# Patient Record
Sex: Female | Born: 1935 | Race: White | Hispanic: No | Smoking: Never smoker
Health system: Southern US, Community
[De-identification: ages and names within clinical notes are randomized; demographics above are authoritative.]

## PROBLEM LIST (undated history)

## (undated) DIAGNOSIS — I5022 Chronic systolic (congestive) heart failure: Secondary | ICD-10-CM

## (undated) DIAGNOSIS — N183 Chronic kidney disease, stage 3 unspecified: Secondary | ICD-10-CM

## (undated) DIAGNOSIS — J45909 Unspecified asthma, uncomplicated: Secondary | ICD-10-CM

## (undated) DIAGNOSIS — I6529 Occlusion and stenosis of unspecified carotid artery: Secondary | ICD-10-CM

## (undated) DIAGNOSIS — I214 Non-ST elevation (NSTEMI) myocardial infarction: Secondary | ICD-10-CM

## (undated) DIAGNOSIS — Z91041 Radiographic dye allergy status: Secondary | ICD-10-CM

## (undated) DIAGNOSIS — K219 Gastro-esophageal reflux disease without esophagitis: Secondary | ICD-10-CM

## (undated) DIAGNOSIS — E039 Hypothyroidism, unspecified: Secondary | ICD-10-CM

## (undated) DIAGNOSIS — R9431 Abnormal electrocardiogram [ECG] [EKG]: Secondary | ICD-10-CM

## (undated) DIAGNOSIS — E119 Type 2 diabetes mellitus without complications: Secondary | ICD-10-CM

## (undated) DIAGNOSIS — I10 Essential (primary) hypertension: Secondary | ICD-10-CM

## (undated) DIAGNOSIS — E785 Hyperlipidemia, unspecified: Secondary | ICD-10-CM

## (undated) DIAGNOSIS — I2510 Atherosclerotic heart disease of native coronary artery without angina pectoris: Secondary | ICD-10-CM

## (undated) DIAGNOSIS — Z9109 Other allergy status, other than to drugs and biological substances: Secondary | ICD-10-CM

## (undated) DIAGNOSIS — H9190 Unspecified hearing loss, unspecified ear: Secondary | ICD-10-CM

## (undated) DIAGNOSIS — Z9861 Coronary angioplasty status: Secondary | ICD-10-CM

## (undated) DIAGNOSIS — I447 Left bundle-branch block, unspecified: Secondary | ICD-10-CM

## (undated) DIAGNOSIS — M109 Gout, unspecified: Secondary | ICD-10-CM

## (undated) DIAGNOSIS — D649 Anemia, unspecified: Secondary | ICD-10-CM

## (undated) DIAGNOSIS — R001 Bradycardia, unspecified: Secondary | ICD-10-CM

## (undated) DIAGNOSIS — G459 Transient cerebral ischemic attack, unspecified: Secondary | ICD-10-CM

## (undated) HISTORY — DX: Essential (primary) hypertension: I10

## (undated) HISTORY — DX: Occlusion and stenosis of unspecified carotid artery: I65.29

## (undated) HISTORY — PX: ABDOMINAL HYSTERECTOMY: SHX81

## (undated) HISTORY — DX: Unspecified asthma, uncomplicated: J45.909

## (undated) HISTORY — DX: Hyperlipidemia, unspecified: E78.5

## (undated) HISTORY — PX: REDUCTION MAMMAPLASTY: SUR839

## (undated) HISTORY — DX: Gout, unspecified: M10.9

## (undated) HISTORY — PX: SHOULDER OPEN ROTATOR CUFF REPAIR: SHX2407

## (undated) HISTORY — PX: JOINT REPLACEMENT: SHX530

---

## 1957-06-25 HISTORY — PX: APPENDECTOMY: SHX54

## 1987-06-26 HISTORY — PX: CHOLECYSTECTOMY: SHX55

## 1989-02-23 DIAGNOSIS — G459 Transient cerebral ischemic attack, unspecified: Secondary | ICD-10-CM

## 1989-02-23 HISTORY — DX: Transient cerebral ischemic attack, unspecified: G45.9

## 2000-07-22 ENCOUNTER — Encounter: Payer: Self-pay | Admitting: Orthopedic Surgery

## 2000-07-31 ENCOUNTER — Inpatient Hospital Stay (HOSPITAL_COMMUNITY): Admission: RE | Admit: 2000-07-31 | Discharge: 2000-08-01 | Payer: Self-pay | Admitting: Orthopedic Surgery

## 2007-06-26 HISTORY — PX: TOTAL KNEE ARTHROPLASTY: SHX125

## 2008-07-26 ENCOUNTER — Inpatient Hospital Stay (HOSPITAL_COMMUNITY): Admission: RE | Admit: 2008-07-26 | Discharge: 2008-07-29 | Payer: Self-pay | Admitting: Orthopedic Surgery

## 2010-10-09 LAB — TYPE AND SCREEN: ABO/RH(D): O NEG

## 2010-10-09 LAB — CBC
HCT: 36.1 % (ref 36.0–46.0)
Hemoglobin: 11.8 g/dL — ABNORMAL LOW (ref 12.0–15.0)
MCHC: 32.5 g/dL (ref 30.0–36.0)
MCV: 92 fL (ref 78.0–100.0)
Platelets: 222 10*3/uL (ref 150–400)
RBC: 3.93 MIL/uL (ref 3.87–5.11)
RDW: 13.6 % (ref 11.5–15.5)
WBC: 6.4 10*3/uL (ref 4.0–10.5)

## 2010-10-09 LAB — URINALYSIS, ROUTINE W REFLEX MICROSCOPIC
Glucose, UA: NEGATIVE mg/dL
Ketones, ur: NEGATIVE mg/dL
Nitrite: NEGATIVE
Protein, ur: NEGATIVE mg/dL
pH: 5.5 (ref 5.0–8.0)

## 2010-10-09 LAB — COMPREHENSIVE METABOLIC PANEL
ALT: 18 U/L (ref 0–35)
AST: 25 U/L (ref 0–37)
Albumin: 4 g/dL (ref 3.5–5.2)
Alkaline Phosphatase: 96 U/L (ref 39–117)
BUN: 19 mg/dL (ref 6–23)
CO2: 23 mEq/L (ref 19–32)
Calcium: 9.7 mg/dL (ref 8.4–10.5)
Chloride: 105 mEq/L (ref 96–112)
Creatinine, Ser: 0.72 mg/dL (ref 0.4–1.2)
GFR calc Af Amer: >60 mL/min (ref >60)
GFR calc non Af Amer: >60 mL/min (ref >60)
Glucose, Bld: 71 mg/dL (ref 70–99)
Potassium: 4.2 mEq/L (ref 3.5–5.1)
Sodium: 139 mEq/L (ref 135–145)
Total Bilirubin: 0.5 mg/dL (ref 0.3–1.2)
Total Protein: 6.7 g/dL (ref 6.0–8.3)

## 2010-10-09 LAB — DIFFERENTIAL
Basophils Absolute: 0 10*3/uL (ref 0.0–0.1)
Basophils Relative: 0 % (ref 0–1)
Eosinophils Absolute: 0.1 10*3/uL (ref 0.0–0.7)
Eosinophils Relative: 1 % (ref 0–5)
Lymphocytes Relative: 29 % (ref 12–46)
Lymphs Abs: 1.9 10*3/uL (ref 0.7–4.0)
Monocytes Absolute: 0.5 10*3/uL (ref 0.1–1.0)
Monocytes Relative: 8 % (ref 3–12)
Neutro Abs: 4 10*3/uL (ref 1.7–7.7)
Neutrophils Relative %: 62 % (ref 43–77)

## 2010-10-09 LAB — APTT: aPTT: 29 seconds (ref 24–37)

## 2010-10-09 LAB — PROTIME-INR
INR: 0.9 (ref 0.00–1.49)
Prothrombin Time: 12.5 seconds (ref 11.6–15.2)

## 2010-10-10 LAB — GLUCOSE, CAPILLARY
Glucose-Capillary: 172 mg/dL — ABNORMAL HIGH (ref 70–99)
Glucose-Capillary: 177 mg/dL — ABNORMAL HIGH (ref 70–99)
Glucose-Capillary: 182 mg/dL — ABNORMAL HIGH (ref 70–99)
Glucose-Capillary: 190 mg/dL — ABNORMAL HIGH (ref 70–99)
Glucose-Capillary: 211 mg/dL — ABNORMAL HIGH (ref 70–99)
Glucose-Capillary: 217 mg/dL — ABNORMAL HIGH (ref 70–99)
Glucose-Capillary: 219 mg/dL — ABNORMAL HIGH (ref 70–99)
Glucose-Capillary: 244 mg/dL — ABNORMAL HIGH (ref 70–99)

## 2010-10-10 LAB — CBC
HCT: 28.1 % — ABNORMAL LOW (ref 36.0–46.0)
Hemoglobin: 9.9 g/dL — ABNORMAL LOW (ref 12.0–15.0)
MCV: 90.8 fL (ref 78.0–100.0)
Platelets: 167 10*3/uL (ref 150–400)
Platelets: 181 10*3/uL (ref 150–400)
RBC: 3.06 MIL/uL — ABNORMAL LOW (ref 3.87–5.11)
RBC: 3.12 MIL/uL — ABNORMAL LOW (ref 3.87–5.11)
RBC: 3.19 MIL/uL — ABNORMAL LOW (ref 3.87–5.11)
RDW: 13.4 % (ref 11.5–15.5)
WBC: 6.5 10*3/uL (ref 4.0–10.5)
WBC: 7 10*3/uL (ref 4.0–10.5)
WBC: 8 10*3/uL (ref 4.0–10.5)

## 2010-10-10 LAB — BASIC METABOLIC PANEL
BUN: 10 mg/dL (ref 6–23)
BUN: 11 mg/dL (ref 6–23)
Calcium: 8.6 mg/dL (ref 8.4–10.5)
Calcium: 8.7 mg/dL (ref 8.4–10.5)
Chloride: 106 mEq/L (ref 96–112)
Creatinine, Ser: 0.65 mg/dL (ref 0.4–1.2)
Creatinine, Ser: 0.69 mg/dL (ref 0.4–1.2)
GFR calc Af Amer: >60 mL/min (ref >60)
GFR calc Af Amer: >60 mL/min (ref >60)
GFR calc Af Amer: >60 mL/min (ref >60)
GFR calc non Af Amer: >60 mL/min (ref >60)
GFR calc non Af Amer: >60 mL/min (ref >60)
GFR calc non Af Amer: >60 mL/min (ref >60)
Glucose, Bld: 111 mg/dL — ABNORMAL HIGH (ref 70–99)
Potassium: 4.2 mEq/L (ref 3.5–5.1)
Sodium: 139 mEq/L (ref 135–145)

## 2010-10-10 LAB — CROSSMATCH

## 2010-10-10 LAB — HEMOGLOBIN A1C
Hgb A1c MFr Bld: 7.5 % — ABNORMAL HIGH (ref 4.6–6.1)
Mean Plasma Glucose: 169 mg/dL

## 2010-11-07 NOTE — Op Note (Signed)
Carol [name], Carol [name] NO.:  [id number]   MEDICAL RECORD NO.:  [id number]          PATIENT TYPE:  INP   LOCATION:  5005                         FACILITY:  [facility]   PHYSICIAN:  Estill Bamberg. Ronnie Derby, M.D. DATE OF BIRTH:  18-Apr-1936   DATE OF PROCEDURE:  07/26/2008  DATE OF DISCHARGE:                               OPERATIVE REPORT   SURGEON:  Estill Bamberg. Ronnie Derby, MD   ASSISTANTS:  1. Carlynn Spry, PA-C  2. Lowell Guitar. Mancel Bale, PA-C   ANESTHESIA:  General.   PREOPERATIVE DIAGNOSIS:  Right knee osteoarthritis.   POSTOPERATIVE DIAGNOSIS:  Right knee osteoarthritis.   PROCEDURE:  Right total knee arthroplasty.   INDICATIONS FOR PROCEDURE:  The patient is a 75 year old white female  with failure of conservative measures for osteoarthritis of the right  knee.  Informed consent was obtained.   DESCRIPTION OF PROCEDURE:  The patient was laid supine and administered  general anesthesia and Foley catheter placement.  Preoperative nerve  block was done preoperatively.  The extremity was exsanguinated with the  Esmarch and tourniquet inflated to 350 mmHg after sterile prep and  drape.  A midline incision was made with a #10 blade.  New blade was  used to make a medial parapatellar arthrotomy to perform synovectomy.  Elevated the deep MCL off the medial crest of the tibia but did not  extend down to the pes anserine tendon consistent with a valgus knee.  I  everted the patella and measured 22.5 mm thick.  I reamed down 8.5 mm  and drilled through lug holes with the 32-mm template.  I recreated the  22.5-mm thickness.  Then, with the trial component went into flexion  subluxing the patella laterally.  I used the intramedullary alignment  system on the tibia.  I had to make a perpendicular cut to the anatomic  axis.  I made the cut with a sagittal saw.  I then used intramedullary  system on the distal femur and made a 4 degree valgus cut to the valgus  knee and a deficient  hypoplastic lateral femoral condyle.  I then drew  out the epicondylar axis and posterior condylar angle measured 7  degrees.  I sized to a size E and pinned through the 7 degree external  rotation holes.  I made the anterior-posterior chamfer cuts with a  sagittal saw.  I then removed the pieces down with a cutting block.  I  placed a lamina spreader  in the knee and removed the ACL, PCL, medial  and lateral menisci and posterior condylar osteophytes.  I then used 10-  12 spacer blocks and there was tightness on the lateral side, so I put  the leg in extension and performed pie-crusting of the lateral  structures with a 15 blade and a long handle.  I then obtained  flexion/extension gap balance with 12-mm spacer blocks.  I then finished  the femur with a size E finishing block, finished the tibia with a size  5 tibial tray drilling keel.  I then trialed with an E femur with a 5  tibia, 12 insert  and 32 patella which tracked well.  I removed the trial  components and copiously irrigated.  I cemented the components removing  excess cement allowing the cement to harden in extension.  I then left  Hemovac coming out superolaterally deep to the arthrotomy, pain catheter  coming out superomedially and superficially to the arthrotomy.  Closed  the arthrotomy with a figure-of-eight #1Vicryl sutures after letting the  tourniquet down to 55 minutes and obtaining hemostasis.  I irrigated  again and then closed the arthrotomy with figure-of-eight #1 Vicryl  sutures, 0 Vicryl sutures in the deep soft tissues and a subcuticular 2-  0 Vicryl stitch, then subcuticular 3-0 Vicryl stitch and skin staples.  Dressed with Xeroform dressing sponges, sterile Webril, and TED  stocking.   COMPLICATIONS:  None.   DRAINS:  One Hemovac and one pain catheter.   ESTIMATED BLOOD LOSS:  300 mL.   TOURNIQUET TIME:  55 minutes.           ______________________________  Estill Bamberg Ronnie Derby, M.D.     SDL/MEDQ  D:   07/26/2008  T:  07/26/2008  Job:  [id number]
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
## 2010-11-10 NOTE — Discharge Summary (Signed)
NAMEPAYTIENCE, STRITTMATTER NO.:  [id number]   MEDICAL RECORD NO.:  [id number]          PATIENT TYPE:  INP   LOCATION:  5005                         FACILITY:  [facility]   PHYSICIAN:  Estill Bamberg. Ronnie Derby, M.D. DATE OF BIRTH:  [birth year]   DATE OF ADMISSION:  07/26/2008  DATE OF DISCHARGE:  07/29/2008                               DISCHARGE SUMMARY   ADMISSION DIAGNOSIS:  Right knee osteoarthritis.   DISCHARGE DIAGNOSES:  1. Right knee osteoarthritis.  2. Status post right knee arthroplasty.  3. Acute blood loss anemia, status post surgery.  4. Diabetes.  5. Hypertension.  6. Thyroid.  7. Hyperlipidemia.   PROCEDURE:  Right total knee arthroplasty.   HISTORY:  The patient is a 75 year old female complaining of pain in her  right knee times every day.  The patient states the pain is constant and  severe, and is not interfering with activities of daily living.  Conservative treatment has failed.  Risks and benefits of surgery were  discussed and the patient would like to proceed with the right total  knee arthroplasty.   ALLERGIES:  The patient has no known drug allergies.   MEDICATIONS PRIOR TO ADMISSION:  The patient was on:  1. Armour Thyroid 30 mg daily.  2. Metformin 500 mg daily.  3. Colchicine 0.6 three times a day.  4. ProAir.  5. Glyburide and metformin 5/500 twice a day.  6. Lisinopril and hydrochlorothiazide 20/25 daily.  7. Allopurinol 300 mg daily.  8. Fludrocortisone 15 mcg daily.  9. Fish oil daily.  10.Magnesium daily.  11.Vitamin D.  12.Vitamin E.  13.Vitamin C daily.   HOSPITAL COURSE:  This is a 75 year old female admitted on July 26, 2008, after appropriate laboratory studies were obtained preoperatively  as well as Ancef on-call to the operating room, she was taken to the OR  where she underwent a right TKA.  She tolerated the procedure well and  was taken to the PACU in good condition.  She was started on p.o. pain  medication as well  as IV Dilaudid.  Foley was placed intraoperatively.   On postop day #1, vital signs were stable.  The patient denied chest  pain, short of breath, or calf pain; did complaints of nausea at the  night of surgery, but stated resolving the next morning.  The patient  was started on Lovenox 30 mg subcu q.12 h. at 8:00 a.m.  Consults for  PT, OT, and Care Management were made.  The patient is weightbearing as  tolerated.  CPM was 0-9 degrees 16 hours per day.  Incentive spirometry  teaching was done.   On postop day #2, the patient continued to progress with physical  therapy.  Dressing was changed.  Marcaine pump and Hemovac was  discontinued.  Foley was discontinued.  The patient continued on p.o.  pain medication.  The patient was feeling dizzy with physical therapy  and H and H was 9.2 and 28.1.  She was given 2 units of PRBC.   On postop day #3, the patient continued progressive physical therapy and  was asymptomatic and  was discharged home after Lovenox teaching.   LABORATORY STUDIES:  Upon admission, the patient's white blood cell  count was 6.44, H and H was 11.8 and 36.1, and platelets were 222.  Sodium was 139, potassium was 4.2, chloride was 105, CO2 of 23, glucose  71, BUN was 19, and creatinine is 0.72.  On discharge, the patient's  white blood cell count was 6.5, H and H is 9.9 and 28.9, and platelets  were 144.  Sodium was 139, potassium of 3.5, chloride was 104, CO2 of  28, glucose was 111, BUN was 11, and creatinine was 0.6 a day.   DISCHARGE INSTRUCTIONS:  There are no restrictions to diet.  The patient  is to follow with blue instruction sheet for wound care and increase  activity slowly.  May use a cane or walker.  Weightbearing as tolerated.  No lifting or driving for 6 weeks.  Home Health will start day after the  patient leaves the hospital.  CPM 0-90 degrees for 6-8 hours per day x2  weeks.  The patient is to wear bilateral thigh-high compression  stockings x3  weeks.   DISCHARGE MEDICATION:  The patient was given prescriptions for:  1. Lovenox 40 mg, inject once subcutaneously daily; last dose August 09, 2008.  2. Robaxin 500 mg 1-2 tablets every 6 hours as needed for spasm, #60.  3. Oxycodone 5 mg 1-2 tablets every 4-6 hours as needed for pain, #60.   FOLLOWUP:  The patient will follow up with Dr. Ronnie Derby on August 09, 2008, call for appointment [phone].   CONDITION:  Discharged in improved condition.     ______________________________  Carlynn Spry, PA-C    ______________________________  Estill Bamberg. Ronnie Derby, M.D.    MJ/MEDQ  D:  09/03/2008  T:  09/03/2008  Job:  [id number]

## 2010-11-10 NOTE — Op Note (Signed)
[facility]  Patient:    Carol [name], Carol [name]                     MRN: [id number] Proc. Date: 07/31/00 Adm. Date:  [id number] Disc. Date: [id number] Attending:  Tarri Glenn Page                           Operative Report  PREOPERATIVE DIAGNOSES: 1. Degenerative arthritis acromioclavicular joint. 2. Torn rotator cuff, right shoulder.  POSTOPERATIVE DIAGNOSES: 1. Degenerative arthritis acromioclavicular joint. 2. Torn rotator cuff, right shoulder.  OPERATION: 1. Open resection distal right clavicle. 2. Anterior acromionectomy repair of torn rotator cuff, right shoulder.  SURGEON:  Laurice Record. Aplington, M.D.  ASSISTANT:  Peter Congo, P.A.  ANESTHESIA:  General.  PATHOLOGY AND JUSTIFICATION FOR PROCEDURE:  Painful right shoulder with arthritic changes to the [facility] joint on x-ray and tenderness at this joint on exam.  Also, on MRI, she had retracted tear of the rotator cuff which at surgery was about 2 cm in width with 1-2 cm of retraction, but it was flexible.  DESCRIPTION OF PROCEDURE:  Prophylactic antibiotics, satisfactory general anesthesia, beach chair position on the Schlein frame.  Right shoulder girdle was prepped with DuraPrep and draped in a sterile field.  Ioban employed. Hockey stick type incision with a vertical component up to about the [facility] joint and then curving gently over the distal clavicle.  Incision was carried down the fascia over the distal clavicle, the [facility] joint, and the anterior acromion, all of which was opened in line with the skin incision from the cutting cautery.  The [facility] joint was identified and was markedly arthritic.  I marked off 2 cm of the lateral clavicle and then undermined the clavicle at this point and protecting the underlying structures, used the microsaw to make my osteotomy.  I then grasped the distal clavicle with towel clip and removed it with Bovie dissection.  I then checked to make that no bony spicules  remaining on the resected end and then covered this with bone wax.  I then continued dissection over the anterior acromion, removing fascia at the [facility] joint and then undermined the anterior acromion with a small Cobb elevator with joint fluid coming forth, confirming the tear.  After I had made my initial exposure, I then placed a large Cobb elevator beneath the anterior acromion and marked out my initial site of the anterior acromionectomy with the Bovie and used the microsaw to make the initial cut.  I then removed additional underneath surface acromion to divide a nice subacromial decompression.  The rotator cuff tear was then identified.  The long end of the biceps tendon was intact.  The tear was at the greater tuberosity.  I made a bony trough with 0.25 inch curved osteotome and small rongeur and then made two central drill holes through the lateral bony buttress into the trough and placed my initial #1 Tycron horizontal mattress suture through these too.  I then supplemented this with horizontal mattress sutures both anteriorly and posteriorly, using the original holes but also coming through rotator cuff anteriorly and posteriorly.  With the arm abducted, I was able to secure the three sutures, and this gave a nice, stable repair.  I then oversewed the terminal end of the rotator cuff with multiple interrupted #1 Vicryl, further stabilizing and smoothing down the repair.  I then checked to make sure there was no residual impingement.  The wound was irrigated with sterile saline and infiltrated with 0.5% Marcaine with adrenalin.  Gelfoam was placed in the resected portion of the distal clavicle.  The fascia over the distal clavicle, the defect, and the anterior acromion, as well as the deltoid muscle were all closed with interrupted #1 Vicryl with a nice, tight closure.  The  subcutaneous tissue with 2-0 Vicryl deep, 4-0 Vicryl superficially with Steri-Strips on the skin. Dry sterile  dressing and shoulder immobilizer were applied.  She tolerated the procedure well and was taken to the recovery room in satisfactory condition with no known complications. DD:  07/31/00 TD:  08/01/00 Job: [id number]

## 2011-06-26 HISTORY — PX: CATARACT EXTRACTION W/ INTRAOCULAR LENS  IMPLANT, BILATERAL: SHX1307

## 2012-12-05 DIAGNOSIS — E119 Type 2 diabetes mellitus without complications: Secondary | ICD-10-CM

## 2012-12-05 DIAGNOSIS — H908 Mixed conductive and sensorineural hearing loss, unspecified: Secondary | ICD-10-CM | POA: Insufficient documentation

## 2012-12-05 DIAGNOSIS — R079 Chest pain, unspecified: Secondary | ICD-10-CM

## 2012-12-05 DIAGNOSIS — E875 Hyperkalemia: Secondary | ICD-10-CM | POA: Insufficient documentation

## 2012-12-05 DIAGNOSIS — M545 Low back pain: Secondary | ICD-10-CM | POA: Insufficient documentation

## 2012-12-05 DIAGNOSIS — E039 Hypothyroidism, unspecified: Secondary | ICD-10-CM | POA: Insufficient documentation

## 2012-12-05 DIAGNOSIS — I10 Essential (primary) hypertension: Secondary | ICD-10-CM | POA: Insufficient documentation

## 2012-12-05 DIAGNOSIS — R0602 Shortness of breath: Secondary | ICD-10-CM

## 2012-12-05 DIAGNOSIS — M109 Gout, unspecified: Secondary | ICD-10-CM | POA: Insufficient documentation

## 2012-12-08 ENCOUNTER — Encounter: Payer: Self-pay | Admitting: Cardiovascular Disease

## 2012-12-08 ENCOUNTER — Ambulatory Visit (INDEPENDENT_AMBULATORY_CARE_PROVIDER_SITE_OTHER): Payer: Medicare Other | Admitting: Cardiovascular Disease

## 2012-12-08 VITALS — BP 173/90 | HR 82 | Ht 66.5 in | Wt 175.8 lb

## 2012-12-08 DIAGNOSIS — R079 Chest pain, unspecified: Secondary | ICD-10-CM

## 2012-12-08 NOTE — Patient Instructions (Addendum)
Your physician recommends that you schedule a follow-up appointment in: about 6 weeks.   Your physician has requested that you have an echocardiogram. Echocardiography is a painless test that uses sound waves to create images of your heart. It provides your doctor with information about the size and shape of your heart and how well your heart's chambers and valves are working. This procedure takes approximately one hour. There are no restrictions for this procedure.   Your physician has requested that you have an exercise stress myoview. For further information please visit [email or web address]. Please follow instruction sheet, as given.

## 2012-12-08 NOTE — Progress Notes (Signed)
History of Present Illness: 77 yo female with history of DM, HTN, asthma, hyperthyroidism, HLD who is referred today as a new patient for evaluation of chest pain. 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 Also exposed to smoke but never a primary smoker. She describes chest pressure and dyspnea with walking over last six months. This happens with minimal exertion/walking and resolves with rest. No prior cardiac disease. Normal ABI per report in a cardiology office near [place].   Primary Care Physician: Reinaldo Meeker  Past Medical History  Diagnosis Date  . Hypertension   . Diabetes mellitus without complication   . Asthma   . Hyperthyroidism   . Hyperthyroidism   . Hyperlipidemia   . Gout, unspecified   . Hx of knee surgery   . Chest pain     angina    Past Surgical History  Procedure Laterality Date  . Cholecystectomy    . Rotator cuff repair Bilateral   . Total knee arthroplasty Right   . Cesarean section    . Abdominal hysterectomy      Current Outpatient Prescriptions  Medication Sig Dispense Refill  . allopurinol (ZYLOPRIM) 300 MG tablet Take 300 mg by mouth daily.      Marland Kitchen amLODipine (NORVASC) 5 MG tablet Take 5 mg by mouth daily.      . Biotin 5000 MCG CAPS Take by mouth.      . Cholecalciferol (VITAMIN D-3 PO) Take 2,000 Units by mouth daily.      . colchicine 0.6 MG tablet Take 0.6 mg by mouth 2 (two) times daily.      Marland Kitchen glipiZIDE (GLUCOTROL) 5 MG tablet Take 5 mg by mouth daily. Take one 5mg  tab once a day for 90 days (10/14/2012)      . glucose blood test strip 1 each by Other route daily. Use as instructed      . lisinopril (PRINIVIL,ZESTRIL) 20 MG tablet Take 20 mg by mouth daily.      . metFORMIN (GLUCOPHAGE) 1000 MG tablet Take 1,000 mg by mouth 2 (two) times daily.       . Multiple Vitamins-Minerals (MULTIVITAMIN PO) Take by mouth daily.      . nitroGLYCERIN (NITROSTAT) 0.4 MG SL tablet Place 0.4 mg under  the tongue every 5 (five) minutes as needed for chest pain.      Marland Kitchen thyroid (ARMOUR) 60 MG tablet Take 120 mg by mouth daily. Take two tabs once a day for 90 days (09/04/2012)      . vitamin B-12 (CYANOCOBALAMIN) 1000 MCG tablet Take 1,000 mcg by mouth daily.       No current facility-administered medications for this visit.    Allergies  Allergen Reactions  . Contrast Media (Iodinated Diagnostic Agents)   . Cortizone-10 (Hydrocortisone)     History   Social History  . Marital Status: Married    Spouse Name: N/A    Number of Children: 2  . Years of Education: N/A   Occupational History  . Retired    Social History Main Topics  . Smoking status: Never Smoker   . Smokeless tobacco: Not on file  . Alcohol Use: No  . Drug Use: No  . Sexually Active: No   Other Topics Concern  . Not on file   Social History Narrative  . No narrative on file    Family History  Problem Relation Age of Onset  . Hypertension Mother   .  Diabetes Mother   . Heart attack Mother 23    Review of Systems:  As stated in the HPI and otherwise negative.   BP 173/90  Pulse 82  Ht 5' 6.5" (1.689 m)  Wt 175 lb 12.8 oz (79.742 kg)  BMI 27.95 kg/m2  Physical Examination: General: Well developed, well nourished, NAD HEENT: OP clear, mucus membranes moist SKIN: warm, dry. No rashes. Neuro: No focal deficits Musculoskeletal: Muscle strength 5/5 all ext Psychiatric: Mood and affect normal Neck: No JVD, no carotid bruits, no thyromegaly, no lymphadenopathy. Lungs:Clear bilaterally, no wheezes, rhonci, crackles Cardiovascular: Regular rate and rhythm. No murmurs, gallops or rubs. Abdomen:Soft. Bowel sounds present. Non-tender.  Extremities: No lower extremity edema. Pulses are 2 + in the bilateral DP/PT.  EKG: NSR, rate 79 bpm. Normal EKG  Assessment and Plan:   1. Chest pain: No prior cardiac disease. Risk factors for CAD include age, FH of CAD, DM, HTN, HLD. Concerning for angina.  Will  arrange exercise stress myoview to exclude ischemia. Echo to assess LVEF and exclude valvular disease.

## 2012-12-17 ENCOUNTER — Ambulatory Visit (HOSPITAL_COMMUNITY): Payer: Medicare Other | Attending: Cardiovascular Disease | Admitting: Radiology

## 2012-12-17 DIAGNOSIS — E785 Hyperlipidemia, unspecified: Secondary | ICD-10-CM | POA: Insufficient documentation

## 2012-12-17 DIAGNOSIS — R072 Precordial pain: Secondary | ICD-10-CM | POA: Insufficient documentation

## 2012-12-17 DIAGNOSIS — E119 Type 2 diabetes mellitus without complications: Secondary | ICD-10-CM | POA: Insufficient documentation

## 2012-12-17 DIAGNOSIS — R079 Chest pain, unspecified: Secondary | ICD-10-CM

## 2012-12-17 DIAGNOSIS — I10 Essential (primary) hypertension: Secondary | ICD-10-CM | POA: Insufficient documentation

## 2012-12-17 DIAGNOSIS — I079 Rheumatic tricuspid valve disease, unspecified: Secondary | ICD-10-CM | POA: Insufficient documentation

## 2012-12-17 DIAGNOSIS — I059 Rheumatic mitral valve disease, unspecified: Secondary | ICD-10-CM | POA: Insufficient documentation

## 2012-12-17 DIAGNOSIS — J45909 Unspecified asthma, uncomplicated: Secondary | ICD-10-CM | POA: Insufficient documentation

## 2012-12-17 NOTE — Progress Notes (Signed)
Echo Report Faxed to Dr. Reinaldo Meeker

## 2012-12-17 NOTE — Progress Notes (Signed)
Echocardiogram performed.  

## 2012-12-22 ENCOUNTER — Encounter (HOSPITAL_COMMUNITY): Payer: Medicare Other

## 2012-12-24 ENCOUNTER — Ambulatory Visit (HOSPITAL_COMMUNITY): Payer: Medicare Other | Attending: Cardiology | Admitting: Radiology

## 2012-12-24 VITALS — BP 155/83 | HR 79 | Ht 65.0 in | Wt 196.0 lb

## 2012-12-24 DIAGNOSIS — R5383 Other fatigue: Secondary | ICD-10-CM

## 2012-12-24 DIAGNOSIS — R079 Chest pain, unspecified: Secondary | ICD-10-CM

## 2012-12-24 DIAGNOSIS — R0602 Shortness of breath: Secondary | ICD-10-CM | POA: Insufficient documentation

## 2012-12-24 DIAGNOSIS — R5381 Other malaise: Secondary | ICD-10-CM | POA: Insufficient documentation

## 2012-12-24 DIAGNOSIS — R0609 Other forms of dyspnea: Secondary | ICD-10-CM | POA: Insufficient documentation

## 2012-12-24 DIAGNOSIS — I10 Essential (primary) hypertension: Secondary | ICD-10-CM | POA: Insufficient documentation

## 2012-12-24 DIAGNOSIS — E785 Hyperlipidemia, unspecified: Secondary | ICD-10-CM | POA: Insufficient documentation

## 2012-12-24 DIAGNOSIS — Z8249 Family history of ischemic heart disease and other diseases of the circulatory system: Secondary | ICD-10-CM | POA: Insufficient documentation

## 2012-12-24 DIAGNOSIS — R51 Headache: Secondary | ICD-10-CM

## 2012-12-24 DIAGNOSIS — E119 Type 2 diabetes mellitus without complications: Secondary | ICD-10-CM | POA: Insufficient documentation

## 2012-12-24 DIAGNOSIS — R0789 Other chest pain: Secondary | ICD-10-CM | POA: Insufficient documentation

## 2012-12-24 DIAGNOSIS — R61 Generalized hyperhidrosis: Secondary | ICD-10-CM | POA: Insufficient documentation

## 2012-12-24 DIAGNOSIS — R0989 Other specified symptoms and signs involving the circulatory and respiratory systems: Secondary | ICD-10-CM | POA: Insufficient documentation

## 2012-12-24 MED ORDER — TECHNETIUM TC 99M SESTAMIBI GENERIC - CARDIOLITE
10.0000 | Freq: Once | INTRAVENOUS | Status: AC | PRN
  Administered 2012-12-24: 10 via INTRAVENOUS

## 2012-12-24 MED ORDER — TECHNETIUM TC 99M SESTAMIBI GENERIC - CARDIOLITE
30.0000 | Freq: Once | INTRAVENOUS | Status: AC | PRN
  Administered 2012-12-24: 30 via INTRAVENOUS

## 2012-12-24 MED ORDER — AMINOPHYLLINE 25 MG/ML IV SOLN
75.0000 mg | Freq: Once | INTRAVENOUS | Status: AC
  Administered 2012-12-24: 75 mg via INTRAVENOUS

## 2012-12-24 MED ORDER — REGADENOSON 0.4 MG/5ML IV SOLN
0.4000 mg | Freq: Once | INTRAVENOUS | Status: AC
  Administered 2012-12-24: 0.4 mg via INTRAVENOUS

## 2012-12-24 NOTE — Progress Notes (Signed)
[facility] [place], [place] [address] [phone]    Cardiology Nuclear Med Study  Carol [name] is a 77 y.o. female     MRN : [id number]     DOB: [birth year]  Procedure Date: 12/24/2012  Nuclear Med Background Indication for Stress Test:  Evaluation for Ischemia History: No prior known history of CAD, 6-7 yrs ago GXT: Normal per patient; 11-2012 Echo: EF=55-60% Cardiac Risk Factors: Family History - CAD, Hypertension, Lipids and NIDDM  Symptoms: Chest Pain/Pressure with/without exertion (last occurrence 1 month ago),  Diaphoresis, DOE, Fatigue, Fatigue with Exertion and SOB   Nuclear Pre-Procedure Caffeine/Decaff Intake:  None NPO After: 7:00am   Lungs:  clear O2 Sat: 97% on room air. IV 0.9% NS with Angio Cath:  22g  IV Site: R Hand  IV Started by:  Crissie Figures, RN  Chest Size (in):  44 Cup Size: D  Height: 5\' 5"  (1.651 m)  Weight:  196 lb (88.905 kg)  BMI:  Body mass index is 32.62 kg/(m^2). Tech Comments:  N/A    Nuclear Med Study 1 or 2 day study: 1 day  Stress Test Type:  Lexiscan  Reading MD: [name], MD  Order Authorizing Provider:  Lauree Chandler, MD  Resting Radionuclide: Technetium 57m Sestamibi  Resting Radionuclide Dose: 11.0 mCi   Stress Radionuclide:  Technetium 68m Sestamibi  Stress Radionuclide Dose: 33.0 mCi           Stress Protocol Rest HR: 79 Stress HR: 108  Rest BP: 155/83 Stress BP: 207/66  Exercise Time (min): n/a METS: n/a   Predicted Max HR: 146 bpm % Max HR: 73.97 bpm Rate Pressure Product: 22356   Dose of Adenosine (mg):  n/a Dose of Lexiscan: 0.4 mg  Dose of Atropine (mg): n/a Dose of Dobutamine: n/a mcg/kg/min (at max HR)  Stress Test Technologist: Irven Baltimore, RN  Nuclear Technologist:  Vedia Pereyra, CNMT     Rest Procedure:  Myocardial perfusion imaging was performed at rest 45 minutes following the intravenous administration of Technetium 53m Sestamibi. Rest ECG: NSR  - Normal EKG  Stress Procedure:  The patient received IV Lexiscan 0.4 mg over 15-seconds.  Technetium 85m Sestamibi injected at 30-seconds. The patient complained of chest pain, SOB, exhausted, and headache with Lexiscan. Aminophylline 75 mg IVP given 7 minutes in recovery with resolution of symptoms.  Quantitative spect images were obtained after a 45 minute delay. Stress ECG: No significant change from baseline ECG  QPS Raw Data Images:  Normal; no motion artifact; normal heart/lung ratio. Stress Images:  Normal homogeneous uptake in all areas of the myocardium. Rest Images:  Normal homogeneous uptake in all areas of the myocardium. Subtraction (SDS):  There is no evidence of scar or ischemia. Transient Ischemic Dilatation (Normal <1.22):  NA Lung/Heart Ratio (Normal <0.45):  0.33  Quantitative Gated Spect Images QGS EDV:  73 ml QGS ESV:  19 ml  Impression Exercise Capacity:  Lexiscan with no exercise. BP Response:  Hypertensive blood pressure response. Clinical Symptoms:  Shortness of breath.  ECG Impression:  No significant ST segment change suggestive of ischemia. Comparison with Prior Nuclear Study: No previous nuclear study performed  Overall Impression:  Normal stress nuclear study.  LV Ejection Fraction: 75%.  LV Wall Motion:  NL LV Function; NL Wall Motion  [name] 12/24/2012

## 2013-01-21 ENCOUNTER — Ambulatory Visit (INDEPENDENT_AMBULATORY_CARE_PROVIDER_SITE_OTHER): Payer: Medicare Other | Admitting: Cardiovascular Disease

## 2013-01-21 ENCOUNTER — Encounter: Payer: Self-pay | Admitting: Cardiovascular Disease

## 2013-01-21 VITALS — BP 162/88 | HR 88 | Ht 67.5 in | Wt 199.4 lb

## 2013-01-21 DIAGNOSIS — R079 Chest pain, unspecified: Secondary | ICD-10-CM

## 2013-01-21 NOTE — Progress Notes (Signed)
History of Present Illness: 77 yo female with history of DM, HTN, asthma, hyperthyroidism, HLD who is here today for cardiac follow up. I saw her June 2014 as a new patient for evaluation of chest pain. 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 Also exposed to smoke but never a primary smoker. She described chest pressure and dyspnea with walking over last six months. This happens with minimal exertion/walking and resolves with rest. No prior cardiac disease. Normal ABI per report in a cardiology office near [place]. I arranged a stress myoview 12/24/12 which showed normal LV function and no evidence of ischemia. Echo 12/17/12 overall normal.   She is here today for follow up. She has no exertional chest pain. She continues to have cough, burning in her epigastrium with deep breaths.   Primary Care Physician: Reinaldo Meeker  Past Medical History  Diagnosis Date  . Hypertension   . Diabetes mellitus without complication   . Asthma   . Hyperthyroidism   . Hyperthyroidism   . Hyperlipidemia   . Gout, unspecified   . Hx of knee surgery   . Chest pain     angina    Past Surgical History  Procedure Laterality Date  . Cholecystectomy    . Rotator cuff repair Bilateral   . Total knee arthroplasty Right   . Cesarean section    . Abdominal hysterectomy      Current Outpatient Prescriptions  Medication Sig Dispense Refill  . allopurinol (ZYLOPRIM) 300 MG tablet Take 300 mg by mouth daily.      Marland Kitchen amLODipine (NORVASC) 5 MG tablet Take 5 mg by mouth daily.      . Biotin 5000 MCG CAPS Take by mouth.      . Cholecalciferol (VITAMIN D-3 PO) Take 2,000 Units by mouth daily.      . colchicine 0.6 MG tablet Take 0.6 mg by mouth 2 (two) times daily.      Marland Kitchen glipiZIDE (GLUCOTROL) 5 MG tablet Take 5 mg by mouth daily. Take one 5mg  tab once a day for 90 days (10/14/2012)      . glucose blood test strip 1 each by Other route daily. Use as instructed       . lisinopril (PRINIVIL,ZESTRIL) 20 MG tablet Take 20 mg by mouth daily.      . metFORMIN (GLUCOPHAGE) 1000 MG tablet Take 1,000 mg by mouth 2 (two) times daily.       . Multiple Vitamins-Minerals (MULTIVITAMIN PO) Take by mouth daily.      . nitroGLYCERIN (NITROSTAT) 0.4 MG SL tablet Place 0.4 mg under the tongue every 5 (five) minutes as needed for chest pain.      Marland Kitchen thyroid (ARMOUR) 60 MG tablet Take 120 mg by mouth daily. Take two tabs once a day for 90 days (09/04/2012)      . vitamin B-12 (CYANOCOBALAMIN) 1000 MCG tablet Take 1,000 mcg by mouth daily.       No current facility-administered medications for this visit.    Allergies  Allergen Reactions  . Contrast Media (Iodinated Diagnostic Agents)   . Cortizone-10 (Hydrocortisone)     History   Social History  . Marital Status: Married    Spouse Name: N/A    Number of Children: 2  . Years of Education: N/A   Occupational History  . Retired    Social History Main Topics  . Smoking status: Never Smoker   .  Smokeless tobacco: Not on file  . Alcohol Use: No  . Drug Use: No  . Sexually Active: No   Other Topics Concern  . Not on file   Social History Narrative  . No narrative on file    Family History  Problem Relation Age of Onset  . Hypertension Mother   . Diabetes Mother   . Heart attack Mother 17    Review of Systems:  As stated in the HPI and otherwise negative.   BP 162/88  Pulse 88  Ht 5' 7.5" (1.715 m)  Wt 199 lb 6.4 oz (90.447 kg)  BMI 30.75 kg/m2  Physical Examination: General: Well developed, well nourished, NAD HEENT: OP clear, mucus membranes moist SKIN: warm, dry. No rashes. Neuro: No focal deficits Musculoskeletal: Muscle strength 5/5 all ext Psychiatric: Mood and affect normal Neck: No JVD, no carotid bruits, no thyromegaly, no lymphadenopathy. Lungs:Clear bilaterally, no wheezes, rhonci, crackles Cardiovascular: Regular rate and rhythm. No murmurs, gallops or rubs. Abdomen:Soft. Bowel  sounds present. Non-tender.  Extremities: No lower extremity edema. Pulses are 2 + in the bilateral DP/PT.  Stress myoview 12/24/12: Stress Procedure: The patient received IV Lexiscan 0.4 mg over 15-seconds. Technetium 52m Sestamibi injected at 30-seconds. The patient complained of chest pain, SOB, exhausted, and headache with Lexiscan. Aminophylline 75 mg IVP given 7 minutes in recovery with resolution of symptoms.  Quantitative spect images were obtained after a 45 minute delay.  Stress ECG: No significant change from baseline ECG  QPS  Raw Data Images: Normal; no motion artifact; normal heart/lung ratio.  Stress Images: Normal homogeneous uptake in all areas of the myocardium.  Rest Images: Normal homogeneous uptake in all areas of the myocardium.  Subtraction (SDS): There is no evidence of scar or ischemia.  Transient Ischemic Dilatation (Normal <1.22): NA  Lung/Heart Ratio (Normal <0.45): 0.33  Quantitative Gated Spect Images  QGS EDV: 73 ml  QGS ESV: 19 ml  Impression  Exercise Capacity: Lexiscan with no exercise.  BP Response: Hypertensive blood pressure response.  Clinical Symptoms: Shortness of breath.  ECG Impression: No significant ST segment change suggestive of ischemia.  Comparison with Prior Nuclear Study: No previous nuclear study performed  Overall Impression: Normal stress nuclear study.  LV Ejection Fraction: 75%. LV Wall Motion: NL LV Function; NL Wall Motion  Echo 12/17/12: Left ventricle: The cavity size was normal. Systolic function was normal. The estimated ejection fraction was in the range of 55% to 60%. Wall motion was normal; there were no regional wall motion abnormalities. - Left atrium: The atrium was mildly dilated. - Atrial septum: No defect or patent foramen ovale was identified.  Assessment and Plan:   1. Chest pain: Atypical. Does not sound cardiac. Stress test without ischemia as above. I have given her the names of several local pulmonary  specialists if she chooses to seek a pulmonary opinion to her issues.

## 2013-01-21 NOTE — Patient Instructions (Addendum)
Your physician recommends that you schedule a follow-up appointment as needed with Dr. McAlhany   

## 2013-12-23 HISTORY — PX: CORONARY ANGIOPLASTY: SHX604

## 2014-01-22 ENCOUNTER — Emergency Department (HOSPITAL_COMMUNITY): Payer: Medicare Other

## 2014-01-22 ENCOUNTER — Encounter (HOSPITAL_COMMUNITY): Payer: Self-pay | Admitting: Emergency Medicine

## 2014-01-22 ENCOUNTER — Inpatient Hospital Stay (HOSPITAL_COMMUNITY)
Admission: EM | Admit: 2014-01-22 | Discharge: 2014-01-25 | DRG: 250 | Disposition: A | Discharge status: Home / Self Care | Payer: Medicare Other | Attending: Cardiology | Admitting: Cardiology

## 2014-01-22 ENCOUNTER — Encounter (HOSPITAL_COMMUNITY)
Admission: EM | Disposition: A | Discharge status: Home / Self Care | Payer: Medicare Other | Source: Home / Self Care | Attending: Cardiology

## 2014-01-22 DIAGNOSIS — N179 Acute kidney failure, unspecified: Secondary | ICD-10-CM

## 2014-01-22 DIAGNOSIS — I2510 Atherosclerotic heart disease of native coronary artery without angina pectoris: Secondary | ICD-10-CM

## 2014-01-22 DIAGNOSIS — I5023 Acute on chronic systolic (congestive) heart failure: Secondary | ICD-10-CM | POA: Diagnosis present

## 2014-01-22 DIAGNOSIS — Z8249 Family history of ischemic heart disease and other diseases of the circulatory system: Secondary | ICD-10-CM | POA: Diagnosis not present

## 2014-01-22 DIAGNOSIS — J45909 Unspecified asthma, uncomplicated: Secondary | ICD-10-CM | POA: Diagnosis present

## 2014-01-22 DIAGNOSIS — E785 Hyperlipidemia, unspecified: Secondary | ICD-10-CM | POA: Diagnosis present

## 2014-01-22 DIAGNOSIS — I252 Old myocardial infarction: Secondary | ICD-10-CM

## 2014-01-22 DIAGNOSIS — I2089 Other forms of angina pectoris: Secondary | ICD-10-CM

## 2014-01-22 DIAGNOSIS — E039 Hypothyroidism, unspecified: Secondary | ICD-10-CM | POA: Diagnosis present

## 2014-01-22 DIAGNOSIS — Z91041 Radiographic dye allergy status: Secondary | ICD-10-CM

## 2014-01-22 DIAGNOSIS — I10 Essential (primary) hypertension: Secondary | ICD-10-CM | POA: Diagnosis present

## 2014-01-22 DIAGNOSIS — I208 Other forms of angina pectoris: Secondary | ICD-10-CM

## 2014-01-22 DIAGNOSIS — R0789 Other chest pain: Secondary | ICD-10-CM | POA: Diagnosis present

## 2014-01-22 DIAGNOSIS — I214 Non-ST elevation (NSTEMI) myocardial infarction: Secondary | ICD-10-CM | POA: Diagnosis present

## 2014-01-22 DIAGNOSIS — I25119 Atherosclerotic heart disease of native coronary artery with unspecified angina pectoris: Secondary | ICD-10-CM | POA: Diagnosis present

## 2014-01-22 DIAGNOSIS — I5031 Acute diastolic (congestive) heart failure: Secondary | ICD-10-CM

## 2014-01-22 DIAGNOSIS — Z888 Allergy status to other drugs, medicaments and biological substances status: Secondary | ICD-10-CM

## 2014-01-22 DIAGNOSIS — Z96659 Presence of unspecified artificial knee joint: Secondary | ICD-10-CM

## 2014-01-22 DIAGNOSIS — I209 Angina pectoris, unspecified: Secondary | ICD-10-CM

## 2014-01-22 DIAGNOSIS — Z9861 Coronary angioplasty status: Secondary | ICD-10-CM

## 2014-01-22 DIAGNOSIS — R079 Chest pain, unspecified: Secondary | ICD-10-CM | POA: Diagnosis present

## 2014-01-22 DIAGNOSIS — M109 Gout, unspecified: Secondary | ICD-10-CM | POA: Diagnosis present

## 2014-01-22 DIAGNOSIS — I5021 Acute systolic (congestive) heart failure: Secondary | ICD-10-CM

## 2014-01-22 DIAGNOSIS — I509 Heart failure, unspecified: Secondary | ICD-10-CM | POA: Diagnosis present

## 2014-01-22 DIAGNOSIS — Z833 Family history of diabetes mellitus: Secondary | ICD-10-CM

## 2014-01-22 DIAGNOSIS — E119 Type 2 diabetes mellitus without complications: Secondary | ICD-10-CM | POA: Diagnosis present

## 2014-01-22 DIAGNOSIS — I25709 Atherosclerosis of coronary artery bypass graft(s), unspecified, with unspecified angina pectoris: Secondary | ICD-10-CM | POA: Diagnosis present

## 2014-01-22 HISTORY — DX: Chronic systolic (congestive) heart failure: I50.22

## 2014-01-22 HISTORY — DX: Gastro-esophageal reflux disease without esophagitis: K21.9

## 2014-01-22 HISTORY — DX: Other allergy status, other than to drugs and biological substances: Z91.09

## 2014-01-22 HISTORY — DX: Non-ST elevation (NSTEMI) myocardial infarction: I21.4

## 2014-01-22 HISTORY — DX: Type 2 diabetes mellitus without complications: E11.9

## 2014-01-22 HISTORY — PX: LEFT HEART CATHETERIZATION WITH CORONARY ANGIOGRAM: SHX5451

## 2014-01-22 HISTORY — DX: Atherosclerotic heart disease of native coronary artery without angina pectoris: I25.10

## 2014-01-22 LAB — CBC WITH DIFFERENTIAL/PLATELET
BASOS PCT: 0 % (ref 0–1)
Basophils Absolute: 0 10*3/uL (ref 0.0–0.1)
EOS ABS: 0.1 10*3/uL (ref 0.0–0.7)
Eosinophils Relative: 1 % (ref 0–5)
HCT: 39.7 % (ref 36.0–46.0)
HEMOGLOBIN: 12.7 g/dL (ref 12.0–15.0)
LYMPHS ABS: 2 10*3/uL (ref 0.7–4.0)
Lymphocytes Relative: 22 % (ref 12–46)
MCH: 28.5 pg (ref 26.0–34.0)
MCHC: 32 g/dL (ref 30.0–36.0)
MCV: 89.2 fL (ref 78.0–100.0)
MONOS PCT: 11 % (ref 3–12)
Monocytes Absolute: 0.9 10*3/uL (ref 0.1–1.0)
NEUTROS ABS: 5.8 10*3/uL (ref 1.7–7.7)
NEUTROS PCT: 66 % (ref 43–77)
PLATELETS: 290 10*3/uL (ref 150–400)
RBC: 4.45 MIL/uL (ref 3.87–5.11)
RDW: 15.2 % (ref 11.5–15.5)
WBC: 8.8 10*3/uL (ref 4.0–10.5)

## 2014-01-22 LAB — I-STAT TROPONIN, ED
TROPONIN I, POC: 0.05 ng/mL (ref 0.00–0.08)
TROPONIN I, POC: 0.28 ng/mL — AB (ref 0.00–0.08)

## 2014-01-22 LAB — BASIC METABOLIC PANEL
Anion gap: 18 — ABNORMAL HIGH (ref 5–15)
BUN: 16 mg/dL (ref 6–23)
CHLORIDE: 101 meq/L (ref 96–112)
CO2: 21 meq/L (ref 19–32)
Calcium: 10.1 mg/dL (ref 8.4–10.5)
Creatinine, Ser: 0.77 mg/dL (ref 0.50–1.10)
GFR calc Af Amer: >90 mL/min (ref >90)
GFR calc non Af Amer: 80 mL/min — ABNORMAL LOW (ref >90)
GLUCOSE: 237 mg/dL — AB (ref 70–99)
POTASSIUM: 4.4 meq/L (ref 3.7–5.3)
SODIUM: 140 meq/L (ref 137–147)

## 2014-01-22 LAB — MRSA PCR SCREENING: MRSA by PCR: NEGATIVE

## 2014-01-22 LAB — POCT ACTIVATED CLOTTING TIME: Activated Clotting Time: 608 seconds

## 2014-01-22 LAB — PRO B NATRIURETIC PEPTIDE: PRO B NATRI PEPTIDE: 202.7 pg/mL (ref 0–450)

## 2014-01-22 LAB — PROTIME-INR
INR: 1.29 (ref 0.00–1.49)
PROTHROMBIN TIME: 16.1 s — AB (ref 11.6–15.2)

## 2014-01-22 LAB — APTT: aPTT: 59 seconds — ABNORMAL HIGH (ref 24–37)

## 2014-01-22 LAB — TSH: TSH: 0.564 u[IU]/mL (ref 0.350–4.500)

## 2014-01-22 LAB — MAGNESIUM: Magnesium: 1.8 mg/dL (ref 1.5–2.5)

## 2014-01-22 LAB — TROPONIN I: TROPONIN I: 0.78 ng/mL — AB (ref <0.30)

## 2014-01-22 SURGERY — LEFT HEART CATHETERIZATION WITH CORONARY ANGIOGRAM
Anesthesia: LOCAL

## 2014-01-22 MED ORDER — LIDOCAINE HCL (PF) 1 % IJ SOLN
INTRAMUSCULAR | Status: AC
  Filled 2014-01-22: qty 30

## 2014-01-22 MED ORDER — GLIPIZIDE 5 MG PO TABS
5.0000 mg | ORAL_TABLET | Freq: Every day | ORAL | Status: DC
  Administered 2014-01-23 – 2014-01-25 (×3): 5 mg via ORAL
  Filled 2014-01-22 (×5): qty 1

## 2014-01-22 MED ORDER — ATORVASTATIN CALCIUM 80 MG PO TABS
80.0000 mg | ORAL_TABLET | Freq: Every day | ORAL | Status: DC
  Administered 2014-01-23 – 2014-01-24 (×2): 80 mg via ORAL
  Filled 2014-01-22 (×3): qty 1

## 2014-01-22 MED ORDER — GI COCKTAIL ~~LOC~~
30.0000 mL | Freq: Once | ORAL | Status: AC
  Administered 2014-01-22: 30 mL via ORAL
  Filled 2014-01-22: qty 30

## 2014-01-22 MED ORDER — TIROFIBAN HCL IV 5 MG/100ML
0.1500 ug/kg/min | INTRAVENOUS | Status: AC
  Administered 2014-01-22 – 2014-01-23 (×4): 0.15 ug/kg/min via INTRAVENOUS
  Filled 2014-01-22 (×6): qty 100

## 2014-01-22 MED ORDER — ONDANSETRON HCL 4 MG/2ML IJ SOLN: 4.0000 mg | Freq: Four times a day (QID) | INTRAMUSCULAR | Status: DC | PRN

## 2014-01-22 MED ORDER — TIROFIBAN (AGGRASTAT) BOLUS VIA INFUSION
25.0000 ug/kg | Freq: Once | INTRAVENOUS | Status: AC
  Administered 2014-01-22: 2142.5 ug via INTRAVENOUS
  Filled 2014-01-22: qty 43

## 2014-01-22 MED ORDER — TICAGRELOR 90 MG PO TABS
90.0000 mg | ORAL_TABLET | Freq: Two times a day (BID) | ORAL | Status: DC
  Administered 2014-01-22 – 2014-01-25 (×6): 90 mg via ORAL
  Filled 2014-01-22 (×7): qty 1

## 2014-01-22 MED ORDER — NITROGLYCERIN 0.4 MG SL SUBL: 0.4000 mg | SUBLINGUAL_TABLET | SUBLINGUAL | Status: DC | PRN

## 2014-01-22 MED ORDER — ALLOPURINOL 300 MG PO TABS
300.0000 mg | ORAL_TABLET | Freq: Every day | ORAL | Status: DC
  Administered 2014-01-22 – 2014-01-25 (×4): 300 mg via ORAL
  Filled 2014-01-22 (×4): qty 1

## 2014-01-22 MED ORDER — ZOLPIDEM TARTRATE 5 MG PO TABS
5.0000 mg | ORAL_TABLET | Freq: Every evening | ORAL | Status: DC | PRN
  Administered 2014-01-23: 5 mg via ORAL
  Filled 2014-01-22 (×2): qty 1

## 2014-01-22 MED ORDER — NITROGLYCERIN IN D5W 200-5 MCG/ML-% IV SOLN
5.0000 ug/min | INTRAVENOUS | Status: DC
  Administered 2014-01-22: 5 ug/min via INTRAVENOUS
  Filled 2014-01-22: qty 250

## 2014-01-22 MED ORDER — MIDAZOLAM HCL 2 MG/2ML IJ SOLN
INTRAMUSCULAR | Status: AC
  Filled 2014-01-22: qty 2

## 2014-01-22 MED ORDER — PANTOPRAZOLE SODIUM 40 MG PO TBEC
80.0000 mg | DELAYED_RELEASE_TABLET | Freq: Every day | ORAL | Status: DC
  Administered 2014-01-23 – 2014-01-25 (×3): 80 mg via ORAL
  Filled 2014-01-22 (×3): qty 2

## 2014-01-22 MED ORDER — ACETAMINOPHEN 325 MG PO TABS: 650.0000 mg | ORAL_TABLET | ORAL | Status: DC | PRN

## 2014-01-22 MED ORDER — NITROGLYCERIN 1 MG/10 ML FOR IR/CATH LAB
INTRA_ARTERIAL | Status: AC
  Filled 2014-01-22: qty 10

## 2014-01-22 MED ORDER — ASPIRIN 81 MG PO CHEW: 81.0000 mg | CHEWABLE_TABLET | Freq: Every day | ORAL | Status: DC

## 2014-01-22 MED ORDER — ALBUTEROL SULFATE (2.5 MG/3ML) 0.083% IN NEBU
3.0000 mL | INHALATION_SOLUTION | Freq: Four times a day (QID) | RESPIRATORY_TRACT | Status: DC | PRN
  Administered 2014-01-23: 3 mL via RESPIRATORY_TRACT
  Filled 2014-01-22: qty 3

## 2014-01-22 MED ORDER — SODIUM CHLORIDE 0.9 % IV SOLN
INTRAVENOUS | Status: AC
  Administered 2014-01-22: 20:00:00 via INTRAVENOUS

## 2014-01-22 MED ORDER — HEPARIN (PORCINE) IN NACL 100-0.45 UNIT/ML-% IJ SOLN
900.0000 [IU]/h | INTRAMUSCULAR | Status: DC
  Administered 2014-01-23: 900 [IU]/h via INTRAVENOUS
  Filled 2014-01-22 (×2): qty 250

## 2014-01-22 MED ORDER — ASPIRIN EC 81 MG PO TBEC
81.0000 mg | DELAYED_RELEASE_TABLET | Freq: Every day | ORAL | Status: DC
  Administered 2014-01-23 – 2014-01-25 (×3): 81 mg via ORAL
  Filled 2014-01-22 (×3): qty 1

## 2014-01-22 MED ORDER — THYROID 120 MG PO TABS
120.0000 mg | ORAL_TABLET | Freq: Every day | ORAL | Status: DC
  Administered 2014-01-23 – 2014-01-25 (×3): 120 mg via ORAL
  Filled 2014-01-22 (×5): qty 1

## 2014-01-22 MED ORDER — VERAPAMIL HCL 2.5 MG/ML IV SOLN
INTRAVENOUS | Status: AC
  Filled 2014-01-22: qty 2

## 2014-01-22 MED ORDER — MORPHINE SULFATE 4 MG/ML IJ SOLN
4.0000 mg | Freq: Once | INTRAMUSCULAR | Status: AC
  Administered 2014-01-22: 4 mg via INTRAVENOUS
  Filled 2014-01-22: qty 1

## 2014-01-22 MED ORDER — ASPIRIN 81 MG PO CHEW
324.0000 mg | CHEWABLE_TABLET | ORAL | Status: AC
  Administered 2014-01-22: 324 mg via ORAL

## 2014-01-22 MED ORDER — DIPHENHYDRAMINE HCL 50 MG/ML IJ SOLN
INTRAMUSCULAR | Status: AC
  Filled 2014-01-22: qty 1

## 2014-01-22 MED ORDER — LISINOPRIL 20 MG PO TABS
20.0000 mg | ORAL_TABLET | Freq: Every day | ORAL | Status: DC
  Administered 2014-01-22 – 2014-01-24 (×3): 20 mg via ORAL
  Filled 2014-01-22 (×4): qty 1

## 2014-01-22 MED ORDER — HEPARIN SODIUM (PORCINE) 1000 UNIT/ML IJ SOLN
INTRAMUSCULAR | Status: AC
  Filled 2014-01-22: qty 1

## 2014-01-22 MED ORDER — METOPROLOL TARTRATE 25 MG PO TABS
25.0000 mg | ORAL_TABLET | Freq: Two times a day (BID) | ORAL | Status: DC
  Administered 2014-01-22 – 2014-01-24 (×4): 25 mg via ORAL
  Filled 2014-01-22 (×5): qty 1

## 2014-01-22 MED ORDER — HEPARIN (PORCINE) IN NACL 100-0.45 UNIT/ML-% IJ SOLN
1000.0000 [IU]/h | INTRAMUSCULAR | Status: DC
  Filled 2014-01-22: qty 250

## 2014-01-22 MED ORDER — BIVALIRUDIN 250 MG IV SOLR
INTRAVENOUS | Status: AC
  Filled 2014-01-22: qty 250

## 2014-01-22 MED ORDER — ALPRAZOLAM 0.25 MG PO TABS: 0.2500 mg | ORAL_TABLET | Freq: Two times a day (BID) | ORAL | Status: DC | PRN

## 2014-01-22 MED ORDER — FENTANYL CITRATE 0.05 MG/ML IJ SOLN
INTRAMUSCULAR | Status: AC
  Filled 2014-01-22: qty 2

## 2014-01-22 MED ORDER — CANAGLIFLOZIN 300 MG PO TABS
300.0000 mg | ORAL_TABLET | Freq: Every day | ORAL | Status: DC
  Administered 2014-01-22 – 2014-01-25 (×4): 300 mg via ORAL
  Filled 2014-01-22 (×4): qty 1

## 2014-01-22 MED ORDER — HEPARIN BOLUS VIA INFUSION
3500.0000 [IU] | Freq: Once | INTRAVENOUS | Status: DC
  Filled 2014-01-22: qty 3500

## 2014-01-22 MED ORDER — ASPIRIN 300 MG RE SUPP
300.0000 mg | RECTAL | Status: AC
  Filled 2014-01-22: qty 1

## 2014-01-22 MED ORDER — SODIUM CHLORIDE 0.9 % IV SOLN
INTRAVENOUS | Status: DC
  Administered 2014-01-22: 16:00:00 via INTRAVENOUS

## 2014-01-22 NOTE — ED Notes (Signed)
Cath lab ready for pt.  

## 2014-01-22 NOTE — H&P (Signed)
Carol [name] is an 78 y.o. female.    Primary Cardiologist:Dr. Angelena Form PCP: Lillard Anes, MD  Chief Complaint: chest pain HPI: 78 yo female with history of DM, HTN, asthma, hyperthyroidism, HLD who presents by EMS to ER with chest pain.  She has had increased chest pain for 2 months, progressive, at first with walking to mailbox and now just walking in the house.  It is present with rest and activity but increases with activity.  It has been more intense recently but last night was the worst it has been.  She had been using inhaler with walking without improvement. She had some pain going to bed but did sleep and was awakened from sleep at 1230 with severe "20/10" pain associated with SOB, nausea and significant diaphoresis.  Pain on rt of neck with radiation down to rt chest and across chest to left.  She took NTG and pain improved but did return.  A total of 6 NTG were taken.  She is improved here in ER after morphine.    No acute EKG changes.  CT of her chest was done + for scattered atherosclerotic calcifications aorta and coronary arteries.  Initial troponin POC was negative, second 0.28. CT does show mod hiatal hernia.  Risk factor DM, HTN, HLD no longer on statin due to muscle aches. She does have Hx of TIAs.  Nuc study 12/2012 was normal with EF 75%. Echo 11/2013 Left ventricle: The cavity size was normal. Systolic function was normal. The estimated ejection fraction was in the range of 55% to 60%. Wall motion was normal; there were no regional wall motion abnormalities. - Left atrium: The atrium was mildly dilated. - Atrial septum: No defect or patent foramen ovale was identified.  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 Also exposed to smoke but never a primary smoker. No prior cardiac disease. Normal ABI per report in a cardiology office near [place].    Past Medical History  Diagnosis Date  . Hypertension    . Diabetes mellitus without complication   . Asthma   . Hyperthyroidism   . Hyperthyroidism   . Hyperlipidemia   . Gout, unspecified   . Hx of knee surgery   . Chest pain     angina  . Angina at rest 01/22/2014    Past Surgical History  Procedure Laterality Date  . Cholecystectomy    . Rotator cuff repair Bilateral   . Total knee arthroplasty Right   . Cesarean section    . Abdominal hysterectomy      Family History  Problem Relation Age of Onset  . Hypertension Mother   . Diabetes Mother   . Heart attack Mother 5  . Heart disease Father   . Diabetes Brother   . Heart disease Brother    Social History:  reports that she has never smoked. She does not have any smokeless tobacco history on file. She reports that she does not drink alcohol or use illicit drugs.  Allergies:  Allergies  Allergen Reactions  . Contrast Media [Iodinated Diagnostic Agents]   . Cortizone-10 [Hydrocortisone]   . Zocor [Simvastatin]     Muscle aches- severe    No current facility-administered medications on file prior to encounter.   Current Outpatient Prescriptions on File Prior to Encounter  Medication Sig Dispense Refill  . allopurinol (ZYLOPRIM) 300 MG tablet Take 300 mg by  mouth daily.      Marland Kitchen amLODipine (NORVASC) 5 MG tablet Take 5 mg by mouth daily.      . colchicine 0.6 MG tablet Take 0.6 mg by mouth daily.       Marland Kitchen glipiZIDE (GLUCOTROL) 5 MG tablet Take 5 mg by mouth daily. Take one 48m tab once a day for 90 days (10/14/2012)      . glucose blood test strip 1 each by Other route daily. Use as instructed      . lisinopril (PRINIVIL,ZESTRIL) 20 MG tablet Take 20 mg by mouth daily.      . metFORMIN (GLUCOPHAGE) 1000 MG tablet Take 1,000 mg by mouth 2 (two) times daily.       . nitroGLYCERIN (NITROSTAT) 0.4 MG SL tablet Place 0.4 mg under the tongue every 5 (five) minutes as needed for chest pain.      .Marland Kitchenthyroid (ARMOUR) 60 MG tablet Take 120 mg by mouth daily. Take two tabs once a day  for 90 days (09/04/2012)      Invokana 300 mg also  Results for orders placed during the hospital encounter of 01/22/14 (from the past 48 hour(s))  BASIC METABOLIC PANEL     Status: Abnormal   Collection Time    01/22/14 11:04 AM      Result Value Ref Range   Sodium 140  137 - 147 mEq/L   Potassium 4.4  3.7 - 5.3 mEq/L   Chloride 101  96 - 112 mEq/L   CO2 21  19 - 32 mEq/L   Glucose, Bld 237 (*) 70 - 99 mg/dL   BUN 16  6 - 23 mg/dL   Creatinine, Ser 0.77  0.50 - 1.10 mg/dL   Calcium 10.1  8.4 - 10.5 mg/dL   GFR calc non Af Amer 80 (*) >90 mL/min   GFR calc Af Amer >90  >90 mL/min   Comment: (NOTE)     The eGFR has been calculated using the CKD EPI equation.     This calculation has not been validated in all clinical situations.     eGFR's persistently <90 mL/min signify possible Chronic Kidney     Disease.   Anion gap 18 (*) 5 - 15  TROPONIN I     Status: None   Collection Time    01/22/14 11:04 AM      Result Value Ref Range   Troponin I <0.30  <0.30 ng/mL   Comment:            Due to the release kinetics of cTnI,     a negative result within the first hours     of the onset of symptoms does not rule out     myocardial infarction with certainty.     If myocardial infarction is still suspected,     repeat the test at appropriate intervals.  CBC WITH DIFFERENTIAL     Status: None   Collection Time    01/22/14 11:04 AM      Result Value Ref Range   WBC 8.8  4.0 - 10.5 K/uL   RBC 4.45  3.87 - 5.11 MIL/uL   Hemoglobin 12.7  12.0 - 15.0 g/dL   HCT 39.7  36.0 - 46.0 %   MCV 89.2  78.0 - 100.0 fL   MCH 28.5  26.0 - 34.0 pg   MCHC 32.0  30.0 - 36.0 g/dL   RDW 15.2  11.5 - 15.5 %   Platelets 290  150 -  400 K/uL   Neutrophils Relative % 66  43 - 77 %   Neutro Abs 5.8  1.7 - 7.7 K/uL   Lymphocytes Relative 22  12 - 46 %   Lymphs Abs 2.0  0.7 - 4.0 K/uL   Monocytes Relative 11  3 - 12 %   Monocytes Absolute 0.9  0.1 - 1.0 K/uL   Eosinophils Relative 1  0 - 5 %   Eosinophils  Absolute 0.1  0.0 - 0.7 K/uL   Basophils Relative 0  0 - 1 %   Basophils Absolute 0.0  0.0 - 0.1 K/uL  PRO B NATRIURETIC PEPTIDE     Status: None   Collection Time    01/22/14 11:04 AM      Result Value Ref Range   Pro B Natriuretic peptide (BNP) 202.7  0 - 450 pg/mL  I-STAT TROPOININ, ED     Status: None   Collection Time    01/22/14 11:09 AM      Result Value Ref Range   Troponin i, poc 0.05  0.00 - 0.08 ng/mL   Comment 3            Comment: Due to the release kinetics of cTnI,     a negative result within the first hours     of the onset of symptoms does not rule out     myocardial infarction with certainty.     If myocardial infarction is still suspected,     repeat the test at appropriate intervals.  Randolm Idol, ED     Status: Abnormal   Collection Time    01/22/14  2:45 PM      Result Value Ref Range   Troponin i, poc 0.28 (*) 0.00 - 0.08 ng/mL   Comment NOTIFIED PHYSICIAN     Comment 3            Comment: Due to the release kinetics of cTnI,     a negative result within the first hours     of the onset of symptoms does not rule out     myocardial infarction with certainty.     If myocardial infarction is still suspected,     repeat the test at appropriate intervals.   Ct Chest Wo Contrast  01/22/2014   CLINICAL DATA:  Intermittent chest pain for 1 month worse last night described is squeezing sharp pain associated was shortness of breath, lightheadedness and nausea, diaphoresis, history hypertension, diabetes, gout, asthma  EXAM: CT CHEST WITHOUT CONTRAST  TECHNIQUE: Multidetector CT imaging of the chest was performed following the standard protocol without IV contrast. Sagittal and coronal MPR images reconstructed from axial data set. IV contrast not administered due to history of contrast allergy.  COMPARISON:  05/09/2009 CT chest, chest radiograph 01/22/2014  FINDINGS: Scattered atherosclerotic calcifications aorta and coronary arteries.  No thoracic adenopathy.   Moderate-sized hiatal hernia.  Calcified granulomata within spleen.  Remaining visualized portions of upper abdomen unremarkable.  Suspect tiny LEFT lower lobe tiny calcified granuloma image 23 present on previous exam as well.  Lungs clear.  No infiltrate, pleural effusion or pneumothorax.  Bones unremarkable.  IMPRESSION: Old granulomatous disease.  No acute intra thoracic abnormalities.   Electronically Signed   By: Lavonia Dana M.D.   On: 01/22/2014 13:12   Dg Chest Port 1 View  01/22/2014   CLINICAL DATA:  Chest pain and shortness of breath.  EXAM: PORTABLE CHEST - 1 VIEW  COMPARISON:  12/18/2010  FINDINGS: Lungs  are adequately inflated without consolidation or effusion. Cardiomediastinal silhouette and remainder of the exam is unchanged.  IMPRESSION: No active disease.   Electronically Signed   By: Marin Olp M.D.   On: 01/22/2014 11:16    SHU:OHFGBMS:XJ colds or fevers, no weight changes Skin:no rashes or ulcers HEENT:no blurred vision, no congestion CV:see HPI PUL:see HPI GI:no diarrhea constipation or melena, no indigestion, did not eat anything to increase her GERD GU:no hematuria, no dysuria MS:no joint pain, no claudication, + lowere ext. Wears support stocking in the winter that help. Neuro:near syncope, no lightheadedness Endo:+ diabetes, + thyroid disease   Blood pressure 114/63, pulse 71, temperature 97.9 F (36.6 C), temperature source Oral, resp. rate 17, SpO2 97.00%. PE: General:Pleasant affect, NAD Skin:Warm and dry, brisk capillary refill HEENT:normocephalic, sclera clear, mucus membranes moist Neck:supple, no JVD, no bruits  Heart:S1S2 RRR with soft systolic murmur, no gallup, rub or click, some left chest wall pain as well. Lungs:clear without rales, rhonchi, or wheezes DBZ:MCEYE, soft, non tender, + BS, do not palpate liver spleen or masses Ext:tr-1+ lower ext edema, ++ varicosities  2+ pedal pulses, 2+ radial pulses Neuro:alert and oriented X3 , MAE, follows  commands, + facial symmetry    Assessment/Plan Principal Problem:   Angina at rest- with her risk factors and now slightly elevated troponin may need cath.  Her story sounds anginal but also + chest wall tenderness. Dr. Meda Coffee to see and make plan. Active Problems:   Type II or unspecified type diabetes mellitus without mention of complication, not stated as uncontrolled   Unspecified essential hypertension   Unspecified hypothyroidism   Gout, unspecified   Hyperlipidemia- no longer on zocor.    [place] Nurse Practitioner Certified [place] Pager [phone] or after 5pm or weekends call [phone] 01/22/2014, 3:37 PM   The patient was seen, examined and discussed with Cecilie Kicks, NP and I agree with the above.   78 yo female with history of DM, HTN, asthma, hyperthyroidism, HLD followed in the clinic by Dr Angelena Form. She was worked up a year ago when she first presented with chest pain, nuclear stress test was negative at the time. Echo showed LVEF 55-60%. She presented today with progressively worsening exertional CP over the last two months to the point of resting pain. The pain is associated with SOB, diaphoresis, nausea.   Her troponin is 0 --> 0.28 --> 0.78, ECG is unremarkable. We will start Heparin drip and proceed with a left sided cath for NSTEMI. We will start ASA, atorvastatin (she wasn't at home).  Her  Records indicate contrast allergy, based on her story she has never received iv contrast but sq pain injection (for rotator cuff pain) followed by diaphoresis. She doesn't have h/o anaphylaxis or angioedema.   [name] 01/22/2014

## 2014-01-22 NOTE — ED Notes (Signed)
Dr. Docherty at bedside.

## 2014-01-22 NOTE — ED Notes (Signed)
Pt to ED from home via [facility] c/o shortness of breath and CP since midnight.  Has been having chest pain since 1230am, relieved with Nitro. Pt has taken nitro x 6 since 2 am; took 324 mg ASA. Hx of angina. Cp radiating from right side of neck to heart and left breast, described as squeezing pain, associated with shortness of breath, lightheadedness, and nausea

## 2014-01-22 NOTE — ED Notes (Addendum)
Informed EDP of elevated i-stat trop. Cardiology informed, also at the bedside.

## 2014-01-22 NOTE — Progress Notes (Signed)
ANTICOAGULATION CONSULT NOTE - Initial Consult  Pharmacy Consult for Heparin Indication: chest pain/ACS  Allergies  Allergen Reactions  . Contrast Media [Iodinated Diagnostic Agents]   . Cortizone-10 [Hydrocortisone]   . Zocor [Simvastatin]     Muscle aches- severe    Patient Measurements: Height: 5\' 7"  (170.2 cm) Weight: 189 lb (85.73 kg) IBW/kg (Calculated) : 61.6   Vital Signs: Temp: 98.1 F (36.7 C) (07/31 1900) Temp src: Oral (07/31 1900) BP: 126/67 mmHg (07/31 2215) Pulse Rate: 73 (07/31 2215)  Labs:  Recent Labs  01/22/14 1104 01/22/14 1507 01/22/14 2030  HGB 12.7  --   --   HCT 39.7  --   --   PLT 290  --   --   APTT  --   --  59*  LABPROT  --   --  16.1*  INR  --   --  1.29  CREATININE 0.77  --   --   TROPONINI <0.30 0.78*  --     Estimated Creatinine Clearance: 68.3 ml/min (by C-G formula based on Cr of 0.77).   Medical History: Past Medical History  Diagnosis Date  . Hypertension   . Diabetes mellitus without complication   . Asthma   . Hyperthyroidism   . Hyperthyroidism   . Hyperlipidemia   . Gout, unspecified   . Hx of knee surgery   . Chest pain     angina  . Angina at rest 01/22/2014  . CAD in native artery 01/22/2014  . S/P PTCA (percutaneous transluminal coronary angioplasty)01/22/14 01/22/2014      Assessment: 77yof admitted with chest pain.  She underwent cardiac cath and had PTCA to OM.  Pt received ASA, bivailrudin and ticagrelor in cath lab.  Tirofiban to start after cath.  Heparin drip to start 8hr after TR band off.  TR band off about 2300 begin heparin drip about 0700 8/1 - no bolus  Goal of Therapy:  Heparin level 0.3-0.7 units/ml Monitor platelets by anticoagulation protocol: Yes   Plan:  Tirofiban bolus 18mcg/kg x1 IV Then drip 0.80mcg/kg/min x18hr Heparin drip 900 uts/hr  HL 6hr after start Daily HL, CBC  Bonnita Nasuti Pharm.D. CPP, BCPS Clinical Pharmacist [phone] 01/22/2014 10:30 PM

## 2014-01-22 NOTE — ED Notes (Signed)
Pt given water to drink. 

## 2014-01-22 NOTE — ED Notes (Signed)
Pt reports increased pressure in chest after ambulating to restroom

## 2014-01-22 NOTE — CV Procedure (Addendum)
Left Heart Catheterization with Coronary Angiography and PTCA Report  Carol [name]  78 y.o.  female May 05, 1936  Procedure Date: 01/22/2014 Referring Physician: Lilian Kapur, M.D. Primary Cardiologist: Lilian Kapur, M.D.  INDICATIONS: ACS  PROCEDURE: 1. Left heart catheterization; 2. Left ventricle hemodynamic recordings; 3. Coronary angiography; 4. PTCA mid to distal obtuse marginal #1  CONSENT:  The risks, benefits, and details of the procedure were explained in detail to the patient. Risks including death, stroke, heart attack, kidney injury, allergy, limb ischemia, bleeding and radiation injury were discussed.  The patient verbalized understanding and wanted to proceed.  Informed written consent was obtained.  PROCEDURE TECHNIQUE:  After Xylocaine anesthesia a 5 French Slender sheath was placed in the right radial artery with an angiocath and the modified Seldinger technique.  Coronary angiography was done using a 5 F JR 4, JL 3.5, EBU 3.0, and 3.5 cm XB catheter.  Left ventriculography was not performed with hemodynamic recordings were made with the 3.0 EBU catheter.   After reviewing the angiographic data, we identified an 80% mid PDA stenosis and a 95% stenosis in the midportion of the second obtuse marginal. This obstruction is in a very tortuous part of this vessel. The distal distribution is relatively large. The vessel caliber is small. After some contemplation, and because the patient was complaining of ongoing chest pain, we decided to attempt PCI on the second obtuse marginal.  Bivalirudin bolus and infusion was started. An ACT was documented to be greater than 300 seconds. 180 mg of Brilinta was administered. A 6 French 3.5 cm XB guide catheter was used and with difficulty we will able to cannulate the ostium of the left main. The procedure was quite difficult because of severe subclavian/innominate artery tortuosity entry of the innominate to the left of the  midline. Poor ability was markedly diminished.  A pro-water wire was advanced into the second obtuse marginal and then with difficulty we were able to cross the stenosis but can never get our guidewire very distal to the lesion because of tortuosity. Angioplasty was performed using a 2.0 x 12 mm Euphora. 2 balloon inflations were to 5 atmospheres. The balloon appeared to be oversized for the vessel. Postdilatation, no dissection was noted. The 99% stenosis was reduced to less than 50%. TIMI grade 3 flow was noted. I chose against attempted stenting because of tortuosity and the small size of the vessel.  Post angioplasty the patient's discomfort gradually decreased.   CONTRAST:  Total of 180 cc.  COMPLICATIONS:  None   HEMODYNAMICS:  Aortic pressure 131/80 mmHg; LV pressure 136/22 mmHg; LVEDP 36 mm mercury  ANGIOGRAPHIC DATA:   The left main coronary artery is widely patent with mid body 30% narrowing.  The left anterior descending artery is widely patent. It wraps around the left ventricular apex. The proximal vessel contains eccentric 50% narrowing..  The left circumflex artery is large. It gives origin to 3 obtuse marginal branches. The first obtuse marginal and small and free of obstruction. The proximal circumflex had a large of the first marginal contains eccentric 40-50% narrowing. The second obtuse marginal is a relatively large vessel in distribution. The vessel caliber is in the 2.0 mm range. The second marginals very tortuous in its midsegment. In the first portion of the tortuous segment there is a 99% stenosis. The third obtuse marginal branch is widely patent and tortuous. It is smaller than the second obtuse marginal..  The right coronary artery is dominant. The proximal  to mid PDA contains 90% stenosis. Again this is a small caliber vessel. The mid RCA contains eccentric 50% narrowing.Marland Kitchen  PCI RESULTS: Successful PTCA of the second obtuse marginal branch. This vessel is  approximately a 2.0 mm diameter vessel. It contained a 99% stenosis within a very tortuous segment. We used a 2.0 mm balloon inflated to 5 atmospheres x2 resulting in reduction of the stenosis from 99% to less than or equal to 50% with TIMI grade 3 flow. High pressure inflation was not performed within the region of tortuosity to avoid dissection.  LEFT VENTRICULOGRAM:  Left ventricular angiogram was was not performed hemodynamics were recorded. There is evidence of significant left ventricular diastolic dysfunction.   IMPRESSIONS:  1. Non-ST elevation myocardial infarction presumably related to high-grade obstruction in a relatively small second obtuse marginal within a tortuous segment. Is 99% stenosis was reduced to 50% with angioplasty. Stenting was not attempted. 2. 80-90% stenosis in the mid PDA 3. Irregularities less than 50% are noted in the mid RCA, proximal LAD, and proximal circumflex. 4. Left ventricular function was not assessed as EDP was significantly elevated. Prior LV function was normal as of 2009. This would suggest probable acute diastolic heart failure.   RECOMMENDATION:  2-D echocardiogram to assess LV systolic function Aspirin and Brilinta for at least a month Bivalirudin has been discontinued Will start Aggrastat for 18 hours one hour after bivalirudin has been discontinued.

## 2014-01-22 NOTE — ED Notes (Addendum)
Pt reports intermittent chest pain x 1 week. Reports worsening pain last night (about 1230am), described as squeezing sharp pains, associated with shortness of breath, lightheadedness, and nausea.  Prior to calling EMS, pt reports worse pain in chest associated with diaphoresis, sts "this was the worse pain I have had." Pt has been taking nitro since 2am (x6) and daughter gave pt ASA at home. Pt reports having shortness of breath, has a pulmonary function test schedule in a couple of weeks.

## 2014-01-22 NOTE — Progress Notes (Signed)
ANTICOAGULATION CONSULT NOTE - Initial Consult  Pharmacy Consult:  Heparin Indication: chest pain/ACS  Allergies  Allergen Reactions  . Contrast Media [Iodinated Diagnostic Agents]   . Cortizone-10 [Hydrocortisone]   . Zocor [Simvastatin]     Muscle aches- severe    Patient Measurements: Height: 5\' 7"  (170.2 cm) Weight: 189 lb (85.73 kg) IBW/kg (Calculated) : 61.6 Heparin Dosing Weight: 80 kg  Vital Signs: Temp: 97.9 F (36.6 C) (07/31 1052) Temp src: Oral (07/31 1052) BP: 114/63 mmHg (07/31 1530) Pulse Rate: 71 (07/31 1530)  Labs:  Recent Labs  01/22/14 1104  HGB 12.7  HCT 39.7  PLT 290  CREATININE 0.77  TROPONINI <0.30    Estimated Creatinine Clearance: 68.3 ml/min (by C-G formula based on Cr of 0.77).   Medical History: Past Medical History  Diagnosis Date  . Hypertension   . Diabetes mellitus without complication   . Asthma   . Hyperthyroidism   . Hyperthyroidism   . Hyperlipidemia   . Gout, unspecified   . Hx of knee surgery   . Chest pain     angina  . Angina at rest 01/22/2014      Assessment: 6 YOF admitted with chest pain to start IV heparin for rule out ACS.  Baseline labs and home meds reviewed.   Goal of Therapy:  Heparin level 0.3-0.7 units/ml Monitor platelets by anticoagulation protocol: Yes    Plan:  - Heparin 3500 units IV bolus x 1, then - Heparin gtt at 1000 units/hr - Check 8 hr HL - Daily HL / CBC    Juli Odom D. Mina Marble, PharmD, BCPS Pager:  [phone] 01/22/2014, 3:53 PM

## 2014-01-22 NOTE — ED Provider Notes (Signed)
CSN: [id number]     Arrival date & time 01/22/14  1044 History   First MD Initiated Contact with Patient 01/22/14 1052     Chief Complaint  Patient presents with  . Chest Pain  . Shortness of Breath     (Consider location/radiation/quality/duration/timing/severity/associated sxs/prior Treatment) Patient is a 78 y.o. female presenting with chest pain and shortness of breath. The history is provided by the patient and a relative. No language interpreter was used.  Chest Pain Pain location:  Substernal area and L chest Pain quality: aching and pressure   Pain radiates to:  Does not radiate Pain radiates to the back: no   Pain severity:  Severe Onset quality:  Unable to specify Duration:  1 month Timing:  Constant Progression:  Waxing and waning Chronicity:  Recurrent Context: at rest   Relieved by:  Nothing Worsened by:  Nothing tried Ineffective treatments:  Nitroglycerin Associated symptoms: diaphoresis and shortness of breath   Associated symptoms: no abdominal pain, no back pain, no cough, no dysphagia, no fatigue, no fever, no headache, no nausea, no near-syncope, no numbness, no palpitations, not vomiting and no weakness   Shortness of breath:    Severity:  Moderate   Duration:  1 month   Timing:  Intermittent   Progression:  Waxing and waning Risk factors: diabetes mellitus, high cholesterol, hypertension and obesity   Risk factors: no prior DVT/PE   Shortness of Breath Associated symptoms: chest pain and diaphoresis   Associated symptoms: no abdominal pain, no cough, no fever, no headaches, no neck pain, no sore throat and no vomiting     Past Medical History  Diagnosis Date  . Hypertension   . Diabetes mellitus without complication   . Asthma   . Hyperthyroidism   . Hyperthyroidism   . Hyperlipidemia   . Gout, unspecified   . Hx of knee surgery   . Chest pain     angina  . Angina at rest 01/22/2014   Past Surgical History  Procedure Laterality Date  .  Cholecystectomy    . Rotator cuff repair Bilateral   . Total knee arthroplasty Right   . Cesarean section    . Abdominal hysterectomy     Family History  Problem Relation Age of Onset  . Hypertension Mother   . Diabetes Mother   . Heart attack Mother 12  . Heart disease Father   . Diabetes Brother   . Heart disease Brother    History  Substance Use Topics  . Smoking status: Never Smoker   . Smokeless tobacco: Not on file  . Alcohol Use: No   OB History   Grav Para Term Preterm Abortions TAB SAB Ect Mult Living                 Review of Systems  Constitutional: Positive for diaphoresis. Negative for fever, chills, activity change, appetite change and fatigue.  HENT: Negative for congestion, facial swelling, rhinorrhea, sore throat and trouble swallowing.   Eyes: Negative for photophobia and discharge.  Respiratory: Positive for shortness of breath. Negative for cough and chest tightness.   Cardiovascular: Positive for chest pain. Negative for palpitations, leg swelling and near-syncope.  Gastrointestinal: Negative for nausea, vomiting, abdominal pain and diarrhea.  Endocrine: Negative for polydipsia and polyuria.  Genitourinary: Negative for dysuria, frequency, difficulty urinating and pelvic pain.  Musculoskeletal: Negative for arthralgias, back pain, neck pain and neck stiffness.  Skin: Negative for color change and wound.  Allergic/Immunologic: Negative for immunocompromised state.  Neurological: Negative for facial asymmetry, weakness, numbness and headaches.  Hematological: Does not bruise/bleed easily.  Psychiatric/Behavioral: Negative for confusion and agitation.      Allergies  Contrast media; Cortizone-10; and Zocor  Home Medications   Prior to Admission medications   Medication Sig Start Date End Date Taking? Authorizing Provider  albuterol (PROVENTIL HFA;VENTOLIN HFA) 108 (90 BASE) MCG/ACT inhaler Inhale 1 puff into the lungs every 6 (six) hours as needed  for wheezing or shortness of breath.   Yes Historical Provider, MD  allopurinol (ZYLOPRIM) 300 MG tablet Take 300 mg by mouth daily.   Yes Historical Provider, MD  amLODipine (NORVASC) 5 MG tablet Take 5 mg by mouth daily.   Yes Historical Provider, MD  Canagliflozin (INVOKANA) 300 MG TABS Take 300 mg by mouth daily.   Yes Historical Provider, MD  colchicine 0.6 MG tablet Take 0.6 mg by mouth daily.    Yes Historical Provider, MD  glipiZIDE (GLUCOTROL) 5 MG tablet Take 5 mg by mouth daily. Take one 5mg  tab once a day for 90 days (10/14/2012)   Yes Historical Provider, MD  glucose blood test strip 1 each by Other route daily. Use as instructed   Yes Historical Provider, MD  lisinopril (PRINIVIL,ZESTRIL) 20 MG tablet Take 20 mg by mouth daily.   Yes Historical Provider, MD  metFORMIN (GLUCOPHAGE) 1000 MG tablet Take 1,000 mg by mouth 2 (two) times daily.    Yes Historical Provider, MD  nitroGLYCERIN (NITROSTAT) 0.4 MG SL tablet Place 0.4 mg under the tongue every 5 (five) minutes as needed for chest pain.   Yes Historical Provider, MD  omeprazole (PRILOSEC) 20 MG capsule Take 20 mg by mouth daily.   Yes Historical Provider, MD  simvastatin (ZOCOR) 40 MG tablet Take 40 mg by mouth daily.   Yes Historical Provider, MD  thyroid (ARMOUR) 60 MG tablet Take 120 mg by mouth daily. Take two tabs once a day for 90 days (09/04/2012)   Yes Historical Provider, MD   BP 102/59  Pulse 78  Temp(Src) 97.9 F (36.6 C) (Oral)  Resp 18  Ht 5\' 7"  (1.702 m)  Wt 189 lb (85.73 kg)  BMI 29.59 kg/m2  SpO2 96% Physical Exam  Constitutional: She is oriented to person, place, and time. She appears well-developed and well-nourished. No distress.  HENT:  Head: Normocephalic and atraumatic.  Mouth/Throat: No oropharyngeal exudate.  Eyes: Pupils are equal, round, and reactive to light.  Neck: Normal range of motion. Neck supple.  Cardiovascular: Normal rate, regular rhythm and normal heart sounds.  Exam reveals no gallop  and no friction rub.   No murmur heard. Pulmonary/Chest: Effort normal and breath sounds normal. No respiratory distress. She has no wheezes. She has no rales.    Abdominal: Soft. Bowel sounds are normal. She exhibits no distension and no mass. There is no tenderness. There is no rebound and no guarding.  Musculoskeletal: Normal range of motion. She exhibits edema (1+ blle edema). She exhibits no tenderness.  Neurological: She is alert and oriented to person, place, and time.  Skin: Skin is warm and dry.  Psychiatric: She has a normal mood and affect.    ED Course  Procedures (including critical care time) Labs Review Labs Reviewed  BASIC METABOLIC PANEL - Abnormal; Notable for the following:    Glucose, Bld 237 (*)    GFR calc non Af Amer 80 (*)    Anion gap 18 (*)    All other components within normal limits  TROPONIN I - Abnormal; Notable for the following:    Troponin I 0.78 (*)    All other components within normal limits  I-STAT TROPOININ, ED - Abnormal; Notable for the following:    Troponin i, poc 0.28 (*)    All other components within normal limits  TROPONIN I  CBC WITH DIFFERENTIAL  PRO B NATRIURETIC PEPTIDE  HEPARIN LEVEL (UNFRACTIONATED)  CBC  I-STAT TROPOININ, ED  POCT ACTIVATED CLOTTING TIME    Imaging Review Ct Chest Wo Contrast  01/22/2014   CLINICAL DATA:  Intermittent chest pain for 1 month worse last night described is squeezing sharp pain associated was shortness of breath, lightheadedness and nausea, diaphoresis, history hypertension, diabetes, gout, asthma  EXAM: CT CHEST WITHOUT CONTRAST  TECHNIQUE: Multidetector CT imaging of the chest was performed following the standard protocol without IV contrast. Sagittal and coronal MPR images reconstructed from axial data set. IV contrast not administered due to history of contrast allergy.  COMPARISON:  05/09/2009 CT chest, chest radiograph 01/22/2014  FINDINGS: Scattered atherosclerotic calcifications aorta and  coronary arteries.  No thoracic adenopathy.  Moderate-sized hiatal hernia.  Calcified granulomata within spleen.  Remaining visualized portions of upper abdomen unremarkable.  Suspect tiny LEFT lower lobe tiny calcified granuloma image 23 present on previous exam as well.  Lungs clear.  No infiltrate, pleural effusion or pneumothorax.  Bones unremarkable.  IMPRESSION: Old granulomatous disease.  No acute intra thoracic abnormalities.   Electronically Signed   By: Lavonia Dana M.D.   On: 01/22/2014 13:12   Dg Chest Port 1 View  01/22/2014   CLINICAL DATA:  Chest pain and shortness of breath.  EXAM: PORTABLE CHEST - 1 VIEW  COMPARISON:  12/18/2010  FINDINGS: Lungs are adequately inflated without consolidation or effusion. Cardiomediastinal silhouette and remainder of the exam is unchanged.  IMPRESSION: No active disease.   Electronically Signed   By: Marin Olp M.D.   On: 01/22/2014 11:16     EKG Interpretation   Date/Time:  Friday January 22 2014 10:48:27 EDT Ventricular Rate:  72 PR Interval:  167 QRS Duration: 91 QT Interval:  397 QTC Calculation: 434 R Axis:   33 Text Interpretation:  Sinus rhythm Consider left atrial enlargement Low  voltage, precordial leads Anteroseptal infarct, old Baseline wander in  lead(s) V6 No significant change since last tracing Confirmed by DOCHERTY   MD, MEGAN [phone]) on 01/22/2014 10:54:06 AM      MDM   Final diagnoses:  Angina at rest  NSTEMI (non-ST elevated myocardial infarction)  Type II or unspecified type diabetes mellitus without mention of complication, not stated as uncontrolled    Pt is a 78 y.o. female with Pmhx as above who presents with CP, SOB. Pt preports 24/7 central CP for 1 month, with SOB on exertion, intermittent diaphoresis. CP worse since 12:30 am (11.5 hrs ago), was improved, but not resolved after taking total of 6 SL NTG. She also took ASA at home. On PE, VSS, pt in NAD. HR nml. Lungs clear, minimal BLLE edema. + L sided chest wall  ttp.  istat trop nml, which is reassuring given the timing of th CP.  CXR clear, BNP 202.7.  Added a non-con CT chest given concern for pulm pathology which showed old granulomatous disease.   Pt feeling somewhat better after 4mg  IV morphine, though still have some underling CP.  Although ACS unlikely w/ timing of pain and negative trop, she does have multiple risk factors for ACS.  Have updated pt &  family. Will consult cardiology and will get delta trop in the mean time.   Spoke w/ cardiology who will send someone to see pt in the ED.   Delta trop positive. Will sent lab trop as there may represent false positive. Cardiology interviewing pt and is made aware.  Lab trop also elevated. Although HPI atypical, Pt will be admitted to cardiology for CP with positive trop likely representing NSTEMI.         Neta Ehlers, MD 01/22/14 [phone]

## 2014-01-22 NOTE — Interval H&P Note (Signed)
pciCath Lab Visit (complete for each Cath Lab visit)  Clinical Evaluation Leading to the Procedure:   ACS: Yes.    Non-ACS:    Anginal Classification: CCS IV  Anti-ischemic medical therapy: Minimal Therapy (1 class of medications)  Non-Invasive Test Results: No non-invasive testing performed  Prior CABG: No previous CABG      History and Physical Interval Note:  01/22/2014 4:28 PM  Carol [name]  has presented today for surgery, with the diagnosis of cp  The various methods of treatment have been discussed with the patient and family. After consideration of risks, benefits and other options for treatment, the patient has consented to  Procedure(s): LEFT HEART CATHETERIZATION WITH CORONARY ANGIOGRAM (N/A) as a surgical intervention .  The patient's history has been reviewed, patient examined, no change in status, stable for surgery.  I have reviewed the patient's chart and labs.  Questions were answered to the patient's satisfaction.     Sinclair Grooms

## 2014-01-22 NOTE — ED Notes (Signed)
Cardiologist at bedside.  

## 2014-01-22 NOTE — ED Notes (Signed)
Critical troponin reported to Mickel Baas

## 2014-01-23 DIAGNOSIS — I517 Cardiomegaly: Secondary | ICD-10-CM

## 2014-01-23 DIAGNOSIS — I5031 Acute diastolic (congestive) heart failure: Secondary | ICD-10-CM

## 2014-01-23 DIAGNOSIS — I509 Heart failure, unspecified: Secondary | ICD-10-CM

## 2014-01-23 LAB — BASIC METABOLIC PANEL
Anion gap: 13 (ref 5–15)
BUN: 17 mg/dL (ref 6–23)
CO2: 22 mEq/L (ref 19–32)
Calcium: 9.1 mg/dL (ref 8.4–10.5)
Chloride: 103 mEq/L (ref 96–112)
Creatinine, Ser: 0.88 mg/dL (ref 0.50–1.10)
GFR calc Af Amer: 73 mL/min — ABNORMAL LOW (ref >90)
GFR calc non Af Amer: 63 mL/min — ABNORMAL LOW (ref >90)
Glucose, Bld: 176 mg/dL — ABNORMAL HIGH (ref 70–99)
POTASSIUM: 4.1 meq/L (ref 3.7–5.3)
SODIUM: 138 meq/L (ref 137–147)

## 2014-01-23 LAB — T4, FREE: Free T4: 1.03 ng/dL (ref 0.80–1.80)

## 2014-01-23 LAB — GLUCOSE, CAPILLARY
Glucose-Capillary: 172 mg/dL — ABNORMAL HIGH (ref 70–99)
Glucose-Capillary: 165 mg/dL — ABNORMAL HIGH (ref 70–99)
Glucose-Capillary: 155 mg/dL — ABNORMAL HIGH (ref 70–99)
Glucose-Capillary: 241 mg/dL — ABNORMAL HIGH (ref 70–99)
Glucose-Capillary: 378 mg/dL — ABNORMAL HIGH (ref 70–99)

## 2014-01-23 LAB — LIPID PANEL
CHOLESTEROL: 151 mg/dL (ref 0–200)
HDL: 35 mg/dL — ABNORMAL LOW (ref >39)
LDL Cholesterol: 88 mg/dL (ref 0–99)
TRIGLYCERIDES: 142 mg/dL (ref <150)
Total CHOL/HDL Ratio: 4.3 RATIO
VLDL: 28 mg/dL (ref 0–40)

## 2014-01-23 LAB — HEMOGLOBIN A1C
Hgb A1c MFr Bld: 9.1 % — ABNORMAL HIGH (ref <5.7)
Mean Plasma Glucose: 214 mg/dL — ABNORMAL HIGH (ref <117)

## 2014-01-23 LAB — CBC
HCT: 34.4 % — ABNORMAL LOW (ref 36.0–46.0)
Hemoglobin: 10.8 g/dL — ABNORMAL LOW (ref 12.0–15.0)
MCH: 27.9 pg (ref 26.0–34.0)
MCHC: 31.4 g/dL (ref 30.0–36.0)
MCV: 88.9 fL (ref 78.0–100.0)
Platelets: 257 10*3/uL (ref 150–400)
RBC: 3.87 MIL/uL (ref 3.87–5.11)
RDW: 15.3 % (ref 11.5–15.5)
WBC: 7.9 10*3/uL (ref 4.0–10.5)

## 2014-01-23 LAB — TROPONIN I
TROPONIN I: 0.83 ng/mL — AB (ref <0.30)
TROPONIN I: 0.86 ng/mL — AB (ref <0.30)
TROPONIN I: 0.78 ng/mL — AB (ref <0.30)

## 2014-01-23 MED ORDER — POTASSIUM CHLORIDE CRYS ER 20 MEQ PO TBCR
40.0000 meq | EXTENDED_RELEASE_TABLET | Freq: Once | ORAL | Status: AC
  Administered 2014-01-23: 40 meq via ORAL
  Filled 2014-01-23: qty 2

## 2014-01-23 MED ORDER — FUROSEMIDE 10 MG/ML IJ SOLN
40.0000 mg | Freq: Two times a day (BID) | INTRAMUSCULAR | Status: DC
  Administered 2014-01-23 – 2014-01-24 (×3): 40 mg via INTRAVENOUS
  Filled 2014-01-23 (×5): qty 4

## 2014-01-23 NOTE — Progress Notes (Signed)
OOB self to [facility]---despite being encouraged to call for assist . Pt denies pain,pressure,tightness,SOB,nausea,etc......Marland Kitchen

## 2014-01-23 NOTE — Progress Notes (Addendum)
Patient ID: Carol [name], female   DOB: [birth year], 78 y.o.   MRN: [id number]    SUBJECTIVE: No further chest pain.  She remains dyspneic with exertion and orthopneic.  She is getting NS at 100 cc/hr.   Scheduled Meds: . allopurinol  300 mg Oral Daily  . aspirin EC  81 mg Oral Daily  . atorvastatin  80 mg Oral q1800  . Canagliflozin  300 mg Oral Daily  . furosemide  40 mg Intravenous BID  . glipiZIDE  5 mg Oral QAC breakfast  . lisinopril  20 mg Oral Daily  . metoprolol tartrate  25 mg Oral BID  . pantoprazole  80 mg Oral Daily  . potassium chloride  40 mEq Oral Once  . thyroid  120 mg Oral QAC breakfast  . ticagrelor  90 mg Oral BID   Continuous Infusions: . heparin 900 Units/hr (01/23/14 0701)  . nitroGLYCERIN 5 mcg/min (01/23/14 0500)  . tirofiban 0.15 mcg/kg/min (01/23/14 0845)   PRN Meds:.acetaminophen, albuterol, ALPRAZolam, nitroGLYCERIN, ondansetron (ZOFRAN) IV, zolpidem    Filed Vitals:   01/23/14 0747 01/23/14 0800 01/23/14 0900 01/23/14 1100  BP: 132/59 119/50 125/60 125/60  Pulse: 72 74 82   Temp: 98.1 F (36.7 C)     TempSrc: Oral     Resp: 23 18 22 20   Height:      Weight:      SpO2: 96% 95% 97%     Intake/Output Summary (Last 24 hours) at 01/23/14 1106 Last data filed at 01/23/14 0600  Gross per 24 hour  Intake 625.44 ml  Output      0 ml  Net 625.44 ml    LABS: Basic Metabolic Panel:  Recent Labs  01/22/14 1104 01/22/14 2030 01/23/14 0225  NA 140  --  138  K 4.4  --  4.1  CL 101  --  103  CO2 21  --  22  GLUCOSE 237*  --  176*  BUN 16  --  17  CREATININE 0.77  --  0.88  CALCIUM 10.1  --  9.1  MG  --  1.8  --    Liver Function Tests: No results found for this basename: AST, ALT, ALKPHOS, BILITOT, PROT, ALBUMIN,  in the last 72 hours No results found for this basename: LIPASE, AMYLASE,  in the last 72 hours CBC:  Recent Labs  01/22/14 1104 01/23/14 0225  WBC 8.8 7.9  NEUTROABS 5.8  --   HGB 12.7 10.8*  HCT 39.7 34.4*    MCV 89.2 88.9  PLT 290 257   Cardiac Enzymes:  Recent Labs  01/22/14 1104 01/22/14 1507  TROPONINI <0.30 0.78*   BNP: No components found with this basename: POCBNP,  D-Dimer: No results found for this basename: DDIMER,  in the last 72 hours Hemoglobin A1C:  Recent Labs  01/22/14 2030  HGBA1C 9.1*   Fasting Lipid Panel:  Recent Labs  01/23/14 0225  CHOL 151  HDL 35*  LDLCALC 88  TRIG 142  CHOLHDL 4.3   Thyroid Function Tests:  Recent Labs  01/22/14 2030  TSH 0.564   Anemia Panel: No results found for this basename: VITAMINB12, FOLATE, FERRITIN, TIBC, IRON, RETICCTPCT,  in the last 72 hours  RADIOLOGY: Ct Chest Wo Contrast  01/22/2014   CLINICAL DATA:  Intermittent chest pain for 1 month worse last night described is squeezing sharp pain associated was shortness of breath, lightheadedness and nausea, diaphoresis, history hypertension, diabetes, gout, asthma  EXAM: CT CHEST WITHOUT  CONTRAST  TECHNIQUE: Multidetector CT imaging of the chest was performed following the standard protocol without IV contrast. Sagittal and coronal MPR images reconstructed from axial data set. IV contrast not administered due to history of contrast allergy.  COMPARISON:  05/09/2009 CT chest, chest radiograph 01/22/2014  FINDINGS: Scattered atherosclerotic calcifications aorta and coronary arteries.  No thoracic adenopathy.  Moderate-sized hiatal hernia.  Calcified granulomata within spleen.  Remaining visualized portions of upper abdomen unremarkable.  Suspect tiny LEFT lower lobe tiny calcified granuloma image 23 present on previous exam as well.  Lungs clear.  No infiltrate, pleural effusion or pneumothorax.  Bones unremarkable.  IMPRESSION: Old granulomatous disease.  No acute intra thoracic abnormalities.   Electronically Signed   By: Lavonia Dana M.D.   On: 01/22/2014 13:12   Dg Chest Port 1 View  01/22/2014   CLINICAL DATA:  Chest pain and shortness of breath.  EXAM: PORTABLE CHEST - 1  VIEW  COMPARISON:  12/18/2010  FINDINGS: Lungs are adequately inflated without consolidation or effusion. Cardiomediastinal silhouette and remainder of the exam is unchanged.  IMPRESSION: No active disease.   Electronically Signed   By: Marin Olp M.D.   On: 01/22/2014 11:16    PHYSICAL EXAM General: NAD Neck: JVP 12 cm, no thyromegaly or thyroid nodule.  Lungs: Decreased breath sounds at bases.  CV: Nondisplaced PMI.  Heart regular S1/S2, no S3/S4, no murmur.  Trace ankle edema.   Abdomen: Soft, nontender, no hepatosplenomegaly, no distention.  Neurologic: Alert and oriented x 3.  Psych: Normal affect. Extremities: No clubbing or cyanosis.   TELEMETRY: Reviewed telemetry pt in NSR  ASSESSMENT AND PLAN: 78 yo with history of DM, HTN, asthma presented with chest pain (acutely) and chronic exertional dyspnea.  She was found to have NSTEMI.  1. CAD: NSTEMI.  S/p PTCA OM2 yesterday.  No further chest pain.  - Continue ASA 81, ticagrelor, atorvastatin.  - Can stop heparin gtt - Will complete Aggrastat this afternoon.  2. Acute presumed diastolic CHF: Patient is volume overloaded with significant exertional symptoms.  - Stop IV fluid - Lasix 40 mg IV bid,1st dose now.   - Follow I/Os, weights.  - Echo  [name] 01/23/2014 11:09 AM

## 2014-01-23 NOTE — Progress Notes (Signed)
CARDIAC REHAB PHASE I   PRE:  Rate/Rhythm: 75 SR  BP:  Sitting: 118/64     SaO2: 92 RA  MODE:  Ambulation: 350 ft   POST:  Rate/Rhythm: 88 SR  BP:  Sitting: 126/58    SaO2: 99 RA  Pt walked 350 ft with RW and assist x1.  Pt had no c/o SOB or CP during ambulation.  Pt had very steady gait and pace during ambulation.  Left MI book and nutrition information for pt to read through.  Cardiac rehab will follow up on Monday to complete education.  Left call bell and phone in reach.  Pt did not have any questions at this time.  Lillia Dallas MS, ACSM RCEP 3:27 PM 01/23/2014

## 2014-01-23 NOTE — Progress Notes (Signed)
  Echocardiogram 2D Echocardiogram has been performed.  Carol [name] 01/23/2014, 3:16 PM

## 2014-01-24 DIAGNOSIS — I5021 Acute systolic (congestive) heart failure: Secondary | ICD-10-CM

## 2014-01-24 LAB — BASIC METABOLIC PANEL
Anion gap: 20 — ABNORMAL HIGH (ref 5–15)
BUN: 24 mg/dL — ABNORMAL HIGH (ref 6–23)
CO2: 22 meq/L (ref 19–32)
Calcium: 9.3 mg/dL (ref 8.4–10.5)
Chloride: 95 mEq/L — ABNORMAL LOW (ref 96–112)
Creatinine, Ser: 1.11 mg/dL — ABNORMAL HIGH (ref 0.50–1.10)
GFR calc Af Amer: 55 mL/min — ABNORMAL LOW (ref >90)
GFR calc non Af Amer: 47 mL/min — ABNORMAL LOW (ref >90)
Glucose, Bld: 272 mg/dL — ABNORMAL HIGH (ref 70–99)
POTASSIUM: 3.7 meq/L (ref 3.7–5.3)
Sodium: 137 mEq/L (ref 137–147)

## 2014-01-24 LAB — CBC
HEMATOCRIT: 35.4 % — AB (ref 36.0–46.0)
Hemoglobin: 11.4 g/dL — ABNORMAL LOW (ref 12.0–15.0)
MCH: 27.9 pg (ref 26.0–34.0)
MCHC: 32.2 g/dL (ref 30.0–36.0)
MCV: 86.6 fL (ref 78.0–100.0)
Platelets: 271 10*3/uL (ref 150–400)
RBC: 4.09 MIL/uL (ref 3.87–5.11)
RDW: 15.5 % (ref 11.5–15.5)
WBC: 10 10*3/uL (ref 4.0–10.5)

## 2014-01-24 LAB — GLUCOSE, CAPILLARY
Glucose-Capillary: 215 mg/dL — ABNORMAL HIGH (ref 70–99)
Glucose-Capillary: 275 mg/dL — ABNORMAL HIGH (ref 70–99)
Glucose-Capillary: 343 mg/dL — ABNORMAL HIGH (ref 70–99)
Glucose-Capillary: 96 mg/dL (ref 70–99)

## 2014-01-24 MED ORDER — POTASSIUM CHLORIDE CRYS ER 20 MEQ PO TBCR
40.0000 meq | EXTENDED_RELEASE_TABLET | Freq: Once | ORAL | Status: AC
  Administered 2014-01-24: 40 meq via ORAL
  Filled 2014-01-24: qty 2

## 2014-01-24 MED ORDER — CARVEDILOL 6.25 MG PO TABS
6.2500 mg | ORAL_TABLET | Freq: Two times a day (BID) | ORAL | Status: DC
  Administered 2014-01-24 – 2014-01-25 (×2): 6.25 mg via ORAL
  Filled 2014-01-24 (×4): qty 1

## 2014-01-24 MED ORDER — INSULIN ASPART 100 UNIT/ML ~~LOC~~ SOLN: 0.0000 [IU] | Freq: Three times a day (TID) | SUBCUTANEOUS | Status: DC

## 2014-01-24 MED ORDER — SPIRONOLACTONE 12.5 MG HALF TABLET
12.5000 mg | ORAL_TABLET | Freq: Every day | ORAL | Status: DC
  Administered 2014-01-24 – 2014-01-25 (×2): 12.5 mg via ORAL
  Filled 2014-01-24 (×2): qty 1

## 2014-01-24 MED ORDER — INSULIN ASPART 100 UNIT/ML ~~LOC~~ SOLN
0.0000 [IU] | Freq: Three times a day (TID) | SUBCUTANEOUS | Status: DC
  Administered 2014-01-24: 11 [IU] via SUBCUTANEOUS
  Administered 2014-01-25: 5 [IU] via SUBCUTANEOUS
  Administered 2014-01-25: 8 [IU] via SUBCUTANEOUS

## 2014-01-24 MED ORDER — FUROSEMIDE 40 MG PO TABS
40.0000 mg | ORAL_TABLET | Freq: Every day | ORAL | Status: DC
  Filled 2014-01-24: qty 1

## 2014-01-24 NOTE — Progress Notes (Signed)
Patient's CBG 343 this afternoon.  PA on call notified, sliding scale insulin ordered (see MAR).  Patient and family updated; all voice understanding.  Will continue to monitor.

## 2014-01-24 NOTE — Progress Notes (Signed)
Patient ID: Carol [name], female   DOB: [birth year], 78 y.o.   MRN: [id number]    SUBJECTIVE: No further chest pain.  Breathing better, still has a cough.  She diuresed very well yesterday and weight is down.   Scheduled Meds: . allopurinol  300 mg Oral Daily  . aspirin EC  81 mg Oral Daily  . atorvastatin  80 mg Oral q1800  . Canagliflozin  300 mg Oral Daily  . carvedilol  6.25 mg Oral BID WC  . furosemide  40 mg Intravenous BID  . glipiZIDE  5 mg Oral QAC breakfast  . lisinopril  20 mg Oral Daily  . pantoprazole  80 mg Oral Daily  . potassium chloride  40 mEq Oral Once  . spironolactone  12.5 mg Oral Daily  . thyroid  120 mg Oral QAC breakfast  . ticagrelor  90 mg Oral BID   Continuous Infusions: . nitroGLYCERIN 5 mcg/min (01/23/14 0500)   PRN Meds:.acetaminophen, albuterol, ALPRAZolam, nitroGLYCERIN, ondansetron (ZOFRAN) IV, zolpidem    Filed Vitals:   01/24/14 0700 01/24/14 0753 01/24/14 0800 01/24/14 1041  BP: 107/53  117/80   Pulse:  72 78   Temp:  98.5 F (36.9 C)    TempSrc:  Oral    Resp:  22 16   Height:      Weight:    184 lb 15.5 oz (83.9 kg)  SpO2:  96% 94%     Intake/Output Summary (Last 24 hours) at 01/24/14 1052 Last data filed at 01/24/14 0830  Gross per 24 hour  Intake  776.8 ml  Output   6600 ml  Net -5823.2 ml    LABS: Basic Metabolic Panel:  Recent Labs  01/22/14 1104 01/22/14 2030 01/23/14 0225 01/24/14 0258  NA 140  --  138 137  K 4.4  --  4.1 3.7  CL 101  --  103 95*  CO2 21  --  22 22  GLUCOSE 237*  --  176* 272*  BUN 16  --  17 24*  CREATININE 0.77  --  0.88 1.11*  CALCIUM 10.1  --  9.1 9.3  MG  --  1.8  --   --    Liver Function Tests: No results found for this basename: AST, ALT, ALKPHOS, BILITOT, PROT, ALBUMIN,  in the last 72 hours No results found for this basename: LIPASE, AMYLASE,  in the last 72 hours CBC:  Recent Labs  01/22/14 1104 01/23/14 0225 01/24/14 0258  WBC 8.8 7.9 10.0  NEUTROABS 5.8  --   --     HGB 12.7 10.8* 11.4*  HCT 39.7 34.4* 35.4*  MCV 89.2 88.9 86.6  PLT 290 257 271   Cardiac Enzymes:  Recent Labs  01/23/14 1110 01/23/14 1551 01/23/14 2205  TROPONINI 0.83* 0.86* 0.78*   BNP: No components found with this basename: POCBNP,  D-Dimer: No results found for this basename: DDIMER,  in the last 72 hours Hemoglobin A1C:  Recent Labs  01/22/14 2030  HGBA1C 9.1*   Fasting Lipid Panel:  Recent Labs  01/23/14 0225  CHOL 151  HDL 35*  LDLCALC 88  TRIG 142  CHOLHDL 4.3   Thyroid Function Tests:  Recent Labs  01/22/14 2030  TSH 0.564   Anemia Panel: No results found for this basename: VITAMINB12, FOLATE, FERRITIN, TIBC, IRON, RETICCTPCT,  in the last 72 hours  RADIOLOGY: Ct Chest Wo Contrast  01/22/2014   CLINICAL DATA:  Intermittent chest pain for 1 month worse last night  described is squeezing sharp pain associated was shortness of breath, lightheadedness and nausea, diaphoresis, history hypertension, diabetes, gout, asthma  EXAM: CT CHEST WITHOUT CONTRAST  TECHNIQUE: Multidetector CT imaging of the chest was performed following the standard protocol without IV contrast. Sagittal and coronal MPR images reconstructed from axial data set. IV contrast not administered due to history of contrast allergy.  COMPARISON:  05/09/2009 CT chest, chest radiograph 01/22/2014  FINDINGS: Scattered atherosclerotic calcifications aorta and coronary arteries.  No thoracic adenopathy.  Moderate-sized hiatal hernia.  Calcified granulomata within spleen.  Remaining visualized portions of upper abdomen unremarkable.  Suspect tiny LEFT lower lobe tiny calcified granuloma image 23 present on previous exam as well.  Lungs clear.  No infiltrate, pleural effusion or pneumothorax.  Bones unremarkable.  IMPRESSION: Old granulomatous disease.  No acute intra thoracic abnormalities.   Electronically Signed   By: Lavonia Dana M.D.   On: 01/22/2014 13:12   Dg Chest Port 1 View  01/22/2014    CLINICAL DATA:  Chest pain and shortness of breath.  EXAM: PORTABLE CHEST - 1 VIEW  COMPARISON:  12/18/2010  FINDINGS: Lungs are adequately inflated without consolidation or effusion. Cardiomediastinal silhouette and remainder of the exam is unchanged.  IMPRESSION: No active disease.   Electronically Signed   By: Marin Olp M.D.   On: 01/22/2014 11:16    PHYSICAL EXAM General: NAD Neck: JVP 7 cm, no thyromegaly or thyroid nodule.  Lungs: Decreased breath sounds at bases.  CV: Nondisplaced PMI.  Heart regular S1/S2, no S3/S4, no murmur.  Trace ankle edema.   Abdomen: Soft, nontender, no hepatosplenomegaly, no distention.  Neurologic: Alert and oriented x 3.  Psych: Normal affect. Extremities: No clubbing or cyanosis.   TELEMETRY: Reviewed telemetry pt in NSR  ASSESSMENT AND PLAN: 78 yo with history of DM, HTN, asthma presented with chest pain (acutely) and chronic exertional dyspnea.  She was found to have NSTEMI.  1. CAD: NSTEMI.  S/p PTCA OM2 yesterday.  No further chest pain.  - Continue ASA 81, ticagrelor, atorvastatin.  2. Acute systolic CHF:  Echo yesterday with EF 25-30% and wall motion abnormalities in LAD territory. However, on cath the LAD had nonobstructive disease and she had PTCA of OM2.  ?Possible LAD-territory MI in the past with spontaneous recanalization.  She has had episodes of CP on and off for the last year.   - Can transition to po Lasix.  - Continue lisinopril. - Stop metoprolol, transition to Coreg. - Add low dose spironolactone.  - Will need echo in 3 months, if EF low will need ICD.   [name] 01/24/2014 10:52 AM

## 2014-01-24 NOTE — Progress Notes (Signed)
Ambulating in room freq w/steady gait. Pt denies pain,pressure,tightness,SOB and nausea .

## 2014-01-24 NOTE — Progress Notes (Signed)
Patient had nitro gtt ordered upon transfer from [place], but no bag hanging.  Per 2H RN, nitro gtt was discontinued on 8/1.  MD on call, Dr. Delane Ginger notified, telephone order given to discontinue nitro gtt order.

## 2014-01-24 NOTE — Plan of Care (Signed)
Problem: Phase II Progression Outcomes Goal: Wean IV nitroglycerin to PO or topical Outcome: Completed/Met Date Met:  01/24/14 IV nitro d/c by Dr. Delane Ginger

## 2014-01-24 NOTE — Progress Notes (Signed)
Patient arrived on unit, alert and oriented x4, vital signs stable.  Patient and family deny any questions or concerns at this time.  Will continue to monitor.

## 2014-01-24 NOTE — Progress Notes (Signed)
Report called to Raquel Sarna , receiving nurse --unit 3E . Pt aware of pending tx to room 3E 28

## 2014-01-25 ENCOUNTER — Encounter (HOSPITAL_COMMUNITY): Payer: Self-pay | Admitting: Anesthesiology

## 2014-01-25 DIAGNOSIS — N179 Acute kidney failure, unspecified: Secondary | ICD-10-CM

## 2014-01-25 LAB — CBC
HCT: 36.9 % (ref 36.0–46.0)
Hemoglobin: 11.8 g/dL — ABNORMAL LOW (ref 12.0–15.0)
MCH: 28.5 pg (ref 26.0–34.0)
MCHC: 32 g/dL (ref 30.0–36.0)
MCV: 89.1 fL (ref 78.0–100.0)
Platelets: 254 10*3/uL (ref 150–400)
RBC: 4.14 MIL/uL (ref 3.87–5.11)
RDW: 15.8 % — AB (ref 11.5–15.5)
WBC: 8.2 10*3/uL (ref 4.0–10.5)

## 2014-01-25 LAB — BASIC METABOLIC PANEL
ANION GAP: 19 — AB (ref 5–15)
BUN: 42 mg/dL — ABNORMAL HIGH (ref 6–23)
CO2: 20 mEq/L (ref 19–32)
Calcium: 9 mg/dL (ref 8.4–10.5)
Chloride: 101 mEq/L (ref 96–112)
Creatinine, Ser: 1.68 mg/dL — ABNORMAL HIGH (ref 0.50–1.10)
GFR calc Af Amer: 33 mL/min — ABNORMAL LOW (ref >90)
GFR, EST NON AFRICAN AMERICAN: 29 mL/min — AB (ref >90)
GLUCOSE: 217 mg/dL — AB (ref 70–99)
Potassium: 3.8 mEq/L (ref 3.7–5.3)
SODIUM: 140 meq/L (ref 137–147)

## 2014-01-25 LAB — GLUCOSE, CAPILLARY
Glucose-Capillary: 251 mg/dL — ABNORMAL HIGH (ref 70–99)
Glucose-Capillary: 240 mg/dL — ABNORMAL HIGH (ref 70–99)

## 2014-01-25 MED ORDER — LISINOPRIL 10 MG PO TABS
10.0000 mg | ORAL_TABLET | Freq: Every day | ORAL | Status: DC
  Administered 2014-01-25: 10 mg via ORAL
  Filled 2014-01-25: qty 1

## 2014-01-25 MED ORDER — TICAGRELOR 90 MG PO TABS: 90.0000 mg | ORAL_TABLET | Freq: Two times a day (BID) | ORAL | Status: DC

## 2014-01-25 MED ORDER — FUROSEMIDE 20 MG PO TABS: 20.0000 mg | ORAL_TABLET | Freq: Every day | ORAL | Status: DC

## 2014-01-25 MED ORDER — CARVEDILOL 6.25 MG PO TABS: 6.2500 mg | ORAL_TABLET | Freq: Two times a day (BID) | ORAL | Status: DC

## 2014-01-25 MED ORDER — LISINOPRIL 20 MG PO TABS: 10.0000 mg | ORAL_TABLET | Freq: Every day | ORAL | Status: DC

## 2014-01-25 MED ORDER — ATORVASTATIN CALCIUM 80 MG PO TABS: 80.0000 mg | ORAL_TABLET | Freq: Every day | ORAL | Status: DC

## 2014-01-25 MED ORDER — SPIRONOLACTONE 25 MG PO TABS
12.5000 mg | ORAL_TABLET | Freq: Every day | ORAL | Status: DC
Start: 2014-01-25 — End: 2014-02-02

## 2014-01-25 MED ORDER — ASPIRIN 81 MG PO TBEC: 81.0000 mg | DELAYED_RELEASE_TABLET | Freq: Every day | ORAL | Status: DC

## 2014-01-25 MED FILL — Sodium Chloride IV Soln 0.9%: INTRAVENOUS | Qty: 50 | Status: AC

## 2014-01-25 NOTE — Discharge Summary (Signed)
Advanced Heart Failure Team  Discharge Summary   Patient ID: Carol [name] MRN: [id number], DOB/AGE: 76/07/1935 76 y.o. Admit date: 01/22/2014 D/C date:     01/25/2014   Primary Discharge Diagnoses:  1) NSTEMI - PTCA of the second obtuse marginal branch 2) A/C systolic HF - EF [id number], grade I DD, trivial MR (01/2014) - Diuresed total net negative of 5.3 liters, discharge weight 185 lbs.  Secondary Discharge Diagnoses:  1) CAD 2) DM2 3) HTN 4) HLD 5) AKI - baseline Creatinine 0.6-0.7  Hospital Course:  Carol [name] is a 78 yo female with a history of DM, HTN, asthma, hyperthyroidism and HLD. She presented to the ED by EMS with CP (01/22/14). She reported that she had been having CP for 2 months that was progressive. She took a total of 6 NTG and pain improved with morphine. Of note her last Nuc study was 12/2012 which was normal with EF 75% and she had an Echo 11/2013 showing an EF 55-60% with no wall motion abnormalities  In the ED there were no acute EKG changes and CT of chest was done which showed scattered atherosclerotic calcifications in the aorta and coronary arteries. Initial troponin POC was negative and peaked at 0.86. She was started on heparin and proceed with LHC. She underwent LHC with PTCA of second obtuse marginal on 01/22/14. She was started on ASA and Brilinata. During her stay she had a repeat ECHO showing depressed EF to 25-30% and she was started on BB and Spiro. She diuresed with lasix and had a net negative of -5.3 liters. She had an acute bump in her creatinine on day of discharge and it was felt to be related to over-diuresis. Her lasix was stopped and ordered to restart on Wednesday. Her ACE-I was also cut back to 10 mg daily.  On day of discharge she had no SOB, orthopnea or CP. Her VSS and was felt safe to be discharged home with close follow-up in HF clinic next week with BMET. Her metformin has been placed on hold d/t AKI and it will need to reassessed at next visit.  If creatinine improves likely can go back to regular home dose. She will need to be on ASA and Brilinata for at least one month.   Discharge Weight Range: 184-187 lbs Discharge Vitals: Blood pressure 107/67, pulse 76, temperature 97.9 F (36.6 C), temperature source Oral, resp. rate 19, height 5' 7.5" (1.715 m), weight 185 lb 4.8 oz (84.052 kg), SpO2 99.00%.  Labs: Lab Results  Component Value Date   WBC 8.2 01/25/2014   HGB 11.8* 01/25/2014   HCT 36.9 01/25/2014   MCV 89.1 01/25/2014   PLT 254 01/25/2014     Recent Labs Lab 01/25/14 0314  NA 140  K 3.8  CL 101  CO2 20  BUN 42*  CREATININE 1.68*  CALCIUM 9.0  GLUCOSE 217*   Lab Results  Component Value Date   CHOL 151 01/23/2014   HDL 35* 01/23/2014   LDLCALC 88 01/23/2014   TRIG 142 01/23/2014   BNP (last 3 results)  Recent Labs  01/22/14 1104  PROBNP 202.7    Diagnostic Studies/Procedures   LHC (01/22/14) The left main coronary artery is widely patent with mid body 30% narrowing.   The left anterior descending artery is widely patent. It wraps around the left ventricular apex. The proximal vessel contains eccentric 50% narrowing..   The left circumflex artery is large. It gives origin to 3 obtuse marginal branches. The  first obtuse marginal and small and free of obstruction. The proximal circumflex had a large of the first marginal contains eccentric 40-50% narrowing. The second obtuse marginal is a relatively large vessel in distribution. The vessel caliber is in the 2.0 mm range. The second marginals very tortuous in its midsegment. In the first portion of the tortuous segment there is a 99% stenosis. The third obtuse marginal branch is widely patent and tortuous. It is smaller than the second obtuse marginal..   The right coronary artery is dominant. The proximal to mid PDA contains 90% stenosis. Again this is a small caliber vessel. The mid RCA contains eccentric 50% narrowing.Marland Kitchen   PCI RESULTS: Successful PTCA of the second  obtuse marginal branch. This vessel is approximately a 2.0 mm diameter vessel. It contained a 99% stenosis within a very tortuous segment. We used a 2.0 mm balloon inflated to 5 atmospheres x2 resulting in reduction of the stenosis from 99% to less than or equal to 50% with TIMI grade 3 flow. High pressure inflation was not performed within the region of tortuosity to avoid dissection.   Discharge Medications     Medication List    STOP taking these medications       amLODipine 5 MG tablet  Commonly known as:  NORVASC     metFORMIN 1000 MG tablet  Commonly known as:  GLUCOPHAGE     simvastatin 40 MG tablet  Commonly known as:  ZOCOR      TAKE these medications       albuterol 108 (90 BASE) MCG/ACT inhaler  Commonly known as:  PROVENTIL HFA;VENTOLIN HFA  Inhale 1 puff into the lungs every 6 (six) hours as needed for wheezing or shortness of breath.     allopurinol 300 MG tablet  Commonly known as:  ZYLOPRIM  Take 300 mg by mouth daily.     aspirin 81 MG EC tablet  Take 1 tablet (81 mg total) by mouth daily.     atorvastatin 80 MG tablet  Commonly known as:  LIPITOR  Take 1 tablet (80 mg total) by mouth daily at 6 PM.     carvedilol 6.25 MG tablet  Commonly known as:  COREG  Take 1 tablet (6.25 mg total) by mouth 2 (two) times daily with a meal.     colchicine 0.6 MG tablet  Take 0.6 mg by mouth daily.     furosemide 20 MG tablet  Commonly known as:  LASIX  Take 1 tablet (20 mg total) by mouth daily.  Start taking on:  01/27/2014     glipiZIDE 5 MG tablet  Commonly known as:  GLUCOTROL  Take 5 mg by mouth daily. Take one 5mg  tab once a day for 90 days (10/14/2012)     glucose blood test strip  1 each by Other route daily. Use as instructed     INVOKANA 300 MG Tabs  Generic drug:  Canagliflozin  Take 300 mg by mouth daily.     lisinopril 20 MG tablet  Commonly known as:  PRINIVIL,ZESTRIL  Take 0.5 tablets (10 mg total) by mouth daily.     nitroGLYCERIN 0.4 MG  SL tablet  Commonly known as:  NITROSTAT  Place 0.4 mg under the tongue every 5 (five) minutes as needed for chest pain.     omeprazole 20 MG capsule  Commonly known as:  PRILOSEC  Take 20 mg by mouth daily.     spironolactone 25 MG tablet  Commonly known as:  ALDACTONE  Take 0.5 tablets (12.5 mg total) by mouth daily.     thyroid 60 MG tablet  Commonly known as:  ARMOUR  Take 120 mg by mouth daily. Take two tabs once a day for 90 days (09/04/2012)     ticagrelor 90 MG Tabs tablet  Commonly known as:  BRILINTA  Take 1 tablet (90 mg total) by mouth 2 (two) times daily.        Disposition   The patient will be discharged in stable condition to home. Discharge Instructions   ACE Inhibitor / ARB already ordered    Complete by:  As directed      Beta Blocker already ordered    Complete by:  As directed      Diet - low sodium heart healthy    Complete by:  As directed      Discharge instructions    Complete by:  As directed   Review medications closely.  1) You will be going home on a decreased dose of lisinopril 2) Changed from Simvastatin to atorvastatin 3) Dont take lasix until Wednesday.  Please bring all of your medications to your clinic appointment next week.     Heart Failure patients record your daily weight using the same scale at the same time of day    Complete by:  As directed      Increase activity slowly    Complete by:  As directed      STOP any activity that causes chest pain, shortness of breath, dizziness, sweating, or exessive weakness    Complete by:  As directed           Follow-up Information   Follow up with [facility] On 02/01/2014. (@ 9:40 am in the [facility]; The clinic is locasted in the [facility] in the [facility]. Gate code [id number] )    Specialty:  Cardiology   Contact information:   [id number] [place] [address] [phone]         Duration of Discharge Encounter: Greater than 35 minutes   Signed, Rande Brunt  01/25/2014, 9:34 AM

## 2014-01-25 NOTE — Care Management Note (Addendum)
    Page 1 of 1   01/25/2014     2:24:37 PM CARE MANAGEMENT NOTE 01/25/2014  Patient:  Carol [name], Carol [name]   Account Number:  [id number]  Date Initiated:  01/25/2014  Documentation initiated by:  [facility]  Subjective/Objective Assessment:   78 yo female with history of DM, HTN, asthma, hyperthyroidism, HLD who presents by EMS to ER with chest pain.//Home with daughter     Action/Plan:   PROCEDURE: 1. Left heart catheterization; 2. Left ventricle hemodynamic recordings; 3. Coronary angiography; 4. PTCA mid to distal obtuse marginal #1.//Access for [facility] services; benefits check for Brilinta.   Anticipated DC Date:  01/25/2014   Anticipated DC Plan:  [place]  CM consult  Medication Assistance      [facility] Choice  NA   Choice offered to / List presented to:             Status of service:  Completed, signed off Medicare Important Message given?  YES (If response is "NO", the following Medicare IM given date fields will be blank) Date Medicare IM given:  01/25/2014 Medicare IM given by:  [facility] Date Additional Medicare IM given:   Additional Medicare IM given by:    Discharge Disposition:  HOME/SELF CARE  Per UR Regulation:  Reviewed for med. necessity/level of care/duration of stay  If discussed at [place] of Stay Meetings, dates discussed:    Comments:  01/25/14 [place], RN, BSN, [place] [phone] PT COPAY WILL BE $45- [place]  01/25/14 [place], RN, BSN, [place] [phone] Spoke with pt at bedside regarding benefits check for Brilinta.  Pt has brochure with 30 day free card and refill assistance card intact.  Pt utilizes [facility] in [place] for prescription needs.  NCM called pharmacy to confirm availability of medication.  Information relayed to pt.  Pt verbalizes importance of filling medication upon discharge.

## 2014-01-25 NOTE — Progress Notes (Addendum)
Patient ID: Carol [name], female   DOB: November 25, 1935, 78 y.o.   MRN: [id number]    SUBJECTIVE: No further chest pain.  Breathing better.  Creatinine is elevated today.   Scheduled Meds: . allopurinol  300 mg Oral Daily  . aspirin EC  81 mg Oral Daily  . atorvastatin  80 mg Oral q1800  . Canagliflozin  300 mg Oral Daily  . carvedilol  6.25 mg Oral BID WC  . glipiZIDE  5 mg Oral QAC breakfast  . insulin aspart  0-15 Units Subcutaneous TID WC  . lisinopril  10 mg Oral Daily  . pantoprazole  80 mg Oral Daily  . spironolactone  12.5 mg Oral Daily  . thyroid  120 mg Oral QAC breakfast  . ticagrelor  90 mg Oral BID   Continuous Infusions:   PRN Meds:.acetaminophen, albuterol, ALPRAZolam, nitroGLYCERIN, ondansetron (ZOFRAN) IV, zolpidem    Filed Vitals:   01/24/14 1754 01/24/14 2122 01/25/14 0052 01/25/14 0500  BP: 128/79 105/68 98/51 107/67  Pulse: 73 73 75 76  Temp:  98.4 F (36.9 C) 97.3 F (36.3 C) 97.9 F (36.6 C)  TempSrc:  Oral Oral Oral  Resp: 18 18 18 19   Height:      Weight:    185 lb 4.8 oz (84.052 kg)  SpO2: 100% 96% 94% 99%    Intake/Output Summary (Last 24 hours) at 01/25/14 0744 Last data filed at 01/25/14 0600  Gross per 24 hour  Intake   1250 ml  Output   2351 ml  Net  -1101 ml    LABS: Basic Metabolic Panel:  Recent Labs  01/22/14 1104 01/22/14 2030  01/24/14 0258 01/25/14 0314  NA 140  --   < > 137 140  K 4.4  --   < > 3.7 3.8  CL 101  --   < > 95* 101  CO2 21  --   < > 22 20  GLUCOSE 237*  --   < > 272* 217*  BUN 16  --   < > 24* 42*  CREATININE 0.77  --   < > 1.11* 1.68*  CALCIUM 10.1  --   < > 9.3 9.0  MG  --  1.8  --   --   --   < > = values in this interval not displayed. Liver Function Tests: No results found for this basename: AST, ALT, ALKPHOS, BILITOT, PROT, ALBUMIN,  in the last 72 hours No results found for this basename: LIPASE, AMYLASE,  in the last 72 hours CBC:  Recent Labs  01/22/14 1104  01/24/14 0258  01/25/14 0314  WBC 8.8  < > 10.0 8.2  NEUTROABS 5.8  --   --   --   HGB 12.7  < > 11.4* 11.8*  HCT 39.7  < > 35.4* 36.9  MCV 89.2  < > 86.6 89.1  PLT 290  < > 271 254  < > = values in this interval not displayed. Cardiac Enzymes:  Recent Labs  01/23/14 1110 01/23/14 1551 01/23/14 2205  TROPONINI 0.83* 0.86* 0.78*   BNP: No components found with this basename: POCBNP,  D-Dimer: No results found for this basename: DDIMER,  in the last 72 hours Hemoglobin A1C:  Recent Labs  01/22/14 2030  HGBA1C 9.1*   Fasting Lipid Panel:  Recent Labs  01/23/14 0225  CHOL 151  HDL 35*  LDLCALC 88  TRIG 142  CHOLHDL 4.3   Thyroid Function Tests:  Recent Labs  01/22/14  2030  TSH 0.564   Anemia Panel: No results found for this basename: VITAMINB12, FOLATE, FERRITIN, TIBC, IRON, RETICCTPCT,  in the last 72 hours  RADIOLOGY: Ct Chest Wo Contrast  01/22/2014   CLINICAL DATA:  Intermittent chest pain for 1 month worse last night described is squeezing sharp pain associated was shortness of breath, lightheadedness and nausea, diaphoresis, history hypertension, diabetes, gout, asthma  EXAM: CT CHEST WITHOUT CONTRAST  TECHNIQUE: Multidetector CT imaging of the chest was performed following the standard protocol without IV contrast. Sagittal and coronal MPR images reconstructed from axial data set. IV contrast not administered due to history of contrast allergy.  COMPARISON:  05/09/2009 CT chest, chest radiograph 01/22/2014  FINDINGS: Scattered atherosclerotic calcifications aorta and coronary arteries.  No thoracic adenopathy.  Moderate-sized hiatal hernia.  Calcified granulomata within spleen.  Remaining visualized portions of upper abdomen unremarkable.  Suspect tiny LEFT lower lobe tiny calcified granuloma image 23 present on previous exam as well.  Lungs clear.  No infiltrate, pleural effusion or pneumothorax.  Bones unremarkable.  IMPRESSION: Old granulomatous disease.  No acute intra  thoracic abnormalities.   Electronically Signed   By: Lavonia Dana M.D.   On: 01/22/2014 13:12   Dg Chest Port 1 View  01/22/2014   CLINICAL DATA:  Chest pain and shortness of breath.  EXAM: PORTABLE CHEST - 1 VIEW  COMPARISON:  12/18/2010  FINDINGS: Lungs are adequately inflated without consolidation or effusion. Cardiomediastinal silhouette and remainder of the exam is unchanged.  IMPRESSION: No active disease.   Electronically Signed   By: Marin Olp M.D.   On: 01/22/2014 11:16    PHYSICAL EXAM General: NAD Neck: JVP 7 cm, no thyromegaly or thyroid nodule.  Lungs: Decreased breath sounds at bases.  CV: Nondisplaced PMI.  Heart regular S1/S2, no S3/S4, no murmur.  No edema.   Abdomen: Soft, nontender, no hepatosplenomegaly, no distention.  Neurologic: Alert and oriented x 3.  Psych: Normal affect. Extremities: No clubbing or cyanosis.   TELEMETRY: Reviewed telemetry pt in NS[facility]  ASSESSMENT AND PLAN: 78 yo with history of DM, HTN, asthma presented with chest pain (acutely) and chronic exertional dyspnea.  She was found to have NSTEMI.  1. CAD: NSTEMI.  S/p PTCA OM2 yesterday.  No further chest pain.  - Continue ASA 81, ticagrelor, atorvastatin.  2. Acute systolic CHF:  Echo yesterday with EF 25-30% and wall motion abnormalities in LAD territory. However, on cath the LAD had nonobstructive disease and she had PTCA of OM2.  ?Possible LAD-territory MI in the past with spontaneous recanalization.  She has had episodes of CP on and off for the last year.  By symptoms, she has been volume overloaded at home.  She diuresed well here with improved dyspnea.  - Hold Lasix today and tomorrow with increased creatinine, restart on Wednesday at 20 mg daily.  - Continue lisinopril but with rise in creatinine will decrease to 10 mg daily. - Can continue Coreg and spironolactone.  - Will need echo in 3 months, if EF low will need ICD.  3. AKI: Suspect related to diuresis +/- contrast.  Will need close  followup. 4. Disposition: Think she can go home today.  Will need cardiac rehab at [facility].  Can followup with me in CHF clinic next week with BMET. Cardiac meds: ASA 81, ticagrelor 90 bid, Coreg 6.25 mg bid, spironolactone 12.5 daily, lisinopril 10 mg daily.  She can restart on Lasix 20 mg po daily on Wednesday.  [name] 01/25/2014 7:44 AM

## 2014-01-25 NOTE — Progress Notes (Signed)
All d/c instructions explained and given to pt.  Verbalized understanding.  D/c off floor via w/c. Transported home by daughter.   Radin Raptis, Therapist, sports.

## 2014-01-25 NOTE — Progress Notes (Signed)
[id number] Cardiac Rehab Completed MI and CHF education with pt. She voices understanding. We discussed CHF zones, daily weights, Low sodium diet, carb counting and  fluid restriction, when to call MD and 911. She has MI booklet and CHF packet.Pt agrees to [facility]. CRP in [place], will send referral. Pt seems very motivated to making lifestyle changes. Deon Pilling, RN 01/25/2014 11:38 AM

## 2014-01-25 NOTE — Progress Notes (Signed)
Pt's daughter stated that she heard MD said that she needed home PT.  Informed Kr. Liane Comber and instructed she will call back .  Pt & daughter said not not worry about it, wants to go home, don't think she need it."  Karie Kirks, Therapist, sports.

## 2014-02-01 ENCOUNTER — Inpatient Hospital Stay (HOSPITAL_COMMUNITY)
Admission: EM | Admit: 2014-02-01 | Discharge: 2014-02-02 | DRG: 638 | Disposition: A | Discharge status: Home / Self Care | Payer: Medicare Other | Attending: Internal Medicine | Admitting: Internal Medicine

## 2014-02-01 ENCOUNTER — Encounter: Payer: Self-pay | Admitting: Critical Care Medicine

## 2014-02-01 ENCOUNTER — Encounter (HOSPITAL_COMMUNITY): Payer: Self-pay | Admitting: Emergency Medicine

## 2014-02-01 ENCOUNTER — Ambulatory Visit (HOSPITAL_BASED_OUTPATIENT_CLINIC_OR_DEPARTMENT_OTHER)
Admit: 2014-02-01 | Discharge: 2014-02-01 | Disposition: A | Discharge status: Home / Self Care | Payer: Medicare Other | Source: Ambulatory Visit | Attending: Internal Medicine | Admitting: Internal Medicine

## 2014-02-01 ENCOUNTER — Other Ambulatory Visit: Payer: Self-pay

## 2014-02-01 VITALS — BP 132/62 | HR 84 | Wt 181.8 lb

## 2014-02-01 DIAGNOSIS — K219 Gastro-esophageal reflux disease without esophagitis: Secondary | ICD-10-CM | POA: Diagnosis present

## 2014-02-01 DIAGNOSIS — Z8673 Personal history of transient ischemic attack (TIA), and cerebral infarction without residual deficits: Secondary | ICD-10-CM

## 2014-02-01 DIAGNOSIS — I10 Essential (primary) hypertension: Secondary | ICD-10-CM | POA: Diagnosis present

## 2014-02-01 DIAGNOSIS — I5022 Chronic systolic (congestive) heart failure: Secondary | ICD-10-CM

## 2014-02-01 DIAGNOSIS — Z888 Allergy status to other drugs, medicaments and biological substances status: Secondary | ICD-10-CM | POA: Diagnosis not present

## 2014-02-01 DIAGNOSIS — E785 Hyperlipidemia, unspecified: Secondary | ICD-10-CM

## 2014-02-01 DIAGNOSIS — E119 Type 2 diabetes mellitus without complications: Secondary | ICD-10-CM

## 2014-02-01 DIAGNOSIS — IMO0001 Reserved for inherently not codable concepts without codable children: Secondary | ICD-10-CM | POA: Diagnosis not present

## 2014-02-01 DIAGNOSIS — I252 Old myocardial infarction: Secondary | ICD-10-CM

## 2014-02-01 DIAGNOSIS — Z79899 Other long term (current) drug therapy: Secondary | ICD-10-CM

## 2014-02-01 DIAGNOSIS — I2510 Atherosclerotic heart disease of native coronary artery without angina pectoris: Secondary | ICD-10-CM

## 2014-02-01 DIAGNOSIS — E0590 Thyrotoxicosis, unspecified without thyrotoxic crisis or storm: Secondary | ICD-10-CM | POA: Diagnosis present

## 2014-02-01 DIAGNOSIS — IMO0002 Reserved for concepts with insufficient information to code with codable children: Secondary | ICD-10-CM | POA: Diagnosis present

## 2014-02-01 DIAGNOSIS — E669 Obesity, unspecified: Secondary | ICD-10-CM

## 2014-02-01 DIAGNOSIS — Z7982 Long term (current) use of aspirin: Secondary | ICD-10-CM

## 2014-02-01 DIAGNOSIS — N182 Chronic kidney disease, stage 2 (mild): Secondary | ICD-10-CM | POA: Diagnosis present

## 2014-02-01 DIAGNOSIS — I129 Hypertensive chronic kidney disease with stage 1 through stage 4 chronic kidney disease, or unspecified chronic kidney disease: Secondary | ICD-10-CM | POA: Diagnosis present

## 2014-02-01 DIAGNOSIS — I951 Orthostatic hypotension: Secondary | ICD-10-CM | POA: Diagnosis present

## 2014-02-01 DIAGNOSIS — R7309 Other abnormal glucose: Secondary | ICD-10-CM

## 2014-02-01 DIAGNOSIS — R5381 Other malaise: Secondary | ICD-10-CM

## 2014-02-01 DIAGNOSIS — I509 Heart failure, unspecified: Secondary | ICD-10-CM

## 2014-02-01 DIAGNOSIS — M109 Gout, unspecified: Secondary | ICD-10-CM | POA: Diagnosis present

## 2014-02-01 DIAGNOSIS — N179 Acute kidney failure, unspecified: Secondary | ICD-10-CM | POA: Diagnosis present

## 2014-02-01 DIAGNOSIS — Z96659 Presence of unspecified artificial knee joint: Secondary | ICD-10-CM

## 2014-02-01 DIAGNOSIS — J45909 Unspecified asthma, uncomplicated: Secondary | ICD-10-CM | POA: Diagnosis present

## 2014-02-01 DIAGNOSIS — E860 Dehydration: Secondary | ICD-10-CM

## 2014-02-01 DIAGNOSIS — Z9861 Coronary angioplasty status: Secondary | ICD-10-CM | POA: Diagnosis not present

## 2014-02-01 DIAGNOSIS — Z91041 Radiographic dye allergy status: Secondary | ICD-10-CM

## 2014-02-01 DIAGNOSIS — R3589 Other polyuria: Secondary | ICD-10-CM | POA: Diagnosis present

## 2014-02-01 DIAGNOSIS — E1165 Type 2 diabetes mellitus with hyperglycemia: Principal | ICD-10-CM

## 2014-02-01 DIAGNOSIS — R5383 Other fatigue: Secondary | ICD-10-CM

## 2014-02-01 DIAGNOSIS — R739 Hyperglycemia, unspecified: Secondary | ICD-10-CM

## 2014-02-01 DIAGNOSIS — I214 Non-ST elevation (NSTEMI) myocardial infarction: Secondary | ICD-10-CM

## 2014-02-01 DIAGNOSIS — R358 Other polyuria: Secondary | ICD-10-CM | POA: Diagnosis present

## 2014-02-01 DIAGNOSIS — N3000 Acute cystitis without hematuria: Secondary | ICD-10-CM | POA: Diagnosis present

## 2014-02-01 LAB — CBC WITH DIFFERENTIAL/PLATELET
BASOS PCT: 1 % (ref 0–1)
Basophils Absolute: 0.1 10*3/uL (ref 0.0–0.1)
Basophils Absolute: 0 10*3/uL (ref 0.0–0.1)
Basophils Relative: 0 % (ref 0–1)
EOS ABS: 0.1 10*3/uL (ref 0.0–0.7)
EOS PCT: 1 % (ref 0–5)
Eosinophils Absolute: 0 10*3/uL (ref 0.0–0.7)
Eosinophils Relative: 0 % (ref 0–5)
HCT: 38.4 % (ref 36.0–46.0)
HCT: 36.5 % (ref 36.0–46.0)
Hemoglobin: 12.6 g/dL (ref 12.0–15.0)
Hemoglobin: 11.8 g/dL — ABNORMAL LOW (ref 12.0–15.0)
LYMPHS ABS: 2.3 10*3/uL (ref 0.7–4.0)
LYMPHS PCT: 29 % (ref 12–46)
Lymphocytes Relative: 29 % (ref 12–46)
Lymphs Abs: 2.1 10*3/uL (ref 0.7–4.0)
MCH: 28.3 pg (ref 26.0–34.0)
MCH: 27.8 pg (ref 26.0–34.0)
MCHC: 32.8 g/dL (ref 30.0–36.0)
MCHC: 32.3 g/dL (ref 30.0–36.0)
MCV: 86.3 fL (ref 78.0–100.0)
MCV: 86.1 fL (ref 78.0–100.0)
Monocytes Absolute: 0.7 10*3/uL (ref 0.1–1.0)
Monocytes Absolute: 0.6 10*3/uL (ref 0.1–1.0)
Monocytes Relative: 8 % (ref 3–12)
Monocytes Relative: 9 % (ref 3–12)
NEUTROS PCT: 62 % (ref 43–77)
Neutro Abs: 5.1 10*3/uL (ref 1.7–7.7)
Neutro Abs: 4.3 10*3/uL (ref 1.7–7.7)
Neutrophils Relative %: 61 % (ref 43–77)
PLATELETS: 319 10*3/uL (ref 150–400)
PLATELETS: 299 10*3/uL (ref 150–400)
RBC: 4.45 MIL/uL (ref 3.87–5.11)
RBC: 4.24 MIL/uL (ref 3.87–5.11)
RDW: 15.3 % (ref 11.5–15.5)
RDW: 15.5 % (ref 11.5–15.5)
WBC: 8.2 10*3/uL (ref 4.0–10.5)
WBC: 7.1 10*3/uL (ref 4.0–10.5)

## 2014-02-01 LAB — COMPREHENSIVE METABOLIC PANEL
ALT: 19 U/L (ref 0–35)
ALT: 18 U/L (ref 0–35)
AST: 28 U/L (ref 0–37)
AST: 18 U/L (ref 0–37)
Albumin: 4.2 g/dL (ref 3.5–5.2)
Albumin: 4.1 g/dL (ref 3.5–5.2)
Alkaline Phosphatase: 166 U/L — ABNORMAL HIGH (ref 39–117)
Alkaline Phosphatase: 161 U/L — ABNORMAL HIGH (ref 39–117)
Anion gap: 21 — ABNORMAL HIGH (ref 5–15)
Anion gap: 21 — ABNORMAL HIGH (ref 5–15)
BILIRUBIN TOTAL: 0.5 mg/dL (ref 0.3–1.2)
BUN: 47 mg/dL — ABNORMAL HIGH (ref 6–23)
BUN: 47 mg/dL — ABNORMAL HIGH (ref 6–23)
CHLORIDE: 91 meq/L — AB (ref 96–112)
CO2: 21 meq/L (ref 19–32)
CO2: 20 meq/L (ref 19–32)
Calcium: 10 mg/dL (ref 8.4–10.5)
Calcium: 9.9 mg/dL (ref 8.4–10.5)
Chloride: 92 mEq/L — ABNORMAL LOW (ref 96–112)
Creatinine, Ser: 1.39 mg/dL — ABNORMAL HIGH (ref 0.50–1.10)
Creatinine, Ser: 1.51 mg/dL — ABNORMAL HIGH (ref 0.50–1.10)
GFR calc Af Amer: 38 mL/min — ABNORMAL LOW (ref >90)
GFR, EST AFRICAN AMERICAN: 42 mL/min — AB (ref >90)
GFR, EST NON AFRICAN AMERICAN: 36 mL/min — AB (ref >90)
GFR, EST NON AFRICAN AMERICAN: 33 mL/min — AB (ref >90)
GLUCOSE: 435 mg/dL — AB (ref 70–99)
Glucose, Bld: 398 mg/dL — ABNORMAL HIGH (ref 70–99)
Potassium: 5.4 mEq/L — ABNORMAL HIGH (ref 3.7–5.3)
Potassium: 5 mEq/L (ref 3.7–5.3)
SODIUM: 133 meq/L — AB (ref 137–147)
Sodium: 133 mEq/L — ABNORMAL LOW (ref 137–147)
TOTAL PROTEIN: 7.7 g/dL (ref 6.0–8.3)
Total Bilirubin: 0.4 mg/dL (ref 0.3–1.2)
Total Protein: 7.9 g/dL (ref 6.0–8.3)

## 2014-02-01 LAB — URINALYSIS, ROUTINE W REFLEX MICROSCOPIC
Bilirubin Urine: NEGATIVE
HGB URINE DIPSTICK: NEGATIVE
Ketones, ur: NEGATIVE mg/dL
Nitrite: NEGATIVE
Protein, ur: NEGATIVE mg/dL
SPECIFIC GRAVITY, URINE: 1.02 (ref 1.005–1.030)
Urobilinogen, UA: 0.2 mg/dL (ref 0.0–1.0)
pH: 5 (ref 5.0–8.0)

## 2014-02-01 LAB — URINE MICROSCOPIC-ADD ON

## 2014-02-01 LAB — BASIC METABOLIC PANEL
ANION GAP: 19 — AB (ref 5–15)
BUN: 43 mg/dL — AB (ref 6–23)
CHLORIDE: 98 meq/L (ref 96–112)
CO2: 20 mEq/L (ref 19–32)
Calcium: 9.6 mg/dL (ref 8.4–10.5)
Creatinine, Ser: 1.24 mg/dL — ABNORMAL HIGH (ref 0.50–1.10)
GFR calc non Af Amer: 41 mL/min — ABNORMAL LOW (ref >90)
GFR, EST AFRICAN AMERICAN: 48 mL/min — AB (ref >90)
Glucose, Bld: 176 mg/dL — ABNORMAL HIGH (ref 70–99)
Potassium: 4.2 mEq/L (ref 3.7–5.3)
Sodium: 137 mEq/L (ref 137–147)

## 2014-02-01 LAB — CBG MONITORING, ED
GLUCOSE-CAPILLARY: 331 mg/dL — AB (ref 70–99)
GLUCOSE-CAPILLARY: 279 mg/dL — AB (ref 70–99)

## 2014-02-01 LAB — GLUCOSE, CAPILLARY: Glucose-Capillary: 265 mg/dL — ABNORMAL HIGH (ref 70–99)

## 2014-02-01 MED ORDER — ONDANSETRON HCL 4 MG/2ML IJ SOLN: 4.0000 mg | Freq: Four times a day (QID) | INTRAMUSCULAR | Status: DC | PRN

## 2014-02-01 MED ORDER — SODIUM CHLORIDE 0.9 % IV SOLN: INTRAVENOUS | Status: DC

## 2014-02-01 MED ORDER — GLIPIZIDE 5 MG PO TABS: 5.0000 mg | ORAL_TABLET | Freq: Two times a day (BID) | ORAL | Status: DC

## 2014-02-01 MED ORDER — ASPIRIN EC 81 MG PO TBEC
81.0000 mg | DELAYED_RELEASE_TABLET | Freq: Every day | ORAL | Status: DC
  Administered 2014-02-02: 81 mg via ORAL
  Filled 2014-02-01: qty 1

## 2014-02-01 MED ORDER — PANTOPRAZOLE SODIUM 40 MG PO TBEC
40.0000 mg | DELAYED_RELEASE_TABLET | Freq: Every day | ORAL | Status: DC
  Administered 2014-02-01: 40 mg via ORAL
  Filled 2014-02-01: qty 1

## 2014-02-01 MED ORDER — INSULIN GLARGINE 100 UNIT/ML ~~LOC~~ SOLN
8.0000 [IU] | Freq: Every day | SUBCUTANEOUS | Status: DC
  Administered 2014-02-01: 8 [IU] via SUBCUTANEOUS
  Filled 2014-02-01 (×2): qty 0.08

## 2014-02-01 MED ORDER — INSULIN ASPART 100 UNIT/ML ~~LOC~~ SOLN
0.0000 [IU] | Freq: Three times a day (TID) | SUBCUTANEOUS | Status: DC
  Administered 2014-02-02: 5 [IU] via SUBCUTANEOUS
  Administered 2014-02-02: 8 [IU] via SUBCUTANEOUS

## 2014-02-01 MED ORDER — ONDANSETRON HCL 4 MG PO TABS: 4.0000 mg | ORAL_TABLET | Freq: Four times a day (QID) | ORAL | Status: DC | PRN

## 2014-02-01 MED ORDER — DEXTROSE 5 % IV SOLN
1.0000 g | INTRAVENOUS | Status: DC
  Administered 2014-02-02: 1 g via INTRAVENOUS
  Filled 2014-02-01: qty 10

## 2014-02-01 MED ORDER — DEXTROSE 5 % IV SOLN
1.0000 g | Freq: Once | INTRAVENOUS | Status: AC
  Administered 2014-02-01: 1 g via INTRAVENOUS
  Filled 2014-02-01: qty 10

## 2014-02-01 MED ORDER — INSULIN ASPART 100 UNIT/ML ~~LOC~~ SOLN
10.0000 [IU] | Freq: Once | SUBCUTANEOUS | Status: AC
  Administered 2014-02-01: 10 [IU] via SUBCUTANEOUS
  Filled 2014-02-01: qty 1

## 2014-02-01 MED ORDER — NITROGLYCERIN 0.4 MG SL SUBL: 0.4000 mg | SUBLINGUAL_TABLET | SUBLINGUAL | Status: DC | PRN

## 2014-02-01 MED ORDER — SODIUM CHLORIDE 0.9 % IV SOLN
INTRAVENOUS | Status: AC
  Administered 2014-02-01: 50 mL/h via INTRAVENOUS

## 2014-02-01 MED ORDER — TICAGRELOR 90 MG PO TABS
90.0000 mg | ORAL_TABLET | Freq: Two times a day (BID) | ORAL | Status: DC
  Administered 2014-02-01 – 2014-02-02 (×2): 90 mg via ORAL
  Filled 2014-02-01 (×3): qty 1

## 2014-02-01 MED ORDER — ALBUTEROL SULFATE (2.5 MG/3ML) 0.083% IN NEBU: 3.0000 mL | INHALATION_SOLUTION | Freq: Four times a day (QID) | RESPIRATORY_TRACT | Status: DC | PRN

## 2014-02-01 MED ORDER — ENOXAPARIN SODIUM 30 MG/0.3ML ~~LOC~~ SOLN
30.0000 mg | SUBCUTANEOUS | Status: DC
  Administered 2014-02-01: 30 mg via SUBCUTANEOUS
  Filled 2014-02-01: qty 0.3

## 2014-02-01 MED ORDER — ACETAMINOPHEN 325 MG PO TABS: 650.0000 mg | ORAL_TABLET | Freq: Four times a day (QID) | ORAL | Status: DC | PRN

## 2014-02-01 MED ORDER — THYROID 120 MG PO TABS
120.0000 mg | ORAL_TABLET | Freq: Every day | ORAL | Status: DC
  Administered 2014-02-02: 120 mg via ORAL
  Filled 2014-02-01 (×2): qty 1

## 2014-02-01 MED ORDER — ACETAMINOPHEN 650 MG RE SUPP: 650.0000 mg | Freq: Four times a day (QID) | RECTAL | Status: DC | PRN

## 2014-02-01 MED ORDER — CANAGLIFLOZIN 300 MG PO TABS: 300.0000 mg | ORAL_TABLET | Freq: Every day | ORAL | Status: DC

## 2014-02-01 MED ORDER — CARVEDILOL 6.25 MG PO TABS
6.2500 mg | ORAL_TABLET | Freq: Two times a day (BID) | ORAL | Status: DC
  Administered 2014-02-02: 6.25 mg via ORAL
  Filled 2014-02-01 (×3): qty 1

## 2014-02-01 MED ORDER — ATORVASTATIN CALCIUM 80 MG PO TABS
80.0000 mg | ORAL_TABLET | Freq: Every day | ORAL | Status: DC
  Administered 2014-02-01: 80 mg via ORAL
  Filled 2014-02-01 (×2): qty 1

## 2014-02-01 MED ORDER — GLIPIZIDE 5 MG PO TABS
5.0000 mg | ORAL_TABLET | Freq: Two times a day (BID) | ORAL | Status: DC
  Filled 2014-02-01: qty 1

## 2014-02-01 MED ORDER — INSULIN ASPART 100 UNIT/ML ~~LOC~~ SOLN
0.0000 [IU] | Freq: Every day | SUBCUTANEOUS | Status: DC
  Administered 2014-02-01: 3 [IU] via SUBCUTANEOUS

## 2014-02-01 MED ORDER — SODIUM CHLORIDE 0.9 % IV BOLUS (SEPSIS)
500.0000 mL | Freq: Once | INTRAVENOUS | Status: AC
  Administered 2014-02-01: 500 mL via INTRAVENOUS

## 2014-02-01 MED ORDER — SODIUM CHLORIDE 0.9 % IV BOLUS (SEPSIS)
500.0000 mL | Freq: Once | INTRAVENOUS | Status: AC
Start: 2014-02-01 — End: 2014-02-01
  Administered 2014-02-01: 500 mL via INTRAVENOUS

## 2014-02-01 MED ORDER — ENOXAPARIN SODIUM 40 MG/0.4ML ~~LOC~~ SOLN
40.0000 mg | Freq: Every day | SUBCUTANEOUS | Status: DC
  Administered 2014-02-02: 40 mg via SUBCUTANEOUS
  Filled 2014-02-01: qty 0.4

## 2014-02-01 NOTE — ED Notes (Signed)
Attempted report x 2 

## 2014-02-01 NOTE — H&P (Signed)
[facility] History and Physical  Carol [name] [id number] DOB: 01-Oct-1935 DOA: 02/01/2014  Referring physician: EDP PCP: Lillard Anes, MD   Chief Complaint: FATIGUE, WEAKNESS and high blood sugars since she was discharged from the hospital.   HPI: Carol [name] is a 78 y.o. female with prior h/o CAD, s/p NSTEMI with PCI, DM, comes in for fatigue , generalized weakness and saw her cardiologist in the office today and was sent to ED for for further evaluation . On arrival to ED, she was found to have elevated cbg's in 400's with AG of 21. Patient was given 10 units of novo log by the time i saw her. She also found to be dehydrated and has orthostatic hypotension. She also reports dizziness on walking, but no episodes of syncope.  UA revealed mild UTI, and she reports symptoms of dysuria last few days. She denies any other complaints. She is referred to [facility] for admission for hydration, uncontrolled DM and UTI.   Review of Systems:  Constitutional:  No weight loss, night sweats, Fevers, chills, positive for fatigue and generalized weakness.  HEENT:  No headaches, Difficulty swallowing,Tooth/dental problems,Sore throat,  No sneezing, itching, ear ache, nasal congestion, post nasal drip,  Cardio-vascular:  No chest pain, Orthopnea, PND, swelling in lower extremities, anasarca,  palpitations . She has dizziness.  GI:  No indigestion, abdominal pain, nausea, vomiting, diarrhea, change in bowel habits, loss of appetite  Reports heartburn. Resp:  No shortness of breath with exertion or at rest. No excess mucus, no productive cough, No non-productive cough, No coughing up of blood.No change in color of mucus.No wheezing.No chest wall deformity  Skin:  no rash or lesions.  GU:  no , change in color of urine, no urgency or frequency. No flank pain. Has symptoms of dysuria Musculoskeletal:  No joint pain or swelling. No decreased range of motion. No back pain. Reports muscle  cramps. Psych:  No change in mood or affect. No depression or anxiety. No memory loss.   Past Medical History  Diagnosis Date  . Hypertension   . Asthma   . Hyperlipidemia   . Gout, unspecified   . CAD in native artery 01/22/2014  . S/P PTCA (percutaneous transluminal coronary angioplasty)01/22/14 01/22/2014    a LHC (12/2013): successful PTCA second obtuse marginal, contained 99% stenosis within a very tortuous segment, reduction from 99% to less than or equal to 50% with TIMI grade 3 flow, 80-90% stenosis in mid PDA, Irregularities less than 50% in mid RCA, pox LAD and prox LCx  . Chronic systolic heart failure     a. ICM b. ECHO (01/2014): EF 25-30%, grade I DD, trivial MR  . CHF (congestive heart failure)   . NSTEMI (non-ST elevated myocardial infarction) 2014; 12/2013    "didn't know I'd had it til 12/2013; in the process of having one when I came in"  . Environmental allergies   . Pneumonia     "2-3 times" (01/25/2014)  . Hyperthyroidism   . Type II diabetes mellitus   . GERD (gastroesophageal reflux disease)   . Stroke 1990's    "mild", denies residual   Past Surgical History  Procedure Laterality Date  . Shoulder open rotator cuff repair Bilateral 1980's - 1990's  . Total knee arthroplasty Right 2009  . Cesarean section  [id number]; 1960  . Abdominal hysterectomy    . Appendectomy  1959  . Cholecystectomy  1989  . Joint replacement    . Reduction mammaplasty Bilateral 1990's  .  Cataract extraction w/ intraocular lens  implant, bilateral Bilateral 2013  . Coronary angioplasty  12/2013   Social History:  reports that she has never smoked. She has never used smokeless tobacco. She reports that she does not drink alcohol or use illicit drugs.  Allergies  Allergen Reactions  . Contrast Media [Iodinated Diagnostic Agents] Other (See Comments)    unknown  . Cortizone-10 [Hydrocortisone] Other (See Comments)    unknown  . Zocor [Simvastatin]     Muscle aches- severe    Family  History  Problem Relation Age of Onset  . Hypertension Mother   . Diabetes Mother   . Heart attack Mother 18  . Heart disease Father   . Diabetes Brother   . Heart disease Brother      Prior to Admission medications   Medication Sig Start Date End Date Taking? Authorizing Provider  albuterol (PROVENTIL HFA;VENTOLIN HFA) 108 (90 BASE) MCG/ACT inhaler Inhale 1 puff into the lungs every 6 (six) hours as needed for wheezing or shortness of breath.   Yes Historical Provider, MD  allopurinol (ZYLOPRIM) 300 MG tablet Take 300 mg by mouth daily.   Yes Historical Provider, MD  aspirin EC 81 MG EC tablet Take 1 tablet (81 mg total) by mouth daily. 01/25/14  Yes Rande Brunt, NP  atorvastatin (LIPITOR) 80 MG tablet Take 1 tablet (80 mg total) by mouth daily at 6 PM. 01/25/14  Yes Rande Brunt, NP  Biotin 5000 MCG CAPS Take 1 capsule by mouth daily.   Yes Historical Provider, MD  Canagliflozin (INVOKANA) 300 MG TABS Take 300 mg by mouth daily.   Yes Historical Provider, MD  carvedilol (COREG) 6.25 MG tablet Take 1 tablet (6.25 mg total) by mouth 2 (two) times daily with a meal. 01/25/14  Yes Rande Brunt, NP  colchicine 0.6 MG tablet Take 0.6 mg by mouth daily.    Yes Historical Provider, MD  furosemide (LASIX) 20 MG tablet Take 1 tablet (20 mg total) by mouth daily. 01/27/14  Yes Rande Brunt, NP  Lactobacillus (PROBIOTIC ACIDOPHILUS) TABS Take 1 tablet by mouth daily.    Yes Historical Provider, MD  metFORMIN (GLUMETZA) 1000 MG (MOD) 24 hr tablet Take 1,000 mg by mouth 2 (two) times daily with a meal.   Yes Historical Provider, MD  nitroGLYCERIN (NITROSTAT) 0.4 MG SL tablet Place 0.4 mg under the tongue every 5 (five) minutes as needed for chest pain.   Yes Historical Provider, MD  omeprazole (PRILOSEC) 20 MG capsule Take 20 mg by mouth daily.   Yes Historical Provider, MD  spironolactone (ALDACTONE) 25 MG tablet Take 0.5 tablets (12.5 mg total) by mouth daily. 01/25/14  Yes Rande Brunt, NP  thyroid  (ARMOUR) 60 MG tablet Take 120 mg by mouth daily. Take two tabs once a day for 90 days (09/04/2012)   Yes Historical Provider, MD  ticagrelor (BRILINTA) 90 MG TABS tablet Take 1 tablet (90 mg total) by mouth 2 (two) times daily. 01/25/14  Yes Rande Brunt, NP  glipiZIDE (GLUCOTROL) 5 MG tablet Take 1 tablet (5 mg total) by mouth 2 (two) times daily before a meal. 02/01/14   Jolaine Artist, MD   Physical Exam: Filed Vitals:   02/01/14 1700 02/01/14 1730 02/01/14 1749 02/01/14 1821  BP: 141/59 138/50 138/50 144/79  Pulse: 72 79 78 77  Temp:    97.7 F (36.5 C)  TempSrc:      Resp:    20  Height:  5' 7.5" (1.715 m)  Weight:    82.373 kg (181 lb 9.6 oz)  SpO2: 99% 98% 99% 98%    Wt Readings from Last 3 Encounters:  02/01/14 82.373 kg (181 lb 9.6 oz)  02/01/14 82.441 kg (181 lb 12 oz)  01/25/14 84.052 kg (185 lb 4.8 oz)    General:  Appears calm and comfortable Eyes: PERRL, normal lids, irises & conjunctiva Neck: no LAD, masses or thyromegaly Cardiovascular: RRR, no m/r/g. No LE edema. Respiratory: CTA bilaterally, no w/r/r. Normal respiratory effort. Abdomen: soft, ntnd Skin: no rash or induration seen on limited exam Musculoskeletal: grossly normal tone BUE/BLE Psychiatric: grossly normal mood and affect, speech fluent and appropriate Neurologic: grossly non-focal.          Labs on Admission:  Basic Metabolic Panel:  Recent Labs Lab 02/01/14 1130 02/01/14 1421  NA 133* 133*  K 5.4* 5.0  CL 91* 92*  CO2 21 20  GLUCOSE 435* 398*  BUN 47* 47*  CREATININE 1.39* 1.51*  CALCIUM 10.0 9.9   Liver Function Tests:  Recent Labs Lab 02/01/14 1130 02/01/14 1421  AST 28 18  ALT 19 18  ALKPHOS 166* 161*  BILITOT 0.5 0.4  PROT 7.9 7.7  ALBUMIN 4.2 4.1   No results found for this basename: LIPASE, AMYLASE,  in the last 168 hours No results found for this basename: AMMONIA,  in the last 168 hours CBC:  Recent Labs Lab 02/01/14 1130 02/01/14 1421  WBC 8.2 7.1    NEUTROABS 5.1 4.3  HGB 12.6 11.8*  HCT 38.4 36.5  MCV 86.3 86.1  PLT 319 299   Cardiac Enzymes: No results found for this basename: CKTOTAL, CKMB, CKMBINDEX, TROPONINI,  in the last 168 hours  BNP (last 3 results)  Recent Labs  01/22/14 1104  PROBNP 202.7   CBG:  Recent Labs Lab 02/01/14 1623 02/01/14 1748  GLUCAP 331* 279*    Radiological Exams on Admission: No results found.  EKG: sinus rhythm with t wave abnormalities.   Assessment/Plan Active Problems:   Hyperglycemia   Dehydration   Uncontrolled diabetes Mellitus: - admitted to telemetry. Patient received 10 units of novolog in ED. Her cbg is in 200's. Please obtain a stat BMP and if she had elevated anion gap, will start her on IV insulin.  - stop metformin and resume  SSI.  - her hgba1c is 9.1, will need to be started on low dose lantus tonight if no gap on BMP.   Recent NSTEMI , CAD - resume coreg and brilinta.     UTI: Urine cultures sent and on rocephin.   Dehydration and orthostatic hypotension: - holding lasix and spironolactone, gentle hydration and repeat orthostatic vital signs in am.  - if normal, can resume them on discharge.    DVT prophylaxis.    CKD stage 2 to3: Slight worsening when compared to baseline.  Hydration and monitor in am.     Code Status: full code.  DVT Prophylaxis: lovenox Family Communication: family at bedside Disposition Plan: admit to telemetry  Time spent: 65 minutes.   [facility] Pager [phone]  **Disclaimer: This note may have been dictated with voice recognition software. Similar sounding words can inadvertently be transcribed and this note may contain transcription errors which may not have been corrected upon publication of note.**

## 2014-02-01 NOTE — Addendum Note (Signed)
Encounter addended by: Kerry Dory, CMA on: 02/01/2014 11:08 AM<BR>     Documentation filed: Visit Diagnoses, Flowsheet VN, Orders

## 2014-02-01 NOTE — Progress Notes (Addendum)
Patient ID: Carol [name], female   DOB: 06/06/1936, 78 y.o.   MRN: [id number]   Weight Range   Baseline proBNP    Cardiologist: Liane Comber, MD  HPI:  Carol [name] is a 78 yo with history of obesity, DM, HTN and asthma. Admitted to the hospital last week with NSTEMI and HF.   Cath 8/15 LM ok LAD 50% prox LCX 40-50% prox OM-2 99% -> PCI with DES RCA  PDA 90% (medical mgmt) LVEDP 36  Echo with EF 25-30% with anterior WMA (EF newly down). ECG showed anterior Qs but cath findings didn't match up. ? Previous silent MI with recannulization of LAD. Diuresed and meds adjusted. Weight was 185 on d/c. Metformin stopped due to cr increase 1.1->1.7. Started glipizide but she never filled it.  Returns for f/u: Feels weak and rotten. Not eating much. Sugars running high. Weight down 6 pounds to 178. + dizzy when standing. No chest pain. Taking Brilinta. No edema, orthopnea or PND.   Has not filled glipizide or lisinopril. Taking lasix 20 daily and spiro 12.5 daily. Very thirsty.    ROS: All systems negative except as listed in HPI, PMH and Problem List.  Past Medical History  Diagnosis Date  . Hypertension   . Asthma   . Hyperlipidemia   . Gout, unspecified   . CAD in native artery 01/22/2014  . S/P PTCA (percutaneous transluminal coronary angioplasty)01/22/14 01/22/2014    a LHC (12/2013): successful PTCA second obtuse marginal, contained 99% stenosis within a very tortuous segment, reduction from 99% to less than or equal to 50% with TIMI grade 3 flow, 80-90% stenosis in mid PDA, Irregularities less than 50% in mid RCA, pox LAD and prox LCx  . Chronic systolic heart failure     a. ICM b. ECHO (01/2014): EF 25-30%, grade I DD, trivial MR  . CHF (congestive heart failure)   . NSTEMI (non-ST elevated myocardial infarction) 2014; 12/2013    "didn't know I'd had it til 12/2013; in the process of having one when I came in"  . Environmental allergies   . Pneumonia     "2-3 times" (01/25/2014)  .  Hyperthyroidism   . Type II diabetes mellitus   . GERD (gastroesophageal reflux disease)   . Stroke 1990's    "mild", denies residual    Current Outpatient Prescriptions  Medication Sig Dispense Refill  . albuterol (PROVENTIL HFA;VENTOLIN HFA) 108 (90 BASE) MCG/ACT inhaler Inhale 1 puff into the lungs every 6 (six) hours as needed for wheezing or shortness of breath.      . allopurinol (ZYLOPRIM) 300 MG tablet Take 300 mg by mouth daily.      Marland Kitchen aspirin EC 81 MG EC tablet Take 1 tablet (81 mg total) by mouth daily.  30 tablet  3  . atorvastatin (LIPITOR) 80 MG tablet Take 1 tablet (80 mg total) by mouth daily at 6 PM.  30 tablet  3  . Biotin 5000 MCG CAPS Take 1 capsule by mouth daily.      . Canagliflozin (INVOKANA) 300 MG TABS Take 300 mg by mouth daily.      . carvedilol (COREG) 6.25 MG tablet Take 1 tablet (6.25 mg total) by mouth 2 (two) times daily with a meal.  60 tablet  3  . colchicine 0.6 MG tablet Take 0.6 mg by mouth daily.       . furosemide (LASIX) 20 MG tablet Take 1 tablet (20 mg total) by mouth daily.  30 tablet  3  . glucose blood test strip 1 each by Other route daily. Use as instructed      . Lactobacillus (PROBIOTIC ACIDOPHILUS) TABS Take 1 tablet by mouth.      . metFORMIN (GLUMETZA) 1000 MG (MOD) 24 hr tablet Take 1,000 mg by mouth 2 (two) times daily with a meal.      . nitroGLYCERIN (NITROSTAT) 0.4 MG SL tablet Place 0.4 mg under the tongue every 5 (five) minutes as needed for chest pain.      Marland Kitchen omeprazole (PRILOSEC) 20 MG capsule Take 20 mg by mouth daily.      Marland Kitchen spironolactone (ALDACTONE) 25 MG tablet Take 0.5 tablets (12.5 mg total) by mouth daily.  15 tablet  3  . thyroid (ARMOUR) 60 MG tablet Take 120 mg by mouth daily. Take two tabs once a day for 90 days (09/04/2012)      . ticagrelor (BRILINTA) 90 MG TABS tablet Take 1 tablet (90 mg total) by mouth 2 (two) times daily.  60 tablet  3   No current facility-administered medications for this encounter.      PHYSICAL EXAM: Filed Vitals:   02/01/14 0957  BP: 132/62  Pulse: 76  Weight: 181 lb 12 oz (82.441 kg)  SpO2: 99%    General:  Fatigued appearing. No resp difficulty HEENT: normal Neck: supple. JVP flat. Carotids 2+ bilaterally; no bruits. No lymphadenopathy or thryomegaly appreciated. Cor: PMI normal. Regular rate & rhythm. No rubs, gallops or murmurs. Lungs: clear Abdomen: soft, nontender, nondistended. No hepatosplenomegaly. No bruits or masses. Good bowel sounds. Extremities: no cyanosis, clubbing, rash, edema Neuro: alert & orientedx3, cranial nerves grossly intact. Moves all 4 extremities w/o difficulty. Affect pleasant.   ASSESSMENT & PLAN: 1. Chronic systolic HF EF 0000000 by echo 8/15 2. CAD s/p NSTEMI with DES to OM-2 8/15    --Anterior Q waves on ECG 3. DM2 4. Fatigue  She looks very dry. Suspect she is over diuresed due to lasix and hyperglycemia. Will stop lasix completely. Only take lasix if weight 185 or greater. Will have her take glipizide 5 and f/u with PCP for further adjustment. Check BMET today - I am worried about progressive renal dysfunction and may even have some acidosis. Will not start ACE-I yet until we see what renal function looks like.  Will need cardiac MRI in 1 month (if renal function ok) to assess EF and anterior wall for scar. If scarred will need ICD sooner.   Continue b-blocker, statin, ASA and Brilinta for CAD.  F/u 4 weeks  Benay Spice 10:44 AM

## 2014-02-01 NOTE — Patient Instructions (Signed)
HOLD Lasix if weight is less than 185 lbs.  Labs today.  Your physician recommends that you schedule a follow-up appointment in: 4 weeks  Do the following things EVERYDAY: 1) Weigh yourself in the morning before breakfast. Write it down and keep it in a log. 2) Take your medicines as prescribed 3) Eat low salt foods-Limit salt (sodium) to 2000 mg per day.  4) Stay as active as you can everyday 5) Limit all fluids for the day to less than 2 liters 6)

## 2014-02-01 NOTE — ED Provider Notes (Signed)
CSN: [id number]     Arrival date & time 02/01/14  1351 History   First MD Initiated Contact with Patient 02/01/14 1449     Chief Complaint  Patient presents with  . Weakness  . Hyperglycemia   (Consider location/radiation/quality/duration/timing/severity/associated sxs/prior Treatment) HPI Comments: Patient with history of CAD, recent NSTEMI treated with PCI, diabetes on oral anti-hyperglycemics -- presents with several day history of extreme fatigue, hyperglycemia, polyuria. Patient was discharged from the hospital on [place] and was taken off metformin to be replaced by glipizide, but due to an error she was not prescribed glipizide for her diabetes until today. She has not been taking glipizide for the past week. She had a cardiology followup with Dr. Haroldine Laws today. She was found to be over diuresed and was sent to the emergency department for further evaluation. Patient is taking Lasix once a day as prescribed. She denies any accompanying fever, URI symptoms, chest pain, shortness of breath, abdominal pain. No nausea, vomiting, or diarrhea. No dysuria. Patient states that she does basic chores at home but is otherwise too tired to do anything else. The onset of this condition was acute. The course is constant. Aggravating factors: none. Alleviating factors: none.    Patient is a 78 y.o. female presenting with weakness and hyperglycemia. The history is provided by the patient and medical records.  Weakness Associated symptoms include weakness. Pertinent negatives include no abdominal pain, chest pain, coughing, fever, headaches, myalgias, nausea, rash, sore throat or vomiting.  Hyperglycemia Associated symptoms: dysuria and polyuria   Associated symptoms: no abdominal pain, no chest pain, no fever, no nausea and no vomiting     Past Medical History  Diagnosis Date  . Hypertension   . Asthma   . Hyperlipidemia   . Gout, unspecified   . CAD in native artery 01/22/2014  . S/P PTCA  (percutaneous transluminal coronary angioplasty)01/22/14 01/22/2014    a LHC (12/2013): successful PTCA second obtuse marginal, contained 99% stenosis within a very tortuous segment, reduction from 99% to less than or equal to 50% with TIMI grade 3 flow, 80-90% stenosis in mid PDA, Irregularities less than 50% in mid RCA, pox LAD and prox LCx  . Chronic systolic heart failure     a. ICM b. ECHO (01/2014): EF 25-30%, grade I DD, trivial MR  . CHF (congestive heart failure)   . NSTEMI (non-ST elevated myocardial infarction) 2014; 12/2013    "didn't know I'd had it til 12/2013; in the process of having one when I came in"  . Environmental allergies   . Pneumonia     "2-3 times" (01/25/2014)  . Hyperthyroidism   . Type II diabetes mellitus   . GERD (gastroesophageal reflux disease)   . Stroke 1990's    "mild", denies residual   Past Surgical History  Procedure Laterality Date  . Shoulder open rotator cuff repair Bilateral 1980's - 1990's  . Total knee arthroplasty Right 2009  . Cesarean section  [id number]; 1960  . Abdominal hysterectomy    . Appendectomy  1959  . Cholecystectomy  1989  . Joint replacement    . Reduction mammaplasty Bilateral 1990's  . Cataract extraction w/ intraocular lens  implant, bilateral Bilateral 2013  . Coronary angioplasty  12/2013   Family History  Problem Relation Age of Onset  . Hypertension Mother   . Diabetes Mother   . Heart attack Mother 40  . Heart disease Father   . Diabetes Brother   . Heart disease Brother  History  Substance Use Topics  . Smoking status: Never Smoker   . Smokeless tobacco: Never Used  . Alcohol Use: No   OB History   Grav Para Term Preterm Abortions TAB SAB Ect Mult Living                 Review of Systems  Constitutional: Negative for fever.  HENT: Negative for rhinorrhea and sore throat.   Eyes: Negative for redness.  Respiratory: Negative for cough.   Cardiovascular: Negative for chest pain.  Gastrointestinal: Negative  for nausea, vomiting, abdominal pain and diarrhea.  Endocrine: Positive for polyuria.  Genitourinary: Positive for dysuria and frequency.  Musculoskeletal: Negative for myalgias.  Skin: Negative for rash.  Neurological: Positive for weakness. Negative for headaches.   Allergies  Contrast media; Cortizone-10; and Zocor  Home Medications   Prior to Admission medications   Medication Sig Start Date End Date Taking? Authorizing Provider  albuterol (PROVENTIL HFA;VENTOLIN HFA) 108 (90 BASE) MCG/ACT inhaler Inhale 1 puff into the lungs every 6 (six) hours as needed for wheezing or shortness of breath.   Yes Historical Provider, MD  allopurinol (ZYLOPRIM) 300 MG tablet Take 300 mg by mouth daily.   Yes Historical Provider, MD  aspirin EC 81 MG EC tablet Take 1 tablet (81 mg total) by mouth daily. 01/25/14  Yes Rande Brunt, NP  atorvastatin (LIPITOR) 80 MG tablet Take 1 tablet (80 mg total) by mouth daily at 6 PM. 01/25/14  Yes Rande Brunt, NP  Biotin 5000 MCG CAPS Take 1 capsule by mouth daily.   Yes Historical Provider, MD  Canagliflozin (INVOKANA) 300 MG TABS Take 300 mg by mouth daily.   Yes Historical Provider, MD  carvedilol (COREG) 6.25 MG tablet Take 1 tablet (6.25 mg total) by mouth 2 (two) times daily with a meal. 01/25/14  Yes Rande Brunt, NP  colchicine 0.6 MG tablet Take 0.6 mg by mouth daily.    Yes Historical Provider, MD  furosemide (LASIX) 20 MG tablet Take 1 tablet (20 mg total) by mouth daily. 01/27/14  Yes Rande Brunt, NP  Lactobacillus (PROBIOTIC ACIDOPHILUS) TABS Take 1 tablet by mouth daily.    Yes Historical Provider, MD  metFORMIN (GLUMETZA) 1000 MG (MOD) 24 hr tablet Take 1,000 mg by mouth 2 (two) times daily with a meal.   Yes Historical Provider, MD  nitroGLYCERIN (NITROSTAT) 0.4 MG SL tablet Place 0.4 mg under the tongue every 5 (five) minutes as needed for chest pain.   Yes Historical Provider, MD  omeprazole (PRILOSEC) 20 MG capsule Take 20 mg by mouth daily.    Yes Historical Provider, MD  spironolactone (ALDACTONE) 25 MG tablet Take 0.5 tablets (12.5 mg total) by mouth daily. 01/25/14  Yes Rande Brunt, NP  thyroid (ARMOUR) 60 MG tablet Take 120 mg by mouth daily. Take two tabs once a day for 90 days (09/04/2012)   Yes Historical Provider, MD  ticagrelor (BRILINTA) 90 MG TABS tablet Take 1 tablet (90 mg total) by mouth 2 (two) times daily. 01/25/14  Yes Rande Brunt, NP  glipiZIDE (GLUCOTROL) 5 MG tablet Take 1 tablet (5 mg total) by mouth 2 (two) times daily before a meal. 02/01/14   Jolaine Artist, MD   BP 127/60  Pulse 75  Temp(Src) 97.9 F (36.6 C) (Oral)  Resp 16  Wt 178 lb 5 oz (80.882 kg)  SpO2 98%  Physical Exam  Nursing note and vitals reviewed. Constitutional: She appears well-developed and  well-nourished.  HENT:  Head: Normocephalic and atraumatic.  Mouth/Throat: Mucous membranes are dry.  Eyes: Conjunctivae are normal. Right eye exhibits no discharge. Left eye exhibits no discharge.  Neck: Normal range of motion. Neck supple.  Cardiovascular: Normal rate, regular rhythm and normal heart sounds.   No murmur heard. Pulmonary/Chest: Effort normal and breath sounds normal. No respiratory distress. She has no wheezes. She has no rales.  Abdominal: Soft. There is no tenderness. There is no rebound and no guarding.  Musculoskeletal: She exhibits no edema and no tenderness.  Neurological: She is alert.  Skin: Skin is warm and dry.  Psychiatric: She has a normal mood and affect.    ED Course  Procedures (including critical care time) Labs Review Labs Reviewed  CBC WITH DIFFERENTIAL - Abnormal; Notable for the following:    Hemoglobin 11.8 (*)    All other components within normal limits  COMPREHENSIVE METABOLIC PANEL - Abnormal; Notable for the following:    Sodium 133 (*)    Chloride 92 (*)    Glucose, Bld 398 (*)    BUN 47 (*)    Creatinine, Ser 1.51 (*)    Alkaline Phosphatase 161 (*)    GFR calc non Af Amer 33 (*)     GFR calc Af Amer 38 (*)    Anion gap 21 (*)    All other components within normal limits  URINALYSIS, ROUTINE W REFLEX MICROSCOPIC - Abnormal; Notable for the following:    Glucose, UA >1000 (*)    Leukocytes, UA SMALL (*)    All other components within normal limits  URINE MICROSCOPIC-ADD ON - Abnormal; Notable for the following:    Bacteria, UA MANY (*)    Casts GRANULAR CAST (*)    All other components within normal limits  CBG MONITORING, ED - Abnormal; Notable for the following:    Glucose-Capillary 331 (*)    All other components within normal limits  URINE CULTURE    Imaging Review No results found.   EKG Interpretation None      3:06 PM Patient seen and examined. Work-up initiated. Medications ordered. Patient discussed with Dr. Thurnell Garbe.   Vital signs reviewed and are as follows: BP 127/60  Pulse 75  Temp(Src) 97.9 F (36.6 C) (Oral)  Resp 16  Wt 178 lb 5 oz (80.882 kg)  SpO2 98%  4:41 PM Elevated AG with no ketones in urine. Will give SQ insulin. 1L given to this point. Will admit for dehydration, hyperglycemia, gentle hydration.   Consulted with Dr. Karleen Hampshire who will see.    MDM   Final diagnoses:  Hyperglycemia  Acute cystitis without hematuria  Chronic systolic HF (heart failure)  Dehydration   Admit for above.     Carlisle Cater, PA-C 02/01/14 1642

## 2014-02-01 NOTE — ED Notes (Addendum)
Was seen at dr office today and they took blood and her cbg was 400 no cp some sob  Feels weak states was just taken off her sugar meds

## 2014-02-01 NOTE — Addendum Note (Signed)
Encounter addended by: Kerry Dory, CMA on: 02/01/2014 10:53 AM<BR>     Documentation filed: Patient Instructions Section, Orders

## 2014-02-01 NOTE — Addendum Note (Signed)
Encounter addended by: Jolaine Artist, MD on: 02/01/2014 10:55 AM<BR>     Documentation filed: Notes Section

## 2014-02-01 NOTE — ED Notes (Signed)
Attempted report x1. 

## 2014-02-02 ENCOUNTER — Institutional Professional Consult (permissible substitution): Payer: Medicare Other | Admitting: Critical Care Medicine

## 2014-02-02 DIAGNOSIS — I5022 Chronic systolic (congestive) heart failure: Secondary | ICD-10-CM

## 2014-02-02 DIAGNOSIS — N3000 Acute cystitis without hematuria: Secondary | ICD-10-CM

## 2014-02-02 LAB — GLUCOSE, CAPILLARY
Glucose-Capillary: 234 mg/dL — ABNORMAL HIGH (ref 70–99)
Glucose-Capillary: 283 mg/dL — ABNORMAL HIGH (ref 70–99)

## 2014-02-02 MED ORDER — CANAGLIFLOZIN 300 MG PO TABS: 300.0000 mg | ORAL_TABLET | Freq: Every day | ORAL | Status: DC

## 2014-02-02 MED ORDER — CIPROFLOXACIN HCL 250 MG PO TABS: 500.0000 mg | ORAL_TABLET | Freq: Two times a day (BID) | ORAL | Status: DC

## 2014-02-02 MED ORDER — GLIPIZIDE 5 MG PO TABS: 5.0000 mg | ORAL_TABLET | Freq: Two times a day (BID) | ORAL | Status: DC

## 2014-02-02 NOTE — Discharge Summary (Addendum)
Physician Discharge Summary  [name] [id number] DOB: April 16, 1936 DOA: 02/01/2014  PCP: Lillard Anes, MD  Admit date: 02/01/2014 Discharge date: 02/02/2014  Time spent: 35 minutes  Recommendations for Outpatient Follow-up:  Needs follow up CBG, and adjust medications as needed.  Need B-me to follow renal function.  Patient aware to resume lasix if her weight increase to 185 pounds.   Discharge Diagnoses:    Type II or unspecified type diabetes mellitus without mention of complication, not stated as uncontrolled   Unspecified essential hypertension   Systolic HF Ef 25 %   Hyperlipidemia   Hyperglycemia   Dehydration   UTI.    Discharge Condition: Stable.   Diet recommendation: Carb Modified.   Filed Weights   02/01/14 1417 02/01/14 1821 02/02/14 0500  Weight: 80.882 kg (178 lb 5 oz) 82.373 kg (181 lb 9.6 oz) 82.056 kg (180 lb 14.4 oz)    History of present illness:  Carol [name] is a 78 y.o. female with prior h/o CAD, s/p NSTEMI with PCI, DM, comes in for fatigue , generalized weakness and saw her cardiologist in the office today and was sent to ED for for further evaluation . On arrival to ED, she was found to have elevated cbg's in 400's with AG of 21. Patient was given 10 units of novo log by the time i saw her. She also found to be dehydrated and has orthostatic hypotension. She also reports dizziness on walking, but no episodes of syncope. UA revealed mild UTI, and she reports symptoms of dysuria last few days. She denies any other complaints. She is referred to [facility] for admission for hydration, uncontrolled DM and UTI.    Hospital Course:  Uncontrolled diabetes Mellitus:  - admitted to telemetry. Patient received 10 units of novolog in ED. Her cbg is in 400's. - stop metformin and resume SSI.  - her hgba1c is 9.1, -She was started on lantus. Patient has not been taking any medication for diabetes, she never pick up glipizide or invokana from pharmacy.  She decline going home on insuline. I will provide prescription for glipizide and invokana. She was advised to work on diet.   Recent NSTEMI , CAD  - resume coreg and brilinta.   Systolic HF Ef 25 % ; holding lasix due to dehydration, AKI. She will resume lasix if her weight increase to 185 pounds. No ACE due to renal insufficiency.   UTI:  Urine cultures sent and on rocephin.  Culture pending. She does relates dysuria. She will be discharge on cipro for 3 days.   Dehydration and orthostatic hypotension:  - holding lasix and spironolactone, gentle hydration.  -She is feeling better after IV fluids. She was advised to resume lasix if weight increased to 185, as states Dr Missy Sabins on his note on 8-10.   DVT prophylaxis.  CKD stage 2 to3:  Slight worsening when compared to baseline.  Improved with IV fluids.    Procedures:  none  Consultations:  none  Discharge Exam: Filed Vitals:   02/02/14 0500  BP: 135/64  Pulse: 77  Temp: 97.9 F (36.6 C)  Resp: 18    General:No distress.  Cardiovascular: S 1, S 2 RRR Respiratory: CTA  Discharge Instructions You were cared for by a hospitalist during your hospital stay. If you have any questions about your discharge medications or the care you received while you were in the hospital after you are discharged, you can call the unit and asked to speak with the  hospitalist on call if the hospitalist that took care of you is not available. Once you are discharged, your primary care physician will handle any further medical issues. Please note that NO REFILLS for any discharge medications will be authorized once you are discharged, as it is imperative that you return to your primary care physician (or establish a relationship with a primary care physician if you do not have one) for your aftercare needs so that they can reassess your need for medications and monitor your lab values.  Discharge Instructions   Diet Carb Modified    Complete  by:  As directed      Increase activity slowly    Complete by:  As directed             Medication List    STOP taking these medications       furosemide 20 MG tablet  Commonly known as:  LASIX     metFORMIN 1000 MG (MOD) 24 hr tablet  Commonly known as:  GLUMETZA     spironolactone 25 MG tablet  Commonly known as:  ALDACTONE      TAKE these medications       albuterol 108 (90 BASE) MCG/ACT inhaler  Commonly known as:  PROVENTIL HFA;VENTOLIN HFA  Inhale 1 puff into the lungs every 6 (six) hours as needed for wheezing or shortness of breath.     allopurinol 300 MG tablet  Commonly known as:  ZYLOPRIM  Take 300 mg by mouth daily.     aspirin 81 MG EC tablet  Take 1 tablet (81 mg total) by mouth daily.     atorvastatin 80 MG tablet  Commonly known as:  LIPITOR  Take 1 tablet (80 mg total) by mouth daily at 6 PM.     Biotin 5000 MCG Caps  Take 1 capsule by mouth daily.     Canagliflozin 300 MG Tabs  Commonly known as:  INVOKANA  Take 1 tablet (300 mg total) by mouth daily.     carvedilol 6.25 MG tablet  Commonly known as:  COREG  Take 1 tablet (6.25 mg total) by mouth 2 (two) times daily with a meal.     ciprofloxacin 250 MG tablet  Commonly known as:  CIPRO  Take 2 tablets (500 mg total) by mouth 2 (two) times daily.     colchicine 0.6 MG tablet  Take 0.6 mg by mouth daily.     glipiZIDE 5 MG tablet  Commonly known as:  GLUCOTROL  Take 1 tablet (5 mg total) by mouth 2 (two) times daily before a meal.     nitroGLYCERIN 0.4 MG SL tablet  Commonly known as:  NITROSTAT  Place 0.4 mg under the tongue every 5 (five) minutes as needed for chest pain.     omeprazole 20 MG capsule  Commonly known as:  PRILOSEC  Take 20 mg by mouth daily.     PROBIOTIC ACIDOPHILUS Tabs  Take 1 tablet by mouth daily.     thyroid 60 MG tablet  Commonly known as:  ARMOUR  Take 120 mg by mouth daily. Take two tabs once a day for 90 days (09/04/2012)     ticagrelor 90 MG Tabs  tablet  Commonly known as:  BRILINTA  Take 1 tablet (90 mg total) by mouth 2 (two) times daily.       Allergies  Allergen Reactions  . Contrast Media [Iodinated Diagnostic Agents] Other (See Comments)    unknown  . Cortizone-10 [Hydrocortisone] Other (  See Comments)    unknown  . Zocor [Simvastatin]     Muscle aches- severe       Follow-up Information   Follow up with PERRY,LAWRENCE EDWARD, MD In 1 week.   Specialty:  Family Medicine   Contact information:   [address] [place] [address] [phone]        The results of significant diagnostics from this hospitalization (including imaging, microbiology, ancillary and laboratory) are listed below for reference.    Significant Diagnostic Studies: Ct Chest Wo Contrast  01/22/2014   CLINICAL DATA:  Intermittent chest pain for 1 month worse last night described is squeezing sharp pain associated was shortness of breath, lightheadedness and nausea, diaphoresis, history hypertension, diabetes, gout, asthma  EXAM: CT CHEST WITHOUT CONTRAST  TECHNIQUE: Multidetector CT imaging of the chest was performed following the standard protocol without IV contrast. Sagittal and coronal MPR images reconstructed from axial data set. IV contrast not administered due to history of contrast allergy.  COMPARISON:  05/09/2009 CT chest, chest radiograph 01/22/2014  FINDINGS: Scattered atherosclerotic calcifications aorta and coronary arteries.  No thoracic adenopathy.  Moderate-sized hiatal hernia.  Calcified granulomata within spleen.  Remaining visualized portions of upper abdomen unremarkable.  Suspect tiny LEFT lower lobe tiny calcified granuloma image 23 present on previous exam as well.  Lungs clear.  No infiltrate, pleural effusion or pneumothorax.  Bones unremarkable.  IMPRESSION: Old granulomatous disease.  No acute intra thoracic abnormalities.   Electronically Signed   By: Lavonia Dana M.D.   On: 01/22/2014 13:12   Dg Chest Port 1  View  01/22/2014   CLINICAL DATA:  Chest pain and shortness of breath.  EXAM: PORTABLE CHEST - 1 VIEW  COMPARISON:  12/18/2010  FINDINGS: Lungs are adequately inflated without consolidation or effusion. Cardiomediastinal silhouette and remainder of the exam is unchanged.  IMPRESSION: No active disease.   Electronically Signed   By: Marin Olp M.D.   On: 01/22/2014 11:16    Microbiology: No results found for this or any previous visit (from the past 240 hour(s)).   Labs: Basic Metabolic Panel:  Recent Labs Lab 02/01/14 1130 02/01/14 1421 02/01/14 1855  NA 133* 133* 137  K 5.4* 5.0 4.2  CL 91* 92* 98  CO2 21 20 20   GLUCOSE 435* 398* 176*  BUN 47* 47* 43*  CREATININE 1.39* 1.51* 1.24*  CALCIUM 10.0 9.9 9.6   Liver Function Tests:  Recent Labs Lab 02/01/14 1130 02/01/14 1421  AST 28 18  ALT 19 18  ALKPHOS 166* 161*  BILITOT 0.5 0.4  PROT 7.9 7.7  ALBUMIN 4.2 4.1   No results found for this basename: LIPASE, AMYLASE,  in the last 168 hours No results found for this basename: AMMONIA,  in the last 168 hours CBC:  Recent Labs Lab 02/01/14 1130 02/01/14 1421  WBC 8.2 7.1  NEUTROABS 5.1 4.3  HGB 12.6 11.8*  HCT 38.4 36.5  MCV 86.3 86.1  PLT 319 299   Cardiac Enzymes: No results found for this basename: CKTOTAL, CKMB, CKMBINDEX, TROPONINI,  in the last 168 hours BNP: BNP (last 3 results)  Recent Labs  01/22/14 1104  PROBNP 202.7   CBG:  Recent Labs Lab 02/01/14 1623 02/01/14 1748 02/01/14 2214 02/02/14 0532 02/02/14 1047  GLUCAP 331* 279* 265* 234* 283*       Signed:  Rosland Riding A  [facility] 02/02/2014, 11:22 AM

## 2014-02-02 NOTE — Care Management Note (Signed)
    Page 1 of 1   02/02/2014     3:25:27 PM CARE MANAGEMENT NOTE 02/02/2014  Patient:  Carol [name], Carol [name]   Account Number:  [id number]  Date Initiated:  02/02/2014  Documentation initiated by:  [facility]  Subjective/Objective Assessment:   78 y.o. female with prior h/o CAD, s/p NSTEMI with PCI, DM, comes in for fatigue , generalized weakness and saw her cardiologist in the office today and was sent to ED for for further evaluation.//Home alone     Action/Plan:   Insulin, IV abx.//Cardiac Rehab consult per HF clinc   Anticipated DC Date:  02/02/2014   Anticipated DC Plan:  [place]  CM consult  HF Clinic      Choice offered to / List presented to:             Status of service:  Completed, signed off Medicare Important Message given?  NA - LOS <3 / Initial given by admissions (If response is "NO", the following Medicare IM given date fields will be blank) Date Medicare IM given:   Medicare IM given by:   Date Additional Medicare IM given:   Additional Medicare IM given by:    Discharge Disposition:  HOME/SELF CARE  Per UR Regulation:  Reviewed for med. necessity/level of care/duration of stay  If discussed at [place] of Stay Meetings, dates discussed:    Comments:  02/02/14 [place], RN, BSN, [place] [phone] Consult from Dr Tyrell Antonio concerning Cardiac Rehab OP.  Pt was supposed to be set up on last admission.  Spoke with Dante Gang, RN HF Navigator to confirm consult has been made to Cardiac Rehab.

## 2014-02-02 NOTE — Progress Notes (Signed)
Inpatient Diabetes Program Recommendations  AACE/ADA: New Consensus Statement on Inpatient Glycemic Control (2013)  Target Ranges:  Prepandial:   less than 140 mg/dL      Peak postprandial:   less than 180 mg/dL (1-2 hours)      Critically ill patients:  140 - 180 mg/dL   Results for Carol [name], Carol [name] (MRN [id number]) as of 02/02/2014 09:57  Ref. Range 02/01/2014 16:23 02/01/2014 17:48 02/01/2014 22:14 02/02/2014 05:32  Glucose-Capillary Latest Range: 70-99 mg/dL 331 (H) 279 (H) 265 (H) 234 (H)   Diabetes history: DM2 Outpatient Diabetes medications: Invokana 300 mg daily, Metformin 1000 mg BID, Glipizide 5 mg BID Current orders for Inpatient glycemic control: Lantus 8 units QHS, Novolog 0-15 units AC, Novolog 0-5 units HS  Inpatient Diabetes Program Recommendations Insulin - Basal: Please consider increasing Lantus to 12 units QHS.   Thanks, Lucious Groves, RD, CDE, M.Ed.

## 2014-02-02 NOTE — Discharge Instructions (Signed)
Start taking lasix if you weight increase to 185 pounds.

## 2014-02-04 LAB — URINE CULTURE: Colony Count: >=100000

## 2014-02-04 NOTE — ED Provider Notes (Signed)
Medical screening examination/treatment/procedure(s) were conducted as a shared visit with non-physician practitioner(s) and myself.  I personally evaluated the patient during the encounter. 78yo F, c/o fatigue, hyperglycemia, polyuria, lightheadedness with walking. Pt was recently d/c from the hospital with her metformin discontinued, and was to start glipizide, but she did not receive the rx. Pt has not been taking any DM meds for the past week. Pt was evaluated by her Cards MD today, noted to be over diuresed, then sent to the ED for further evaluation. Denies CP/SOB, no abd pain, no N/V/D, no fevers.  VSS, A&O, NAD, CTA, RRR, dry MM, neuro non-focal. +orthostatic on VS. Glucose in 400's, AG 21, BUN/Cr elevated from baseline, +UTI/UC pending. Gentle IVF, insulin, admit.       Francine Graven, DO 02/04/14 [phone]

## 2014-02-10 ENCOUNTER — Telehealth (HOSPITAL_COMMUNITY): Payer: Self-pay | Admitting: *Deleted

## 2014-02-10 NOTE — Telephone Encounter (Signed)
Pt called earlier to c/o increased SOB and fatigue, she states her weight is down to 177 lb was 179 at d/c 8/11, d/c summary states to stop spiro and lasix unless wt gets to 185 however pt states she was told to take lasix 1/2 tab (10 mg) daily and that is what she has been doing, denies LE edema but states her abd is swollen.  Discussed w/Dr Bensimhon he would like for pt to take laisx 40 mg now and then tomorrow start taking 20 mg daily f/u with [place] next week, pt is aware and agreeable, appt sch for Mon at 8/24

## 2014-02-15 ENCOUNTER — Encounter (HOSPITAL_COMMUNITY): Payer: Self-pay | Admitting: Emergency Medicine

## 2014-02-15 ENCOUNTER — Encounter (HOSPITAL_COMMUNITY): Payer: Medicare Other

## 2014-02-15 ENCOUNTER — Ambulatory Visit (HOSPITAL_BASED_OUTPATIENT_CLINIC_OR_DEPARTMENT_OTHER)
Admission: RE | Admit: 2014-02-15 | Discharge: 2014-02-15 | Disposition: A | Discharge status: Home / Self Care | Payer: Medicare Other | Source: Ambulatory Visit | Attending: Cardiology | Admitting: Cardiology

## 2014-02-15 ENCOUNTER — Telehealth (HOSPITAL_COMMUNITY): Payer: Self-pay

## 2014-02-15 ENCOUNTER — Inpatient Hospital Stay (HOSPITAL_COMMUNITY)
Admission: EM | Admit: 2014-02-15 | Discharge: 2014-02-19 | DRG: 982 | Disposition: A | Discharge status: Home / Self Care | Payer: Medicare Other | Attending: Internal Medicine | Admitting: Internal Medicine

## 2014-02-15 ENCOUNTER — Encounter (HOSPITAL_COMMUNITY): Payer: Self-pay

## 2014-02-15 VITALS — BP 150/70 | HR 88 | Resp 20 | Wt 178.0 lb

## 2014-02-15 DIAGNOSIS — Z8673 Personal history of transient ischemic attack (TIA), and cerebral infarction without residual deficits: Secondary | ICD-10-CM | POA: Diagnosis not present

## 2014-02-15 DIAGNOSIS — J45909 Unspecified asthma, uncomplicated: Secondary | ICD-10-CM | POA: Diagnosis present

## 2014-02-15 DIAGNOSIS — Z7982 Long term (current) use of aspirin: Secondary | ICD-10-CM

## 2014-02-15 DIAGNOSIS — Z79899 Other long term (current) drug therapy: Secondary | ICD-10-CM | POA: Diagnosis not present

## 2014-02-15 DIAGNOSIS — I5022 Chronic systolic (congestive) heart failure: Secondary | ICD-10-CM | POA: Insufficient documentation

## 2014-02-15 DIAGNOSIS — M109 Gout, unspecified: Secondary | ICD-10-CM | POA: Insufficient documentation

## 2014-02-15 DIAGNOSIS — I252 Old myocardial infarction: Secondary | ICD-10-CM

## 2014-02-15 DIAGNOSIS — N184 Chronic kidney disease, stage 4 (severe): Secondary | ICD-10-CM | POA: Diagnosis present

## 2014-02-15 DIAGNOSIS — I200 Unstable angina: Secondary | ICD-10-CM | POA: Diagnosis present

## 2014-02-15 DIAGNOSIS — E1122 Type 2 diabetes mellitus with diabetic chronic kidney disease: Secondary | ICD-10-CM | POA: Diagnosis present

## 2014-02-15 DIAGNOSIS — E1100 Type 2 diabetes mellitus with hyperosmolarity without nonketotic hyperglycemic-hyperosmolar coma (NKHHC): Secondary | ICD-10-CM | POA: Diagnosis present

## 2014-02-15 DIAGNOSIS — E039 Hypothyroidism, unspecified: Secondary | ICD-10-CM | POA: Diagnosis present

## 2014-02-15 DIAGNOSIS — I208 Other forms of angina pectoris: Secondary | ICD-10-CM

## 2014-02-15 DIAGNOSIS — K219 Gastro-esophageal reflux disease without esophagitis: Secondary | ICD-10-CM | POA: Diagnosis present

## 2014-02-15 DIAGNOSIS — I214 Non-ST elevation (NSTEMI) myocardial infarction: Secondary | ICD-10-CM

## 2014-02-15 DIAGNOSIS — I951 Orthostatic hypotension: Secondary | ICD-10-CM | POA: Diagnosis present

## 2014-02-15 DIAGNOSIS — Z9861 Coronary angioplasty status: Secondary | ICD-10-CM

## 2014-02-15 DIAGNOSIS — I10 Essential (primary) hypertension: Secondary | ICD-10-CM

## 2014-02-15 DIAGNOSIS — I509 Heart failure, unspecified: Secondary | ICD-10-CM | POA: Diagnosis present

## 2014-02-15 DIAGNOSIS — I2510 Atherosclerotic heart disease of native coronary artery without angina pectoris: Secondary | ICD-10-CM | POA: Insufficient documentation

## 2014-02-15 DIAGNOSIS — R739 Hyperglycemia, unspecified: Secondary | ICD-10-CM

## 2014-02-15 DIAGNOSIS — R079 Chest pain, unspecified: Secondary | ICD-10-CM

## 2014-02-15 DIAGNOSIS — I5032 Chronic diastolic (congestive) heart failure: Secondary | ICD-10-CM | POA: Clinically undetermined

## 2014-02-15 DIAGNOSIS — I2589 Other forms of chronic ischemic heart disease: Secondary | ICD-10-CM | POA: Diagnosis present

## 2014-02-15 DIAGNOSIS — I129 Hypertensive chronic kidney disease with stage 1 through stage 4 chronic kidney disease, or unspecified chronic kidney disease: Secondary | ICD-10-CM | POA: Diagnosis present

## 2014-02-15 DIAGNOSIS — N183 Chronic kidney disease, stage 3 unspecified: Secondary | ICD-10-CM | POA: Diagnosis present

## 2014-02-15 DIAGNOSIS — Z96659 Presence of unspecified artificial knee joint: Secondary | ICD-10-CM

## 2014-02-15 DIAGNOSIS — H908 Mixed conductive and sensorineural hearing loss, unspecified: Secondary | ICD-10-CM

## 2014-02-15 DIAGNOSIS — IMO0002 Reserved for concepts with insufficient information to code with codable children: Secondary | ICD-10-CM

## 2014-02-15 DIAGNOSIS — E785 Hyperlipidemia, unspecified: Secondary | ICD-10-CM | POA: Diagnosis present

## 2014-02-15 DIAGNOSIS — E0590 Thyrotoxicosis, unspecified without thyrotoxic crisis or storm: Secondary | ICD-10-CM | POA: Insufficient documentation

## 2014-02-15 DIAGNOSIS — R9439 Abnormal result of other cardiovascular function study: Secondary | ICD-10-CM | POA: Diagnosis not present

## 2014-02-15 DIAGNOSIS — Z955 Presence of coronary angioplasty implant and graft: Secondary | ICD-10-CM

## 2014-02-15 DIAGNOSIS — E1165 Type 2 diabetes mellitus with hyperglycemia: Secondary | ICD-10-CM | POA: Diagnosis present

## 2014-02-15 DIAGNOSIS — N390 Urinary tract infection, site not specified: Secondary | ICD-10-CM | POA: Diagnosis present

## 2014-02-15 DIAGNOSIS — E860 Dehydration: Secondary | ICD-10-CM | POA: Diagnosis present

## 2014-02-15 DIAGNOSIS — E119 Type 2 diabetes mellitus without complications: Secondary | ICD-10-CM | POA: Insufficient documentation

## 2014-02-15 DIAGNOSIS — E1101 Type 2 diabetes mellitus with hyperosmolarity with coma: Secondary | ICD-10-CM

## 2014-02-15 DIAGNOSIS — I209 Angina pectoris, unspecified: Secondary | ICD-10-CM

## 2014-02-15 DIAGNOSIS — E118 Type 2 diabetes mellitus with unspecified complications: Secondary | ICD-10-CM

## 2014-02-15 DIAGNOSIS — I237 Postinfarction angina: Secondary | ICD-10-CM | POA: Diagnosis present

## 2014-02-15 DIAGNOSIS — E875 Hyperkalemia: Secondary | ICD-10-CM

## 2014-02-15 DIAGNOSIS — E1129 Type 2 diabetes mellitus with other diabetic kidney complication: Secondary | ICD-10-CM | POA: Diagnosis not present

## 2014-02-15 DIAGNOSIS — R6884 Jaw pain: Secondary | ICD-10-CM

## 2014-02-15 DIAGNOSIS — I255 Ischemic cardiomyopathy: Secondary | ICD-10-CM | POA: Diagnosis present

## 2014-02-15 DIAGNOSIS — N1832 Chronic kidney disease, stage 3b: Secondary | ICD-10-CM | POA: Diagnosis present

## 2014-02-15 HISTORY — DX: Coronary angioplasty status: Z98.61

## 2014-02-15 HISTORY — DX: Atherosclerotic heart disease of native coronary artery without angina pectoris: I25.10

## 2014-02-15 LAB — CBC WITH DIFFERENTIAL/PLATELET
Basophils Absolute: 0 10*3/uL (ref 0.0–0.1)
Basophils Relative: 0 % (ref 0–1)
Eosinophils Absolute: 0 10*3/uL (ref 0.0–0.7)
Eosinophils Relative: 1 % (ref 0–5)
HEMATOCRIT: 37.2 % (ref 36.0–46.0)
HEMOGLOBIN: 12.1 g/dL (ref 12.0–15.0)
LYMPHS ABS: 1.7 10*3/uL (ref 0.7–4.0)
LYMPHS PCT: 30 % (ref 12–46)
MCH: 27.5 pg (ref 26.0–34.0)
MCHC: 32.5 g/dL (ref 30.0–36.0)
MCV: 84.5 fL (ref 78.0–100.0)
MONO ABS: 0.5 10*3/uL (ref 0.1–1.0)
Monocytes Relative: 10 % (ref 3–12)
NEUTROS ABS: 3.3 10*3/uL (ref 1.7–7.7)
Neutrophils Relative %: 60 % (ref 43–77)
Platelets: 206 10*3/uL (ref 150–400)
RBC: 4.4 MIL/uL (ref 3.87–5.11)
RDW: 15.5 % (ref 11.5–15.5)
WBC: 5.5 10*3/uL (ref 4.0–10.5)

## 2014-02-15 LAB — I-STAT CG4 LACTIC ACID, ED: LACTIC ACID, VENOUS: 2.18 mmol/L (ref 0.5–2.2)

## 2014-02-15 LAB — BASIC METABOLIC PANEL
ANION GAP: 18 — AB (ref 5–15)
ANION GAP: 16 — AB (ref 5–15)
Anion gap: 18 — ABNORMAL HIGH (ref 5–15)
Anion gap: 15 (ref 5–15)
BUN: 56 mg/dL — AB (ref 6–23)
BUN: 56 mg/dL — ABNORMAL HIGH (ref 6–23)
BUN: 49 mg/dL — AB (ref 6–23)
BUN: 46 mg/dL — ABNORMAL HIGH (ref 6–23)
CALCIUM: 9.7 mg/dL (ref 8.4–10.5)
CHLORIDE: 90 meq/L — AB (ref 96–112)
CHLORIDE: 102 meq/L (ref 96–112)
CO2: 18 meq/L — AB (ref 19–32)
CO2: 18 meq/L — AB (ref 19–32)
CO2: 20 mEq/L (ref 19–32)
CO2: 21 mEq/L (ref 19–32)
CREATININE: 1.4 mg/dL — AB (ref 0.50–1.10)
CREATININE: 1.34 mg/dL — AB (ref 0.50–1.10)
CREATININE: 1.19 mg/dL — AB (ref 0.50–1.10)
Calcium: 9.4 mg/dL (ref 8.4–10.5)
Calcium: 9.8 mg/dL (ref 8.4–10.5)
Calcium: 9.6 mg/dL (ref 8.4–10.5)
Chloride: 89 mEq/L — ABNORMAL LOW (ref 96–112)
Chloride: 100 mEq/L (ref 96–112)
Creatinine, Ser: 1.16 mg/dL — ABNORMAL HIGH (ref 0.50–1.10)
GFR calc Af Amer: 44 mL/min — ABNORMAL LOW (ref >90)
GFR calc Af Amer: 50 mL/min — ABNORMAL LOW (ref >90)
GFR calc Af Amer: 52 mL/min — ABNORMAL LOW (ref >90)
GFR calc non Af Amer: 36 mL/min — ABNORMAL LOW (ref >90)
GFR calc non Af Amer: 38 mL/min — ABNORMAL LOW (ref >90)
GFR, EST AFRICAN AMERICAN: 41 mL/min — AB (ref >90)
GFR, EST NON AFRICAN AMERICAN: 44 mL/min — AB (ref >90)
GFR, EST NON AFRICAN AMERICAN: 45 mL/min — AB (ref >90)
GLUCOSE: 783 mg/dL — AB (ref 70–99)
GLUCOSE: 779 mg/dL — AB (ref 70–99)
GLUCOSE: 197 mg/dL — AB (ref 70–99)
Glucose, Bld: 135 mg/dL — ABNORMAL HIGH (ref 70–99)
POTASSIUM: 4.9 meq/L (ref 3.7–5.3)
POTASSIUM: 4.5 meq/L (ref 3.7–5.3)
Potassium: 5.3 mEq/L (ref 3.7–5.3)
Potassium: 4 mEq/L (ref 3.7–5.3)
Sodium: 126 mEq/L — ABNORMAL LOW (ref 137–147)
Sodium: 125 mEq/L — ABNORMAL LOW (ref 137–147)
Sodium: 136 mEq/L — ABNORMAL LOW (ref 137–147)
Sodium: 138 mEq/L (ref 137–147)

## 2014-02-15 LAB — URINALYSIS, ROUTINE W REFLEX MICROSCOPIC
BILIRUBIN URINE: NEGATIVE
Hgb urine dipstick: NEGATIVE
Ketones, ur: NEGATIVE mg/dL
LEUKOCYTES UA: NEGATIVE
NITRITE: NEGATIVE
PH: 5 (ref 5.0–8.0)
Protein, ur: NEGATIVE mg/dL
SPECIFIC GRAVITY, URINE: 1.02 (ref 1.005–1.030)
Urobilinogen, UA: 0.2 mg/dL (ref 0.0–1.0)

## 2014-02-15 LAB — CBG MONITORING, ED
GLUCOSE-CAPILLARY: 409 mg/dL — AB (ref 70–99)
GLUCOSE-CAPILLARY: 238 mg/dL — AB (ref 70–99)
GLUCOSE-CAPILLARY: 120 mg/dL — AB (ref 70–99)
Glucose-Capillary: >600 mg/dL (ref 70–99)
Glucose-Capillary: >600 mg/dL (ref 70–99)
Glucose-Capillary: 152 mg/dL — ABNORMAL HIGH (ref 70–99)
Glucose-Capillary: 132 mg/dL — ABNORMAL HIGH (ref 70–99)

## 2014-02-15 LAB — TROPONIN I
Troponin I: <0.3 ng/mL (ref <0.30)
Troponin I: <0.3 ng/mL (ref <0.30)
Troponin I: <0.3 ng/mL (ref <0.30)

## 2014-02-15 LAB — GLUCOSE, CAPILLARY
GLUCOSE-CAPILLARY: 190 mg/dL — AB (ref 70–99)
Glucose-Capillary: 173 mg/dL — ABNORMAL HIGH (ref 70–99)
Glucose-Capillary: 184 mg/dL — ABNORMAL HIGH (ref 70–99)

## 2014-02-15 LAB — URINE MICROSCOPIC-ADD ON

## 2014-02-15 LAB — MRSA PCR SCREENING: MRSA BY PCR: NEGATIVE

## 2014-02-15 MED ORDER — ATORVASTATIN CALCIUM 80 MG PO TABS
80.0000 mg | ORAL_TABLET | Freq: Every day | ORAL | Status: DC
  Filled 2014-02-15 (×2): qty 1

## 2014-02-15 MED ORDER — SODIUM CHLORIDE 0.9 % IV SOLN: INTRAVENOUS | Status: DC

## 2014-02-15 MED ORDER — INSULIN DETEMIR 100 UNIT/ML ~~LOC~~ SOLN
10.0000 [IU] | Freq: Every day | SUBCUTANEOUS | Status: DC
  Administered 2014-02-16: 10 [IU] via SUBCUTANEOUS
  Filled 2014-02-15 (×2): qty 0.1

## 2014-02-15 MED ORDER — FUROSEMIDE 20 MG PO TABS
20.0000 mg | ORAL_TABLET | Freq: Every day | ORAL | Status: DC
  Administered 2014-02-16 – 2014-02-19 (×4): 20 mg via ORAL
  Filled 2014-02-15 (×5): qty 1

## 2014-02-15 MED ORDER — DEXTROSE-NACL 5-0.45 % IV SOLN: INTRAVENOUS | Status: DC

## 2014-02-15 MED ORDER — PANTOPRAZOLE SODIUM 40 MG PO TBEC
40.0000 mg | DELAYED_RELEASE_TABLET | Freq: Every day | ORAL | Status: DC
  Administered 2014-02-16 – 2014-02-19 (×4): 40 mg via ORAL
  Filled 2014-02-15 (×4): qty 1

## 2014-02-15 MED ORDER — SODIUM CHLORIDE 0.9 % IV SOLN
INTRAVENOUS | Status: DC
  Administered 2014-02-15: 5.4 [IU]/h via INTRAVENOUS
  Filled 2014-02-15: qty 2.5

## 2014-02-15 MED ORDER — DEXTROSE 50 % IV SOLN: 25.0000 mL | Freq: Once | INTRAVENOUS | Status: AC | PRN

## 2014-02-15 MED ORDER — SODIUM CHLORIDE 0.9 % IJ SOLN: 3.0000 mL | INTRAMUSCULAR | Status: DC | PRN

## 2014-02-15 MED ORDER — ALBUTEROL SULFATE HFA 108 (90 BASE) MCG/ACT IN AERS: 1.0000 | INHALATION_SPRAY | Freq: Four times a day (QID) | RESPIRATORY_TRACT | Status: DC | PRN

## 2014-02-15 MED ORDER — SODIUM CHLORIDE 0.9 % IV SOLN: 1000.0000 mL | INTRAVENOUS | Status: DC

## 2014-02-15 MED ORDER — SODIUM CHLORIDE 0.9 % IV SOLN: 250.0000 mL | INTRAVENOUS | Status: DC | PRN

## 2014-02-15 MED ORDER — DEXTROSE-NACL 5-0.45 % IV SOLN
INTRAVENOUS | Status: DC
  Administered 2014-02-15: 16:00:00 via INTRAVENOUS

## 2014-02-15 MED ORDER — ISOSORBIDE MONONITRATE ER 30 MG PO TB24: 30.0000 mg | ORAL_TABLET | Freq: Every day | ORAL | Status: DC

## 2014-02-15 MED ORDER — ENOXAPARIN SODIUM 40 MG/0.4ML ~~LOC~~ SOLN
40.0000 mg | SUBCUTANEOUS | Status: DC
  Filled 2014-02-15: qty 0.4

## 2014-02-15 MED ORDER — LORAZEPAM 2 MG/ML IJ SOLN
0.5000 mg | Freq: Once | INTRAMUSCULAR | Status: AC
  Administered 2014-02-15: 0.5 mg via INTRAVENOUS
  Filled 2014-02-15: qty 1

## 2014-02-15 MED ORDER — SODIUM CHLORIDE 0.9 % IV SOLN
1000.0000 mL | INTRAVENOUS | Status: DC
  Administered 2014-02-15: 1000 mL via INTRAVENOUS

## 2014-02-15 MED ORDER — INSULIN ASPART 100 UNIT/ML ~~LOC~~ SOLN
0.0000 [IU] | Freq: Three times a day (TID) | SUBCUTANEOUS | Status: DC
  Administered 2014-02-16: 3 [IU] via SUBCUTANEOUS
  Administered 2014-02-16 (×2): 5 [IU] via SUBCUTANEOUS

## 2014-02-15 MED ORDER — TICAGRELOR 90 MG PO TABS
90.0000 mg | ORAL_TABLET | Freq: Two times a day (BID) | ORAL | Status: DC
  Administered 2014-02-15 – 2014-02-19 (×8): 90 mg via ORAL
  Filled 2014-02-15 (×10): qty 1

## 2014-02-15 MED ORDER — SODIUM CHLORIDE 0.9 % IV SOLN
INTRAVENOUS | Status: DC
  Filled 2014-02-15: qty 2.5

## 2014-02-15 MED ORDER — SODIUM CHLORIDE 0.9 % IJ SOLN
3.0000 mL | Freq: Two times a day (BID) | INTRAMUSCULAR | Status: DC
Start: 2014-02-15 — End: 2014-02-15

## 2014-02-15 MED ORDER — ASPIRIN EC 81 MG PO TBEC
81.0000 mg | DELAYED_RELEASE_TABLET | Freq: Every day | ORAL | Status: DC
  Administered 2014-02-16 – 2014-02-19 (×4): 81 mg via ORAL
  Filled 2014-02-15 (×5): qty 1

## 2014-02-15 MED ORDER — SODIUM CHLORIDE 0.9 % IV SOLN
1000.0000 mL | Freq: Once | INTRAVENOUS | Status: AC
Start: 2014-02-15 — End: 2014-02-15
  Administered 2014-02-15: 1000 mL via INTRAVENOUS

## 2014-02-15 MED ORDER — DEXTROSE 50 % IV SOLN: 25.0000 mL | INTRAVENOUS | Status: DC | PRN

## 2014-02-15 MED ORDER — ALBUTEROL SULFATE (2.5 MG/3ML) 0.083% IN NEBU: 2.5000 mg | INHALATION_SOLUTION | Freq: Four times a day (QID) | RESPIRATORY_TRACT | Status: DC | PRN

## 2014-02-15 MED ORDER — ISOSORBIDE MONONITRATE ER 30 MG PO TB24
30.0000 mg | ORAL_TABLET | Freq: Every day | ORAL | Status: DC
  Administered 2014-02-16 – 2014-02-19 (×4): 30 mg via ORAL
  Filled 2014-02-15 (×5): qty 1

## 2014-02-15 MED ORDER — DEXTROSE 50 % IV SOLN: 50.0000 mL | Freq: Once | INTRAVENOUS | Status: AC | PRN

## 2014-02-15 MED ORDER — INSULIN ASPART 100 UNIT/ML ~~LOC~~ SOLN
0.0000 [IU] | Freq: Every day | SUBCUTANEOUS | Status: DC
Start: 2014-02-15 — End: 2014-02-17
  Administered 2014-02-16 (×2): 3 [IU] via SUBCUTANEOUS

## 2014-02-15 MED ORDER — GLUCOSE 40 % PO GEL: 1.0000 | ORAL | Status: DC | PRN

## 2014-02-15 MED ORDER — CARVEDILOL 6.25 MG PO TABS
6.2500 mg | ORAL_TABLET | Freq: Two times a day (BID) | ORAL | Status: DC
  Administered 2014-02-16 – 2014-02-19 (×7): 6.25 mg via ORAL
  Filled 2014-02-15 (×9): qty 1

## 2014-02-15 MED ORDER — THYROID 120 MG PO TABS
120.0000 mg | ORAL_TABLET | Freq: Every day | ORAL | Status: DC
Start: 2014-02-16 — End: 2014-02-19
  Administered 2014-02-16 – 2014-02-19 (×4): 120 mg via ORAL
  Filled 2014-02-15 (×4): qty 1

## 2014-02-15 MED ORDER — ENOXAPARIN SODIUM 40 MG/0.4ML ~~LOC~~ SOLN
40.0000 mg | SUBCUTANEOUS | Status: DC
  Administered 2014-02-16 – 2014-02-17 (×2): 40 mg via SUBCUTANEOUS
  Filled 2014-02-15 (×2): qty 0.4

## 2014-02-15 MED ORDER — ASPIRIN 81 MG PO CHEW: 81.0000 mg | CHEWABLE_TABLET | ORAL | Status: DC

## 2014-02-15 MED ORDER — ALLOPURINOL 300 MG PO TABS
300.0000 mg | ORAL_TABLET | Freq: Every day | ORAL | Status: DC
  Administered 2014-02-16 – 2014-02-18 (×3): 300 mg via ORAL
  Filled 2014-02-15 (×4): qty 1

## 2014-02-15 NOTE — H&P (Signed)
History and Physical  Carol [name] [id number] DOB: 1936/05/05 DOA: 02/15/2014   PCP: Lillard Anes, MD   Chief Complaint: Polyuria  HPI:  78 year old female with diabetes mellitus type 2, hypertension, hyperlipidemia, ischemic cardiomyopathy with recent NSTEMI the first week of August 2015 presented with some shortness of breath and polyuria. The patient states that she had some jaw pain radiating down her neck on 02/10/2014. The patient called her cardiologist, Dr. Haroldine Laws, an appointment was scheduled for 02/15/2014. The patient presented to the office today and blood work was performed. She was scheduled for a Myoview stress test later today, but it was called to come to the emergency department when it was noted that her serum glucose was 783. The patient had been complaining of some dyspnea on exertion with intermittent jaw discomfort since 02/10/2014. She presently denies any jaw discomfort or chest discomfort or shortness of breath. She denies any fevers, chills, vomiting, diarrhea, abdominal pain, dysuria. Does have some polyuria. She denies any headaches or visual disturbance. She endorses compliance with all her medications, but he was noted during her 02/01/2014 visit with Dr. Haroldine Laws that the patient had not filled her glipizide and lisinopril. In the emergency department, serum glucose was 779 with anion gap of 18 and bicarbonate 18. The patient was given 1 L normal saline and started on normal saline at 125 cc per hour. CBC was unremarkable. Serum creatinine was 1.34. The patient was started on IV insulin. Lactic acid was 2.1. Urinalysis was negative for any pyuria or ketones. Initial serum troponin was negative. EKG shows sinus rhythm without ST changes.  Assessment/Plan: Hyperosmolar nonketotic state -Start IV insulin via glucose stabilize her protocol -Will have to give fluids judicious given the patient's ischemic myopathy and EF 25-30% -Transitioned to  subcutaneous insulin once anion gap closes Jaw pain -? Angina equivalent -Cycle troponins -Initial EKG is reassuring Ischemic cardiomyopathy -Patient had recent NSTEMI -Continue carvedilol, Imdur, Brillinta Diabetes mellitus type 2, uncontrolled -01/22/2014 hemoglobin A1c 9.1 -Patient endorses compliance with her medications -Patient will likely need to go home on insulin -Consult diabetic educator Chronic systolic CHF -Compensated -The patient was actually below her dry weight this am at CHF clinic--she weighed 178lbs -The patient weighed 185 pounds at the time of discharge on 01/25/2014 -Continue furosemide 20 mg daily -Judicious IV fluids Hyperlipidemia -Continue statin Hypothyroidism -Continue Armour Thyroid -01/22/2014 TSH 0.564 CKD stage III -Baseline creatinine 1.1-1.4 -Continue monitor       Past Medical History  Diagnosis Date  . Hypertension   . Asthma   . Hyperlipidemia   . Gout, unspecified   . CAD in native artery 01/22/2014  . S/P PTCA (percutaneous transluminal coronary angioplasty)01/22/14 01/22/2014    a LHC (12/2013): successful PTCA second obtuse marginal, contained 99% stenosis within a very tortuous segment, reduction from 99% to less than or equal to 50% with TIMI grade 3 flow, 80-90% stenosis in mid PDA, Irregularities less than 50% in mid RCA, pox LAD and prox LCx  . Chronic systolic heart failure     a. ICM b. ECHO (01/2014): EF 25-30%, grade I DD, trivial MR  . CHF (congestive heart failure)   . NSTEMI (non-ST elevated myocardial infarction) 2014; 12/2013  . Environmental allergies   . Pneumonia     "2-3 times" (01/25/2014)  . Hyperthyroidism   . Type II diabetes mellitus   . GERD (gastroesophageal reflux disease)   . Stroke 1990's    "mild", denies residual   Past Surgical  History  Procedure Laterality Date  . Shoulder open rotator cuff repair Bilateral 1980's - 1990's  . Total knee arthroplasty Right 2009  . Cesarean section  [id number]; 1960    . Abdominal hysterectomy    . Appendectomy  1959  . Cholecystectomy  1989  . Joint replacement    . Reduction mammaplasty Bilateral 1990's  . Cataract extraction w/ intraocular lens  implant, bilateral Bilateral 2013  . Coronary angioplasty  12/2013   Social History:  reports that she has never smoked. She has never used smokeless tobacco. She reports that she does not drink alcohol or use illicit drugs.   Family History  Problem Relation Age of Onset  . Hypertension Mother   . Diabetes Mother   . Heart attack Mother 58  . Heart disease Father   . Diabetes Brother   . Heart disease Brother      Allergies  Allergen Reactions  . Contrast Media [Iodinated Diagnostic Agents] Other (See Comments)    unknown  . Cortizone-10 [Hydrocortisone] Other (See Comments)    unknown  . Zocor [Simvastatin]     Muscle aches- severe      Prior to Admission medications   Medication Sig Start Date End Date Taking? Authorizing Provider  albuterol (PROVENTIL HFA;VENTOLIN HFA) 108 (90 BASE) MCG/ACT inhaler Inhale 1 puff into the lungs every 6 (six) hours as needed for wheezing or shortness of breath.   Yes Historical Provider, MD  allopurinol (ZYLOPRIM) 300 MG tablet Take 300 mg by mouth daily.   Yes Historical Provider, MD  aspirin EC 81 MG EC tablet Take 1 tablet (81 mg total) by mouth daily. 01/25/14  Yes Rande Brunt, NP  atorvastatin (LIPITOR) 80 MG tablet Take 1 tablet (80 mg total) by mouth daily at 6 PM. 01/25/14  Yes Rande Brunt, NP  Biotin 5000 MCG CAPS Take 1 capsule by mouth daily.   Yes Historical Provider, MD  Canagliflozin (INVOKANA) 300 MG TABS Take 1 tablet (300 mg total) by mouth daily. 02/02/14  Yes Belkys A Regalado, MD  carvedilol (COREG) 6.25 MG tablet Take 1 tablet (6.25 mg total) by mouth 2 (two) times daily with a meal. 01/25/14  Yes Rande Brunt, NP  colchicine 0.6 MG tablet Take 0.6 mg by mouth daily.    Yes Historical Provider, MD  glipiZIDE (GLUCOTROL) 5 MG tablet  Take 1 tablet (5 mg total) by mouth 2 (two) times daily before a meal. 02/02/14  Yes Belkys A Regalado, MD  isosorbide mononitrate (IMDUR) 30 MG 24 hr tablet Take 1 tablet (30 mg total) by mouth daily. 02/15/14  Yes Rande Brunt, NP  Lactobacillus (PROBIOTIC ACIDOPHILUS) TABS Take 1 tablet by mouth daily.    Yes Historical Provider, MD  nitroGLYCERIN (NITROSTAT) 0.4 MG SL tablet Place 0.4 mg under the tongue every 5 (five) minutes as needed for chest pain.   Yes Historical Provider, MD  omeprazole (PRILOSEC) 20 MG capsule Take 20 mg by mouth daily.   Yes Historical Provider, MD  thyroid (ARMOUR) 60 MG tablet Take 120 mg by mouth daily. Take two tabs once a day for 90 days (09/04/2012)   Yes Historical Provider, MD  ticagrelor (BRILINTA) 90 MG TABS tablet Take 1 tablet (90 mg total) by mouth 2 (two) times daily. 01/25/14  Yes Rande Brunt, NP    Review of Systems:  Constitutional:  No weight loss, night sweats, Head&Eyes: No headache.  No vision loss.  No eye pain or scotoma ENT:  No Difficulty swallowing,Tooth/dental problems,Sore throat,   Cardio-vascular:  No chest pain, Orthopnea, PND, swelling in lower extremities,  dizziness, palpitations  GI:  No  abdominal pain, nausea, vomiting, diarrhea, loss of appetite, hematochezia, melena, heartburn, indigestion, Resp:  No cough. No coughing up of blood .No wheezing.No chest wall deformity  Skin:  no rash or lesions.  GU:  no dysuria, change in color of urine, no urgency or frequency. No flank pain.  Musculoskeletal:  No joint pain or swelling. No decreased range of motion. No back pain.  Psych:  No change in mood or affect. No depression or anxiety. Neurologic: No headache, no dysesthesia, no focal weakness, no vision loss. No syncope  Physical Exam: Filed Vitals:   02/15/14 1227 02/15/14 1300 02/15/14 1315 02/15/14 1348  BP: 107/57 141/71 140/79 132/46  Pulse: 73 66 66 67  Temp:      TempSrc:      Resp: 17 14 17 17   SpO2: 98%  100% 100% 100%   General:  A&O x 3, NAD, nontoxic, pleasant/cooperative Head/Eye: No conjunctival hemorrhage, no icterus, [place]/AT, No nystagmus ENT:  No icterus,  No thrush, good dentition, no pharyngeal exudate Neck:  No masses, no lymphadenpathy, no bruits CV:  RRR, no rub, no gallop, no S3, no JVD Lung:  CTAB, good air movement, no wheeze, no rhonchi Abdomen: soft/NT, +BS, nondistended, no peritoneal signs Ext: No cyanosis, No rashes, No petechiae, No lymphangitis, trace LE edema Neuro: CNII-XII intact, strength 4/5 in bilateral upper and lower extremities, no dysmetria  Labs on Admission:  Basic Metabolic Panel:  Recent Labs Lab 02/15/14 0934 02/15/14 1212  NA 126* 125*  K 4.9 5.3  CL 90* 89*  CO2 18* 18*  GLUCOSE 783* 779*  BUN 56* 56*  CREATININE 1.40* 1.34*  CALCIUM 9.4 9.8   Liver Function Tests: No results found for this basename: AST, ALT, ALKPHOS, BILITOT, PROT, ALBUMIN,  in the last 168 hours No results found for this basename: LIPASE, AMYLASE,  in the last 168 hours No results found for this basename: AMMONIA,  in the last 168 hours CBC:  Recent Labs Lab 02/15/14 1212  WBC 5.5  NEUTROABS 3.3  HGB 12.1  HCT 37.2  MCV 84.5  PLT 206   Cardiac Enzymes:  Recent Labs Lab 02/15/14 0934 02/15/14 1212  TROPONINI <0.30 <0.30   BNP: No components found with this basename: POCBNP,  CBG:  Recent Labs Lab 02/15/14 1058 02/15/14 1238 02/15/14 1343  GLUCAP >600* >600* >600*    Radiological Exams on Admission: No results found.  EKG: Independently reviewed. Sinus rhythm, no ST-T wave changes    Time spent:60 minutes Code Status:   FULL Family Communication:   No Family at bedside   Davaun Quintela, DO  [facility] Pager [phone]  If 7PM-7AM, please contact night-coverage [email or web address] Password [email or web address] 02/15/2014, 2:10 PM

## 2014-02-15 NOTE — ED Provider Notes (Signed)
CSN: [id number]     Arrival date & time 02/15/14  1048 History   First MD Initiated Contact with Patient 02/15/14 1119     Chief Complaint  Patient presents with  . Hyperglycemia     (Consider location/radiation/quality/duration/timing/severity/associated sxs/prior Treatment) HPI Comments: Patient here complaining of polyuria and polydipsia times several weeks worse the last 24 hours. She had a recent MI a month ago and was to have a nuclear medicine test today. Patient had a bmet that showed a blood sugar of 729. She was sent here for further evaluation. Patient states that she has been compliant with her diet but she has had a recent change in her diabetic medications. She has had some recent weight loss. She denies any current chest pain or chest pressure. She is not short of breath. Symptoms persisted and nothing makes them better or worse. No treatment used prior to arrival.  Patient is a 78 y.o. female presenting with hyperglycemia. The history is provided by the patient.  Hyperglycemia   Past Medical History  Diagnosis Date  . Hypertension   . Asthma   . Hyperlipidemia   . Gout, unspecified   . CAD in native artery 01/22/2014  . S/P PTCA (percutaneous transluminal coronary angioplasty)01/22/14 01/22/2014    a LHC (12/2013): successful PTCA second obtuse marginal, contained 99% stenosis within a very tortuous segment, reduction from 99% to less than or equal to 50% with TIMI grade 3 flow, 80-90% stenosis in mid PDA, Irregularities less than 50% in mid RCA, pox LAD and prox LCx  . Chronic systolic heart failure     a. ICM b. ECHO (01/2014): EF 25-30%, grade I DD, trivial MR  . CHF (congestive heart failure)   . NSTEMI (non-ST elevated myocardial infarction) 2014; 12/2013  . Environmental allergies   . Pneumonia     "2-3 times" (01/25/2014)  . Hyperthyroidism   . Type II diabetes mellitus   . GERD (gastroesophageal reflux disease)   . Stroke 1990's    "mild", denies residual   Past  Surgical History  Procedure Laterality Date  . Shoulder open rotator cuff repair Bilateral 1980's - 1990's  . Total knee arthroplasty Right 2009  . Cesarean section  [id number]; 1960  . Abdominal hysterectomy    . Appendectomy  1959  . Cholecystectomy  1989  . Joint replacement    . Reduction mammaplasty Bilateral 1990's  . Cataract extraction w/ intraocular lens  implant, bilateral Bilateral 2013  . Coronary angioplasty  12/2013   Family History  Problem Relation Age of Onset  . Hypertension Mother   . Diabetes Mother   . Heart attack Mother 33  . Heart disease Father   . Diabetes Brother   . Heart disease Brother    History  Substance Use Topics  . Smoking status: Never Smoker   . Smokeless tobacco: Never Used  . Alcohol Use: No   OB History   Grav Para Term Preterm Abortions TAB SAB Ect Mult Living                 Review of Systems  All other systems reviewed and are negative.     Allergies  Contrast media; Cortizone-10; and Zocor  Home Medications   Prior to Admission medications   Medication Sig Start Date End Date Taking? Authorizing Provider  albuterol (PROVENTIL HFA;VENTOLIN HFA) 108 (90 BASE) MCG/ACT inhaler Inhale 1 puff into the lungs every 6 (six) hours as needed for wheezing or shortness of breath.  Yes Historical Provider, MD  allopurinol (ZYLOPRIM) 300 MG tablet Take 300 mg by mouth daily.   Yes Historical Provider, MD  aspirin EC 81 MG EC tablet Take 1 tablet (81 mg total) by mouth daily. 01/25/14  Yes Rande Brunt, NP  atorvastatin (LIPITOR) 80 MG tablet Take 1 tablet (80 mg total) by mouth daily at 6 PM. 01/25/14  Yes Rande Brunt, NP  Biotin 5000 MCG CAPS Take 1 capsule by mouth daily.   Yes Historical Provider, MD  Canagliflozin (INVOKANA) 300 MG TABS Take 1 tablet (300 mg total) by mouth daily. 02/02/14  Yes Belkys A Regalado, MD  carvedilol (COREG) 6.25 MG tablet Take 1 tablet (6.25 mg total) by mouth 2 (two) times daily with a meal. 01/25/14  Yes Rande Brunt, NP  colchicine 0.6 MG tablet Take 0.6 mg by mouth daily.    Yes Historical Provider, MD  glipiZIDE (GLUCOTROL) 5 MG tablet Take 1 tablet (5 mg total) by mouth 2 (two) times daily before a meal. 02/02/14  Yes Belkys A Regalado, MD  isosorbide mononitrate (IMDUR) 30 MG 24 hr tablet Take 1 tablet (30 mg total) by mouth daily. 02/15/14  Yes Rande Brunt, NP  Lactobacillus (PROBIOTIC ACIDOPHILUS) TABS Take 1 tablet by mouth daily.    Yes Historical Provider, MD  nitroGLYCERIN (NITROSTAT) 0.4 MG SL tablet Place 0.4 mg under the tongue every 5 (five) minutes as needed for chest pain.   Yes Historical Provider, MD  omeprazole (PRILOSEC) 20 MG capsule Take 20 mg by mouth daily.   Yes Historical Provider, MD  thyroid (ARMOUR) 60 MG tablet Take 120 mg by mouth daily. Take two tabs once a day for 90 days (09/04/2012)   Yes Historical Provider, MD  ticagrelor (BRILINTA) 90 MG TABS tablet Take 1 tablet (90 mg total) by mouth 2 (two) times daily. 01/25/14  Yes Rande Brunt, NP   BP 128/62  Pulse 76  Temp(Src) 98.1 F (36.7 C) (Oral)  Resp 20  SpO2 100% Physical Exam  Nursing note and vitals reviewed. Constitutional: She is oriented to person, place, and time. She appears well-developed and well-nourished.  Non-toxic appearance. No distress.  HENT:  Head: Normocephalic and atraumatic.  Eyes: Conjunctivae, EOM and lids are normal. Pupils are equal, round, and reactive to light.  Neck: Normal range of motion. Neck supple. No tracheal deviation present. No mass present.  Cardiovascular: Normal rate, regular rhythm and normal heart sounds.  Exam reveals no gallop.   No murmur heard. Pulmonary/Chest: Effort normal and breath sounds normal. No stridor. No respiratory distress. She has no decreased breath sounds. She has no wheezes. She has no rhonchi. She has no rales.  Abdominal: Soft. Normal appearance and bowel sounds are normal. She exhibits no distension. There is no tenderness. There is no  rebound and no CVA tenderness.  Musculoskeletal: Normal range of motion. She exhibits no edema and no tenderness.  Neurological: She is alert and oriented to person, place, and time. She has normal strength. No cranial nerve deficit or sensory deficit. GCS eye subscore is 4. GCS verbal subscore is 5. GCS motor subscore is 6.  Skin: Skin is warm and dry. No abrasion and no rash noted.  Psychiatric: She has a normal mood and affect. Her speech is normal and behavior is normal.    ED Course  Procedures (including critical care time) Labs Review Labs Reviewed  CBG MONITORING, ED - Abnormal; Notable for the following:    Glucose-Capillary >600 (*)  All other components within normal limits  CBC WITH DIFFERENTIAL  BASIC METABOLIC PANEL  URINALYSIS, ROUTINE W REFLEX MICROSCOPIC  BLOOD GAS, VENOUS    Imaging Review No results found.   EKG Interpretation   Date/Time:  Monday February 15 2014 12:06:04 EDT Ventricular Rate:  73 PR Interval:  239 QRS Duration: 97 QT Interval:  336 QTC Calculation: 370 R Axis:   54 Text Interpretation:  Sinus rhythm Prolonged PR interval No significant  change since last tracing Confirmed by Autumne Kallio  MD, Wacey Zieger ([id number]) on  02/15/2014 1:50:00 PM      MDM   Final diagnoses:  None   Patient given IV fluids and started on insulin for her hyperglycemia and will be admitted to the medicine service     Leota Jacobsen, MD 02/15/14 1350

## 2014-02-15 NOTE — ED Notes (Signed)
Lactic acid results given to dr. Zenia Resides.

## 2014-02-15 NOTE — ED Notes (Signed)
Lab called with critical glucose=779 mg/dl

## 2014-02-15 NOTE — Progress Notes (Signed)
Orthostatic Vital signs Laying: 140/72 Sitting: 120/70 Standing: 112/64  Patient felt dizzy but stable on feet while sitting and standing.  Renee Pain

## 2014-02-15 NOTE — ED Notes (Signed)
CBG is 152. Notified Nurse Izora Gala.

## 2014-02-15 NOTE — Patient Instructions (Signed)
Will start Imdur 30 mg daily.  Lexiscan Myoview at [facility] at 12:00 today  Will call about test results and labs.  Follow up in 3 weeks  Pager number: [phone]  Do the following things EVERYDAY: 1) Weigh yourself in the morning before breakfast. Write it down and keep it in a log. 2) Take your medicines as prescribed 3) Eat low salt foods-Limit salt (sodium) to 2000 mg per day.  4) Stay as active as you can everyday 5) Limit all fluids for the day to less than 2 liters 6)

## 2014-02-15 NOTE — ED Notes (Signed)
CBG Exceeds Measure. Notified Nurse Izora Gala.

## 2014-02-15 NOTE — ED Notes (Signed)
Pt c/o hyperglycemia x several days; pt recently admitted for MI; pt sts feeling generally weak

## 2014-02-15 NOTE — ED Notes (Signed)
Dr Tat at bedside 

## 2014-02-15 NOTE — Progress Notes (Signed)
Patient ID: Carol [name], female   DOB: 02/01/1936, 78 y.o.   MRN: [id number] PCP: Lillard Anes Primary Cardiologist: Dr. Angelena Form  HPI:  [name] is a 78 yo female with a history of DM, HTN, asthma, hyperthyroidism, ICM, chronic systolic heart failure, nonobstructive CAD s/p PTCA of OM2 and HLD.  Presented to the ED by EMS with CP (01/22/14). She reported that she had been having CP for 2 months that was progressive. Of note her last Nuc study was 12/2012 which was normal with EF 75% and she had an Echo 11/2013 showing an EF 55-60% with no wall motion abnormalities. During stay underwent LHC with PTCA of OM2. Repeat ECHO (01/2014): EF 25-30%.   Cath 8/15  LM ok  LAD 50% prox  LCX 40-50% prox OM-2 99% -> PTCA OM2 RCA PDA 90% (medical mgmt)  LVEDP 36  Follow up for Heart Failure: Last visit lasix stopped d/t concern for possible over-diuresis. Called last week with reports of SOB and L jaw pain and since she has not felt well, very fatigued. She was told to take lasix 40 mg. Reports every once in awhile still has aches around gumline and sometimes feels like she has a 20 lb rock on her chest and notices pain more with ambulation. + dizziness with position changes. Not able to walk very far d/t SOB. Denies edema. Weight at home 171 lbs and SBP 120s. Following a low salt diet and drinking more than 2L a day. Blood sugars still running in the 300s.   Labs (01/25/14): Creatinine 1.68, BUN 42 (02/01/14) Creatinine 1.24, BUN 43  ROS: All systems negative except as listed in HPI, PMH and Problem List.  SH:  History   Social History  . Marital Status: Widowed    Spouse Name: N/A    Number of Children: 2  . Years of Education: N/A   Occupational History  . Retired    Social History Main Topics  . Smoking status: Never Smoker   . Smokeless tobacco: Never Used  . Alcohol Use: No  . Drug Use: No  . Sexual Activity: No   Other Topics Concern  . Not on file   Social History  Narrative   Lives in [place] by herself.     FH:  Family History  Problem Relation Age of Onset  . Hypertension Mother   . Diabetes Mother   . Heart attack Mother 39  . Heart disease Father   . Diabetes Brother   . Heart disease Brother     Past Medical History  Diagnosis Date  . Hypertension   . Asthma   . Hyperlipidemia   . Gout, unspecified   . CAD in native artery 01/22/2014  . S/P PTCA (percutaneous transluminal coronary angioplasty)01/22/14 01/22/2014    a LHC (12/2013): successful PTCA second obtuse marginal, contained 99% stenosis within a very tortuous segment, reduction from 99% to less than or equal to 50% with TIMI grade 3 flow, 80-90% stenosis in mid PDA, Irregularities less than 50% in mid RCA, pox LAD and prox LCx  . Chronic systolic heart failure     a. ICM b. ECHO (01/2014): EF 25-30%, grade I DD, trivial MR  . CHF (congestive heart failure)   . NSTEMI (non-ST elevated myocardial infarction) 2014; 12/2013  . Environmental allergies   . Pneumonia     "2-3 times" (01/25/2014)  . Hyperthyroidism   . Type II diabetes mellitus   . GERD (gastroesophageal reflux disease)   . Stroke  1990's    "mild", denies residual    Current Outpatient Prescriptions  Medication Sig Dispense Refill  . albuterol (PROVENTIL HFA;VENTOLIN HFA) 108 (90 BASE) MCG/ACT inhaler Inhale 1 puff into the lungs every 6 (six) hours as needed for wheezing or shortness of breath.      . allopurinol (ZYLOPRIM) 300 MG tablet Take 300 mg by mouth daily.      Marland Kitchen aspirin EC 81 MG EC tablet Take 1 tablet (81 mg total) by mouth daily.  30 tablet  3  . atorvastatin (LIPITOR) 80 MG tablet Take 1 tablet (80 mg total) by mouth daily at 6 PM.  30 tablet  3  . Biotin 5000 MCG CAPS Take 1 capsule by mouth daily.      . Canagliflozin (INVOKANA) 300 MG TABS Take 1 tablet (300 mg total) by mouth daily.  30 tablet  0  . carvedilol (COREG) 6.25 MG tablet Take 1 tablet (6.25 mg total) by mouth 2 (two) times daily with a  meal.  60 tablet  3  . colchicine 0.6 MG tablet Take 0.6 mg by mouth daily.       Marland Kitchen glipiZIDE (GLUCOTROL) 5 MG tablet Take 1 tablet (5 mg total) by mouth 2 (two) times daily before a meal.  60 tablet  0  . Lactobacillus (PROBIOTIC ACIDOPHILUS) TABS Take 1 tablet by mouth daily.       . nitroGLYCERIN (NITROSTAT) 0.4 MG SL tablet Place 0.4 mg under the tongue every 5 (five) minutes as needed for chest pain.      Marland Kitchen omeprazole (PRILOSEC) 20 MG capsule Take 20 mg by mouth daily.      Marland Kitchen thyroid (ARMOUR) 60 MG tablet Take 120 mg by mouth daily. Take two tabs once a day for 90 days (09/04/2012)      . ticagrelor (BRILINTA) 90 MG TABS tablet Take 1 tablet (90 mg total) by mouth 2 (two) times daily.  60 tablet  3   No current facility-administered medications for this encounter.    Filed Vitals:   02/15/14 0857  BP: 150/70  Pulse: 88  Resp: 20  Weight: 178 lb (80.74 kg)  SpO2: 100%    PHYSICAL EXAM:  General:  Elderly, fatigued appearing. No resp difficulty, daughters present. HEENT: normal Neck: supple. JVP flat. Carotids 2+ bilaterally; no bruits. No lymphadenopathy or thryomegaly appreciated. Cor: PMI normal. Regular rate & rhythm. No rubs, gallops or murmurs. Lungs: clear Abdomen: soft, nontender, nondistended. No hepatosplenomegaly. No bruits or masses. Good bowel sounds. Extremities: no cyanosis, clubbing, rash, edema Neuro: alert & orientedx3, cranial nerves grossly intact. Moves all 4 extremities w/o difficulty. Affect pleasant.  EKG: 79 BPM with anterolateral T wave inversions  ASSESSMENT & PLAN:  1) Chronic systolic Heart Failure: ICM, EF 25-30% (01/2014) - NYHA III symptoms and volume status stable. Will continue lasix PRN only for weight greater than 185 lbs. She appears dry today and encouraged to drink extra fluids. Check BMET - Continue coreg 6.25 mg BID, will not titrate with orthostatic hypotension and may need to cut back to 3.125 mg BID. - Not currently on ACE-I with  orthostatic hypotension and will continue to hold for now. - Will eventually need cMRI to assess EF and anterior wall for scar but will order next visit.  - Reinforced the need and importance of daily weights, a low sodium diet, and fluid restriction (less than 2 L a day). Instructed to call the HF clinic if weight increases more than 3 lbs overnight  or 5 lbs in a week.  2) Non-obstructive CAD: s/p PTCA OM2 - Patient is having ischemic like pain with ambulation. Will order [employer] today and discussed the risks/benefits with patient and family. - No bleeding issues. Continue ASA, statin, Brilinta and BB. She will need to remain on Brilinta for at least a month (02/23/14) 3) CP - Ischemic in nature. She just had a PTCA of OM2 in 01/22/14. As above will order [facility] today and reassess. Get troponin today.  - Start Imdur 30 mg daily.  4) HTN - Patient is having orthostatic hypotension. Will not titrate anything at this time and may need to cut back on medications. 5) DM2 - Blood sugars running in the 300s at home. Encouraged to follow up with PCP  F/U 3 weeks.  Junie Bame B NP-C 10:35 AM

## 2014-02-15 NOTE — ED Notes (Signed)
I gave the patient a cup of ice water. 

## 2014-02-15 NOTE — ED Notes (Signed)
Checked patients blood sugar it was greater then 600

## 2014-02-15 NOTE — Telephone Encounter (Signed)
Critical lab glucose 783 reported to patient, currently in ED with daughters.  Daughter told ED nurse while on the phone. Carol [name]

## 2014-02-15 NOTE — ED Notes (Signed)
Pt had second episode of leg cramping; advised Dr. Zenia Resides; verbal order given to order/administer 0.5 mg of Ativan

## 2014-02-16 ENCOUNTER — Encounter (HOSPITAL_COMMUNITY): Payer: Self-pay | Admitting: *Deleted

## 2014-02-16 DIAGNOSIS — R6884 Jaw pain: Secondary | ICD-10-CM

## 2014-02-16 DIAGNOSIS — I209 Angina pectoris, unspecified: Secondary | ICD-10-CM

## 2014-02-16 DIAGNOSIS — I509 Heart failure, unspecified: Secondary | ICD-10-CM

## 2014-02-16 DIAGNOSIS — I10 Essential (primary) hypertension: Secondary | ICD-10-CM

## 2014-02-16 LAB — GLUCOSE, CAPILLARY
GLUCOSE-CAPILLARY: 208 mg/dL — AB (ref 70–99)
GLUCOSE-CAPILLARY: 277 mg/dL — AB (ref 70–99)
Glucose-Capillary: 276 mg/dL — ABNORMAL HIGH (ref 70–99)
Glucose-Capillary: 278 mg/dL — ABNORMAL HIGH (ref 70–99)
Glucose-Capillary: 258 mg/dL — ABNORMAL HIGH (ref 70–99)
Glucose-Capillary: 206 mg/dL — ABNORMAL HIGH (ref 70–99)
Glucose-Capillary: 295 mg/dL — ABNORMAL HIGH (ref 70–99)

## 2014-02-16 LAB — BASIC METABOLIC PANEL
Anion gap: 13 (ref 5–15)
BUN: 45 mg/dL — AB (ref 6–23)
CALCIUM: 9.4 mg/dL (ref 8.4–10.5)
CO2: 19 mEq/L (ref 19–32)
Chloride: 102 mEq/L (ref 96–112)
Creatinine, Ser: 1.13 mg/dL — ABNORMAL HIGH (ref 0.50–1.10)
GFR calc Af Amer: 54 mL/min — ABNORMAL LOW (ref >90)
GFR, EST NON AFRICAN AMERICAN: 46 mL/min — AB (ref >90)
Glucose, Bld: 348 mg/dL — ABNORMAL HIGH (ref 70–99)
POTASSIUM: 4.5 meq/L (ref 3.7–5.3)
SODIUM: 134 meq/L — AB (ref 137–147)

## 2014-02-16 LAB — TROPONIN I

## 2014-02-16 MED ORDER — INSULIN ASPART 100 UNIT/ML ~~LOC~~ SOLN
3.0000 [IU] | Freq: Three times a day (TID) | SUBCUTANEOUS | Status: DC
  Administered 2014-02-16 (×2): 3 [IU] via SUBCUTANEOUS

## 2014-02-16 MED ORDER — INSULIN DETEMIR 100 UNIT/ML ~~LOC~~ SOLN
20.0000 [IU] | Freq: Every day | SUBCUTANEOUS | Status: DC
  Administered 2014-02-16: 20 [IU] via SUBCUTANEOUS
  Filled 2014-02-16: qty 0.2

## 2014-02-16 MED ORDER — ROSUVASTATIN CALCIUM 20 MG PO TABS
20.0000 mg | ORAL_TABLET | Freq: Every day | ORAL | Status: DC
  Administered 2014-02-16 – 2014-02-17 (×2): 20 mg via ORAL
  Filled 2014-02-16 (×4): qty 1

## 2014-02-16 MED ORDER — NON FORMULARY: 20.0000 mg | Freq: Every day | Status: DC

## 2014-02-16 MED ORDER — INSULIN STARTER KIT- PEN NEEDLES (ENGLISH)
1.0000 | Freq: Once | Status: DC
  Filled 2014-02-16 (×2): qty 1

## 2014-02-16 NOTE — Progress Notes (Signed)
Pt. Received Levemir 10 units per order at midnight; continuing the insulin drip and CBG checks for the next two hour.

## 2014-02-16 NOTE — Progress Notes (Addendum)
PROGRESS NOTE    Carol [name] [id number] DOB: [birth year] DOA: 02/15/2014 PCP: Lillard Anes, MD  HPI/Brief narrative 78 year old female with history of DM 2, HTN, HLD, ischemic cardiomyopathy with recent NSTEMI in the first week of August 2015, experienced some jaw pain on 02/10/14 and went to the cardiologist's office on 02/15/14 for Myoview stress test and lab work. However her blood glucose was 783, she was complaining of DOE and intermittent jaw discomfort. She claimed compliance to her medications including diabetic medications. She however stopped taking Lipitor secondary to severe muscle pains in her legs with difficulty ambulating. She was sent to the hospital for admission. In the ED, blood glucose 779, bicarbonate 18, anion gap 18 and serum creatinine 1.34. She was admitted to step down on an insulin drip which has since been transitioned to maintenance dose insulins and SSI. Cardiology consulted on 8/25 for evaluation of jaw pain/ischemia.   Assessment/Plan:  1. Hyperosmolar nonketotic state secondary to poorly controlled DM 2: Admitted to step down. Treated with IV insulin, IV fluids and transitioned to Levemir and SSI. Last A1c on 7/31:9.1. Will increase Levemir to 20 units each bedtime, continue SSI and add mealtime NovoLog 3 units 3 times a day. These will have to be further titrated as needed. Recommend DC on insulins at least for a short period to achieve better control before attempting to transition back to by mouth medications. Will consult diabetes coordinator. 2. Jaw pain/? Angina equivalent/recent NSTEMI/Isch CM/nonobstructive CAD s/p PTCA of OM2: Patient denies any jaw pain at this time. Troponins x4 negative. Cardiology consulted for further management- ? in patient stress test. Echo 01/2014: EF 25-30%. Continue carvedilol, aspirin, Brilinta & Imdur. Intolerant of statins > defer to cards re alternate agent. 3. Chronic systolic CHF: Compensated. Continue Lasix 20  mg daily. 4. Hyperlipidemia: Intolerant of Zocor in past and Lipitor now-lower extremity muscle aches. DC Lipitor. May consider alternate group > defer to cards. 5. Hypothyroid: Continue Armour Thyroid. TSH 0.564 6. Stage III chronic kidney disease. Baseline creatinine 1.1-1.3. Stable. 7. Hypertension: Reasonably controlled. As per cardiology office notes, issues with orthostatic as outpatient. Monitor closely and if still has issues with same, may need to cut back on medications.  Code Status: Full Family Communication: Discussed with patient's daughter at bedside. Disposition Plan: Remains in step down unit for additional 24 hours.   Consultants:  Cardiology  Procedures:  None  Antibiotics:  None   Subjective: Denies jaw pain or leg cramps. No chest pain or dyspnea. Denies dysuria, fever or chills.  Objective: Filed Vitals:   02/16/14 0742 02/16/14 0805 02/16/14 0815 02/16/14 0900  BP: 143/68 135/80 135/51 119/70  Pulse: 71 70 69 76  Temp: 97.9 F (36.6 C)     TempSrc: Oral     Resp: 18 14 13 20   Height:      Weight:      SpO2: 100% 99% 100% 100%    Intake/Output Summary (Last 24 hours) at 02/16/14 1104 Last data filed at 02/16/14 0800  Gross per 24 hour  Intake    480 ml  Output      0 ml  Net    480 ml   Filed Weights   02/15/14 2100  Weight: 83.6 kg (184 lb 4.9 oz)     Exam:  General exam: Pleasant elderly female sitting up comfortably in bed. Respiratory system: Clear. No increased work of breathing. Cardiovascular system: S1 & S2 heard, RRR. No JVD, murmurs, gallops, clicks or pedal edema.  Gastrointestinal system: Abdomen is nondistended, soft and nontender. Normal bowel sounds heard. Central nervous system: Alert and oriented. No focal neurological deficits. Extremities: Symmetric 5 x 5 power.   Data Reviewed: Basic Metabolic Panel:  Recent Labs Lab 02/15/14 0934 02/15/14 1212 02/15/14 1759 02/15/14 2150 02/16/14 0418  NA 126* 125* 136*  138 134*  K 4.9 5.3 4.0 4.5 4.5  CL 90* 89* 100 102 102  CO2 18* 18* 20 21 19   GLUCOSE 783* 779* 135* 197* 348*  BUN 56* 56* 49* 46* 45*  CREATININE 1.40* 1.34* 1.19* 1.16* 1.13*  CALCIUM 9.4 9.8 9.7 9.6 9.4   Liver Function Tests: No results found for this basename: AST, ALT, ALKPHOS, BILITOT, PROT, ALBUMIN,  in the last 168 hours No results found for this basename: LIPASE, AMYLASE,  in the last 168 hours No results found for this basename: AMMONIA,  in the last 168 hours CBC:  Recent Labs Lab 02/15/14 1212  WBC 5.5  NEUTROABS 3.3  HGB 12.1  HCT 37.2  MCV 84.5  PLT 206   Cardiac Enzymes:  Recent Labs Lab 02/15/14 0934 02/15/14 1212 02/15/14 1800 02/16/14  TROPONINI <0.30 <0.30 <0.30 <0.30   BNP (last 3 results)  Recent Labs  01/22/14 1104  PROBNP 202.7   CBG:  Recent Labs Lab 02/15/14 2259 02/16/14 0017 02/16/14 0124 02/16/14 0217 02/16/14 0744  GLUCAP 190* 208* 276* 278* 258*    Recent Results (from the past 240 hour(s))  MRSA PCR SCREENING     Status: None   Collection Time    02/15/14  8:15 PM      Result Value Ref Range Status   MRSA by PCR NEGATIVE  NEGATIVE Final   Comment:            The GeneXpert MRSA Assay (FDA     approved for NASAL specimens     only), is one component of a     comprehensive MRSA colonization     surveillance program. It is not     intended to diagnose MRSA     infection nor to guide or     monitor treatment for     MRSA infections.       Studies: No results found.      Scheduled Meds: . allopurinol  300 mg Oral Daily  . aspirin EC  81 mg Oral Daily  . atorvastatin  80 mg Oral q1800  . carvedilol  6.25 mg Oral BID WC  . enoxaparin (LOVENOX) injection  40 mg Subcutaneous Q24H  . furosemide  20 mg Oral Daily  . insulin aspart  0-5 Units Subcutaneous QHS  . insulin aspart  0-9 Units Subcutaneous TID WC  . insulin detemir  10 Units Subcutaneous QHS  . isosorbide mononitrate  30 mg Oral Daily  .  pantoprazole  40 mg Oral Daily  . thyroid  120 mg Oral Daily  . ticagrelor  90 mg Oral BID   Continuous Infusions: . sodium chloride      Active Problems:   Unspecified hypothyroidism   Hyperlipidemia   Hyperosmolar non-ketotic state in patient with type 2 diabetes mellitus   Ischemic cardiomyopathy   Chronic systolic CHF (congestive heart failure)   CKD stage 3 due to type 2 diabetes mellitus    Time spent: 45 minutes.    Vernell Leep, MD, FACP, FHM. [facility] Pager [phone]  If 7PM-7AM, please contact night-coverage [email or web address] Password [email or web address] 02/16/2014, 11:04 AM    LOS: 1 day

## 2014-02-16 NOTE — Consult Note (Signed)
Reason for Consult: Chest pain Referring Physician:   [name] is an 78 y.o. female.  HPI:   The patient is a 78 yo female with a history of HTN, CAD, HLD, ISCM with EF of 25-30%(01/23/14), DM2, hyperthyroidism .   LHC (12/2013): successful PTCA second obtuse marginal, contained 99% stenosis within a very tortuous segment, reduction from 99% to less than or equal to 50% with TIMI grade 3 flow, 80-90% stenosis in mid PDA, Irregularities less than 50% in mid RCA, pox LAD and prox LCx.    The patient was seen at the office yesterday for recurrent CP and lab work revealed a glucose of 783.   A lexiscan cardiolite was ordered also.  Patient reports that last Wednesday she developed left jaw pain into her neck and across her shoulders. She has had problems with the shoulder issues in the past she thinks the neck issue may be related to her pillow. She also reports 8/10 chest pressure with diaphoresis, shortness of breath, nausea.  He did not take any nitroglycerin. She has not had similar symptoms since. Chest reports since starting Lipitor she's had muscle aches in her legs. She had similar symptom when she took Zocor for several years ago.  Past Medical History  Diagnosis Date  . Hypertension   . Asthma   . Hyperlipidemia   . Gout, unspecified   . CAD in native artery 01/22/2014  . S/P PTCA (percutaneous transluminal coronary angioplasty)01/22/14 01/22/2014    a LHC (12/2013): successful PTCA second obtuse marginal, contained 99% stenosis within a very tortuous segment, reduction from 99% to less than or equal to 50% with TIMI grade 3 flow, 80-90% stenosis in mid PDA, Irregularities less than 50% in mid RCA, pox LAD and prox LCx  . Chronic systolic heart failure     a. ICM b. ECHO (01/2014): EF 25-30%, grade I DD, trivial MR  . CHF (congestive heart failure)   . NSTEMI (non-ST elevated myocardial infarction) 2014; 12/2013  . Environmental allergies   . Pneumonia     "2-3 times" (01/25/2014)  .  Hyperthyroidism   . Type II diabetes mellitus   . GERD (gastroesophageal reflux disease)   . Stroke 1990's    "mild", denies residual    Past Surgical History  Procedure Laterality Date  . Shoulder open rotator cuff repair Bilateral 1980's - 1990's  . Total knee arthroplasty Right 2009  . Cesarean section  7628; 1960  . Abdominal hysterectomy    . Appendectomy  1959  . Cholecystectomy  1989  . Joint replacement    . Reduction mammaplasty Bilateral 1990's  . Cataract extraction w/ intraocular lens  implant, bilateral Bilateral 2013  . Coronary angioplasty  12/2013    Family History  Problem Relation Age of Onset  . Hypertension Mother   . Diabetes Mother   . Heart attack Mother 66  . Heart disease Father   . Diabetes Brother   . Heart disease Brother     Social History:  reports that she has never smoked. She has never used smokeless tobacco. She reports that she does not drink alcohol or use illicit drugs.  Allergies:  Allergies  Allergen Reactions  . Contrast Media [Iodinated Diagnostic Agents] Other (See Comments)    unknown  . Cortizone-10 [Hydrocortisone] Other (See Comments)    unknown  . Lipitor [Atorvastatin]     Muscle aches- severe  . Zocor [Simvastatin]     Muscle aches- severe    Medications:  Results for orders placed during the hospital encounter of 02/15/14 (from the past 48 hour(s))  CBG MONITORING, ED     Status: Abnormal   Collection Time    02/15/14 10:58 AM      Result Value Ref Range   Glucose-Capillary >600 (*) 70 - 99 mg/dL  URINALYSIS, ROUTINE W REFLEX MICROSCOPIC     Status: Abnormal   Collection Time    02/15/14 11:56 AM      Result Value Ref Range   Color, Urine YELLOW  YELLOW   APPearance CLEAR  CLEAR   Specific Gravity, Urine 1.020  1.005 - 1.030   pH 5.0  5.0 - 8.0   Glucose, UA >1000 (*) NEGATIVE mg/dL   Hgb urine dipstick NEGATIVE  NEGATIVE   Bilirubin Urine NEGATIVE  NEGATIVE   Ketones, ur NEGATIVE  NEGATIVE mg/dL    Protein, ur NEGATIVE  NEGATIVE mg/dL   Urobilinogen, UA 0.2  0.0 - 1.0 mg/dL   Nitrite NEGATIVE  NEGATIVE   Leukocytes, UA NEGATIVE  NEGATIVE  URINE MICROSCOPIC-ADD ON     Status: Abnormal   Collection Time    02/15/14 11:56 AM      Result Value Ref Range   WBC, UA 3-6  <3 WBC/hpf   Bacteria, UA FEW (*) RARE  CBC WITH DIFFERENTIAL     Status: None   Collection Time    02/15/14 12:12 PM      Result Value Ref Range   WBC 5.5  4.0 - 10.5 K/uL   RBC 4.40  3.87 - 5.11 MIL/uL   Hemoglobin 12.1  12.0 - 15.0 g/dL   HCT 37.2  36.0 - 46.0 %   MCV 84.5  78.0 - 100.0 fL   MCH 27.5  26.0 - 34.0 pg   MCHC 32.5  30.0 - 36.0 g/dL   RDW 15.5  11.5 - 15.5 %   Platelets 206  150 - 400 K/uL   Neutrophils Relative % 60  43 - 77 %   Neutro Abs 3.3  1.7 - 7.7 K/uL   Lymphocytes Relative 30  12 - 46 %   Lymphs Abs 1.7  0.7 - 4.0 K/uL   Monocytes Relative 10  3 - 12 %   Monocytes Absolute 0.5  0.1 - 1.0 K/uL   Eosinophils Relative 1  0 - 5 %   Eosinophils Absolute 0.0  0.0 - 0.7 K/uL   Basophils Relative 0  0 - 1 %   Basophils Absolute 0.0  0.0 - 0.1 K/uL  BASIC METABOLIC PANEL     Status: Abnormal   Collection Time    02/15/14 12:12 PM      Result Value Ref Range   Sodium 125 (*) 137 - 147 mEq/L   Potassium 5.3  3.7 - 5.3 mEq/L   Chloride 89 (*) 96 - 112 mEq/L   CO2 18 (*) 19 - 32 mEq/L   Glucose, Bld 779 (*) 70 - 99 mg/dL   Comment: CRITICAL RESULT CALLED TO, READ BACK BY AND VERIFIED WITH:     T.MITCHELL,RN [id number] 02/15/14 CLARK,S   BUN 56 (*) 6 - 23 mg/dL   Creatinine, Ser 1.34 (*) 0.50 - 1.10 mg/dL   Calcium 9.8  8.4 - 10.5 mg/dL   GFR calc non Af Amer 38 (*) >90 mL/min   GFR calc Af Amer 44 (*) >90 mL/min   Comment: (NOTE)     The eGFR has been calculated using the CKD EPI equation.  This calculation has not been validated in all clinical situations.     eGFR's persistently <90 mL/min signify possible Chronic Kidney     Disease.   Anion gap 18 (*) 5 - 15  TROPONIN I     Status:  None   Collection Time    02/15/14 12:12 PM      Result Value Ref Range   Troponin I <0.30  <0.30 ng/mL   Comment:            Due to the release kinetics of cTnI,     a negative result within the first hours     of the onset of symptoms does not rule out     myocardial infarction with certainty.     If myocardial infarction is still suspected,     repeat the test at appropriate intervals.  I-STAT CG4 LACTIC ACID, ED     Status: None   Collection Time    02/15/14 12:27 PM      Result Value Ref Range   Lactic Acid, Venous 2.18  0.5 - 2.2 mmol/L  CBG MONITORING, ED     Status: Abnormal   Collection Time    02/15/14 12:38 PM      Result Value Ref Range   Glucose-Capillary >600 (*) 70 - 99 mg/dL   Comment 1 Documented in Chart     Comment 2 Notify RN    CBG MONITORING, ED     Status: Abnormal   Collection Time    02/15/14  1:43 PM      Result Value Ref Range   Glucose-Capillary >600 (*) 70 - 99 mg/dL  CBG MONITORING, ED     Status: Abnormal   Collection Time    02/15/14  2:47 PM      Result Value Ref Range   Glucose-Capillary 409 (*) 70 - 99 mg/dL  CBG MONITORING, ED     Status: Abnormal   Collection Time    02/15/14  3:51 PM      Result Value Ref Range   Glucose-Capillary 238 (*) 70 - 99 mg/dL  CBG MONITORING, ED     Status: Abnormal   Collection Time    02/15/14  5:02 PM      Result Value Ref Range   Glucose-Capillary 120 (*) 70 - 99 mg/dL  BASIC METABOLIC PANEL     Status: Abnormal   Collection Time    02/15/14  5:59 PM      Result Value Ref Range   Sodium 136 (*) 137 - 147 mEq/L   Potassium 4.0  3.7 - 5.3 mEq/L   Chloride 100  96 - 112 mEq/L   CO2 20  19 - 32 mEq/L   Glucose, Bld 135 (*) 70 - 99 mg/dL   BUN 49 (*) 6 - 23 mg/dL   Creatinine, Ser 1.19 (*) 0.50 - 1.10 mg/dL   Calcium 9.7  8.4 - 10.5 mg/dL   GFR calc non Af Amer 44 (*) >90 mL/min   GFR calc Af Amer 50 (*) >90 mL/min   Comment: (NOTE)     The eGFR has been calculated using the CKD EPI equation.      This calculation has not been validated in all clinical situations.     eGFR's persistently <90 mL/min signify possible Chronic Kidney     Disease.   Anion gap 16 (*) 5 - 15  TROPONIN I     Status: None   Collection Time    02/15/14  6:00 PM      Result Value Ref Range   Troponin I <0.30  <0.30 ng/mL   Comment:            Due to the release kinetics of cTnI,     a negative result within the first hours     of the onset of symptoms does not rule out     myocardial infarction with certainty.     If myocardial infarction is still suspected,     repeat the test at appropriate intervals.  CBG MONITORING, ED     Status: Abnormal   Collection Time    02/15/14  6:08 PM      Result Value Ref Range   Glucose-Capillary 152 (*) 70 - 99 mg/dL   Comment 1 Documented in Chart     Comment 2 Notify RN    CBG MONITORING, ED     Status: Abnormal   Collection Time    02/15/14  7:19 PM      Result Value Ref Range   Glucose-Capillary 132 (*) 70 - 99 mg/dL   Comment 1 Notify RN     Comment 2 Documented in Chart    MRSA PCR SCREENING     Status: None   Collection Time    02/15/14  8:15 PM      Result Value Ref Range   MRSA by PCR NEGATIVE  NEGATIVE   Comment:            The GeneXpert MRSA Assay (FDA     approved for NASAL specimens     only), is one component of a     comprehensive MRSA colonization     surveillance program. It is not     intended to diagnose MRSA     infection nor to guide or     monitor treatment for     MRSA infections.  GLUCOSE, CAPILLARY     Status: Abnormal   Collection Time    02/15/14  8:25 PM      Result Value Ref Range   Glucose-Capillary 173 (*) 70 - 99 mg/dL  BASIC METABOLIC PANEL     Status: Abnormal   Collection Time    02/15/14  9:50 PM      Result Value Ref Range   Sodium 138  137 - 147 mEq/L   Potassium 4.5  3.7 - 5.3 mEq/L   Chloride 102  96 - 112 mEq/L   CO2 21  19 - 32 mEq/L   Glucose, Bld 197 (*) 70 - 99 mg/dL   BUN 46 (*) 6 - 23 mg/dL    Creatinine, Ser 1.16 (*) 0.50 - 1.10 mg/dL   Calcium 9.6  8.4 - 10.5 mg/dL   GFR calc non Af Amer 45 (*) >90 mL/min   GFR calc Af Amer 52 (*) >90 mL/min   Comment: (NOTE)     The eGFR has been calculated using the CKD EPI equation.     This calculation has not been validated in all clinical situations.     eGFR's persistently <90 mL/min signify possible Chronic Kidney     Disease.   Anion gap 15  5 - 15  GLUCOSE, CAPILLARY     Status: Abnormal   Collection Time    02/15/14  9:53 PM      Result Value Ref Range   Glucose-Capillary 184 (*) 70 - 99 mg/dL  GLUCOSE, CAPILLARY     Status: Abnormal   Collection Time  02/15/14 10:59 PM      Result Value Ref Range   Glucose-Capillary 190 (*) 70 - 99 mg/dL  TROPONIN I     Status: None   Collection Time    02/16/14 12:00 AM      Result Value Ref Range   Troponin I <0.30  <0.30 ng/mL   Comment:            Due to the release kinetics of cTnI,     a negative result within the first hours     of the onset of symptoms does not rule out     myocardial infarction with certainty.     If myocardial infarction is still suspected,     repeat the test at appropriate intervals.  GLUCOSE, CAPILLARY     Status: Abnormal   Collection Time    02/16/14 12:17 AM      Result Value Ref Range   Glucose-Capillary 208 (*) 70 - 99 mg/dL  GLUCOSE, CAPILLARY     Status: Abnormal   Collection Time    02/16/14  1:24 AM      Result Value Ref Range   Glucose-Capillary 276 (*) 70 - 99 mg/dL  GLUCOSE, CAPILLARY     Status: Abnormal   Collection Time    02/16/14  2:17 AM      Result Value Ref Range   Glucose-Capillary 278 (*) 70 - 99 mg/dL  BASIC METABOLIC PANEL     Status: Abnormal   Collection Time    02/16/14  4:18 AM      Result Value Ref Range   Sodium 134 (*) 137 - 147 mEq/L   Potassium 4.5  3.7 - 5.3 mEq/L   Chloride 102  96 - 112 mEq/L   CO2 19  19 - 32 mEq/L   Glucose, Bld 348 (*) 70 - 99 mg/dL   BUN 45 (*) 6 - 23 mg/dL   Creatinine, Ser 1.13  (*) 0.50 - 1.10 mg/dL   Calcium 9.4  8.4 - 10.5 mg/dL   GFR calc non Af Amer 46 (*) >90 mL/min   GFR calc Af Amer 54 (*) >90 mL/min   Comment: (NOTE)     The eGFR has been calculated using the CKD EPI equation.     This calculation has not been validated in all clinical situations.     eGFR's persistently <90 mL/min signify possible Chronic Kidney     Disease.   Anion gap 13  5 - 15  GLUCOSE, CAPILLARY     Status: Abnormal   Collection Time    02/16/14  7:44 AM      Result Value Ref Range   Glucose-Capillary 258 (*) 70 - 99 mg/dL  GLUCOSE, CAPILLARY     Status: Abnormal   Collection Time    02/16/14 12:13 PM      Result Value Ref Range   Glucose-Capillary 206 (*) 70 - 99 mg/dL    No results found.  Review of Systems  Constitutional: Positive for diaphoresis. Negative for fever.  HENT: Negative for congestion.   Cardiovascular: Positive for chest pain, orthopnea and PND. Negative for leg swelling.  Gastrointestinal: Positive for nausea. Negative for vomiting, abdominal pain, blood in stool and melena.  Genitourinary: Negative for hematuria.        + Polyuria  Musculoskeletal: Positive for neck pain.  Neurological: Negative for dizziness.  All other systems reviewed and are negative.  Blood pressure 114/83, pulse 69, temperature 97.4 F (36.3 C), temperature source  Oral, resp. rate 19, height 5' 7.5" (1.715 m), weight 184 lb 4.9 oz (83.6 kg), SpO2 100.00%. Physical Exam  Nursing note and vitals reviewed. Constitutional: She appears well-developed.  Obese  HENT:  Head: Normocephalic and atraumatic.  Eyes: EOM are normal. Pupils are equal, round, and reactive to light. No scleral icterus.  Neck: Normal range of motion. Neck supple. No JVD present.  Cardiovascular: Normal rate, regular rhythm, S1 normal and S2 normal.   No murmur heard. Pulses:      Radial pulses are 2+ on the right side, and 2+ on the left side.       Dorsalis pedis pulses are 2+ on the right side, and  2+ on the left side.  No carotid bruit  Respiratory: Effort normal and breath sounds normal. She has no wheezes. She has no rales.  GI: Soft. Bowel sounds are normal. She exhibits no distension. There is no tenderness.  Musculoskeletal: She exhibits no edema.  Lymphadenopathy:    She has no cervical adenopathy.  Neurological: She is alert. She exhibits normal muscle tone.  Skin: Skin is warm and dry.  Psychiatric: She has a normal mood and affect.    Assessment/Plan: Active Problems:   Unspecified hypothyroidism   Hyperlipidemia   Hyperosmolar non-ketotic state in patient with type 2 diabetes mellitus   Ischemic cardiomyopathy   Chronic systolic CHF (congestive heart failure)   CKD stage 3 due to type 2 diabetes mellitus   Chest pain  Plan:  Ruled out for MI. No acute EKG changes. It could be a musculoskeletal component to the shoulder and neck symptoms. The chest pressure/tightness is concerning however. Lexiscan cardiolite tomorrow.  Given the patient's cardiomyopathy we will KVO fluids. Strict ins and outs. Monitor daily weight. She is on aspirin, Brilinta, beta blocker, Imdur, lexiscan 36m.  We'll switch to Crestor.        HAGER, BRYAN, PA-C 02/16/2014, 1:28 PM    History and all data above reviewed.  Patient examined.  I agree with the findings as above.  The patient had somewhat atypical pain.  However, she has an extensive cardiac history as above.  She was scheduled to have a stress perfusion study yesterday.  However, she had elevated blood glucose on outside labs and was instructed to come to the ED for treatment.  We are asked to consult because she missed the stress test.  The patient exam reveals COR:RRR  ,  Lungs: Clear  ,  Abd: Positive bowel sounds, no rebound no guarding, Ext No edema  .  All available labs, radiology testing, previous records reviewed. Agree with documented assessment and plan. Chest pain:  Atypical greater than typical features.  However, she does have  known disease.  She needs stress testing but would not be able to walk on a treadmill. Therefore, she will have a [employer]    JJeneen RinksHochrein  2:32 PM  02/16/2014

## 2014-02-16 NOTE — Progress Notes (Signed)
Inpatient Diabetes Program Recommendations  AACE/ADA: New Consensus Statement on Inpatient Glycemic Control (2013)  Target Ranges:  Prepandial:   less than 140 mg/dL      Peak postprandial:   less than 180 mg/dL (1-2 hours)      Critically ill patients:  140 - 180 mg/dL   Diabetes Coordinator met with patient to instruct on insulin pen.  Pt was able to do return demonstration using the "teach back" method.  Pt or daughter will call her pharmacy to inquire about her cost of insulin pen vs vial.  Bedside nursing will begin teaching vial and syringe so that patient is comfortable with either method at discharge.  Would not recommend resuming Invokana at discharge.  Pt reports some dietary indiscretion recently.  A Diabetes Meal Planning guide was given to patient and reviewed with patient.  No questions/concerns at the end of our visit. Thank you  Raoul Pitch BSN, RN,CDE Inpatient Diabetes Coordinator [phone] (team pager)

## 2014-02-16 NOTE — Care Management Note (Addendum)
    Page 1 of 1   02/18/2014     9:15:34 AM CARE MANAGEMENT NOTE 02/18/2014  Patient:  Carol [name], Carol [name]   Account Number:  [id number]  Date Initiated:  02/16/2014  Documentation initiated by:  Elissa Hefty  Subjective/Objective Assessment:   adm w hyperglycemia     Action/Plan:   lives alone, pcp dr Cassell Clement   Anticipated DC Date:     Anticipated DC Plan:           Choice offered to / List presented to:             Status of service:   Medicare Important Message given?  YES (If response is "NO", the following Medicare IM given date fields will be blank) Date Medicare IM given:  02/18/2014 Medicare IM given by:  Elissa Hefty Date Additional Medicare IM given:   Additional Medicare IM given by:    Discharge Disposition:    Per UR Regulation:  Reviewed for med. necessity/level of care/duration of stay  If discussed at [place] of Stay Meetings, dates discussed:    Comments:

## 2014-02-16 NOTE — Plan of Care (Signed)
Problem: Phase I Progression Outcomes Goal: CBGs steadily decreasing with treatment Outcome: Progressing Blood glucose still remains >200, but much more controlled than on admission. Levemir was started and patient is receiving AC/HS aspart insulin coverage. Goal: NPO or per MD order Outcome: Completed/Met Date Met:  02/16/14 Tolerating carb modified diet without problems Goal: Pain controlled with appropriate interventions Outcome: Completed/Met Date Met:  02/16/14 Denies pain Goal: OOB as tolerated unless otherwise ordered Outcome: Completed/Met Date Met:  02/16/14 Ambulating without assistance. Gait steady  Goal: Initial discharge plan identified Outcome: Completed/Met Date Met:  02/16/14 Diabetes coordinator in to see patient today. Patient is aware that she will be discharged with insulin injections and has practiced doing them with diabetes coordinator. Patient states she has no questions at this time regarding her insulin Goal: Voiding-avoid urinary catheter unless indicated Outcome: Completed/Met Date Met:  02/16/14 Voiding in bathroom, no difficulties Goal: Hemodynamically stable Outcome: Completed/Met Date Met:  02/16/14 Vitals stable, see flowchart Goal: Diabetes Coordinator Consult Outcome: Completed/Met Date Met:  02/16/14 Met with patient today

## 2014-02-16 NOTE — Addendum Note (Signed)
Encounter addended by: Asencion Gowda, CCT on: 02/16/2014 10:01 AM<BR>     Documentation filed: Charges VN

## 2014-02-16 NOTE — Addendum Note (Signed)
Encounter addended by: Asencion Gowda, CCT on: 02/16/2014  9:59 AM<BR>     Documentation filed: Charges VN

## 2014-02-16 NOTE — Addendum Note (Signed)
Encounter addended by: Asencion Gowda, CCT on: 02/16/2014 10:00 AM<BR>     Documentation filed: Charges VN

## 2014-02-17 ENCOUNTER — Inpatient Hospital Stay (HOSPITAL_COMMUNITY): Payer: Medicare Other

## 2014-02-17 ENCOUNTER — Encounter (HOSPITAL_COMMUNITY): Payer: Self-pay | Admitting: Cardiology

## 2014-02-17 ENCOUNTER — Encounter (HOSPITAL_COMMUNITY)
Admission: EM | Disposition: A | Discharge status: Home / Self Care | Payer: Self-pay | Source: Home / Self Care | Attending: Internal Medicine

## 2014-02-17 DIAGNOSIS — I2510 Atherosclerotic heart disease of native coronary artery without angina pectoris: Secondary | ICD-10-CM

## 2014-02-17 DIAGNOSIS — R9439 Abnormal result of other cardiovascular function study: Secondary | ICD-10-CM | POA: Diagnosis not present

## 2014-02-17 DIAGNOSIS — Z955 Presence of coronary angioplasty implant and graft: Secondary | ICD-10-CM

## 2014-02-17 DIAGNOSIS — E1129 Type 2 diabetes mellitus with other diabetic kidney complication: Secondary | ICD-10-CM | POA: Diagnosis not present

## 2014-02-17 DIAGNOSIS — I237 Postinfarction angina: Secondary | ICD-10-CM | POA: Diagnosis present

## 2014-02-17 DIAGNOSIS — R7309 Other abnormal glucose: Secondary | ICD-10-CM

## 2014-02-17 DIAGNOSIS — E1100 Type 2 diabetes mellitus with hyperosmolarity without nonketotic hyperglycemic-hyperosmolar coma (NKHHC): Secondary | ICD-10-CM | POA: Diagnosis not present

## 2014-02-17 DIAGNOSIS — E785 Hyperlipidemia, unspecified: Secondary | ICD-10-CM

## 2014-02-17 DIAGNOSIS — R079 Chest pain, unspecified: Secondary | ICD-10-CM

## 2014-02-17 HISTORY — PX: PERCUTANEOUS CORONARY STENT INTERVENTION (PCI-S): SHX6016

## 2014-02-17 HISTORY — PX: LEFT HEART CATHETERIZATION WITH CORONARY ANGIOGRAM: SHX5451

## 2014-02-17 LAB — BASIC METABOLIC PANEL
ANION GAP: 16 — AB (ref 5–15)
ANION GAP: 15 (ref 5–15)
BUN: 39 mg/dL — ABNORMAL HIGH (ref 6–23)
BUN: 38 mg/dL — ABNORMAL HIGH (ref 6–23)
CHLORIDE: 103 meq/L (ref 96–112)
CO2: 18 meq/L — AB (ref 19–32)
CO2: 19 mEq/L (ref 19–32)
Calcium: 9.6 mg/dL (ref 8.4–10.5)
Calcium: 9.4 mg/dL (ref 8.4–10.5)
Chloride: 105 mEq/L (ref 96–112)
Creatinine, Ser: 1.25 mg/dL — ABNORMAL HIGH (ref 0.50–1.10)
Creatinine, Ser: 1.12 mg/dL — ABNORMAL HIGH (ref 0.50–1.10)
GFR calc Af Amer: 48 mL/min — ABNORMAL LOW (ref >90)
GFR calc non Af Amer: 41 mL/min — ABNORMAL LOW (ref >90)
GFR calc non Af Amer: 47 mL/min — ABNORMAL LOW (ref >90)
GFR, EST AFRICAN AMERICAN: 54 mL/min — AB (ref >90)
Glucose, Bld: 148 mg/dL — ABNORMAL HIGH (ref 70–99)
Glucose, Bld: 196 mg/dL — ABNORMAL HIGH (ref 70–99)
POTASSIUM: 4.4 meq/L (ref 3.7–5.3)
Potassium: 3.9 mEq/L (ref 3.7–5.3)
SODIUM: 137 meq/L (ref 137–147)
Sodium: 139 mEq/L (ref 137–147)

## 2014-02-17 LAB — GLUCOSE, CAPILLARY
GLUCOSE-CAPILLARY: 199 mg/dL — AB (ref 70–99)
GLUCOSE-CAPILLARY: 226 mg/dL — AB (ref 70–99)
Glucose-Capillary: 187 mg/dL — ABNORMAL HIGH (ref 70–99)
Glucose-Capillary: 397 mg/dL — ABNORMAL HIGH (ref 70–99)
Glucose-Capillary: 428 mg/dL — ABNORMAL HIGH (ref 70–99)

## 2014-02-17 LAB — POCT ACTIVATED CLOTTING TIME
ACTIVATED CLOTTING TIME: 315 s
Activated Clotting Time: 411 seconds

## 2014-02-17 SURGERY — LEFT HEART CATHETERIZATION WITH CORONARY ANGIOGRAM
Anesthesia: LOCAL

## 2014-02-17 MED ORDER — DIPHENHYDRAMINE HCL 50 MG/ML IJ SOLN
INTRAMUSCULAR | Status: AC
  Filled 2014-02-17: qty 1

## 2014-02-17 MED ORDER — SODIUM CHLORIDE 0.9 % IV SOLN: 250.0000 mL | INTRAVENOUS | Status: DC | PRN

## 2014-02-17 MED ORDER — REGADENOSON 0.4 MG/5ML IV SOLN
0.4000 mg | Freq: Once | INTRAVENOUS | Status: AC
  Administered 2014-02-17: 0.4 mg via INTRAVENOUS
  Filled 2014-02-17: qty 5

## 2014-02-17 MED ORDER — FAMOTIDINE IN NACL 20-0.9 MG/50ML-% IV SOLN
INTRAVENOUS | Status: AC
  Filled 2014-02-17: qty 50

## 2014-02-17 MED ORDER — TECHNETIUM TC 99M SESTAMIBI GENERIC - CARDIOLITE
10.0000 | Freq: Once | INTRAVENOUS | Status: AC | PRN
  Administered 2014-02-17: 10 via INTRAVENOUS

## 2014-02-17 MED ORDER — MIDAZOLAM HCL 2 MG/2ML IJ SOLN
INTRAMUSCULAR | Status: AC
  Filled 2014-02-17: qty 2

## 2014-02-17 MED ORDER — METHYLPREDNISOLONE SODIUM SUCC 125 MG IJ SOLR
INTRAMUSCULAR | Status: AC
  Filled 2014-02-17: qty 2

## 2014-02-17 MED ORDER — ACETAMINOPHEN 325 MG PO TABS
ORAL_TABLET | ORAL | Status: AC
  Filled 2014-02-17: qty 2

## 2014-02-17 MED ORDER — ACETAMINOPHEN 325 MG PO TABS
650.0000 mg | ORAL_TABLET | ORAL | Status: DC | PRN
  Administered 2014-02-17: 650 mg via ORAL
  Filled 2014-02-17: qty 2

## 2014-02-17 MED ORDER — NITROGLYCERIN 0.4 MG SL SUBL
SUBLINGUAL_TABLET | SUBLINGUAL | Status: AC
  Filled 2014-02-17: qty 1

## 2014-02-17 MED ORDER — INSULIN ASPART 100 UNIT/ML ~~LOC~~ SOLN
0.0000 [IU] | SUBCUTANEOUS | Status: DC
  Administered 2014-02-17 (×2): 2 [IU] via SUBCUTANEOUS

## 2014-02-17 MED ORDER — SODIUM CHLORIDE 0.9 % IJ SOLN
3.0000 mL | Freq: Two times a day (BID) | INTRAMUSCULAR | Status: DC
  Administered 2014-02-17: 3 mL via INTRAVENOUS

## 2014-02-17 MED ORDER — INSULIN DETEMIR 100 UNIT/ML ~~LOC~~ SOLN
15.0000 [IU] | Freq: Two times a day (BID) | SUBCUTANEOUS | Status: DC
  Administered 2014-02-17 (×2): 15 [IU] via SUBCUTANEOUS
  Filled 2014-02-17 (×3): qty 0.15

## 2014-02-17 MED ORDER — SODIUM CHLORIDE 0.9 % IV SOLN
1.0000 mL/kg/h | INTRAVENOUS | Status: DC
  Administered 2014-02-17: 1 mL/kg/h via INTRAVENOUS

## 2014-02-17 MED ORDER — TECHNETIUM TC 99M SESTAMIBI GENERIC - CARDIOLITE
30.0000 | Freq: Once | INTRAVENOUS | Status: AC | PRN
  Administered 2014-02-17: 30 via INTRAVENOUS

## 2014-02-17 MED ORDER — ACETAMINOPHEN 325 MG PO TABS
650.0000 mg | ORAL_TABLET | Freq: Once | ORAL | Status: AC
  Administered 2014-02-17: 650 mg via ORAL

## 2014-02-17 MED ORDER — REGADENOSON 0.4 MG/5ML IV SOLN
INTRAVENOUS | Status: AC
  Filled 2014-02-17: qty 5

## 2014-02-17 MED ORDER — SODIUM CHLORIDE 0.9 % IV SOLN
INTRAVENOUS | Status: AC
  Administered 2014-02-17: 19:00:00 via INTRAVENOUS

## 2014-02-17 MED ORDER — INSULIN ASPART 100 UNIT/ML ~~LOC~~ SOLN
0.0000 [IU] | Freq: Three times a day (TID) | SUBCUTANEOUS | Status: DC
  Administered 2014-02-17: 3 [IU] via SUBCUTANEOUS

## 2014-02-17 MED ORDER — NITROGLYCERIN 1 MG/10 ML FOR IR/CATH LAB
INTRA_ARTERIAL | Status: AC
  Filled 2014-02-17: qty 10

## 2014-02-17 MED ORDER — NITROGLYCERIN 0.4 MG SL SUBL
0.4000 mg | SUBLINGUAL_TABLET | SUBLINGUAL | Status: DC | PRN
  Administered 2014-02-17: 0.4 mg via SUBLINGUAL

## 2014-02-17 MED ORDER — DIPHENHYDRAMINE HCL 50 MG/ML IJ SOLN: 25.0000 mg | INTRAMUSCULAR | Status: AC

## 2014-02-17 MED ORDER — LIDOCAINE HCL (PF) 1 % IJ SOLN
INTRAMUSCULAR | Status: AC
  Filled 2014-02-17: qty 30

## 2014-02-17 MED ORDER — SODIUM CHLORIDE 0.9 % IJ SOLN: 3.0000 mL | INTRAMUSCULAR | Status: DC | PRN

## 2014-02-17 MED ORDER — HEPARIN SODIUM (PORCINE) 1000 UNIT/ML IJ SOLN
INTRAMUSCULAR | Status: AC
  Filled 2014-02-17: qty 1

## 2014-02-17 MED ORDER — FENTANYL CITRATE 0.05 MG/ML IJ SOLN
INTRAMUSCULAR | Status: AC
  Filled 2014-02-17: qty 2

## 2014-02-17 MED ORDER — METHYLPREDNISOLONE SODIUM SUCC 125 MG IJ SOLR
125.0000 mg | INTRAMUSCULAR | Status: AC
Start: 2014-02-17 — End: 2014-02-17

## 2014-02-17 MED ORDER — HEPARIN (PORCINE) IN NACL 2-0.9 UNIT/ML-% IJ SOLN
INTRAMUSCULAR | Status: AC
  Filled 2014-02-17: qty 1500

## 2014-02-17 MED ORDER — FAMOTIDINE IN NACL 20-0.9 MG/50ML-% IV SOLN
20.0000 mg | INTRAVENOUS | Status: AC
  Filled 2014-02-17: qty 50

## 2014-02-17 NOTE — Interval H&P Note (Signed)
History and Physical Interval Note:  02/17/2014 4:33 PM  Carol [name]  has presented today for cardiac cath with the diagnosis of unstable angina, CAD.  The various methods of treatment have been discussed with the patient and family. After consideration of risks, benefits and other options for treatment, the patient has consented to  Procedure(s): LEFT HEART CATHETERIZATION WITH CORONARY ANGIOGRAM (N/A) as a surgical intervention .  The patient's history has been reviewed, patient examined, no change in status, stable for surgery.  I have reviewed the patient's chart and labs.  Questions were answered to the patient's satisfaction.    Cath Lab Visit (complete for each Cath Lab visit)  Clinical Evaluation Leading to the Procedure:   ACS: No.  Non-ACS:    Anginal Classification: CCS IV  Anti-ischemic medical therapy: Maximal Therapy (2 or more classes of medications)  Non-Invasive Test Results: Intermediate-risk stress test findings: cardiac mortality 1-3%/year  Prior CABG: No previous CABG        MCALHANY,CHRISTOPHER

## 2014-02-17 NOTE — Progress Notes (Signed)
PROGRESS NOTE    Carol [name] UXL:[id number] DOB: 1935/07/06 DOA: 02/15/2014 PCP: Lillard Anes, MD  HPI/Brief narrative  78 year old female with history of DM 2, HTN, HLD, ischemic cardiomyopathy with recent NSTEMI in the first week of August 2015, experienced some jaw pain on 02/10/14 and went to the cardiologist's office on 02/15/14 for Myoview stress test and lab work. However her blood glucose was 783, she was complaining of DOE and intermittent jaw discomfort. She claimed compliance to her medications including diabetic medications. She however stopped taking Lipitor secondary to severe muscle pains in her legs with difficulty ambulating. She was sent to the hospital for admission. In the ED, blood glucose 779, bicarbonate 18, anion gap 18 and serum creatinine 1.34. She was admitted to step down on an insulin drip which has since been transitioned to maintenance dose insulins and SSI. Cardiology consulted on 8/25 for evaluation of jaw pain/ischemia.    Assessment/Plan:    1. Hyperosmolar nonketotic state secondary to poorly controlled DM 2: Admitted to step down. Treated with IV insulin, IV fluids and transitioned to Levemir BID and SSI. Last A1c on 7/31:9.1. Pain seen by diabetic coordinator, Glucophage will be resumed outpatient by PCP.   2. Jaw pain/? Angina equivalent/recent NSTEMI/Isch CM/nonobstructive CAD s/p PTCA of OM2: Patient denies any jaw pain at this time. Troponins x4 negative. Cardiology consulted going for stress test later today. Echo 01/2014: EF 25-30%. Continue carvedilol, aspirin, Brilinta & Imdur. Intolerant of statins > defer to cards re alternate agent.   3. Chronic systolic CHF: Compensated. Continue Lasix 20 mg daily.   4. Hyperlipidemia: Intolerant of Zocor in past and Lipitor now-lower extremity muscle aches. DC Lipitor. May consider alternate group > defer to cards.    5. Hypothyroid: Continue Armour Thyroid. TSH 0.564   6. Stage III  chronic kidney disease. Baseline creatinine 1.1-1.3. Stable.   7. Hypertension: Reasonably controlled. As per cardiology office notes, issues with orthostatic as outpatient. Monitor closely and if still has issues with same, may need to cut back on medications.     Code Status: Full Family Communication: Discussed with patient's daughter at bedside. Disposition Plan: Move to telemetry bed post stress test   Consultants:  Cardiology  Procedures:  None  Antibiotics:  None   DVT prophylaxis Lovenox.     Subjective:  Denies jaw pain or leg cramps. No chest pain or dyspnea. Denies dysuria, fever or chills.  Objective: Filed Vitals:   02/17/14 0950 02/17/14 1037 02/17/14 1039 02/17/14 1041  BP: 115/57 136/69 127/46 126/50  Pulse: 71 86 90 86  Temp:      TempSrc:      Resp: 16     Height:      Weight:      SpO2: 99%       Intake/Output Summary (Last 24 hours) at 02/17/14 1204 Last data filed at 02/17/14 0830  Gross per 24 hour  Intake    630 ml  Output   2850 ml  Net  -2220 ml   Filed Weights   02/15/14 2100 02/17/14 0329  Weight: 83.6 kg (184 lb 4.9 oz) 83.6 kg (184 lb 4.9 oz)     Exam:  General exam: Pleasant elderly female sitting up comfortably in bed. Respiratory system: Clear. No increased work of breathing. Cardiovascular system: S1 & S2 heard, RRR. No JVD, murmurs, gallops, clicks or pedal edema. Gastrointestinal system: Abdomen is nondistended, soft and nontender. Normal bowel sounds heard. Central nervous system: Alert and oriented. No  focal neurological deficits. Extremities: Symmetric 5 x 5 power.   Data Reviewed: Basic Metabolic Panel:  Recent Labs Lab 02/15/14 1759 02/15/14 2150 02/16/14 0418 02/17/14 0218 02/17/14 0805  NA 136* 138 134* 137 139  K 4.0 4.5 4.5 3.9 4.4  CL 100 102 102 103 105  CO2 _0 18* 19  GLUCOSE 135* 197* 348* 148* 196*  BUN 49* 46* 45* 39* 38*  CREATININE 1.19* 1.16* 1.13* 1.25* 1.12*  CALCIUM 9.7  9.6 9.4 9.6 9.4   Liver Function Tests: No results found for this basename: AST, ALT, ALKPHOS, BILITOT, PROT, ALBUMIN,  in the last 168 hours No results found for this basename: LIPASE, AMYLASE,  in the last 168 hours No results found for this basename: AMMONIA,  in the last 168 hours CBC:  Recent Labs Lab 02/15/14 1212  WBC 5.5  NEUTROABS 3.3  HGB 12.1  HCT 37.2  MCV 84.5  PLT 206   Cardiac Enzymes:  Recent Labs Lab 02/15/14 0934 02/15/14 1212 02/15/14 1800 02/16/14  TROPONINI <0.30 <0.30 <0.30 <0.30   BNP (last 3 results)  Recent Labs  01/22/14 1104  PROBNP 202.7   CBG:  Recent Labs Lab 02/16/14 1213 02/16/14 1636 02/16/14 2106 02/17/14 0758 02/17/14 1151  GLUCAP 206* 277* 295* 199* 187*    Recent Results (from the past 240 hour(s))  MRSA PCR SCREENING     Status: None   Collection Time    02/15/14  8:15 PM      Result Value Ref Range Status   MRSA by PCR NEGATIVE  NEGATIVE Final   Comment:            The GeneXpert MRSA Assay (FDA     approved for NASAL specimens     only), is one component of a     comprehensive MRSA colonization     surveillance program. It is not     intended to diagnose MRSA     infection nor to guide or     monitor treatment for     MRSA infections.       Studies: No results found.      Scheduled Meds: . acetaminophen      . allopurinol  300 mg Oral Daily  . aspirin EC  81 mg Oral Daily  . carvedilol  6.25 mg Oral BID WC  . enoxaparin (LOVENOX) injection  40 mg Subcutaneous Q24H  . furosemide  20 mg Oral Daily  . insulin aspart  0-9 Units Subcutaneous Q4H  . insulin detemir  15 Units Subcutaneous BID  . insulin starter kit- pen needles  1 kit Other Once  . isosorbide mononitrate  30 mg Oral Daily  . pantoprazole  40 mg Oral Daily  . rosuvastatin  20 mg Oral QHS  . thyroid  120 mg Oral Daily  . ticagrelor  90 mg Oral BID   Continuous Infusions:    Active Problems:   Unspecified hypothyroidism    Hyperlipidemia   Hyperosmolar non-ketotic state in patient with type 2 diabetes mellitus   Ischemic cardiomyopathy   Chronic systolic CHF (congestive heart failure)   CKD stage 3 due to type 2 diabetes mellitus    Time spent: 45 minutes.    Thurnell Lose, MD, FACP, FHM. [facility] Pager [phone]  If 7PM-7AM, please contact night-coverage [email or web address] Password [email or web address] 02/17/2014, 12:04 PM    LOS: 2 days

## 2014-02-17 NOTE — CV Procedure (Signed)
Cardiac Catheterization Operative Report  Carol [name] [id number] 8/26/20155:59 PM Lillard Anes, MD  Procedure Performed:  1. Left Heart Catheterization 2. Selective Coronary Angiography 3. Left ventricular angiogram 4. PTCA/DES x 2 mid OM2  Operator: Lauree Chandler, MD  Arterial access site:  Left radial artery.   Indication:  78 yo female with history of DM, HTN, HLD and CAD admitted with unstable angina. Recent NSTEMI with cath 01/22/14 showing severe stenosis in the OM2. Balloon angioplasty only with no stent placement at that time. She has had ongoing chest pain since then. Troponin negative this admission. Stress myoview today with perfusion defect in the lateral wall.                                      Procedure Details: The risks, benefits, complications, treatment options, and expected outcomes were discussed with the patient. The patient and/or family concurred with the proposed plan, giving informed consent. The patient was brought to the cath lab after IV hydration was begun and oral premedication was given. The patient was further sedated with Versed and Fentanyl. The left wrist was assessed with a reverse Allens test which was positive. The left wrist was prepped and draped in a sterile fashion. 1% lidocaine was used for local anesthesia. Using the modified Seldinger access technique, a 5 French sheath was placed in the left radial artery. 3 mg Verapamil was given through the sheath. 4000 units IV heparin was given. Standard diagnostic catheters were used to perform selective coronary angiography. A pigtail catheter was used to perform a left ventricular angiogram. She was found to have severe stenosis in the second obtuse marginal branch. I elected to proceed to PCI as this territory was consistent with the defect on the stress test.   PCI Note: She was given an additional 6000 Units IV heparin for a total of 10,000 units. The left main was engaged  with a XB 3.0 guiding catheter. When the ACT was over 200, I passed a Cougar wire down the Circumflex artery into the intermediate branch and left it there for support. I then passed a second Cougar wire down the vessel into the OM2 branch. A 2.0 x 8 mm balloon was used to pre-dilate the stenosis. A 2.25 x 12 mm Xience DES was deployed in the OM2. It was very difficult to visualize the entire lesion as the vessel was straightened by the wire. There was a slight haziness on the front edge of the stent, likely due to incomplete coverage of the stenosis. I then deployed a 2.25 x 8 mm Xience DES overlapping the first stent proximally. The stents were post-dilated with a 2.25 x 12 mm Manila balloon x 2. The stenosis was taken from 99% down to 0%.   The sheath was removed from the right radial artery and a Terumo hemostasis band was applied at the arteriotomy site on the right wrist. There were no immediate complications. The patient was taken to the recovery area in stable condition.   Hemodynamic Findings: Central aortic pressure: 128/66 Left ventricular pressure: 129/4/10  Angiographic Findings:  Left main: 20% mid stenosis.   Left Anterior Descending Artery: Large caliber vessel that courses to the apex. The proximal vessel has a 40-50% stenosis. The mid vessel has a focal 30% stenosis. The diagonal branch is small caliber with diffuse mild plaque.   Circumflex Artery: Moderate caliber vessel with  a very small caliber intermediate branch, a very small caliber first OM branch and a moderate calliber second obtuse marginal branch. The proximal Circumflex has a focal 30% stenosis. The intermediate branch is very small in caliber with proximal 50% stenosis. The second obtuse marginal branch has a focal 99% stenosis. The mid AV groove Circumflex has a 40% stenosis beyond the takeoff of the second OM branch.   Right Coronary Artery: Large caliber dominant vessel with small caliber PDA. The proximal vessel has a  30% stenosis. The mid vessel has diffuse 40% stenosis. The distal vessel tapers to a small caliber vessel. The PDA is a 1.5 mm vessel. There is a 90% stenosis in the mid PDA, unchanged from last cath.   Left Ventricular Angiogram: LVEF=60-65%.   Impression: 1. Severe single vessel CAD 2. Severe stenosis OM2 3. Normal LV function 4. Successful PTCA/DES x 2 OM2 5. Severe stenosis very small caliber PDA  Recommendations: Medical management including ASA, Brilinta, statin, beta blocker, Imdur.        Complications:  None. The patient tolerated the procedure well.

## 2014-02-17 NOTE — H&P (View-Only) (Signed)
Patient: Carol [name] / Admit Date: 02/15/2014 / Date of Encounter: 02/17/2014, 3:12 PM   Subjective: Feels OK this AM. C/o transient SOB, chest pressure with Lexiscan that eased off in recovery. VSS. Headache remains without other focal neuro sx, thus tylenol was ordered.  Objective: Telemetry: NSR no significant heart block or arrhythmias, rare PVC Physical Exam: Blood pressure 120/57, pulse 66, temperature 97.2 F (36.2 C), temperature source Oral, resp. rate 14, height 5' 7.5" (1.715 m), weight 184 lb 4.9 oz (83.6 kg), SpO2 100.00%. General: Well developed, well nourished WF, in no acute distress. Head: Normocephalic, atraumatic, sclera non-icteric, no xanthomas, nares are without discharge. Neck: JVP not elevated. Lungs: Clear bilaterally to auscultation without wheezes, rales, or rhonchi. Breathing is unlabored. Heart: RRR S1 S2 without murmurs, rubs, or gallops.  Abdomen: Soft, non-tender, non-distended with normoactive bowel sounds. No rebound/guarding. Extremities: No clubbing or cyanosis. No edema. Distal pedal pulses are 2+ and equal bilaterally. Neuro: Alert and oriented X 3. Moves all extremities spontaneously. Psych:  Responds to questions appropriately with a normal affect.   Intake/Output Summary (Last 24 hours) at 02/17/14 1512 Last data filed at 02/17/14 0830  Gross per 24 hour  Intake    490 ml  Output   2200 ml  Net  -1710 ml    Inpatient Medications:  . acetaminophen      . allopurinol  300 mg Oral Daily  . aspirin EC  81 mg Oral Daily  . carvedilol  6.25 mg Oral BID WC  . enoxaparin (LOVENOX) injection  40 mg Subcutaneous Q24H  . furosemide  20 mg Oral Daily  . insulin aspart  0-9 Units Subcutaneous Q4H  . insulin detemir  15 Units Subcutaneous BID  . insulin starter kit- pen needles  1 kit Other Once  . isosorbide mononitrate  30 mg Oral Daily  . pantoprazole  40 mg Oral Daily  . rosuvastatin  20 mg Oral QHS  . thyroid  120 mg Oral Daily  .  ticagrelor  90 mg Oral BID   Infusions:    Labs:  Recent Labs  02/17/14 0218 02/17/14 0805  NA 137 139  K 3.9 4.4  CL 103 105  CO2 18* 19  GLUCOSE 148* 196*  BUN 39* 38*  CREATININE 1.25* 1.12*  CALCIUM 9.6 9.4    Recent Labs  02/15/14 1212  WBC 5.5  NEUTROABS 3.3  HGB 12.1  HCT 37.2  MCV 84.5  PLT 206    Recent Labs  02/15/14 0934 02/15/14 1212 02/15/14 1800 02/16/14  TROPONINI <0.30 <0.30 <0.30 <0.30    Radiology/Studies:  Ct Chest Wo Contrast 01/22/2014   CLINICAL DATA:  Intermittent chest pain for 1 month worse last night described is squeezing sharp pain associated was shortness of breath, lightheadedness and nausea, diaphoresis, history hypertension, diabetes, gout, asthma  EXAM: CT CHEST WITHOUT CONTRAST  TECHNIQUE: Multidetector CT imaging of the chest was performed following the standard protocol without IV contrast. Sagittal and coronal MPR images reconstructed from axial data set. IV contrast not administered due to history of contrast allergy.  COMPARISON:  05/09/2009 CT chest, chest radiograph 01/22/2014  FINDINGS: Scattered atherosclerotic calcifications aorta and coronary arteries.  No thoracic adenopathy.  Moderate-sized hiatal hernia.  Calcified granulomata within spleen.  Remaining visualized portions of upper abdomen unremarkable.  Suspect tiny LEFT lower lobe tiny calcified granuloma image 23 present on previous exam as well.  Lungs clear.  No infiltrate, pleural effusion or pneumothorax.  Bones unremarkable.  IMPRESSION: Old   granulomatous disease.  No acute intra thoracic abnormalities.   Electronically Signed   By: Lavonia Dana M.D.   On: 01/22/2014 13:12   Dg Chest Port 1 View 01/22/2014   CLINICAL DATA:  Chest pain and shortness of breath.  EXAM: PORTABLE CHEST - 1 VIEW  COMPARISON:  12/18/2010  FINDINGS: Lungs are adequately inflated without consolidation or effusion. Cardiomediastinal silhouette and remainder of the exam is unchanged.  IMPRESSION: No  active disease.   Electronically Signed   By: Marin Olp M.D.   On: 01/22/2014 11:16     Assessment and Plan  1. Hyperosmolar nonketotic state secondary to poorly controlled DM 2 2. CAD with recent NSTEMI 7/31-8/3 s/p PTCA of OM2 (no stenting) 3. Recent drop in LV function - chronic systolic CHF EF 16-07% 4. Hyperlipidemia with intolerance to Zocor, muscle aches on Lipitor 5. Hypothyroidism 6. CKD stage III 7. HTN with h/o orthostasis as outpatient  Patient seen briefly in nuc med, MD to follow. Continue present med regimen. ACEI is on hold per last office note with Dr. Haroldine Laws in setting of suspected dehydration and also has had orthostatic hypotension. She was not on scheduled lasix but is on it here - will monitor closely. (Weight is up 6 lbs from OV 8/24.) Await nuc results.  Signed, Melina Copa PA-C  I have seen and evaluated the patient this afternoon following her stress test performed by Melina Copa, I agree with her evaluation and examination.  She was admitted for what sounds like somewhat atypical type of symptoms for possible angina but it is less than a month out from nondistended. He had an episode about a week ago that woke her up from sleep where she described a sensation that L. and on her chest she does note exertional dyspnea, and intermittent episodes of chest discomfort both at rest and with exertion. She also had episode yesterday at the heart failure clinic of significant discomfort in her jaw and chest that she describes as an elephant sitting on her.  She thinks it may be more related to neck discomfort, however a nuclear stress test was ordered for now reveals what amounts to be a large lateral infarct that seems out of proportion with the extent of her MI earlier this month.  With possible anginal symptoms that are with rest and exertion/unstable in a patient who is status post recent angioplasty and now has an abnormal nuclear stress test I think the most prudent  course of action is to proceed with diagnostic cardiac catheterization to reevaluate her circumflex OM territory.  I did review the catheterization films and stress test films with Dr. Darlina Guys who was original primary cardiologist, and we both agreed that with her symptoms being somewhat concerning and the discrepancy in ejection fractions as well, that she probably warrants invasive evaluation with catheterization.  She is also noted symptoms that are somewhat concerning for myalgias with her statin. She has stopped taking it.  I do agree with using Imdur, especially with small vessel disease and a known PDA lesion.  Active Problems:   Post-infarction angina   Abnormal nuclear stress test: Intermediate risk   Unspecified hypothyroidism   Hyperlipidemia   Hyperosmolar non-ketotic state in patient with type 2 diabetes mellitus   Ischemic cardiomyopathy   Chronic systolic CHF (congestive heart failure)   CKD stage 3 due to type 2 diabetes mellitus  Plan: With post infarct angina symptoms and abnormal stress test within a month status post PTCA for non-STEMI,  will refer for left heart catheterization plus or minus angioplasty/stent.  Performing MD:  Dr. Christopher McAlhany  Procedure:  Left Heart Catheterization with Coronary Angiography Plus or Minus PCI  The procedure with Risks/Benefits/Alternatives and Indications was reviewed with the patient and her 2 daughters.  All questions were answered.    Risks / Complications include, but not limited to: Death, MI, CVA/TIA, VF/VT (with defibrillation), Bradycardia (need for temporary pacer placement), contrast induced nephropathy, bleeding / bruising / hematoma / pseudoaneurysm, vascular or coronary injury (with possible emergent CT or Vascular Surgery), adverse medication reactions, infection.    The patient (and family) voice understanding and agree to proceed.     Carol [name], M.D., M.S. Interventional Cardiologist   Pager #  [phone] 02/17/2014      

## 2014-02-17 NOTE — Progress Notes (Addendum)
Patient: Carol [name] / Admit Date: 02/15/2014 / Date of Encounter: 02/17/2014, 3:12 PM   Subjective: Feels OK this AM. C/o transient SOB, chest pressure with Lexiscan that eased off in recovery. VSS. Headache remains without other focal neuro sx, thus tylenol was ordered.  Objective: Telemetry: NSR no significant heart block or arrhythmias, rare PVC Physical Exam: Blood pressure 120/57, pulse 66, temperature 97.2 F (36.2 C), temperature source Oral, resp. rate 14, height 5' 7.5" (1.715 m), weight 184 lb 4.9 oz (83.6 kg), SpO2 100.00%. General: Well developed, well nourished WF, in no acute distress. Head: Normocephalic, atraumatic, sclera non-icteric, no xanthomas, nares are without discharge. Neck: JVP not elevated. Lungs: Clear bilaterally to auscultation without wheezes, rales, or rhonchi. Breathing is unlabored. Heart: RRR S1 S2 without murmurs, rubs, or gallops.  Abdomen: Soft, non-tender, non-distended with normoactive bowel sounds. No rebound/guarding. Extremities: No clubbing or cyanosis. No edema. Distal pedal pulses are 2+ and equal bilaterally. Neuro: Alert and oriented X 3. Moves all extremities spontaneously. Psych:  Responds to questions appropriately with a normal affect.   Intake/Output Summary (Last 24 hours) at 02/17/14 1512 Last data filed at 02/17/14 0830  Gross per 24 hour  Intake    490 ml  Output   2200 ml  Net  -1710 ml    Inpatient Medications:  . acetaminophen      . allopurinol  300 mg Oral Daily  . aspirin EC  81 mg Oral Daily  . carvedilol  6.25 mg Oral BID WC  . enoxaparin (LOVENOX) injection  40 mg Subcutaneous Q24H  . furosemide  20 mg Oral Daily  . insulin aspart  0-9 Units Subcutaneous Q4H  . insulin detemir  15 Units Subcutaneous BID  . insulin starter kit- pen needles  1 kit Other Once  . isosorbide mononitrate  30 mg Oral Daily  . pantoprazole  40 mg Oral Daily  . rosuvastatin  20 mg Oral QHS  . thyroid  120 mg Oral Daily  .  ticagrelor  90 mg Oral BID   Infusions:    Labs:  Recent Labs  02/17/14 0218 02/17/14 0805  NA 137 139  K 3.9 4.4  CL 103 105  CO2 18* 19  GLUCOSE 148* 196*  BUN 39* 38*  CREATININE 1.25* 1.12*  CALCIUM 9.6 9.4    Recent Labs  02/15/14 1212  WBC 5.5  NEUTROABS 3.3  HGB 12.1  HCT 37.2  MCV 84.5  PLT 206    Recent Labs  02/15/14 0934 02/15/14 1212 02/15/14 1800 02/16/14  TROPONINI <0.30 <0.30 <0.30 <0.30    Radiology/Studies:  Ct Chest Wo Contrast 01/22/2014   CLINICAL DATA:  Intermittent chest pain for 1 month worse last night described is squeezing sharp pain associated was shortness of breath, lightheadedness and nausea, diaphoresis, history hypertension, diabetes, gout, asthma  EXAM: CT CHEST WITHOUT CONTRAST  TECHNIQUE: Multidetector CT imaging of the chest was performed following the standard protocol without IV contrast. Sagittal and coronal MPR images reconstructed from axial data set. IV contrast not administered due to history of contrast allergy.  COMPARISON:  05/09/2009 CT chest, chest radiograph 01/22/2014  FINDINGS: Scattered atherosclerotic calcifications aorta and coronary arteries.  No thoracic adenopathy.  Moderate-sized hiatal hernia.  Calcified granulomata within spleen.  Remaining visualized portions of upper abdomen unremarkable.  Suspect tiny LEFT lower lobe tiny calcified granuloma image 23 present on previous exam as well.  Lungs clear.  No infiltrate, pleural effusion or pneumothorax.  Bones unremarkable.  IMPRESSION: Old  granulomatous disease.  No acute intra thoracic abnormalities.   Electronically Signed   By: Lavonia Dana M.D.   On: 01/22/2014 13:12   Dg Chest Port 1 View 01/22/2014   CLINICAL DATA:  Chest pain and shortness of breath.  EXAM: PORTABLE CHEST - 1 VIEW  COMPARISON:  12/18/2010  FINDINGS: Lungs are adequately inflated without consolidation or effusion. Cardiomediastinal silhouette and remainder of the exam is unchanged.  IMPRESSION: No  active disease.   Electronically Signed   By: Marin Olp M.D.   On: 01/22/2014 11:16     Assessment and Plan  1. Hyperosmolar nonketotic state secondary to poorly controlled DM 2 2. CAD with recent NSTEMI 7/31-8/3 s/p PTCA of OM2 (no stenting) 3. Recent drop in LV function - chronic systolic CHF EF 16-07% 4. Hyperlipidemia with intolerance to Zocor, muscle aches on Lipitor 5. Hypothyroidism 6. CKD stage III 7. HTN with h/o orthostasis as outpatient  Patient seen briefly in nuc med, MD to follow. Continue present med regimen. ACEI is on hold per last office note with Dr. Haroldine Laws in setting of suspected dehydration and also has had orthostatic hypotension. She was not on scheduled lasix but is on it here - will monitor closely. (Weight is up 6 lbs from OV 8/24.) Await nuc results.  Signed, Melina Copa PA-C  I have seen and evaluated the patient this afternoon following her stress test performed by Melina Copa, I agree with her evaluation and examination.  She was admitted for what sounds like somewhat atypical type of symptoms for possible angina but it is less than a month out from nondistended. He had an episode about a week ago that woke her up from sleep where she described a sensation that L. and on her chest she does note exertional dyspnea, and intermittent episodes of chest discomfort both at rest and with exertion. She also had episode yesterday at the heart failure clinic of significant discomfort in her jaw and chest that she describes as an elephant sitting on her.  She thinks it may be more related to neck discomfort, however a nuclear stress test was ordered for now reveals what amounts to be a large lateral infarct that seems out of proportion with the extent of her MI earlier this month.  With possible anginal symptoms that are with rest and exertion/unstable in a patient who is status post recent angioplasty and now has an abnormal nuclear stress test I think the most prudent  course of action is to proceed with diagnostic cardiac catheterization to reevaluate her circumflex OM territory.  I did review the catheterization films and stress test films with Dr. Darlina Guys who was original primary cardiologist, and we both agreed that with her symptoms being somewhat concerning and the discrepancy in ejection fractions as well, that she probably warrants invasive evaluation with catheterization.  She is also noted symptoms that are somewhat concerning for myalgias with her statin. She has stopped taking it.  I do agree with using Imdur, especially with small vessel disease and a known PDA lesion.  Active Problems:   Post-infarction angina   Abnormal nuclear stress test: Intermediate risk   Unspecified hypothyroidism   Hyperlipidemia   Hyperosmolar non-ketotic state in patient with type 2 diabetes mellitus   Ischemic cardiomyopathy   Chronic systolic CHF (congestive heart failure)   CKD stage 3 due to type 2 diabetes mellitus  Plan: With post infarct angina symptoms and abnormal stress test within a month status post PTCA for non-STEMI,  will refer for left heart catheterization plus or minus angioplasty/stent.  Performing MD:  Dr. Lauree Chandler  Procedure:  Left Heart Catheterization with Coronary Angiography Plus or Minus PCI  The procedure with Risks/Benefits/Alternatives and Indications was reviewed with the patient and her 2 daughters.  All questions were answered.    Risks / Complications include, but not limited to: Death, MI, CVA/TIA, VF/VT (with defibrillation), Bradycardia (need for temporary pacer placement), contrast induced nephropathy, bleeding / bruising / hematoma / pseudoaneurysm, vascular or coronary injury (with possible emergent CT or Vascular Surgery), adverse medication reactions, infection.    The patient (and family) voice understanding and agree to proceed.     Leonie Man, M.D., M.S. Interventional Cardiologist   Pager #  [phone] 02/17/2014

## 2014-02-18 ENCOUNTER — Encounter (HOSPITAL_COMMUNITY): Payer: Self-pay | Admitting: Cardiology

## 2014-02-18 DIAGNOSIS — I238 Other current complications following acute myocardial infarction: Secondary | ICD-10-CM

## 2014-02-18 DIAGNOSIS — E1100 Type 2 diabetes mellitus with hyperosmolarity without nonketotic hyperglycemic-hyperosmolar coma (NKHHC): Secondary | ICD-10-CM | POA: Diagnosis not present

## 2014-02-18 DIAGNOSIS — E1165 Type 2 diabetes mellitus with hyperglycemia: Secondary | ICD-10-CM

## 2014-02-18 DIAGNOSIS — I2510 Atherosclerotic heart disease of native coronary artery without angina pectoris: Secondary | ICD-10-CM

## 2014-02-18 DIAGNOSIS — E118 Type 2 diabetes mellitus with unspecified complications: Secondary | ICD-10-CM

## 2014-02-18 DIAGNOSIS — R9439 Abnormal result of other cardiovascular function study: Secondary | ICD-10-CM

## 2014-02-18 DIAGNOSIS — Z9861 Coronary angioplasty status: Secondary | ICD-10-CM

## 2014-02-18 DIAGNOSIS — IMO0002 Reserved for concepts with insufficient information to code with codable children: Secondary | ICD-10-CM

## 2014-02-18 DIAGNOSIS — E1129 Type 2 diabetes mellitus with other diabetic kidney complication: Secondary | ICD-10-CM | POA: Diagnosis not present

## 2014-02-18 LAB — CBC
HCT: 38.6 % (ref 36.0–46.0)
HEMOGLOBIN: 12.9 g/dL (ref 12.0–15.0)
MCH: 28.5 pg (ref 26.0–34.0)
MCHC: 33.4 g/dL (ref 30.0–36.0)
MCV: 85.2 fL (ref 78.0–100.0)
Platelets: 157 10*3/uL (ref 150–400)
RBC: 4.53 MIL/uL (ref 3.87–5.11)
RDW: 16.1 % — ABNORMAL HIGH (ref 11.5–15.5)
WBC: 8.7 10*3/uL (ref 4.0–10.5)

## 2014-02-18 LAB — GLUCOSE, CAPILLARY
GLUCOSE-CAPILLARY: 267 mg/dL — AB (ref 70–99)
Glucose-Capillary: 505 mg/dL — ABNORMAL HIGH (ref 70–99)
Glucose-Capillary: 409 mg/dL — ABNORMAL HIGH (ref 70–99)
Glucose-Capillary: 230 mg/dL — ABNORMAL HIGH (ref 70–99)
Glucose-Capillary: 183 mg/dL — ABNORMAL HIGH (ref 70–99)

## 2014-02-18 LAB — BASIC METABOLIC PANEL
Anion gap: 20 — ABNORMAL HIGH (ref 5–15)
BUN: 33 mg/dL — AB (ref 6–23)
CHLORIDE: 102 meq/L (ref 96–112)
CO2: 17 mEq/L — ABNORMAL LOW (ref 19–32)
Calcium: 10.2 mg/dL (ref 8.4–10.5)
Creatinine, Ser: 0.99 mg/dL (ref 0.50–1.10)
GFR calc Af Amer: 63 mL/min — ABNORMAL LOW (ref >90)
GFR calc non Af Amer: 54 mL/min — ABNORMAL LOW (ref >90)
GLUCOSE: 314 mg/dL — AB (ref 70–99)
POTASSIUM: 4.6 meq/L (ref 3.7–5.3)
Sodium: 139 mEq/L (ref 137–147)

## 2014-02-18 MED ORDER — COLCHICINE 0.6 MG PO TABS
0.6000 mg | ORAL_TABLET | Freq: Two times a day (BID) | ORAL | Status: DC
  Administered 2014-02-18 – 2014-02-19 (×3): 0.6 mg via ORAL
  Filled 2014-02-18 (×4): qty 1

## 2014-02-18 MED ORDER — INSULIN ASPART 100 UNIT/ML ~~LOC~~ SOLN: 0.0000 [IU] | Freq: Every day | SUBCUTANEOUS | Status: DC

## 2014-02-18 MED ORDER — INSULIN ASPART 100 UNIT/ML ~~LOC~~ SOLN
3.0000 [IU] | Freq: Three times a day (TID) | SUBCUTANEOUS | Status: DC
Start: 2014-02-18 — End: 2014-02-18

## 2014-02-18 MED ORDER — INSULIN ASPART 100 UNIT/ML ~~LOC~~ SOLN
0.0000 [IU] | Freq: Three times a day (TID) | SUBCUTANEOUS | Status: DC
  Administered 2014-02-18: 20 [IU] via SUBCUTANEOUS
  Administered 2014-02-18: 5 [IU] via SUBCUTANEOUS
  Administered 2014-02-18: 3 [IU] via SUBCUTANEOUS

## 2014-02-18 MED ORDER — ALLOPURINOL 300 MG PO TABS: 300.0000 mg | ORAL_TABLET | Freq: Every day | ORAL | Status: DC

## 2014-02-18 MED ORDER — LISINOPRIL 2.5 MG PO TABS
2.5000 mg | ORAL_TABLET | Freq: Every day | ORAL | Status: DC
Start: 2014-02-18 — End: 2014-02-19
  Administered 2014-02-18 – 2014-02-19 (×2): 2.5 mg via ORAL
  Filled 2014-02-18 (×2): qty 1

## 2014-02-18 MED ORDER — ROSUVASTATIN CALCIUM 10 MG PO TABS
10.0000 mg | ORAL_TABLET | Freq: Every day | ORAL | Status: DC
  Administered 2014-02-18: 10 mg via ORAL
  Filled 2014-02-18 (×2): qty 1

## 2014-02-18 MED ORDER — INSULIN ASPART 100 UNIT/ML ~~LOC~~ SOLN
3.0000 [IU] | Freq: Three times a day (TID) | SUBCUTANEOUS | Status: DC
  Administered 2014-02-18 – 2014-02-19 (×3): 3 [IU] via SUBCUTANEOUS

## 2014-02-18 MED ORDER — INSULIN ASPART 100 UNIT/ML ~~LOC~~ SOLN
20.0000 [IU] | Freq: Once | SUBCUTANEOUS | Status: AC
  Administered 2014-02-18: 20 [IU] via SUBCUTANEOUS

## 2014-02-18 MED ORDER — INSULIN DETEMIR 100 UNIT/ML ~~LOC~~ SOLN
20.0000 [IU] | Freq: Two times a day (BID) | SUBCUTANEOUS | Status: DC
  Administered 2014-02-18 (×2): 20 [IU] via SUBCUTANEOUS
  Filled 2014-02-18 (×3): qty 0.2

## 2014-02-18 MED ORDER — POLYETHYLENE GLYCOL 3350 17 G PO PACK
17.0000 g | PACK | Freq: Two times a day (BID) | ORAL | Status: DC
  Administered 2014-02-19: 17 g via ORAL
  Filled 2014-02-18 (×2): qty 1

## 2014-02-18 MED ORDER — DOCUSATE SODIUM 100 MG PO CAPS
200.0000 mg | ORAL_CAPSULE | Freq: Two times a day (BID) | ORAL | Status: DC
  Administered 2014-02-18 – 2014-02-19 (×3): 200 mg via ORAL
  Filled 2014-02-18 (×4): qty 2

## 2014-02-18 NOTE — Progress Notes (Signed)
PROGRESS NOTE    Carol [name] YHC:[id number] DOB: 1936-04-16 DOA: 02/15/2014 PCP: Lillard Anes, MD  HPI/Brief narrative  78 year old female with history of DM 2, HTN, HLD, ischemic cardiomyopathy with recent NSTEMI in the first week of August 2015, experienced some jaw pain on 02/10/14 and went to the cardiologist's office on 02/15/14 for Myoview stress test and lab work. However her blood glucose was 783, she was complaining of DOE and intermittent jaw discomfort. She claimed compliance to her medications including diabetic medications. She however stopped taking Lipitor secondary to severe muscle pains in her legs with difficulty ambulating. She was sent to the hospital for admission. In the ED, blood glucose 779, bicarbonate 18, anion gap 18 and serum creatinine 1.34. She was admitted to step down on an insulin drip which has since been transitioned to maintenance dose insulins and SSI. Cardiology consulted on 8/25 for evaluation of jaw pain/ischemia.    Assessment/Plan:    1. Hyperosmolar nonketotic state secondary to poorly controlled DM 2: Admitted to step down. Treated with IV insulin, IV fluids and transitioned to Levemir BID and SSI on with pre-meal NovoLog. Last A1c on 01/22/2014 was 9.1. Glycemic control is erratic and she received one to milligrams of IV Solu-Medrol prior to left heart cath on 02/17/2014 evening, have adjusted his insulin dosage, continue to monitor CBGs closely she has been seen by diabetic coordinator, .   2. Jaw pain/? Angina equivalent/recent NSTEMI/Isch CM/nonobstructive CAD s/p PTCA of OM2: Patient denies any jaw pain at this time. Troponins x4 negative. Cardiology consulted had a stress test along with L heart cath needing DES x 2. Ef now 60%.  Continue carvedilol, aspirin, Brilinta & Imdur. Allergic to statins.   3. Chronic systolic CHF now Ef 51% on L Heart Cath: Compensated. Continue Lasix 20 mg daily.    4. Hyperlipidemia: Intolerant of  Zocor in past and Lipitor now-lower extremity muscle aches. DC Lipitor. May consider alternate group > defer to cards.    5. Hypothyroid: Continue Armour Thyroid. TSH 0.564   6. Stage III chronic kidney disease. Baseline creatinine 1.1-1.3. Stable.   7. Hypertension: Reasonably controlled. As per cardiology office notes, issues with orthostatic as outpatient. Monitor closely and if still has issues with same, may need to cut back on medications.   8 . Mild gout flare in the right foot. Placed on colchicine monitor.     Code Status: Full Family Communication: Discussed with patient's daughter at bedside. Disposition Plan: Move to telemetry bed post stress test   Consultants:  Cardiology  Procedures:  Nuclear stress test, left heart cath  Antibiotics:  None   DVT prophylaxis Lovenox.     Subjective:  Denies jaw pain or leg cramps. No chest pain or dyspnea. Denies dysuria, fever or chills.   Objective: Filed Vitals:   02/18/14 0418 02/18/14 0500 02/18/14 0720 02/18/14 1111  BP: 127/68  145/67 159/62  Pulse: 76  74 78  Temp: 97.4 F (36.3 C)  97.6 F (36.4 C) 97.4 F (36.3 C)  TempSrc: Oral  Oral Oral  Resp: 16  18 20   Height:      Weight:  84.6 kg (186 lb 8.2 oz)    SpO2: 98%  99% 98%    Intake/Output Summary (Last 24 hours) at 02/18/14 1201 Last data filed at 02/18/14 0600  Gross per 24 hour  Intake 1012.65 ml  Output   3000 ml  Net -1987.35 ml   Filed Weights   02/15/14 2100 02/17/14 0329  02/18/14 0500  Weight: 83.6 kg (184 lb 4.9 oz) 83.6 kg (184 lb 4.9 oz) 84.6 kg (186 lb 8.2 oz)     Exam:  General exam: Pleasant elderly female sitting up comfortably in bed. Respiratory system: Clear. No increased work of breathing. Cardiovascular system: S1 & S2 heard, RRR. No JVD, murmurs, gallops, clicks or pedal edema. Gastrointestinal system: Abdomen is nondistended, soft and nontender. Normal bowel sounds heard. Central nervous system: Alert and  oriented. No focal neurological deficits. Extremities: Symmetric 5 x 5 power.   Data Reviewed: Basic Metabolic Panel:  Recent Labs Lab 02/15/14 2150 02/16/14 0418 02/17/14 0218 02/17/14 0805 02/18/14 0550  NA 138 134* 137 139 139  K 4.5 4.5 3.9 4.4 4.6  CL 102 102 103 105 102  CO2 21 19 18* 19 17*  GLUCOSE 197* 348* 148* 196* 314*  BUN 46* 45* 39* 38* 33*  CREATININE 1.16* 1.13* 1.25* 1.12* 0.99  CALCIUM 9.6 9.4 9.6 9.4 10.2   Liver Function Tests: No results found for this basename: AST, ALT, ALKPHOS, BILITOT, PROT, ALBUMIN,  in the last 168 hours No results found for this basename: LIPASE, AMYLASE,  in the last 168 hours No results found for this basename: AMMONIA,  in the last 168 hours CBC:  Recent Labs Lab 02/15/14 1212 02/18/14 0550  WBC 5.5 8.7  NEUTROABS 3.3  --   HGB 12.1 12.9  HCT 37.2 38.6  MCV 84.5 85.2  PLT 206 157   Cardiac Enzymes:  Recent Labs Lab 02/15/14 0934 02/15/14 1212 02/15/14 1800 02/16/14  TROPONINI <0.30 <0.30 <0.30 <0.30   BNP (last 3 results)  Recent Labs  01/22/14 1104  PROBNP 202.7   CBG:  Recent Labs Lab 02/17/14 1843 02/17/14 2110 02/17/14 2354 02/18/14 0725 02/18/14 1116  GLUCAP 226* 428* 397* 267* 505*    Recent Results (from the past 240 hour(s))  MRSA PCR SCREENING     Status: None   Collection Time    02/15/14  8:15 PM      Result Value Ref Range Status   MRSA by PCR NEGATIVE  NEGATIVE Final   Comment:            The GeneXpert MRSA Assay (FDA     approved for NASAL specimens     only), is one component of a     comprehensive MRSA colonization     surveillance program. It is not     intended to diagnose MRSA     infection nor to guide or     monitor treatment for     MRSA infections.       Studies:\  Nm Myocar Multi W/spect W/wall Motion / Ef  02/17/2014   CLINICAL DATA:  Chest pain. Diaphoresis. History of coronary artery disease.  EXAM: MYOCARDIAL IMAGING WITH SPECT (REST AND  PHARMACOLOGIC-STRESS)  GATED LEFT VENTRICULAR WALL MOTION STUDY  LEFT VENTRICULAR EJECTION FRACTION  TECHNIQUE: Standard myocardial SPECT imaging was performed after resting intravenous injection of 10 mCi [id number]. Subsequently, intravenous infusion of Lexiscan was performed under the supervision of the Cardiology staff. At peak effect of the drug, 30 mCi [id number] was injected intravenously and standard myocardial SPECT imaging was performed. Quantitative gated imaging was also performed to evaluate left ventricular wall motion, and estimate left ventricular ejection fraction.  COMPARISON:  Myocardial perfusion study 12/25/2012. CT 01/22/2014. Echocardiogram 01/23/2014 and 12/17/2012.  FINDINGS: Perfusion: Fixed area decreased perfusion is present in the LEFT lateral wall. This severe area of decreased perfusion  is moderate-sized in the mid lateral wall. Mild hypokinesia of the lateral wall. Compared to prior study of 12/24/2012, this defect is new and compatible with interval infarct. There is a small area of decreased perfusion on stress images in the inferior near the apex anterior wall on stress images which appears normal on rest images.  Wall Motion: Mild LEFT lateral wall hypokinesia.  Left Ventricular Ejection Fraction: 63 %  End diastolic volume 41 mL ml  End systolic volume 15 mL ml  IMPRESSION: 1. New LEFT lateral wall infarction. Small area of mildly decreased perfusion in the inferior anterior wall near the apex.  2. Mild hypokinesia associated with LEFT lateral wall infarction.  3. Left ventricular ejection fraction 63%. This is discrepant when comparing to echocardiogram may 01/23/2014 which reported ejection fraction of 25-30%. This is similar to the ejection fraction reported on prior echocardiogram 12/17/2012.  4. Intermediate-risk stress test findings*.  *2012 Appropriate Use Criteria for Coronary Revascularization Focused Update: J Am Coll Cardiol. 3818;29(9):371-696.  http://content.airportbarriers.com.aspx?articleid=[email or web address]   Electronically Signed   By: Dereck Ligas M.D.   On: 02/17/2014 12:12     L heart Cath  PCI Note: She was given an additional 6000 Units IV heparin for a total of 10,000 units. The left main was engaged with a XB 3.0 guiding catheter. When the ACT was over 200, I passed a Cougar wire down the Circumflex artery into the intermediate branch and left it there for support. I then passed a second Cougar wire down the vessel into the OM2 branch. A 2.0 x 8 mm balloon was used to pre-dilate the stenosis. A 2.25 x 12 mm Xience DES was deployed in the OM2. It was very difficult to visualize the entire lesion as the vessel was straightened by the wire. There was a slight haziness on the front edge of the stent, likely due to incomplete coverage of the stenosis. I then deployed a 2.25 x 8 mm Xience DES overlapping the first stent proximally. The stents were post-dilated with a 2.25 x 12 mm [place] balloon x 2. The stenosis was taken from 99% down to 0%.  The sheath was removed from the right radial artery and a Terumo hemostasis band was applied at the arteriotomy site on the right wrist. There were no immediate complications. The patient was taken to the recovery area in stable condition.  Hemodynamic Findings:  Central aortic pressure: 128/66  Left ventricular pressure: 129/4/10  Angiographic Findings:  Left main: 20% mid stenosis.  Left Anterior Descending Artery: Large caliber vessel that courses to the apex. The proximal vessel has a 40-50% stenosis. The mid vessel has a focal 30% stenosis. The diagonal branch is small caliber with diffuse mild plaque.  Circumflex Artery: Moderate caliber vessel with a very small caliber intermediate branch, a very small caliber first OM branch and a moderate calliber second obtuse marginal branch. The proximal Circumflex has a focal 30% stenosis. The intermediate branch is very small in caliber with proximal 50%  stenosis. The second obtuse marginal branch has a focal 99% stenosis. The mid AV groove Circumflex has a 40% stenosis beyond the takeoff of the second OM branch.  Right Coronary Artery: Large caliber dominant vessel with small caliber PDA. The proximal vessel has a 30% stenosis. The mid vessel has diffuse 40% stenosis. The distal vessel tapers to a small caliber vessel. The PDA is a 1.5 mm vessel. There is a 90% stenosis in the mid PDA, unchanged from last cath.  Left Ventricular Angiogram:  LVEF=60-65%.  Impression:  1. Severe single vessel CAD  2. Severe stenosis OM2  3. Normal LV function  4. Successful PTCA/DES x 2 OM2  5. Severe stenosis very small caliber PDA  Recommendations: Medical management including ASA, Brilinta, statin, beta blocker, Imdur.  Complications: None. The patient tolerated the procedure well.       Scheduled Meds: . [START ON 02/20/2014] allopurinol  300 mg Oral Daily  . aspirin EC  81 mg Oral Daily  . carvedilol  6.25 mg Oral BID WC  . colchicine  0.6 mg Oral BID  . furosemide  20 mg Oral Daily  . insulin aspart  0-5 Units Subcutaneous QHS  . insulin aspart  0-9 Units Subcutaneous TID WC  . insulin aspart  3 Units Subcutaneous TID WC  . insulin detemir  20 Units Subcutaneous BID  . insulin starter kit- pen needles  1 kit Other Once  . isosorbide mononitrate  30 mg Oral Daily  . lisinopril  2.5 mg Oral Daily  . pantoprazole  40 mg Oral Daily  . rosuvastatin  10 mg Oral QHS  . thyroid  120 mg Oral Daily  . ticagrelor  90 mg Oral BID   Continuous Infusions:    Principal Problem:   Post-infarction angina Active Problems:   Type II diabetes mellitus with complication, uncontrolled   Unspecified essential hypertension   Unspecified hypothyroidism   Hyperlipidemia with target LDL less than 70   Hyperosmolar non-ketotic state in patient with type 2 diabetes mellitus   Ischemic cardiomyopathy   Chronic systolic CHF (congestive heart failure)   CKD stage 3  due to type 2 diabetes mellitus   Abnormal nuclear stress test: Intermediate risk   Presence of drug coated stent in left circumflex coronary artery: OM2 - Xience [place] DES 2.25 mm x 12, 2.25 mm x 8 mm overlap    Time spent: 45 minutes.    Thurnell Lose, MD, FACP, FHM. [facility] Pager [phone]  If 7PM-7AM, please contact night-coverage [email or web address] Password [email or web address] 02/18/2014, 12:01 PM    LOS: 3 days

## 2014-02-18 NOTE — Progress Notes (Signed)
CBG was checked for 1200. Pt's CBG was 505. Dr. Candiss Norse was paged. New orders received to give 20 units on novolog now and to recheck her CBG in 2 hours. Insulin given and will continue to monitor.

## 2014-02-18 NOTE — Progress Notes (Signed)
CARDIAC REHAB PHASE I   PRE:  Rate/Rhythm: 91 SR  BP:  Supine:   Sitting: 122/65  Standing:    SaO2:   MODE:  Ambulation: 700 ft   POST:  Rate/Rhythm: 101 ST  BP:  Supine:   Sitting: 159/62  Standing:    SaO2:  1020-1115 Pt walked 700 ft on RA with rolling walker with steady gait. Used rolling walker due to gout but do not think pt needed it. Tolerated well without CP. Brief review of ed done since we just saw pt few weeks ago. Pt knew how to take NTG correctly and discussed carb counting. Gave diabetic diet handout. Pt stated she had been taking her diabetic med and following carb counting but that she could not get blood for checking glucose few days before she came in so did not know what sugars were at that time. Pt had been to orientation at [facility] for CRP 2. Will send them an update letter since pt readmitted. Gave stent booklet. Gave ex ed so pt knows how to restart activity at home.   Graylon Good, RN BSN  02/18/2014 11:09 AM

## 2014-02-18 NOTE — Progress Notes (Addendum)
Patient: Carol [name] / Admit Date: 02/15/2014 / Date of Encounter: 02/18/2014, 7:38 AM  Subjective: Feels OK this AM.  Only complaint is L foot gout & that she felt crampy after taking Crestor  Objective: Telemetry: NSR no significant heart block or arrhythmias, rare PVC Physical Exam: Blood pressure 145/67, pulse 74, temperature 97.6 F (36.4 C), temperature source Oral, resp. rate 18, height 5' 7.5" (1.715 m), weight 186 lb 8.2 oz (84.6 kg), SpO2 99.00%. General: Well developed, well nourished WF, in no acute distress. Head: Normocephalic, atraumatic, sclera non-icteric, no xanthomas, nares are without discharge. Neck: JVP not elevated. Lungs: Clear bilaterally to auscultation without wheezes, rales, or rhonchi. Breathing is unlabored. Heart: RRR S1 S2 without murmurs, rubs, or gallops.  Abdomen: Soft, non-tender, non-distended with normoactive bowel sounds. No rebound/guarding. Extremities: No clubbing or cyanosis. No edema. Distal pedal pulses are 2+ and equal bilaterally. L wrist has moderate bruising. Neuro: Alert and oriented X 3. Moves all extremities spontaneously. Psych:  Responds to questions appropriately with a normal affect.   Intake/Output Summary (Last 24 hours) at 02/18/14 0738 Last data filed at 02/18/14 0600  Gross per 24 hour  Intake 1012.65 ml  Output   3300 ml  Net -2287.35 ml    Inpatient Medications:  . allopurinol  300 mg Oral Daily  . aspirin EC  81 mg Oral Daily  . carvedilol  6.25 mg Oral BID WC  . furosemide  20 mg Oral Daily  . insulin aspart  0-5 Units Subcutaneous QHS  . insulin aspart  0-9 Units Subcutaneous TID WC  . insulin aspart  3 Units Subcutaneous TID WC  . insulin detemir  20 Units Subcutaneous BID  . insulin starter kit- pen needles  1 kit Other Once  . isosorbide mononitrate  30 mg Oral Daily  . pantoprazole  40 mg Oral Daily  . rosuvastatin  20 mg Oral QHS  . thyroid  120 mg Oral Daily  . ticagrelor  90 mg Oral BID    Infusions:    Labs:  Recent Labs  02/17/14 0805 02/18/14 0550  NA 139 139  K 4.4 4.6  CL 105 102  CO2 19 17*  GLUCOSE 196* 314*  BUN 38* 33*  CREATININE 1.12* 0.99  CALCIUM 9.4 10.2    Recent Labs  02/15/14 1212 02/18/14 0550  WBC 5.5 8.7  NEUTROABS 3.3  --   HGB 12.1 12.9  HCT 37.2 38.6  MCV 84.5 85.2  PLT 206 157    Recent Labs  02/15/14 0934 02/15/14 1212 02/15/14 1800 02/16/14  TROPONINI <0.30 <0.30 <0.30 <0.30    Radiology/Studies: No new Cardiac Cath: 02/17/14, L Radial  LM - 20%  LAD: prox 40-50%, mid ~30%;  Small Diag  LCx: Mod caliber, small RI & OM1; moderate OM2.  pCx ~30%, RI 50%; OM2 focal 99% (@ previous POBA site)   PCI with 2 overlapping Xience Alpine DES 2.25 mm x12 mm & 2.25 mm x 8 mm  RCA: large,dominant, small PDA. pRCA ~30, mRCA 40%; rPDA (~1.5 mm) ~90% & stable  EF ~60-65%   Assessment and Plan  Principal Problem:   Post-infarction angina Active Problems:   Type II diabetes mellitus with complication, uncontrolled   Unspecified essential hypertension   Hyperosmolar non-ketotic state in patient with type 2 diabetes mellitus   Abnormal nuclear stress test: Intermediate risk   Presence of drug coated stent in left circumflex coronary artery: OM2 - Xience Alpine DES 2.25 mm x 12, 2.25  mm x 8 mm overlap   Unspecified hypothyroidism   Hyperlipidemia with target LDL less than 70   Ischemic cardiomyopathy   Chronic systolic CHF (congestive heart failure)   CKD stage 3 due to type 2 diabetes mellitus  Severe restenosis of OM2 noted @ POBA site -- now Rx with 2.25 mm DES.  Also V Gram suggest Normal EF (as suggested by Myoview yesterday. She seems to be "autodiuresing" - partly related to hyperglycemia, but also with improved EF & renal Fnx is much better.   With her recent complaints of weakness, would not further titrate BB dose (although BP room available);   If she is not d/c'd today, would consider ACE-I/ARB @ low dose  prior to d/c (Lisinopril ~2.5 mg)  With rPDA disease, continuing Imdur is not unreasonable for antianginal effect.  On ASA & Brilinta for DES  Had noted weakness as OP & not taking Crestor @ home.  Will try to continue in light of PCI for endothelial stabilization but @ lower dose.  Glycemic control is better, but sugars still high.  I strongly suspect that she will need at least QHS basal Insulin on d/c -- appreciate [facility] taking the lead on this, No longer in [facility].  Anticipate at least transfer to [facility] today if bed available, if not d/c if OK with [facility].  Is OK for d/c post CATH  Leonie Man, M.D., M.S. Interventional Cardiologist   Pager # [phone] 02/18/2014

## 2014-02-19 DIAGNOSIS — I10 Essential (primary) hypertension: Secondary | ICD-10-CM

## 2014-02-19 LAB — GLUCOSE, CAPILLARY
GLUCOSE-CAPILLARY: 302 mg/dL — AB (ref 70–99)
GLUCOSE-CAPILLARY: 227 mg/dL — AB (ref 70–99)

## 2014-02-19 MED ORDER — INSULIN ASPART 100 UNIT/ML ~~LOC~~ SOLN
0.0000 [IU] | Freq: Three times a day (TID) | SUBCUTANEOUS | Status: DC
  Administered 2014-02-19: 15 [IU] via SUBCUTANEOUS
  Administered 2014-02-19: 7 [IU] via SUBCUTANEOUS

## 2014-02-19 MED ORDER — INSULIN DETEMIR 100 UNIT/ML ~~LOC~~ SOLN
25.0000 [IU] | Freq: Two times a day (BID) | SUBCUTANEOUS | Status: DC
  Administered 2014-02-19: 25 [IU] via SUBCUTANEOUS
  Filled 2014-02-19 (×2): qty 0.25

## 2014-02-19 MED ORDER — INSULIN ASPART 100 UNIT/ML ~~LOC~~ SOLN: 0.0000 [IU] | Freq: Every day | SUBCUTANEOUS | Status: DC

## 2014-02-19 MED ORDER — INSULIN DETEMIR 100 UNIT/ML ~~LOC~~ SOLN: 25.0000 [IU] | Freq: Two times a day (BID) | SUBCUTANEOUS | Status: DC

## 2014-02-19 MED ORDER — COLCHICINE 0.6 MG PO TABS: 0.6000 mg | ORAL_TABLET | Freq: Two times a day (BID) | ORAL | Status: DC

## 2014-02-19 MED ORDER — LISINOPRIL 2.5 MG PO TABS: 2.5000 mg | ORAL_TABLET | Freq: Every day | ORAL | Status: DC

## 2014-02-19 MED ORDER — "INSULIN SYRINGE-NEEDLE U-100 25G X 1"" 1 ML MISC": Status: DC

## 2014-02-19 MED ORDER — INSULIN ASPART 100 UNIT/ML ~~LOC~~ SOLN: SUBCUTANEOUS | Status: DC

## 2014-02-19 NOTE — Progress Notes (Signed)
CARDIAC REHAB PHASE I   PRE:  Rate/Rhythm: 75 SR  BP:  Supine:   Sitting: 111/44  Standing:    SaO2:   MODE:  Ambulation: 700 ft   POST:  Rate/Rhythm: 105 ST  BP:  Supine:   Sitting: 111/55  Standing:    SaO2:  1015-1040 Pt walked 700 ft with steady gait even with the gout. Tolerated well. No CP. Ready to go home.   Graylon Good, RN BSN  02/19/2014 10:33 AM

## 2014-02-19 NOTE — Progress Notes (Signed)
Claimed that someone came to teach her the other day on how to give insulin injection to herself and feels comfortable with it. Follow-up education given , very cooperative.

## 2014-02-19 NOTE — Discharge Summary (Signed)
Carol [name], is a 78 y.o. female  DOB Dec 13, 1935  MRN [id number].  Admission date:  02/15/2014  Admitting Physician  Orson Eva, MD  Discharge Date:  02/19/2014   Primary MD  Lillard Anes, MD  Recommendations for primary care physician for things to follow:   Monitor CBGs and glycemic control closely A1c was 9 this admission, monitor secondary risk factors for CAD.   Admission Diagnosis  Ischemic cardiomyopathy [414.8] Hyperglycemia 0000000 Chronic systolic HF (heart failure) [428.22] Hyperosmolar non-ketotic state in patient with type 2 diabetes mellitus [250.20] CKD stage 3 due to type 2 diabetes mellitus [250.40, 585.3]   Discharge Diagnosis  Ischemic cardiomyopathy [414.8] Hyperglycemia 0000000 Chronic systolic HF (heart failure) [428.22] Hyperosmolar non-ketotic state in patient with type 2 diabetes mellitus [250.20] CKD stage 3 due to type 2 diabetes mellitus [250.40, 585.3]    Principal Problem:   Post-infarction angina Active Problems:   Type II diabetes mellitus with complication, uncontrolled   Unspecified essential hypertension   Unspecified hypothyroidism   Hyperlipidemia with target LDL less than 70   Hyperosmolar non-ketotic state in patient with type 2 diabetes mellitus   Ischemic cardiomyopathy   Chronic systolic CHF (congestive heart failure)   CKD stage 3 due to type 2 diabetes mellitus   Abnormal nuclear stress test: Intermediate risk   Presence of drug coated stent in left circumflex coronary artery: OM2 - Xience Alpine DES 2.25 mm x 12, 2.25 mm x 8 mm overlap      Past Medical History  Diagnosis Date  . Hypertension   . Asthma   . Hyperlipidemia   . Gout, unspecified   . CAD S/P percutaneous coronary angioplasty 01/22/2014    a) (12/2013): POBA - OM2 99% (very tortuous segment) -  reduced ~ 50% & TIMI 3 flow, 80-90% stenosis in mid PDA, Irregularities <50% in mRCA, pLAD and prox LCx;; b) 02/17/14: Moderate sized fixed defect in Lateral wall --> cath with OM2 restenosis & otherwise stable --> Xience Alpine DES x 2 (2.25 mm x 12 & 8 mm)  . Chronic systolic heart failure     a. ICM b. ECHO (01/2014): EF 25-30%, grade I DD, trivial MR; b) EF by Myoview 02/17/14 = ~53%  . NSTEMI (non-ST elevated myocardial infarction) 2014; 12/2013  . Environmental allergies   . Pneumonia     "2-3 times" (01/25/2014)  . Hyperthyroidism   . Type II diabetes mellitus   . GERD (gastroesophageal reflux disease)   . Stroke 1990's    "mild", denies residual    Past Surgical History  Procedure Laterality Date  . Shoulder open rotator cuff repair Bilateral 1980's - 1990's  . Total knee arthroplasty Right 2009  . Cesarean section  [id number]; 1960  . Abdominal hysterectomy    . Appendectomy  1959  . Cholecystectomy  1989  . Joint replacement    . Reduction mammaplasty Bilateral 1990's  . Cataract extraction w/ intraocular lens  implant, bilateral Bilateral 2013  . Coronary angioplasty  12/2013    POBA -  OM2  . Percutaneous coronary stent intervention (pci-s)  02/17/14    For restenosis of OM2 - PCI with Xience Apline DES 2.25 mm x 12 mm & 2.25 mm x 8 mm overlapping       History of present illness and  Hospital Course:     Kindly see H&P for history of present illness and admission details, please review complete Labs, Consult reports and Test reports for all details in brief  HPI  from the history and physical done on the day of admission   Carol [name] is a 78 y.o. female with prior h/o CAD, s/p NSTEMI with PCI, DM, comes in for fatigue , generalized weakness and saw her cardiologist in the office today and was sent to ED for for further evaluation . On arrival to ED, she was found to have elevated cbg's in 400's with AG of 21. Patient was given 10 units of novo log by the time i saw her.  She also found to be dehydrated and has orthostatic hypotension. She also reports dizziness on walking, but no episodes of syncope. UA revealed mild UTI, and she reports symptoms of dysuria last few days. She denies any other complaints. She is referred to [facility] for admission for hydration, uncontrolled DM and UTI.    Hospital Course   1. Hyperosmolar nonketotic state secondary to poorly controlled DM 2: Admitted to step down. Treated with IV insulin, IV fluids and transitioned to Levemir BID and SSI , she was taking Glucotrol and in work on outpatient in her A1c was 9.1, will be discharged on Levemir along with sliding scale insulin with meals, testing supplies provided requested to check CBGs q. a.c. at bedtime and to show the logbook to PCP next visit to monitor and adjust insulin dose. She does not wish to be on oral medications at this time.   2. Jaw pain/? Angina equivalent/recent NSTEMI/Isch CM/nonobstructive CAD s/p PTCA of OM2: Patient denies any jaw pain at this time. Troponins x4 negative. Cardiology consulted had a stress test along with L heart cath needing DES x 2. Ef now 60%. Continue carvedilol, aspirin, Brilinta, statin and Imdur.   3. Chronic systolic CHF now Ef [id number] on L Heart Cath: Compensated. Continue Lasix 20 mg daily along with beta blocker and Imdur.   4. Hyperlipidemia: Intolerant of Zocor in past and Lipitor now.    5. Hypothyroid: Continue Armour Thyroid. TSH 0.564    6. Stage III chronic kidney disease. Baseline creatinine 1.1-1.3. Stable.    7. Hypertension: Reasonably controlled on present regimen which includes Coreg, Imdur, lisinopril, request PCP to monitor blood pressure and adjust medications as needed.          8 . Mild gout flare in the right foot. Placed on colchicine monitor.        Discharge Condition: stable   Follow UP  Follow-up Information   Follow up with PERRY,LAWRENCE EDWARD, MD. Schedule an appointment as soon as possible for a  visit in 1 week.   Specialty:  Family Medicine   Contact information:   [address] [place] [address] [phone]       Follow up with MCALHANY,CHRISTOPHER, MD. Schedule an appointment as soon as possible for a visit in 1 week.   Specialty:  Cardiology   Contact information:   [place] [place] [address] [phone]         Discharge Instructions  and  Discharge Medications  Discharge Instructions   Discharge instructions    Complete by:  As directed   Follow with Primary MD PERRY,LAWRENCE EDWARD, MD in 7 days   Get CBC, CMP, 2 view Chest X ray checked  by Primary MD next visit.    Activity: As tolerated with Full fall precautions use walker/cane & assistance as needed   Disposition Home    Diet: Heart Healthy - Low Carb.  Accuchecks 4 times/day, Once in AM empty stomach and then before each meal. Log in all results and show them to your Prim.MD in 3 days. If any glucose reading is under 80 or above 300 call your Prim MD immidiately. Follow Low glucose instructions for glucose under 80 as instructed.   For Heart failure patients - Check your Weight same time everyday, if you gain over 2 pounds, or you develop in leg swelling, experience more shortness of breath or chest pain, call your Primary MD immediately. Follow Cardiac Low Salt Diet and 1.8 lit/day fluid restriction.   On your next visit with her primary care physician please Get Medicines reviewed and adjusted.  Please request your Prim.MD to go over all Hospital Tests and Procedure/Radiological results at the follow up, please get all Hospital records sent to your Prim MD by signing hospital release before you go home.   If you experience worsening of your admission symptoms, develop shortness of breath, life threatening emergency, suicidal or homicidal thoughts you must seek medical attention immediately by calling 911 or calling your MD immediately  if symptoms less  severe.  You Must read complete instructions/literature along with all the possible adverse reactions/side effects for all the Medicines you take and that have been prescribed to you. Take any new Medicines after you have completely understood and accpet all the possible adverse reactions/side effects.   Do not drive, operating heavy machinery, perform activities at heights, swimming or participation in water activities or provide baby sitting services if your were admitted for syncope or siezures until you have seen by Primary MD or a Neurologist and advised to do so again.  Do not drive when taking Pain medications.    Do not take more than prescribed Pain, Sleep and Anxiety Medications  Special Instructions: If you have smoked or chewed Tobacco  in the last 2 yrs please stop smoking, stop any regular Alcohol  and or any Recreational drug use.  Wear Seat belts while driving.   Please note  You were cared for by a hospitalist during your hospital stay. If you have any questions about your discharge medications or the care you received while you were in the hospital after you are discharged, you can call the unit and asked to speak with the hospitalist on call if the hospitalist that took care of you is not available. Once you are discharged, your primary care physician will handle any further medical issues. Please note that NO REFILLS for any discharge medications will be authorized once you are discharged, as it is imperative that you return to your primary care physician (or establish a relationship with a primary care physician if you do not have one) for your aftercare needs so that they can reassess your need for medications and monitor your lab values.     Increase activity slowly    Complete by:  As directed             Medication List    STOP taking these medications       Canagliflozin 300 MG  Tabs  Commonly known as:  INVOKANA     glipiZIDE 5 MG tablet  Commonly known as:   GLUCOTROL      TAKE these medications       albuterol 108 (90 BASE) MCG/ACT inhaler  Commonly known as:  PROVENTIL HFA;VENTOLIN HFA  Inhale 1 puff into the lungs every 6 (six) hours as needed for wheezing or shortness of breath.     allopurinol 300 MG tablet  Commonly known as:  ZYLOPRIM  Take 300 mg by mouth daily.     aspirin 81 MG EC tablet  Take 1 tablet (81 mg total) by mouth daily.     atorvastatin 80 MG tablet  Commonly known as:  LIPITOR  Take 1 tablet (80 mg total) by mouth daily at 6 PM.     Biotin 5000 MCG Caps  Take 1 capsule by mouth daily.     carvedilol 6.25 MG tablet  Commonly known as:  COREG  Take 1 tablet (6.25 mg total) by mouth 2 (two) times daily with a meal.     colchicine 0.6 MG tablet  Take 1 tablet (0.6 mg total) by mouth 2 (two) times daily.     insulin aspart 100 UNIT/ML injection  Commonly known as:  novoLOG  Before each meal 3 times a day , 140-199 - 4 units, 200-250 - 6 units, 251-299 - 8 units,  300-349 - 10 units,  350 or above 12 units.Insulin PEN if approved, provide syringes and needles if needed.     insulin detemir 100 UNIT/ML injection  Commonly known as:  LEVEMIR  Inject 0.25 mLs (25 Units total) into the skin 2 (two) times daily. Syringes and needles as needed     isosorbide mononitrate 30 MG 24 hr tablet  Commonly known as:  IMDUR  Take 1 tablet (30 mg total) by mouth daily.     lisinopril 2.5 MG tablet  Commonly known as:  PRINIVIL,ZESTRIL  Take 1 tablet (2.5 mg total) by mouth daily.     nitroGLYCERIN 0.4 MG SL tablet  Commonly known as:  NITROSTAT  Place 0.4 mg under the tongue every 5 (five) minutes as needed for chest pain.     omeprazole 20 MG capsule  Commonly known as:  PRILOSEC  Take 20 mg by mouth daily.     PROBIOTIC ACIDOPHILUS Tabs  Take 1 tablet by mouth daily.     thyroid 60 MG tablet  Commonly known as:  ARMOUR  Take 120 mg by mouth daily. Take two tabs once a day for 90 days (09/04/2012)      ticagrelor 90 MG Tabs tablet  Commonly known as:  BRILINTA  Take 1 tablet (90 mg total) by mouth 2 (two) times daily.          Diet and Activity recommendation: See Discharge Instructions above   Consults obtained - Cards,DM educator   Major procedures and Radiology Reports - PLEASE review detailed and final reports for all details, in brief -   L Heart Cath    PCI Note: She was given an additional 6000 Units IV heparin for a total of 10,000 units. The left main was engaged with a XB 3.0 guiding catheter. When the ACT was over 200, I passed a Cougar wire down the Circumflex artery into the intermediate branch and left it there for support. I then passed a second Cougar wire down the vessel into the OM2 branch. A 2.0 x 8 mm balloon was used to pre-dilate the stenosis.  A 2.25 x 12 mm Xience DES was deployed in the OM2. It was very difficult to visualize the entire lesion as the vessel was straightened by the wire. There was a slight haziness on the front edge of the stent, likely due to incomplete coverage of the stenosis. I then deployed a 2.25 x 8 mm Xience DES overlapping the first stent proximally. The stents were post-dilated with a 2.25 x 12 mm Aurora balloon x 2. The stenosis was taken from 99% down to 0%.  The sheath was removed from the right radial artery and a Terumo hemostasis band was applied at the arteriotomy site on the right wrist. There were no immediate complications. The patient was taken to the recovery area in stable condition.   Hemodynamic Findings:  Central aortic pressure: 128/66  Left ventricular pressure: 129/4/10  Angiographic Findings:  Left main: 20% mid stenosis.  Left Anterior Descending Artery: Large caliber vessel that courses to the apex. The proximal vessel has a 40-50% stenosis. The mid vessel has a focal 30% stenosis. The diagonal branch is small caliber with diffuse mild plaque.  Circumflex Artery: Moderate caliber vessel with a very small caliber  intermediate branch, a very small caliber first OM branch and a moderate calliber second obtuse marginal branch. The proximal Circumflex has a focal 30% stenosis. The intermediate branch is very small in caliber with proximal 50% stenosis. The second obtuse marginal branch has a focal 99% stenosis. The mid AV groove Circumflex has a 40% stenosis beyond the takeoff of the second OM branch.  Right Coronary Artery: Large caliber dominant vessel with small caliber PDA. The proximal vessel has a 30% stenosis. The mid vessel has diffuse 40% stenosis. The distal vessel tapers to a small caliber vessel. The PDA is a 1.5 mm vessel. There is a 90% stenosis in the mid PDA, unchanged from last cath.  Left Ventricular Angiogram: LVEF=60-65%.  Impression:  1. Severe single vessel CAD  2. Severe stenosis OM2  3. Normal LV function  4. Successful PTCA/DES x 2 OM2  5. Severe stenosis very small caliber PDA   Recommendations: Medical management including ASA, Brilinta, statin, beta blocker, Imdur.   Complications: None. The patient tolerated the procedure well.     Ct Chest Wo Contrast  01/22/2014   CLINICAL DATA:  Intermittent chest pain for 1 month worse last night described is squeezing sharp pain associated was shortness of breath, lightheadedness and nausea, diaphoresis, history hypertension, diabetes, gout, asthma  EXAM: CT CHEST WITHOUT CONTRAST  TECHNIQUE: Multidetector CT imaging of the chest was performed following the standard protocol without IV contrast. Sagittal and coronal MPR images reconstructed from axial data set. IV contrast not administered due to history of contrast allergy.  COMPARISON:  05/09/2009 CT chest, chest radiograph 01/22/2014  FINDINGS: Scattered atherosclerotic calcifications aorta and coronary arteries.  No thoracic adenopathy.  Moderate-sized hiatal hernia.  Calcified granulomata within spleen.  Remaining visualized portions of upper abdomen unremarkable.  Suspect tiny LEFT lower  lobe tiny calcified granuloma image 23 present on previous exam as well.  Lungs clear.  No infiltrate, pleural effusion or pneumothorax.  Bones unremarkable.  IMPRESSION: Old granulomatous disease.  No acute intra thoracic abnormalities.   Electronically Signed   By: Lavonia Dana M.D.   On: 01/22/2014 13:12   Nm Myocar Multi W/spect W/wall Motion / Ef  02/17/2014   CLINICAL DATA:  Chest pain. Diaphoresis. History of coronary artery disease.  EXAM: MYOCARDIAL IMAGING WITH SPECT (REST AND PHARMACOLOGIC-STRESS)  GATED LEFT  VENTRICULAR WALL MOTION STUDY  LEFT VENTRICULAR EJECTION FRACTION  TECHNIQUE: Standard myocardial SPECT imaging was performed after resting intravenous injection of 10 mCi Tc-81m sestamibi. Subsequently, intravenous infusion of Lexiscan was performed under the supervision of the Cardiology staff. At peak effect of the drug, 30 mCi Tc-63m sestamibi was injected intravenously and standard myocardial SPECT imaging was performed. Quantitative gated imaging was also performed to evaluate left ventricular wall motion, and estimate left ventricular ejection fraction.  COMPARISON:  Myocardial perfusion study 12/25/2012. CT 01/22/2014. Echocardiogram 01/23/2014 and 12/17/2012.  FINDINGS: Perfusion: Fixed area decreased perfusion is present in the LEFT lateral wall. This severe area of decreased perfusion is moderate-sized in the mid lateral wall. Mild hypokinesia of the lateral wall. Compared to prior study of 12/24/2012, this defect is new and compatible with interval infarct. There is a small area of decreased perfusion on stress images in the inferior near the apex anterior wall on stress images which appears normal on rest images.  Wall Motion: Mild LEFT lateral wall hypokinesia.  Left Ventricular Ejection Fraction: 63 %  End diastolic volume 41 mL ml  End systolic volume 15 mL ml  IMPRESSION: 1. New LEFT lateral wall infarction. Small area of mildly decreased perfusion in the inferior anterior wall  near the apex.  2. Mild hypokinesia associated with LEFT lateral wall infarction.  3. Left ventricular ejection fraction 63%. This is discrepant when comparing to echocardiogram may 01/23/2014 which reported ejection fraction of 25-30%. This is similar to the ejection fraction reported on prior echocardiogram 12/17/2012.  4. Intermediate-risk stress test findings*.  *2012 Appropriate Use Criteria for Coronary Revascularization Focused Update: J Am Coll Cardiol. [id number]. http://content.airportbarriers.com.aspx?articleid=1201161   Electronically Signed   By: Dereck Ligas M.D.   On: 02/17/2014 12:12   Dg Chest Port 1 View  01/22/2014   CLINICAL DATA:  Chest pain and shortness of breath.  EXAM: PORTABLE CHEST - 1 VIEW  COMPARISON:  12/18/2010  FINDINGS: Lungs are adequately inflated without consolidation or effusion. Cardiomediastinal silhouette and remainder of the exam is unchanged.  IMPRESSION: No active disease.   Electronically Signed   By: Marin Olp M.D.   On: 01/22/2014 11:16    Micro Results      Recent Results (from the past 240 hour(s))  MRSA PCR SCREENING     Status: None   Collection Time    02/15/14  8:15 PM      Result Value Ref Range Status   MRSA by PCR NEGATIVE  NEGATIVE Final   Comment:            The GeneXpert MRSA Assay (FDA     approved for NASAL specimens     only), is one component of a     comprehensive MRSA colonization     surveillance program. It is not     intended to diagnose MRSA     infection nor to guide or     monitor treatment for     MRSA infections.       Today   Subjective:   Carol [name] today has no headache,no chest abdominal pain,no new weakness tingling or numbness, feels much better wants to go home today.   Objective:   Blood pressure 107/63, pulse 72, temperature 97.3 F (36.3 C), temperature source Axillary, resp. rate 16, height 5' 7.5" (1.715 m), weight 84.6 kg (186 lb 8.2 oz), SpO2 100.00%.   Intake/Output  Summary (Last 24 hours) at 02/19/14 0913 Last data filed at 02/18/14 1600  Gross  per 24 hour  Intake      0 ml  Output   1000 ml  Net  -1000 ml    Exam Awake Alert, Oriented x 3, No new F.N deficits, Normal affect Carol [name],PERRAL Supple Neck,No JVD, No cervical lymphadenopathy appriciated.  Symmetrical Chest wall movement, Good air movement bilaterally, CTAB RRR,No Gallops,Rubs or new Murmurs, No Parasternal Heave +ve B.Sounds, Abd Soft, Non tender, No organomegaly appriciated, No rebound -guarding or rigidity. No Cyanosis, Clubbing or edema, No new Rash or bruise slept small bruise on the left wrist at the cath site.  Data Review   Lab Results  Component Value Date   HGBA1C 9.1* 01/22/2014    Lab Results  Component Value Date   CHOL 151 01/23/2014   HDL 35* 01/23/2014   LDLCALC 88 01/23/2014   TRIG 142 01/23/2014   CHOLHDL 4.3 01/23/2014     CBC w Diff: Lab Results  Component Value Date   WBC 8.7 02/18/2014   HGB 12.9 02/18/2014   HCT 38.6 02/18/2014   PLT 157 02/18/2014   LYMPHOPCT 30 02/15/2014   MONOPCT 10 02/15/2014   EOSPCT 1 02/15/2014   BASOPCT 0 02/15/2014    CMP: Lab Results  Component Value Date   NA 139 02/18/2014   K 4.6 02/18/2014   CL 102 02/18/2014   CO2 17* 02/18/2014   BUN 33* 02/18/2014   CREATININE 0.99 02/18/2014   PROT 7.7 02/01/2014   ALBUMIN 4.1 02/01/2014   BILITOT 0.4 02/01/2014   ALKPHOS 161* 02/01/2014   AST 18 02/01/2014   ALT 18 02/01/2014  .   Total Time in preparing paper work, data evaluation and todays exam - 35 minutes  Thurnell Lose M.D on 02/19/2014 at 9:13 AM  [facility] Office  [phone]   **Disclaimer: This note may have been dictated with voice recognition software. Similar sounding words can inadvertently be transcribed and this note may contain transcription errors which may not have been corrected upon publication of note.**

## 2014-02-19 NOTE — Progress Notes (Signed)
Patient: Carol [name] / Admit Date: 02/15/2014 / Date of Encounter: 02/19/2014, 1:04 PM  Subjective: Feels OK this AM.  Again, only complaint is L foot gout Did not note cramps with 17m Crestor.  Objective: Telemetry: NSR no significant heart block or arrhythmias, rare PVC Physical Exam: Blood pressure 94/52, pulse 69, temperature 97.4 F (36.3 C), temperature source Oral, resp. rate 18, height 5' 7.5" (1.715 m), weight 186 lb 8.2 oz (84.6 kg), SpO2 98.00%. General: Well developed, well nourished WF, in no acute distress. normal mood & affect. Head: Normocephalic, atraumatic, sclera non-icteric, no xanthomas, nares are without discharge. Neck: JVP not elevated. Lungs: Clear bilaterally to auscultation without wheezes, rales, or rhonchi. Breathing is unlabored. Heart: RRR S1 S2 without murmurs, rubs, or gallops.  Abdomen: Soft, non-tender, non-distended with normoactive bowel sounds. No rebound/guarding. Extremities: No clubbing or cyanosis. No edema. Distal pedal pulses are 2+ and equal bilaterally. L wrist has moderate bruising. Neuro: Alert and oriented X 3. Moves all extremities spontaneously.   Intake/Output Summary (Last 24 hours) at 02/19/14 1304 Last data filed at 02/18/14 1600  Gross per 24 hour  Intake      0 ml  Output   1000 ml  Net  -1000 ml    Inpatient Medications:  . [START ON 02/20/2014] allopurinol  300 mg Oral Daily  . aspirin EC  81 mg Oral Daily  . carvedilol  6.25 mg Oral BID WC  . colchicine  0.6 mg Oral BID  . docusate sodium  200 mg Oral BID  . furosemide  20 mg Oral Daily  . insulin aspart  0-20 Units Subcutaneous TID WC  . insulin aspart  0-5 Units Subcutaneous QHS  . insulin aspart  3 Units Subcutaneous TID WC  . insulin detemir  25 Units Subcutaneous BID  . insulin starter kit- pen needles  1 kit Other Once  . isosorbide mononitrate  30 mg Oral Daily  . lisinopril  2.5 mg Oral Daily  . pantoprazole  40 mg Oral Daily  . polyethylene glycol   17 g Oral BID  . rosuvastatin  10 mg Oral QHS  . thyroid  120 mg Oral Daily  . ticagrelor  90 mg Oral BID   Radiology/Studies: No new Cardiac Cath: 02/17/14, L Radial  LM - 20%  LAD: prox 40-50%, mid ~30%;  Small Diag  LCx: Mod caliber, small RI & OM1; moderate OM2.  pCx ~30%, RI 50%; OM2 focal 99% (@ previous POBA site)   PCI with 2 overlapping Xience Alpine DES 2.25 mm x12 mm & 2.25 mm x 8 mm  RCA: large,dominant, small PDA. pRCA ~30, mRCA 40%; rPDA (~1.5 mm) ~90% & stable  EF ~60-65%   Assessment and Plan  Principal Problem:   Post-infarction angina Active Problems:   Type II diabetes mellitus with complication, uncontrolled   Unspecified essential hypertension   Hyperosmolar non-ketotic state in patient with type 2 diabetes mellitus   Abnormal nuclear stress test: Intermediate risk   Presence of drug coated stent in left circumflex coronary artery: OM2 - Xience Alpine DES 2.25 mm x 12, 2.25 mm x 8 mm overlap   Unspecified hypothyroidism   Hyperlipidemia with target LDL less than 70   Ischemic cardiomyopathy - with near resolution of LVEF post MI   Chronic systolic CHF (congestive heart failure)   CKD stage 3 due to type 2 diabetes mellitus  Severe restenosis of OM2 noted @ POBA site -- now Rx with 2.25 mm DES.  Also V Gram suggest Normal EF (as suggested by Myoview on 8/26. Continues to  "autodiurese" - partly related to hyperglycemia, but also with improved EF & renal Fnx is much better.   Borderline hypotensive = would not up-titrate BB in addition to ACE-I.  With rPDA disease, continuing Imdur is not unreasonable for antianginal effect.  On ASA & Brilinta for DES  Had noted weakness as OP & not taking Crestor @ home.  Will try to continue in light of PCI for endothelial stabilization but @ lower dose.  Glycemic control is better, but sugars still high.  I strongly suspect that she will need at least QHS basal Insulin on d/c -- appreciate [facility] taking the lead on  this, No longer in HONK. Stable for d/c today. Will arrange OP f/u with Dr. Estevan Ryder (or APP) @   [facility].    Leonie Man, M.D., M.S. Interventional Cardiologist   Pager # [phone] 02/19/2014

## 2014-02-19 NOTE — Progress Notes (Signed)
Discharged home accompanied by daughter, discharge instructions, prescriptions and  insulin starter kit given to pt.

## 2014-02-19 NOTE — Discharge Instructions (Signed)
Follow with Primary MD PERRY,LAWRENCE EDWARD, MD in 7 days   Get CBC, CMP, 2 view Chest X ray checked  by Primary MD next visit.    Activity: As tolerated with Full fall precautions use walker/cane & assistance as needed   Disposition Home    Diet: Heart Healthy - Low Carb.  Accuchecks 4 times/day, Once in AM empty stomach and then before each meal. Log in all results and show them to your Prim.MD in 3 days. If any glucose reading is under 80 or above 300 call your Prim MD immidiately. Follow Low glucose instructions for glucose under 80 as instructed.   For Heart failure patients - Check your Weight same time everyday, if you gain over 2 pounds, or you develop in leg swelling, experience more shortness of breath or chest pain, call your Primary MD immediately. Follow Cardiac Low Salt Diet and 1.8 lit/day fluid restriction.   On your next visit with her primary care physician please Get Medicines reviewed and adjusted.  Please request your Prim.MD to go over all Hospital Tests and Procedure/Radiological results at the follow up, please get all Hospital records sent to your Prim MD by signing hospital release before you go home.   If you experience worsening of your admission symptoms, develop shortness of breath, life threatening emergency, suicidal or homicidal thoughts you must seek medical attention immediately by calling 911 or calling your MD immediately  if symptoms less severe.  You Must read complete instructions/literature along with all the possible adverse reactions/side effects for all the Medicines you take and that have been prescribed to you. Take any new Medicines after you have completely understood and accpet all the possible adverse reactions/side effects.   Do not drive, operating heavy machinery, perform activities at heights, swimming or participation in water activities or provide baby sitting services if your were admitted for syncope or siezures until you have  seen by Primary MD or a Neurologist and advised to do so again.  Do not drive when taking Pain medications.    Do not take more than prescribed Pain, Sleep and Anxiety Medications  Special Instructions: If you have smoked or chewed Tobacco  in the last 2 yrs please stop smoking, stop any regular Alcohol  and or any Recreational drug use.  Wear Seat belts while driving.   Please note  You were cared for by a hospitalist during your hospital stay. If you have any questions about your discharge medications or the care you received while you were in the hospital after you are discharged, you can call the unit and asked to speak with the hospitalist on call if the hospitalist that took care of you is not available. Once you are discharged, your primary care physician will handle any further medical issues. Please note that NO REFILLS for any discharge medications will be authorized once you are discharged, as it is imperative that you return to your primary care physician (or establish a relationship with a primary care physician if you do not have one) for your aftercare needs so that they can reassess your need for medications and monitor your lab values.

## 2014-02-23 ENCOUNTER — Inpatient Hospital Stay (HOSPITAL_COMMUNITY)
Admission: EM | Admit: 2014-02-23 | Discharge: 2014-02-25 | DRG: 251 | Disposition: A | Discharge status: Home / Self Care | Payer: Medicare Other | Attending: Cardiology | Admitting: Cardiology

## 2014-02-23 ENCOUNTER — Encounter (HOSPITAL_COMMUNITY): Payer: Self-pay | Admitting: Emergency Medicine

## 2014-02-23 ENCOUNTER — Emergency Department (HOSPITAL_COMMUNITY): Payer: Medicare Other

## 2014-02-23 DIAGNOSIS — I498 Other specified cardiac arrhythmias: Secondary | ICD-10-CM | POA: Diagnosis present

## 2014-02-23 DIAGNOSIS — I10 Essential (primary) hypertension: Secondary | ICD-10-CM

## 2014-02-23 DIAGNOSIS — I509 Heart failure, unspecified: Secondary | ICD-10-CM | POA: Diagnosis present

## 2014-02-23 DIAGNOSIS — N184 Chronic kidney disease, stage 4 (severe): Secondary | ICD-10-CM | POA: Diagnosis present

## 2014-02-23 DIAGNOSIS — I447 Left bundle-branch block, unspecified: Secondary | ICD-10-CM | POA: Diagnosis present

## 2014-02-23 DIAGNOSIS — R079 Chest pain, unspecified: Secondary | ICD-10-CM

## 2014-02-23 DIAGNOSIS — E1165 Type 2 diabetes mellitus with hyperglycemia: Secondary | ICD-10-CM

## 2014-02-23 DIAGNOSIS — E118 Type 2 diabetes mellitus with unspecified complications: Secondary | ICD-10-CM

## 2014-02-23 DIAGNOSIS — R739 Hyperglycemia, unspecified: Secondary | ICD-10-CM

## 2014-02-23 DIAGNOSIS — Z8249 Family history of ischemic heart disease and other diseases of the circulatory system: Secondary | ICD-10-CM

## 2014-02-23 DIAGNOSIS — I208 Other forms of angina pectoris: Secondary | ICD-10-CM

## 2014-02-23 DIAGNOSIS — I5022 Chronic systolic (congestive) heart failure: Secondary | ICD-10-CM | POA: Diagnosis present

## 2014-02-23 DIAGNOSIS — Z7982 Long term (current) use of aspirin: Secondary | ICD-10-CM

## 2014-02-23 DIAGNOSIS — Z955 Presence of coronary angioplasty implant and graft: Secondary | ICD-10-CM

## 2014-02-23 DIAGNOSIS — I214 Non-ST elevation (NSTEMI) myocardial infarction: Secondary | ICD-10-CM | POA: Diagnosis not present

## 2014-02-23 DIAGNOSIS — Z8673 Personal history of transient ischemic attack (TIA), and cerebral infarction without residual deficits: Secondary | ICD-10-CM

## 2014-02-23 DIAGNOSIS — Z91041 Radiographic dye allergy status: Secondary | ICD-10-CM

## 2014-02-23 DIAGNOSIS — M109 Gout, unspecified: Secondary | ICD-10-CM | POA: Diagnosis present

## 2014-02-23 DIAGNOSIS — I129 Hypertensive chronic kidney disease with stage 1 through stage 4 chronic kidney disease, or unspecified chronic kidney disease: Secondary | ICD-10-CM | POA: Diagnosis present

## 2014-02-23 DIAGNOSIS — E1122 Type 2 diabetes mellitus with diabetic chronic kidney disease: Secondary | ICD-10-CM

## 2014-02-23 DIAGNOSIS — I255 Ischemic cardiomyopathy: Secondary | ICD-10-CM

## 2014-02-23 DIAGNOSIS — N183 Chronic kidney disease, stage 3 unspecified: Secondary | ICD-10-CM | POA: Diagnosis present

## 2014-02-23 DIAGNOSIS — N1832 Chronic kidney disease, stage 3b: Secondary | ICD-10-CM | POA: Diagnosis present

## 2014-02-23 DIAGNOSIS — K219 Gastro-esophageal reflux disease without esophagitis: Secondary | ICD-10-CM | POA: Diagnosis present

## 2014-02-23 DIAGNOSIS — E785 Hyperlipidemia, unspecified: Secondary | ICD-10-CM

## 2014-02-23 DIAGNOSIS — I2510 Atherosclerotic heart disease of native coronary artery without angina pectoris: Secondary | ICD-10-CM

## 2014-02-23 DIAGNOSIS — Z96659 Presence of unspecified artificial knee joint: Secondary | ICD-10-CM

## 2014-02-23 DIAGNOSIS — I252 Old myocardial infarction: Secondary | ICD-10-CM | POA: Diagnosis present

## 2014-02-23 DIAGNOSIS — J45909 Unspecified asthma, uncomplicated: Secondary | ICD-10-CM | POA: Diagnosis present

## 2014-02-23 DIAGNOSIS — Z9861 Coronary angioplasty status: Secondary | ICD-10-CM

## 2014-02-23 DIAGNOSIS — D649 Anemia, unspecified: Secondary | ICD-10-CM | POA: Diagnosis present

## 2014-02-23 DIAGNOSIS — I2089 Other forms of angina pectoris: Secondary | ICD-10-CM

## 2014-02-23 DIAGNOSIS — R509 Fever, unspecified: Secondary | ICD-10-CM

## 2014-02-23 DIAGNOSIS — Z794 Long term (current) use of insulin: Secondary | ICD-10-CM

## 2014-02-23 DIAGNOSIS — IMO0002 Reserved for concepts with insufficient information to code with codable children: Secondary | ICD-10-CM

## 2014-02-23 HISTORY — DX: Chronic kidney disease, stage 3 unspecified: N18.30

## 2014-02-23 HISTORY — DX: Bradycardia, unspecified: R00.1

## 2014-02-23 HISTORY — DX: Abnormal electrocardiogram (ECG) (EKG): R94.31

## 2014-02-23 HISTORY — DX: Left bundle-branch block, unspecified: I44.7

## 2014-02-23 HISTORY — DX: Anemia, unspecified: D64.9

## 2014-02-23 HISTORY — DX: Radiographic dye allergy status: Z91.041

## 2014-02-23 HISTORY — DX: Chronic kidney disease, stage 3 (moderate): N18.3

## 2014-02-23 HISTORY — DX: Transient cerebral ischemic attack, unspecified: G45.9

## 2014-02-23 LAB — CBC WITH DIFFERENTIAL/PLATELET
Basophils Absolute: 0 10*3/uL (ref 0.0–0.1)
Basophils Relative: 0 % (ref 0–1)
Eosinophils Absolute: 0 10*3/uL (ref 0.0–0.7)
Eosinophils Relative: 0 % (ref 0–5)
HCT: 33.1 % — ABNORMAL LOW (ref 36.0–46.0)
Hemoglobin: 10.9 g/dL — ABNORMAL LOW (ref 12.0–15.0)
Lymphocytes Relative: 9 % — ABNORMAL LOW (ref 12–46)
Lymphs Abs: 0.4 10*3/uL — ABNORMAL LOW (ref 0.7–4.0)
MCH: 28.9 pg (ref 26.0–34.0)
MCHC: 32.9 g/dL (ref 30.0–36.0)
MCV: 87.8 fL (ref 78.0–100.0)
Monocytes Absolute: 0.1 10*3/uL (ref 0.1–1.0)
Monocytes Relative: 1 % — ABNORMAL LOW (ref 3–12)
Neutro Abs: 4.3 10*3/uL (ref 1.7–7.7)
Neutrophils Relative %: 90 % — ABNORMAL HIGH (ref 43–77)
Platelets: 154 10*3/uL (ref 150–400)
RBC: 3.77 MIL/uL — ABNORMAL LOW (ref 3.87–5.11)
RDW: 16.5 % — ABNORMAL HIGH (ref 11.5–15.5)
WBC: 4.8 10*3/uL (ref 4.0–10.5)

## 2014-02-23 LAB — BASIC METABOLIC PANEL
Anion gap: 16 — ABNORMAL HIGH (ref 5–15)
BUN: 41 mg/dL — ABNORMAL HIGH (ref 6–23)
CO2: 20 mEq/L (ref 19–32)
Calcium: 9.1 mg/dL (ref 8.4–10.5)
Chloride: 99 mEq/L (ref 96–112)
Creatinine, Ser: 1.13 mg/dL — ABNORMAL HIGH (ref 0.50–1.10)
GFR calc Af Amer: 54 mL/min — ABNORMAL LOW (ref >90)
GFR calc non Af Amer: 46 mL/min — ABNORMAL LOW (ref >90)
Glucose, Bld: 105 mg/dL — ABNORMAL HIGH (ref 70–99)
Potassium: 4.2 mEq/L (ref 3.7–5.3)
Sodium: 135 mEq/L — ABNORMAL LOW (ref 137–147)

## 2014-02-23 LAB — TROPONIN I: Troponin I: 0.31 ng/mL (ref <0.30)

## 2014-02-23 LAB — I-STAT CG4 LACTIC ACID, ED: Lactic Acid, Venous: 1.2 mmol/L (ref 0.5–2.2)

## 2014-02-23 MED ORDER — FENTANYL CITRATE 0.05 MG/ML IJ SOLN
50.0000 ug | Freq: Once | INTRAMUSCULAR | Status: AC
  Administered 2014-02-23: 50 ug via INTRAVENOUS
  Filled 2014-02-23: qty 2

## 2014-02-23 MED ORDER — ACETAMINOPHEN 325 MG PO TABS: 325.0000 mg | ORAL_TABLET | Freq: Once | ORAL | Status: DC

## 2014-02-23 MED ORDER — ACETAMINOPHEN 325 MG PO TABS
650.0000 mg | ORAL_TABLET | Freq: Once | ORAL | Status: AC
  Administered 2014-02-23: 650 mg via ORAL
  Filled 2014-02-23: qty 2

## 2014-02-23 MED ORDER — SODIUM CHLORIDE 0.9 % IV SOLN
250.0000 mg | Freq: Once | INTRAVENOUS | Status: DC
  Filled 2014-02-23: qty 2

## 2014-02-23 MED ORDER — SODIUM CHLORIDE 0.9 % IV BOLUS (SEPSIS)
1000.0000 mL | Freq: Once | INTRAVENOUS | Status: AC
  Administered 2014-02-23: 1000 mL via INTRAVENOUS

## 2014-02-23 MED ORDER — DIPHENHYDRAMINE HCL 50 MG/ML IJ SOLN
50.0000 mg | Freq: Once | INTRAMUSCULAR | Status: AC
  Administered 2014-02-23: 50 mg via INTRAVENOUS
  Filled 2014-02-23: qty 1

## 2014-02-23 MED ORDER — METHYLPREDNISOLONE SODIUM SUCC 125 MG IJ SOLR
125.0000 mg | Freq: Once | INTRAMUSCULAR | Status: AC
  Administered 2014-02-23: 125 mg via INTRAVENOUS
  Filled 2014-02-23: qty 2

## 2014-02-23 NOTE — ED Notes (Signed)
CT called and made aware of the patient receiving Benadryl and Solumedrol.

## 2014-02-23 NOTE — ED Notes (Signed)
Critical high troponin 0.31, provider and Rn notified.

## 2014-02-23 NOTE — ED Provider Notes (Signed)
CSN: [id number]     Arrival date & time 02/23/14  1906 History   First MD Initiated Contact with Patient 02/23/14 1907     Chief Complaint  Patient presents with  . Chest Pain     (Consider location/radiation/quality/duration/timing/severity/associated sxs/prior Treatment) HPI  78 year old female with history of DM 2, HTN, HLD, ischemic cardiomyopathy with recent NSTEMI in the first week of August 2015, experienced some jaw pain on 02/10/14 and went to the cardiologist's office on 02/15/14 for Myoview stress test and lab work. However her blood glucose was 783, she was complaining of DOE and intermittent jaw discomfort. Was admitted at that time. A nuclear stress test was ordered and revealed what amounts to be a large lateral infarct that seemsed out of proportion to extent of her MI earlier in the month. Had cath which showed severe stenosis OM2 and had successful PTCA/DES x2. Was doing well since up until around 1700 today when began having substernal CP and shaking chills after returning from walking to mailbox. Associated with nausea. Vomited x1. Denies abdominal pain.       Past Medical History  Diagnosis Date  . Hypertension   . Asthma   . Hyperlipidemia   . Gout, unspecified   . CAD S/P percutaneous coronary angioplasty 01/22/2014    a) (12/2013): POBA - OM2 99% (very tortuous segment) - reduced ~ 50% & TIMI 3 flow, 80-90% stenosis in mid PDA, Irregularities <50% in mRCA, pLAD and prox LCx;; b) 02/17/14: Moderate sized fixed defect in Lateral wall --> cath with OM2 restenosis & otherwise stable --> Xience Alpine DES x 2 (2.25 mm x 12 & 8 mm)  . Chronic systolic heart failure     a. ICM b. ECHO (01/2014): EF 25-30%, grade I DD, trivial MR; b) EF by Myoview 02/17/14 = ~53%  . NSTEMI (non-ST elevated myocardial infarction) 2014; 12/2013  . Environmental allergies   . Pneumonia     "2-3 times" (01/25/2014)  . Hyperthyroidism   . Type II diabetes mellitus   . GERD (gastroesophageal reflux  disease)   . Stroke 1990's    "mild", denies residual   Past Surgical History  Procedure Laterality Date  . Shoulder open rotator cuff repair Bilateral 1980's - 1990's  . Total knee arthroplasty Right 2009  . Cesarean section  [id number]; 1960  . Abdominal hysterectomy    . Appendectomy  1959  . Cholecystectomy  1989  . Joint replacement    . Reduction mammaplasty Bilateral 1990's  . Cataract extraction w/ intraocular lens  implant, bilateral Bilateral 2013  . Coronary angioplasty  12/2013    POBA - OM2  . Percutaneous coronary stent intervention (pci-s)  02/17/14    For restenosis of OM2 - PCI with Xience Apline DES 2.25 mm x 12 mm & 2.25 mm x 8 mm overlapping   Family History  Problem Relation Age of Onset  . Hypertension Mother   . Diabetes Mother   . Heart attack Mother 39  . Heart disease Father   . Diabetes Brother   . Heart disease Brother    History  Substance Use Topics  . Smoking status: Never Smoker   . Smokeless tobacco: Never Used  . Alcohol Use: No   OB History   Grav Para Term Preterm Abortions TAB SAB Ect Mult Living                 Review of Systems  All systems reviewed and negative, other than as noted in HPI.  Allergies  Contrast media; Cortizone-10; Lipitor; and Zocor  Home Medications   Prior to Admission medications   Medication Sig Start Date End Date Taking? Authorizing Provider  albuterol (PROVENTIL HFA;VENTOLIN HFA) 108 (90 BASE) MCG/ACT inhaler Inhale 1 puff into the lungs every 6 (six) hours as needed for wheezing or shortness of breath.   Yes Historical Provider, MD  allopurinol (ZYLOPRIM) 300 MG tablet Take 300 mg by mouth daily.   Yes Historical Provider, MD  aspirin EC 81 MG EC tablet Take 1 tablet (81 mg total) by mouth daily. 01/25/14  Yes Rande Brunt, NP  atorvastatin (LIPITOR) 80 MG tablet Take 1 tablet (80 mg total) by mouth daily at 6 PM. 01/25/14  Yes Rande Brunt, NP  Biotin 5000 MCG CAPS Take 1 capsule by mouth daily.   Yes  Historical Provider, MD  carvedilol (COREG) 6.25 MG tablet Take 1 tablet (6.25 mg total) by mouth 2 (two) times daily with a meal. 01/25/14  Yes Rande Brunt, NP  colchicine 0.6 MG tablet Take 1 tablet (0.6 mg total) by mouth 2 (two) times daily. 02/19/14  Yes Thurnell Lose, MD  insulin aspart (NOVOLOG) 100 UNIT/ML injection Before each meal 3 times a day , 140-199 - 4 units, 200-250 - 6 units, 251-299 - 8 units,  300-349 - 10 units,  350 or above 12 units.Insulin PEN if approved, provide syringes and needles if needed. 02/19/14  Yes Thurnell Lose, MD  insulin detemir (LEVEMIR) 100 UNIT/ML injection Inject 0.25 mLs (25 Units total) into the skin 2 (two) times daily. Syringes and needles as needed 02/19/14  Yes Thurnell Lose, MD  isosorbide mononitrate (IMDUR) 30 MG 24 hr tablet Take 1 tablet (30 mg total) by mouth daily. 02/15/14  Yes Rande Brunt, NP  lisinopril (PRINIVIL,ZESTRIL) 2.5 MG tablet Take 1 tablet (2.5 mg total) by mouth daily. 02/19/14  Yes Thurnell Lose, MD  nitroGLYCERIN (NITROSTAT) 0.4 MG SL tablet Place 0.4 mg under the tongue every 5 (five) minutes as needed for chest pain.   Yes Historical Provider, MD  omeprazole (PRILOSEC) 20 MG capsule Take 20 mg by mouth daily.   Yes Historical Provider, MD  thyroid (ARMOUR) 60 MG tablet Take 120 mg by mouth daily. Take two tabs once a day for 90 days (09/04/2012)   Yes Historical Provider, MD  ticagrelor (BRILINTA) 90 MG TABS tablet Take 1 tablet (90 mg total) by mouth 2 (two) times daily. 01/25/14  Yes Rande Brunt, NP  Insulin Syringe-Needle U-100 25G X 1" 1 ML MISC Dispense for taking Levemir and Novolog 1 month supply, if PEN not approved. 02/19/14   Thurnell Lose, MD   BP 116/64  Pulse 96  Temp(Src) 101.1 F (38.4 C) (Oral)  Resp 16  SpO2 98% Physical Exam  Nursing note and vitals reviewed. Constitutional: She appears well-developed and well-nourished. No distress.  HENT:  Head: Normocephalic and atraumatic.  Eyes:  Conjunctivae are normal. Right eye exhibits no discharge. Left eye exhibits no discharge.  Neck: Neck supple.  Cardiovascular: Regular rhythm and normal heart sounds.  Exam reveals no gallop and no friction rub.   No murmur heard. Mild tachycardia  Pulmonary/Chest: Effort normal and breath sounds normal. No respiratory distress.  Abdominal: Soft. She exhibits no distension. There is no tenderness.  Musculoskeletal: She exhibits no edema and no tenderness.  Lower extremities symmetric as compared to each other. No calf tenderness. Negative Homan's. No palpable cords.   Neurological: She  is alert.  Skin: Skin is warm and dry. She is not diaphoretic.  Psychiatric: She has a normal mood and affect. Her behavior is normal. Thought content normal.    ED Course  Procedures (including critical care time)  CRITICAL CARE Performed by: Virgel Manifold  Total critical care time: 35 minutes  Critical care time was exclusive of separately billable procedures and treating other patients. Critical care was necessary to treat or prevent imminent or life-threatening deterioration. Critical care was time spent personally by me on the following activities: development of treatment plan with patient and/or surrogate as well as nursing, discussions with consultants, evaluation of patient's response to treatment, examination of patient, obtaining history from patient or surrogate, ordering and performing treatments and interventions, ordering and review of laboratory studies, ordering and review of radiographic studies, pulse oximetry and re-evaluation of patient's condition.  Labs Review Labs Reviewed  CBC WITH DIFFERENTIAL - Abnormal; Notable for the following:    RBC 3.77 (*)    Hemoglobin 10.9 (*)    HCT 33.1 (*)    RDW 16.5 (*)    Neutrophils Relative % 90 (*)    Lymphocytes Relative 9 (*)    Lymphs Abs 0.4 (*)    Monocytes Relative 1 (*)    All other components within normal limits  CULTURE,  BLOOD (ROUTINE X 2)  CULTURE, BLOOD (ROUTINE X 2)  BASIC METABOLIC PANEL  TROPONIN I  I-STAT CG4 LACTIC ACID, ED    Imaging Review Dg Chest 2 View  02/23/2014   CLINICAL DATA:  CHEST PAIN  EXAM: CHEST - 2 VIEW  COMPARISON:  01/22/2014  FINDINGS: Relatively low lung volumes. Lungs are clear. Heart size and mediastinal contours are within normal limits. No effusion. Postop change at the distal right clavicle. Small spurs in the mid thoracic spine.  IMPRESSION: No acute cardiopulmonary disease.   Electronically Signed   By: Arne Cleveland M.D.   On: 02/23/2014 20:46   Ct Angio Chest W/cm &/or Wo Cm  02/24/2014   CLINICAL DATA:  CP. fever. Recent MI. CP. fever. Recent MI.  EXAM: CT ANGIOGRAPHY CHEST WITH CONTRAST  TECHNIQUE: Multidetector CT imaging of the chest was performed using the standard protocol during bolus administration of intravenous contrast. Multiplanar CT image reconstructions and MIPs were obtained to evaluate the vascular anatomy. Patient was pre-medicated due to history of contrast allergy, without complication.  CONTRAST:  164mL OMNIPAQUE IOHEXOL 350 MG/ML SOLN  COMPARISON:  01/22/2014  FINDINGS: Satisfactory opacification of pulmonary arteries noted, and there is no evidence of pulmonary emboli. No significant aortic enhancement ; No evidence of aneurysm . Patchy aortic arch and coronary calcifications. No pleural or pericardial effusion. No hilar or mediastinal adenopathy. Moderate hiatal hernia involving gastric fundus. Minimal subsegmental/dependent atelectasis posteriorly in the lower lobes. Lungs are otherwise clear. Spurring at multiple contiguous levels in the mid and lower thoracic spine. Scattered small calcified granulomas in the spleen. Remainder visualized upper abdomen unremarkable.  Review of the MIP images confirms the above findings.  IMPRESSION: 1. Negative for acute PE. 2. Atherosclerosis, including coronary artery disease. Please note that although the presence of  coronary artery calcium documents the presence of coronary artery disease, the severity of this disease and any potential stenosis cannot be assessed on this non-gated CT examination. Assessment for potential risk factor modification, dietary therapy or pharmacologic therapy may be warranted, if clinically indicated. 3. Hiatal hernia.   Electronically Signed   By: Arne Cleveland M.D.   On: 02/24/2014 01:10  EKG Interpretation None      MDM   Final diagnoses:  Chest pain, unspecified chest pain type  Fever, unspecified fever cause  S/P coronary artery stent placement    77yF with CP. Consider early stent thrombosis. EKG does show what appears to be new LBBB. Minimal elevation of troponin. Symptom onset around 1700. Noted to be febrile though, c/o rigors. Consider Dressler's syndrome. Consider infectious etiology such as pneumonia. Consider PE. Less likely dissection, pneumothorax, other. Discussed with cardiology and they consulted. Pain not celarly cardiac and etiology of fever not completely clear. Will CT to eval for PE or occult pneumonia. Hx of contrast allergy. Happened 10 years ago when getting study for rotator cuff injury? OSH. Can't remember details. Two recent catheterizations w/o apparent problem. Should be fine with 1 hour prep.   CT angiography without acute abnormality. Second troponin did subsequently come back higher. Discussed again with cardiology. Heparin ordered. Source of fever not clear at this point. Antibiotics were deferred at this time. She does have some mild hypotension, but she was in this range at discharge a couple days ago.    Virgel Manifold, MD 02/24/14 [phone]

## 2014-02-23 NOTE — ED Notes (Signed)
Per EMS, Patient was walking and started to have sudden chest pain in the lower mid chest that radiates to the neck. Patient states, "It hurts when my heart burns." Vitals per EMS: 104 Palpable, 92 HR, 98 % on RA, 14 RR, 7/10 Chest pain

## 2014-02-23 NOTE — ED Notes (Signed)
MD Kohut at the bedside.

## 2014-02-23 NOTE — ED Notes (Signed)
Patient unable to go to the bathroom at this time.

## 2014-02-23 NOTE — ED Notes (Signed)
Pt can go for CT at 1150.

## 2014-02-23 NOTE — ED Notes (Signed)
Cardiologist at the bedside

## 2014-02-24 ENCOUNTER — Encounter (HOSPITAL_COMMUNITY)
Admission: EM | Disposition: A | Discharge status: Home / Self Care | Payer: Self-pay | Source: Home / Self Care | Attending: Cardiology

## 2014-02-24 ENCOUNTER — Emergency Department (HOSPITAL_COMMUNITY): Payer: Medicare Other

## 2014-02-24 ENCOUNTER — Encounter (HOSPITAL_COMMUNITY): Payer: Self-pay

## 2014-02-24 DIAGNOSIS — IMO0002 Reserved for concepts with insufficient information to code with codable children: Secondary | ICD-10-CM | POA: Diagnosis present

## 2014-02-24 DIAGNOSIS — Z794 Long term (current) use of insulin: Secondary | ICD-10-CM | POA: Diagnosis not present

## 2014-02-24 DIAGNOSIS — R079 Chest pain, unspecified: Secondary | ICD-10-CM | POA: Diagnosis present

## 2014-02-24 DIAGNOSIS — J45909 Unspecified asthma, uncomplicated: Secondary | ICD-10-CM | POA: Diagnosis present

## 2014-02-24 DIAGNOSIS — M109 Gout, unspecified: Secondary | ICD-10-CM | POA: Diagnosis present

## 2014-02-24 DIAGNOSIS — I129 Hypertensive chronic kidney disease with stage 1 through stage 4 chronic kidney disease, or unspecified chronic kidney disease: Secondary | ICD-10-CM | POA: Diagnosis present

## 2014-02-24 DIAGNOSIS — Z8673 Personal history of transient ischemic attack (TIA), and cerebral infarction without residual deficits: Secondary | ICD-10-CM | POA: Diagnosis not present

## 2014-02-24 DIAGNOSIS — I5022 Chronic systolic (congestive) heart failure: Secondary | ICD-10-CM | POA: Diagnosis present

## 2014-02-24 DIAGNOSIS — E119 Type 2 diabetes mellitus without complications: Secondary | ICD-10-CM

## 2014-02-24 DIAGNOSIS — R509 Fever, unspecified: Secondary | ICD-10-CM

## 2014-02-24 DIAGNOSIS — I498 Other specified cardiac arrhythmias: Secondary | ICD-10-CM | POA: Diagnosis present

## 2014-02-24 DIAGNOSIS — I252 Old myocardial infarction: Secondary | ICD-10-CM | POA: Diagnosis not present

## 2014-02-24 DIAGNOSIS — Z91041 Radiographic dye allergy status: Secondary | ICD-10-CM | POA: Diagnosis not present

## 2014-02-24 DIAGNOSIS — Z8249 Family history of ischemic heart disease and other diseases of the circulatory system: Secondary | ICD-10-CM | POA: Diagnosis not present

## 2014-02-24 DIAGNOSIS — I509 Heart failure, unspecified: Secondary | ICD-10-CM | POA: Diagnosis present

## 2014-02-24 DIAGNOSIS — Z96659 Presence of unspecified artificial knee joint: Secondary | ICD-10-CM | POA: Diagnosis not present

## 2014-02-24 DIAGNOSIS — Z9861 Coronary angioplasty status: Secondary | ICD-10-CM | POA: Diagnosis not present

## 2014-02-24 DIAGNOSIS — I2510 Atherosclerotic heart disease of native coronary artery without angina pectoris: Secondary | ICD-10-CM | POA: Diagnosis present

## 2014-02-24 DIAGNOSIS — K219 Gastro-esophageal reflux disease without esophagitis: Secondary | ICD-10-CM | POA: Diagnosis present

## 2014-02-24 DIAGNOSIS — D649 Anemia, unspecified: Secondary | ICD-10-CM | POA: Diagnosis present

## 2014-02-24 DIAGNOSIS — I214 Non-ST elevation (NSTEMI) myocardial infarction: Secondary | ICD-10-CM | POA: Diagnosis present

## 2014-02-24 DIAGNOSIS — E1165 Type 2 diabetes mellitus with hyperglycemia: Secondary | ICD-10-CM | POA: Diagnosis not present

## 2014-02-24 DIAGNOSIS — I447 Left bundle-branch block, unspecified: Secondary | ICD-10-CM | POA: Diagnosis present

## 2014-02-24 DIAGNOSIS — E785 Hyperlipidemia, unspecified: Secondary | ICD-10-CM | POA: Diagnosis present

## 2014-02-24 DIAGNOSIS — Z7982 Long term (current) use of aspirin: Secondary | ICD-10-CM | POA: Diagnosis not present

## 2014-02-24 DIAGNOSIS — N183 Chronic kidney disease, stage 3 unspecified: Secondary | ICD-10-CM | POA: Diagnosis present

## 2014-02-24 HISTORY — PX: LEFT HEART CATHETERIZATION WITH CORONARY ANGIOGRAM: SHX5451

## 2014-02-24 HISTORY — PX: CARDIAC CATHETERIZATION: SHX172

## 2014-02-24 LAB — PROTIME-INR
INR: 1.14 (ref 0.00–1.49)
Prothrombin Time: 14.6 seconds (ref 11.6–15.2)

## 2014-02-24 LAB — LIPID PANEL
CHOL/HDL RATIO: 2.9 ratio
CHOLESTEROL: 84 mg/dL (ref 0–200)
HDL: 29 mg/dL — ABNORMAL LOW (ref >39)
LDL CALC: 46 mg/dL (ref 0–99)
TRIGLYCERIDES: 45 mg/dL (ref <150)
VLDL: 9 mg/dL (ref 0–40)

## 2014-02-24 LAB — GLUCOSE, CAPILLARY
GLUCOSE-CAPILLARY: 226 mg/dL — AB (ref 70–99)
GLUCOSE-CAPILLARY: 146 mg/dL — AB (ref 70–99)
Glucose-Capillary: 190 mg/dL — ABNORMAL HIGH (ref 70–99)
Glucose-Capillary: 239 mg/dL — ABNORMAL HIGH (ref 70–99)
Glucose-Capillary: 268 mg/dL — ABNORMAL HIGH (ref 70–99)
Glucose-Capillary: 285 mg/dL — ABNORMAL HIGH (ref 70–99)

## 2014-02-24 LAB — CBC
HEMATOCRIT: 31.7 % — AB (ref 36.0–46.0)
Hemoglobin: 10.3 g/dL — ABNORMAL LOW (ref 12.0–15.0)
MCH: 28.9 pg (ref 26.0–34.0)
MCHC: 32.5 g/dL (ref 30.0–36.0)
MCV: 88.8 fL (ref 78.0–100.0)
Platelets: 153 10*3/uL (ref 150–400)
RBC: 3.57 MIL/uL — AB (ref 3.87–5.11)
RDW: 16.7 % — ABNORMAL HIGH (ref 11.5–15.5)
WBC: 11.2 10*3/uL — ABNORMAL HIGH (ref 4.0–10.5)

## 2014-02-24 LAB — APTT

## 2014-02-24 LAB — BASIC METABOLIC PANEL
Anion gap: 13 (ref 5–15)
BUN: 36 mg/dL — ABNORMAL HIGH (ref 6–23)
CO2: 19 meq/L (ref 19–32)
Calcium: 8.6 mg/dL (ref 8.4–10.5)
Chloride: 104 mEq/L (ref 96–112)
Creatinine, Ser: 1.15 mg/dL — ABNORMAL HIGH (ref 0.50–1.10)
GFR calc Af Amer: 53 mL/min — ABNORMAL LOW (ref >90)
GFR calc non Af Amer: 45 mL/min — ABNORMAL LOW (ref >90)
GLUCOSE: 183 mg/dL — AB (ref 70–99)
POTASSIUM: 4.9 meq/L (ref 3.7–5.3)
Sodium: 136 mEq/L — ABNORMAL LOW (ref 137–147)

## 2014-02-24 LAB — MRSA PCR SCREENING: MRSA by PCR: NEGATIVE

## 2014-02-24 LAB — HEPARIN LEVEL (UNFRACTIONATED): Heparin Unfractionated: 0.37 IU/mL (ref 0.30–0.70)

## 2014-02-24 LAB — POCT ACTIVATED CLOTTING TIME: Activated Clotting Time: 275 seconds

## 2014-02-24 LAB — TROPONIN I
TROPONIN I: 0.46 ng/mL — AB (ref <0.30)
Troponin I: 0.68 ng/mL (ref <0.30)

## 2014-02-24 LAB — MAGNESIUM: Magnesium: 2.1 mg/dL (ref 1.5–2.5)

## 2014-02-24 SURGERY — LEFT HEART CATHETERIZATION WITH CORONARY ANGIOGRAM
Anesthesia: LOCAL

## 2014-02-24 MED ORDER — MORPHINE SULFATE 2 MG/ML IJ SOLN
INTRAMUSCULAR | Status: AC
  Filled 2014-02-24: qty 1

## 2014-02-24 MED ORDER — METHYLPREDNISOLONE SODIUM SUCC 125 MG IJ SOLR
125.0000 mg | Freq: Once | INTRAMUSCULAR | Status: AC
  Administered 2014-02-24: 13:00:00 125 mg via INTRAVENOUS
  Filled 2014-02-24: qty 2

## 2014-02-24 MED ORDER — HEPARIN (PORCINE) IN NACL 2-0.9 UNIT/ML-% IJ SOLN
INTRAMUSCULAR | Status: AC
  Filled 2014-02-24: qty 1500

## 2014-02-24 MED ORDER — ACETAMINOPHEN 325 MG PO TABS: 650.0000 mg | ORAL_TABLET | ORAL | Status: DC | PRN

## 2014-02-24 MED ORDER — ATORVASTATIN CALCIUM 80 MG PO TABS
80.0000 mg | ORAL_TABLET | Freq: Every day | ORAL | Status: DC
  Filled 2014-02-24 (×2): qty 1

## 2014-02-24 MED ORDER — INSULIN DETEMIR 100 UNIT/ML ~~LOC~~ SOLN: 25.0000 [IU] | Freq: Two times a day (BID) | SUBCUTANEOUS | Status: DC

## 2014-02-24 MED ORDER — ASPIRIN EC 81 MG PO TBEC
81.0000 mg | DELAYED_RELEASE_TABLET | Freq: Every day | ORAL | Status: DC
  Administered 2014-02-24 – 2014-02-25 (×2): 81 mg via ORAL
  Filled 2014-02-24 (×2): qty 1

## 2014-02-24 MED ORDER — SODIUM CHLORIDE 0.9 % IJ SOLN
3.0000 mL | Freq: Two times a day (BID) | INTRAMUSCULAR | Status: DC
Start: 2014-02-24 — End: 2014-02-24

## 2014-02-24 MED ORDER — FENTANYL CITRATE 0.05 MG/ML IJ SOLN
INTRAMUSCULAR | Status: AC
  Filled 2014-02-24: qty 2

## 2014-02-24 MED ORDER — TICAGRELOR 90 MG PO TABS
90.0000 mg | ORAL_TABLET | Freq: Two times a day (BID) | ORAL | Status: DC
  Administered 2014-02-24 – 2014-02-25 (×3): 90 mg via ORAL
  Filled 2014-02-24 (×4): qty 1

## 2014-02-24 MED ORDER — IOHEXOL 350 MG/ML SOLN
100.0000 mL | Freq: Once | INTRAVENOUS | Status: AC | PRN
  Administered 2014-02-24: 100 mL via INTRAVENOUS

## 2014-02-24 MED ORDER — LIDOCAINE HCL (PF) 1 % IJ SOLN
INTRAMUSCULAR | Status: AC
  Filled 2014-02-24: qty 30

## 2014-02-24 MED ORDER — NITROGLYCERIN 0.4 MG SL SUBL: 0.4000 mg | SUBLINGUAL_TABLET | SUBLINGUAL | Status: DC | PRN

## 2014-02-24 MED ORDER — ISOSORBIDE MONONITRATE ER 30 MG PO TB24
30.0000 mg | ORAL_TABLET | Freq: Every day | ORAL | Status: DC
  Administered 2014-02-24 – 2014-02-25 (×2): 30 mg via ORAL
  Filled 2014-02-24 (×2): qty 1

## 2014-02-24 MED ORDER — LISINOPRIL 2.5 MG PO TABS
2.5000 mg | ORAL_TABLET | Freq: Every day | ORAL | Status: DC
  Administered 2014-02-24 – 2014-02-25 (×2): 2.5 mg via ORAL
  Filled 2014-02-24 (×2): qty 1

## 2014-02-24 MED ORDER — ASPIRIN 81 MG PO CHEW: 324.0000 mg | CHEWABLE_TABLET | ORAL | Status: DC

## 2014-02-24 MED ORDER — SODIUM CHLORIDE 0.9 % IV SOLN: 250.0000 mL | INTRAVENOUS | Status: DC | PRN

## 2014-02-24 MED ORDER — HEPARIN (PORCINE) IN NACL 100-0.45 UNIT/ML-% IJ SOLN
850.0000 [IU]/h | INTRAMUSCULAR | Status: DC
  Administered 2014-02-24: 05:00:00 850 [IU]/h via INTRAVENOUS
  Filled 2014-02-24: qty 250

## 2014-02-24 MED ORDER — HEPARIN SODIUM (PORCINE) 1000 UNIT/ML IJ SOLN
INTRAMUSCULAR | Status: AC
  Filled 2014-02-24: qty 1

## 2014-02-24 MED ORDER — ALLOPURINOL 300 MG PO TABS
300.0000 mg | ORAL_TABLET | Freq: Every day | ORAL | Status: DC
  Administered 2014-02-24 – 2014-02-25 (×2): 300 mg via ORAL
  Filled 2014-02-24 (×2): qty 1

## 2014-02-24 MED ORDER — INSULIN ASPART 100 UNIT/ML ~~LOC~~ SOLN
0.0000 [IU] | Freq: Three times a day (TID) | SUBCUTANEOUS | Status: DC
  Administered 2014-02-24: 19:00:00 8 [IU] via SUBCUTANEOUS
  Administered 2014-02-24: 10:00:00 via SUBCUTANEOUS
  Administered 2014-02-25: 10 [IU] via SUBCUTANEOUS

## 2014-02-24 MED ORDER — SODIUM CHLORIDE 0.9 % IV SOLN: INTRAVENOUS | Status: DC

## 2014-02-24 MED ORDER — SODIUM CHLORIDE 0.9 % IV SOLN
INTRAVENOUS | Status: AC
  Administered 2014-02-24: 15:00:00 via INTRAVENOUS

## 2014-02-24 MED ORDER — VERAPAMIL HCL 2.5 MG/ML IV SOLN
INTRAVENOUS | Status: AC
  Filled 2014-02-24: qty 2

## 2014-02-24 MED ORDER — ALBUTEROL SULFATE HFA 108 (90 BASE) MCG/ACT IN AERS: 1.0000 | INHALATION_SPRAY | Freq: Four times a day (QID) | RESPIRATORY_TRACT | Status: DC | PRN

## 2014-02-24 MED ORDER — NITROGLYCERIN 1 MG/10 ML FOR IR/CATH LAB
INTRA_ARTERIAL | Status: AC
  Filled 2014-02-24: qty 10

## 2014-02-24 MED ORDER — SODIUM CHLORIDE 0.9 % IV SOLN
1.0000 mL/kg/h | INTRAVENOUS | Status: DC
Start: 2014-02-24 — End: 2014-02-24

## 2014-02-24 MED ORDER — FAMOTIDINE IN NACL 20-0.9 MG/50ML-% IV SOLN
20.0000 mg | Freq: Once | INTRAVENOUS | Status: AC
  Administered 2014-02-24: 13:00:00 20 mg via INTRAVENOUS
  Filled 2014-02-24: qty 50

## 2014-02-24 MED ORDER — MORPHINE SULFATE 2 MG/ML IJ SOLN: 2.0000 mg | Freq: Once | INTRAMUSCULAR | Status: DC

## 2014-02-24 MED ORDER — MORPHINE SULFATE 2 MG/ML IJ SOLN
2.0000 mg | Freq: Once | INTRAMUSCULAR | Status: AC
  Administered 2014-02-24: 2 mg via INTRAVENOUS

## 2014-02-24 MED ORDER — ASPIRIN 81 MG PO CHEW
324.0000 mg | CHEWABLE_TABLET | Freq: Once | ORAL | Status: DC
  Filled 2014-02-24: qty 4

## 2014-02-24 MED ORDER — CARVEDILOL 6.25 MG PO TABS
6.2500 mg | ORAL_TABLET | Freq: Two times a day (BID) | ORAL | Status: DC
  Administered 2014-02-24 (×2): 6.25 mg via ORAL
  Filled 2014-02-24 (×5): qty 1

## 2014-02-24 MED ORDER — ONDANSETRON HCL 4 MG/2ML IJ SOLN: 4.0000 mg | Freq: Four times a day (QID) | INTRAMUSCULAR | Status: DC | PRN

## 2014-02-24 MED ORDER — HEPARIN (PORCINE) IN NACL 100-0.45 UNIT/ML-% IJ SOLN
1000.0000 [IU]/h | INTRAMUSCULAR | Status: DC
  Administered 2014-02-24: 1000 [IU]/h via INTRAVENOUS
  Filled 2014-02-24: qty 250

## 2014-02-24 MED ORDER — SODIUM CHLORIDE 0.9 % IJ SOLN: 3.0000 mL | INTRAMUSCULAR | Status: DC | PRN

## 2014-02-24 MED ORDER — MIDAZOLAM HCL 2 MG/2ML IJ SOLN
INTRAMUSCULAR | Status: AC
  Filled 2014-02-24: qty 2

## 2014-02-24 MED ORDER — HEPARIN SODIUM (PORCINE) 1000 UNIT/ML IJ SOLN
INTRAMUSCULAR | Status: AC
Start: 2014-02-24 — End: 2014-02-24
  Filled 2014-02-24: qty 1

## 2014-02-24 MED ORDER — COLCHICINE 0.6 MG PO TABS
0.6000 mg | ORAL_TABLET | Freq: Two times a day (BID) | ORAL | Status: DC
  Administered 2014-02-24 – 2014-02-25 (×4): 0.6 mg via ORAL
  Filled 2014-02-24 (×5): qty 1

## 2014-02-24 MED ORDER — INSULIN ASPART 100 UNIT/ML ~~LOC~~ SOLN
3.0000 [IU] | Freq: Once | SUBCUTANEOUS | Status: AC
  Administered 2014-02-24: 3 [IU] via SUBCUTANEOUS

## 2014-02-24 MED ORDER — HEPARIN BOLUS VIA INFUSION
4000.0000 [IU] | Freq: Once | INTRAVENOUS | Status: AC
  Administered 2014-02-24: 4000 [IU] via INTRAVENOUS
  Filled 2014-02-24: qty 4000

## 2014-02-24 MED ORDER — INSULIN DETEMIR 100 UNIT/ML ~~LOC~~ SOLN
25.0000 [IU] | Freq: Two times a day (BID) | SUBCUTANEOUS | Status: DC
  Administered 2014-02-25 (×2): 25 [IU] via SUBCUTANEOUS
  Filled 2014-02-24 (×4): qty 0.25

## 2014-02-24 MED ORDER — PANTOPRAZOLE SODIUM 40 MG PO TBEC
40.0000 mg | DELAYED_RELEASE_TABLET | Freq: Every day | ORAL | Status: DC
  Administered 2014-02-24 – 2014-02-25 (×2): 40 mg via ORAL
  Filled 2014-02-24 (×2): qty 1

## 2014-02-24 MED ORDER — DIPHENHYDRAMINE HCL 50 MG/ML IJ SOLN
25.0000 mg | Freq: Once | INTRAMUSCULAR | Status: AC
  Administered 2014-02-24: 25 mg via INTRAVENOUS
  Filled 2014-02-24: qty 1

## 2014-02-24 MED ORDER — THYROID 120 MG PO TABS
120.0000 mg | ORAL_TABLET | Freq: Every day | ORAL | Status: DC
  Administered 2014-02-24 – 2014-02-25 (×2): 120 mg via ORAL
  Filled 2014-02-24 (×2): qty 1

## 2014-02-24 MED ORDER — ALBUTEROL SULFATE (2.5 MG/3ML) 0.083% IN NEBU
3.0000 mL | INHALATION_SOLUTION | Freq: Four times a day (QID) | RESPIRATORY_TRACT | Status: DC | PRN
Start: 2014-02-24 — End: 2014-02-25

## 2014-02-24 NOTE — Progress Notes (Signed)
ANTICOAGULATION CONSULT NOTE - Follow Up Consult  Pharmacy Consult for heparin Indication: chest pain/ACS  Allergies  Allergen Reactions  . Contrast Media [Iodinated Diagnostic Agents] Other (See Comments)    unknown  . Cortizone-10 [Hydrocortisone] Other (See Comments)    unknown  . Lipitor [Atorvastatin]     Muscle aches- severe  . Zocor [Simvastatin]     Muscle aches- severe    Patient Measurements: Height: 5' 7.5" (171.5 cm) Weight: 184 lb 15.5 oz (83.9 kg) IBW/kg (Calculated) : 62.75 Heparin Dosing Weight: 80 kg  Vital Signs: Temp: 98 F (36.7 C) (09/02 1238) Temp src: Oral (09/02 1238) BP: 107/55 mmHg (09/02 1238) Pulse Rate: 65 (09/02 1238)  Labs:  Recent Labs  02/23/14 1915 02/23/14 2353 02/24/14 0259 02/24/14 1210 02/24/14 1247  HGB 10.9*  --  10.3*  --   --   HCT 33.1*  --  31.7*  --   --   PLT 154  --  153  --   --   APTT  --   --  >200*  --   --   LABPROT  --   --   --  14.6  --   INR  --   --   --  1.14  --   HEPARINUNFRC  --   --   --   --  0.37  CREATININE 1.13*  --  1.15*  --   --   TROPONINI 0.31* 0.68* 0.46*  --   --     Estimated Creatinine Clearance: 47.5 ml/min (by C-G formula based on Cr of 1.15).   Medications:  Scheduled:  . allopurinol  300 mg Oral Daily  . aspirin  324 mg Oral Pre-Cath  . aspirin  324 mg Oral Once  . aspirin EC  81 mg Oral Daily  . atorvastatin  80 mg Oral q1800  . carvedilol  6.25 mg Oral BID WC  . colchicine  0.6 mg Oral BID  . famotidine (PEPCID) IV  20 mg Intravenous Once  . insulin aspart  0-12 Units Subcutaneous TID WC  . [START ON 02/25/2014] insulin detemir  25 Units Subcutaneous BID  . lisinopril  2.5 mg Oral Daily  . pantoprazole  40 mg Oral Daily  . sodium chloride  3 mL Intravenous Q12H  . thyroid  120 mg Oral Daily  . ticagrelor  90 mg Oral BID   Infusions:  . sodium chloride    . sodium chloride    . heparin 850 Units/hr (02/24/14 0507)    Assessment: 78 yo female with ACS is currently  on therapeutic heparin.  Heparin level was 0.37. Goal of Therapy:  Heparin level 0.3-0.7 units/ml Monitor platelets by anticoagulation protocol: Yes   Plan:  - cont heparin at 850 units/hr - f/u after cath  Seanpatrick Maisano, Tsz-Yin 02/24/2014,1:42 PM

## 2014-02-24 NOTE — Progress Notes (Signed)
ANTICOAGULATION CONSULT NOTE - Initial Consult  Pharmacy Consult for Heparin Indication: chest pain/ACS  Allergies  Allergen Reactions  . Contrast Media [Iodinated Diagnostic Agents] Other (See Comments)    unknown  . Cortizone-10 [Hydrocortisone] Other (See Comments)    unknown  . Lipitor [Atorvastatin]     Muscle aches- severe  . Zocor [Simvastatin]     Muscle aches- severe    Patient Measurements: Height: 5' 7.5" (171.5 cm) Weight: 186 lb (84.369 kg) IBW/kg (Calculated) : 62.75 Heparin Dosing Weight: 80 kg  Vital Signs: Temp: 101.1 F (38.4 C) (09/01 1916) Temp src: Oral (09/01 1916) BP: 100/48 mmHg (09/02 0000) Pulse Rate: 81 (09/02 0000)  Labs:  Recent Labs  02/23/14 1915 02/23/14 2353  HGB 10.9*  --   HCT 33.1*  --   PLT 154  --   CREATININE 1.13*  --   TROPONINI 0.31* 0.68*    Estimated Creatinine Clearance: 48.5 ml/min (by C-G formula based on Cr of 1.13).   Medical History: Past Medical History  Diagnosis Date  . Hypertension   . Asthma   . Hyperlipidemia   . Gout, unspecified   . CAD S/P percutaneous coronary angioplasty 01/22/2014    a) (12/2013): POBA - OM2 99% (very tortuous segment) - reduced ~ 50% & TIMI 3 flow, 80-90% stenosis in mid PDA, Irregularities <50% in mRCA, pLAD and prox LCx;; b) 02/17/14: Moderate sized fixed defect in Lateral wall --> cath with OM2 restenosis & otherwise stable --> Xience Alpine DES x 2 (2.25 mm x 12 & 8 mm)  . Chronic systolic heart failure     a. ICM b. ECHO (01/2014): EF 25-30%, grade I DD, trivial MR; b) EF by Myoview 02/17/14 = ~53%  . NSTEMI (non-ST elevated myocardial infarction) 2014; 12/2013  . Environmental allergies   . Pneumonia     "2-3 times" (01/25/2014)  . Hyperthyroidism   . Type II diabetes mellitus   . GERD (gastroesophageal reflux disease)   . Stroke 1990's    "mild", denies residual    Medications:  See electronic med rec  Assessment: 78 y.o. female presents with CP. Recent DES x2  02/17/14. To begin heparin for r/o ACS. Trop up to 0.68. Hgb down a bit from past admit - will watch.  Goal of Therapy:  Heparin level 0.3-0.7 units/ml Monitor platelets by anticoagulation protocol: Yes   Plan:  1. Heparin IV bolus 4000 units 2. Heparin IV gtt at 1000 units/hr 3. Will f/u 6 hr heparin level 4. Daily Heparin level and CBC  Sherlon Handing, PharmD, BCPS Clinical pharmacist, pager [phone] 02/24/2014,1:38 AM

## 2014-02-24 NOTE — H&P (Signed)
Patient ID: Carol [name] MRN: [id number], DOB/AGE: 1935/10/11   Admit date: 02/23/2014   Primary Physician: Lillard Anes, MD Primary Cardiologist: Angelena Form  CC: Chest pain  Problem List  Past Medical History  Diagnosis Date  . Hypertension   . Asthma   . Hyperlipidemia   . Gout, unspecified   . CAD S/P percutaneous coronary angioplasty 01/22/2014    a) (12/2013): POBA - OM2 99% (very tortuous segment) - reduced ~ 50% & TIMI 3 flow, 80-90% stenosis in mid PDA, Irregularities <50% in mRCA, pLAD and prox LCx;; b) 02/17/14: Moderate sized fixed defect in Lateral wall --> cath with OM2 restenosis & otherwise stable --> Xience Alpine DES x 2 (2.25 mm x 12 & 8 mm)  . Chronic systolic heart failure     a. ICM b. ECHO (01/2014): EF 25-30%, grade I DD, trivial MR; b) EF by Myoview 02/17/14 = ~53%  . NSTEMI (non-ST elevated myocardial infarction) 2014; 12/2013  . Environmental allergies   . Pneumonia     "2-3 times" (01/25/2014)  . Hyperthyroidism   . Type II diabetes mellitus   . GERD (gastroesophageal reflux disease)   . Stroke 1990's    "mild", denies residual    Past Surgical History  Procedure Laterality Date  . Shoulder open rotator cuff repair Bilateral 1980's - 1990's  . Total knee arthroplasty Right 2009  . Cesarean section  [id number]; 1960  . Abdominal hysterectomy    . Appendectomy  1959  . Cholecystectomy  1989  . Joint replacement    . Reduction mammaplasty Bilateral 1990's  . Cataract extraction w/ intraocular lens  implant, bilateral Bilateral 2013  . Coronary angioplasty  12/2013    POBA - OM2  . Percutaneous coronary stent intervention (pci-s)  02/17/14    For restenosis of OM2 - PCI with Xience Apline DES 2.25 mm x 12 mm & 2.25 mm x 8 mm overlapping     Allergies  Allergies  Allergen Reactions  . Contrast Media [Iodinated Diagnostic Agents] Other (See Comments)    unknown  . Cortizone-10 [Hydrocortisone] Other (See Comments)    unknown  . Lipitor  [Atorvastatin]     Muscle aches- severe  . Zocor [Simvastatin]     Muscle aches- severe    HPI The patient is a 78 year old woman with HTN, HLD, CAD s/p PCI to OM2 on 02/17/2014 with DES who now presents with chest pain. She had previously presented last month with an NSTEMI but was initially managed conservatively given uncontrolled DM and multiple comorbidities. She underwent Myoview that eventually showed a large lateral defect and so she underwent cardiac cath and PCI on 02/17/2014. She had a 99% OM2 but had nonobstructive CAD in the other vessels.  Since that time she had been otherwise doing well but at about 5pm today she developed substernal chest pain and rigors after a short walk. Since that time she has has worsening nausea and vomiting as well so her family called EMS.  In the ED, initial EKG showed a new LBBB but this resolved quickly. She initially had continual, positional 5/10 chest pain. First cTn was 0.31. She underwent chest CT to exclude PE and PNA given a T 38.4, though her CT was negative. Her second troponin was 0.68 and cardiology was consulted for evaluation and management.   Home Medications  Prior to Admission medications   Medication Sig Start Date End Date Taking? Authorizing Provider  albuterol (PROVENTIL HFA;VENTOLIN HFA) 108 (90 BASE) MCG/ACT  inhaler Inhale 1 puff into the lungs every 6 (six) hours as needed for wheezing or shortness of breath.   Yes Historical Provider, MD  allopurinol (ZYLOPRIM) 300 MG tablet Take 300 mg by mouth daily.   Yes Historical Provider, MD  aspirin EC 81 MG EC tablet Take 1 tablet (81 mg total) by mouth daily. 01/25/14  Yes Rande Brunt, NP  atorvastatin (LIPITOR) 80 MG tablet Take 1 tablet (80 mg total) by mouth daily at 6 PM. 01/25/14  Yes Rande Brunt, NP  Biotin 5000 MCG CAPS Take 1 capsule by mouth daily.   Yes Historical Provider, MD  carvedilol (COREG) 6.25 MG tablet Take 1 tablet (6.25 mg total) by mouth 2 (two) times daily with  a meal. 01/25/14  Yes Rande Brunt, NP  colchicine 0.6 MG tablet Take 1 tablet (0.6 mg total) by mouth 2 (two) times daily. 02/19/14  Yes Thurnell Lose, MD  insulin aspart (NOVOLOG) 100 UNIT/ML injection Before each meal 3 times a day , 140-199 - 4 units, 200-250 - 6 units, 251-299 - 8 units,  300-349 - 10 units,  350 or above 12 units.Insulin PEN if approved, provide syringes and needles if needed. 02/19/14  Yes Thurnell Lose, MD  insulin detemir (LEVEMIR) 100 UNIT/ML injection Inject 0.25 mLs (25 Units total) into the skin 2 (two) times daily. Syringes and needles as needed 02/19/14  Yes Thurnell Lose, MD  isosorbide mononitrate (IMDUR) 30 MG 24 hr tablet Take 1 tablet (30 mg total) by mouth daily. 02/15/14  Yes Rande Brunt, NP  lisinopril (PRINIVIL,ZESTRIL) 2.5 MG tablet Take 1 tablet (2.5 mg total) by mouth daily. 02/19/14  Yes Thurnell Lose, MD  nitroGLYCERIN (NITROSTAT) 0.4 MG SL tablet Place 0.4 mg under the tongue every 5 (five) minutes as needed for chest pain.   Yes Historical Provider, MD  omeprazole (PRILOSEC) 20 MG capsule Take 20 mg by mouth daily.   Yes Historical Provider, MD  thyroid (ARMOUR) 60 MG tablet Take 120 mg by mouth daily. Take two tabs once a day for 90 days (09/04/2012)   Yes Historical Provider, MD  ticagrelor (BRILINTA) 90 MG TABS tablet Take 1 tablet (90 mg total) by mouth 2 (two) times daily. 01/25/14  Yes Rande Brunt, NP  Insulin Syringe-Needle U-100 25G X 1" 1 ML MISC Dispense for taking Levemir and Novolog 1 month supply, if PEN not approved. 02/19/14   Thurnell Lose, MD    Family History  Family History  Problem Relation Age of Onset  . Hypertension Mother   . Diabetes Mother   . Heart attack Mother 34  . Heart disease Father   . Diabetes Brother   . Heart disease Brother     Social History  History   Social History  . Marital Status: Widowed    Spouse Name: N/A    Number of Children: 2  . Years of Education: N/A   Occupational  History  . Retired    Social History Main Topics  . Smoking status: Never Smoker   . Smokeless tobacco: Never Used  . Alcohol Use: No  . Drug Use: No  . Sexual Activity: No   Other Topics Concern  . Not on file   Social History Narrative   Lives in [place] by herself.      Review of Systems General:  +F/C/NS. No recent weight changes.   Cardiovascular:  +CP, no dyspnea on exertion, edema, orthopnea, palpitations, paroxysmal nocturnal  dyspnea. Dermatological: No rash, lesions/masses Respiratory: No cough, dyspnea Urologic: No hematuria, dysuria Abdominal:   No nausea, vomiting, diarrhea, bright red blood per rectum, melena, or hematemesis Neurologic:  No visual changes, wkns, changes in mental status. All other systems reviewed and are otherwise negative except as noted above.  Physical Exam  Blood pressure 100/48, pulse 81, temperature 101.1 F (38.4 C), temperature source Oral, resp. rate 18, height 5' 7.5" (1.715 m), weight 186 lb (84.369 kg), SpO2 96.00%.  General: Pleasant, mildly uncomfortable Psych: Normal affect. Neuro: Alert and oriented X 3. Moves all extremities spontaneously. HEENT: Normal  Neck: Supple without bruits or JVD. No evidence of volume overload. Lungs:  Resp regular and unlabored, CTA. Heart: RRR no s3, s4, or murmurs. Abdomen: Soft, non-tender, non-distended, BS + x 4.  Extremities: No clubbing, cyanosis or edema. DP/PT/Radials 2+ and equal bilaterally.  Labs  Troponin [facility] of Care Test) Recent Labs  02/23/14 1915 02/23/14 2353  TROPONINI 0.31* 0.68*   Lab Results  Component Value Date   WBC 4.8 02/23/2014   HGB 10.9* 02/23/2014   HCT 33.1* 02/23/2014   MCV 87.8 02/23/2014   PLT 154 02/23/2014    Recent Labs Lab 02/23/14 1915  NA 135*  K 4.2  CL 99  CO2 20  BUN 41*  CREATININE 1.13*  CALCIUM 9.1  GLUCOSE 105*   Lab Results  Component Value Date   CHOL 151 01/23/2014   HDL 35* 01/23/2014   LDLCALC 88 01/23/2014   TRIG 142 01/23/2014     No results found for this basename: DDIMER     Radiology/Studies  Dg Chest 2 View  02/23/2014   CLINICAL DATA:  CHEST PAIN  EXAM: CHEST - 2 VIEW  COMPARISON:  01/22/2014  FINDINGS: Relatively low lung volumes. Lungs are clear. Heart size and mediastinal contours are within normal limits. No effusion. Postop change at the distal right clavicle. Small spurs in the mid thoracic spine.  IMPRESSION: No acute cardiopulmonary disease.   Electronically Signed   By: Arne Cleveland M.D.   On: 02/23/2014 20:46   Ct Angio Chest W/cm &/or Wo Cm  02/24/2014   CLINICAL DATA:  CP. fever. Recent MI. CP. fever. Recent MI.  EXAM: CT ANGIOGRAPHY CHEST WITH CONTRAST  TECHNIQUE: Multidetector CT imaging of the chest was performed using the standard protocol during bolus administration of intravenous contrast. Multiplanar CT image reconstructions and MIPs were obtained to evaluate the vascular anatomy. Patient was pre-medicated due to history of contrast allergy, without complication.  CONTRAST:  178mL OMNIPAQUE IOHEXOL 350 MG/ML SOLN  COMPARISON:  01/22/2014  FINDINGS: Satisfactory opacification of pulmonary arteries noted, and there is no evidence of pulmonary emboli. No significant aortic enhancement ; No evidence of aneurysm . Patchy aortic arch and coronary calcifications. No pleural or pericardial effusion. No hilar or mediastinal adenopathy. Moderate hiatal hernia involving gastric fundus. Minimal subsegmental/dependent atelectasis posteriorly in the lower lobes. Lungs are otherwise clear. Spurring at multiple contiguous levels in the mid and lower thoracic spine. Scattered small calcified granulomas in the spleen. Remainder visualized upper abdomen unremarkable.  Review of the MIP images confirms the above findings.  IMPRESSION: 1. Negative for acute PE. 2. Atherosclerosis, including coronary artery disease. Please note that although the presence of coronary artery calcium documents the presence of coronary artery  disease, the severity of this disease and any potential stenosis cannot be assessed on this non-gated CT examination. Assessment for potential risk factor modification, dietary therapy or pharmacologic therapy may be warranted,  if clinically indicated. 3. Hiatal hernia.   Electronically Signed   By: Arne Cleveland M.D.   On: 02/24/2014 01:10   Nm Myocar Multi W/spect W/wall Motion / Ef  02/17/2014   CLINICAL DATA:  Chest pain. Diaphoresis. History of coronary artery disease.  EXAM: MYOCARDIAL IMAGING WITH SPECT (REST AND PHARMACOLOGIC-STRESS)  GATED LEFT VENTRICULAR WALL MOTION STUDY  LEFT VENTRICULAR EJECTION FRACTION  TECHNIQUE: Standard myocardial SPECT imaging was performed after resting intravenous injection of 10 mCi Tc-94m sestamibi. Subsequently, intravenous infusion of Lexiscan was performed under the supervision of the Cardiology staff. At peak effect of the drug, 30 mCi Tc-42m sestamibi was injected intravenously and standard myocardial SPECT imaging was performed. Quantitative gated imaging was also performed to evaluate left ventricular wall motion, and estimate left ventricular ejection fraction.  COMPARISON:  Myocardial perfusion study 12/25/2012. CT 01/22/2014. Echocardiogram 01/23/2014 and 12/17/2012.  FINDINGS: Perfusion: Fixed area decreased perfusion is present in the LEFT lateral wall. This severe area of decreased perfusion is moderate-sized in the mid lateral wall. Mild hypokinesia of the lateral wall. Compared to prior study of 12/24/2012, this defect is new and compatible with interval infarct. There is a small area of decreased perfusion on stress images in the inferior near the apex anterior wall on stress images which appears normal on rest images.  Wall Motion: Mild LEFT lateral wall hypokinesia.  Left Ventricular Ejection Fraction: 63 %  End diastolic volume 41 mL ml  End systolic volume 15 mL ml  IMPRESSION: 1. New LEFT lateral wall infarction. Small area of mildly decreased  perfusion in the inferior anterior wall near the apex.  2. Mild hypokinesia associated with LEFT lateral wall infarction.  3. Left ventricular ejection fraction 63%. This is discrepant when comparing to echocardiogram may 01/23/2014 which reported ejection fraction of 25-30%. This is similar to the ejection fraction reported on prior echocardiogram 12/17/2012.  4. Intermediate-risk stress test findings*.  *2012 Appropriate Use Criteria for Coronary Revascularization Focused Update: J Am Coll Cardiol. [id number]. http://content.airportbarriers.com.aspx?articleid=1201161   Electronically Signed   By: Dereck Ligas M.D.   On: 02/17/2014 12:12    ECG Initial EKG revealed NSR at 92bpm and new LBBB. Subsequent rhythm strip revealed NSR with resolution of LBBB.  Echocardiogram: Study Conclusions  - Left ventricle: The cavity size was normal. Wall thickness was normal. Systolic function was severely reduced. The estimated ejection fraction was in the range of 25% to 30%. The mid to apical anteroseptal and anterior walls, apical lateral wall, apical inferior wall, and true apex were all akinetic. Doppler parameters are consistent with abnormal left ventricular relaxation (grade 1 diastolic dysfunction). - Aortic valve: There was no stenosis. - Mitral valve: Mildly calcified annulus. There was trivial regurgitation. - Left atrium: The atrium was mildly dilated. - Pulmonary arteries: No complete TR doppler jet so unable to estimate PA systolic pressure. - Systemic veins: IVC measured 2.6 cm with < 50% respirophasic variation, suggesting RA pressure 15 mmHg.  Impressions:  - Normal LV size with EF 25-30%. Wall motion abnormalities suggest LAD-territory infarction. Normal RV size and systolic function. No significant valvular abnormalities. Dilated IVC suggests elevated RV filling pressure.   ASSESSMENT AND PLAN The patient is a 78 year old woman with HTN, HLD, CAD s/p PCI to OM2 on  02/17/2014 with DES who now presents with chest pain. cTn now positive, consistent with NSTEMI:  1. NSTEMI: She had recent PCI with DES to her OM2. The main concern is possible early stent thrombosis. She also had  other nonobstructive disease so she could also have new NSTEMI with the culprit being the non-IRA from last week. She states that she has been taking her ticagrelor regularly without interruption. Interestingly, her pain is sharp and positional. Other possibilities include pericarditis (Dressler's syndrome) or some other inflammatory process. However, the risk of stent thrombosis is sufficiently high to exclude definitively. Bivalirudin was not used during her index PCI procedure -ASA 81mg  -Heparin gtt per protocol -Ticagrelor 90mg  bid -continue BB, ACEi, statin -consider cath in AM. Of note she had a CT with contrast today so would be careful to subject her to two dye loads in a short time period. -Can use NTG gtt if she develops additional chest pain.  2. Fever: Unclear etiology. Unlikely to be PNA after negative CXR and CT chest.  -check BCx,x 2, UA with culture -no indication for Abx at this time until source found  3. Chronic systolic heart failure: She has known ischemic cardiomyopathy and low EF. She is euvolemic on examination today with no evidence of volume overload -continue ACEi, BB -daily weights, strict I/O  4. DM2: continue home regimen of levemir and aspart though would be careful with SSI given that she is NPO for possible cath in AM.  FULL CODE  Signed, Raliegh Ip, MD MPH 02/24/2014, 1:49 AM

## 2014-02-24 NOTE — H&P (View-Only) (Signed)
Patient Name: Carol [name] Date of Encounter: 02/24/2014  Principal Problem:   NSTEMI (non-ST elevated myocardial infarction) Active Problems:   Type II diabetes mellitus with complication, uncontrolled   Essential hypertension, benign   Hyperlipidemia with target LDL less than 70   CKD stage 3 due to type 2 diabetes mellitus    Patient Profile: 78 year old woman with HTN, HLD, DM type 2, NSTEMI in 2014 and 2015, CAD s/p PCI to OM2 on 02/17/2014 with DES who was admitted on 02/23/14 for chest pain.   SUBJECTIVE: No chest pain or SOB over night. No palpitations or presyncopal symptoms. States that her right foot is bothering her. She was treated for gout last week, but the pain never fully resolved. States that the pain starts in her right great toe and occasionally shoots up the dorsum of her foot. No new injury and states that it feels like her gout pain.   OBJECTIVE Filed Vitals:   02/24/14 0115 02/24/14 0145 02/24/14 0215 02/24/14 0240  BP: 95/46 100/45 105/49 117/48  Pulse: 76 77 76 77  Temp:    97.8 F (36.6 C)  TempSrc:    Oral  Resp: 18 17 15 18   Height:    5' 7.5" (1.715 m)  Weight:    184 lb 15.5 oz (83.9 kg)  SpO2: 96% 95% 97% 97%    Intake/Output Summary (Last 24 hours) at 02/24/14 0831 Last data filed at 02/24/14 0700  Gross per 24 hour  Intake      0 ml  Output    900 ml  Net   -900 ml   Filed Weights   02/24/14 0100 02/24/14 0240  Weight: 186 lb (84.369 kg) 184 lb 15.5 oz (83.9 kg)    PHYSICAL EXAM General: Well developed, well nourished, obese female in no acute distress. Head: Normocephalic, atraumatic.  Neck: Supple without bruits, JVD not elevated. Lungs:  Resp regular and unlabored, CTA. Heart: RRR, S1, S2, no S3, S4, or murmur; no rub. Abdomen: Soft, non-tender, non-distended, BS + x 4.  Extremities: No clubbing, cyanosis, edema.  Neuro: Alert and oriented X 3. Moves all extremities spontaneously. Psych: Normal  affect.  LABS: CBC:  Recent Labs  02/23/14 1915 02/24/14 0259  WBC 4.8 11.2*  NEUTROABS 4.3  --   HGB 10.9* 10.3*  HCT 33.1* 31.7*  MCV 87.8 88.8  PLT 154 0000000   Basic Metabolic Panel:  Recent Labs  02/23/14 1915 02/24/14 0259  NA 135* 136*  K 4.2 4.9  CL 99 104  CO2 20 19  GLUCOSE 105* 183*  BUN 41* 36*  CREATININE 1.13* 1.15*  CALCIUM 9.1 8.6  MG  --  2.1   Liver Function Tests: OK in August  Cardiac Enzymes:  Recent Labs  02/23/14 1915 02/23/14 2353 02/24/14 0259  TROPONINI 0.31* 0.68* 0.46*   Hemoglobin A1C: was 9.1 in July  Fasting Lipid Panel:  Recent Labs  02/24/14 0259  CHOL 84  HDL 29*  LDLCALC 46  TRIG 45  CHOLHDL 2.9   TELE: NSR with occasional PVC  ECG:   Radiology/Studies: Dg Chest 2 View 02/23/2014   CLINICAL DATA:  CHEST PAIN  EXAM: CHEST - 2 VIEW  COMPARISON:  01/22/2014  FINDINGS: Relatively low lung volumes. Lungs are clear. Heart size and mediastinal contours are within normal limits. No effusion. Postop change at the distal right clavicle. Small spurs in the mid thoracic spine.  IMPRESSION: No acute cardiopulmonary disease.   Electronically  Signed   By: Arne Cleveland M.D.   On: 02/23/2014 20:46   Ct Angio Chest W/cm &/or Wo Cm 02/24/2014   CLINICAL DATA:  CP. fever. Recent MI. CP. fever. Recent MI.  EXAM: CT ANGIOGRAPHY CHEST WITH CONTRAST  TECHNIQUE: Multidetector CT imaging of the chest was performed using the standard protocol during bolus administration of intravenous contrast. Multiplanar CT image reconstructions and MIPs were obtained to evaluate the vascular anatomy. Patient was pre-medicated due to history of contrast allergy, without complication.  CONTRAST:  18mL OMNIPAQUE IOHEXOL 350 MG/ML SOLN  COMPARISON:  01/22/2014  FINDINGS: Satisfactory opacification of pulmonary arteries noted, and there is no evidence of pulmonary emboli. No significant aortic enhancement ; No evidence of aneurysm . Patchy aortic arch and coronary  calcifications. No pleural or pericardial effusion. No hilar or mediastinal adenopathy. Moderate hiatal hernia involving gastric fundus. Minimal subsegmental/dependent atelectasis posteriorly in the lower lobes. Lungs are otherwise clear. Spurring at multiple contiguous levels in the mid and lower thoracic spine. Scattered small calcified granulomas in the spleen. Remainder visualized upper abdomen unremarkable.  Review of the MIP images confirms the above findings.  IMPRESSION: 1. Negative for acute PE. 2. Atherosclerosis, including coronary artery disease. Please note that although the presence of coronary artery calcium documents the presence of coronary artery disease, the severity of this disease and any potential stenosis cannot be assessed on this non-gated CT examination. Assessment for potential risk factor modification, dietary therapy or pharmacologic therapy may be warranted, if clinically indicated. 3. Hiatal hernia.   Electronically Signed   By: Arne Cleveland M.D.   On: 02/24/2014 01:10     Current Medications:  . allopurinol  300 mg Oral Daily  . aspirin EC  81 mg Oral Daily  . atorvastatin  80 mg Oral q1800  . carvedilol  6.25 mg Oral BID WC  . colchicine  0.6 mg Oral BID  . insulin aspart  0-12 Units Subcutaneous TID WC  . [START ON 02/25/2014] insulin detemir  25 Units Subcutaneous BID  . lisinopril  2.5 mg Oral Daily  . pantoprazole  40 mg Oral Daily  . thyroid  120 mg Oral Daily  . ticagrelor  90 mg Oral BID   . sodium chloride    . heparin 850 Units/hr (02/24/14 0507)    ASSESSMENT AND PLAN: Principal Problem:   NSTEMI (non-ST elevated myocardial infarction) - On ASA, statin, BB, ACEI, Brilenta. Imdur on hold, Nitro PRN for chest pain. Clear liquids for a LHC +/- PCI with Dr. Angelena Form today.   Active Problems:   Type II diabetes mellitus with complication, uncontrolled: Levemir on hold until after the heart cath. Resume home regimen of levimir and aspart. Use SSI  today    Essential hypertension, benign: well controlled here. Contine home medications and monitor. Holding ACEI for cath. If BP remains low, may have to decrease Coreg.     Hyperlipidemia with target LDL less than 70: well controlled here, continue statin    CKD stage 3 due to type 2 diabetes mellitus: creatine is 1.15, standard 1 ml/kg fluids pre-cath, hold ACE  Foot pain: Thought to be gout. On colchicine 0.6mg  BID and Allopurinol once daily. Will closely follow.  Hypothyroidism: Well controlled, TSH and T4 WNL on 01/22/14. Continue Armour 120 mg QD.    Dye allergy - Rec'd IV Solu-Medrol and Benadryl last pm for CTA chest. Discussed with pharmacy, they recommend giving another dose Solu-Medrol, plus Benadryl and Pepcid pre-cath. Ordered.  Signed,  Gilford Rile  Schott PA-S2  Rosaria Ferries , PA-C 8:31 AM 02/24/2014  I have personally seen and examined this patient with Rosaria Ferries, PA-C. I agree with the assessment and plan as outlined above. Plan for left heart cath today for coronary angiogram, re-look at stent and assess PDA stenosis.   Isaia Hassell 02/24/2014 1:34 PM

## 2014-02-24 NOTE — Care Management Note (Addendum)
  Page 2 of 2   02/25/2014     11:44:48 AM CARE MANAGEMENT NOTE 02/25/2014  Patient:  Carol [name], Carol [name]   Account Number:  [id number]  Date Initiated:  02/24/2014  Documentation initiated by:  Quorra Rosene  Subjective/Objective Assessment:   NSTEMI     Action/Plan:   CM to follow for dispostion needs   Anticipated DC Date:  02/26/2014   Anticipated DC Plan:  [place]  CM consult  Medication Assistance      Choice offered to / List presented to:             Status of service:  Completed, signed off Medicare Important Message given?   (If response is "NO", the following Medicare IM given date fields will be blank) Date Medicare IM given:   Medicare IM given by:   Date Additional Medicare IM given:   Additional Medicare IM given by:    Discharge Disposition:  HOME/SELF CARE  Per UR Regulation:  Reviewed for med. necessity/level of care/duration of stay  If discussed at [place] of Stay Meetings, dates discussed:    Comments:  Yomaris Palecek RN, BSN, MSHL, CCM  Nurse - Case Manager,  (Unit [phone]  02/25/2014 Social:  from home with supportive family.  Denies any falls / safety concerns over past year. Transportation:  Family and self for near trips Medications:  Compliant; denies missing meds, refills or remembering to take meds PCP:  Dr. Reinaldo Meeker HHS:  Denies need for HHS and CM identifies no needs. Brilinta:  Patient actively taking and has used 30 day free card in the past.  Fills at [facility] and has med available at home to resume until refill.  CM reminded to notify pharmacy of early request for refill so pharmacy can order med. Dispositon:  Home self care.   Sharnise Blough RN, BSN, MSHL, CCM  Nurse - Case Manager,  (Unit [phone]  02/24/2014 Benefits Check Request: ticagrelor (BRILINTA) tablet 90 mg  :  Dose 90 mg  :  Oral :  2 times daily PT COPAY WILL BE $45-PRIOR AUTH IS NOT [place], [place] - [id number] [place]. (Med n/a without special request to order)

## 2014-02-24 NOTE — Progress Notes (Signed)
Patient Name: ISSIS HASSING Date of Encounter: 02/24/2014  Principal Problem:   NSTEMI (non-ST elevated myocardial infarction) Active Problems:   Type II diabetes mellitus with complication, uncontrolled   Essential hypertension, benign   Hyperlipidemia with target LDL less than 70   CKD stage 3 due to type 2 diabetes mellitus    Patient Profile: 78 year old woman with HTN, HLD, DM type 2, NSTEMI in 2014 and 2015, CAD s/p PCI to OM2 on 02/17/2014 with DES who was admitted on 02/23/14 for chest pain.   SUBJECTIVE: No chest pain or SOB over night. No palpitations or presyncopal symptoms. States that her right foot is bothering her. She was treated for gout last week, but the pain never fully resolved. States that the pain starts in her right great toe and occasionally shoots up the dorsum of her foot. No new injury and states that it feels like her gout pain.   OBJECTIVE Filed Vitals:   02/24/14 0115 02/24/14 0145 02/24/14 0215 02/24/14 0240  BP: 95/46 100/45 105/49 117/48  Pulse: 76 77 76 77  Temp:    97.8 F (36.6 C)  TempSrc:    Oral  Resp: 18 17 15 18   Height:    5' 7.5" (1.715 m)  Weight:    184 lb 15.5 oz (83.9 kg)  SpO2: 96% 95% 97% 97%    Intake/Output Summary (Last 24 hours) at 02/24/14 0831 Last data filed at 02/24/14 0700  Gross per 24 hour  Intake      0 ml  Output    900 ml  Net   -900 ml   Filed Weights   02/24/14 0100 02/24/14 0240  Weight: 186 lb (84.369 kg) 184 lb 15.5 oz (83.9 kg)    PHYSICAL EXAM General: Well developed, well nourished, obese female in no acute distress. Head: Normocephalic, atraumatic.  Neck: Supple without bruits, JVD not elevated. Lungs:  Resp regular and unlabored, CTA. Heart: RRR, S1, S2, no S3, S4, or murmur; no rub. Abdomen: Soft, non-tender, non-distended, BS + x 4.  Extremities: No clubbing, cyanosis, edema.  Neuro: Alert and oriented X 3. Moves all extremities spontaneously. Psych: Normal  affect.  LABS: CBC:  Recent Labs  02/23/14 1915 02/24/14 0259  WBC 4.8 11.2*  NEUTROABS 4.3  --   HGB 10.9* 10.3*  HCT 33.1* 31.7*  MCV 87.8 88.8  PLT 154 0000000   Basic Metabolic Panel:  Recent Labs  02/23/14 1915 02/24/14 0259  NA 135* 136*  K 4.2 4.9  CL 99 104  CO2 20 19  GLUCOSE 105* 183*  BUN 41* 36*  CREATININE 1.13* 1.15*  CALCIUM 9.1 8.6  MG  --  2.1   Liver Function Tests: OK in August  Cardiac Enzymes:  Recent Labs  02/23/14 1915 02/23/14 2353 02/24/14 0259  TROPONINI 0.31* 0.68* 0.46*   Hemoglobin A1C: was 9.1 in July  Fasting Lipid Panel:  Recent Labs  02/24/14 0259  CHOL 84  HDL 29*  LDLCALC 46  TRIG 45  CHOLHDL 2.9   TELE: NSR with occasional PVC  ECG:   Radiology/Studies: Dg Chest 2 View 02/23/2014   CLINICAL DATA:  CHEST PAIN  EXAM: CHEST - 2 VIEW  COMPARISON:  01/22/2014  FINDINGS: Relatively low lung volumes. Lungs are clear. Heart size and mediastinal contours are within normal limits. No effusion. Postop change at the distal right clavicle. Small spurs in the mid thoracic spine.  IMPRESSION: No acute cardiopulmonary disease.   Electronically  Signed   By: Arne Cleveland M.D.   On: 02/23/2014 20:46   Ct Angio Chest W/cm &/or Wo Cm 02/24/2014   CLINICAL DATA:  CP. fever. Recent MI. CP. fever. Recent MI.  EXAM: CT ANGIOGRAPHY CHEST WITH CONTRAST  TECHNIQUE: Multidetector CT imaging of the chest was performed using the standard protocol during bolus administration of intravenous contrast. Multiplanar CT image reconstructions and MIPs were obtained to evaluate the vascular anatomy. Patient was pre-medicated due to history of contrast allergy, without complication.  CONTRAST:  142mL OMNIPAQUE IOHEXOL 350 MG/ML SOLN  COMPARISON:  01/22/2014  FINDINGS: Satisfactory opacification of pulmonary arteries noted, and there is no evidence of pulmonary emboli. No significant aortic enhancement ; No evidence of aneurysm . Patchy aortic arch and coronary  calcifications. No pleural or pericardial effusion. No hilar or mediastinal adenopathy. Moderate hiatal hernia involving gastric fundus. Minimal subsegmental/dependent atelectasis posteriorly in the lower lobes. Lungs are otherwise clear. Spurring at multiple contiguous levels in the mid and lower thoracic spine. Scattered small calcified granulomas in the spleen. Remainder visualized upper abdomen unremarkable.  Review of the MIP images confirms the above findings.  IMPRESSION: 1. Negative for acute PE. 2. Atherosclerosis, including coronary artery disease. Please note that although the presence of coronary artery calcium documents the presence of coronary artery disease, the severity of this disease and any potential stenosis cannot be assessed on this non-gated CT examination. Assessment for potential risk factor modification, dietary therapy or pharmacologic therapy may be warranted, if clinically indicated. 3. Hiatal hernia.   Electronically Signed   By: Arne Cleveland M.D.   On: 02/24/2014 01:10     Current Medications:  . allopurinol  300 mg Oral Daily  . aspirin EC  81 mg Oral Daily  . atorvastatin  80 mg Oral q1800  . carvedilol  6.25 mg Oral BID WC  . colchicine  0.6 mg Oral BID  . insulin aspart  0-12 Units Subcutaneous TID WC  . [START ON 02/25/2014] insulin detemir  25 Units Subcutaneous BID  . lisinopril  2.5 mg Oral Daily  . pantoprazole  40 mg Oral Daily  . thyroid  120 mg Oral Daily  . ticagrelor  90 mg Oral BID   . sodium chloride    . heparin 850 Units/hr (02/24/14 0507)    ASSESSMENT AND PLAN: Principal Problem:   NSTEMI (non-ST elevated myocardial infarction) - On ASA, statin, BB, ACEI, Brilenta. Imdur on hold, Nitro PRN for chest pain. Clear liquids for a LHC +/- PCI with Dr. Angelena Form today.   Active Problems:   Type II diabetes mellitus with complication, uncontrolled: Levemir on hold until after the heart cath. Resume home regimen of levimir and aspart. Use SSI  today    Essential hypertension, benign: well controlled here. Contine home medications and monitor. Holding ACEI for cath. If BP remains low, may have to decrease Coreg.     Hyperlipidemia with target LDL less than 70: well controlled here, continue statin    CKD stage 3 due to type 2 diabetes mellitus: creatine is 1.15, standard 1 ml/kg fluids pre-cath, hold ACE  Foot pain: Thought to be gout. On colchicine 0.6mg  BID and Allopurinol once daily. Will closely follow.  Hypothyroidism: Well controlled, TSH and T4 WNL on 01/22/14. Continue Armour 120 mg QD.    Dye allergy - Rec'd IV Solu-Medrol and Benadryl last pm for CTA chest. Discussed with pharmacy, they recommend giving another dose Solu-Medrol, plus Benadryl and Pepcid pre-cath. Ordered.  Signed,  Gilford Rile  Schott PA-S2  Rosaria Ferries , PA-C 8:31 AM 02/24/2014  I have personally seen and examined this patient with Rosaria Ferries, PA-C. I agree with the assessment and plan as outlined above. Plan for left heart cath today for coronary angiogram, re-look at stent and assess PDA stenosis.   Alika Saladin 02/24/2014 1:34 PM

## 2014-02-24 NOTE — CV Procedure (Signed)
Cardiac Catheterization Operative Report  KARMIN COLAO [id number] 9/2/20152:49 PM PERRY,LAWRENCE Percell Miller, MD  Procedure Performed:  1. Left Heart Catheterization 2. Selective Coronary Angiography 3. Left ventricular angiogram 4. PTCA/balloon angioplasty only PDA  Operator: Lauree Chandler, MD  Arterial access site:  Right radial artery.   Indication: 78 yo female with history of DM, HTN, HLD and CAD admitted with unstable angina/NSTEMI. She has had two recent admissions with chest pain. Admitted with NSTEMI with cath 01/22/14 showing severe stenosis in the OM2. Balloon angioplasty only with no stent placement at that time. She had ongoing chest pain after that and was readmitted end of August 2015. Cardiac cath 02/17/14 and the OM2 lesion had restenosed. I placed two overlapping drug eluting stents in the OM2. She was noted at the time of that cath to have severe stenosis in the small caliber PDA but I elected to treat it medically. Now readmitted with slight elevation of troponin and episode of chest pain.                                    Procedure Details: The risks, benefits, complications, treatment options, and expected outcomes were discussed with the patient. The patient and/or family concurred with the proposed plan, giving informed consent. The patient was brought to the cath lab after IV hydration was begun and oral premedication was given. The patient was further sedated with Versed and Fentanyl. The right wrist was assessed with a reverse Allens test which was positive. The right wrist was prepped and draped in a sterile fashion. 1% lidocaine was used for local anesthesia. Using the modified Seldinger access technique, a 5 French sheath was placed in the right radial artery. 3 mg Verapamil was given through the sheath. 4500 units IV heparin was given. Standard diagnostic catheters were used to perform selective coronary angiography. A pigtail catheter was used to  perform a left ventricular angiogram. Her stents in the second OM were noted to be patent without restenosis. The lesion was still present in the small caliber PDA. Given her elevated troponin and unstable angina, I elected to proceed to PCI of the small caliber PDA with intention of balloon angioplasty only.   PCI Note: She was given an additional 5500 units IV heparin. She has been on dual anti-platelet therapy so no new agent was loaded. (She has been on Brilinta). I engaged the RCA with a JR4 guiding catheter. ACT was 275 so an additional 2000 units IV heparin was given. A Cougar IC wire was advanced down the RCA into the PDA branch. I then inflated a 1.5 x 6 mm balloon in the area of tightest stenosis in the PDA. The balloon was inflated for one minute. Angiography demonstrated an excellent result with reduction in stenosis from 99% down to 10%. The sheath was removed from the right radial artery and a Terumo hemostasis band was applied at the arteriotomy site on the right wrist. There were no immediate complications. The patient was taken to the recovery area in stable condition.   Hemodynamic Findings: Central aortic pressure: 112/46 Left ventricular pressure: 117/10/21  Angiographic Findings:  Left main: 20% mid stenosis.   Left Anterior Descending Artery: Large caliber vessel that courses to the apex. The proximal vessel has a 40-50% stenosis. The mid vessel has a focal 30% stenosis. The diagonal branch is small caliber with diffuse mild plaque.   Circumflex Artery:  Moderate caliber vessel with a very small caliber intermediate branch, a very small caliber first OM branch and a moderate calliber second obtuse marginal branch. The proximal Circumflex has a focal 30% stenosis. The intermediate branch is very small in caliber with proximal 50% stenosis. The second obtuse marginal branch has patent stents with no restenosis. There appears to be bridging in the proximal OM2 just before the stent,  likely as a result of straightening of the vessel with the stents. The mid AV groove Circumflex has a 40% stenosis beyond the takeoff of the second OM branch.   Right Coronary Artery: Large caliber dominant vessel with small caliber PDA. The proximal vessel has a 30% stenosis. The mid vessel has diffuse 40% stenosis. The distal vessel tapers to a small caliber vessel. The PDA is a 1.5 mm vessel. There is a 99% stenosis in the mid PDA.   Left Ventricular Angiogram: LVEF=50-55%.   Impression: 1. Double vessel CAD with patent stents OM2, severe stenosis small caliber PDA 2. Preserved LV systolic function 3. Successful PTCA/balloon angioplasty PDA (no stent placed due to small vessel size) 4. Bridging/kinking of vessel in the tortuous OM2 just before the stented segment.   Recommendations: Continue medical management with beta blocker, statin, ASA, Brilinta. Will restart Imdur.        Complications:  None. The patient tolerated the procedure well.

## 2014-02-24 NOTE — ED Notes (Signed)
MD Wilson Singer made aware the patient's family would like to speak with him about Patient's Blood work.

## 2014-02-24 NOTE — Interval H&P Note (Signed)
History and Physical Interval Note:  02/24/2014 1:37 PM  [name]  has presented today for cardiac cath with the diagnosis of cp/NSTEMI  The various methods of treatment have been discussed with the patient and family. After consideration of risks, benefits and other options for treatment, the patient has consented to  Procedure(s): LEFT HEART CATHETERIZATION WITH CORONARY ANGIOGRAM (N/A) as a surgical intervention .  The patient's history has been reviewed, patient examined, no change in status, stable for surgery.  I have reviewed the patient's chart and labs.  Questions were answered to the patient's satisfaction.    Cath Lab Visit (complete for each Cath Lab visit)  Clinical Evaluation Leading to the Procedure:   ACS: No.  Non-ACS:    Anginal Classification: CCS IV  Anti-ischemic medical therapy: Maximal Therapy (2 or more classes of medications)  Non-Invasive Test Results: No non-invasive testing performed  Prior CABG: No previous CABG       Carol [name]

## 2014-02-24 NOTE — ED Notes (Signed)
Spoke with lab about status of troponin.  Stated troponin would be resulted in about 10 minutes.

## 2014-02-24 NOTE — ED Notes (Signed)
Pt reporting chest pain.  Pain currently 5/10.  EDP made aware.  Awaiting new orders.

## 2014-02-25 ENCOUNTER — Encounter (HOSPITAL_COMMUNITY): Payer: Self-pay | Admitting: Physician Assistant

## 2014-02-25 ENCOUNTER — Other Ambulatory Visit: Payer: Self-pay | Admitting: Physician Assistant

## 2014-02-25 DIAGNOSIS — R7309 Other abnormal glucose: Secondary | ICD-10-CM

## 2014-02-25 DIAGNOSIS — I2510 Atherosclerotic heart disease of native coronary artery without angina pectoris: Secondary | ICD-10-CM

## 2014-02-25 DIAGNOSIS — Z9861 Coronary angioplasty status: Secondary | ICD-10-CM

## 2014-02-25 DIAGNOSIS — E1129 Type 2 diabetes mellitus with other diabetic kidney complication: Secondary | ICD-10-CM

## 2014-02-25 DIAGNOSIS — I209 Angina pectoris, unspecified: Secondary | ICD-10-CM

## 2014-02-25 DIAGNOSIS — I10 Essential (primary) hypertension: Secondary | ICD-10-CM

## 2014-02-25 DIAGNOSIS — E118 Type 2 diabetes mellitus with unspecified complications: Secondary | ICD-10-CM

## 2014-02-25 DIAGNOSIS — E1165 Type 2 diabetes mellitus with hyperglycemia: Secondary | ICD-10-CM

## 2014-02-25 DIAGNOSIS — I214 Non-ST elevation (NSTEMI) myocardial infarction: Secondary | ICD-10-CM

## 2014-02-25 DIAGNOSIS — I2589 Other forms of chronic ischemic heart disease: Secondary | ICD-10-CM

## 2014-02-25 DIAGNOSIS — N183 Chronic kidney disease, stage 3 unspecified: Secondary | ICD-10-CM

## 2014-02-25 DIAGNOSIS — IMO0002 Reserved for concepts with insufficient information to code with codable children: Secondary | ICD-10-CM

## 2014-02-25 DIAGNOSIS — E785 Hyperlipidemia, unspecified: Secondary | ICD-10-CM

## 2014-02-25 LAB — BASIC METABOLIC PANEL
ANION GAP: 13 (ref 5–15)
BUN: 37 mg/dL — AB (ref 6–23)
CO2: 19 mEq/L (ref 19–32)
Calcium: 8.8 mg/dL (ref 8.4–10.5)
Chloride: 105 mEq/L (ref 96–112)
Creatinine, Ser: 1.1 mg/dL (ref 0.50–1.10)
GFR calc Af Amer: 55 mL/min — ABNORMAL LOW (ref >90)
GFR, EST NON AFRICAN AMERICAN: 48 mL/min — AB (ref >90)
Glucose, Bld: 302 mg/dL — ABNORMAL HIGH (ref 70–99)
Potassium: 4.8 mEq/L (ref 3.7–5.3)
SODIUM: 137 meq/L (ref 137–147)

## 2014-02-25 LAB — GLUCOSE, CAPILLARY: GLUCOSE-CAPILLARY: 300 mg/dL — AB (ref 70–99)

## 2014-02-25 LAB — CBC
HEMATOCRIT: 30.4 % — AB (ref 36.0–46.0)
Hemoglobin: 9.7 g/dL — ABNORMAL LOW (ref 12.0–15.0)
MCH: 28.1 pg (ref 26.0–34.0)
MCHC: 31.9 g/dL (ref 30.0–36.0)
MCV: 88.1 fL (ref 78.0–100.0)
PLATELETS: 179 10*3/uL (ref 150–400)
RBC: 3.45 MIL/uL — ABNORMAL LOW (ref 3.87–5.11)
RDW: 17 % — AB (ref 11.5–15.5)
WBC: 11.1 10*3/uL — AB (ref 4.0–10.5)

## 2014-02-25 MED ORDER — LIVING WELL WITH DIABETES BOOK
Freq: Once | Status: AC
  Administered 2014-02-25: 03:00:00
  Filled 2014-02-25: qty 1

## 2014-02-25 MED ORDER — NON FORMULARY: 10.0000 mg | Freq: Every evening | Status: DC

## 2014-02-25 MED ORDER — CARVEDILOL 3.125 MG PO TABS
3.1250 mg | ORAL_TABLET | Freq: Two times a day (BID) | ORAL | Status: DC
  Administered 2014-02-25: 10:00:00 3.125 mg via ORAL
  Filled 2014-02-25: qty 1

## 2014-02-25 MED ORDER — CARVEDILOL 3.125 MG PO TABS: 3.1250 mg | ORAL_TABLET | Freq: Two times a day (BID) | ORAL | Status: DC

## 2014-02-25 MED ORDER — ROSUVASTATIN CALCIUM 10 MG PO TABS
10.0000 mg | ORAL_TABLET | Freq: Every day | ORAL | Status: DC
  Filled 2014-02-25: qty 1

## 2014-02-25 MED ORDER — ROSUVASTATIN CALCIUM 10 MG PO TABS: 10.0000 mg | ORAL_TABLET | Freq: Every evening | ORAL | Status: DC

## 2014-02-25 NOTE — Progress Notes (Signed)
Pt seen & examined.   Looks good & no recurrent angina. OK for d/c.  Discussed plan with Mrs. Dunn, PA-C.  Has ROV appt scheduled.  Leonie Man, M.D., M.S. Interventional Cardiologist   Pager # [phone] 02/25/2014

## 2014-02-25 NOTE — Progress Notes (Signed)
Patient: Carol [name] / Admit Date: 02/23/2014 / Date of Encounter: 02/25/2014, 7:24 AM   Subjective: No chest pain or SOB. "I feel great." Denies any significant bleeding other than prior bruising from previous cath that is in early stages of resolution. No GI/urinary bleeding.   Objective: Telemetry: NSR/SB occ PVCs Physical Exam: Blood pressure 103/50, pulse 64, temperature 97.4 F (36.3 C), temperature source Axillary, resp. rate 18, height 5' 7.5" (1.715 m), weight 182 lb 12.2 oz (82.9 kg), SpO2 96.00%. General: Well developed, well nourished WF in no acute distress. Head: Normocephalic, atraumatic, sclera non-icteric, no xanthomas, nares are without discharge. Neck: Negative for carotid bruits. JVP not elevated. Lungs: Clear bilaterally to auscultation without wheezes, rales, or rhonchi. Breathing is unlabored. Heart: RRR S1 S2 without murmurs, rubs, or gallops.  Abdomen: Soft, non-tender, non-distended with normoactive bowel sounds. No rebound/guarding. Extremities: No clubbing or cyanosis. No edema. Distal pedal pulses are 2+ and equal bilaterally. R radial cath site with good pulse, no hematoma but older-appearing ecchymosis that runs on the inside of her forearm up to her elbow, nontender Neuro: Alert and oriented X 3. Moves all extremities spontaneously. Psych:  Responds to questions appropriately with a normal affect.   Intake/Output Summary (Last 24 hours) at 02/25/14 0724 Last data filed at 02/25/14 0400  Gross per 24 hour  Intake 1081.25 ml  Output   3000 ml  Net -1918.75 ml    Inpatient Medications:  . allopurinol  300 mg Oral Daily  . aspirin EC  81 mg Oral Daily  . atorvastatin  80 mg Oral q1800  . carvedilol  6.25 mg Oral BID WC  . colchicine  0.6 mg Oral BID  . insulin aspart  0-12 Units Subcutaneous TID WC  . insulin detemir  25 Units Subcutaneous BID  . isosorbide mononitrate  30 mg Oral Daily  . lisinopril  2.5 mg Oral Daily  . pantoprazole  40 mg Oral  Daily  . thyroid  120 mg Oral Daily  . ticagrelor  90 mg Oral BID   Infusions:  . sodium chloride      Labs:  Recent Labs  02/24/14 0259 02/25/14 0414  NA 136* 137  K 4.9 4.8  CL 104 105  CO2 19 19  GLUCOSE 183* 302*  BUN 36* 37*  CREATININE 1.15* 1.10  CALCIUM 8.6 8.8  MG 2.1  --     Recent Labs  02/23/14 1915 02/24/14 0259 02/25/14 0414  WBC 4.8 11.2* 11.1*  NEUTROABS 4.3  --   --   HGB 10.9* 10.3* 9.7*  HCT 33.1* 31.7* 30.4*  MCV 87.8 88.8 88.1  PLT 154 153 179    Recent Labs  02/23/14 1915 02/23/14 2353 02/24/14 0259  TROPONINI 0.31* 0.68* 0.46*    Radiology/Studies:  Dg Chest 2 View 02/23/2014   CLINICAL DATA:  CHEST PAIN  EXAM: CHEST - 2 VIEW  COMPARISON:  01/22/2014  FINDINGS: Relatively low lung volumes. Lungs are clear. Heart size and mediastinal contours are within normal limits. No effusion. Postop change at the distal right clavicle. Small spurs in the mid thoracic spine.  IMPRESSION: No acute cardiopulmonary disease.   Electronically Signed   By: Arne Cleveland M.D.   On: 02/23/2014 20:46   Ct Angio Chest W/cm &/or Wo Cm 02/24/2014   CLINICAL DATA:  CP. fever. Recent MI. CP. fever. Recent MI.  EXAM: CT ANGIOGRAPHY CHEST WITH CONTRAST  TECHNIQUE: Multidetector CT imaging of the chest was performed using the standard protocol  during bolus administration of intravenous contrast. Multiplanar CT image reconstructions and MIPs were obtained to evaluate the vascular anatomy. Patient was pre-medicated due to history of contrast allergy, without complication.  CONTRAST:  169mL OMNIPAQUE IOHEXOL 350 MG/ML SOLN  COMPARISON:  01/22/2014  FINDINGS: Satisfactory opacification of pulmonary arteries noted, and there is no evidence of pulmonary emboli. No significant aortic enhancement ; No evidence of aneurysm . Patchy aortic arch and coronary calcifications. No pleural or pericardial effusion. No hilar or mediastinal adenopathy. Moderate hiatal hernia involving gastric  fundus. Minimal subsegmental/dependent atelectasis posteriorly in the lower lobes. Lungs are otherwise clear. Spurring at multiple contiguous levels in the mid and lower thoracic spine. Scattered small calcified granulomas in the spleen. Remainder visualized upper abdomen unremarkable.  Review of the MIP images confirms the above findings.  IMPRESSION: 1. Negative for acute PE. 2. Atherosclerosis, including coronary artery disease. Please note that although the presence of coronary artery calcium documents the presence of coronary artery disease, the severity of this disease and any potential stenosis cannot be assessed on this non-gated CT examination. Assessment for potential risk factor modification, dietary therapy or pharmacologic therapy may be warranted, if clinically indicated. 3. Hiatal hernia.   Electronically Signed   By: Arne Cleveland M.D.   On: 02/24/2014 01:10   Nm Myocar Multi W/spect W/wall Motion / Ef 02/17/2014   CLINICAL DATA:  Chest pain. Diaphoresis. History of coronary artery disease.  EXAM: MYOCARDIAL IMAGING WITH SPECT (REST AND PHARMACOLOGIC-STRESS)  GATED LEFT VENTRICULAR WALL MOTION STUDY  LEFT VENTRICULAR EJECTION FRACTION  TECHNIQUE: Standard myocardial SPECT imaging was performed after resting intravenous injection of 10 mCi Tc-62m sestamibi. Subsequently, intravenous infusion of Lexiscan was performed under the supervision of the Cardiology staff. At peak effect of the drug, 30 mCi Tc-28m sestamibi was injected intravenously and standard myocardial SPECT imaging was performed. Quantitative gated imaging was also performed to evaluate left ventricular wall motion, and estimate left ventricular ejection fraction.  COMPARISON:  Myocardial perfusion study 12/25/2012. CT 01/22/2014. Echocardiogram 01/23/2014 and 12/17/2012.  FINDINGS: Perfusion: Fixed area decreased perfusion is present in the LEFT lateral wall. This severe area of decreased perfusion is moderate-sized in the mid  lateral wall. Mild hypokinesia of the lateral wall. Compared to prior study of 12/24/2012, this defect is new and compatible with interval infarct. There is a small area of decreased perfusion on stress images in the inferior near the apex anterior wall on stress images which appears normal on rest images.  Wall Motion: Mild LEFT lateral wall hypokinesia.  Left Ventricular Ejection Fraction: 63 %  End diastolic volume 41 mL ml  End systolic volume 15 mL ml  IMPRESSION: 1. New LEFT lateral wall infarction. Small area of mildly decreased perfusion in the inferior anterior wall near the apex.  2. Mild hypokinesia associated with LEFT lateral wall infarction.  3. Left ventricular ejection fraction 63%. This is discrepant when comparing to echocardiogram may 01/23/2014 which reported ejection fraction of 25-30%. This is similar to the ejection fraction reported on prior echocardiogram 12/17/2012.  4. Intermediate-risk stress test findings*.  *2012 Appropriate Use Criteria for Coronary Revascularization Focused Update: J Am Coll Cardiol. [id number]. http://content.airportbarriers.com.aspx?articleid=1201161   Electronically Signed   By: Dereck Ligas M.D.   On: 02/17/2014 12:12   EKG NSR 1st degree AVB 62bpm occ PVCs/ventricular escape complexes QTc 509   Assessment and Plan  1. CAD with recurrent NSTEMI -  - s/p cath 02/24/14: patent stents, severe stenosis in small caliber PDA s/p PTCA/balloon angioplasty  only (no stent due to small vessel size), bridging/kinking of vessel in the tortuous OM2 just before the stented segment.  - recent history: NSTEMI with cath 01/22/14 with severe stenosis in OM2 - balloon angioplasty without stents at that time; recurrent chest pain with abnormal nuc s/p DESx2 to mOM2 02/17/14  - h/o statin intolerance 2. Transient fever without obvious source of infection, resolved, question due to NSTEMI  - mild leukocytosis felt likely due to Solu-Medrol 3. Chronic systolic CHF -  EF improved to 50-55% by cath 02/24/14 (was 25-30% by 01/23/14 echo) 4. QTc prolongation - will review EKG with MD 5. Anemia - progressive since 02/18/14 (recently in the 12 range), possibly related to radial ecchymosis 6. Transient LBBB this admission 7. DM2  Will discuss plan for anemia, QTC with MD. We might consider also backing down on Coreg dose given soft BP and sinus bradycardia. Do not feel she needs CHF clinic appointment; has f/u 9/10 with PA.  Signed, Melina Copa PA-C

## 2014-02-25 NOTE — Progress Notes (Signed)
CARDIAC REHAB PHASE I   PRE:  Rate/Rhythm: 75 SR  BP:  Supine:   Sitting: 121/44  Standing:    SaO2:   MODE:  Ambulation500 ft   POST:  Rate/Rhythm: 90 SR  BP:  Supine:   Sitting: 134/42  Standing:    SaO2:  [id number] Pt just seen by me last week so only reviewed NTG use, brilinta and encouraged starting walking guidelines at beginning of 5 mins twice a day starting tomorrow. Encouraged pt not to do any heavy lifting or straining but take it easy. Understanding voiced. Walked 500 ft and tolerated well. No CP or SOB.   Graylon Good, RN BSN  02/25/2014 8:37 AM

## 2014-02-25 NOTE — Discharge Summary (Signed)
Discharge Summary   Patient ID: Carol [name] MRN: [id number], DOB/AGE: 76-May-1937 76 y.o. Admit date: 02/23/2014 D/C date:     02/25/2014  Primary Care Provider: Lillard Anes, MD Primary Cardiologist: Angelena Form  Primary Discharge Diagnoses:  1. CAD with recurrent NSTEMI -  - s/p cath 02/24/14: patent stents, severe stenosis in small caliber PDA s/p PTCA/balloon angioplasty only (no stent due to small vessel size), bridging/kinking of vessel in the tortuous OM2 just before the stented segment.  - recent history: NSTEMI with cath 01/22/14 with severe stenosis in OM2 - balloon angioplasty without stents at that time; recurrent chest pain with abnormal nuc s/p DESx2 to mOM2 02/17/14  - h/o intolerance to Lipitor and Zocor - will try trial of Crestor 2. Transient fever without obvious source of infection, resolved, question due to NSTEMI  - mild leukocytosis felt likely due to Solu-Medrol  3. Chronic systolic CHF - EF improved to 50-55% by cath 02/24/14 (was 25-30% by 01/23/14 echo)  4. QTc prolongation, mild 5. Anemia - progressive since 02/18/14 (recently in the 12 range), possibly related to radial ecchymosis  6. Transient LBBB this admission  7. DM2 8. H/o hyperlipidemia 9. Hypertension   10. CKD stage III 11. Sinus bradycardia  Secondary Discharge Diagnoses:  1. Asthma  2. Gout, unspecified   3. Environmental allergies  4. Hyperthyroidism  5. GERD (gastroesophageal reflux disease)  6. TIA vs stroke 1990's "mild" - sounds like a TIA as she was told she had stroke symptoms but negative scans  Hospital Course:  Carol [name] is a 78 y/o F with HTN, HLD, CAD with recent history as above who presented to [facility] with recurrent chest pain and rigors after a short walk. She also had worsened nausea and vomiting so called EMS. In the ED, initial EKG showed a new LBBB but this resolved quickly. She initially had continual, positional 5/10 chest pain. First cTn was 0.31. She  underwent chest CT to exclude PE (with premedication due to contrast allergy) and PNA given a T 38.4, though her CT was negative. Her second troponin was 0.68 and cardiology was consulted for evaluation and management. She was admitted and placed on heparin per pharmacy with plan for cardiac cath. She was euvolemic on exam. Peak troponin was the 0.68 level. No infectious source was identified and temperature elevation resolved. She had mild bump in Cr to 1.15 on admission. She was treated with standard pre-cath IV fluids. She also received another round of premedication in preparation for cath the afternoon of 9/2. This subsequently found: Impression:  1. Double vessel CAD with patent stents OM2, severe stenosis small caliber PDA  2. Preserved LV systolic function  3. Successful PTCA/balloon angioplasty PDA (no stent placed due to small vessel size)  4. Bridging/kinking of vessel in the tortuous OM2 just before the stented segment.  Recommendation was for continued management with ASA, brilinta, BB, and statin. Dr. Ellyn Hack recommended to try her on Crestor at 10mg  daily. She has not tolerated atorvastatin in the past. Imdur was restarted. Coreg was decreased to 3.125mg  BID due to soft BP and mild sinus bradycardia. She had mild QT prolongation on EKG this AM possibly related to her bradycardia. Potassium was normal. She was noted to be mildly anemic which is felt due to the radial ecchymosis from her prior cath and procedures she has had recently. She denied any obvious bleeding and was advised to monitor for such. She says she feels "awesome" today and denies further  CP, SOB, nausea. Dr. Ellyn Hack has seen and examined the patient today and feels she is stable for discharge.  She already has f/u on 9/10 with Kerin Ransom, PA-C. At her followup appointment, would recommend to repeat a BMET to reassess Cr along with checking a CBC to assess stability of anemia. Would also recommend a repeat EKG to reassess QTc  and f/u lipids/LFTs in 6 weeks given statin initiation. Originally she also had a f/u with the CHF clinic on 9/10 but since LV function has normalized and she has not had any clinical manifestations of heart failure this admission, we have cancelled this appointment for now to preserve resources.  Discharge Vitals: Blood pressure 112/44, pulse 71, temperature 98 F (36.7 C), temperature source Oral, resp. rate 18, height 5' 7.5" (1.715 m), weight 182 lb 12.2 oz (82.9 kg), SpO2 97.00%.  Labs: Lab Results  Component Value Date   WBC 11.1* 02/25/2014   HGB 9.7* 02/25/2014   HCT 30.4* 02/25/2014   MCV 88.1 02/25/2014   PLT 179 02/25/2014    Recent Labs Lab 02/25/14 0414  NA 137  K 4.8  CL 105  CO2 19  BUN 37*  CREATININE 1.10  CALCIUM 8.8  GLUCOSE 302*    Recent Labs  02/23/14 1915 02/23/14 2353 02/24/14 0259  TROPONINI 0.31* 0.68* 0.46*   Lab Results  Component Value Date   CHOL 84 02/24/2014   HDL 29* 02/24/2014   LDLCALC 46 02/24/2014   TRIG 45 02/24/2014    Diagnostic Studies/Procedures   Dg Chest 2 View 02/23/2014   CLINICAL DATA:  CHEST PAIN  EXAM: CHEST - 2 VIEW  COMPARISON:  01/22/2014  FINDINGS: Relatively low lung volumes. Lungs are clear. Heart size and mediastinal contours are within normal limits. No effusion. Postop change at the distal right clavicle. Small spurs in the mid thoracic spine.  IMPRESSION: No acute cardiopulmonary disease.   Electronically Signed   By: Arne Cleveland M.D.   On: 02/23/2014 20:46   Ct Angio Chest W/cm &/or Wo Cm 02/24/2014   CLINICAL DATA:  CP. fever. Recent MI. CP. fever. Recent MI.  EXAM: CT ANGIOGRAPHY CHEST WITH CONTRAST  TECHNIQUE: Multidetector CT imaging of the chest was performed using the standard protocol during bolus administration of intravenous contrast. Multiplanar CT image reconstructions and MIPs were obtained to evaluate the vascular anatomy. Patient was pre-medicated due to history of contrast allergy, without complication.   CONTRAST:  18mL OMNIPAQUE IOHEXOL 350 MG/ML SOLN  COMPARISON:  01/22/2014  FINDINGS: Satisfactory opacification of pulmonary arteries noted, and there is no evidence of pulmonary emboli. No significant aortic enhancement ; No evidence of aneurysm . Patchy aortic arch and coronary calcifications. No pleural or pericardial effusion. No hilar or mediastinal adenopathy. Moderate hiatal hernia involving gastric fundus. Minimal subsegmental/dependent atelectasis posteriorly in the lower lobes. Lungs are otherwise clear. Spurring at multiple contiguous levels in the mid and lower thoracic spine. Scattered small calcified granulomas in the spleen. Remainder visualized upper abdomen unremarkable.  Review of the MIP images confirms the above findings.  IMPRESSION: 1. Negative for acute PE. 2. Atherosclerosis, including coronary artery disease. Please note that although the presence of coronary artery calcium documents the presence of coronary artery disease, the severity of this disease and any potential stenosis cannot be assessed on this non-gated CT examination. Assessment for potential risk factor modification, dietary therapy or pharmacologic therapy may be warranted, if clinically indicated. 3. Hiatal hernia.   Electronically Signed   By: Arne Cleveland  M.D.   On: 02/24/2014 01:10   02/24/14 Cardiac Catheterization Operative Report  LEEOLA BURACKER  [id number]  9/2/20152:49 PM  Lillard Anes, MD  Procedure Performed:  1. Left Heart Catheterization 2. Selective Coronary Angiography 3. Left ventricular angiogram 4. PTCA/balloon angioplasty only PDA Operator: Lauree Chandler, MD  Arterial access site: Right radial artery.  Indication: 78 yo female with history of DM, HTN, HLD and CAD admitted with unstable angina/NSTEMI. She has had two recent admissions with chest pain. Admitted with NSTEMI with cath 01/22/14 showing severe stenosis in the OM2. Balloon angioplasty only with no stent placement at  that time. She had ongoing chest pain after that and was readmitted end of August 2015. Cardiac cath 02/17/14 and the OM2 lesion had restenosed. I placed two overlapping drug eluting stents in the OM2. She was noted at the time of that cath to have severe stenosis in the small caliber PDA but I elected to treat it medically. Now readmitted with slight elevation of troponin and episode of chest pain.  Procedure Details:  The risks, benefits, complications, treatment options, and expected outcomes were discussed with the patient. The patient and/or family concurred with the proposed plan, giving informed consent. The patient was brought to the cath lab after IV hydration was begun and oral premedication was given. The patient was further sedated with Versed and Fentanyl. The right wrist was assessed with a reverse Allens test which was positive. The right wrist was prepped and draped in a sterile fashion. 1% lidocaine was used for local anesthesia. Using the modified Seldinger access technique, a 5 French sheath was placed in the right radial artery. 3 mg Verapamil was given through the sheath. 4500 units IV heparin was given. Standard diagnostic catheters were used to perform selective coronary angiography. A pigtail catheter was used to perform a left ventricular angiogram. Her stents in the second OM were noted to be patent without restenosis. The lesion was still present in the small caliber PDA. Given her elevated troponin and unstable angina, I elected to proceed to PCI of the small caliber PDA with intention of balloon angioplasty only.  PCI Note: She was given an additional 5500 units IV heparin. She has been on dual anti-platelet therapy so no new agent was loaded. (She has been on Brilinta). I engaged the RCA with a JR4 guiding catheter. ACT was 275 so an additional 2000 units IV heparin was given. A Cougar IC wire was advanced down the RCA into the PDA branch. I then inflated a 1.5 x 6 mm balloon in the  area of tightest stenosis in the PDA. The balloon was inflated for one minute. Angiography demonstrated an excellent result with reduction in stenosis from 99% down to 10%. The sheath was removed from the right radial artery and a Terumo hemostasis band was applied at the arteriotomy site on the right wrist. There were no immediate complications. The patient was taken to the recovery area in stable condition.  Hemodynamic Findings:  Central aortic pressure: 112/46  Left ventricular pressure: 117/10/21  Angiographic Findings:  Left main: 20% mid stenosis.  Left Anterior Descending Artery: Large caliber vessel that courses to the apex. The proximal vessel has a 40-50% stenosis. The mid vessel has a focal 30% stenosis. The diagonal branch is small caliber with diffuse mild plaque.  Circumflex Artery: Moderate caliber vessel with a very small caliber intermediate branch, a very small caliber first OM branch and a moderate calliber second obtuse marginal branch. The proximal Circumflex has  a focal 30% stenosis. The intermediate branch is very small in caliber with proximal 50% stenosis. The second obtuse marginal branch has patent stents with no restenosis. There appears to be bridging in the proximal OM2 just before the stent, likely as a result of straightening of the vessel with the stents. The mid AV groove Circumflex has a 40% stenosis beyond the takeoff of the second OM branch.  Right Coronary Artery: Large caliber dominant vessel with small caliber PDA. The proximal vessel has a 30% stenosis. The mid vessel has diffuse 40% stenosis. The distal vessel tapers to a small caliber vessel. The PDA is a 1.5 mm vessel. There is a 99% stenosis in the mid PDA.  Left Ventricular Angiogram: LVEF=50-55%.  Impression:  1. Double vessel CAD with patent stents OM2, severe stenosis small caliber PDA  2. Preserved LV systolic function  3. Successful PTCA/balloon angioplasty PDA (no stent placed due to small vessel  size)  4. Bridging/kinking of vessel in the tortuous OM2 just before the stented segment.  Recommendations: Continue medical management with beta blocker, statin, ASA, Brilinta. Will restart Imdur.  Complications: None. The patient tolerated the procedure well.    Discharge Medications   Current Discharge Medication List    START taking these medications   Details  rosuvastatin (CRESTOR) 10 MG tablet Take 1 tablet (10 mg total) by mouth every evening. Qty: 30 tablet, Refills: 3      CONTINUE these medications which have CHANGED   Details  carvedilol (COREG) 3.125 MG tablet Take 1 tablet (3.125 mg total) by mouth 2 (two) times daily with a meal. Qty: 60 tablet, Refills: 3      CONTINUE these medications which have NOT CHANGED   Details  albuterol (PROVENTIL HFA;VENTOLIN HFA) 108 (90 BASE) MCG/ACT inhaler Inhale 1 puff into the lungs every 6 (six) hours as needed for wheezing or shortness of breath.    allopurinol (ZYLOPRIM) 300 MG tablet Take 300 mg by mouth daily.    aspirin EC 81 MG EC tablet Take 1 tablet (81 mg total) by mouth daily.    Biotin 5000 MCG CAPS Take 1 capsule by mouth daily.    colchicine 0.6 MG tablet Take 1 tablet (0.6 mg total) by mouth 2 (two) times daily.     insulin aspart (NOVOLOG) 100 UNIT/ML injection Before each meal 3 times a day , 140-199 - 4 units, 200-250 - 6 units, 251-299 - 8 units,  300-349 - 10 units,  350 or above 12 units.Insulin PEN if approved, provide syringes and needles if needed.     insulin detemir (LEVEMIR) 100 UNIT/ML injection Inject 0.25 mLs (25 Units total) into the skin 2 (two) times daily. Syringes and needles as needed    isosorbide mononitrate (IMDUR) 30 MG 24 hr tablet Take 1 tablet (30 mg total) by mouth daily.    lisinopril (PRINIVIL,ZESTRIL) 2.5 MG tablet Take 1 tablet (2.5 mg total) by mouth daily.     nitroGLYCERIN (NITROSTAT) 0.4 MG SL tablet Place 0.4 mg under the tongue every 5 (five) minutes as needed for chest  pain.    omeprazole (PRILOSEC) 20 MG capsule Take 20 mg by mouth daily.    thyroid (ARMOUR) 60 MG tablet Take 120 mg by mouth daily.     ticagrelor (BRILINTA) 90 MG TABS tablet Take 1 tablet (90 mg total) by mouth 2 (two) times daily.     Insulin Syringe-Needle U-100 25G X 1" 1 ML MISC Dispense for taking Levemir and Novolog 1 month  supply, if PEN not approved.       STOP taking these medications     atorvastatin (LIPITOR) 80 MG tablet         Disposition   The patient will be discharged in stable condition to home. Discharge Instructions   Diet - low sodium heart healthy    Complete by:  As directed   Diabetic diet     Increase activity slowly    Complete by:  As directed   No driving for 1 week. No lifting over 10 lbs for 2 weeks. No sexual activity for 2 weeks. Keep procedure site clean & dry. If you notice increased pain, swelling, bleeding or pus, call/return!  You may shower, but no soaking baths/hot tubs/pools for 1 week.   We have decreased your Coreg dose (new prescription). We have discontinued Lipitor and started Crestor.  Your bloodwork showed mild anemia which may be related to your recent procedures. Please monitor for any blood in your stool, black tarry stools, blood in your urine or any unusual bleeding. If you have any of this, notify your doctor immediately.          Follow-up Information   Follow up with Kerin Ransom, PA-C. (03/04/14 at 3pm. We will also plan for labwork that day. You do not need to attend the heart failure appointment earlier that day.)    Contact information:   [address] [place] [address] [phone] (Dr. Camillia Herter office)        Duration of Discharge Encounter: Greater than 30 minutes including physician and PA time.  Signed, Dayna Dunn PA-C 02/25/2014, 8:51 AM   Pt. Seen & examined.  OK for discharge.  Agree with Summary & d/c plan.   Leonie Man, M.D., M.S. Interventional Cardiologist     Pager # [phone] 02/25/2014

## 2014-03-02 ENCOUNTER — Telehealth: Payer: Self-pay | Admitting: Cardiovascular Disease

## 2014-03-02 LAB — CULTURE, BLOOD (ROUTINE X 2)
Culture: NO GROWTH
Culture: NO GROWTH

## 2014-03-02 NOTE — Telephone Encounter (Signed)
Carol [name], We can stop her Brilinta and start Plavix 75 mg daily. In regards to Crestor, she has not tolerated other statins. We can try to help with samples to lower her cost, at least for this year. I wonder if she would qualify for any of the savings programs for Crestor? chris

## 2014-03-02 NOTE — Telephone Encounter (Signed)
New message    [facility] calling     Patient could not afford the briilinta  $129.00 . Already use free coupon. crestor cost is $ 100.00

## 2014-03-03 MED ORDER — CLOPIDOGREL BISULFATE 75 MG PO TABS: 75.0000 mg | ORAL_TABLET | Freq: Every day | ORAL | Status: DC

## 2014-03-03 MED ORDER — PANTOPRAZOLE SODIUM 40 MG PO TBEC: 40.0000 mg | DELAYED_RELEASE_TABLET | Freq: Every day | ORAL | Status: DC

## 2014-03-03 NOTE — Telephone Encounter (Signed)
Spoke with Sharyn Lull from [facility] and gave her information from Dr. Angelena Form. Will try and get Crestor covered at tier exception as pt has been unable to tolerate Zocor and Atorvastatin.  Per Selinda Eon number for insurance is [phone].  I then spoke with pt and gave her information from Dr. Angelena Form. She has one more dose of Brilinta. She will take this tonight and start Clopidogrel 75 mg  by mouth once daily tomorrow. Will send prescription to [place]. She has appt with Dr. Angelena Form tomorrow and we can give her samples of Crestor at that appt.  She is on Omeprazole. Pt aware this will need to be changed to Protonix 40 mg by mouth daily. Will send this prescription to [place] also

## 2014-03-03 NOTE — Telephone Encounter (Signed)
Left message to call back  

## 2014-03-04 ENCOUNTER — Encounter (HOSPITAL_COMMUNITY): Payer: Medicare Other

## 2014-03-04 ENCOUNTER — Encounter: Payer: Medicare Other | Admitting: Cardiology

## 2014-03-04 ENCOUNTER — Encounter: Payer: Self-pay | Admitting: Cardiovascular Disease

## 2014-03-04 ENCOUNTER — Ambulatory Visit (INDEPENDENT_AMBULATORY_CARE_PROVIDER_SITE_OTHER): Payer: Medicare Other | Admitting: Cardiovascular Disease

## 2014-03-04 VITALS — BP 100/58 | HR 67 | Ht 67.0 in | Wt 187.0 lb

## 2014-03-04 DIAGNOSIS — I2510 Atherosclerotic heart disease of native coronary artery without angina pectoris: Secondary | ICD-10-CM

## 2014-03-04 DIAGNOSIS — E1159 Type 2 diabetes mellitus with other circulatory complications: Secondary | ICD-10-CM

## 2014-03-04 DIAGNOSIS — E785 Hyperlipidemia, unspecified: Secondary | ICD-10-CM

## 2014-03-04 DIAGNOSIS — I10 Essential (primary) hypertension: Secondary | ICD-10-CM

## 2014-03-04 DIAGNOSIS — I5022 Chronic systolic (congestive) heart failure: Secondary | ICD-10-CM

## 2014-03-04 LAB — BASIC METABOLIC PANEL
BUN: 29 mg/dL — ABNORMAL HIGH (ref 6–23)
CALCIUM: 9.2 mg/dL (ref 8.4–10.5)
CO2: 24 mEq/L (ref 19–32)
CREATININE: 1.1 mg/dL (ref 0.4–1.2)
Chloride: 102 mEq/L (ref 96–112)
GFR: 52.99 mL/min — ABNORMAL LOW (ref >60.00)
GLUCOSE: 152 mg/dL — AB (ref 70–99)
Potassium: 4.4 mEq/L (ref 3.5–5.1)
Sodium: 134 mEq/L — ABNORMAL LOW (ref 135–145)

## 2014-03-04 LAB — CBC
HEMATOCRIT: 31.2 % — AB (ref 36.0–46.0)
HEMOGLOBIN: 10 g/dL — AB (ref 12.0–15.0)
MCHC: 32 g/dL (ref 30.0–36.0)
MCV: 88.7 fl (ref 78.0–100.0)
Platelets: 263 10*3/uL (ref 150.0–400.0)
RBC: 3.52 Mil/uL — AB (ref 3.87–5.11)
RDW: 18.5 % — ABNORMAL HIGH (ref 11.5–15.5)
WBC: 7.8 10*3/uL (ref 4.0–10.5)

## 2014-03-04 NOTE — Telephone Encounter (Signed)
I have spoken with [employer] and tier exception has been denied. Appeal letter can be written. I spoke with appeals department at [facility] and letter should be faxed to [phone]

## 2014-03-04 NOTE — Progress Notes (Signed)
History of Present Illness: 78 yo [name] with history of CAD, DM, HTN, asthma, hyperthyroidism, HLD who is here today for hospital follow up. I saw her June 2014 as a new patient for evaluation of chest pain. She had a normal stress test and echo in June 2014. She was admitted to [facility] 01/22/14 with NSTEMI. She underwent cardiac cath per Dr. Tamala Julian and he performed balloon angioplasty only of a moderate caliber, tortuous OM2 branch. Severe disease in small PDA was managed medically. LVEF was 25-30% by echo. She was in heart failure and was diuresed. She was readmitted 02/01/14 with weakness but no chest pain. Readmitted 02/15/14 with chest pain.  Troponin was negative but stress myoview with lateral wall defect c/w ischemia. Cardiac cath 02/17/14 and I placed 2 overlapping DES in the second OM branch. I elected to continue to treat the PDA branch medically given small caliber of vessel. She was readmitted 02/24/14 with recurrent chest pain and mildly elevated troponin. Cardiac cath 02/24/14 with patent stents OM2, severe disease PDA. I performed balloon angioplasty on the PDA branch. The vessel was too small for a stent. Also noted that the OM2 branch appears to have a bridging segment with systole just before the stent. LVEF=50-55% by LV gram.   She is here today for follow up.     Primary Care Physician: Reinaldo Meeker  Past Medical History  Diagnosis Date  . Hypertension   . Asthma   . Hyperlipidemia     a. H/o intolerance to Zocor, Lipitor. Had muscle aches on higher dose Crestor.  . Gout, unspecified   . CAD S/P percutaneous coronary angioplasty 01/22/2014    A. (12/2013): POBA - OM2 99% (very tortuous segment) - reduced ~ 50% & TIMI 3 flow, 80-90% stenosis in mid PDA, Irregularities <50% in mRCA, pLAD and prox LCx; b. 02/17/14: Moderate sized fixed defect in Lateral wall --> cath with OM2 restenosis & otherwise stable --> Xience Alpine DES x 2 (2.25 mm x 12 & 8 mm). c. NSTEMI 02/2014 s/p PTCA/balloon  angioplasty only to small caliber PDA.  Marland Kitchen Chronic systolic heart failure     a. ICM b. ECHO (01/2014): EF 25-30%, grade I DD, trivial MR. c. EF by Myoview 02/17/14 = ~53%. d. EF by cath 02/24/14 - improved to 50-55%.  . NSTEMI (non-ST elevated myocardial infarction) 2014; 12/2013, 01/2014  . Environmental allergies   . Hyperthyroidism   . Type II diabetes mellitus   . GERD (gastroesophageal reflux disease)   . TIA (transient ischemic attack) 1990's    a. TIA vs stroke 1990's "mild" - sounds like a TIA as she was told she had stroke symptoms but negative scans. Denies residual effects.  . Contrast media allergy   . QT prolongation     a. Noted on EKG 02/2014.  Marland Kitchen Anemia     a. Noted on labs 02/2014 possibly procedurally related.  Marland Kitchen LBBB (left bundle branch block)     a. Transient during 02/2014 admission.  . CKD (chronic kidney disease), stage III   . Sinus bradycardia     a. HR 40s-50s during 02/2014 admission, limiting BB dose.    Past Surgical History  Procedure Laterality Date  . Shoulder open rotator cuff repair Bilateral 1980's - 1990's  . Total knee arthroplasty Right 2009  . Cesarean section  [id number]; 1960  . Abdominal hysterectomy    . Appendectomy  1959  . Cholecystectomy  1989  . Joint replacement    . Reduction  mammaplasty Bilateral 1990's  . Cataract extraction w/ intraocular lens  implant, bilateral Bilateral 2013  . Coronary angioplasty  12/2013    POBA - OM2  . Percutaneous coronary stent intervention (pci-s)  02/17/14    For restenosis of OM2 - PCI with Xience Apline DES 2.25 mm x 12 mm & 2.25 mm x 8 mm overlapping    Current Outpatient Prescriptions  Medication Sig Dispense Refill  . albuterol (PROVENTIL HFA;VENTOLIN HFA) 108 (90 BASE) MCG/ACT inhaler Inhale 1 puff into the lungs every 6 (six) hours as needed for wheezing or shortness of breath.      . allopurinol (ZYLOPRIM) 300 MG tablet Take 300 mg by mouth daily.      Marland Kitchen aspirin EC 81 MG EC tablet Take 1 tablet (81 mg  total) by mouth daily.  30 tablet  3  . atorvastatin (LIPITOR) 80 MG tablet       . Biotin 5000 MCG CAPS Take 1 capsule by mouth daily.      . carvedilol (COREG) 3.125 MG tablet Take 1 tablet (3.125 mg total) by mouth 2 (two) times daily with a meal.  60 tablet  3  . clopidogrel (PLAVIX) 75 MG tablet Take 1 tablet (75 mg total) by mouth daily.  30 tablet  11  . colchicine 0.6 MG tablet Take 1 tablet (0.6 mg total) by mouth 2 (two) times daily.  60 tablet  0  . insulin aspart (NOVOLOG) 100 UNIT/ML injection Before each meal 3 times a day , 140-199 - 4 units, 200-250 - 6 units, 251-299 - 8 units,  300-349 - 10 units,  350 or above 12 units.Insulin PEN if approved, provide syringes and needles if needed.  10 mL  11  . insulin detemir (LEVEMIR) 100 UNIT/ML injection Inject 0.25 mLs (25 Units total) into the skin 2 (two) times daily. Syringes and needles as needed  10 mL  11  . Insulin Syringe-Needle U-100 25G X 1" 1 ML MISC Dispense for taking Levemir and Novolog 1 month supply, if PEN not approved.  30 each  0  . isosorbide mononitrate (IMDUR) 30 MG 24 hr tablet Take 1 tablet (30 mg total) by mouth daily.  30 tablet  3  . lisinopril (PRINIVIL,ZESTRIL) 2.5 MG tablet Take 1 tablet (2.5 mg total) by mouth daily.  30 tablet  0  . nitroGLYCERIN (NITROSTAT) 0.4 MG SL tablet Place 0.4 mg under the tongue every 5 (five) minutes as needed for chest pain.      Marland Kitchen NOVOTWIST 32G X 5 MM MISC       . ONE TOUCH ULTRA TEST test strip       . pantoprazole (PROTONIX) 40 MG tablet Take 1 tablet (40 mg total) by mouth daily.  30 tablet  11  . rosuvastatin (CRESTOR) 10 MG tablet Take 1 tablet (10 mg total) by mouth every evening.  30 tablet  3  . spironolactone (ALDACTONE) 25 MG tablet 12.5 mg.       . thyroid (ARMOUR) 60 MG tablet Take 120 mg by mouth daily.        No current facility-administered medications for this visit.    Allergies  Allergen Reactions  . Contrast Media [Iodinated Diagnostic Agents] Other (See  Comments)    unknown  . Cortizone-10 [Hydrocortisone] Other (See Comments)    unknown  . Lipitor [Atorvastatin]     Muscle aches- severe  . Zocor [Simvastatin]     Muscle aches- severe    History  Social History  . Marital Status: Widowed    Spouse Name: N/A    Number of Children: 2  . Years of Education: N/A   Occupational History  . Retired    Social History Main Topics  . Smoking status: Never Smoker   . Smokeless tobacco: Never Used  . Alcohol Use: No  . Drug Use: No  . Sexual Activity: No   Other Topics Concern  . Not on file   Social History Narrative   Lives in [place] by herself.     Family History  Problem Relation Age of Onset  . Hypertension Mother   . Diabetes Mother   . Heart attack Mother 64  . Heart disease Father   . Diabetes Brother   . Heart disease Brother     Review of Systems:  As stated in the HPI and otherwise negative.   BP 100/58  Pulse 67  Ht 5\' 7"  (1.702 m)  Wt 187 lb (84.823 kg)  BMI 29.28 kg/m2  Physical Examination: General: Well developed, well nourished, NAD HEENT: OP clear, mucus membranes moist SKIN: warm, dry. No rashes. Neuro: No focal deficits Musculoskeletal: Muscle strength 5/5 all ext Psychiatric: Mood and affect normal Neck: No JVD, no carotid bruits, no thyromegaly, no lymphadenopathy. Lungs:Clear bilaterally, no wheezes, rhonci, crackles Cardiovascular: Regular rate and rhythm. No murmurs, gallops or rubs. Abdomen:Soft. Bowel sounds present. Non-tender.  Extremities: No lower extremity edema. Pulses are 2 + in the bilateral DP/PT.  Cardiac cath 02/24/14:  Left main: 20% mid stenosis.  Left Anterior Descending Artery: Large caliber vessel that courses to the apex. The proximal vessel has a 40-50% stenosis. The mid vessel has a focal 30% stenosis. The diagonal branch is small caliber with diffuse mild plaque.  Circumflex Artery: Moderate caliber vessel with a very small caliber intermediate branch, a very  small caliber first OM branch and a moderate calliber second obtuse marginal branch. The proximal Circumflex has a focal 30% stenosis. The intermediate branch is very small in caliber with proximal 50% stenosis. The second obtuse marginal branch has patent stents with no restenosis. There appears to be bridging in the proximal OM2 just before the stent, likely as a result of straightening of the vessel with the stents. The mid AV groove Circumflex has a 40% stenosis beyond the takeoff of the second OM branch.  Right Coronary Artery: Large caliber dominant vessel with small caliber PDA. The proximal vessel has a 30% stenosis. The mid vessel has diffuse 40% stenosis. The distal vessel tapers to a small caliber vessel. The PDA is a 1.5 mm vessel. There is a 99% stenosis in the mid PDA.  Left Ventricular Angiogram: LVEF=50-55%.  Impression:  1. Double vessel CAD with patent stents OM2, severe stenosis small caliber PDA  2. Preserved LV systolic function  3. Successful PTCA/balloon angioplasty PDA (no stent placed due to small vessel size)  4. Bridging/kinking of vessel in the tortuous OM2 just before the stented segment.   EKG: NSR, rate 67 bpm.   Assessment and Plan:   1. CAD:  She is stable after recent coronary interventions. We changed her Brilinta to Plavix this week. She will remain on ASA and Plavix for at least one year. Will continue beta blocker, statin, Ace-inh, Imdur.   2. HTN: BP well controlled. No changes  3. DM with vascular complications (CAD): Followed in primary care.   4. HLD: Continue statin.   5. Chronic systolic CHF: She is on a small dose of Lasix.  LV function is now normal. Will continue low dose Lasix.

## 2014-03-04 NOTE — Patient Instructions (Signed)
Your physician recommends that you schedule a follow-up appointment in:  3-4 months.   Your physician has recommended you make the following change in your medication:  Stop aldactone  Call and let us know the name and dose of cholesterol medication you are taking

## 2014-03-04 NOTE — Telephone Encounter (Signed)
Pt saw Dr. Angelena Form today in the office and was given samples of Crestor. She was given number for [place] and me and will contact them to see if she qualifies for assistance program.

## 2014-03-05 NOTE — Telephone Encounter (Signed)
Follow up     Want Pat to know that patient is on crestor

## 2014-03-22 ENCOUNTER — Other Ambulatory Visit (HOSPITAL_COMMUNITY): Payer: Self-pay | Admitting: Internal Medicine

## 2014-03-22 ENCOUNTER — Encounter: Payer: Self-pay | Admitting: Critical Care Medicine

## 2014-03-23 ENCOUNTER — Telehealth: Payer: Self-pay | Admitting: Critical Care Medicine

## 2014-03-23 ENCOUNTER — Ambulatory Visit (INDEPENDENT_AMBULATORY_CARE_PROVIDER_SITE_OTHER): Payer: Medicare Other | Admitting: Critical Care Medicine

## 2014-03-23 ENCOUNTER — Encounter: Payer: Self-pay | Admitting: Critical Care Medicine

## 2014-03-23 VITALS — BP 156/68 | HR 65 | Temp 97.8°F | Ht 67.5 in | Wt 190.0 lb

## 2014-03-23 DIAGNOSIS — J4540 Moderate persistent asthma, uncomplicated: Secondary | ICD-10-CM | POA: Insufficient documentation

## 2014-03-23 DIAGNOSIS — J45909 Unspecified asthma, uncomplicated: Secondary | ICD-10-CM

## 2014-03-23 DIAGNOSIS — J441 Chronic obstructive pulmonary disease with (acute) exacerbation: Secondary | ICD-10-CM

## 2014-03-23 MED ORDER — OMEPRAZOLE-SODIUM BICARBONATE 40-1100 MG PO CAPS
1.0000 | ORAL_CAPSULE | Freq: Every day | ORAL | Status: DC
Start: 2014-03-23 — End: 2014-04-23

## 2014-03-23 NOTE — Assessment & Plan Note (Signed)
Moderate persistent asthma without evidence of frank pneumoconiosis on CT scan of chest. 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 However no evidence for granulomatous lung disease seen on chest CT scan. The patient likely has reactive airways disease on the basis of atopic features. Also reflux disease playing a role. Plan Obtain hypersensitivity profile and IgE and also allergy panel Pre and Post Spirometry at [facility] Follow Reflux Diet Stop Protonix Start Zegerid once daily Stay on Proair as needed Follow up with Dr. Joya Gaskins in 2 months

## 2014-03-23 NOTE — Patient Instructions (Signed)
Labs today at [place] Spirometry at [facility] Follow Reflux Diet Stop Protonix Start Zegerid once daily Stay on Proair as needed Follow up with Dr. Joya Gaskins in 2 months

## 2014-03-23 NOTE — Telephone Encounter (Signed)
Pt in office today for OV - needs to schedule 2 month follow up with Dr. Joya Gaskins in [place] (was unable to schedule at time of check out d/t system being down.) [email or web address] on home and cell #s to schedule this.

## 2014-03-23 NOTE — Progress Notes (Signed)
Subjective:    Patient ID: Carol [name], female    DOB: [birth year], 78 y.o.   MRN: [id number]  Shortness of Breath This is a new problem. The current episode started more than 1 month ago. The problem occurs constantly. The problem has been gradually worsening. Associated symptoms include chest pain, rhinorrhea and wheezing. Pertinent negatives include no abdominal pain, claudication, coryza, ear pain, fever, headaches, hemoptysis, leg pain, leg swelling, neck pain, orthopnea, PND, rash, sore throat, sputum production, swollen glands, syncope or vomiting. The symptoms are aggravated by any activity and exercise. She has tried beta agonist inhalers for the symptoms. The treatment provided moderate relief. Her past medical history is significant for allergies, asthma, CAD and a heart failure. There is no history of aspirin allergies, COPD, DVT, PE, pneumonia or a recent surgery.   Past Medical History  Diagnosis Date  . Hypertension   . Asthma   . Hyperlipidemia     a. H/o intolerance to Zocor, Lipitor. Had muscle aches on higher dose Crestor.  . Gout, unspecified   . CAD S/P percutaneous coronary angioplasty 01/22/2014    A. (12/2013): POBA - OM2 99% (very tortuous segment) - reduced ~ 50% & TIMI 3 flow, 80-90% stenosis in mid PDA, Irregularities <50% in mRCA, pLAD and prox LCx; b. 02/17/14: Moderate sized fixed defect in Lateral wall --> cath with OM2 restenosis & otherwise stable --> Xience Alpine DES x 2 (2.25 mm x 12 & 8 mm). c. NSTEMI 02/2014 s/p PTCA/balloon angioplasty only to small caliber PDA.  Marland Kitchen Chronic systolic heart failure     a. ICM b. ECHO (01/2014): EF 25-30%, grade I DD, trivial MR. c. EF by Myoview 02/17/14 = ~53%. d. EF by cath 02/24/14 - improved to 50-55%.  . NSTEMI (non-ST elevated myocardial infarction) 2014; 12/2013, 01/2014  . Environmental allergies   . Hyperthyroidism   . Type II diabetes mellitus   . GERD (gastroesophageal reflux disease)   . TIA (transient ischemic  attack) 1990's    a. TIA vs stroke 1990's "mild" - sounds like a TIA as she was told she had stroke symptoms but negative scans. Denies residual effects.  . Contrast media allergy   . QT prolongation     a. Noted on EKG 02/2014.  Marland Kitchen Anemia     a. Noted on labs 02/2014 possibly procedurally related.  Marland Kitchen LBBB (left bundle branch block)     a. Transient during 02/2014 admission.  . CKD (chronic kidney disease), stage III   . Sinus bradycardia     a. HR 40s-50s during 02/2014 admission, limiting BB dose.     Family History  Problem Relation Age of Onset  . Hypertension Mother   . Diabetes Mother   . Heart attack Mother 37  . Heart disease Father   . Diabetes Brother   . Heart disease Brother   . Cancer Sister   . Emphysema Father      History   Social History  . Marital Status: Widowed    Spouse Name: N/A    Number of Children: 2  . Years of Education: N/A   Occupational History  . Retired    Social History Main Topics  . Smoking status: Never Smoker   . Smokeless tobacco: Never Used  . Alcohol Use: No  . Drug Use: No  . Sexual Activity: No   Other Topics Concern  . Not on file   Social History Narrative   Lives in [place] by herself.  Allergies  Allergen Reactions  . Contrast Media [Iodinated Diagnostic Agents] Other (See Comments)    unknown  . Cortizone-10 [Hydrocortisone] Other (See Comments)    unknown  . Lipitor [Atorvastatin]     Muscle aches- severe  . Zocor [Simvastatin]     Muscle aches- severe     Outpatient Prescriptions Prior to Visit  Medication Sig Dispense Refill  . albuterol (PROVENTIL HFA;VENTOLIN HFA) 108 (90 BASE) MCG/ACT inhaler Inhale 1 puff into the lungs every 6 (six) hours as needed for wheezing or shortness of breath.      . allopurinol (ZYLOPRIM) 300 MG tablet Take 300 mg by mouth daily.      Marland Kitchen aspirin EC 81 MG EC tablet Take 1 tablet (81 mg total) by mouth daily.  30 tablet  3  . Biotin 5000 MCG CAPS Take 1 capsule by mouth  daily.      . carvedilol (COREG) 3.125 MG tablet Take 1 tablet (3.125 mg total) by mouth 2 (two) times daily with a meal.  60 tablet  3  . clopidogrel (PLAVIX) 75 MG tablet Take 1 tablet (75 mg total) by mouth daily.  30 tablet  11  . insulin aspart (NOVOLOG) 100 UNIT/ML injection Before each meal 3 times a day , 140-199 - 4 units, 200-250 - 6 units, 251-299 - 8 units,  300-349 - 10 units,  350 or above 12 units.Insulin PEN if approved, provide syringes and needles if needed.  10 mL  11  . insulin detemir (LEVEMIR) 100 UNIT/ML injection Inject 0.25 mLs (25 Units total) into the skin 2 (two) times daily. Syringes and needles as needed  10 mL  11  . Insulin Syringe-Needle U-100 25G X 1" 1 ML MISC Dispense for taking Levemir and Novolog 1 month supply, if PEN not approved.  30 each  0  . isosorbide mononitrate (IMDUR) 30 MG 24 hr tablet Take 1 tablet (30 mg total) by mouth daily.  30 tablet  3  . lisinopril (PRINIVIL,ZESTRIL) 2.5 MG tablet Take 1 tablet (2.5 mg total) by mouth daily.  30 tablet  0  . nitroGLYCERIN (NITROSTAT) 0.4 MG SL tablet Place 0.4 mg under the tongue every 5 (five) minutes as needed for chest pain.      Marland Kitchen NOVOTWIST 32G X 5 MM MISC       . ONE TOUCH ULTRA TEST test strip       . rosuvastatin (CRESTOR) 10 MG tablet Take 1 tablet (10 mg total) by mouth every evening.  30 tablet  3  . thyroid (ARMOUR) 60 MG tablet Take 120 mg by mouth daily.       . colchicine 0.6 MG tablet Take 1 tablet (0.6 mg total) by mouth 2 (two) times daily.  60 tablet  0  . pantoprazole (PROTONIX) 40 MG tablet Take 1 tablet (40 mg total) by mouth daily.  30 tablet  11  . atorvastatin (LIPITOR) 80 MG tablet        No facility-administered medications prior to visit.       Review of Systems  Constitutional: Negative for fever, chills, diaphoresis, activity change, appetite change and fatigue.  HENT: Positive for postnasal drip and rhinorrhea. Negative for ear pain, nosebleeds, sore throat, trouble  swallowing and voice change.   Eyes: Negative.   Respiratory: Positive for chest tightness, shortness of breath and wheezing. Negative for apnea, cough, hemoptysis, sputum production, choking and stridor.   Cardiovascular: Positive for chest pain. Negative for palpitations, orthopnea, claudication, leg swelling,  syncope and PND.  Gastrointestinal: Negative for vomiting and abdominal pain.       Severe GERD symptoms despite 40mg  daily pantoprazole  Endocrine: Negative.   Genitourinary: Negative.   Musculoskeletal: Negative for neck pain.  Skin: Negative for rash.  Allergic/Immunologic: Positive for environmental allergies.  Neurological: Negative.  Negative for headaches.  Hematological: Negative.   Psychiatric/Behavioral: Negative.        Objective:   Physical Exam Filed Vitals:   03/23/14 1125  BP: 156/68  Pulse: 65  Temp: 97.8 F (36.6 C)  TempSrc: Oral  Height: 5' 7.5" (1.715 m)  Weight: 190 lb (86.183 kg)  SpO2: 99%    Gen: Pleasant, well-nourished, in no distress,  normal affect  ENT: No lesions,  mouth clear,  oropharynx clear, no postnasal drip  Neck: No JVD, no TMG, no carotid bruits  Lungs: No use of accessory muscles, no dullness to percussion, clear without rales or rhonchi  Cardiovascular: RRR, heart sounds normal, no murmur or gallops, no peripheral edema  Abdomen: soft and NT, no HSM,  BS normal  Musculoskeletal: No deformities, no cyanosis or clubbing  Neuro: alert, non focal  Skin: Warm, no lesions or rashes  No results found. CT Chest 2015 reviewed: no acute process No evidence for granulomatous lung disease       Assessment & Plan:   Asthma, moderate persistent Moderate persistent asthma without evidence of frank pneumoconiosis on CT scan of chest. 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 However no evidence for granulomatous lung disease seen on chest CT scan. The patient likely has reactive airways  disease on the basis of atopic features. Also reflux disease playing a role. Plan Obtain hypersensitivity profile and IgE and also allergy panel Pre and Post Spirometry at [facility] Follow Reflux Diet Stop Protonix Start Zegerid once daily Stay on Proair as needed Follow up with Dr. Joya Gaskins in 2 months    Updated Medication List Outpatient Encounter Prescriptions as of 03/23/2014  Medication Sig  . albuterol (PROVENTIL HFA;VENTOLIN HFA) 108 (90 BASE) MCG/ACT inhaler Inhale 1 puff into the lungs every 6 (six) hours as needed for wheezing or shortness of breath.  . allopurinol (ZYLOPRIM) 300 MG tablet Take 300 mg by mouth daily.  Marland Kitchen aspirin EC 81 MG EC tablet Take 1 tablet (81 mg total) by mouth daily.  . Biotin 5000 MCG CAPS Take 1 capsule by mouth daily.  . carvedilol (COREG) 3.125 MG tablet Take 1 tablet (3.125 mg total) by mouth 2 (two) times daily with a meal.  . clopidogrel (PLAVIX) 75 MG tablet Take 1 tablet (75 mg total) by mouth daily.  . colchicine 0.6 MG tablet Take 0.6 mg by mouth daily.  . furosemide (LASIX) 20 MG tablet Take 20 mg by mouth daily.  . insulin aspart (NOVOLOG) 100 UNIT/ML injection Before each meal 3 times a day , 140-199 - 4 units, 200-250 - 6 units, 251-299 - 8 units,  300-349 - 10 units,  350 or above 12 units.Insulin PEN if approved, provide syringes and needles if needed.  . insulin detemir (LEVEMIR) 100 UNIT/ML injection Inject 0.25 mLs (25 Units total) into the skin 2 (two) times daily. Syringes and needles as needed  . Insulin Syringe-Needle U-100 25G X 1" 1 ML MISC Dispense for taking Levemir and Novolog 1 month supply, if PEN not approved.  . isosorbide mononitrate (IMDUR) 30 MG 24 hr tablet Take 1 tablet (30 mg total) by mouth daily.  Marland Kitchen lisinopril (PRINIVIL,ZESTRIL)  2.5 MG tablet Take 1 tablet (2.5 mg total) by mouth daily.  . Multiple Vitamin (MULTIVITAMIN) tablet Take 1 tablet by mouth daily.  . nitroGLYCERIN (NITROSTAT) 0.4 MG SL tablet Place  0.4 mg under the tongue every 5 (five) minutes as needed for chest pain.  Marland Kitchen NOVOTWIST 32G X 5 MM MISC   . ONE TOUCH ULTRA TEST test strip   . rosuvastatin (CRESTOR) 10 MG tablet Take 1 tablet (10 mg total) by mouth every evening.  . thyroid (ARMOUR) 60 MG tablet Take 120 mg by mouth daily.   . Vitamin D, Ergocalciferol, (DRISDOL) 50000 UNITS CAPS capsule Take 50,000 Units by mouth every 7 (seven) days.  . [DISCONTINUED] colchicine 0.6 MG tablet Take 1 tablet (0.6 mg total) by mouth 2 (two) times daily.  . [DISCONTINUED] pantoprazole (PROTONIX) 40 MG tablet Take 1 tablet (40 mg total) by mouth daily.  Marland Kitchen omeprazole-sodium bicarbonate (ZEGERID) 40-1100 MG per capsule Take 1 capsule by mouth daily before breakfast.  . [DISCONTINUED] atorvastatin (LIPITOR) 80 MG tablet

## 2014-03-24 NOTE — Telephone Encounter (Signed)
Spoke with pt - 2 month OV scheduled for Nov 24. 2015 at 10:30 am in [place]. Pt confirmed appt and voiced no further questions or concerns at this time.

## 2014-03-25 ENCOUNTER — Other Ambulatory Visit: Payer: Self-pay | Admitting: Internal Medicine

## 2014-03-30 ENCOUNTER — Telehealth: Payer: Self-pay | Admitting: Critical Care Medicine

## 2014-03-30 DIAGNOSIS — J4540 Moderate persistent asthma, uncomplicated: Secondary | ICD-10-CM

## 2014-03-30 NOTE — Telephone Encounter (Signed)
Tell pt pfts show mild airway obstruction with improvement with bronchodilators No change in medications offered

## 2014-03-30 NOTE — Telephone Encounter (Signed)
Also, we received IgE results done on 03/23/14 at [facility]. Per PW, tell pt IgE is normal.  Called, spoke with pt.  Informed her of lab and PFT results and recs per Dr. Joya Gaskins.  She verbalized understanding and voiced no further questions or concerns at this time.  PFT and IgE results placed in scan folder.

## 2014-03-30 NOTE — Telephone Encounter (Signed)
Spoke with pt. She has not contacted assistance program yet but will do so today. She still has samples of Crestor. She will let us know if she is accepted or denied for assistance program.

## 2014-04-01 NOTE — Telephone Encounter (Signed)
Pt called to let us know company is sending her an assistance program application

## 2014-04-08 ENCOUNTER — Encounter: Payer: Self-pay | Admitting: Endocrinology

## 2014-04-08 ENCOUNTER — Ambulatory Visit (INDEPENDENT_AMBULATORY_CARE_PROVIDER_SITE_OTHER): Payer: Medicare Other | Admitting: Endocrinology

## 2014-04-08 VITALS — BP 122/64 | HR 63 | Temp 98.2°F | Ht 67.5 in | Wt 191.0 lb

## 2014-04-08 DIAGNOSIS — E1165 Type 2 diabetes mellitus with hyperglycemia: Secondary | ICD-10-CM

## 2014-04-08 DIAGNOSIS — N189 Chronic kidney disease, unspecified: Secondary | ICD-10-CM

## 2014-04-08 DIAGNOSIS — E1122 Type 2 diabetes mellitus with diabetic chronic kidney disease: Secondary | ICD-10-CM

## 2014-04-08 DIAGNOSIS — IMO0002 Reserved for concepts with insufficient information to code with codable children: Secondary | ICD-10-CM

## 2014-04-08 DIAGNOSIS — E118 Type 2 diabetes mellitus with unspecified complications: Secondary | ICD-10-CM

## 2014-04-08 MED ORDER — INSULIN DETEMIR 100 UNIT/ML FLEXPEN: 60.0000 [IU] | PEN_INJECTOR | SUBCUTANEOUS | Status: DC

## 2014-04-08 NOTE — Progress Notes (Signed)
Subjective:    Patient ID: Carol [name], female    DOB: October 21, 1935, 78 y.o.   MRN: [id number]  HPI pt states DM was dx'ed in 1987; he has moderate neuropathy of the lower extremities; he has associated renal failure, retinopathy, and CAD; he has been on insulin since MI in mid-2015; pt says her diet is "usually good," and exercise is directed by cardiac rehab; she has never had GDM, pancreatitis, severe hypoglycemia or DKA.  She had hyperglycemia hyperosmolar state with one of her hospitalizations in 2015.  She takes bid levemir and prn novolog (averages 4 units tid).  she brings a record of her cbg's which i have reviewed today. It varies from 87-200.  It is in general higher as the day goes on.  She wants to take insulin only qd.  Past Medical History  Diagnosis Date  . Hypertension   . Asthma   . Hyperlipidemia     a. H/o intolerance to Zocor, Lipitor. Had muscle aches on higher dose Crestor.  . Gout, unspecified   . CAD S/P percutaneous coronary angioplasty 01/22/2014    A. (12/2013): POBA - OM2 99% (very tortuous segment) - reduced ~ 50% & TIMI 3 flow, 80-90% stenosis in mid PDA, Irregularities <50% in mRCA, pLAD and prox LCx; b. 02/17/14: Moderate sized fixed defect in Lateral wall --> cath with OM2 restenosis & otherwise stable --> Xience Alpine DES x 2 (2.25 mm x 12 & 8 mm). c. NSTEMI 02/2014 s/p PTCA/balloon angioplasty only to small caliber PDA.  Marland Kitchen Chronic systolic heart failure     a. ICM b. ECHO (01/2014): EF 25-30%, grade I DD, trivial MR. c. EF by Myoview 02/17/14 = ~53%. d. EF by cath 02/24/14 - improved to 50-55%.  . NSTEMI (non-ST elevated myocardial infarction) 2014; 12/2013, 01/2014  . Environmental allergies   . Hyperthyroidism   . Type II diabetes mellitus   . GERD (gastroesophageal reflux disease)   . TIA (transient ischemic attack) 1990's    a. TIA vs stroke 1990's "mild" - sounds like a TIA as she was told she had stroke symptoms but negative scans. Denies residual effects.   . Contrast media allergy   . QT prolongation     a. Noted on EKG 02/2014.  Marland Kitchen Anemia     a. Noted on labs 02/2014 possibly procedurally related.  Marland Kitchen LBBB (left bundle branch block)     a. Transient during 02/2014 admission.  . CKD (chronic kidney disease), stage III   . Sinus bradycardia     a. HR 40s-50s during 02/2014 admission, limiting BB dose.    Past Surgical History  Procedure Laterality Date  . Shoulder open rotator cuff repair Bilateral 1980's - 1990's  . Total knee arthroplasty Right 2009  . Cesarean section  [id number]; 1960  . Abdominal hysterectomy    . Appendectomy  1959  . Cholecystectomy  1989  . Joint replacement    . Reduction mammaplasty Bilateral 1990's  . Cataract extraction w/ intraocular lens  implant, bilateral Bilateral 2013  . Coronary angioplasty  12/2013    POBA - OM2  . Percutaneous coronary stent intervention (pci-s)  02/17/14    For restenosis of OM2 - PCI with Xience Apline DES 2.25 mm x 12 mm & 2.25 mm x 8 mm overlapping    History   Social History  . Marital Status: Widowed    Spouse Name: N/A    Number of Children: 2  . Years of Education: N/A  Occupational History  . Retired    Social History Main Topics  . Smoking status: Never Smoker   . Smokeless tobacco: Never Used  . Alcohol Use: No  . Drug Use: No  . Sexual Activity: No   Other Topics Concern  . Not on file   Social History Narrative   Lives in [place] by herself.     Current Outpatient Prescriptions on File Prior to Visit  Medication Sig Dispense Refill  . albuterol (PROVENTIL HFA;VENTOLIN HFA) 108 (90 BASE) MCG/ACT inhaler Inhale 1 puff into the lungs every 6 (six) hours as needed for wheezing or shortness of breath.      . allopurinol (ZYLOPRIM) 300 MG tablet Take 300 mg by mouth daily.      Marland Kitchen aspirin EC 81 MG EC tablet Take 1 tablet (81 mg total) by mouth daily.  30 tablet  3  . Biotin 5000 MCG CAPS Take 1 capsule by mouth daily.      . carvedilol (COREG) 3.125 MG tablet  Take 1 tablet (3.125 mg total) by mouth 2 (two) times daily with a meal.  60 tablet  3  . clopidogrel (PLAVIX) 75 MG tablet Take 1 tablet (75 mg total) by mouth daily.  30 tablet  11  . colchicine 0.6 MG tablet Take 0.6 mg by mouth daily.      . furosemide (LASIX) 20 MG tablet Take 20 mg by mouth daily.      . Insulin Syringe-Needle U-100 25G X 1" 1 ML MISC Dispense for taking Levemir and Novolog 1 month supply, if PEN not approved.  30 each  0  . isosorbide mononitrate (IMDUR) 30 MG 24 hr tablet Take 1 tablet (30 mg total) by mouth daily.  30 tablet  3  . lisinopril (PRINIVIL,ZESTRIL) 2.5 MG tablet Take 1 tablet (2.5 mg total) by mouth daily.  30 tablet  0  . Multiple Vitamin (MULTIVITAMIN) tablet Take 1 tablet by mouth daily.      . nitroGLYCERIN (NITROSTAT) 0.4 MG SL tablet Place 0.4 mg under the tongue every 5 (five) minutes as needed for chest pain.      Marland Kitchen NOVOTWIST 32G X 5 MM MISC       . omeprazole-sodium bicarbonate (ZEGERID) 40-1100 MG per capsule Take 1 capsule by mouth daily before breakfast.  30 capsule  6  . ONE TOUCH ULTRA TEST test strip       . rosuvastatin (CRESTOR) 10 MG tablet Take 1 tablet (10 mg total) by mouth every evening.  30 tablet  3  . thyroid (ARMOUR) 60 MG tablet Take 120 mg by mouth daily.       . Vitamin D, Ergocalciferol, (DRISDOL) 50000 UNITS CAPS capsule Take 50,000 Units by mouth every 7 (seven) days.       No current facility-administered medications on file prior to visit.    Allergies  Allergen Reactions  . Contrast Media [Iodinated Diagnostic Agents] Other (See Comments)    unknown  . Cortizone-10 [Hydrocortisone] Other (See Comments)    unknown  . Lipitor [Atorvastatin]     Muscle aches- severe  . Zocor [Simvastatin]     Muscle aches- severe    Family History  Problem Relation Age of Onset  . Hypertension Mother   . Diabetes Mother   . Heart attack Mother 76  . Heart disease Father   . Diabetes Brother   . Heart disease Brother   .  Cancer Sister   . Emphysema Father  BP 122/64  Pulse 63  Temp(Src) 98.2 F (36.8 C) (Oral)  Ht 5' 7.5" (1.715 m)  Wt 191 lb (86.637 kg)  BMI 29.46 kg/m2  SpO2 97%     Review of Systems denies blurry vision, headache, chest pain, sob, n/v, urinary frequency, muscle cramps, excessive diaphoresis, and depression.  She has weight gain, rhinorrhea, easy bruising, and cold intolerance.      Objective:   Physical Exam VS: see vs page GEN: no distress HEAD: head: no deformity eyes: no periorbital swelling, no proptosis external nose and ears are normal mouth: no lesion seen Ears: bilat hearing aids NECK: supple, thyroid is not enlarged CHEST WALL: no deformity LUNGS:  Clear to auscultation CV: reg rate and rhythm, no murmur ABD: abdomen is soft, nontender.  no hepatosplenomegaly.  not distended.  no hernia MUSCULOSKELETAL: muscle bulk and strength are grossly normal.  no obvious joint swelling.  gait is normal and steady EXTEMITIES: no deformity.  no ulcer on the feet.  feet are of normal color and temp.  1+ bilat leg edema.  There is bilateral onychomycosis PULSES: dorsalis pedis intact bilat.  no carotid bruit NEURO:  cn 2-12 grossly intact, except for hearing loss.  readily moves all 4's.  sensation is intact to touch on the feet SKIN:  Normal texture and temperature.  No rash or suspicious lesion is visible.   NODES:  None palpable at the neck PSYCH: alert, well-oriented.  Does not appear anxious nor depressed.   Lab Results  Component Value Date   HGBA1C 9.1* 01/22/2014   i reviewed electrocardiogram (03/04/14)  i have reviewed the following old records: hosp d/c summary     Assessment & Plan:  DM: new to me.  She needs increased rx   Patient is advised the following: Patient Instructions  good diet and exercise habits significanly improve the control of your diabetes.  please let me know if you wish to be referred to a dietician.  high blood sugar is very  risky to your health.  you should see an eye doctor and dentist every year.  It is very important to get all recommended vaccinations.  controlling your blood pressure and cholesterol drastically reduces the damage diabetes does to your body.  this also applies to quitting smoking.  please discuss these with your doctor.  check your blood sugar twice a day.  vary the time of day when you check, between before the 3 meals, and at bedtime.  also check if you have symptoms of your blood sugar being too high or too low.  please keep a record of the readings and bring it to your next appointment here.  You can write it on any piece of paper.  please call us sooner if your blood sugar goes below 70, or if you have a lot of readings over 200. Please stop taking the novolog, and: Take levemir, 60 units each morning (none in the evening) Please come back for a follow-up appointment in 2 weeks.

## 2014-04-08 NOTE — Patient Instructions (Addendum)
good diet and exercise habits significanly improve the control of your diabetes.  please let me know if you wish to be referred to a dietician.  high blood sugar is very risky to your health.  you should see an eye doctor and dentist every year.  It is very important to get all recommended vaccinations.  controlling your blood pressure and cholesterol drastically reduces the damage diabetes does to your body.  this also applies to quitting smoking.  please discuss these with your doctor.  check your blood sugar twice a day.  vary the time of day when you check, between before the 3 meals, and at bedtime.  also check if you have symptoms of your blood sugar being too high or too low.  please keep a record of the readings and bring it to your next appointment here.  You can write it on any piece of paper.  please call us sooner if your blood sugar goes below 70, or if you have a lot of readings over 200. Please stop taking the novolog, and: Take levemir, 60 units each morning (none in the evening) Please come back for a follow-up appointment in 2 weeks.

## 2014-04-10 DIAGNOSIS — E1140 Type 2 diabetes mellitus with diabetic neuropathy, unspecified: Secondary | ICD-10-CM | POA: Insufficient documentation

## 2014-04-10 DIAGNOSIS — E1165 Type 2 diabetes mellitus with hyperglycemia: Secondary | ICD-10-CM | POA: Insufficient documentation

## 2014-04-10 DIAGNOSIS — E119 Type 2 diabetes mellitus without complications: Secondary | ICD-10-CM | POA: Insufficient documentation

## 2014-04-13 ENCOUNTER — Encounter: Payer: Self-pay | Admitting: Critical Care Medicine

## 2014-04-16 ENCOUNTER — Telehealth: Payer: Self-pay | Admitting: Endocrinology

## 2014-04-16 NOTE — Telephone Encounter (Signed)
Please reduce th 50 units qam i'll see you next time.

## 2014-04-16 NOTE — Telephone Encounter (Signed)
Pt advised of note below and voiced understanding.  

## 2014-04-16 NOTE — Telephone Encounter (Signed)
pts blood sugars have been blow 70 the last 3 mornings Today 64 Yesterday 37 Wednesday 68

## 2014-04-16 NOTE — Telephone Encounter (Signed)
See below, pt is taking 60 units of levemir every morning.  Thanks!

## 2014-04-23 ENCOUNTER — Encounter: Payer: Self-pay | Admitting: Endocrinology

## 2014-04-23 ENCOUNTER — Ambulatory Visit (INDEPENDENT_AMBULATORY_CARE_PROVIDER_SITE_OTHER): Payer: Medicare Other | Admitting: Endocrinology

## 2014-04-23 VITALS — BP 134/80 | HR 78 | Temp 97.8°F | Ht 67.5 in | Wt 193.0 lb

## 2014-04-23 DIAGNOSIS — E1122 Type 2 diabetes mellitus with diabetic chronic kidney disease: Secondary | ICD-10-CM

## 2014-04-23 DIAGNOSIS — N189 Chronic kidney disease, unspecified: Secondary | ICD-10-CM

## 2014-04-23 LAB — HEMOGLOBIN A1C: Hgb A1c MFr Bld: 8.2 % — ABNORMAL HIGH (ref 4.6–6.5)

## 2014-04-23 NOTE — Patient Instructions (Addendum)
check your blood sugar twice a day.  vary the time of day when you check, between before the 3 meals, and at bedtime.  also check if you have symptoms of your blood sugar being too high or too low.  please keep a record of the readings and bring it to your next appointment here.  You can write it on any piece of paper.  please call us sooner if your blood sugar goes below 70, or if you have a lot of readings over 200. blood tests are being requested for you today.  We'll contact you with results.  Please continue levemir, 50 units each morning (none in the evening) Please come back for a follow-up appointment in 3 months.  Let me know if you want to change to the cheaper [facility] insulin.

## 2014-04-23 NOTE — Progress Notes (Signed)
Subjective:    Patient ID: Carol [name], female    DOB: 21-Sep-1935, 78 y.o.   MRN: [id number]  HPI Pt returns for f/u of diabetes mellitus: DM type: Insulin-requiring type 2 Dx'ed: [id number] Complications: polyneuropathy, renal failure, retinopathy, and CAD Therapy: insulin since MI in mid-2015 GDM: never DKA: never Severe hypoglycemia: never Pancreatitis: never Other: she had hyperglycemia hyperosmolar state with one of her hospitalizations in 2015; She wants to take insulin only qd. Interval history: Since the levemir was reduced from 60 to 50 units qd, she has had no further hypoglycemia.  no cbg record, but states cbg's are well-controlled.  It is in general higher as the day goes on.  She says the insulin is very expensive.   Past Medical History  Diagnosis Date  . Hypertension   . Asthma   . Hyperlipidemia     a. H/o intolerance to Zocor, Lipitor. Had muscle aches on higher dose Crestor.  . Gout, unspecified   . CAD S/P percutaneous coronary angioplasty 01/22/2014    A. (12/2013): POBA - OM2 99% (very tortuous segment) - reduced ~ 50% & TIMI 3 flow, 80-90% stenosis in mid PDA, Irregularities <50% in mRCA, pLAD and prox LCx; b. 02/17/14: Moderate sized fixed defect in Lateral wall --> cath with OM2 restenosis & otherwise stable --> Xience Alpine DES x 2 (2.25 mm x 12 & 8 mm). c. NSTEMI 02/2014 s/p PTCA/balloon angioplasty only to small caliber PDA.  Marland Kitchen Chronic systolic heart failure     a. ICM b. ECHO (01/2014): EF 25-30%, grade I DD, trivial MR. c. EF by Myoview 02/17/14 = ~53%. d. EF by cath 02/24/14 - improved to 50-55%.  . NSTEMI (non-ST elevated myocardial infarction) 2014; 12/2013, 01/2014  . Environmental allergies   . Hyperthyroidism   . Type II diabetes mellitus   . GERD (gastroesophageal reflux disease)   . TIA (transient ischemic attack) 1990's    a. TIA vs stroke 1990's "mild" - sounds like a TIA as she was told she had stroke symptoms but negative scans. Denies residual  effects.  . Contrast media allergy   . QT prolongation     a. Noted on EKG 02/2014.  Marland Kitchen Anemia     a. Noted on labs 02/2014 possibly procedurally related.  Marland Kitchen LBBB (left bundle branch block)     a. Transient during 02/2014 admission.  . CKD (chronic kidney disease), stage III   . Sinus bradycardia     a. HR 40s-50s during 02/2014 admission, limiting BB dose.    Past Surgical History  Procedure Laterality Date  . Shoulder open rotator cuff repair Bilateral 1980's - 1990's  . Total knee arthroplasty Right 2009  . Cesarean section  [id number]; 1960  . Abdominal hysterectomy    . Appendectomy  1959  . Cholecystectomy  1989  . Joint replacement    . Reduction mammaplasty Bilateral 1990's  . Cataract extraction w/ intraocular lens  implant, bilateral Bilateral 2013  . Coronary angioplasty  12/2013    POBA - OM2  . Percutaneous coronary stent intervention (pci-s)  02/17/14    For restenosis of OM2 - PCI with Xience Apline DES 2.25 mm x 12 mm & 2.25 mm x 8 mm overlapping    History   Social History  . Marital Status: Widowed    Spouse Name: N/A    Number of Children: 2  . Years of Education: N/A   Occupational History  . Retired    Social History Main Topics  .  Smoking status: Never Smoker   . Smokeless tobacco: Never Used  . Alcohol Use: No  . Drug Use: No  . Sexual Activity: No   Other Topics Concern  . Not on file   Social History Narrative   Lives in [place] by herself.     Current Outpatient Prescriptions on File Prior to Visit  Medication Sig Dispense Refill  . albuterol (PROVENTIL HFA;VENTOLIN HFA) 108 (90 BASE) MCG/ACT inhaler Inhale 1 puff into the lungs every 6 (six) hours as needed for wheezing or shortness of breath.      . allopurinol (ZYLOPRIM) 300 MG tablet Take 300 mg by mouth daily.      Marland Kitchen aspirin EC 81 MG EC tablet Take 1 tablet (81 mg total) by mouth daily.  30 tablet  3  . Biotin 5000 MCG CAPS Take 1 capsule by mouth daily.      . carvedilol (COREG) 3.125  MG tablet Take 1 tablet (3.125 mg total) by mouth 2 (two) times daily with a meal.  60 tablet  3  . clopidogrel (PLAVIX) 75 MG tablet Take 1 tablet (75 mg total) by mouth daily.  30 tablet  11  . colchicine 0.6 MG tablet Take 0.6 mg by mouth daily.      . furosemide (LASIX) 20 MG tablet Take 20 mg by mouth daily.      . Insulin Detemir (LEVEMIR) 100 UNIT/ML Pen Inject 50 Units into the skin every morning. and 31g pen needles, 2/day      . Insulin Syringe-Needle U-100 25G X 1" 1 ML MISC Dispense for taking Levemir and Novolog 1 month supply, if PEN not approved.  30 each  0  . isosorbide mononitrate (IMDUR) 30 MG 24 hr tablet Take 1 tablet (30 mg total) by mouth daily.  30 tablet  3  . lisinopril (PRINIVIL,ZESTRIL) 2.5 MG tablet Take 1 tablet (2.5 mg total) by mouth daily.  30 tablet  0  . Multiple Vitamin (MULTIVITAMIN) tablet Take 1 tablet by mouth daily.      . nitroGLYCERIN (NITROSTAT) 0.4 MG SL tablet Place 0.4 mg under the tongue every 5 (five) minutes as needed for chest pain.      Marland Kitchen NOVOTWIST 32G X 5 MM MISC       . ONE TOUCH ULTRA TEST test strip       . rosuvastatin (CRESTOR) 10 MG tablet Take 1 tablet (10 mg total) by mouth every evening.  30 tablet  3  . thyroid (ARMOUR) 60 MG tablet Take 120 mg by mouth daily.       . Vitamin D, Ergocalciferol, (DRISDOL) 50000 UNITS CAPS capsule Take 50,000 Units by mouth every 7 (seven) days.       No current facility-administered medications on file prior to visit.    Allergies  Allergen Reactions  . Contrast Media [Iodinated Diagnostic Agents] Other (See Comments)    unknown  . Cortizone-10 [Hydrocortisone] Other (See Comments)    unknown  . Lipitor [Atorvastatin]     Muscle aches- severe  . Zocor [Simvastatin]     Muscle aches- severe    Family History  Problem Relation Age of Onset  . Hypertension Mother   . Diabetes Mother   . Heart attack Mother 16  . Heart disease Father   . Diabetes Brother   . Heart disease Brother   .  Cancer Sister   . Emphysema Father     BP 134/80  Pulse 78  Temp(Src) 97.8 F (36.6 C) (  Oral)  Ht 5' 7.5" (1.715 m)  Wt 193 lb (87.544 kg)  BMI 29.76 kg/m2  SpO2 98%   Review of Systems Denies n/v/loc.      Objective:   Physical Exam VITAL SIGNS:  See vs page GENERAL: no distress PSYCH: Alert and well-oriented.  Does not appear anxious nor depressed.      Assessment & Plan:  DM: apparently well-controlled Noncompliance with cbg recording: I'll work around this as best I can   Patient is advised the following: Patient Instructions  check your blood sugar twice a day.  vary the time of day when you check, between before the 3 meals, and at bedtime.  also check if you have symptoms of your blood sugar being too high or too low.  please keep a record of the readings and bring it to your next appointment here.  You can write it on any piece of paper.  please call us sooner if your blood sugar goes below 70, or if you have a lot of readings over 200. blood tests are being requested for you today.  We'll contact you with results.  Please continue levemir, 50 units each morning (none in the evening) Please come back for a follow-up appointment in 3 months.  Let me know if you want to change to the cheaper [facility] insulin.

## 2014-05-04 ENCOUNTER — Telehealth: Payer: Self-pay | Admitting: Endocrinology

## 2014-05-04 MED ORDER — INSULIN NPH (HUMAN) (ISOPHANE) 100 UNIT/ML ~~LOC~~ SUSP: 35.0000 [IU] | SUBCUTANEOUS | Status: DC

## 2014-05-04 NOTE — Telephone Encounter (Signed)
Patient was advised by Dr. Loanne Drilling that when she finished her insulin pen she was to call office and get new rx  Switching from pen to vial Also, patient would need syringes   Please advise   Thank you

## 2014-05-04 NOTE — Telephone Encounter (Signed)
See below is this for the [facility] brand? Thanks!

## 2014-05-04 NOTE — Telephone Encounter (Signed)
Pt advised of note below and voiced understanding.  

## 2014-05-04 NOTE — Telephone Encounter (Signed)
Ok, i have sent a [facility] [address]. Most people need fewer units of this compared to levemir.  Let's start at 35 units qam.  This is a starting point, which we will probably need to adjust.  Please let me know if your blood sugar is too high or too low.   i'll see you next time.

## 2014-05-07 NOTE — Telephone Encounter (Signed)
I placed call to pt to follow up on assistance program application. Left message to call back

## 2014-05-13 ENCOUNTER — Telehealth: Payer: Self-pay | Admitting: Cardiovascular Disease

## 2014-05-13 NOTE — Telephone Encounter (Signed)
Spoke with pt. She reports she has completed assistance paperwork and is waiting to hear if approved.

## 2014-05-13 NOTE — Telephone Encounter (Signed)
Spoke with pt. She was changed to Protonix due to being on Plavix. Pulmonary changed to Zegrid but she was not able to afford.  She now states she is having problems with stomach.  Describes as fullness in stomach and chest area.  Not like heart pain. Burping and passing gas. Vomited this AM after eating breakfast. Ate poorly yesterday. She is asking if Dr. Angelena Form can prescribe a different medication for her stomach.  Pt has a primary care MD and I asked her to contact him to have stomach problems evaluated.  Pt agreeable with this plan.

## 2014-05-13 NOTE — Telephone Encounter (Signed)
New message  Pt requests a call back to discuss her stomach medication. She says she is in desperate need for something else  Requests a call back to discuss.

## 2014-05-18 ENCOUNTER — Ambulatory Visit: Payer: Medicare Other | Admitting: Critical Care Medicine

## 2014-05-21 ENCOUNTER — Telehealth: Payer: Self-pay | Admitting: Cardiovascular Disease

## 2014-05-21 NOTE — Telephone Encounter (Signed)
Patient reports she has felt fatigued, yesterday and today, more than usual. Took 1 Nitro last evening for chest heaviness, which relieved it. Reports her l hand and fingers were hurting and felt numb for about 1/2hour, then got better. She wants to know if she should move up her appointment. It is for end of Decenber.

## 2014-05-21 NOTE — Telephone Encounter (Signed)
New Prob   Pt would like to discuss some issues she has been having. Pt did not want to disclose information. Please call.

## 2014-05-24 ENCOUNTER — Telehealth: Payer: Self-pay | Admitting: Endocrinology

## 2014-05-24 NOTE — Telephone Encounter (Signed)
Patient states that her blood sugars are too high  Her prior insulin was working  The [employer] N is not working as good as prior insulin; patient feels it needs to be increased    Please advise patient   Thank you

## 2014-05-24 NOTE — Telephone Encounter (Signed)
Fraser Din, Can you check on her today? If she is feeling better, no need to move things up but if she is having recurrent symptoms, I would be glad to see her sooner. Gerald Stabs

## 2014-05-24 NOTE — Telephone Encounter (Signed)
Spoke with pt. She still feels tired but symptoms have not occurred again since Thursday evening. Went to cardiac rehab today and was able to exercise without problems.  She does not feel she needs sooner appt and will keep appt with Dr. Angelena Form for December 31. She will call if she has symptoms and would like to be seen sooner.

## 2014-05-24 NOTE — Telephone Encounter (Signed)
Left message to call back  

## 2014-05-25 NOTE — Telephone Encounter (Signed)
Contacted pt she states in the morning and in the evening her blood sugars have been running hight. Sugar ranges from 110 to 160. Confirmed that pt is taking 25 units of Novolin every morning. Pt wanted to know if we should increase dosage.  Please advise, Thanks!

## 2014-05-25 NOTE — Telephone Encounter (Signed)
Pt is calling you back 

## 2014-05-25 NOTE — Telephone Encounter (Signed)
Ok, please increase to 30 units qam i'll see you next time.

## 2014-05-25 NOTE — Telephone Encounter (Signed)
Requested call back from pt to discuss.

## 2014-05-26 NOTE — Telephone Encounter (Signed)
Contacted pt and advised below. Pt stated yesterday when we spoke she got her dosage confused. She is currently taking 35 units she thought it was 25 units. Pt wanted to know if she should increase to 40 units since she is already taking 35 units.  Please advise, Thanks!

## 2014-05-26 NOTE — Telephone Encounter (Signed)
Pt advised.

## 2014-05-26 NOTE — Telephone Encounter (Signed)
Ok, please increase to 40 units

## 2014-06-03 ENCOUNTER — Encounter (HOSPITAL_COMMUNITY): Payer: Self-pay | Admitting: Interventional Cardiology

## 2014-06-22 ENCOUNTER — Other Ambulatory Visit (HOSPITAL_COMMUNITY): Payer: Self-pay | Admitting: Anesthesiology

## 2014-06-24 ENCOUNTER — Encounter: Payer: Self-pay | Admitting: Cardiovascular Disease

## 2014-06-24 ENCOUNTER — Ambulatory Visit (INDEPENDENT_AMBULATORY_CARE_PROVIDER_SITE_OTHER): Payer: Medicare Other | Admitting: Cardiovascular Disease

## 2014-06-24 VITALS — BP 130/72 | HR 63 | Ht 67.0 in | Wt 195.8 lb

## 2014-06-24 DIAGNOSIS — I10 Essential (primary) hypertension: Secondary | ICD-10-CM

## 2014-06-24 DIAGNOSIS — E785 Hyperlipidemia, unspecified: Secondary | ICD-10-CM

## 2014-06-24 DIAGNOSIS — I2510 Atherosclerotic heart disease of native coronary artery without angina pectoris: Secondary | ICD-10-CM

## 2014-06-24 DIAGNOSIS — K219 Gastro-esophageal reflux disease without esophagitis: Secondary | ICD-10-CM

## 2014-06-24 DIAGNOSIS — I5022 Chronic systolic (congestive) heart failure: Secondary | ICD-10-CM

## 2014-06-24 MED ORDER — CARVEDILOL 3.125 MG PO TABS
3.1250 mg | ORAL_TABLET | Freq: Two times a day (BID) | ORAL | Status: DC
Start: 2014-06-24 — End: 2015-07-22

## 2014-06-24 MED ORDER — FAMOTIDINE 10 MG PO TABS: 10.0000 mg | ORAL_TABLET | Freq: Two times a day (BID) | ORAL | Status: DC

## 2014-06-24 NOTE — Patient Instructions (Signed)
Your physician wants you to follow-up in:  6 months.  You will receive a reminder letter in the mail two months in advance. If you don't receive a letter, please call our office to schedule the follow-up appointment.  You have been referred to Dr. Delfin Edis.  Please schedule pt for new pt appt.  Your physician has recommended you make the following change in your medication: Start over the counter Pepcid.  Follow instructions on label.

## 2014-06-24 NOTE — Progress Notes (Signed)
History of Present Illness: 78 yo female with history of CAD, DM, HTN, asthma, hyperthyroidism, HLD who is here today for hospital follow up. I saw her June 2014 as a new patient for evaluation of chest pain. She had a normal stress test and echo in June 2014. She was admitted to [facility] 01/22/14 with NSTEMI. She underwent cardiac cath per Dr. Tamala Julian and he performed balloon angioplasty only of a moderate caliber, tortuous OM2 branch. Severe disease in small PDA was managed medically. LVEF was 25-30% by echo. She was in heart failure and was diuresed. She was readmitted 02/01/14 with weakness but no chest pain. Readmitted 02/15/14 with chest pain.  Troponin was negative but stress myoview with lateral wall defect c/w ischemia. Cardiac cath 02/17/14 and I placed 2 overlapping DES in the second OM branch. I elected to continue to treat the PDA branch medically given small caliber of vessel. She was readmitted 02/24/14 with recurrent chest pain and mildly elevated troponin. Cardiac cath 02/24/14 with patent stents OM2, severe disease PDA. I performed balloon angioplasty on the PDA branch. The vessel was too small for a stent. Also noted that the OM2 branch appears to have a bridging segment with systole just before the stent. LVEF=50-55% by LV gram.   She is here today for follow up. She has been feeling well. No exertional chest pain or SOB. She is having continued symptoms of acid reflux which she has battled for years. Soreness in left chest after meals.   Primary Care Physician: Reinaldo Meeker  Fasting lipid profile:  Followed in primary care  Past Medical History  Diagnosis Date  . Hypertension   . Asthma   . Hyperlipidemia     a. H/o intolerance to Zocor, Lipitor. Had muscle aches on higher dose Crestor.  . Gout, unspecified   . CAD S/P percutaneous coronary angioplasty 01/22/2014    A. (12/2013): POBA - OM2 99% (very tortuous segment) - reduced ~ 50% & TIMI 3 flow, 80-90% stenosis in mid PDA,  Irregularities <50% in mRCA, pLAD and prox LCx; b. 02/17/14: Moderate sized fixed defect in Lateral wall --> cath with OM2 restenosis & otherwise stable --> Xience Alpine DES x 2 (2.25 mm x 12 & 8 mm). c. NSTEMI 02/2014 s/p PTCA/balloon angioplasty only to small caliber PDA.  Marland Kitchen Chronic systolic heart failure     a. ICM b. ECHO (01/2014): EF 25-30%, grade I DD, trivial MR. c. EF by Myoview 02/17/14 = ~53%. d. EF by cath 02/24/14 - improved to 50-55%.  . NSTEMI (non-ST elevated myocardial infarction) 2014; 12/2013, 01/2014  . Environmental allergies   . Hyperthyroidism   . Type II diabetes mellitus   . GERD (gastroesophageal reflux disease)   . TIA (transient ischemic attack) 1990's    a. TIA vs stroke 1990's "mild" - sounds like a TIA as she was told she had stroke symptoms but negative scans. Denies residual effects.  . Contrast media allergy   . QT prolongation     a. Noted on EKG 02/2014.  Marland Kitchen Anemia     a. Noted on labs 02/2014 possibly procedurally related.  Marland Kitchen LBBB (left bundle branch block)     a. Transient during 02/2014 admission.  . CKD (chronic kidney disease), stage III   . Sinus bradycardia     a. HR 40s-50s during 02/2014 admission, limiting BB dose.    Past Surgical History  Procedure Laterality Date  . Shoulder open rotator cuff repair Bilateral 1980's - 1990's  .  Total knee arthroplasty Right 2009  . Cesarean section  [id number]; 1960  . Abdominal hysterectomy    . Appendectomy  1959  . Cholecystectomy  1989  . Joint replacement    . Reduction mammaplasty Bilateral 1990's  . Cataract extraction w/ intraocular lens  implant, bilateral Bilateral 2013  . Coronary angioplasty  12/2013    POBA - OM2  . Percutaneous coronary stent intervention (pci-s)  02/17/14    For restenosis of OM2 - PCI with Xience Apline DES 2.25 mm x 12 mm & 2.25 mm x 8 mm overlapping  . Left heart catheterization with coronary angiogram N/A 01/22/2014    Procedure: LEFT HEART CATHETERIZATION WITH CORONARY ANGIOGRAM;   Surgeon: Sinclair Grooms, MD;  Location: [facility] CATH LAB;  Service: Cardiovascular;  Laterality: N/A;  . Left heart catheterization with coronary angiogram N/A 02/17/2014    Procedure: LEFT HEART CATHETERIZATION WITH CORONARY ANGIOGRAM;  Surgeon: Burnell Blanks, MD;  Location: [facility] CATH LAB;  Service: Cardiovascular;  Laterality: N/A;  . Left heart catheterization with coronary angiogram N/A 02/24/2014    Procedure: LEFT HEART CATHETERIZATION WITH CORONARY ANGIOGRAM;  Surgeon: Burnell Blanks, MD;  Location: [facility] CATH LAB;  Service: Cardiovascular;  Laterality: N/A;  . Cardiac catheterization  02/24/2014    Procedure: CORONARY BALLOON ANGIOPLASTY;  Surgeon: Burnell Blanks, MD;  Location: [facility] CATH LAB;  Service: Cardiovascular;;    Current Outpatient Prescriptions  Medication Sig Dispense Refill  . allopurinol (ZYLOPRIM) 300 MG tablet Take 300 mg by mouth daily.    Marland Kitchen aspirin EC 81 MG EC tablet Take 1 tablet (81 mg total) by mouth daily. 30 tablet 3  . Biotin 5000 MCG CAPS Take 1 capsule by mouth daily.    . carvedilol (COREG) 3.125 MG tablet Take 1 tablet (3.125 mg total) by mouth 2 (two) times daily with a meal. 60 tablet 3  . clopidogrel (PLAVIX) 75 MG tablet Take 1 tablet (75 mg total) by mouth daily. 30 tablet 11  . colchicine 0.6 MG tablet Take 0.6 mg by mouth daily.    . furosemide (LASIX) 20 MG tablet Take 20 mg by mouth daily.    . insulin NPH Human (NOVOLIN N RELION) 100 UNIT/ML injection Inject 0.35 mLs (35 Units total) into the skin every morning. And syringes 1/day (Patient taking differently: Inject 40 Units into the skin every morning. And syringes 1/day) 10 mL 11  . Insulin Syringe-Needle U-100 25G X 1" 1 ML MISC Dispense for taking Levemir and Novolog 1 month supply, if PEN not approved. 30 each 0  . isosorbide mononitrate (IMDUR) 30 MG 24 hr tablet TAKE 1 TABLET BY MOUTH ONCE DAILY 30 tablet 3  . levothyroxine (SYNTHROID, LEVOTHROID) 50 MCG tablet Take 50 mcg by mouth  daily.    Marland Kitchen lisinopril (PRINIVIL,ZESTRIL) 2.5 MG tablet Take 1 tablet (2.5 mg total) by mouth daily. 30 tablet 0  . Multiple Vitamin (MULTIVITAMIN) tablet Take 1 tablet by mouth daily.    . nitroGLYCERIN (NITROSTAT) 0.4 MG SL tablet Place 0.4 mg under the tongue every 5 (five) minutes as needed for chest pain.    Marland Kitchen NOVOTWIST 32G X 5 MM MISC     . ONE TOUCH ULTRA TEST test strip     . pantoprazole (PROTONIX) 40 MG tablet Take 40 mg by mouth daily.    Marland Kitchen PROAIR HFA 108 (90 BASE) MCG/ACT inhaler as needed. For breathing    . rosuvastatin (CRESTOR) 10 MG tablet Take 1 tablet (10 mg total)  by mouth every evening. 30 tablet 3  . thyroid (ARMOUR) 60 MG tablet Take 120 mg by mouth daily.     . Vitamin D, Ergocalciferol, (DRISDOL) 50000 UNITS CAPS capsule Take 50,000 Units by mouth every 7 (seven) days.     No current facility-administered medications for this visit.    Allergies  Allergen Reactions  . Contrast Media [Iodinated Diagnostic Agents] Other (See Comments)    unknown  . Cortizone-10 [Hydrocortisone] Other (See Comments)    unknown  . Lipitor [Atorvastatin]     Muscle aches- severe  . Zocor [Simvastatin]     Muscle aches- severe    History   Social History  . Marital Status: Widowed    Spouse Name: N/A    Number of Children: 2  . Years of Education: N/A   Occupational History  . Retired    Social History Main Topics  . Smoking status: Never Smoker   . Smokeless tobacco: Never Used  . Alcohol Use: No  . Drug Use: No  . Sexual Activity: No   Other Topics Concern  . Not on file   Social History Narrative   Lives in [place] by herself.     Family History  Problem Relation Age of Onset  . Hypertension Mother   . Diabetes Mother   . Heart attack Mother 19  . Heart disease Father   . Diabetes Brother   . Heart disease Brother   . Cancer Sister   . Emphysema Father     Review of Systems:  As stated in the HPI and otherwise negative.   BP 130/72 mmHg  Pulse  63  Ht 5\' 7"  (1.702 m)  Wt 195 lb 12.8 oz (88.814 kg)  BMI 30.66 kg/m2  Physical Examination: General: Well developed, well nourished, NAD HEENT: OP clear, mucus membranes moist SKIN: warm, dry. No rashes. Neuro: No focal deficits Musculoskeletal: Muscle strength 5/5 all ext Psychiatric: Mood and affect normal Neck: No JVD, no carotid bruits, no thyromegaly, no lymphadenopathy. Lungs:Clear bilaterally, no wheezes, rhonci, crackles Cardiovascular: Regular rate and rhythm. No murmurs, gallops or rubs. Abdomen:Soft. Bowel sounds present. Non-tender.  Extremities: No lower extremity edema. Pulses are 2 + in the bilateral DP/PT.  Cardiac cath 02/24/14:  Left main: 20% mid stenosis.  Left Anterior Descending Artery: Large caliber vessel that courses to the apex. The proximal vessel has a 40-50% stenosis. The mid vessel has a focal 30% stenosis. The diagonal branch is small caliber with diffuse mild plaque.  Circumflex Artery: Moderate caliber vessel with a very small caliber intermediate branch, a very small caliber first OM branch and a moderate calliber second obtuse marginal branch. The proximal Circumflex has a focal 30% stenosis. The intermediate branch is very small in caliber with proximal 50% stenosis. The second obtuse marginal branch has patent stents with no restenosis. There appears to be bridging in the proximal OM2 just before the stent, likely as a result of straightening of the vessel with the stents. The mid AV groove Circumflex has a 40% stenosis beyond the takeoff of the second OM branch.  Right Coronary Artery: Large caliber dominant vessel with small caliber PDA. The proximal vessel has a 30% stenosis. The mid vessel has diffuse 40% stenosis. The distal vessel tapers to a small caliber vessel. The PDA is a 1.5 mm vessel. There is a 99% stenosis in the mid PDA.  Left Ventricular Angiogram: LVEF=50-55%.  Impression:  1. Double vessel CAD with patent stents OM2, severe stenosis  small  caliber PDA  2. Preserved LV systolic function  3. Successful PTCA/balloon angioplasty PDA (no stent placed due to small vessel size)  4. Bridging/kinking of vessel in the tortuous OM2 just before the stented segment  Assessment and Plan:   1. CAD:  No recent angina. She is now on ASA and Plavix. Will continue beta blocker, statin, Ace-inh, Imdur.   2. HTN: BP well controlled. No changes  3. DM with vascular complications (CAD): Followed in primary care.   4. HLD: Continue statin.   5. Chronic systolic CHF: She is on a small dose of Lasix. LV function is now normal. Will continue low dose Lasix.   6. GERD: She has severe GERD symptoms. Will add Pepcid 20 mg daily to use with Protonix 40 mg daily until she is seen in GI by Dr. Olevia Perches. Will refer to see Dr. Olevia Perches in GI clinic.

## 2014-07-26 ENCOUNTER — Ambulatory Visit (INDEPENDENT_AMBULATORY_CARE_PROVIDER_SITE_OTHER): Payer: Medicare Other | Admitting: Endocrinology

## 2014-07-26 VITALS — BP 134/74 | HR 71 | Temp 97.9°F | Ht 67.0 in | Wt 198.0 lb

## 2014-07-26 DIAGNOSIS — N189 Chronic kidney disease, unspecified: Secondary | ICD-10-CM | POA: Diagnosis not present

## 2014-07-26 DIAGNOSIS — E1122 Type 2 diabetes mellitus with diabetic chronic kidney disease: Secondary | ICD-10-CM

## 2014-07-26 LAB — MICROALBUMIN / CREATININE URINE RATIO
Creatinine,U: 24.6 mg/dL
MICROALB UR: 2.1 mg/dL — AB (ref 0.0–1.9)
Microalb Creat Ratio: 8.5 mg/g (ref 0.0–30.0)

## 2014-07-26 LAB — LIPID PANEL
Cholesterol: 176 mg/dL (ref 0–200)
HDL: 42.9 mg/dL (ref >39.00)
LDL CALC: 94 mg/dL (ref 0–99)
NonHDL: 133.1
Total CHOL/HDL Ratio: 4
Triglycerides: 195 mg/dL — ABNORMAL HIGH (ref 0.0–149.0)
VLDL: 39 mg/dL (ref 0.0–40.0)

## 2014-07-26 LAB — HEMOGLOBIN A1C: HEMOGLOBIN A1C: 8.3 % — AB (ref 4.6–6.5)

## 2014-07-26 MED ORDER — INSULIN NPH (HUMAN) (ISOPHANE) 100 UNIT/ML ~~LOC~~ SUSP: 35.0000 [IU] | SUBCUTANEOUS | Status: DC

## 2014-07-26 NOTE — Patient Instructions (Addendum)
check your blood sugar twice a day.  vary the time of day when you check, between before the 3 meals, and at bedtime.  also check if you have symptoms of your blood sugar being too high or too low.  please keep a record of the readings and bring it to your next appointment here.  You can write it on any piece of paper.  please call us sooner if your blood sugar goes below 70, or if you have a lot of readings over 200. blood tests are being requested for you today.  We'll contact you with results. Please come back for a follow-up appointment in 3 months.   

## 2014-07-26 NOTE — Progress Notes (Signed)
Subjective:    Patient ID: Carol [name], female    DOB: 06/07/1936, 79 y.o.   MRN: [id number]  HPI Pt returns for f/u of diabetes mellitus: DM type: Insulin-requiring type 2 Dx'ed: [id number] Complications: polyneuropathy, renal failure, retinopathy, and CAD. Therapy: insulin since MI in mid-2015.  GDM: never.   DKA: never Severe hypoglycemia: never Pancreatitis: never Other: she had hyperglycemia hyperosmolar state with one of her hospitalizations in 2015; She wants to take insulin only qd; she takes human insulin, due to cost.   Interval history: she brings a record of her cbg's which i have reviewed today.  It varies from 53-200.  It is in general higher as the day goes on, but is is lower with activity.   Past Medical History  Diagnosis Date  . Hypertension   . Asthma   . Hyperlipidemia     a. H/o intolerance to Zocor, Lipitor. Had muscle aches on higher dose Crestor.  . Gout, unspecified   . CAD S/P percutaneous coronary angioplasty 01/22/2014    A. (12/2013): POBA - OM2 99% (very tortuous segment) - reduced ~ 50% & TIMI 3 flow, 80-90% stenosis in mid PDA, Irregularities <50% in mRCA, pLAD and prox LCx; b. 02/17/14: Moderate sized fixed defect in Lateral wall --> cath with OM2 restenosis & otherwise stable --> Xience Alpine DES x 2 (2.25 mm x 12 & 8 mm). c. NSTEMI 02/2014 s/p PTCA/balloon angioplasty only to small caliber PDA.  Marland Kitchen Chronic systolic heart failure     a. ICM b. ECHO (01/2014): EF 25-30%, grade I DD, trivial MR. c. EF by Myoview 02/17/14 = ~53%. d. EF by cath 02/24/14 - improved to 50-55%.  . NSTEMI (non-ST elevated myocardial infarction) 2014; 12/2013, 01/2014  . Environmental allergies   . Hyperthyroidism   . Type II diabetes mellitus   . GERD (gastroesophageal reflux disease)   . TIA (transient ischemic attack) 1990's    a. TIA vs stroke 1990's "mild" - sounds like a TIA as she was told she had stroke symptoms but negative scans. Denies residual effects.  . Contrast media  allergy   . QT prolongation     a. Noted on EKG 02/2014.  Marland Kitchen Anemia     a. Noted on labs 02/2014 possibly procedurally related.  Marland Kitchen LBBB (left bundle branch block)     a. Transient during 02/2014 admission.  . CKD (chronic kidney disease), stage III   . Sinus bradycardia     a. HR 40s-50s during 02/2014 admission, limiting BB dose.    Past Surgical History  Procedure Laterality Date  . Shoulder open rotator cuff repair Bilateral 1980's - 1990's  . Total knee arthroplasty Right 2009  . Cesarean section  [id number]; 1960  . Abdominal hysterectomy    . Appendectomy  1959  . Cholecystectomy  1989  . Joint replacement    . Reduction mammaplasty Bilateral 1990's  . Cataract extraction w/ intraocular lens  implant, bilateral Bilateral 2013  . Coronary angioplasty  12/2013    POBA - OM2  . Percutaneous coronary stent intervention (pci-s)  02/17/14    For restenosis of OM2 - PCI with Xience Apline DES 2.25 mm x 12 mm & 2.25 mm x 8 mm overlapping  . Left heart catheterization with coronary angiogram N/A 01/22/2014    Procedure: LEFT HEART CATHETERIZATION WITH CORONARY ANGIOGRAM;  Surgeon: Sinclair Grooms, MD;  Location: [facility] CATH LAB;  Service: Cardiovascular;  Laterality: N/A;  . Left heart catheterization with coronary  angiogram N/A 02/17/2014    Procedure: LEFT HEART CATHETERIZATION WITH CORONARY ANGIOGRAM;  Surgeon: Burnell Blanks, MD;  Location: [facility] CATH LAB;  Service: Cardiovascular;  Laterality: N/A;  . Left heart catheterization with coronary angiogram N/A 02/24/2014    Procedure: LEFT HEART CATHETERIZATION WITH CORONARY ANGIOGRAM;  Surgeon: Burnell Blanks, MD;  Location: [facility] CATH LAB;  Service: Cardiovascular;  Laterality: N/A;  . Cardiac catheterization  02/24/2014    Procedure: CORONARY BALLOON ANGIOPLASTY;  Surgeon: Burnell Blanks, MD;  Location: [facility] CATH LAB;  Service: Cardiovascular;;    History   Social History  . Marital Status: Widowed    Spouse Name: N/A    Number  of Children: 2  . Years of Education: N/A   Occupational History  . Retired    Social History Main Topics  . Smoking status: Never Smoker   . Smokeless tobacco: Never Used  . Alcohol Use: No  . Drug Use: No  . Sexual Activity: No   Other Topics Concern  . Not on file   Social History Narrative   Lives in [place] by herself.     Current Outpatient Prescriptions on File Prior to Visit  Medication Sig Dispense Refill  . allopurinol (ZYLOPRIM) 300 MG tablet Take 300 mg by mouth daily.    Marland Kitchen aspirin EC 81 MG EC tablet Take 1 tablet (81 mg total) by mouth daily. 30 tablet 3  . Biotin 5000 MCG CAPS Take 1 capsule by mouth daily.    . carvedilol (COREG) 3.125 MG tablet Take 1 tablet (3.125 mg total) by mouth 2 (two) times daily with a meal. 60 tablet 11  . clopidogrel (PLAVIX) 75 MG tablet Take 1 tablet (75 mg total) by mouth daily. 30 tablet 11  . colchicine 0.6 MG tablet Take 0.6 mg by mouth daily.    . famotidine (PEPCID AC) 10 MG tablet Take 1 tablet (10 mg total) by mouth 2 (two) times daily. 60 tablet 6  . furosemide (LASIX) 20 MG tablet Take 20 mg by mouth daily.    . Insulin Syringe-Needle U-100 25G X 1" 1 ML MISC Dispense for taking Levemir and Novolog 1 month supply, if PEN not approved. 30 each 0  . isosorbide mononitrate (IMDUR) 30 MG 24 hr tablet TAKE 1 TABLET BY MOUTH ONCE DAILY 30 tablet 3  . levothyroxine (SYNTHROID, LEVOTHROID) 50 MCG tablet Take 50 mcg by mouth daily.    Marland Kitchen lisinopril (PRINIVIL,ZESTRIL) 2.5 MG tablet Take 1 tablet (2.5 mg total) by mouth daily. 30 tablet 0  . Multiple Vitamin (MULTIVITAMIN) tablet Take 1 tablet by mouth daily.    . nitroGLYCERIN (NITROSTAT) 0.4 MG SL tablet Place 0.4 mg under the tongue every 5 (five) minutes as needed for chest pain.    Marland Kitchen NOVOTWIST 32G X 5 MM MISC     . ONE TOUCH ULTRA TEST test strip     . pantoprazole (PROTONIX) 40 MG tablet Take 40 mg by mouth daily.    Marland Kitchen PROAIR HFA 108 (90 BASE) MCG/ACT inhaler as needed. For  breathing    . rosuvastatin (CRESTOR) 10 MG tablet Take 1 tablet (10 mg total) by mouth every evening. 30 tablet 3   No current facility-administered medications on file prior to visit.    Allergies  Allergen Reactions  . Contrast Media [Iodinated Diagnostic Agents] Other (See Comments)    unknown  . Cortizone-10 [Hydrocortisone] Other (See Comments)    unknown  . Lipitor [Atorvastatin]     Muscle aches-  severe  . Zocor [Simvastatin]     Muscle aches- severe    Family History  Problem Relation Age of Onset  . Hypertension Mother   . Diabetes Mother   . Heart attack Mother 6  . Heart disease Father   . Diabetes Brother   . Heart disease Brother   . Cancer Sister   . Emphysema Father     BP 134/74 mmHg  Pulse 71  Temp(Src) 97.9 F (36.6 C) (Oral)  Ht 5\' 7"  (1.702 m)  Wt 198 lb (89.812 kg)  BMI 31.00 kg/m2  SpO2 98%  Review of Systems Denies LOC and weight change.    Objective:   Physical Exam VITAL SIGNS:  See vs page GENERAL: no distress Pulses: dorsalis pedis intact bilat.   MSK: no deformity of the feet CV: no leg edema Skin:  no ulcer on the feet.  normal color and temp on the feet. Neuro: sensation is intact to touch on the feet.   Lab Results  Component Value Date   HGBA1C 8.3* 07/26/2014   Lab Results  Component Value Date   CHOL 176 07/26/2014   HDL 42.90 07/26/2014   LDLCALC 94 07/26/2014   TRIG 195.0* 07/26/2014   CHOLHDL 4 07/26/2014        Assessment & Plan:  DM: moderate exacerbation Dyslipidemia: well-controlled.   Patient is advised the following: Patient Instructions  check your blood sugar twice a day.  vary the time of day when you check, between before the 3 meals, and at bedtime.  also check if you have symptoms of your blood sugar being too high or too low.  please keep a record of the readings and bring it to your next appointment here.  You can write it on any piece of paper.  please call us sooner if your blood sugar  goes below 70, or if you have a lot of readings over 200. blood tests are being requested for you today.  We'll contact you with results.  Please come back for a follow-up appointment in 3 months.   increase the insulin to 45 units each morning. However, if you are going to be active, take just 25 units i'll see you next time.

## 2014-07-29 DIAGNOSIS — E11339 Type 2 diabetes mellitus with moderate nonproliferative diabetic retinopathy without macular edema: Secondary | ICD-10-CM | POA: Diagnosis not present

## 2014-08-02 DIAGNOSIS — K219 Gastro-esophageal reflux disease without esophagitis: Secondary | ICD-10-CM | POA: Diagnosis not present

## 2014-08-03 DIAGNOSIS — K449 Diaphragmatic hernia without obstruction or gangrene: Secondary | ICD-10-CM | POA: Diagnosis not present

## 2014-08-03 DIAGNOSIS — R1310 Dysphagia, unspecified: Secondary | ICD-10-CM | POA: Diagnosis not present

## 2014-08-03 DIAGNOSIS — E039 Hypothyroidism, unspecified: Secondary | ICD-10-CM | POA: Diagnosis not present

## 2014-08-03 DIAGNOSIS — I10 Essential (primary) hypertension: Secondary | ICD-10-CM | POA: Diagnosis not present

## 2014-08-03 DIAGNOSIS — I519 Heart disease, unspecified: Secondary | ICD-10-CM | POA: Diagnosis not present

## 2014-08-03 DIAGNOSIS — Z9049 Acquired absence of other specified parts of digestive tract: Secondary | ICD-10-CM | POA: Diagnosis not present

## 2014-08-03 DIAGNOSIS — M109 Gout, unspecified: Secondary | ICD-10-CM | POA: Diagnosis not present

## 2014-08-03 DIAGNOSIS — E119 Type 2 diabetes mellitus without complications: Secondary | ICD-10-CM | POA: Diagnosis not present

## 2014-08-03 DIAGNOSIS — Z96651 Presence of right artificial knee joint: Secondary | ICD-10-CM | POA: Diagnosis not present

## 2014-08-03 DIAGNOSIS — J45909 Unspecified asthma, uncomplicated: Secondary | ICD-10-CM | POA: Diagnosis not present

## 2014-08-03 DIAGNOSIS — R142 Eructation: Secondary | ICD-10-CM | POA: Diagnosis not present

## 2014-08-03 DIAGNOSIS — R1013 Epigastric pain: Secondary | ICD-10-CM | POA: Diagnosis not present

## 2014-08-03 DIAGNOSIS — K219 Gastro-esophageal reflux disease without esophagitis: Secondary | ICD-10-CM | POA: Diagnosis not present

## 2014-08-06 ENCOUNTER — Other Ambulatory Visit (HOSPITAL_COMMUNITY): Payer: Self-pay | Admitting: Anesthesiology

## 2014-08-09 ENCOUNTER — Telehealth: Payer: Self-pay | Admitting: Endocrinology

## 2014-08-09 NOTE — Telephone Encounter (Signed)
Patient is calling for lab results.

## 2014-08-09 NOTE — Telephone Encounter (Signed)
Your blood sugar is a little high. Please increase the insulin to 45 units each morning. However, if you are going to be active, take just 25 units i'll see you next time.

## 2014-08-10 NOTE — Telephone Encounter (Signed)
Requested call back to discuss.  

## 2014-08-10 NOTE — Telephone Encounter (Signed)
Pt advised of note below and voiced understanding.  

## 2014-08-13 DIAGNOSIS — K219 Gastro-esophageal reflux disease without esophagitis: Secondary | ICD-10-CM | POA: Diagnosis not present

## 2014-08-13 DIAGNOSIS — K449 Diaphragmatic hernia without obstruction or gangrene: Secondary | ICD-10-CM | POA: Diagnosis not present

## 2014-08-19 DIAGNOSIS — Z7982 Long term (current) use of aspirin: Secondary | ICD-10-CM | POA: Diagnosis not present

## 2014-08-19 DIAGNOSIS — R55 Syncope and collapse: Secondary | ICD-10-CM | POA: Diagnosis not present

## 2014-08-19 DIAGNOSIS — Z7901 Long term (current) use of anticoagulants: Secondary | ICD-10-CM | POA: Diagnosis not present

## 2014-08-19 DIAGNOSIS — J45909 Unspecified asthma, uncomplicated: Secondary | ICD-10-CM | POA: Diagnosis not present

## 2014-08-19 DIAGNOSIS — S0990XA Unspecified injury of head, initial encounter: Secondary | ICD-10-CM | POA: Diagnosis not present

## 2014-08-19 DIAGNOSIS — E039 Hypothyroidism, unspecified: Secondary | ICD-10-CM | POA: Diagnosis not present

## 2014-08-19 DIAGNOSIS — S299XXA Unspecified injury of thorax, initial encounter: Secondary | ICD-10-CM | POA: Diagnosis not present

## 2014-08-19 DIAGNOSIS — R52 Pain, unspecified: Secondary | ICD-10-CM | POA: Diagnosis not present

## 2014-08-19 DIAGNOSIS — I10 Essential (primary) hypertension: Secondary | ICD-10-CM | POA: Diagnosis not present

## 2014-08-19 DIAGNOSIS — S4982XA Other specified injuries of left shoulder and upper arm, initial encounter: Secondary | ICD-10-CM | POA: Diagnosis not present

## 2014-08-19 DIAGNOSIS — S42302A Unspecified fracture of shaft of humerus, left arm, initial encounter for closed fracture: Secondary | ICD-10-CM | POA: Diagnosis not present

## 2014-08-19 DIAGNOSIS — S42215A Unspecified nondisplaced fracture of surgical neck of left humerus, initial encounter for closed fracture: Secondary | ICD-10-CM | POA: Diagnosis not present

## 2014-08-19 DIAGNOSIS — S069X1A Unspecified intracranial injury with loss of consciousness of 30 minutes or less, initial encounter: Secondary | ICD-10-CM | POA: Diagnosis not present

## 2014-08-19 DIAGNOSIS — S42202A Unspecified fracture of upper end of left humerus, initial encounter for closed fracture: Secondary | ICD-10-CM | POA: Diagnosis not present

## 2014-08-19 DIAGNOSIS — N390 Urinary tract infection, site not specified: Secondary | ICD-10-CM | POA: Diagnosis not present

## 2014-08-19 DIAGNOSIS — S42252A Displaced fracture of greater tuberosity of left humerus, initial encounter for closed fracture: Secondary | ICD-10-CM | POA: Diagnosis not present

## 2014-08-21 DIAGNOSIS — S40012A Contusion of left shoulder, initial encounter: Secondary | ICD-10-CM | POA: Diagnosis not present

## 2014-08-21 DIAGNOSIS — M25612 Stiffness of left shoulder, not elsewhere classified: Secondary | ICD-10-CM | POA: Diagnosis not present

## 2014-08-21 DIAGNOSIS — S42212A Unspecified displaced fracture of surgical neck of left humerus, initial encounter for closed fracture: Secondary | ICD-10-CM | POA: Diagnosis not present

## 2014-08-21 DIAGNOSIS — M25512 Pain in left shoulder: Secondary | ICD-10-CM | POA: Diagnosis not present

## 2014-08-23 DIAGNOSIS — S42212D Unspecified displaced fracture of surgical neck of left humerus, subsequent encounter for fracture with routine healing: Secondary | ICD-10-CM | POA: Diagnosis not present

## 2014-08-24 ENCOUNTER — Ambulatory Visit: Payer: Medicare Other | Admitting: Internal Medicine

## 2014-08-31 DIAGNOSIS — S42211D Unspecified displaced fracture of surgical neck of right humerus, subsequent encounter for fracture with routine healing: Secondary | ICD-10-CM | POA: Diagnosis not present

## 2014-08-31 DIAGNOSIS — S42411D Displaced simple supracondylar fracture without intercondylar fracture of right humerus, subsequent encounter for fracture with routine healing: Secondary | ICD-10-CM | POA: Diagnosis not present

## 2014-09-14 DIAGNOSIS — S40012D Contusion of left shoulder, subsequent encounter: Secondary | ICD-10-CM | POA: Diagnosis not present

## 2014-09-14 DIAGNOSIS — S42212D Unspecified displaced fracture of surgical neck of left humerus, subsequent encounter for fracture with routine healing: Secondary | ICD-10-CM | POA: Diagnosis not present

## 2014-09-22 DIAGNOSIS — I5022 Chronic systolic (congestive) heart failure: Secondary | ICD-10-CM | POA: Diagnosis not present

## 2014-09-22 DIAGNOSIS — I214 Non-ST elevation (NSTEMI) myocardial infarction: Secondary | ICD-10-CM | POA: Diagnosis not present

## 2014-09-22 DIAGNOSIS — N183 Chronic kidney disease, stage 3 (moderate): Secondary | ICD-10-CM | POA: Diagnosis not present

## 2014-09-22 DIAGNOSIS — E785 Hyperlipidemia, unspecified: Secondary | ICD-10-CM | POA: Diagnosis not present

## 2014-09-22 DIAGNOSIS — E1129 Type 2 diabetes mellitus with other diabetic kidney complication: Secondary | ICD-10-CM | POA: Diagnosis not present

## 2014-09-27 DIAGNOSIS — S42212D Unspecified displaced fracture of surgical neck of left humerus, subsequent encounter for fracture with routine healing: Secondary | ICD-10-CM | POA: Diagnosis not present

## 2014-09-28 DIAGNOSIS — I82409 Acute embolism and thrombosis of unspecified deep veins of unspecified lower extremity: Secondary | ICD-10-CM | POA: Diagnosis not present

## 2014-09-28 DIAGNOSIS — M79605 Pain in left leg: Secondary | ICD-10-CM | POA: Diagnosis not present

## 2014-09-28 DIAGNOSIS — R2242 Localized swelling, mass and lump, left lower limb: Secondary | ICD-10-CM | POA: Diagnosis not present

## 2014-09-28 DIAGNOSIS — R609 Edema, unspecified: Secondary | ICD-10-CM | POA: Diagnosis not present

## 2014-09-29 ENCOUNTER — Telehealth: Payer: Self-pay | Admitting: Cardiovascular Disease

## 2014-09-29 NOTE — Telephone Encounter (Signed)
New Message     Patient is calling because she was advised by provider that if she has any swelling to call and speak to nurse because that is a sign of HF.  Pt c/o swelling: STAT is pt has developed SOB within 24 hours  1. How long have you been experiencing swelling? Over a month some days worst than others   2. Where is the swelling located?in ankles   3.  Are you currently taking a "fluid pill"?yes   4.  Are you currently SOB? Sometimes when she walks   5.  Have you traveled recently?no

## 2014-09-29 NOTE — Telephone Encounter (Signed)
Spoke with pt. She reports swelling in both ankles and sometimes in legs.  Fell 5 weeks ago and broke left shoulder. Having pain in left leg also since fall.  Saw primary care recently. Ultrasound ordered and done at [place] yesterday. Per pt this was negative for blood clots in legs.  She is seeing Dr. Ronnie Derby next week for leg pain. She reports swelling in legs slightly better when she keeps them elevated but she is having problems keeping them elevated.  She also reports burning in chest when walking to mailbox. Also short of breath with this and sometimes uses inhaler.  This does not happen daily but is new since she last saw Dr. Angelena Form in December. States she had similar burning prior to stent placement.  I scheduled appt for pt to see Dr. Angelena Form tomorrow at 11:30

## 2014-09-30 ENCOUNTER — Ambulatory Visit (INDEPENDENT_AMBULATORY_CARE_PROVIDER_SITE_OTHER): Payer: Medicare Other | Admitting: Cardiovascular Disease

## 2014-09-30 ENCOUNTER — Encounter: Payer: Self-pay | Admitting: Cardiovascular Disease

## 2014-09-30 VITALS — BP 131/70 | HR 70 | Ht 67.0 in | Wt 204.0 lb

## 2014-09-30 DIAGNOSIS — I5022 Chronic systolic (congestive) heart failure: Secondary | ICD-10-CM

## 2014-09-30 DIAGNOSIS — I2510 Atherosclerotic heart disease of native coronary artery without angina pectoris: Secondary | ICD-10-CM

## 2014-09-30 DIAGNOSIS — I10 Essential (primary) hypertension: Secondary | ICD-10-CM

## 2014-09-30 DIAGNOSIS — E785 Hyperlipidemia, unspecified: Secondary | ICD-10-CM | POA: Diagnosis not present

## 2014-09-30 MED ORDER — FUROSEMIDE 40 MG PO TABS: 40.0000 mg | ORAL_TABLET | Freq: Every day | ORAL | Status: DC

## 2014-09-30 MED ORDER — ISOSORBIDE MONONITRATE ER 30 MG PO TB24: 30.0000 mg | ORAL_TABLET | Freq: Two times a day (BID) | ORAL | Status: DC

## 2014-09-30 NOTE — Patient Instructions (Addendum)
Medication Instructions:  Your physician has recommended you make the following change in your medication:  Increase isosorbide mononitrate to 30 mg by mouth twice daily. Increase furosemide to 40 mg by mouth daily   Labwork: None   Testing/Procedures: None  Follow-Up: Your physician wants you to follow-up in:  3-4 months.  You will receive a reminder letter in the mail two months in advance. If you don't receive a letter, please call our office to schedule the follow-up appointment.

## 2014-09-30 NOTE — Progress Notes (Signed)
Chief Complaint  Patient presents with  . Chest Pain     History of Present Illness: 79 yo female with history of CAD, DM, HTN, asthma, hyperthyroidism, HLD who is here today for hospital follow up. I saw her June 2014 as a new patient for evaluation of chest pain. She had a normal stress test and echo in June 2014. She was admitted to [facility] 01/22/14 with NSTEMI. She underwent cardiac cath per Dr. Tamala Julian and he performed balloon angioplasty only of a moderate caliber, tortuous OM2 branch. Severe disease in small PDA was managed medically. LVEF was 25-30% by echo. She was in heart failure and was diuresed. She was readmitted 02/01/14 with weakness but no chest pain. Readmitted 02/15/14 with chest pain.  Troponin was negative but stress myoview with lateral wall defect c/w ischemia. Cardiac cath 02/17/14 and I placed 2 overlapping DES in the second OM branch. I elected to continue to treat the PDA branch medically given small caliber of vessel. She was readmitted 02/24/14 with recurrent chest pain and mildly elevated troponin. Cardiac cath 02/24/14 with patent stents OM2, severe disease PDA. I performed balloon angioplasty on the PDA branch. The vessel was too small for a stent. Also noted that the OM2 branch appears to have a bridging segment with systole just before the stent. LVEF=50-55% by LV gram.   She is here today for follow up. She recently fell off of a treadmill and broke her left arm. Having some chest burning when walking outside but there is also dyspnea and this improves with use of her inhaler. She has not used NTG SL. Episodes last several minutes. Not severe like prior chest pain before stenting. LE edema. Negative venous dopplers this week in primary care.    Primary Care Physician: Reinaldo Meeker  Fasting lipid profile:  Followed in primary care  Past Medical History  Diagnosis Date  . Hypertension   . Asthma   . Hyperlipidemia     a. H/o intolerance to Zocor, Lipitor. Had muscle  aches on higher dose Crestor.  . Gout, unspecified   . CAD S/P percutaneous coronary angioplasty 01/22/2014    A. (12/2013): POBA - OM2 99% (very tortuous segment) - reduced ~ 50% & TIMI 3 flow, 80-90% stenosis in mid PDA, Irregularities <50% in mRCA, pLAD and prox LCx; b. 02/17/14: Moderate sized fixed defect in Lateral wall --> cath with OM2 restenosis & otherwise stable --> Xience Alpine DES x 2 (2.25 mm x 12 & 8 mm). c. NSTEMI 02/2014 s/p PTCA/balloon angioplasty only to small caliber PDA.  Marland Kitchen Chronic systolic heart failure     a. ICM b. ECHO (01/2014): EF 25-30%, grade I DD, trivial MR. c. EF by Myoview 02/17/14 = ~53%. d. EF by cath 02/24/14 - improved to 50-55%.  . NSTEMI (non-ST elevated myocardial infarction) 2014; 12/2013, 01/2014  . Environmental allergies   . Hyperthyroidism   . Type II diabetes mellitus   . GERD (gastroesophageal reflux disease)   . TIA (transient ischemic attack) 1990's    a. TIA vs stroke 1990's "mild" - sounds like a TIA as she was told she had stroke symptoms but negative scans. Denies residual effects.  . Contrast media allergy   . QT prolongation     a. Noted on EKG 02/2014.  Marland Kitchen Anemia     a. Noted on labs 02/2014 possibly procedurally related.  Marland Kitchen LBBB (left bundle branch block)     a. Transient during 02/2014 admission.  . CKD (chronic kidney  disease), stage III   . Sinus bradycardia     a. HR 40s-50s during 02/2014 admission, limiting BB dose.    Past Surgical History  Procedure Laterality Date  . Shoulder open rotator cuff repair Bilateral 1980's - 1990's  . Total knee arthroplasty Right 2009  . Cesarean section  [id number]; 1960  . Abdominal hysterectomy    . Appendectomy  1959  . Cholecystectomy  1989  . Joint replacement    . Reduction mammaplasty Bilateral 1990's  . Cataract extraction w/ intraocular lens  implant, bilateral Bilateral 2013  . Coronary angioplasty  12/2013    POBA - OM2  . Percutaneous coronary stent intervention (pci-s)  02/17/14    For  restenosis of OM2 - PCI with Xience Apline DES 2.25 mm x 12 mm & 2.25 mm x 8 mm overlapping  . Left heart catheterization with coronary angiogram N/A 01/22/2014    Procedure: LEFT HEART CATHETERIZATION WITH CORONARY ANGIOGRAM;  Surgeon: Sinclair Grooms, MD;  Location: [facility] CATH LAB;  Service: Cardiovascular;  Laterality: N/A;  . Left heart catheterization with coronary angiogram N/A 02/17/2014    Procedure: LEFT HEART CATHETERIZATION WITH CORONARY ANGIOGRAM;  Surgeon: Burnell Blanks, MD;  Location: [facility] CATH LAB;  Service: Cardiovascular;  Laterality: N/A;  . Left heart catheterization with coronary angiogram N/A 02/24/2014    Procedure: LEFT HEART CATHETERIZATION WITH CORONARY ANGIOGRAM;  Surgeon: Burnell Blanks, MD;  Location: [facility] CATH LAB;  Service: Cardiovascular;  Laterality: N/A;  . Cardiac catheterization  02/24/2014    Procedure: CORONARY BALLOON ANGIOPLASTY;  Surgeon: Burnell Blanks, MD;  Location: [facility] CATH LAB;  Service: Cardiovascular;;    Current Outpatient Prescriptions  Medication Sig Dispense Refill  . allopurinol (ZYLOPRIM) 300 MG tablet Take 300 mg by mouth daily.    Marland Kitchen aspirin EC 81 MG EC tablet Take 1 tablet (81 mg total) by mouth daily. 30 tablet 3  . Biotin 5000 MCG CAPS Take 1 capsule by mouth daily.    . carvedilol (COREG) 3.125 MG tablet Take 1 tablet (3.125 mg total) by mouth 2 (two) times daily with a meal. 60 tablet 11  . clopidogrel (PLAVIX) 75 MG tablet Take 1 tablet (75 mg total) by mouth daily. 30 tablet 11  . colchicine 0.6 MG tablet Take 0.6 mg by mouth daily.    . famotidine (PEPCID AC) 10 MG tablet Take 1 tablet (10 mg total) by mouth 2 (two) times daily. 60 tablet 6  . furosemide (LASIX) 20 MG tablet Take 20 mg by mouth daily.    . furosemide (LASIX) 20 MG tablet TAKE 1 TABLET BY MOUTH ONCE DAILY 30 tablet 2  . insulin NPH Human (HUMULIN N) 100 UNIT/ML injection Inject 0.35 mLs (35 Units total) into the skin every morning. (Patient taking  differently: Inject 45 Units into the skin every morning. ) 20 mL 11  . Insulin Syringe-Needle U-100 25G X 1" 1 ML MISC Dispense for taking Levemir and Novolog 1 month supply, if PEN not approved. 30 each 0  . isosorbide mononitrate (IMDUR) 30 MG 24 hr tablet TAKE 1 TABLET BY MOUTH ONCE DAILY 30 tablet 3  . levothyroxine (SYNTHROID, LEVOTHROID) 50 MCG tablet Take 50 mcg by mouth daily.    Marland Kitchen lisinopril (PRINIVIL,ZESTRIL) 2.5 MG tablet Take 1 tablet (2.5 mg total) by mouth daily. 30 tablet 0  . Multiple Vitamin (MULTIVITAMIN) tablet Take 1 tablet by mouth daily.    . nitroGLYCERIN (NITROSTAT) 0.4 MG SL tablet Place 0.4 mg under  the tongue every 5 (five) minutes as needed for chest pain.    Marland Kitchen NOVOTWIST 32G X 5 MM MISC     . ONE TOUCH ULTRA TEST test strip     . pantoprazole (PROTONIX) 40 MG tablet Take 40 mg by mouth daily.    Marland Kitchen PROAIR HFA 108 (90 BASE) MCG/ACT inhaler as needed. For breathing    . rosuvastatin (CRESTOR) 10 MG tablet Take 1 tablet (10 mg total) by mouth every evening. 30 tablet 3   No current facility-administered medications for this visit.    Allergies  Allergen Reactions  . Contrast Media [Iodinated Diagnostic Agents] Other (See Comments)    unknown  . Cortizone-10 [Hydrocortisone] Other (See Comments)    unknown  . Lipitor [Atorvastatin]     Muscle aches- severe  . Zocor [Simvastatin]     Muscle aches- severe    History   Social History  . Marital Status: Widowed    Spouse Name: N/A  . Number of Children: 2  . Years of Education: N/A   Occupational History  . Retired    Social History Main Topics  . Smoking status: Never Smoker   . Smokeless tobacco: Never Used  . Alcohol Use: No  . Drug Use: No  . Sexual Activity: No   Other Topics Concern  . Not on file   Social History Narrative   Lives in [place] by herself.     Family History  Problem Relation Age of Onset  . Hypertension Mother   . Diabetes Mother   . Heart attack Mother 84  . Heart  disease Father   . Diabetes Brother   . Heart disease Brother   . Cancer Sister   . Emphysema Father   . Stroke Neg Hx     Review of Systems:  As stated in the HPI and otherwise negative.   BP 131/70 mmHg  Pulse 70  Ht 5\' 7"  (1.702 m)  Wt 204 lb (92.534 kg)  BMI 31.94 kg/m2  Physical Examination: General: Well developed, well nourished, NAD HEENT: OP clear, mucus membranes moist SKIN: warm, dry. No rashes. Neuro: No focal deficits Musculoskeletal: Muscle strength 5/5 all ext Psychiatric: Mood and affect normal Neck: No JVD, no carotid bruits, no thyromegaly, no lymphadenopathy. Lungs:Clear bilaterally, no wheezes, rhonci, crackles Cardiovascular: Regular rate and rhythm. No murmurs, gallops or rubs. Abdomen:Soft. Bowel sounds present. Non-tender.  Extremities: No lower extremity edema. Pulses are 2 + in the bilateral DP/PT.  Cardiac cath 02/24/14:  Left main: 20% mid stenosis.  Left Anterior Descending Artery: Large caliber vessel that courses to the apex. The proximal vessel has a 40-50% stenosis. The mid vessel has a focal 30% stenosis. The diagonal branch is small caliber with diffuse mild plaque.  Circumflex Artery: Moderate caliber vessel with a very small caliber intermediate branch, a very small caliber first OM branch and a moderate calliber second obtuse marginal branch. The proximal Circumflex has a focal 30% stenosis. The intermediate branch is very small in caliber with proximal 50% stenosis. The second obtuse marginal branch has patent stents with no restenosis. There appears to be bridging in the proximal OM2 just before the stent, likely as a result of straightening of the vessel with the stents. The mid AV groove Circumflex has a 40% stenosis beyond the takeoff of the second OM branch.  Right Coronary Artery: Large caliber dominant vessel with small caliber PDA. The proximal vessel has a 30% stenosis. The mid vessel has diffuse 40% stenosis. The  distal vessel tapers to  a small caliber vessel. The PDA is a 1.5 mm vessel. There is a 99% stenosis in the mid PDA.  Left Ventricular Angiogram: LVEF=50-55%.  Impression:  1. Double vessel CAD with patent stents OM2, severe stenosis small caliber PDA  2. Preserved LV systolic function  3. Successful PTCA/balloon angioplasty PDA (no stent placed due to small vessel size)  4. Bridging/kinking of vessel in the tortuous OM2 just before the stented segment  EKG:  EKG is ordered today. The ekg ordered today demonstrates NSR, rate 70 bpm.   Recent Labs: 01/22/2014: Pro B Natriuretic peptide (BNP) 202.7; TSH 0.564 02/01/2014: ALT 18 02/24/2014: Magnesium 2.1 03/04/2014: BUN 29*; Creatinine 1.1; Hemoglobin 10.0*; Platelets 263.0; Potassium 4.4; Sodium 134*   Lipid Panel    Component Value Date/Time   CHOL 176 07/26/2014 1115   TRIG 195.0* 07/26/2014 1115   HDL 42.90 07/26/2014 1115   CHOLHDL 4 07/26/2014 1115   VLDL 39.0 07/26/2014 1115   LDLCALC 94 07/26/2014 1115     Wt Readings from Last 3 Encounters:  09/30/14 204 lb (92.534 kg)  07/26/14 198 lb (89.812 kg)  06/24/14 195 lb 12.8 oz (88.814 kg)     Other studies Reviewed: Additional studies/ records that were reviewed today include: Labs from primary care including lipids Review of the above records demonstrates:    Assessment and Plan:   1. CAD:  Stable mild angina. Will increase Imdur to 30 mg po BID. She will continue ASA and Plavix. Will continue beta blocker, statin, Ace-inh.   2. HTN: BP well controlled. No changes  3. DM with vascular complications (CAD): Followed in primary care.   4. HLD: Continue statin. Lipids well controlled.   5. Chronic systolic CHF: Recent LE edema. Will increase Lasix to 40 mg daily. Elevate feet when sitting. LV function is now normal.   6. GERD: She has severe GERD symptoms. Continue Protonix.    Current medicines are reviewed at length with the patient today.  The patient does not have concerns regarding  medicines.  The following changes have been made:  Increase Imdur and Lasix  Labs/ tests ordered today include:  No orders of the defined types were placed in this encounter.    Disposition:   FU with me in 3 months  Signed, Lauree Chandler, MD 09/30/2014 11:52 AM    [facility] [address], [place], [place]  [address] Phone: [phone]; Fax: [phone]

## 2014-10-01 DIAGNOSIS — S42212D Unspecified displaced fracture of surgical neck of left humerus, subsequent encounter for fracture with routine healing: Secondary | ICD-10-CM | POA: Diagnosis not present

## 2014-10-01 DIAGNOSIS — M25552 Pain in left hip: Secondary | ICD-10-CM | POA: Diagnosis not present

## 2014-10-01 DIAGNOSIS — M25562 Pain in left knee: Secondary | ICD-10-CM | POA: Diagnosis not present

## 2014-10-25 ENCOUNTER — Encounter: Payer: Self-pay | Admitting: Endocrinology

## 2014-10-25 ENCOUNTER — Ambulatory Visit (INDEPENDENT_AMBULATORY_CARE_PROVIDER_SITE_OTHER): Payer: Medicare Other | Admitting: Endocrinology

## 2014-10-25 VITALS — BP 132/74 | HR 68 | Temp 98.6°F | Ht 67.0 in | Wt 208.0 lb

## 2014-10-25 DIAGNOSIS — E1122 Type 2 diabetes mellitus with diabetic chronic kidney disease: Secondary | ICD-10-CM | POA: Diagnosis not present

## 2014-10-25 DIAGNOSIS — N189 Chronic kidney disease, unspecified: Secondary | ICD-10-CM | POA: Diagnosis not present

## 2014-10-25 NOTE — Progress Notes (Signed)
Subjective:    Patient ID: Carol [name], female    DOB: October 10, 1935, 79 y.o.   MRN: [id number]  HPI Pt returns for f/u of diabetes mellitus: DM type: Insulin-requiring type 2 Dx'ed: [id number] Complications: polyneuropathy, renal failure, retinopathy, and CAD. Therapy: insulin since MI in mid-2015.  GDM: never.   DKA: never Severe hypoglycemia: never Pancreatitis: never Other: she had hyperglycemia hyperosmolar state with one of her hospitalizations in 2015; She wants to take insulin only qd; she takes human insulin, due to cost.   Interval history: She has gained weight.  no cbg record, but states cbg's vary from 67-200.  It is in general higher as the day goes on, but is is lower with activity.  Past Medical History  Diagnosis Date  . Hypertension   . Asthma   . Hyperlipidemia     a. H/o intolerance to Zocor, Lipitor. Had muscle aches on higher dose Crestor.  . Gout, unspecified   . CAD S/P percutaneous coronary angioplasty 01/22/2014    A. (12/2013): POBA - OM2 99% (very tortuous segment) - reduced ~ 50% & TIMI 3 flow, 80-90% stenosis in mid PDA, Irregularities <50% in mRCA, pLAD and prox LCx; b. 02/17/14: Moderate sized fixed defect in Lateral wall --> cath with OM2 restenosis & otherwise stable --> Xience Alpine DES x 2 (2.25 mm x 12 & 8 mm). c. NSTEMI 02/2014 s/p PTCA/balloon angioplasty only to small caliber PDA.  Marland Kitchen Chronic systolic heart failure     a. ICM b. ECHO (01/2014): EF 25-30%, grade I DD, trivial MR. c. EF by Myoview 02/17/14 = ~53%. d. EF by cath 02/24/14 - improved to 50-55%.  . NSTEMI (non-ST elevated myocardial infarction) 2014; 12/2013, 01/2014  . Environmental allergies   . Hyperthyroidism   . Type II diabetes mellitus   . GERD (gastroesophageal reflux disease)   . TIA (transient ischemic attack) 1990's    a. TIA vs stroke 1990's "mild" - sounds like a TIA as she was told she had stroke symptoms but negative scans. Denies residual effects.  . Contrast media allergy   .  QT prolongation     a. Noted on EKG 02/2014.  Marland Kitchen Anemia     a. Noted on labs 02/2014 possibly procedurally related.  Marland Kitchen LBBB (left bundle branch block)     a. Transient during 02/2014 admission.  . CKD (chronic kidney disease), stage III   . Sinus bradycardia     a. HR 40s-50s during 02/2014 admission, limiting BB dose.    Past Surgical History  Procedure Laterality Date  . Shoulder open rotator cuff repair Bilateral 1980's - 1990's  . Total knee arthroplasty Right 2009  . Cesarean section  [id number]; 1960  . Abdominal hysterectomy    . Appendectomy  1959  . Cholecystectomy  1989  . Joint replacement    . Reduction mammaplasty Bilateral 1990's  . Cataract extraction w/ intraocular lens  implant, bilateral Bilateral 2013  . Coronary angioplasty  12/2013    POBA - OM2  . Percutaneous coronary stent intervention (pci-s)  02/17/14    For restenosis of OM2 - PCI with Xience Apline DES 2.25 mm x 12 mm & 2.25 mm x 8 mm overlapping  . Left heart catheterization with coronary angiogram N/A 01/22/2014    Procedure: LEFT HEART CATHETERIZATION WITH CORONARY ANGIOGRAM;  Surgeon: Sinclair Grooms, MD;  Location: [facility] CATH LAB;  Service: Cardiovascular;  Laterality: N/A;  . Left heart catheterization with coronary angiogram N/A 02/17/2014  Procedure: LEFT HEART CATHETERIZATION WITH CORONARY ANGIOGRAM;  Surgeon: Burnell Blanks, MD;  Location: [facility] CATH LAB;  Service: Cardiovascular;  Laterality: N/A;  . Left heart catheterization with coronary angiogram N/A 02/24/2014    Procedure: LEFT HEART CATHETERIZATION WITH CORONARY ANGIOGRAM;  Surgeon: Burnell Blanks, MD;  Location: [facility] CATH LAB;  Service: Cardiovascular;  Laterality: N/A;  . Cardiac catheterization  02/24/2014    Procedure: CORONARY BALLOON ANGIOPLASTY;  Surgeon: Burnell Blanks, MD;  Location: [facility] CATH LAB;  Service: Cardiovascular;;    History   Social History  . Marital Status: Widowed    Spouse Name: N/A  . Number of Children: 2   . Years of Education: N/A   Occupational History  . Retired    Social History Main Topics  . Smoking status: Never Smoker   . Smokeless tobacco: Never Used  . Alcohol Use: No  . Drug Use: No  . Sexual Activity: No   Other Topics Concern  . Not on file   Social History Narrative   Lives in [place] by herself.     Current Outpatient Prescriptions on File Prior to Visit  Medication Sig Dispense Refill  . allopurinol (ZYLOPRIM) 300 MG tablet Take 300 mg by mouth daily.    Marland Kitchen aspirin EC 81 MG EC tablet Take 1 tablet (81 mg total) by mouth daily. 30 tablet 3  . Biotin 5000 MCG CAPS Take 1 capsule by mouth daily.    . carvedilol (COREG) 3.125 MG tablet Take 1 tablet (3.125 mg total) by mouth 2 (two) times daily with a meal. 60 tablet 11  . clopidogrel (PLAVIX) 75 MG tablet Take 1 tablet (75 mg total) by mouth daily. 30 tablet 11  . colchicine 0.6 MG tablet Take 0.6 mg by mouth daily.    . famotidine (PEPCID AC) 10 MG tablet Take 1 tablet (10 mg total) by mouth 2 (two) times daily. 60 tablet 6  . furosemide (LASIX) 40 MG tablet Take 1 tablet (40 mg total) by mouth daily. 30 tablet 6  . insulin NPH Human (HUMULIN N) 100 UNIT/ML injection Inject 0.35 mLs (35 Units total) into the skin every morning. 20 mL 11  . Insulin Syringe-Needle U-100 25G X 1" 1 ML MISC Dispense for taking Levemir and Novolog 1 month supply, if PEN not approved. 30 each 0  . isosorbide mononitrate (IMDUR) 30 MG 24 hr tablet Take 1 tablet (30 mg total) by mouth 2 (two) times daily. 60 tablet 6  . levothyroxine (SYNTHROID, LEVOTHROID) 50 MCG tablet Take 50 mcg by mouth daily.    Marland Kitchen lisinopril (PRINIVIL,ZESTRIL) 2.5 MG tablet Take 1 tablet (2.5 mg total) by mouth daily. 30 tablet 0  . Multiple Vitamin (MULTIVITAMIN) tablet Take 1 tablet by mouth daily.    . nitroGLYCERIN (NITROSTAT) 0.4 MG SL tablet Place 0.4 mg under the tongue every 5 (five) minutes as needed for chest pain.    Marland Kitchen NOVOTWIST 32G X 5 MM MISC     . ONE  TOUCH ULTRA TEST test strip     . pantoprazole (PROTONIX) 40 MG tablet Take 40 mg by mouth daily.    Marland Kitchen PROAIR HFA 108 (90 BASE) MCG/ACT inhaler as needed. For breathing    . rosuvastatin (CRESTOR) 10 MG tablet Take 1 tablet (10 mg total) by mouth every evening. 30 tablet 3   No current facility-administered medications on file prior to visit.    Allergies  Allergen Reactions  . Contrast Media [Iodinated Diagnostic Agents] Other (See  Comments)    unknown  . Cortizone-10 [Hydrocortisone] Other (See Comments)    unknown  . Lipitor [Atorvastatin]     Muscle aches- severe  . Zocor [Simvastatin]     Muscle aches- severe    Family History  Problem Relation Age of Onset  . Hypertension Mother   . Diabetes Mother   . Heart attack Mother 38  . Heart disease Father   . Diabetes Brother   . Heart disease Brother   . Cancer Sister   . Emphysema Father   . Stroke Neg Hx     BP 132/74 mmHg  Pulse 68  Temp(Src) 98.6 F (37 C) (Oral)  Ht 5\' 7"  (1.702 m)  Wt 208 lb (94.348 kg)  BMI 32.57 kg/m2  SpO2 96%    Review of Systems Denies LOC and edema.     Objective:   Physical Exam VITAL SIGNS:  See vs page GENERAL: no distress Pulses: dorsalis pedis intact bilat.   MSK: no deformity of the feet CV: 1+ bilat leg edema. Skin:  no ulcer on the feet.  normal color and temp on the feet. Neuro: sensation is intact to touch on the feet.   Ext: There is bilateral onychomycosis of the toenails.     outside test results are reviewed: A1c=7.0%    Assessment & Plan:  DM: this is the best control this pt should aim for, given this regimen, which does match insulin to her changing needs throughout the day Obesity: she is advised to re-lose  Patient is advised the following: Patient Instructions  check your blood sugar twice a day.  vary the time of day when you check, between before the 3 meals, and at bedtime.  also check if you have symptoms of your blood sugar being too high or too  low.  please keep a record of the readings and bring it to your next appointment here.  You can write it on any piece of paper.  please call us sooner if your blood sugar goes below 70, or if you have a lot of readings over 200. Please come back for a follow-up appointment in 3-4 months.  Please continue 35 units each morning.  However, if you are going to be active, take just 30 units.

## 2014-10-25 NOTE — Patient Instructions (Addendum)
check your blood sugar twice a day.  vary the time of day when you check, between before the 3 meals, and at bedtime.  also check if you have symptoms of your blood sugar being too high or too low.  please keep a record of the readings and bring it to your next appointment here.  You can write it on any piece of paper.  please call us sooner if your blood sugar goes below 70, or if you have a lot of readings over 200. Please come back for a follow-up appointment in 3-4 months.  Please continue 35 units each morning.  However, if you are going to be active, take just 30 units.

## 2014-11-17 DIAGNOSIS — T161XXA Foreign body in right ear, initial encounter: Secondary | ICD-10-CM | POA: Diagnosis not present

## 2014-11-23 DIAGNOSIS — I83893 Varicose veins of bilateral lower extremities with other complications: Secondary | ICD-10-CM | POA: Diagnosis not present

## 2014-11-23 DIAGNOSIS — I739 Peripheral vascular disease, unspecified: Secondary | ICD-10-CM | POA: Diagnosis not present

## 2014-11-23 DIAGNOSIS — Z6833 Body mass index (BMI) 33.0-33.9, adult: Secondary | ICD-10-CM | POA: Diagnosis not present

## 2014-11-23 DIAGNOSIS — M179 Osteoarthritis of knee, unspecified: Secondary | ICD-10-CM | POA: Diagnosis not present

## 2014-11-26 ENCOUNTER — Other Ambulatory Visit: Payer: Self-pay

## 2014-11-26 DIAGNOSIS — I83893 Varicose veins of bilateral lower extremities with other complications: Secondary | ICD-10-CM

## 2014-11-26 DIAGNOSIS — I739 Peripheral vascular disease, unspecified: Secondary | ICD-10-CM

## 2014-11-27 DIAGNOSIS — M5442 Lumbago with sciatica, left side: Secondary | ICD-10-CM | POA: Diagnosis not present

## 2014-11-29 ENCOUNTER — Telehealth: Payer: Self-pay | Admitting: Cardiovascular Disease

## 2014-11-29 NOTE — Telephone Encounter (Signed)
New message      Pt is having an MRI.  She has stents.  She was never given a card.  Can she have a MRI?

## 2014-11-29 NOTE — Telephone Encounter (Signed)
Spoke with pt and told her it was OK to have MRI

## 2014-12-10 DIAGNOSIS — M5442 Lumbago with sciatica, left side: Secondary | ICD-10-CM | POA: Diagnosis not present

## 2014-12-10 DIAGNOSIS — G8929 Other chronic pain: Secondary | ICD-10-CM | POA: Diagnosis not present

## 2014-12-14 DIAGNOSIS — M5442 Lumbago with sciatica, left side: Secondary | ICD-10-CM | POA: Diagnosis not present

## 2014-12-17 ENCOUNTER — Other Ambulatory Visit (HOSPITAL_COMMUNITY): Payer: Self-pay | Admitting: *Deleted

## 2014-12-17 NOTE — Progress Notes (Signed)
Ct angio chest 02-24-14 epic nm myocar multi epic 02-17-14 epic 01-23-14 2 d echo epic 09-30-14 ekg epic lov cardiology 09-30-14 epic cardiology clearance note dr Angelena Form on chart for 12-22-14 surgery Dr Henrene Pastor family md  lov note 11-23-14 on chart

## 2014-12-17 NOTE — Patient Instructions (Addendum)
Carol [name]  12/17/2014   Your procedure is scheduled on: Wednesday December 22, 2014  Report to [facility] Main  Entrance take [place]  elevators to 3rd floor to  [place] at 1130 AM.  Call this number if you have problems the morning of surgery [phone]   Remember: ONLY 1 PERSON MAY GO WITH YOU TO SHORT STAY TO GET  READY MORNING OF [place].   Do not eat food:After Midnight, clear liquids midnight until 730 am, nothing by mouth after 730 am day of your surgery.               Take these medicines the morning of surgery with A SIP OF WATER: Allopurinol, Isosorbide Mononitrate (Imdur), Levothyroxine (Synthroid), Carvedilol (Coreg), Pro-Air inhaler if needed             DO NOT TAKE ANY INSULIN MORNING OF SURGERY!   Incentive Spirometer  An incentive spirometer is a tool that can help keep your lungs clear and active. This tool measures how well you are filling your lungs with each breath. Taking long deep breaths may help reverse or decrease the chance of developing breathing (pulmonary) problems (especially infection) following:  A long period of time when you are unable to move or be active. BEFORE THE PROCEDURE   If the spirometer includes an indicator to show your best effort, your nurse or respiratory therapist will set it to a desired goal.  If possible, sit up straight or lean slightly forward. Try not to slouch.  Hold the incentive spirometer in an upright position. INSTRUCTIONS FOR USE  1. Sit on the edge of your bed if possible, or sit up as far as you can in bed or on a chair. 2. Hold the incentive spirometer in an upright position. 3. Breathe out normally. 4. Place the mouthpiece in your mouth and seal your lips tightly around it. 5. Breathe in slowly and as deeply as possible, raising the piston or the ball toward the top of the column. 6. Hold your breath for 3-5 seconds or for as long as possible. Allow the piston or ball to fall  to the bottom of the column. 7. Remove the mouthpiece from your mouth and breathe out normally. 8. Rest for a few seconds and repeat Steps 1 through 7 at least 10 times every 1-2 hours when you are awake. Take your time and take a few normal breaths between deep breaths. 9. The spirometer may include an indicator to show your best effort. Use the indicator as a goal to work toward during each repetition. 10. After each set of 10 deep breaths, practice coughing to be sure your lungs are clear. If you have an incision (the cut made at the time of surgery), support your incision when coughing by placing a pillow or rolled up towels firmly against it. Once you are able to get out of bed, walk around indoors and cough well. You may stop using the incentive spirometer when instructed by your caregiver.  RISKS AND COMPLICATIONS  Take your time so you do not get dizzy or light-headed.  If you are in pain, you may need to take or ask for pain medication before doing incentive spirometry. It is harder to take a deep breath if you are having pain. AFTER USE  Rest and breathe slowly and easily.  It can be helpful to keep track of a log  of your progress. Your caregiver can provide you with a simple table to help with this. If you are using the spirometer at home, follow these instructions: [place] IF:   You are having difficultly using the spirometer.  You have trouble using the spirometer as often as instructed.  Your pain medication is not giving enough relief while using the spirometer.  You develop fever of 100.5 F (38.1 C) or higher. SEEK IMMEDIATE MEDICAL CARE IF:   You cough up bloody sputum that had not been present before.  You develop fever of 102 F (38.9 C) or greater.  You develop worsening pain at or near the incision site. MAKE SURE YOU:   Understand these instructions.  Will watch your condition.  Will get help right away if you are not doing well or get  worse. Document Released: 10/22/2006 Document Revised: 09/03/2011 Document Reviewed: 12/23/2006 ExitCare Patient Information 2014 ExitCare, [place].   ________________________________________________________________________                                Dennis Bast may not have any metal on your body including hair pins and              piercings  Do not wear jewelry, make-up, lotions, powders or perfumes, deodorant             Do not wear nail polish.  Do not shave  48 hours prior to surgery.              Men may shave face and neck.   Do not bring valuables to the hospital. [place].  Contacts, dentures or bridgework may not be worn into surgery.  Leave suitcase in the car. After surgery it may be brought to your room.     _____________________________________________________________________                CLEAR LIQUID DIET   Foods Allowed                                                                     Foods Excluded  Coffee and tea, regular and decaf                             liquids that you cannot  Plain Jell-O in any flavor                                             see through such as: Fruit ices (not with fruit pulp)                                     milk, soups, orange juice  Iced Popsicles  All solid food Carbonated beverages, regular and diet                                    Cranberry, grape and apple juices Sports drinks like Gatorade Lightly seasoned clear broth or consume(fat free) Sugar, honey syrup  Sample Menu Breakfast                                Lunch                                     Supper Cranberry juice                    Beef broth                            Chicken broth Jell-O                                     Grape juice                           Apple juice Coffee or tea                        Jell-O                                      Popsicle                                                 Coffee or tea                        Coffee or tea  _____________________________________________________________________  [facility] - Preparing for Surgery Before surgery, you can play an important role.  Because skin is not sterile, your skin needs to be as free of germs as possible.  You can reduce the number of germs on your skin by washing with CHG (chlorahexidine gluconate) soap before surgery.  CHG is an antiseptic cleaner which kills germs and bonds with the skin to continue killing germs even after washing. Please DO NOT use if you have an allergy to CHG or antibacterial soaps.  If your skin becomes reddened/irritated stop using the CHG and inform your nurse when you arrive at Short Stay. Do not shave (including legs and underarms) for at least 48 hours prior to the first CHG shower.  You may shave your face/neck. Please follow these instructions carefully:  1.  Shower with CHG Soap the night before surgery and the  morning of Surgery.  2.  If you choose to wash your hair, wash your hair first as usual with your  normal  shampoo.  3.  After you shampoo, rinse your hair and body thoroughly to remove the  shampoo.  4.  Use CHG as you would any other liquid soap.  You can apply chg directly  to the skin and wash                       Gently with a scrungie or clean washcloth.  5.  Apply the CHG Soap to your body ONLY FROM THE NECK DOWN.   Do not use on face/ open                           Wound or open sores. Avoid contact with eyes, ears mouth and genitals (private parts).                       Wash face,  Genitals (private parts) with your normal soap.             6.  Wash thoroughly, paying special attention to the area where your surgery  will be performed.  7.  Thoroughly rinse your body with warm water from the neck down.  8.  DO NOT shower/wash with your normal soap after using and rinsing off  the CHG Soap.                 9.  Pat yourself dry with a clean towel.            10.  Wear clean pajamas.            11.  Place clean sheets on your bed the night of your first shower and do not  sleep with pets. Day of Surgery : Do not apply any lotions/deodorants the morning of surgery.  Please wear clean clothes to the hospital/surgery center.  FAILURE TO FOLLOW THESE INSTRUCTIONS MAY RESULT IN THE CANCELLATION OF YOUR SURGERY PATIENT SIGNATURE_________________________________  NURSE SIGNATURE__________________________________  ________________________________________________________________________

## 2014-12-17 NOTE — Progress Notes (Signed)
Please put orders in Epic in surgery 12-22-14 pre op 12-20-14 Thanks

## 2014-12-19 ENCOUNTER — Other Ambulatory Visit: Payer: Self-pay | Admitting: Surgical

## 2014-12-19 NOTE — H&P (Signed)
Carol [name] is an 79 y.o. female.   Chief Complaint: back pain HPI: The patient is a 79 year old female who presents with back pain. The patient reports low back symptoms including pain, low back pain, tightness, stiffness, burning and pain with range of motion which began two months ago without any known injury. Symptoms are reported to be located in the left low back and Symptoms include pain, burning, stiffness and tightness. The pain radiates to the left buttock and left lateral lower leg  Past Medical History  Diagnosis Date  . Hypertension   . Asthma   . Hyperlipidemia     a. H/o intolerance to Zocor, Lipitor. Had muscle aches on higher dose Crestor.  . Gout, unspecified   . CAD S/P percutaneous coronary angioplasty 01/22/2014    A. (12/2013): POBA - OM2 99% (very tortuous segment) - reduced ~ 50% & TIMI 3 flow, 80-90% stenosis in mid PDA, Irregularities <50% in mRCA, pLAD and prox LCx; b. 02/17/14: Moderate sized fixed defect in Lateral wall --> cath with OM2 restenosis & otherwise stable --> Xience Alpine DES x 2 (2.25 mm x 12 & 8 mm). c. NSTEMI 02/2014 s/p PTCA/balloon angioplasty only to small caliber PDA.  Marland Kitchen Chronic systolic heart failure     a. ICM b. ECHO (01/2014): EF 25-30%, grade I DD, trivial MR. c. EF by Myoview 02/17/14 = ~53%. d. EF by cath 02/24/14 - improved to 50-55%.  . NSTEMI (non-ST elevated myocardial infarction) 2014; 12/2013, 01/2014  . Environmental allergies   . Hyperthyroidism   . Type II diabetes mellitus   . GERD (gastroesophageal reflux disease)   . TIA (transient ischemic attack) 1990's    a. TIA vs stroke 1990's "mild" - sounds like a TIA as she was told she had stroke symptoms but negative scans. Denies residual effects.  . Contrast media allergy   . QT prolongation     a. Noted on EKG 02/2014.  Marland Kitchen Anemia     a. Noted on labs 02/2014 possibly procedurally related.  Marland Kitchen LBBB (left bundle branch block)     a. Transient during 02/2014 admission.  . CKD (chronic  kidney disease), stage III   . Sinus bradycardia     a. HR 40s-50s during 02/2014 admission, limiting BB dose.    Past Surgical History  Procedure Laterality Date  . Shoulder open rotator cuff repair Bilateral 1980's - 1990's  . Total knee arthroplasty Right 2009  . Cesarean section  [id number]; 1960  . Abdominal hysterectomy    . Appendectomy  1959  . Cholecystectomy  1989  . Joint replacement    . Reduction mammaplasty Bilateral 1990's  . Cataract extraction w/ intraocular lens  implant, bilateral Bilateral 2013  . Coronary angioplasty  12/2013    POBA - OM2  . Percutaneous coronary stent intervention (pci-s)  02/17/14    For restenosis of OM2 - PCI with Xience Apline DES 2.25 mm x 12 mm & 2.25 mm x 8 mm overlapping  . Left heart catheterization with coronary angiogram N/A 01/22/2014    Procedure: LEFT HEART CATHETERIZATION WITH CORONARY ANGIOGRAM;  Surgeon: Sinclair Grooms, MD;  Location[facility] CATH LAB;  Service: Cardiovascular;  Laterality: N/A;  . Left heart catheterization with coronary angiogram N/A 02/17/2014    Procedure: LEFT HEART CATHETERIZATION WITH CORONARY ANGIOGRAM;  Surgeon: Burnell Blanks, MD;  Location: [facility] CATH LAB;  Service: Cardiovascular;  Laterality: N/A;  . Left heart catheterization with coronary angiogram N/A 02/24/2014  Procedure: LEFT HEART CATHETERIZATION WITH CORONARY ANGIOGRAM;  Surgeon: Burnell Blanks, MD;  Location: [facility] CATH LAB;  Service: Cardiovascular;  Laterality: N/A;  . Cardiac catheterization  02/24/2014    Procedure: CORONARY BALLOON ANGIOPLASTY;  Surgeon: Burnell Blanks, MD;  Location: [facility] CATH LAB;  Service: Cardiovascular;;    Family History  Problem Relation Age of Onset  . Hypertension Mother   . Diabetes Mother   . Heart attack Mother 85  . Heart disease Father   . Diabetes Brother   . Heart disease Brother   . Cancer Sister   . Emphysema Father   . Stroke Neg Hx    Social History:  reports that she has never smoked.  She has never used smokeless tobacco. She reports that she does not drink alcohol or use illicit drugs.  Allergies:  Allergies  Allergen Reactions  . Contrast Media [Iodinated Diagnostic Agents] Other (See Comments)    unknown  . Cortizone-10 [Hydrocortisone] Other (See Comments)    unknown  . Lipitor [Atorvastatin]     Muscle aches- severe  . Zocor [Simvastatin]     Muscle aches- severe     Current outpatient prescriptions:  .  allopurinol (ZYLOPRIM) 300 MG tablet, Take 300 mg by mouth every morning. , Disp: , Rfl:  .  aspirin EC 81 MG EC tablet, Take 1 tablet (81 mg total) by mouth daily., Disp: 30 tablet, Rfl: 3 .  Biotin 5000 MCG CAPS, Take 1 capsule by mouth every morning. , Disp: , Rfl:  .  carvedilol (COREG) 3.125 MG tablet, Take 1 tablet (3.125 mg total) by mouth 2 (two) times daily with a meal., Disp: 60 tablet, Rfl: 11 .  clopidogrel (PLAVIX) 75 MG tablet, Take 1 tablet (75 mg total) by mouth daily. (Patient taking differently: Take 75 mg by mouth every morning. ), Disp: 30 tablet, Rfl: 11 .  colchicine 0.6 MG tablet, Take 0.6 mg by mouth daily as needed (gout flare up.). , Disp: , Rfl:  .  famotidine (PEPCID AC) 10 MG tablet, Take 1 tablet (10 mg total) by mouth 2 (two) times daily. (Patient taking differently: Take 10 mg by mouth 2 (two) times daily as needed for heartburn or indigestion. ), Disp: 60 tablet, Rfl: 6 .  furosemide (LASIX) 20 MG tablet, Take 20 mg by mouth every morning., Disp: , Rfl:  .  insulin NPH Human (HUMULIN N) 100 UNIT/ML injection, Inject 0.35 mLs (35 Units total) into the skin every morning., Disp: 20 mL, Rfl: 11 .  isosorbide mononitrate (IMDUR) 30 MG 24 hr tablet, Take 1 tablet (30 mg total) by mouth 2 (two) times daily., Disp: 60 tablet, Rfl: 6 .  levothyroxine (SYNTHROID, LEVOTHROID) 50 MCG tablet, Take 50 mcg by mouth every morning. , Disp: , Rfl:  .  lisinopril (PRINIVIL,ZESTRIL) 2.5 MG tablet, Take 1 tablet (2.5 mg total) by mouth daily. (Patient  taking differently: Take 2.5 mg by mouth every morning. ), Disp: 30 tablet, Rfl: 0 .  Multiple Vitamin (MULTIVITAMIN) tablet, Take 1 tablet by mouth daily., Disp: , Rfl:  .  nitroGLYCERIN (NITROSTAT) 0.4 MG SL tablet, Place 0.4 mg under the tongue every 5 (five) minutes as needed for chest pain., Disp: , Rfl:  .  pantoprazole (PROTONIX) 40 MG tablet, Take 40 mg by mouth every morning. , Disp: , Rfl:  .  PROAIR HFA 108 (90 BASE) MCG/ACT inhaler, Inhale 1-2 puffs into the lungs every 4 (four) hours as needed for wheezing or shortness of breath.  For breathing, Disp: , Rfl:     Review of Systems  Constitutional: Negative.   HENT: Negative.   Eyes: Negative.   Respiratory: Positive for shortness of breath. Negative for cough, hemoptysis, sputum production and wheezing.        SOB with exertion  Cardiovascular: Negative.   Gastrointestinal: Negative.   Genitourinary: Negative.   Musculoskeletal: Positive for back pain and joint pain. Negative for myalgias, falls and neck pain.  Skin: Negative.   Neurological: Negative.   Endo/Heme/Allergies: Negative.   Psychiatric/Behavioral: Negative.    Weight: 197 lb Height: 67in Body Surface Area: 2.01 m Body Mass Index: 30.85 kg/m  BP: 110/58 (Sitting, Left Arm, Standard) HR: 64 bpm   Physical Exam  Constitutional: She is oriented to person, place, and time. She appears well-developed and well-nourished. No distress.  HENT:  Head: Normocephalic and atraumatic.  Right Ear: External ear normal.  Left Ear: External ear normal.  Nose: Nose normal.  Mouth/Throat: Oropharynx is clear and moist.  Eyes: Conjunctivae and EOM are normal.  Neck: Normal range of motion. Neck supple.  Cardiovascular: Normal rate, regular rhythm, normal heart sounds and intact distal pulses.   No murmur heard. Respiratory: Effort normal and breath sounds normal. No respiratory distress. She has no wheezes.  GI: Soft. Bowel sounds are normal. She exhibits no  distension. There is no tenderness.  Musculoskeletal:       Right hip: Normal.       Left hip: Normal.       Right knee: Normal.       Left knee: Normal.       Lumbar back: She exhibits pain and spasm.  Neurological: She is alert and oriented to person, place, and time. She has normal reflexes. No sensory deficit.  She has weakness of her dorsiflexors and toe extensors of the left foot  Skin: No rash noted. She is not diaphoretic. No erythema.  Psychiatric: She has a normal mood and affect. Her behavior is normal.     Assessment/Plan Lumbar disc herniation L4-L5 left Lumbar spinal stenosis L4-L5, L5-S1 She needs a lumbar laminectomy and microdiscectomy at L4-L5 left as well as central decompression L5-S1. Risks and benefits of the procedure discussed with the patient by Dr. Gladstone Lighter.    H&P performed by Dr. Latanya Maudlin  Susana Duell Ander Purpura 12/19/2014, 6:56 PM

## 2014-12-20 ENCOUNTER — Encounter (HOSPITAL_COMMUNITY): Payer: Self-pay

## 2014-12-20 ENCOUNTER — Encounter (HOSPITAL_COMMUNITY)
Admission: RE | Admit: 2014-12-20 | Discharge: 2014-12-20 | Disposition: A | Discharge status: Home / Self Care | Payer: Medicare Other | Source: Ambulatory Visit | Attending: Orthopedic Surgery | Admitting: Orthopedic Surgery

## 2014-12-20 ENCOUNTER — Ambulatory Visit (HOSPITAL_COMMUNITY)
Admission: RE | Admit: 2014-12-20 | Discharge: 2014-12-20 | Disposition: A | Discharge status: Home / Self Care | Payer: Medicare Other | Source: Ambulatory Visit | Attending: Surgical | Admitting: Surgical

## 2014-12-20 DIAGNOSIS — Z01818 Encounter for other preprocedural examination: Secondary | ICD-10-CM | POA: Insufficient documentation

## 2014-12-20 DIAGNOSIS — M4806 Spinal stenosis, lumbar region: Secondary | ICD-10-CM | POA: Diagnosis not present

## 2014-12-20 DIAGNOSIS — K219 Gastro-esophageal reflux disease without esophagitis: Secondary | ICD-10-CM | POA: Diagnosis not present

## 2014-12-20 DIAGNOSIS — Z9842 Cataract extraction status, left eye: Secondary | ICD-10-CM | POA: Diagnosis not present

## 2014-12-20 DIAGNOSIS — M5136 Other intervertebral disc degeneration, lumbar region: Secondary | ICD-10-CM | POA: Diagnosis not present

## 2014-12-20 DIAGNOSIS — M5126 Other intervertebral disc displacement, lumbar region: Secondary | ICD-10-CM | POA: Diagnosis not present

## 2014-12-20 DIAGNOSIS — Z91041 Radiographic dye allergy status: Secondary | ICD-10-CM | POA: Diagnosis not present

## 2014-12-20 DIAGNOSIS — Z96651 Presence of right artificial knee joint: Secondary | ICD-10-CM | POA: Diagnosis not present

## 2014-12-20 DIAGNOSIS — I2510 Atherosclerotic heart disease of native coronary artery without angina pectoris: Secondary | ICD-10-CM | POA: Diagnosis not present

## 2014-12-20 DIAGNOSIS — M109 Gout, unspecified: Secondary | ICD-10-CM | POA: Diagnosis not present

## 2014-12-20 DIAGNOSIS — M4807 Spinal stenosis, lumbosacral region: Secondary | ICD-10-CM | POA: Diagnosis not present

## 2014-12-20 DIAGNOSIS — E119 Type 2 diabetes mellitus without complications: Secondary | ICD-10-CM | POA: Diagnosis not present

## 2014-12-20 DIAGNOSIS — I129 Hypertensive chronic kidney disease with stage 1 through stage 4 chronic kidney disease, or unspecified chronic kidney disease: Secondary | ICD-10-CM | POA: Diagnosis not present

## 2014-12-20 DIAGNOSIS — Z955 Presence of coronary angioplasty implant and graft: Secondary | ICD-10-CM | POA: Diagnosis not present

## 2014-12-20 DIAGNOSIS — Z888 Allergy status to other drugs, medicaments and biological substances status: Secondary | ICD-10-CM | POA: Diagnosis not present

## 2014-12-20 DIAGNOSIS — E039 Hypothyroidism, unspecified: Secondary | ICD-10-CM | POA: Diagnosis not present

## 2014-12-20 DIAGNOSIS — Z9841 Cataract extraction status, right eye: Secondary | ICD-10-CM | POA: Diagnosis not present

## 2014-12-20 DIAGNOSIS — J45909 Unspecified asthma, uncomplicated: Secondary | ICD-10-CM | POA: Diagnosis not present

## 2014-12-20 DIAGNOSIS — N183 Chronic kidney disease, stage 3 (moderate): Secondary | ICD-10-CM | POA: Diagnosis not present

## 2014-12-20 DIAGNOSIS — I5022 Chronic systolic (congestive) heart failure: Secondary | ICD-10-CM | POA: Diagnosis not present

## 2014-12-20 DIAGNOSIS — Z8673 Personal history of transient ischemic attack (TIA), and cerebral infarction without residual deficits: Secondary | ICD-10-CM | POA: Diagnosis not present

## 2014-12-20 DIAGNOSIS — E785 Hyperlipidemia, unspecified: Secondary | ICD-10-CM | POA: Diagnosis not present

## 2014-12-20 DIAGNOSIS — Z961 Presence of intraocular lens: Secondary | ICD-10-CM | POA: Diagnosis not present

## 2014-12-20 DIAGNOSIS — I252 Old myocardial infarction: Secondary | ICD-10-CM | POA: Diagnosis not present

## 2014-12-20 DIAGNOSIS — Z9049 Acquired absence of other specified parts of digestive tract: Secondary | ICD-10-CM | POA: Diagnosis not present

## 2014-12-20 HISTORY — DX: Hypothyroidism, unspecified: E03.9

## 2014-12-20 LAB — COMPREHENSIVE METABOLIC PANEL
ALT: 13 U/L — ABNORMAL LOW (ref 14–54)
AST: 18 U/L (ref 15–41)
Albumin: 4.3 g/dL (ref 3.5–5.0)
Alkaline Phosphatase: 125 U/L (ref 38–126)
Anion gap: 10 (ref 5–15)
BUN: 44 mg/dL — ABNORMAL HIGH (ref 6–20)
CO2: 28 mmol/L (ref 22–32)
Calcium: 9.6 mg/dL (ref 8.9–10.3)
Chloride: 100 mmol/L — ABNORMAL LOW (ref 101–111)
Creatinine, Ser: 1.19 mg/dL — ABNORMAL HIGH (ref 0.44–1.00)
GFR calc Af Amer: 50 mL/min — ABNORMAL LOW (ref >60)
GFR calc non Af Amer: 43 mL/min — ABNORMAL LOW (ref >60)
Glucose, Bld: 137 mg/dL — ABNORMAL HIGH (ref 65–99)
Potassium: 4.6 mmol/L (ref 3.5–5.1)
Sodium: 138 mmol/L (ref 135–145)
Total Bilirubin: 0.6 mg/dL (ref 0.3–1.2)
Total Protein: 7.7 g/dL (ref 6.5–8.1)

## 2014-12-20 LAB — CBC WITH DIFFERENTIAL/PLATELET
Basophils Absolute: 0 10*3/uL (ref 0.0–0.1)
Basophils Relative: 1 % (ref 0–1)
Eosinophils Absolute: 0.1 10*3/uL (ref 0.0–0.7)
Eosinophils Relative: 1 % (ref 0–5)
HCT: 34.6 % — ABNORMAL LOW (ref 36.0–46.0)
Hemoglobin: 10.9 g/dL — ABNORMAL LOW (ref 12.0–15.0)
Lymphocytes Relative: 27 % (ref 12–46)
Lymphs Abs: 1.8 10*3/uL (ref 0.7–4.0)
MCH: 27.7 pg (ref 26.0–34.0)
MCHC: 31.5 g/dL (ref 30.0–36.0)
MCV: 87.8 fL (ref 78.0–100.0)
Monocytes Absolute: 0.7 10*3/uL (ref 0.1–1.0)
Monocytes Relative: 10 % (ref 3–12)
Neutro Abs: 4.1 10*3/uL (ref 1.7–7.7)
Neutrophils Relative %: 61 % (ref 43–77)
Platelets: 263 10*3/uL (ref 150–400)
RBC: 3.94 MIL/uL (ref 3.87–5.11)
RDW: 15.9 % — ABNORMAL HIGH (ref 11.5–15.5)
WBC: 6.6 10*3/uL (ref 4.0–10.5)

## 2014-12-20 LAB — URINALYSIS, ROUTINE W REFLEX MICROSCOPIC
Bilirubin Urine: NEGATIVE
Glucose, UA: NEGATIVE mg/dL
Ketones, ur: NEGATIVE mg/dL
Nitrite: NEGATIVE
Protein, ur: NEGATIVE mg/dL
Specific Gravity, Urine: 1.007 (ref 1.005–1.030)
Urobilinogen, UA: 0.2 mg/dL (ref 0.0–1.0)
pH: 6 (ref 5.0–8.0)

## 2014-12-20 LAB — PROTIME-INR
INR: 1.02 (ref 0.00–1.49)
Prothrombin Time: 13.6 seconds (ref 11.6–15.2)

## 2014-12-20 LAB — APTT: aPTT: 34 seconds (ref 24–37)

## 2014-12-20 LAB — URINE MICROSCOPIC-ADD ON

## 2014-12-20 LAB — SURGICAL PCR SCREEN
MRSA, PCR: NEGATIVE
STAPHYLOCOCCUS AUREUS: NEGATIVE

## 2014-12-22 ENCOUNTER — Inpatient Hospital Stay (HOSPITAL_COMMUNITY): Payer: Medicare Other

## 2014-12-22 ENCOUNTER — Inpatient Hospital Stay (HOSPITAL_COMMUNITY)
Admission: RE | Admit: 2014-12-22 | Discharge: 2014-12-24 | DRG: 516 | Disposition: A | Discharge status: Skilled Nursing Facility | Payer: Medicare Other | Source: Ambulatory Visit | Attending: Orthopedic Surgery | Admitting: Orthopedic Surgery

## 2014-12-22 ENCOUNTER — Inpatient Hospital Stay (HOSPITAL_COMMUNITY): Payer: Medicare Other | Admitting: Anesthesiology

## 2014-12-22 ENCOUNTER — Encounter (HOSPITAL_COMMUNITY)
Admission: RE | Disposition: A | Discharge status: Skilled Nursing Facility | Payer: Self-pay | Source: Ambulatory Visit | Attending: Orthopedic Surgery

## 2014-12-22 ENCOUNTER — Encounter (HOSPITAL_COMMUNITY): Payer: Self-pay | Admitting: Anesthesiology

## 2014-12-22 DIAGNOSIS — Z961 Presence of intraocular lens: Secondary | ICD-10-CM | POA: Diagnosis present

## 2014-12-22 DIAGNOSIS — Z9049 Acquired absence of other specified parts of digestive tract: Secondary | ICD-10-CM | POA: Diagnosis present

## 2014-12-22 DIAGNOSIS — I5030 Unspecified diastolic (congestive) heart failure: Secondary | ICD-10-CM | POA: Diagnosis not present

## 2014-12-22 DIAGNOSIS — E785 Hyperlipidemia, unspecified: Secondary | ICD-10-CM | POA: Diagnosis not present

## 2014-12-22 DIAGNOSIS — R531 Weakness: Secondary | ICD-10-CM | POA: Diagnosis not present

## 2014-12-22 DIAGNOSIS — Z96651 Presence of right artificial knee joint: Secondary | ICD-10-CM | POA: Diagnosis present

## 2014-12-22 DIAGNOSIS — K219 Gastro-esophageal reflux disease without esophagitis: Secondary | ICD-10-CM | POA: Diagnosis present

## 2014-12-22 DIAGNOSIS — R52 Pain, unspecified: Secondary | ICD-10-CM

## 2014-12-22 DIAGNOSIS — J45909 Unspecified asthma, uncomplicated: Secondary | ICD-10-CM | POA: Diagnosis not present

## 2014-12-22 DIAGNOSIS — M48062 Spinal stenosis, lumbar region with neurogenic claudication: Secondary | ICD-10-CM | POA: Diagnosis present

## 2014-12-22 DIAGNOSIS — Z91041 Radiographic dye allergy status: Secondary | ICD-10-CM | POA: Diagnosis not present

## 2014-12-22 DIAGNOSIS — I2510 Atherosclerotic heart disease of native coronary artery without angina pectoris: Secondary | ICD-10-CM | POA: Diagnosis present

## 2014-12-22 DIAGNOSIS — M4806 Spinal stenosis, lumbar region: Secondary | ICD-10-CM | POA: Diagnosis not present

## 2014-12-22 DIAGNOSIS — Z4889 Encounter for other specified surgical aftercare: Secondary | ICD-10-CM | POA: Diagnosis not present

## 2014-12-22 DIAGNOSIS — M109 Gout, unspecified: Secondary | ICD-10-CM | POA: Diagnosis present

## 2014-12-22 DIAGNOSIS — Z8673 Personal history of transient ischemic attack (TIA), and cerebral infarction without residual deficits: Secondary | ICD-10-CM

## 2014-12-22 DIAGNOSIS — Z888 Allergy status to other drugs, medicaments and biological substances status: Secondary | ICD-10-CM | POA: Diagnosis not present

## 2014-12-22 DIAGNOSIS — M5126 Other intervertebral disc displacement, lumbar region: Secondary | ICD-10-CM | POA: Diagnosis present

## 2014-12-22 DIAGNOSIS — E119 Type 2 diabetes mellitus without complications: Secondary | ICD-10-CM | POA: Diagnosis present

## 2014-12-22 DIAGNOSIS — Z9889 Other specified postprocedural states: Secondary | ICD-10-CM | POA: Diagnosis not present

## 2014-12-22 DIAGNOSIS — Z01818 Encounter for other preprocedural examination: Secondary | ICD-10-CM

## 2014-12-22 DIAGNOSIS — I5022 Chronic systolic (congestive) heart failure: Secondary | ICD-10-CM | POA: Diagnosis not present

## 2014-12-22 DIAGNOSIS — E039 Hypothyroidism, unspecified: Secondary | ICD-10-CM | POA: Diagnosis present

## 2014-12-22 DIAGNOSIS — M1A9XX0 Chronic gout, unspecified, without tophus (tophi): Secondary | ICD-10-CM | POA: Diagnosis not present

## 2014-12-22 DIAGNOSIS — Z9842 Cataract extraction status, left eye: Secondary | ICD-10-CM

## 2014-12-22 DIAGNOSIS — I10 Essential (primary) hypertension: Secondary | ICD-10-CM | POA: Diagnosis not present

## 2014-12-22 DIAGNOSIS — N183 Chronic kidney disease, stage 3 (moderate): Secondary | ICD-10-CM | POA: Diagnosis not present

## 2014-12-22 DIAGNOSIS — I129 Hypertensive chronic kidney disease with stage 1 through stage 4 chronic kidney disease, or unspecified chronic kidney disease: Secondary | ICD-10-CM | POA: Diagnosis present

## 2014-12-22 DIAGNOSIS — M4807 Spinal stenosis, lumbosacral region: Secondary | ICD-10-CM | POA: Diagnosis present

## 2014-12-22 DIAGNOSIS — Z955 Presence of coronary angioplasty implant and graft: Secondary | ICD-10-CM | POA: Diagnosis not present

## 2014-12-22 DIAGNOSIS — I252 Old myocardial infarction: Secondary | ICD-10-CM

## 2014-12-22 DIAGNOSIS — Z9841 Cataract extraction status, right eye: Secondary | ICD-10-CM | POA: Diagnosis not present

## 2014-12-22 DIAGNOSIS — M545 Low back pain: Secondary | ICD-10-CM | POA: Diagnosis present

## 2014-12-22 HISTORY — PX: LUMBAR LAMINECTOMY/DECOMPRESSION MICRODISCECTOMY: SHX5026

## 2014-12-22 LAB — GLUCOSE, CAPILLARY
GLUCOSE-CAPILLARY: 103 mg/dL — AB (ref 65–99)
GLUCOSE-CAPILLARY: 87 mg/dL (ref 65–99)
Glucose-Capillary: 179 mg/dL — ABNORMAL HIGH (ref 65–99)

## 2014-12-22 SURGERY — LUMBAR LAMINECTOMY/DECOMPRESSION MICRODISCECTOMY 2 LEVELS
Anesthesia: General | Site: Back | Laterality: Left

## 2014-12-22 MED ORDER — BACITRACIN-NEOMYCIN-POLYMYXIN 400-5-5000 EX OINT
TOPICAL_OINTMENT | CUTANEOUS | Status: DC | PRN
  Administered 2014-12-22: 1 via TOPICAL

## 2014-12-22 MED ORDER — NEOSTIGMINE METHYLSULFATE 10 MG/10ML IV SOLN
INTRAVENOUS | Status: DC | PRN
  Administered 2014-12-22: 4 mg via INTRAVENOUS

## 2014-12-22 MED ORDER — THROMBIN 5000 UNITS EX SOLR
CUTANEOUS | Status: DC | PRN
  Administered 2014-12-22: 10000 [IU] via TOPICAL

## 2014-12-22 MED ORDER — METHOCARBAMOL 1000 MG/10ML IJ SOLN
500.0000 mg | Freq: Four times a day (QID) | INTRAMUSCULAR | Status: DC | PRN
  Administered 2014-12-23: 500 mg via INTRAVENOUS
  Filled 2014-12-22 (×2): qty 5

## 2014-12-22 MED ORDER — MIDAZOLAM HCL 2 MG/2ML IJ SOLN
INTRAMUSCULAR | Status: AC
  Filled 2014-12-22: qty 2

## 2014-12-22 MED ORDER — LISINOPRIL 2.5 MG PO TABS
2.5000 mg | ORAL_TABLET | Freq: Every day | ORAL | Status: DC
  Administered 2014-12-22 – 2014-12-24 (×2): 2.5 mg via ORAL
  Filled 2014-12-22 (×3): qty 1

## 2014-12-22 MED ORDER — BIOTIN 5000 MCG PO CAPS: 1.0000 | ORAL_CAPSULE | Freq: Every morning | ORAL | Status: DC

## 2014-12-22 MED ORDER — FENTANYL CITRATE (PF) 250 MCG/5ML IJ SOLN
INTRAMUSCULAR | Status: AC
  Filled 2014-12-22: qty 5

## 2014-12-22 MED ORDER — CEFAZOLIN SODIUM-DEXTROSE 2-3 GM-% IV SOLR
2.0000 g | INTRAVENOUS | Status: AC
  Administered 2014-12-22: 2 g via INTRAVENOUS

## 2014-12-22 MED ORDER — FAMOTIDINE 10 MG PO TABS
10.0000 mg | ORAL_TABLET | Freq: Two times a day (BID) | ORAL | Status: DC
  Administered 2014-12-22 – 2014-12-24 (×3): 10 mg via ORAL
  Filled 2014-12-22 (×5): qty 1

## 2014-12-22 MED ORDER — FENTANYL CITRATE (PF) 100 MCG/2ML IJ SOLN
INTRAMUSCULAR | Status: DC | PRN
  Administered 2014-12-22: 25 ug via INTRAVENOUS
  Administered 2014-12-22 (×4): 50 ug via INTRAVENOUS
  Administered 2014-12-22: 25 ug via INTRAVENOUS

## 2014-12-22 MED ORDER — ACETAMINOPHEN 650 MG RE SUPP: 650.0000 mg | RECTAL | Status: DC | PRN

## 2014-12-22 MED ORDER — LACTATED RINGERS IV SOLN
INTRAVENOUS | Status: DC
  Administered 2014-12-22: 13:00:00 via INTRAVENOUS

## 2014-12-22 MED ORDER — FUROSEMIDE 40 MG PO TABS: 40.0000 mg | ORAL_TABLET | Freq: Every day | ORAL | Status: DC

## 2014-12-22 MED ORDER — CEFAZOLIN SODIUM-DEXTROSE 2-3 GM-% IV SOLR
INTRAVENOUS | Status: AC
  Filled 2014-12-22: qty 50

## 2014-12-22 MED ORDER — BISACODYL 5 MG PO TBEC: 5.0000 mg | DELAYED_RELEASE_TABLET | Freq: Every day | ORAL | Status: DC | PRN

## 2014-12-22 MED ORDER — LIDOCAINE HCL (CARDIAC) 20 MG/ML IV SOLN
INTRAVENOUS | Status: DC | PRN
  Administered 2014-12-22: 40 mg via INTRAVENOUS

## 2014-12-22 MED ORDER — COLCHICINE 0.6 MG PO TABS
0.6000 mg | ORAL_TABLET | Freq: Every day | ORAL | Status: DC | PRN
  Filled 2014-12-22: qty 1

## 2014-12-22 MED ORDER — EPHEDRINE SULFATE 50 MG/ML IJ SOLN
INTRAMUSCULAR | Status: DC | PRN
  Administered 2014-12-22: 5 mg via INTRAVENOUS

## 2014-12-22 MED ORDER — METHOCARBAMOL 500 MG PO TABS
500.0000 mg | ORAL_TABLET | Freq: Four times a day (QID) | ORAL | Status: DC | PRN
  Administered 2014-12-23: 500 mg via ORAL
  Filled 2014-12-22: qty 1

## 2014-12-22 MED ORDER — MENTHOL 3 MG MT LOZG: 1.0000 | LOZENGE | OROMUCOSAL | Status: DC | PRN

## 2014-12-22 MED ORDER — NEOSTIGMINE METHYLSULFATE 10 MG/10ML IV SOLN
INTRAVENOUS | Status: AC
  Filled 2014-12-22: qty 1

## 2014-12-22 MED ORDER — MIDAZOLAM HCL 5 MG/5ML IJ SOLN
INTRAMUSCULAR | Status: DC | PRN
  Administered 2014-12-22 (×2): 0.5 mg via INTRAVENOUS

## 2014-12-22 MED ORDER — MEPERIDINE HCL 50 MG/ML IJ SOLN: 6.2500 mg | INTRAMUSCULAR | Status: DC | PRN

## 2014-12-22 MED ORDER — PROPOFOL 10 MG/ML IV BOLUS
INTRAVENOUS | Status: DC | PRN
  Administered 2014-12-22: 100 mg via INTRAVENOUS

## 2014-12-22 MED ORDER — BUPIVACAINE-EPINEPHRINE (PF) 0.5% -1:200000 IJ SOLN
INTRAMUSCULAR | Status: AC
  Filled 2014-12-22: qty 30

## 2014-12-22 MED ORDER — ALBUTEROL SULFATE (2.5 MG/3ML) 0.083% IN NEBU: 2.5000 mg | INHALATION_SOLUTION | RESPIRATORY_TRACT | Status: DC | PRN

## 2014-12-22 MED ORDER — PANTOPRAZOLE SODIUM 40 MG PO TBEC
40.0000 mg | DELAYED_RELEASE_TABLET | Freq: Every morning | ORAL | Status: DC
  Administered 2014-12-24: 40 mg via ORAL
  Filled 2014-12-22 (×2): qty 1

## 2014-12-22 MED ORDER — FLEET ENEMA 7-19 GM/118ML RE ENEM: 1.0000 | ENEMA | Freq: Once | RECTAL | Status: AC | PRN

## 2014-12-22 MED ORDER — ONDANSETRON HCL 4 MG/2ML IJ SOLN
INTRAMUSCULAR | Status: AC
  Filled 2014-12-22: qty 2

## 2014-12-22 MED ORDER — ALLOPURINOL 300 MG PO TABS
300.0000 mg | ORAL_TABLET | Freq: Every morning | ORAL | Status: DC
  Administered 2014-12-24: 300 mg via ORAL
  Filled 2014-12-22 (×2): qty 1

## 2014-12-22 MED ORDER — PHENOL 1.4 % MT LIQD: 1.0000 | OROMUCOSAL | Status: DC | PRN

## 2014-12-22 MED ORDER — BACITRACIN-NEOMYCIN-POLYMYXIN 400-5-5000 EX OINT
TOPICAL_OINTMENT | CUTANEOUS | Status: AC
  Filled 2014-12-22: qty 1

## 2014-12-22 MED ORDER — ISOSORBIDE MONONITRATE ER 30 MG PO TB24
30.0000 mg | ORAL_TABLET | Freq: Two times a day (BID) | ORAL | Status: DC
  Administered 2014-12-22 – 2014-12-24 (×3): 30 mg via ORAL
  Filled 2014-12-22 (×5): qty 1

## 2014-12-22 MED ORDER — PHENYLEPHRINE HCL 10 MG/ML IJ SOLN
INTRAMUSCULAR | Status: DC | PRN
  Administered 2014-12-22 (×3): 40 ug via INTRAVENOUS

## 2014-12-22 MED ORDER — CARVEDILOL 3.125 MG PO TABS
3.1250 mg | ORAL_TABLET | Freq: Two times a day (BID) | ORAL | Status: DC
  Administered 2014-12-22 – 2014-12-24 (×4): 3.125 mg via ORAL
  Filled 2014-12-22 (×6): qty 1

## 2014-12-22 MED ORDER — PROPOFOL 10 MG/ML IV BOLUS
INTRAVENOUS | Status: AC
  Filled 2014-12-22: qty 20

## 2014-12-22 MED ORDER — FUROSEMIDE 20 MG PO TABS
20.0000 mg | ORAL_TABLET | Freq: Every morning | ORAL | Status: DC
  Administered 2014-12-24: 20 mg via ORAL
  Filled 2014-12-22 (×2): qty 1

## 2014-12-22 MED ORDER — SODIUM CHLORIDE 0.9 % IR SOLN
Status: AC
  Filled 2014-12-22: qty 1

## 2014-12-22 MED ORDER — NITROGLYCERIN 0.4 MG SL SUBL: 0.4000 mg | SUBLINGUAL_TABLET | SUBLINGUAL | Status: DC | PRN

## 2014-12-22 MED ORDER — LACTATED RINGERS IV SOLN
INTRAVENOUS | Status: DC
  Administered 2014-12-23: via INTRAVENOUS

## 2014-12-22 MED ORDER — BUPIVACAINE-EPINEPHRINE (PF) 0.5% -1:200000 IJ SOLN
INTRAMUSCULAR | Status: DC | PRN
  Administered 2014-12-22: 20 mL

## 2014-12-22 MED ORDER — POLYETHYLENE GLYCOL 3350 17 G PO PACK: 17.0000 g | PACK | Freq: Every day | ORAL | Status: DC | PRN

## 2014-12-22 MED ORDER — LEVOTHYROXINE SODIUM 50 MCG PO TABS
50.0000 ug | ORAL_TABLET | Freq: Every day | ORAL | Status: DC
  Administered 2014-12-23 – 2014-12-24 (×2): 50 ug via ORAL
  Filled 2014-12-22 (×3): qty 1

## 2014-12-22 MED ORDER — SODIUM CHLORIDE 0.9 % IR SOLN
Status: DC | PRN
  Administered 2014-12-22: 500 mL

## 2014-12-22 MED ORDER — ONDANSETRON HCL 4 MG/2ML IJ SOLN
4.0000 mg | INTRAMUSCULAR | Status: DC | PRN
  Administered 2014-12-23: 4 mg via INTRAVENOUS
  Filled 2014-12-22: qty 2

## 2014-12-22 MED ORDER — CEFAZOLIN SODIUM 1-5 GM-% IV SOLN
1.0000 g | Freq: Three times a day (TID) | INTRAVENOUS | Status: AC
  Administered 2014-12-22 – 2014-12-23 (×3): 1 g via INTRAVENOUS
  Filled 2014-12-22 (×3): qty 50

## 2014-12-22 MED ORDER — FENTANYL CITRATE (PF) 100 MCG/2ML IJ SOLN
25.0000 ug | INTRAMUSCULAR | Status: DC | PRN
  Administered 2014-12-22 (×2): 50 ug via INTRAVENOUS

## 2014-12-22 MED ORDER — GLYCOPYRROLATE 0.2 MG/ML IJ SOLN
INTRAMUSCULAR | Status: AC
  Filled 2014-12-22: qty 3

## 2014-12-22 MED ORDER — THROMBIN 5000 UNITS EX SOLR
CUTANEOUS | Status: AC
  Filled 2014-12-22: qty 10000

## 2014-12-22 MED ORDER — HYDROMORPHONE HCL 1 MG/ML IJ SOLN
0.5000 mg | INTRAMUSCULAR | Status: DC | PRN
  Administered 2014-12-22 – 2014-12-23 (×2): 1 mg via INTRAVENOUS
  Filled 2014-12-22 (×2): qty 1

## 2014-12-22 MED ORDER — FENTANYL CITRATE (PF) 100 MCG/2ML IJ SOLN
INTRAMUSCULAR | Status: AC
  Filled 2014-12-22: qty 2

## 2014-12-22 MED ORDER — PROMETHAZINE HCL 25 MG/ML IJ SOLN: 6.2500 mg | INTRAMUSCULAR | Status: DC | PRN

## 2014-12-22 MED ORDER — OXYCODONE-ACETAMINOPHEN 5-325 MG PO TABS
1.0000 | ORAL_TABLET | ORAL | Status: DC | PRN
  Administered 2014-12-24: 2 via ORAL
  Filled 2014-12-22: qty 2

## 2014-12-22 MED ORDER — INSULIN ASPART 100 UNIT/ML ~~LOC~~ SOLN
0.0000 [IU] | Freq: Three times a day (TID) | SUBCUTANEOUS | Status: DC
  Administered 2014-12-23: 8 [IU] via SUBCUTANEOUS
  Administered 2014-12-23 – 2014-12-24 (×2): 3 [IU] via SUBCUTANEOUS

## 2014-12-22 MED ORDER — ROCURONIUM BROMIDE 100 MG/10ML IV SOLN
INTRAVENOUS | Status: DC | PRN
  Administered 2014-12-22: 40 mg via INTRAVENOUS
  Administered 2014-12-22: 10 mg via INTRAVENOUS

## 2014-12-22 MED ORDER — BUPIVACAINE LIPOSOME 1.3 % IJ SUSP
20.0000 mL | Freq: Once | INTRAMUSCULAR | Status: AC
  Administered 2014-12-22: 20 mL
  Filled 2014-12-22: qty 20

## 2014-12-22 MED ORDER — HYDROCODONE-ACETAMINOPHEN 5-325 MG PO TABS
1.0000 | ORAL_TABLET | ORAL | Status: DC | PRN
  Administered 2014-12-22: 1 via ORAL
  Administered 2014-12-23: 2 via ORAL
  Administered 2014-12-23: 1 via ORAL
  Administered 2014-12-23: 2 via ORAL
  Filled 2014-12-22: qty 1
  Filled 2014-12-22: qty 2
  Filled 2014-12-22: qty 1
  Filled 2014-12-22: qty 2

## 2014-12-22 MED ORDER — ACETAMINOPHEN 325 MG PO TABS: 650.0000 mg | ORAL_TABLET | ORAL | Status: DC | PRN

## 2014-12-22 MED ORDER — GLYCOPYRROLATE 0.2 MG/ML IJ SOLN
INTRAMUSCULAR | Status: DC | PRN
  Administered 2014-12-22: .6 mg via INTRAVENOUS
  Administered 2014-12-22: 0.1 mg via INTRAVENOUS

## 2014-12-22 MED ORDER — ALBUTEROL SULFATE HFA 108 (90 BASE) MCG/ACT IN AERS: 1.0000 | INHALATION_SPRAY | RESPIRATORY_TRACT | Status: DC | PRN

## 2014-12-22 MED ORDER — ONDANSETRON HCL 4 MG/2ML IJ SOLN
INTRAMUSCULAR | Status: DC | PRN
  Administered 2014-12-22: 4 mg via INTRAVENOUS

## 2014-12-22 MED ORDER — ROCURONIUM BROMIDE 100 MG/10ML IV SOLN
INTRAVENOUS | Status: AC
  Filled 2014-12-22: qty 1

## 2014-12-22 MED ORDER — LIDOCAINE HCL (CARDIAC) 20 MG/ML IV SOLN
INTRAVENOUS | Status: AC
  Filled 2014-12-22: qty 5

## 2014-12-22 SURGICAL SUPPLY — 46 items
BAG ZIPLOCK 12X15 (MISCELLANEOUS) ×3 IMPLANT
BENZOIN TINCTURE PRP APPL 2/3 (GAUZE/BANDAGES/DRESSINGS) ×3 IMPLANT
CLEANER TIP ELECTROSURG 2X2 (MISCELLANEOUS) ×3 IMPLANT
DRAIN PENROSE 18X1/4 LTX STRL (WOUND CARE) IMPLANT
DRAPE MICROSCOPE LEICA (MISCELLANEOUS) ×3 IMPLANT
DRAPE POUCH INSTRU U-SHP 10X18 (DRAPES) ×3 IMPLANT
DRAPE SHEET LG 3/4 BI-LAMINATE (DRAPES) ×3 IMPLANT
DRAPE SURG 17X11 SM STRL (DRAPES) ×3 IMPLANT
DRSG ADAPTIC 3X8 NADH LF (GAUZE/BANDAGES/DRESSINGS) ×3 IMPLANT
DRSG PAD ABDOMINAL 8X10 ST (GAUZE/BANDAGES/DRESSINGS) IMPLANT
DURAPREP 26ML APPLICATOR (WOUND CARE) ×3 IMPLANT
ELECT REM PT RETURN 9FT ADLT (ELECTROSURGICAL) ×3
ELECTRODE REM PT RTRN 9FT ADLT (ELECTROSURGICAL) ×1 IMPLANT
GAUZE SPONGE 4X4 12PLY STRL (GAUZE/BANDAGES/DRESSINGS) ×3 IMPLANT
GLOVE BIOGEL PI IND STRL 8 (GLOVE) ×1 IMPLANT
GLOVE BIOGEL PI INDICATOR 8 (GLOVE) ×2
GLOVE ECLIPSE 8.0 STRL XLNG CF (GLOVE) ×9 IMPLANT
GLOVE INDICATOR 8.0 STRL GRN (GLOVE) ×3 IMPLANT
GOWN STRL REUS W/TWL XL LVL3 (GOWN DISPOSABLE) ×6 IMPLANT
KIT BASIN OR (CUSTOM PROCEDURE TRAY) ×3 IMPLANT
KIT POSITIONING SURG ANDREWS (MISCELLANEOUS) ×3 IMPLANT
MANIFOLD NEPTUNE II (INSTRUMENTS) ×3 IMPLANT
NEEDLE HYPO 22GX1.5 SAFETY (NEEDLE) ×6 IMPLANT
NEEDLE SPNL 18GX3.5 QUINCKE PK (NEEDLE) ×6 IMPLANT
NS IRRIG 1000ML POUR BTL (IV SOLUTION) IMPLANT
PACK LAMINECTOMY ORTHO (CUSTOM PROCEDURE TRAY) ×3 IMPLANT
PAD ABD 8X10 STRL (GAUZE/BANDAGES/DRESSINGS) ×3 IMPLANT
PATTIES SURGICAL .5 X.5 (GAUZE/BANDAGES/DRESSINGS) IMPLANT
PATTIES SURGICAL .75X.75 (GAUZE/BANDAGES/DRESSINGS) IMPLANT
PATTIES SURGICAL 1X1 (DISPOSABLE) IMPLANT
PEN SKIN MARKING BROAD (MISCELLANEOUS) ×3 IMPLANT
PIN SAFETY NICK PLATE  2 MED (MISCELLANEOUS)
PIN SAFETY NICK PLATE 2 MED (MISCELLANEOUS) IMPLANT
POSITIONER SURGICAL ARM (MISCELLANEOUS) ×3 IMPLANT
SPONGE LAP 4X18 X RAY DECT (DISPOSABLE) ×6 IMPLANT
SPONGE SURGIFOAM ABS GEL 100 (HEMOSTASIS) ×3 IMPLANT
STAPLER VISISTAT 35W (STAPLE) IMPLANT
SUT VIC AB 0 CT1 27 (SUTURE)
SUT VIC AB 0 CT1 27XBRD ANTBC (SUTURE) IMPLANT
SUT VIC AB 1 CT1 27 (SUTURE) ×8
SUT VIC AB 1 CT1 27XBRD ANTBC (SUTURE) ×4 IMPLANT
SYR 20CC LL (SYRINGE) ×6 IMPLANT
TAPE CLOTH SURG 6X10 WHT LF (GAUZE/BANDAGES/DRESSINGS) ×3 IMPLANT
TOWEL OR 17X26 10 PK STRL BLUE (TOWEL DISPOSABLE) ×3 IMPLANT
TOWEL OR NON WOVEN STRL DISP B (DISPOSABLE) IMPLANT
TRAY FOLEY W/METER SILVER 14FR (SET/KITS/TRAYS/PACK) ×3 IMPLANT

## 2014-12-22 NOTE — Interval H&P Note (Signed)
History and Physical Interval Note:  12/22/2014 12:39 PM  Carol [name]  has presented today for surgery, with the diagnosis of HERNIATED DISC   The various methods of treatment have been discussed with the patient and family. After consideration of risks, benefits and other options for treatment, the patient has consented to  Procedure(s): CENTRAL DECOMPRESSION L5-S1,  MICRODISCECTOMY L4-L5 L5-S1,  LAMINECTOMY L4 ON LEFT   (2 LEVELS) (Left) as a surgical intervention .  The patient's history has been reviewed, patient examined, no change in status, stable for surgery.  I have reviewed the patient's chart and labs.  Questions were answered to the patient's satisfaction.     [name]

## 2014-12-22 NOTE — Anesthesia Procedure Notes (Signed)
Procedure Name: Intubation Date/Time: 12/22/2014 1:19 PM Performed by: Sherian Maroon A Pre-anesthesia Checklist: Patient identified, Timeout performed, Emergency Drugs available, Suction available and Patient being monitored Patient Re-evaluated:Patient Re-evaluated prior to inductionOxygen Delivery Method: Circle system utilized Preoxygenation: Pre-oxygenation with 100% oxygen Intubation Type: IV induction Ventilation: Mask ventilation without difficulty Laryngoscope Size: Mac and 3 Grade View: Grade I Tube size: 7.5 mm Number of attempts: 1 Airway Equipment and Method: Stylet Placement Confirmation: ETT inserted through vocal cords under direct vision,  breath sounds checked- equal and bilateral and positive ETCO2 Secured at: 21 cm Tube secured with: Tape Dental Injury: Teeth and Oropharynx as per pre-operative assessment

## 2014-12-22 NOTE — Transfer of Care (Signed)
Immediate Anesthesia Transfer of Care Note  Patient: Carol [name]  Procedure(s) Performed: Procedure(s): CENTRAL DECOMPRESSIVE LUMBAR, AND LAMINECTOMY L5-S1,  S1-S2 FOR SPINAL STENOSIS FORAMINOTOMY L5, S1, S2 ROOTS ON LEFT FOR FORAMINAL STENOSIS, AND DECOMPRESSION L5-S1, S1-S2 ACCORDING TO LABELING OF [place] (Left)  Patient Location: PACU  Anesthesia Type:General  Level of Consciousness: awake, alert , oriented and patient cooperative  Airway & Oxygen Therapy: Patient Spontanous Breathing and Patient connected to face mask oxygen  Post-op Assessment: Report given to RN, Post -op Vital signs reviewed and stable and Patient moving all extremities  Post vital signs: Reviewed and stable  Last Vitals:  Filed Vitals:   12/22/14 1132  BP: 140/67  Pulse: 64  Temp: 36.6 C  Resp: 18    Complications: No apparent anesthesia complications

## 2014-12-22 NOTE — Progress Notes (Signed)
PHARMACIST - PHYSICIAN ORDER COMMUNICATION  CONCERNING: P&T Medication Policy on Herbal Medications  DESCRIPTION:  This patient's order for:  Biotin  has been noted.  This product(s) is classified as an "herbal" or natural product. Due to a lack of definitive safety studies or FDA approval, nonstandard manufacturing practices, plus the potential risk of unknown drug-drug interactions while on inpatient medications, the Pharmacy and Therapeutics Committee does not permit the use of "herbal" or natural products of this type within [facility].   ACTION TAKEN: The pharmacy department is unable to verify this order at this time and your patient has been informed of this safety policy. Please reevaluate patient's clinical condition at discharge and address if the herbal or natural product(s) should be resumed at that time.  Dia Sitter, PharmD, BCPS 12/22/2014 5:12 PM

## 2014-12-22 NOTE — Brief Op Note (Signed)
12/22/2014  3:19 PM  PATIENT:  Carol [name]  79 y.o. female  PRE-OPERATIVE DIAGNOSIS:  Spinal Stenosis Lumbar with Foraminal stenosis at lower Three levels.Stenosis at L-5-S-1 and S-1-S-2  POST-OPERATIVE DIAGNOSIS: Same as Pre-Op    PROCEDURE:  Procedure(s): CENTRAL DECOMPRESSIVE LUMBAR, AND LAMINECTOMY L5-S1,  S1-S2 FOR SPINAL STENOSIS FORAMINOTOMY L5, S1, S2 ROOTS ON LEFT FOR FORAMINAL STENOSIS, AND DECOMPRESSION L5-S1, S1-S2 ACCORDING TO LABELING OF Carol [name] (Left)  SURGEON:  Surgeon(s) and Role:    * Magnus Sinning, MD - Assisting    * Latanya Maudlin, MD - Primary  ASSISTANTS:james Aplington MD   ANESTHESIA:   general  EBL:  Total I/O In: 1000 [I.V.:1000] Out: 350 [Urine:350]  BLOOD ADMINISTERED:none  DRAINS: none   LOCAL MEDICATIONS USED:  MARCAINE20cc of 0.50% with Epinephrine at start of the case and Exparel 20cc mixed with 20cc of Normal Saline at the end of the case.     SPECIMEN:  No Specimen  DISPOSITION OF SPECIMEN:  N/A  COUNTS:  YES  TOURNIQUET:  * No tourniquets in log *  DICTATION: .Other Dictation: Dictation Number [id number]  PLAN OF CARE: Admit to inpatient   PATIENT DISPOSITION:  Stable in OR   Delay start of Pharmacological VTE agent (>24hrs) due to surgical blood loss or risk of bleeding: yes

## 2014-12-22 NOTE — Anesthesia Preprocedure Evaluation (Addendum)
Anesthesia Evaluation  Patient identified by MRN, date of birth, ID band Patient awake    Reviewed: Allergy & Precautions, NPO status , Patient's Chart, lab work & pertinent test results  Airway Mallampati: II  TM Distance: >3 FB Neck ROM: Full    Dental no notable dental hx.    Pulmonary asthma ,  breath sounds clear to auscultation  Pulmonary exam normal       Cardiovascular hypertension, + CAD, + Past MI, + Cardiac Stents (2015) and +CHF (EF 9/15 50-55%) Normal cardiovascular examRhythm:Regular Rate:Normal     Neuro/Psych TIA (1990s)negative psych ROS   GI/Hepatic negative GI ROS, Neg liver ROS,   Endo/Other  diabetes, Type 2  Renal/GU negative Renal ROS  negative genitourinary   Musculoskeletal negative musculoskeletal ROS (+)   Abdominal   Peds negative pediatric ROS (+)  Hematology negative hematology ROS (+)   Anesthesia Other Findings   Reproductive/Obstetrics negative OB ROS                            Anesthesia Physical Anesthesia Plan  ASA: III  Anesthesia Plan: General   Post-op Pain Management:    Induction: Intravenous  Airway Management Planned: Oral ETT  Additional Equipment:   Intra-op Plan:   Post-operative Plan: Extubation in OR  Informed Consent: I have reviewed the patients History and Physical, chart, labs and discussed the procedure including the risks, benefits and alternatives for the proposed anesthesia with the patient or authorized representative who has indicated his/her understanding and acceptance.   Dental advisory given  Plan Discussed with: CRNA  Anesthesia Plan Comments:         Anesthesia Quick Evaluation

## 2014-12-22 NOTE — Anesthesia Postprocedure Evaluation (Signed)
  Anesthesia Post-op Note  Patient: Carol [name]  Procedure(s) Performed: Procedure(s) (LRB): CENTRAL DECOMPRESSIVE LUMBAR, AND LAMINECTOMY L5-S1,  S1-S2 FOR SPINAL STENOSIS FORAMINOTOMY L5, S1, S2 ROOTS ON LEFT FOR FORAMINAL STENOSIS, AND DECOMPRESSION L5-S1, S1-S2 ACCORDING TO LABELING OF [place] (Left)  Patient Location: PACU  Anesthesia Type: General  Level of Consciousness: awake and alert   Airway and Oxygen Therapy: Patient Spontanous Breathing  Post-op Pain: mild  Post-op Assessment: Post-op Vital signs reviewed, Patient's Cardiovascular Status Stable, Respiratory Function Stable, Patent Airway and No signs of Nausea or vomiting  Last Vitals:  Filed Vitals:   12/22/14 1600  BP: 125/60  Pulse: 55  Temp:   Resp: 11    Post-op Vital Signs: stable   Complications: No apparent anesthesia complications

## 2014-12-23 ENCOUNTER — Encounter (HOSPITAL_COMMUNITY): Payer: Self-pay | Admitting: Orthopedic Surgery

## 2014-12-23 LAB — GLUCOSE, CAPILLARY
GLUCOSE-CAPILLARY: 265 mg/dL — AB (ref 65–99)
GLUCOSE-CAPILLARY: 192 mg/dL — AB (ref 65–99)
Glucose-Capillary: 169 mg/dL — ABNORMAL HIGH (ref 65–99)
Glucose-Capillary: 184 mg/dL — ABNORMAL HIGH (ref 65–99)

## 2014-12-23 MED ORDER — ONDANSETRON HCL 4 MG PO TABS
4.0000 mg | ORAL_TABLET | Freq: Four times a day (QID) | ORAL | Status: DC | PRN
  Administered 2014-12-23: 4 mg via ORAL
  Filled 2014-12-23: qty 1

## 2014-12-23 NOTE — Clinical Social Work Placement (Signed)
   CLINICAL SOCIAL WORK PLACEMENT  NOTE  Date:  12/23/2014  Patient Details  Name: Carol [name] MRN: [id number] Date of Birth: 09-Nov-1935  Clinical Social Work is seeking post-discharge placement for this patient at the [place] level of care (*CSW will initial, date and re-position this form in  chart as items are completed):  No   Patient/family provided with [employer] Work Department's list of facilities offering this level of care within the geographic area requested by the patient (or if unable, by the patient's family).  Yes   Patient/family informed of their freedom to choose among providers that offer the needed level of care, that participate in Medicare, Medicaid or managed care program needed by the patient, have an available bed and are willing to accept the patient.  No   Patient/family informed of [facility]'s ownership interest in [facility] and [facility], as well as of the fact that they are under no obligation to receive care at these facilities.  PASRR submitted to EDS on 12/23/14     PASRR number received on 12/23/14     Existing PASRR number confirmed on       FL2 transmitted to all facilities in geographic area requested by pt/family on 12/23/14     FL2 transmitted to all facilities within larger geographic area on       Patient informed that his/her managed care company has contracts with or will negotiate with certain facilities, including the following:        Yes   Patient/family informed of bed offers received.  Patient chooses bed at [facility]     Physician recommends and patient chooses bed at      Patient to be transferred to [place], [place] on  .  Patient to be transferred to facility by       Patient family notified on   of transfer.  Name of family member notified:        PHYSICIAN       Additional Comment:    _______________________________________________ Luretha Rued, LCSW 12/23/2014, 2:42 PM

## 2014-12-23 NOTE — Evaluation (Signed)
Occupational Therapy Evaluation Patient Details Name: Carol [name] MRN: [id number] DOB: 1935/09/23 Today's Date: 12/23/2014    History of Present Illness Pt is a 79 year old s/p CENTRAL DECOMPRESSIVE LUMBAR, AND LAMINECTOMY L5-S1, S1-S2 FOR SPINAL STENOSIS FORAMINOTOMY L5, S1, S2 ROOTS ON LEFT FOR FORAMINAL STENOSIS, AND DECOMPRESSION L5-S1, S1-S2   Clinical Impression   Pt limited this session by pain and nausea. Assisted with pivot back to bed with walker. Reviewed back precautions with ADL. Will benefit from continued OT to progress ADL independence and safety.    Follow Up Recommendations  SNF;Supervision/Assistance - 24 hour    Equipment Recommendations  Other (comment) (defer to next venue)    Recommendations for Other Services       Precautions / Restrictions Precautions Precautions: Fall;Back Precaution Comments: Pt stated 1/3 back precautions. Reviewed all precautions with pt. Restrictions Weight Bearing Restrictions: No      Mobility Bed Mobility Overal bed mobility: Needs Assistance Bed Mobility: Rolling;Sit to Sidelying Rolling: Min assist      Sit to sidelying: Mod assist General bed mobility comments: assist with LEs onto bed and positioning in sidelying due to pain and nausea. reviewed log roll technique.   Transfers Overall transfer level: Needs assistance Equipment used: Rolling walker (2 wheeled) Transfers: Sit to/from Stand Sit to Stand: Mod assist         General transfer comment: cues for back precautions.    Balance                                            ADL Overall ADL's : Needs assistance/impaired Eating/Feeding: Independent;Sitting   Grooming: Wash/dry hands;Set up;Sitting   Upper Body Bathing: Sitting;Minimal assitance   Lower Body Bathing: Maximal assistance;Sit to/from stand   Upper Body Dressing : Minimal assistance;Sitting   Lower Body Dressing: Total assistance;Sit to/from stand   Toilet  Transfer: Moderate assistance;Stand-pivot;RW   Toileting- Clothing Manipulation and Hygiene: Total assistance;Sit to/from stand         General ADL Comments: Pt continuing to feel nauseous this am and did vomit at start of session. Informed nursing who brought nausea meds. Pt requesting to return to bed so assisted with pivot back to bed. Pt has difficulty with sit to stand transtion and states L LE is also painful  X 4 months.      Vision     Perception     Praxis      Pertinent Vitals/Pain Pain Assessment: 0-10 Pain Score: 5  Pain Location: back and L LE Pain Descriptors / Indicators: Aching Pain Intervention(s): Repositioned;Monitored during session     Hand Dominance     Extremity/Trunk Assessment Upper Extremity Assessment Upper Extremity Assessment: Generalized weakness (pt states not able to reach either UE over her head but didnt have her reach over head due to "no arching" precaution.)          Communication Communication Communication: HOH   Cognition Arousal/Alertness: Awake/alert Behavior During Therapy: WFL for tasks assessed/performed Overall Cognitive Status: Within Functional Limits for tasks assessed                     General Comments       Exercises       Shoulder Instructions      Home Living Family/patient expects to be discharged to:: Skilled nursing facility Living Arrangements: Alone  Prior Functioning/Environment Level of Independence: Independent             OT Diagnosis: Generalized weakness;Acute pain   OT Problem List: Decreased strength;Decreased knowledge of use of DME or AE;Decreased knowledge of precautions;Pain   OT Treatment/Interventions:      OT Goals(Current goals can be found in the care plan section) Acute Rehab OT Goals Patient Stated Goal: decrease nausea and pain OT Goal Formulation: With patient Time For Goal Achievement: 12/30/14 Potential  to Achieve Goals: Good  OT Frequency:     Barriers to D/C:            Co-evaluation              End of Session Equipment Utilized During Treatment: Gait belt;Rolling walker  Activity Tolerance: Patient limited by pain;Other (comment) (nausea) Patient left: in bed;with call bell/phone within reach   Time: 1027-1045 OT Time Calculation (min): 18 min Charges:  OT General Charges $OT Visit: 1 Procedure OT Evaluation $Initial OT Evaluation Tier I: 1 Procedure G-Codes:    Jules Schick  [id number] 12/23/2014, 12:39 PM

## 2014-12-23 NOTE — Op Note (Signed)
NAMECOLLEENE, AMSLER NO.:  [id number]  MEDICAL RECORD NO.:  [id number]  LOCATION:  [id number]                         FACILITY:  [facility]  PHYSICIAN:  Kipp Brood. Ayn Domangue, M.D.DATE OF BIRTH:  Jan 24, 1936  DATE OF PROCEDURE:  12/22/2014 DATE OF DISCHARGE:                              OPERATIVE REPORT   PREOPERATIVE DIAGNOSES: 1. Spinal stenosis at L5-S1 and S1-S2 levels. 2. Foraminal stenosis for the S2, S1, and L5 nerve roots on the left.     All her symptoms were on the left with weakness of the dorsiflexors     of the left foot.  SURGEON:  Kipp Brood. Gladstone Lighter, M.D.  ASSISTANT:  Tarri Glenn, M.D.  POSTOPERATIVE DIAGNOSES: 1. Spinal stenosis at L5-S1 and S1-S2 levels. 2. Foraminal stenosis for the S2, S1, and L5 nerve roots on the left.     All her symptoms were on the left with weakness of the dorsiflexors     of the left foot.  Note:  I would like to make a special note.  Our MRI in the office was read by radiologist outside this hospital and they called the last open space L5-S1 labeled here, it was labeled S1-S2, so it is basically the same.  We are talking the last 2 interspaces that we were concerned about the last space at the hospital labeled as S1-S2 is the osteophytic degenerative disk space.  The level above is the first normal open space, I just want to get that clear.  DESCRIPTION OF PROCEDURE:  So, anyway, at this time, appropriate time- out was carried out.  I also marked the appropriate left side of the back in the holding area.  The patient had 2 g of IV Ancef.  At this time, 2 needles were placed in the back for localization purposes and x- ray was taken.  An incision then was made with the patient on the spinal frame.  I then extended the incision proximally and distally.  Bleeders were identified and cauterized.  I then stripped the muscle from the lamina spinous process bilaterally at both levels.  Following that, 2 instruments were placed  in the back for localization purposes.  The last 2 spinous processes, one we were concerned about.  We then inserted our McCullough retractors.  I then did a central decompressive lumbar laminectomy.  According to the hospital x-rays, it was S1 AND S2.  I went up proximally at L5-S1 as well and then we went out, brought the microscope and gently removed the ligamentum flavum.  We had a severe constriction of the lateral recess on the left and that is where all her symptoms were.  We gently protected the dura at all times with cottonoids.  I also utilized the hockey-stick to compress the dura in that area together well away from the recesses and then did a nice lateral recess decompression.  We went all along the lateral gutter.  We also decompressed the opposite side as well.  We went down and did foraminotomies for the last 3 roots, the S2, S1, L5 roots on the left. We had a nice decompression.  We were able to easily now pass a hockey- stick proximally  and distally.  Another x-ray was taken also with instruments in place.  We went out to the foramina very easily with a hockey-stick bilaterally as well, had good decompression.  We thoroughly irrigated out the area and loosely applied some thrombin-soaked Gelfoam. The wound was closed in layers in usual fashion.  I left a small distal deep and proximal part of the wound open for drainage purposes.  We then injected dissipate Exparel 20 mL into the soft tissue.  Note:  At the beginning of the case, I injected 20 mL of 0.5% Marcaine with epinephrine in the soft tissue to control bleeding.  After the wound was closed, I then stapled and then applied a Neosporin bundle dressing.  The patient left the operating room in satisfactory condition.          ______________________________ Kipp Brood Gladstone Lighter, M.D.     RAG/MEDQ  D:  12/22/2014  T:  12/23/2014  Job:  [id number]

## 2014-12-23 NOTE — Progress Notes (Signed)
Utilization review completed.  

## 2014-12-23 NOTE — Care Management Note (Signed)
Case Management Note  Patient Details  Name: NAIOVY THUNE MRN: [id number] Date of Birth: 06-13-1936  Subjective/Objective:                   CENTRAL DECOMPRESSIVE LUMBAR, AND LAMINECTOMY L5-S1, S1-S2 FOR SPINAL STENOSIS FORAMINOTOMY L5, S1, S2 ROOTS ON LEFT FOR FORAMINAL STENOSIS, AND DECOMPRESSION L5-S1, S1-S2 ACCORDING TO LABELING OF [place] X-RAYS (Left) Action/Plan: Discharge planning  Expected Discharge Date:  12/24/14               Expected Discharge Plan:  Skilled Nursing Facility  In-House Referral:     Discharge planning Services  CM Consult  Post Acute Care Choice:    Choice offered to:     DME Arranged:    DME Agency:     HH Arranged:    [place] Agency:     Status of Service:     Medicare Important Message Given:    Date Medicare IM Given:    Medicare IM give by:    Date Additional Medicare IM Given:    Additional Medicare Important Message give by:     If discussed at [place] of Stay Meetings, dates discussed:    Additional Comments: CM notes pt to go to SNF; CSW arranging.  No other CM needs were communicated. Dellie Catholic, RN 12/23/2014, 12:05 PM

## 2014-12-23 NOTE — Clinical Social Work Note (Signed)
Clinical Social Work Assessment  Patient Details  Name: Carol [name] MRN: [id number] Date of Birth: 1936/02/05  Date of referral:  12/23/14               Reason for consult:  Discharge Planning, Facility Placement                Permission sought to share information with:    Permission granted to share information::     Name::        Agency::     Relationship::     Contact Information:     Housing/Transportation Living arrangements for the past 2 months:  Single Family Home Source of Information:  Patient Patient Interpreter Needed:  None Criminal Activity/Legal Involvement Pertinent to Current Situation/Hospitalization:  No - Comment as needed Significant Relationships:  Adult Children Lives with:  Self Do you feel safe going back to the place where you live?   (SNF placement needed.) Need for family participation in patient care:  No (Coment)  Care giving concerns:  Pt's care cannot be managed at home following hospital d/c.   Social Worker assessment / plan:  Pt hospitalized on 12/22/14 for pre planned surgery. CSW met with pt to assist with d/c planning. PT has recommended ST Rehab at d/c. Pt has requested [facility] in [place] for placement. SNF contacted and is able to make a bed offer.  Employment status:  Retired Nurse, adult PT Recommendations:  [place] / Referral to community resources:  [place]  Patient/Family's Response to care:  Pt feels ST Rehab is needed at d/c.  Patient/Family's Understanding of and Emotional Response to Diagnosis, Current Treatment, and Prognosis:  Pt is relieved [facility] has an opening for her. She is motivated to work with therapy and is looking forward to feeling better.  Emotional Assessment Appearance:  Appears stated age Attitude/Demeanor/Rapport:  Other (cooperative) Affect (typically observed):  Calm, Pleasant Orientation:  Oriented to Self, Oriented to Place,  Oriented to  Time, Oriented to Situation Alcohol / Substance use:  Not Applicable Psych involvement (Current and /or in the community):  No (Comment)  Discharge Needs  Concerns to be addressed:  Discharge Planning Concerns Readmission within the last 30 days:  No Current discharge risk:  None Barriers to Discharge:  No Barriers Identified   Luretha Rued, [place] 12/23/2014, 2:36 PM

## 2014-12-23 NOTE — Progress Notes (Signed)
Subjective: 1 Day Post-Op Procedure(s) (LRB): CENTRAL DECOMPRESSIVE LUMBAR, AND LAMINECTOMY L5-S1,  S1-S2 FOR SPINAL STENOSIS FORAMINOTOMY L5, S1, S2 ROOTS ON LEFT FOR FORAMINAL STENOSIS, AND DECOMPRESSION L5-S1, S1-S2 ACCORDING TO LABELING OF [place] X-RAYS (Left) Patient reports pain as 5 on 0-10 scale. Better strength in lowers since surgery,but she has significant pain in her back. We are evaluating her for SNF tomorrow.   Objective: Vital signs in last 24 hours: Temp:  [97.4 F (36.3 C)-99 F (37.2 C)] 99 F (37.2 C) (06/30 0530) Pulse Rate:  [55-73] 71 (06/30 0530) Resp:  [9-18] 16 (06/30 0530) BP: (108-165)/(50-70) 108/56 mmHg (06/30 0530) SpO2:  [95 %-100 %] 100 % (06/30 0530) Weight:  [94.257 kg (207 lb 12.8 oz)] 94.257 kg (207 lb 12.8 oz) (06/29 1202)  Intake/Output from previous day: 06/29 0701 - 06/30 0700 In: 3148.3 [P.O.:700; I.V.:2348.3; IV Piggyback:100] Out: 2350 [Urine:2300; Blood:50] Intake/Output this shift: Total I/O In: 1708.3 [P.O.:460; I.V.:1148.3; IV Piggyback:100] Out: 1000 [Urine:1000]   Recent Labs  12/20/14 1050  HGB 10.9*    Recent Labs  12/20/14 1050  WBC 6.6  RBC 3.94  HCT 34.6*  PLT 263    Recent Labs  12/20/14 1050  NA 138  K 4.6  CL 100*  CO2 28  BUN 44*  CREATININE 1.19*  GLUCOSE 137*  CALCIUM 9.6    Recent Labs  12/20/14 1050  INR 1.02    Neurologically intact  Assessment/Plan: 1 Day Post-Op Procedure(s) (LRB): CENTRAL DECOMPRESSIVE LUMBAR, AND LAMINECTOMY L5-S1,  S1-S2 FOR SPINAL STENOSIS FORAMINOTOMY L5, S1, S2 ROOTS ON LEFT FOR FORAMINAL STENOSIS, AND DECOMPRESSION L5-S1, S1-S2 ACCORDING TO LABELING OF [place] X-RAYS (Left) Up with therapy  Bronte Sabado A 12/23/2014, 6:58 AM

## 2014-12-23 NOTE — Evaluation (Signed)
Physical Therapy Evaluation Patient Details Name: Carol [name] MRN: [id number] DOB: 1936/02/15 Today's Date: 12/23/2014   History of Present Illness  Pt is a 79 year old s/p CENTRAL DECOMPRESSIVE LUMBAR, AND LAMINECTOMY L5-S1, S1-S2 FOR SPINAL STENOSIS FORAMINOTOMY L5, S1, S2 ROOTS ON LEFT FOR FORAMINAL STENOSIS, AND DECOMPRESSION L5-S1, S1-S2  Clinical Impression  Patient is s/p above surgery resulting in the deficits listed below (see PT Problem List).  Patient will benefit from skilled PT to increase their independence and safety with mobility (while adhering to their precautions) to allow discharge to the venue listed below.  Pt with limited mobility today due to nausea and emesis.     Follow Up Recommendations SNF    Equipment Recommendations  None recommended by PT    Recommendations for Other Services       Precautions / Restrictions Precautions Precautions: Fall;Back Precaution Comments: Pt stated 1/3 back precautions. Reviewed all precautions with pt. Restrictions Weight Bearing Restrictions: No      Mobility  Bed Mobility Overal bed mobility: Needs Assistance Bed Mobility: Supine to Sit     Supine to sit: Min assist;HOB elevated     General bed mobility comments: educated on log roll technique but pt has difficulty performing so assist trunk upright with cues to keep back straight  Transfers Overall transfer level: Needs assistance Equipment used: Rolling walker (2 wheeled) Transfers: Sit to/from Stand Sit to Stand: Min assist         General transfer comment: verbal cues for technique within precautions  Ambulation/Gait Ambulation/Gait assistance: Min guard Ambulation Distance (Feet): 20 Feet Assistive device: Rolling walker (2 wheeled) Gait Pattern/deviations: Step-through pattern;Decreased stride length Gait velocity: decr   General Gait Details: verbal cues for safe use of RW, slow pace, nausea near end of ambulation, pt assisted to sitting  then had emesis  Stairs            Wheelchair Mobility    Modified Rankin (Stroke Patients Only)       Balance                                             Pertinent Vitals/Pain Pain Assessment: 0-10 Pain Score: 5  Pain Location: back and L LE Pain Descriptors / Indicators: Aching Pain Intervention(s): Repositioned;Monitored during session    Home Living Family/patient expects to be discharged to:: Skilled nursing facility Living Arrangements: Alone                    Prior Function Level of Independence: Independent               Hand Dominance        Extremity/Trunk Assessment               Lower Extremity Assessment: LLE deficits/detail   LLE Deficits / Details: numbness pre and post surgery, able to perform ankle pumps, also reports L knee and lower leg pain pre and post surgery     Communication   Communication: HOH  Cognition Arousal/Alertness: Awake/alert Behavior During Therapy: WFL for tasks assessed/performed Overall Cognitive Status: Within Functional Limits for tasks assessed                      General Comments      Exercises        Assessment/Plan    PT Assessment  PT Diagnosis Difficulty walking;Acute pain   PT Problem List    PT Treatment Interventions     PT Goals (Current goals can be found in the Care Plan section) Acute Rehab PT Goals PT Goal Formulation: With patient Time For Goal Achievement: 12/29/14 Potential to Achieve Goals: Good    Frequency     Barriers to discharge        Co-evaluation               End of Session Equipment Utilized During Treatment: Gait belt Activity Tolerance: Other (comment) (limited by nausea and vomiting) Patient left: in chair;with call bell/phone within reach           Time: 0856-0924 PT Time Calculation (min) (ACUTE ONLY): 28 min   Charges:   PT Evaluation $Initial PT Evaluation Tier I: 1 Procedure     PT G  Codes:        Bryker Fletchall,KATHrine E 12/23/2014, 12:32 PM Carmelia Bake, PT, DPT 12/23/2014 Pager: [phone]

## 2014-12-24 DIAGNOSIS — M4806 Spinal stenosis, lumbar region: Secondary | ICD-10-CM | POA: Diagnosis not present

## 2014-12-24 DIAGNOSIS — G8918 Other acute postprocedural pain: Secondary | ICD-10-CM | POA: Diagnosis not present

## 2014-12-24 DIAGNOSIS — I10 Essential (primary) hypertension: Secondary | ICD-10-CM | POA: Diagnosis not present

## 2014-12-24 DIAGNOSIS — I2510 Atherosclerotic heart disease of native coronary artery without angina pectoris: Secondary | ICD-10-CM | POA: Diagnosis not present

## 2014-12-24 DIAGNOSIS — E785 Hyperlipidemia, unspecified: Secondary | ICD-10-CM | POA: Diagnosis not present

## 2014-12-24 DIAGNOSIS — Z4889 Encounter for other specified surgical aftercare: Secondary | ICD-10-CM | POA: Diagnosis not present

## 2014-12-24 DIAGNOSIS — R262 Difficulty in walking, not elsewhere classified: Secondary | ICD-10-CM | POA: Diagnosis not present

## 2014-12-24 DIAGNOSIS — M1A9XX0 Chronic gout, unspecified, without tophus (tophi): Secondary | ICD-10-CM | POA: Diagnosis not present

## 2014-12-24 DIAGNOSIS — I5030 Unspecified diastolic (congestive) heart failure: Secondary | ICD-10-CM | POA: Diagnosis not present

## 2014-12-24 DIAGNOSIS — D649 Anemia, unspecified: Secondary | ICD-10-CM | POA: Diagnosis not present

## 2014-12-24 DIAGNOSIS — J45909 Unspecified asthma, uncomplicated: Secondary | ICD-10-CM | POA: Diagnosis not present

## 2014-12-24 LAB — GLUCOSE, CAPILLARY: Glucose-Capillary: 193 mg/dL — ABNORMAL HIGH (ref 65–99)

## 2014-12-24 MED ORDER — TIZANIDINE HCL 4 MG PO TABS: 4.0000 mg | ORAL_TABLET | Freq: Three times a day (TID) | ORAL | Status: DC | PRN

## 2014-12-24 MED ORDER — OXYCODONE-ACETAMINOPHEN 5-325 MG PO TABS: 1.0000 | ORAL_TABLET | ORAL | Status: DC | PRN

## 2014-12-24 NOTE — Plan of Care (Signed)
Problem: Consults Goal: Diagnosis - Spinal Surgery Outcome: Completed/Met Date Met:  12/24/14 Lumbar Laminectomy (Complex)     

## 2014-12-24 NOTE — Progress Notes (Signed)
Subjective: 2 Days Post-Op Procedure(s) (LRB): CENTRAL DECOMPRESSIVE LUMBAR, AND LAMINECTOMY L5-S1,  S1-S2 FOR SPINAL STENOSIS FORAMINOTOMY L5, S1, S2 ROOTS ON LEFT FOR FORAMINAL STENOSIS, AND DECOMPRESSION L5-S1, S1-S2 ACCORDING TO LABELING OF [place] X-RAYS (Left) Patient reports pain as mild.   Patient seen in rounds for Dr. Gladstone Lighter. Patient is well, but has had some minor complaints of continued pain in her left leg. Voiding well. Positive flatus. No issues overnight.  Plan is to go Skilled nursing facility after hospital stay.  Objective: Vital signs in last 24 hours: Temp:  [99.1 F (37.3 C)-100.5 F (38.1 C)] 99.4 F (37.4 C) (07/01 0600) Pulse Rate:  [67-87] 80 (07/01 0600) Resp:  [16-18] 18 (07/01 0600) BP: (111-131)/(49-53) 131/53 mmHg (07/01 0600) SpO2:  [95 %-99 %] 99 % (07/01 0600)  Intake/Output from previous day:  Intake/Output Summary (Last 24 hours) at 12/24/14 0729 Last data filed at 12/24/14 0601  Gross per 24 hour  Intake 1301.67 ml  Output   1077 ml  Net 224.67 ml     EXAM General - Patient is Alert and Oriented Extremity - Neurologically intact Neurovascular intact Dorsiflexion/Plantar flexion intact Compartment soft Dressing/Incision - clean, dry, no drainage Motor Function - intact, moving foot and toes well on exam.   Past Medical History  Diagnosis Date  . Hypertension   . Asthma   . Hyperlipidemia     a. H/o intolerance to Zocor, Lipitor. Had muscle aches on higher dose Crestor.  . Gout, unspecified   . CAD S/P percutaneous coronary angioplasty 01/22/2014    A. (12/2013): POBA - OM2 99% (very tortuous segment) - reduced ~ 50% & TIMI 3 flow, 80-90% stenosis in mid PDA, Irregularities <50% in mRCA, pLAD and prox LCx; b. 02/17/14: Moderate sized fixed defect in Lateral wall --> cath with OM2 restenosis & otherwise stable --> Xience Alpine DES x 2 (2.25 mm x 12 & 8 mm). c. NSTEMI 02/2014 s/p PTCA/balloon angioplasty only to small  caliber PDA.  Marland Kitchen Chronic systolic heart failure     a. ICM b. ECHO (01/2014): EF 25-30%, grade I DD, trivial MR. c. EF by Myoview 02/17/14 = ~53%. d. EF by cath 02/24/14 - improved to 50-55%.  . Environmental allergies   . Type II diabetes mellitus   . GERD (gastroesophageal reflux disease)   . TIA (transient ischemic attack) 1990's    a. TIA vs stroke 1990's "mild" - sounds like a TIA as she was told she had stroke symptoms but negative scans. Denies residual effects.  . Contrast media allergy   . QT prolongation     a. Noted on EKG 02/2014.  Marland Kitchen Anemia     a. Noted on labs 02/2014 possibly procedurally related.  Marland Kitchen LBBB (left bundle branch block)     a. Transient during 02/2014 admission.  . CKD (chronic kidney disease), stage III   . Sinus bradycardia     a. HR 40s-50s during 02/2014 admission, limiting BB dose.  . NSTEMI (non-ST elevated myocardial infarction) 2014; 12/2013, 01/2014  . Hypothyroidism     Assessment/Plan: 2 Days Post-Op Procedure(s) (LRB): CENTRAL DECOMPRESSIVE LUMBAR, AND LAMINECTOMY L5-S1,  S1-S2 FOR SPINAL STENOSIS FORAMINOTOMY L5, S1, S2 ROOTS ON LEFT FOR FORAMINAL STENOSIS, AND DECOMPRESSION L5-S1, S1-S2 ACCORDING TO LABELING OF [place] X-RAYS (Left) Active Problems:   Spinal stenosis, lumbar region, with neurogenic claudication  Estimated body mass index is 32.05 kg/(m^2) as calculated from the following:   Height as of this encounter: 5'  7.5" (1.715 m).   Weight as of this encounter: 94.257 kg (207 lb 12.8 oz). Advance diet Up with therapy D/C IV fluids Discharge to SNF  Will plan for DC to [facility] [place] today.   Ardeen Jourdain, PA-C Orthopaedic Surgery 12/24/2014, 7:29 AM

## 2014-12-24 NOTE — Progress Notes (Signed)
Physical Therapy Treatment Patient Details Name: Carol [name] MRN: [id number] DOB: 02-23-1936 Today's Date: 12/29/2014    History of Present Illness Pt is a 79 year old s/p CENTRAL DECOMPRESSIVE LUMBAR, AND LAMINECTOMY L5-S1, S1-S2 FOR SPINAL STENOSIS FORAMINOTOMY L5, S1, S2 ROOTS ON LEFT FOR FORAMINAL STENOSIS, AND DECOMPRESSION L5-S1, S1-S2    PT Comments    Pt up in recliner on arrival and assisted with ambulation then returned to bed.    Follow Up Recommendations  SNF     Equipment Recommendations  None recommended by PT    Recommendations for Other Services       Precautions / Restrictions Precautions Precautions: Fall;Back Precaution Comments: reviewed all back precautions    Mobility  Bed Mobility Overal bed mobility: Needs Assistance Bed Mobility: Rolling;Sit to Sidelying Rolling: Min assist       Sit to sidelying: Mod assist General bed mobility comments: verbal cues for log roll technique, pt with poor ability to perform without assist due to L LE pain  Transfers Overall transfer level: Needs assistance Equipment used: Rolling walker (2 wheeled) Transfers: Sit to/from Stand Sit to Stand: Min assist         General transfer comment: verbal cues for precautions, assist for rise and controlling descent  Ambulation/Gait Ambulation/Gait assistance: Min guard Ambulation Distance (Feet): 100 Feet Assistive device: Rolling walker (2 wheeled) Gait Pattern/deviations: Step-through pattern;Decreased stride length     General Gait Details: verbal cues for safe use of RW, slow pace   Stairs            Wheelchair Mobility    Modified Rankin (Stroke Patients Only)       Balance                                    Cognition Arousal/Alertness: Awake/alert Behavior During Therapy: WFL for tasks assessed/performed Overall Cognitive Status: Within Functional Limits for tasks assessed                      Exercises       General Comments        Pertinent Vitals/Pain Pain Assessment: 0-10 Pain Score: 5  Pain Location: back and L knee Pain Descriptors / Indicators: Aching Pain Intervention(s): Monitored during session;Limited activity within patient's tolerance;Repositioned    Home Living                      Prior Function            PT Goals (current goals can now be found in the care plan section) Progress towards PT goals: Progressing toward goals    Frequency       PT Plan Current plan remains appropriate    Co-evaluation             End of Session   Activity Tolerance: Patient tolerated treatment well Patient left: in bed;with call bell/phone within reach     Time: [id number] PT Time Calculation (min) (ACUTE ONLY): 14 min  Charges:  $Gait Training: 8-22 mins                    G Codes:      Ellouise Mcwhirter,KATHrine E 29-Dec-2014, 12:25 PM Carmelia Bake, PT, DPT Dec 29, 2014 Pager: [phone]

## 2014-12-24 NOTE — Discharge Summary (Signed)
Physician Discharge Summary   Patient ID: Carol [name] MRN: [id number] DOB/AGE: June 09, 1936 79 y.o.  Admit date: 12/22/2014 Discharge date: 12/24/2014  Primary Diagnosis: Lumbar spinal stenosis   Admission Diagnoses:  Past Medical History  Diagnosis Date  . Hypertension   . Asthma   . Hyperlipidemia     a. H/o intolerance to Zocor, Lipitor. Had muscle aches on higher dose Crestor.  . Gout, unspecified   . CAD S/P percutaneous coronary angioplasty 01/22/2014    A. (12/2013): POBA - OM2 99% (very tortuous segment) - reduced ~ 50% & TIMI 3 flow, 80-90% stenosis in mid PDA, Irregularities <50% in mRCA, pLAD and prox LCx; b. 02/17/14: Moderate sized fixed defect in Lateral wall --> cath with OM2 restenosis & otherwise stable --> Xience Alpine DES x 2 (2.25 mm x 12 & 8 mm). c. NSTEMI 02/2014 s/p PTCA/balloon angioplasty only to small caliber PDA.  Marland Kitchen Chronic systolic heart failure     a. ICM b. ECHO (01/2014): EF 25-30%, grade I DD, trivial MR. c. EF by Myoview 02/17/14 = ~53%. d. EF by cath 02/24/14 - improved to 50-55%.  . Environmental allergies   . Type II diabetes mellitus   . GERD (gastroesophageal reflux disease)   . TIA (transient ischemic attack) 1990's    a. TIA vs stroke 1990's "mild" - sounds like a TIA as she was told she had stroke symptoms but negative scans. Denies residual effects.  . Contrast media allergy   . QT prolongation     a. Noted on EKG 02/2014.  Marland Kitchen Anemia     a. Noted on labs 02/2014 possibly procedurally related.  Marland Kitchen LBBB (left bundle branch block)     a. Transient during 02/2014 admission.  . CKD (chronic kidney disease), stage III   . Sinus bradycardia     a. HR 40s-50s during 02/2014 admission, limiting BB dose.  . NSTEMI (non-ST elevated myocardial infarction) 2014; 12/2013, 01/2014  . Hypothyroidism    Discharge Diagnoses:   Active Problems:   Spinal stenosis, lumbar region, with neurogenic claudication  Estimated body mass index is 32.05 kg/(m^2) as calculated  from the following:   Height as of this encounter: 5' 7.5" (1.715 m).   Weight as of this encounter: 94.257 kg (207 lb 12.8 oz).  Procedure:  Procedure(s) (LRB): CENTRAL DECOMPRESSIVE LUMBAR, AND LAMINECTOMY L5-S1,  S1-S2 FOR SPINAL STENOSIS FORAMINOTOMY L5, S1, S2 ROOTS ON LEFT FOR FORAMINAL STENOSIS, AND DECOMPRESSION L5-S1, S1-S2 ACCORDING TO LABELING OF [place] X-RAYS (Left)   Consults: None  HPI: The patient is a 79 year old female who presents with back pain. The patient reports low back symptoms including pain, low back pain, tightness, stiffness, burning and pain with range of motion which began two months ago without any known injury. Symptoms are reported to be located in the left low back and Symptoms include pain, burning, stiffness and tightness. The pain radiates to the left buttock and left lateral lower leg  Laboratory Data: Admission on 12/22/2014  Component Date Value Ref Range Status  . Glucose-Capillary 12/22/2014 87  65 - 99 mg/dL Final  . Glucose-Capillary 12/22/2014 103* 65 - 99 mg/dL Final  . Comment 1 12/22/2014 Notify RN   Final  . Glucose-Capillary 12/22/2014 179* 65 - 99 mg/dL Final  . Glucose-Capillary 12/23/2014 169* 65 - 99 mg/dL Final  . Glucose-Capillary 12/23/2014 184* 65 - 99 mg/dL Final  . Comment 1 12/23/2014 Notify RN   Final  . Comment 2 12/23/2014 Document  in Chart   Final  . Glucose-Capillary 12/23/2014 265* 65 - 99 mg/dL Final  . Glucose-Capillary 12/23/2014 192* 65 - 99 mg/dL Final  . Glucose-Capillary 12/24/2014 193* 65 - 99 mg/dL Final  Hospital Outpatient Visit on 12/20/2014  Component Date Value Ref Range Status  . MRSA, PCR 12/20/2014 NEGATIVE  NEGATIVE Final  . Staphylococcus aureus 12/20/2014 NEGATIVE  NEGATIVE Final   Comment:        The Xpert SA Assay (FDA approved for NASAL specimens in patients over 34 years of age), is one component of a comprehensive surveillance program.  Test performance has been validated by  [facility] for patients greater than or equal to 55 year old. It is not intended to diagnose infection nor to guide or monitor treatment.   Marland Kitchen aPTT 12/20/2014 34  24 - 37 seconds Final  . WBC 12/20/2014 6.6  4.0 - 10.5 K/uL Final  . RBC 12/20/2014 3.94  3.87 - 5.11 MIL/uL Final  . Hemoglobin 12/20/2014 10.9* 12.0 - 15.0 g/dL Final  . HCT 12/20/2014 34.6* 36.0 - 46.0 % Final  . MCV 12/20/2014 87.8  78.0 - 100.0 fL Final  . MCH 12/20/2014 27.7  26.0 - 34.0 pg Final  . MCHC 12/20/2014 31.5  30.0 - 36.0 g/dL Final  . RDW 12/20/2014 15.9* 11.5 - 15.5 % Final  . Platelets 12/20/2014 263  150 - 400 K/uL Final  . Neutrophils Relative % 12/20/2014 61  43 - 77 % Final  . Neutro Abs 12/20/2014 4.1  1.7 - 7.7 K/uL Final  . Lymphocytes Relative 12/20/2014 27  12 - 46 % Final  . Lymphs Abs 12/20/2014 1.8  0.7 - 4.0 K/uL Final  . Monocytes Relative 12/20/2014 10  3 - 12 % Final  . Monocytes Absolute 12/20/2014 0.7  0.1 - 1.0 K/uL Final  . Eosinophils Relative 12/20/2014 1  0 - 5 % Final  . Eosinophils Absolute 12/20/2014 0.1  0.0 - 0.7 K/uL Final  . Basophils Relative 12/20/2014 1  0 - 1 % Final  . Basophils Absolute 12/20/2014 0.0  0.0 - 0.1 K/uL Final  . Sodium 12/20/2014 138  135 - 145 mmol/L Final  . Potassium 12/20/2014 4.6  3.5 - 5.1 mmol/L Final  . Chloride 12/20/2014 100* 101 - 111 mmol/L Final  . CO2 12/20/2014 28  22 - 32 mmol/L Final  . Glucose, Bld 12/20/2014 137* 65 - 99 mg/dL Final  . BUN 12/20/2014 44* 6 - 20 mg/dL Final  . Creatinine, Ser 12/20/2014 1.19* 0.44 - 1.00 mg/dL Final  . Calcium 12/20/2014 9.6  8.9 - 10.3 mg/dL Final  . Total Protein 12/20/2014 7.7  6.5 - 8.1 g/dL Final  . Albumin 12/20/2014 4.3  3.5 - 5.0 g/dL Final  . AST 12/20/2014 18  15 - 41 U/L Final  . ALT 12/20/2014 13* 14 - 54 U/L Final  . Alkaline Phosphatase 12/20/2014 125  38 - 126 U/L Final  . Total Bilirubin 12/20/2014 0.6  0.3 - 1.2 mg/dL Final  . GFR calc non Af Amer 12/20/2014 43* >60 mL/min Final    . GFR calc Af Amer 12/20/2014 50* >60 mL/min Final   Comment: (NOTE) The eGFR has been calculated using the CKD EPI equation. This calculation has not been validated in all clinical situations. eGFR's persistently <60 mL/min signify possible Chronic Kidney Disease.   . Anion gap 12/20/2014 10  5 - 15 Final  . Prothrombin Time 12/20/2014 13.6  11.6 - 15.2 seconds Final  .  INR 12/20/2014 1.02  0.00 - 1.49 Final  . Color, Urine 12/20/2014 YELLOW  YELLOW Final  . APPearance 12/20/2014 CLOUDY* CLEAR Final  . Specific Gravity, Urine 12/20/2014 1.007  1.005 - 1.030 Final  . pH 12/20/2014 6.0  5.0 - 8.0 Final  . Glucose, UA 12/20/2014 NEGATIVE  NEGATIVE mg/dL Final  . Hgb urine dipstick 12/20/2014 SMALL* NEGATIVE Final  . Bilirubin Urine 12/20/2014 NEGATIVE  NEGATIVE Final  . Ketones, ur 12/20/2014 NEGATIVE  NEGATIVE mg/dL Final  . Protein, ur 12/20/2014 NEGATIVE  NEGATIVE mg/dL Final  . Urobilinogen, UA 12/20/2014 0.2  0.0 - 1.0 mg/dL Final  . Nitrite 12/20/2014 NEGATIVE  NEGATIVE Final  . Leukocytes, UA 12/20/2014 LARGE* NEGATIVE Final  . Squamous Epithelial / LPF 12/20/2014 RARE  RARE Final  . WBC, UA 12/20/2014 21-50  <3 WBC/hpf Final  . RBC / HPF 12/20/2014 3-6  <3 RBC/hpf Final  . Bacteria, UA 12/20/2014 MANY* RARE Final     X-Rays:Dg Lumbar Spine 2-3 Views  12/20/2014   CLINICAL DATA:  Preoperative examination prior to lumbar surgery planned for December 22, 2014  EXAM: LUMBAR SPINE - 2-3 VIEW  COMPARISON:  Abdominal film of August 13, 2014 and CT scan of the chest dated March 06, 2014  FINDINGS: There are assumed to be 5 lumbar vertebral bodies with S1 being transitional.  The lumbar vertebral bodies are preserved in height. There is mild disc space narrowing at L4-5, L5-S1, moderate narrowing at S1-S2. There is no spondylolisthesis. The pedicles and transverse processes are intact. There are moderate-sized left lateral bridging osteophytes at L2-3 and at L3-4. The observed portions  of the sacrum exhibit no acute abnormalities.  IMPRESSION: 1. Please see the note above regarding numbering of the lumbar segments. For more accurate localization, an AP view of the thoracic spine would be useful. 2. There are degenerative disc changes as described. Correlation with outside MRI is needed.   Electronically Signed   By: David  Martinique M.D.   On: 12/20/2014 12:55   Dg Spine Portable 1 View  12/22/2014   CLINICAL DATA:  Surgical level L5-S1 and S1-2.  EXAM: PORTABLE SPINE - 1 VIEW  COMPARISON:  Intraoperative radiographs performed same day.  FINDINGS: Single intraoperative cross-table lateral view of the lumbar spine is submitted. Numbering system utilized on radiographs performed today and 12/20/2014 is preserved. Surgical instrument tips project at the L5 and S2 levels.  IMPRESSION: Surgical instrument tips at the L5 and S2 levels.   Electronically Signed   By: Lorin Picket M.D.   On: 12/22/2014 14:40   Dg Spine Portable 1 View  12/22/2014   CLINICAL DATA:  Surgical level L5-S1 and S1-2.  EXAM: PORTABLE SPINE - 1 VIEW  COMPARISON:  Lumbar spine radiographs 12/20/2014 and intraoperative views earlier the same date.  FINDINGS: Current numbering is again based on the radiographs obtained 12/20/2014. This is the numbering applied to the earlier localization examination today. No cross-sectional imaging of the lumbar spine available. Transitional lumbosacral segment is assigned S1.  Cross-table lateral view labeled 2 at 1402 hours demonstrates posterior localizing instruments over the L5 and S1 spinous processes.  IMPRESSION: Intraoperative localization as described. Numbering based on recent radiographs.   Electronically Signed   By: Richardean Sale M.D.   On: 12/22/2014 14:15   Dg Spine Portable 1 View  12/22/2014   CLINICAL DATA:  Surgical level L5-S1 and S1-2.  EXAM: PORTABLE SPINE - 1 VIEW  COMPARISON:  12/20/2014.  FINDINGS: A single intraoperative cross-table lateral  view lumbar spine is  submitted. Numbering system utilized on comparison examination 12/20/2014 is preserved. Surgical instrument tips project posterior to the L5 and S1 spinous processes.  IMPRESSION: Intraoperative localization at L5 and S1.   Electronically Signed   By: Lorin Picket M.D.   On: 12/22/2014 14:02    EKG: Orders placed or performed in visit on 09/30/14  . EKG 12-Lead     Hospital Course: Carol [name] is a 79 y.o. who was admitted to [facility]. They were brought to the operating room on 12/22/2014 and underwent Procedure(s): CENTRAL DECOMPRESSIVE LUMBAR, AND LAMINECTOMY L5-S1,  S1-S2 FOR SPINAL STENOSIS FORAMINOTOMY L5, S1, S2 ROOTS ON LEFT FOR FORAMINAL STENOSIS, AND DECOMPRESSION L5-S1, S1-S2 ACCORDING TO LABELING OF [place].  Patient tolerated the procedure well and was later transferred to the recovery room and then to the orthopaedic floor for postoperative care.  They were given PO and IV analgesics for pain control following their surgery.  They were given 24 hours of postoperative antibiotics of  Anti-infectives    Start     Dose/Rate Route Frequency Ordered Stop   12/22/14 2000  ceFAZolin (ANCEF) IVPB 1 g/50 mL premix     1 g 100 mL/hr over 30 Minutes Intravenous 3 times per day 12/22/14 1656 12/23/14 1328   12/22/14 1352  polymyxin B 500,000 Units, bacitracin 50,000 Units in sodium chloride irrigation 0.9 % 500 mL irrigation  Status:  Discontinued       As needed 12/22/14 1352 12/22/14 1519   12/22/14 1129  ceFAZolin (ANCEF) IVPB 2 g/50 mL premix     2 g 100 mL/hr over 30 Minutes Intravenous On call to O.R. 12/22/14 1129 12/22/14 1306     and started on DVT prophylaxis in the form of Aspirin.   PT and OT were ordered.  Discharge planning consulted to help with postop disposition and equipment needs.  Patient had a fair night on the evening of surgery.  They started to get up OOB with therapy on day one. Continued to work with therapy into day two.   Dressing was changed on day two and the incision was clean and dry. Incision was healing well.  Patient was seen in rounds and was ready to go home.   Diet: Diabetic diet Activity:WBAT Follow-up:in 2 weeks Disposition - Skilled nursing facility Discharged Condition: stable   Discharge Instructions    Call MD / Call 911    Complete by:  As directed   If you experience chest pain or shortness of breath, CALL 911 and be transported to the hospital emergency room.  If you develope a fever above 101 F, pus (white drainage) or increased drainage or redness at the wound, or calf pain, call your surgeon's office.     Constipation Prevention    Complete by:  As directed   Drink plenty of fluids.  Prune juice may be helpful.  You may use a stool softener, such as Colace (over the counter) 100 mg twice a day.  Use MiraLax (over the counter) for constipation as needed.     Diet Carb Modified    Complete by:  As directed      Discharge instructions    Complete by:  As directed   Change your dressing daily. Shower only, no tub bath. For the first few days, remove your dressing, tape a piece of saran wrap over your incision. Take your shower, then remove the saran wrap and put a clean dressing  on. After three days you can shower without the saran wrap.  Call the office for an appointment to see Dr. Gladstone Lighter in two weeks: [phone] and ask for Dr. Charlestine Night nurse, Brunilda Payor.  Call if any temperatures greater than 101 or any wound complications: [phone] during the day and ask for Dr. Charlestine Night nurse, Brunilda Payor.     Increase activity slowly as tolerated    Complete by:  As directed             Medication List    STOP taking these medications        Insulin Syringe-Needle U-100 25G X 1" 1 ML Misc     rosuvastatin 10 MG tablet  Commonly known as:  CRESTOR      TAKE these medications        allopurinol 300 MG tablet  Commonly known as:  ZYLOPRIM  Take 300 mg by mouth every  morning.     aspirin 81 MG EC tablet  Take 1 tablet (81 mg total) by mouth daily.     Biotin 5000 MCG Caps  Take 1 capsule by mouth every morning.     carvedilol 3.125 MG tablet  Commonly known as:  COREG  Take 1 tablet (3.125 mg total) by mouth 2 (two) times daily with a meal.     clopidogrel 75 MG tablet  Commonly known as:  PLAVIX  Take 1 tablet (75 mg total) by mouth daily.     colchicine 0.6 MG tablet  Take 0.6 mg by mouth daily as needed (gout flare up.).     famotidine 10 MG tablet  Commonly known as:  PEPCID AC  Take 1 tablet (10 mg total) by mouth 2 (two) times daily.     furosemide 20 MG tablet  Commonly known as:  LASIX  Take 20 mg by mouth every morning.     insulin NPH Human 100 UNIT/ML injection  Commonly known as:  HUMULIN N  Inject 0.35 mLs (35 Units total) into the skin every morning.     isosorbide mononitrate 30 MG 24 hr tablet  Commonly known as:  IMDUR  Take 1 tablet (30 mg total) by mouth 2 (two) times daily.     levothyroxine 50 MCG tablet  Commonly known as:  SYNTHROID, LEVOTHROID  Take 50 mcg by mouth every morning.     lisinopril 2.5 MG tablet  Commonly known as:  PRINIVIL,ZESTRIL  Take 1 tablet (2.5 mg total) by mouth daily.     multivitamin tablet  Take 1 tablet by mouth daily.     nitroGLYCERIN 0.4 MG SL tablet  Commonly known as:  NITROSTAT  Place 0.4 mg under the tongue every 5 (five) minutes as needed for chest pain.     oxyCODONE-acetaminophen 5-325 MG per tablet  Commonly known as:  PERCOCET/ROXICET  Take 1-2 tablets by mouth every 4 (four) hours as needed for moderate pain.     pantoprazole 40 MG tablet  Commonly known as:  PROTONIX  Take 40 mg by mouth every morning.     PROAIR HFA 108 (90 BASE) MCG/ACT inhaler  Generic drug:  albuterol  Inhale 1-2 puffs into the lungs every 4 (four) hours as needed for wheezing or shortness of breath. For breathing     tiZANidine 4 MG tablet  Commonly known as:  ZANAFLEX  Take 1  tablet (4 mg total) by mouth every 8 (eight) hours as needed.           Follow-up Information  Follow up with GIOFFRE,RONALD A, MD. Schedule an appointment as soon as possible for a visit in 2 weeks.   Specialty:  Orthopedic Surgery   Contact information:   [address] [place] [address] [phone]       Signed: Ardeen Jourdain, PA-C Orthopaedic Surgery 12/24/2014, 7:36 AM

## 2014-12-24 NOTE — Progress Notes (Signed)
RN called report to [employer]. All questions answered.   Paperwork and prescriptions given to patient before discharge. Patient and family instructed to give packet of information to facility upon arrival.

## 2014-12-24 NOTE — Discharge Instructions (Signed)
Change your dressing daily. Shower only, no tub bath. For the first few days, remove your dressing, tape a piece of saran wrap over your incision. Take your shower, then remove the saran wrap and put a clean dressing on. After three days you can shower without the saran wrap.  Call the office for an appointment to see Dr. Gladstone Lighter in two weeks: [phone] and ask for Dr. Charlestine Night nurse, Brunilda Payor.  Call if any temperatures greater than 101 or any wound complications: [phone] during the day and ask for Dr. Charlestine Night nurse, Brunilda Payor.

## 2014-12-24 NOTE — Clinical Social Work Placement (Signed)
   CLINICAL SOCIAL WORK PLACEMENT  NOTE  Date:  12/24/2014  Patient Details  Name: [name] MRN: [id number] Date of Birth: 11-Jun-1936  Clinical Social Work is seeking post-discharge placement for this patient at the [place] level of care (*CSW will initial, date and re-position this form in  chart as items are completed):  No   Patient/family provided with [facility] Work Department's list of facilities offering this level of care within the geographic area requested by the patient (or if unable, by the patient's family).  Yes   Patient/family informed of their freedom to choose among providers that offer the needed level of care, that participate in Medicare, Medicaid or managed care program needed by the patient, have an available bed and are willing to accept the patient.  No   Patient/family informed of [facility]'s ownership interest in [facility] and [facility], as well as of the fact that they are under no obligation to receive care at these facilities.  PASRR submitted to EDS on 12/23/14     PASRR number received on 12/23/14     Existing PASRR number confirmed on       FL2 transmitted to all facilities in geographic area requested by pt/family on 12/23/14     FL2 transmitted to all facilities within larger geographic area on       Patient informed that his/her managed care company has contracts with or will negotiate with certain facilities, including the following:        Yes   Patient/family informed of bed offers received.  Patient chooses bed at [facility]     Physician recommends and patient chooses bed at      Patient to be transferred to [facility] on 12/24/14.  Patient to be transferred to facility by [facility]     Patient family notified on 12/24/14 of transfer.  Name of family member notified:  DAUGHTER     PHYSICIAN       Additional Comment: Pt / family are in agreement with d/c to SNF today.  Pt able to transport by car. NSG reviewed d/c summary, scripts, avs. Scripts included in d/c packet. D/C packet provided to pt prior to d/c.   _______________________________________________ Luretha Rued, LCSW 12/24/2014, 12:53 PM

## 2014-12-27 DIAGNOSIS — G8918 Other acute postprocedural pain: Secondary | ICD-10-CM | POA: Diagnosis not present

## 2014-12-27 DIAGNOSIS — I2510 Atherosclerotic heart disease of native coronary artery without angina pectoris: Secondary | ICD-10-CM | POA: Diagnosis not present

## 2014-12-27 DIAGNOSIS — R262 Difficulty in walking, not elsewhere classified: Secondary | ICD-10-CM | POA: Diagnosis not present

## 2014-12-27 DIAGNOSIS — D649 Anemia, unspecified: Secondary | ICD-10-CM | POA: Diagnosis not present

## 2015-01-03 DIAGNOSIS — E119 Type 2 diabetes mellitus without complications: Secondary | ICD-10-CM | POA: Diagnosis not present

## 2015-01-03 DIAGNOSIS — I10 Essential (primary) hypertension: Secondary | ICD-10-CM | POA: Diagnosis not present

## 2015-01-03 DIAGNOSIS — M109 Gout, unspecified: Secondary | ICD-10-CM | POA: Diagnosis not present

## 2015-01-03 DIAGNOSIS — I2510 Atherosclerotic heart disease of native coronary artery without angina pectoris: Secondary | ICD-10-CM | POA: Diagnosis not present

## 2015-01-03 DIAGNOSIS — Z4789 Encounter for other orthopedic aftercare: Secondary | ICD-10-CM | POA: Diagnosis not present

## 2015-01-07 ENCOUNTER — Encounter: Payer: Medicare Other | Admitting: Vascular Surgery

## 2015-01-07 ENCOUNTER — Encounter (HOSPITAL_COMMUNITY): Payer: Medicare Other

## 2015-01-09 DIAGNOSIS — Z780 Asymptomatic menopausal state: Secondary | ICD-10-CM | POA: Diagnosis not present

## 2015-01-09 DIAGNOSIS — Z13820 Encounter for screening for osteoporosis: Secondary | ICD-10-CM | POA: Diagnosis not present

## 2015-01-13 DIAGNOSIS — E1129 Type 2 diabetes mellitus with other diabetic kidney complication: Secondary | ICD-10-CM | POA: Diagnosis not present

## 2015-01-13 DIAGNOSIS — Z6833 Body mass index (BMI) 33.0-33.9, adult: Secondary | ICD-10-CM | POA: Diagnosis not present

## 2015-01-13 DIAGNOSIS — M4806 Spinal stenosis, lumbar region: Secondary | ICD-10-CM | POA: Diagnosis not present

## 2015-01-27 DIAGNOSIS — H524 Presbyopia: Secondary | ICD-10-CM | POA: Diagnosis not present

## 2015-01-27 DIAGNOSIS — E11339 Type 2 diabetes mellitus with moderate nonproliferative diabetic retinopathy without macular edema: Secondary | ICD-10-CM | POA: Diagnosis not present

## 2015-01-31 DIAGNOSIS — E1129 Type 2 diabetes mellitus with other diabetic kidney complication: Secondary | ICD-10-CM | POA: Diagnosis not present

## 2015-01-31 DIAGNOSIS — Z1389 Encounter for screening for other disorder: Secondary | ICD-10-CM | POA: Diagnosis not present

## 2015-01-31 DIAGNOSIS — I10 Essential (primary) hypertension: Secondary | ICD-10-CM | POA: Diagnosis not present

## 2015-01-31 DIAGNOSIS — N183 Chronic kidney disease, stage 3 (moderate): Secondary | ICD-10-CM | POA: Diagnosis not present

## 2015-01-31 DIAGNOSIS — Z6832 Body mass index (BMI) 32.0-32.9, adult: Secondary | ICD-10-CM | POA: Diagnosis not present

## 2015-01-31 DIAGNOSIS — Z0000 Encounter for general adult medical examination without abnormal findings: Secondary | ICD-10-CM | POA: Diagnosis not present

## 2015-01-31 DIAGNOSIS — E785 Hyperlipidemia, unspecified: Secondary | ICD-10-CM | POA: Diagnosis not present

## 2015-01-31 DIAGNOSIS — I5022 Chronic systolic (congestive) heart failure: Secondary | ICD-10-CM | POA: Diagnosis not present

## 2015-02-18 DIAGNOSIS — E119 Type 2 diabetes mellitus without complications: Secondary | ICD-10-CM | POA: Diagnosis not present

## 2015-02-18 DIAGNOSIS — Z4789 Encounter for other orthopedic aftercare: Secondary | ICD-10-CM | POA: Diagnosis not present

## 2015-02-21 ENCOUNTER — Ambulatory Visit (INDEPENDENT_AMBULATORY_CARE_PROVIDER_SITE_OTHER): Payer: Medicare Other | Admitting: Cardiovascular Disease

## 2015-02-21 ENCOUNTER — Encounter: Payer: Self-pay | Admitting: Cardiovascular Disease

## 2015-02-21 VITALS — BP 100/52 | HR 73 | Ht 67.5 in | Wt 210.8 lb

## 2015-02-21 DIAGNOSIS — E785 Hyperlipidemia, unspecified: Secondary | ICD-10-CM | POA: Diagnosis not present

## 2015-02-21 DIAGNOSIS — I2510 Atherosclerotic heart disease of native coronary artery without angina pectoris: Secondary | ICD-10-CM | POA: Diagnosis not present

## 2015-02-21 DIAGNOSIS — I5022 Chronic systolic (congestive) heart failure: Secondary | ICD-10-CM | POA: Diagnosis not present

## 2015-02-21 DIAGNOSIS — I10 Essential (primary) hypertension: Secondary | ICD-10-CM | POA: Diagnosis not present

## 2015-02-21 NOTE — Patient Instructions (Signed)
Medication Instructions:  Your physician recommends that you continue on your current medications as directed. Please refer to the Current Medication list given to you today.   Labwork: none  Testing/Procedures: Your physician has requested that you have an echocardiogram. Echocardiography is a painless test that uses sound waves to create images of your heart. It provides your doctor with information about the size and shape of your heart and how well your heart's chambers and valves are working. This procedure takes approximately one hour. There are no restrictions for this procedure.    Follow-Up: Your physician wants you to follow-up in: 6 months.  You will receive a reminder letter in the mail two months in advance. If you don't receive a letter, please call our office to schedule the follow-up appointment.    Any Other Special Instructions Will Be Listed Below (If Applicable).

## 2015-02-21 NOTE — Progress Notes (Signed)
Chief Complaint  Patient presents with  . Weight Gain     History of Present Illness: 79 yo female with history of CAD, DM, HTN, asthma, hyperthyroidism, HLD who is here today for hospital follow up. I saw her June 2014 as a new patient for evaluation of chest pain. She had a normal stress test and echo in June 2014. She was admitted to [facility] 01/22/14 with NSTEMI. She underwent cardiac cath per Dr. Tamala Julian and he performed balloon angioplasty only of a moderate caliber, tortuous OM2 branch. Severe disease in small PDA was managed medically. LVEF was 25-30% by echo. She was in heart failure and was diuresed. She was readmitted 02/01/14 with weakness but no chest pain. Readmitted 02/15/14 with chest pain.  Troponin was negative but stress myoview with lateral wall defect c/w ischemia. Cardiac cath 02/17/14 and I placed 2 overlapping DES in the second OM branch. I elected to continue to treat the PDA branch medically given small caliber of vessel. She was readmitted 02/24/14 with recurrent chest pain and mildly elevated troponin. Cardiac cath 02/24/14 with patent stents OM2, severe disease PDA. I performed balloon angioplasty on the PDA branch. The vessel was too small for a stent. Also noted that the OM2 branch appears to have a bridging segment with systole just before the stent. LVEF=50-55% by LV gram. She broke with her arm in February 2016. She underwent back surgery June 2016 and has had continued sciatica type pain.   She is here today for follow up.  No chest pain or SOB. Some LE edema, resolves at night.     Primary Care Physician: Reinaldo Meeker  Fasting lipid profile:  Followed in primary care  Past Medical History  Diagnosis Date  . Hypertension   . Asthma   . Hyperlipidemia     a. H/o intolerance to Zocor, Lipitor. Had muscle aches on higher dose Crestor.  . Gout, unspecified   . CAD S/P percutaneous coronary angioplasty 01/22/2014    A. (12/2013): POBA - OM2 99% (very tortuous segment) -  reduced ~ 50% & TIMI 3 flow, 80-90% stenosis in mid PDA, Irregularities <50% in mRCA, pLAD and prox LCx; b. 02/17/14: Moderate sized fixed defect in Lateral wall --> cath with OM2 restenosis & otherwise stable --> Xience Alpine DES x 2 (2.25 mm x 12 & 8 mm). c. NSTEMI 02/2014 s/p PTCA/balloon angioplasty only to small caliber PDA.  Marland Kitchen Chronic systolic heart failure     a. ICM b. ECHO (01/2014): EF 25-30%, grade I DD, trivial MR. c. EF by Myoview 02/17/14 = ~53%. d. EF by cath 02/24/14 - improved to 50-55%.  . Environmental allergies   . Type II diabetes mellitus   . GERD (gastroesophageal reflux disease)   . TIA (transient ischemic attack) 1990's    a. TIA vs stroke 1990's "mild" - sounds like a TIA as she was told she had stroke symptoms but negative scans. Denies residual effects.  . Contrast media allergy   . QT prolongation     a. Noted on EKG 02/2014.  Marland Kitchen Anemia     a. Noted on labs 02/2014 possibly procedurally related.  Marland Kitchen LBBB (left bundle branch block)     a. Transient during 02/2014 admission.  . CKD (chronic kidney disease), stage III   . Sinus bradycardia     a. HR 40s-50s during 02/2014 admission, limiting BB dose.  . NSTEMI (non-ST elevated myocardial infarction) 2014; 12/2013, 01/2014  . Hypothyroidism     Past Surgical  History  Procedure Laterality Date  . Shoulder open rotator cuff repair Bilateral 1980's - 1990's  . Total knee arthroplasty Right 2009  . Cesarean section  [id number]; 1960  . Appendectomy  1959  . Cholecystectomy  1989  . Reduction mammaplasty Bilateral 1990's  . Cataract extraction w/ intraocular lens  implant, bilateral Bilateral 2013  . Coronary angioplasty  12/2013    POBA - OM2  . Percutaneous coronary stent intervention (pci-s)  02/17/14    For restenosis of OM2 - PCI with Xience Apline DES 2.25 mm x 12 mm & 2.25 mm x 8 mm overlapping  . Left heart catheterization with coronary angiogram N/A 01/22/2014    Procedure: LEFT HEART CATHETERIZATION WITH CORONARY  ANGIOGRAM;  Surgeon: Sinclair Grooms, MD;  Location: [facility] CATH LAB;  Service: Cardiovascular;  Laterality: N/A;  . Left heart catheterization with coronary angiogram N/A 02/17/2014    Procedure: LEFT HEART CATHETERIZATION WITH CORONARY ANGIOGRAM;  Surgeon: Burnell Blanks, MD;  Location: [facility] CATH LAB;  Service: Cardiovascular;  Laterality: N/A;  . Left heart catheterization with coronary angiogram N/A 02/24/2014    Procedure: LEFT HEART CATHETERIZATION WITH CORONARY ANGIOGRAM;  Surgeon: Burnell Blanks, MD;  Location: [facility] CATH LAB;  Service: Cardiovascular;  Laterality: N/A;  . Cardiac catheterization  02/24/2014    Procedure: CORONARY BALLOON ANGIOPLASTY;  Surgeon: Burnell Blanks, MD;  Location: [facility] CATH LAB;  Service: Cardiovascular;;  . Abdominal hysterectomy      complete  . Joint replacement      right knee  . Lumbar laminectomy/decompression microdiscectomy Left 12/22/2014    Procedure: CENTRAL DECOMPRESSIVE LUMBAR, AND LAMINECTOMY L5-S1,  S1-S2 FOR SPINAL STENOSIS FORAMINOTOMY L5, S1, S2 ROOTS ON LEFT FOR FORAMINAL STENOSIS, AND DECOMPRESSION L5-S1, S1-S2 ACCORDING TO LABELING OF [place] X-RAYS;  Surgeon: Latanya Maudlin, MD;  Location: [facility] ORS;  Service: Orthopedics;  Laterality: Left;    Current Outpatient Prescriptions  Medication Sig Dispense Refill  . allopurinol (ZYLOPRIM) 300 MG tablet Take 300 mg by mouth every morning.     Marland Kitchen aspirin EC 81 MG EC tablet Take 1 tablet (81 mg total) by mouth daily. 30 tablet 3  . Biotin 5000 MCG CAPS Take 1 capsule by mouth every morning.     . carvedilol (COREG) 3.125 MG tablet Take 1 tablet (3.125 mg total) by mouth 2 (two) times daily with a meal. 60 tablet 11  . clopidogrel (PLAVIX) 75 MG tablet Take 1 tablet (75 mg total) by mouth daily. (Patient taking differently: Take 75 mg by mouth every morning. ) 30 tablet 11  . colchicine 0.6 MG tablet Take 0.6 mg by mouth daily as needed (gout flare up.).     Marland Kitchen famotidine  (PEPCID AC) 10 MG tablet Take 1 tablet (10 mg total) by mouth 2 (two) times daily. (Patient taking differently: Take 10 mg by mouth 2 (two) times daily as needed for heartburn or indigestion. ) 60 tablet 6  . furosemide (LASIX) 20 MG tablet Take 20 mg by mouth every morning.    . insulin NPH Human (HUMULIN N) 100 UNIT/ML injection Inject 0.35 mLs (35 Units total) into the skin every morning. 20 mL 11  . isosorbide mononitrate (IMDUR) 30 MG 24 hr tablet Take 1 tablet (30 mg total) by mouth 2 (two) times daily. 60 tablet 6  . levothyroxine (SYNTHROID, LEVOTHROID) 50 MCG tablet Take 50 mcg by mouth every morning.     Marland Kitchen lisinopril (PRINIVIL,ZESTRIL) 2.5 MG tablet Take 1 tablet (2.5  mg total) by mouth daily. (Patient taking differently: Take 2.5 mg by mouth every morning. ) 30 tablet 0  . Multiple Vitamin (MULTIVITAMIN) tablet Take 1 tablet by mouth daily.    . nitroGLYCERIN (NITROSTAT) 0.4 MG SL tablet Place 0.4 mg under the tongue every 5 (five) minutes as needed for chest pain.    Marland Kitchen oxyCODONE-acetaminophen (PERCOCET/ROXICET) 5-325 MG per tablet Take 1-2 tablets by mouth every 4 (four) hours as needed for moderate pain. 80 tablet 0  . pantoprazole (PROTONIX) 40 MG tablet Take 40 mg by mouth every morning.     Marland Kitchen PROAIR HFA 108 (90 BASE) MCG/ACT inhaler Inhale 1-2 puffs into the lungs every 4 (four) hours as needed for wheezing or shortness of breath. For breathing    . tiZANidine (ZANAFLEX) 4 MG tablet Take 1 tablet (4 mg total) by mouth every 8 (eight) hours as needed. 40 tablet 1   No current facility-administered medications for this visit.    Allergies  Allergen Reactions  . Contrast Media [Iodinated Diagnostic Agents] Other (See Comments)    unknown  . Cortizone-10 [Hydrocortisone] Other (See Comments)    unknown  . Lipitor [Atorvastatin]     Muscle aches- severe  . Zocor [Simvastatin]     Muscle aches- severe    Social History   Social History  . Marital Status: Widowed    Spouse  Name: N/A  . Number of Children: 2  . Years of Education: N/A   Occupational History  . Retired    Social History Main Topics  . Smoking status: Never Smoker   . Smokeless tobacco: Never Used  . Alcohol Use: No  . Drug Use: No  . Sexual Activity: No   Other Topics Concern  . Not on file   Social History Narrative   Lives in [place] by herself.     Family History  Problem Relation Age of Onset  . Hypertension Mother   . Diabetes Mother   . Heart attack Mother 21  . Heart disease Father   . Diabetes Brother   . Heart disease Brother   . Cancer Sister   . Emphysema Father   . Stroke Neg Hx     Review of Systems:  As stated in the HPI and otherwise negative.   BP 100/52 mmHg  Pulse 73  Ht 5' 7.5" (1.715 m)  Wt 210 lb 12.8 oz (95.618 kg)  BMI 32.51 kg/m2  SpO2 99%  Physical Examination: General: Well developed, well nourished, NAD HEENT: OP clear, mucus membranes moist SKIN: warm, dry. No rashes. Neuro: No focal deficits Musculoskeletal: Muscle strength 5/5 all ext Psychiatric: Mood and affect normal Neck: No JVD, no carotid bruits, no thyromegaly, no lymphadenopathy. Lungs:Clear bilaterally, no wheezes, rhonci, crackles Cardiovascular: Regular rate and rhythm. No murmurs, gallops or rubs. Abdomen:Soft. Bowel sounds present. Non-tender.  Extremities: No lower extremity edema. Pulses are 2 + in the bilateral DP/PT.  Cardiac cath 02/24/14:  Left main: 20% mid stenosis.  Left Anterior Descending Artery: Large caliber vessel that courses to the apex. The proximal vessel has a 40-50% stenosis. The mid vessel has a focal 30% stenosis. The diagonal branch is small caliber with diffuse mild plaque.  Circumflex Artery: Moderate caliber vessel with a very small caliber intermediate branch, a very small caliber first OM branch and a moderate calliber second obtuse marginal branch. The proximal Circumflex has a focal 30% stenosis. The intermediate branch is very small in  caliber with proximal 50% stenosis. The second obtuse  marginal branch has patent stents with no restenosis. There appears to be bridging in the proximal OM2 just before the stent, likely as a result of straightening of the vessel with the stents. The mid AV groove Circumflex has a 40% stenosis beyond the takeoff of the second OM branch.  Right Coronary Artery: Large caliber dominant vessel with small caliber PDA. The proximal vessel has a 30% stenosis. The mid vessel has diffuse 40% stenosis. The distal vessel tapers to a small caliber vessel. The PDA is a 1.5 mm vessel. There is a 99% stenosis in the mid PDA.  Left Ventricular Angiogram: LVEF=50-55%.  Impression:  1. Double vessel CAD with patent stents OM2, severe stenosis small caliber PDA  2. Preserved LV systolic function  3. Successful PTCA/balloon angioplasty PDA (no stent placed due to small vessel size)  4. Bridging/kinking of vessel in the tortuous OM2 just before the stented segment  EKG:  EKG is ordered today. The ekg ordered today demonstrates NSR, rate 70 bpm.   Recent Labs: 02/24/2014: Magnesium 2.1 12/20/2014: ALT 13*; BUN 44*; Creatinine, Ser 1.19*; Hemoglobin 10.9*; Platelets 263; Potassium 4.6; Sodium 138   Lipid Panel    Component Value Date/Time   CHOL 176 07/26/2014 1115   TRIG 195.0* 07/26/2014 1115   HDL 42.90 07/26/2014 1115   CHOLHDL 4 07/26/2014 1115   VLDL 39.0 07/26/2014 1115   LDLCALC 94 07/26/2014 1115     Wt Readings from Last 3 Encounters:  02/21/15 210 lb 12.8 oz (95.618 kg)  12/22/14 207 lb 12.8 oz (94.257 kg)  12/20/14 207 lb 12.8 oz (94.257 kg)     Other studies Reviewed: Additional studies/ records that were reviewed today include: Labs from primary care including lipids Review of the above records demonstrates:    Assessment and Plan:   1. CAD:  Stable. No recent chest pains. She will continue ASA, Imdur, Plavix, beta blocker, statin, Ace-inh.   2. HTN: BP well controlled. No changes  3.  DM with vascular complications (CAD): Followed in primary care.   4. HLD: Continue statin. Lipids well controlled in primary care per pt.   5. Chronic systolic CHF: Volume status stable on Lasix 20 mg daily. Elevate feet when sitting. LV function is now normal.   6. Cardiomyopathy: LVEF 50-55% by cath 2015 in September but had been 35% during her stay with MI in July 2015. Will repeat echo now to reassess LvEF.   Current medicines are reviewed at length with the patient today.  The patient does not have concerns regarding medicines.  The following changes have been made:  Increase Imdur and Lasix  Labs/ tests ordered today include:   Orders Placed This Encounter  Procedures  . Echocardiogram    Disposition:   FU with me in 6 months  Signed, Lauree Chandler, MD 02/21/2015 4:09 PM    [facility] [address], [place], [place]  [address] Phone: [phone]; Fax: [phone]

## 2015-02-25 ENCOUNTER — Encounter: Payer: Self-pay | Admitting: Endocrinology

## 2015-02-25 ENCOUNTER — Ambulatory Visit (INDEPENDENT_AMBULATORY_CARE_PROVIDER_SITE_OTHER): Payer: Medicare Other | Admitting: Endocrinology

## 2015-02-25 VITALS — BP 136/64 | HR 75 | Temp 97.8°F | Ht 67.0 in | Wt 212.0 lb

## 2015-02-25 DIAGNOSIS — E1122 Type 2 diabetes mellitus with diabetic chronic kidney disease: Secondary | ICD-10-CM | POA: Diagnosis not present

## 2015-02-25 DIAGNOSIS — N189 Chronic kidney disease, unspecified: Secondary | ICD-10-CM

## 2015-02-25 MED ORDER — INSULIN NPH ISOPHANE & REGULAR (70-30) 100 UNIT/ML ~~LOC~~ SUSP: 32.0000 [IU] | Freq: Every day | SUBCUTANEOUS | Status: DC

## 2015-02-25 NOTE — Patient Instructions (Addendum)
check your blood sugar twice a day.  vary the time of day when you check, between before the 3 meals, and at bedtime.  also check if you have symptoms of your blood sugar being too high or too low.  please keep a record of the readings and bring it to your next appointment here.  You can write it on any piece of paper.  please call us sooner if your blood sugar goes below 70, or if you have a lot of readings over 200. Please come back for a follow-up appointment in 3 months.  Please change the insulin to "70/30," 32 units with the first meal of the day.  However, if you are going to be active, take just 25 units.

## 2015-02-25 NOTE — Progress Notes (Signed)
Subjective:    Patient ID: Carol [name], female    DOB: [birth year], 79 y.o.   MRN: [id number]  HPI Pt returns for f/u of diabetes mellitus: DM type: Insulin-requiring type 2 Dx'ed: [id number] Complications: polyneuropathy, renal failure, retinopathy, and CAD. Therapy: insulin since MI in mid-2015.  GDM: never.   DKA: never Severe hypoglycemia: never Pancreatitis: never Other: she had hyperglycemia hyperosmolar state with one of her hospitalizations in 2015; She wants to take insulin only qd; she takes human insulin, due to cost.   Interval history: no cbg record, but states cbg's vary from 47-200.  It is in general higher as the day goes on, but is is lower with activity.  Past Medical History  Diagnosis Date  . Hypertension   . Asthma   . Hyperlipidemia     a. H/o intolerance to Zocor, Lipitor. Had muscle aches on higher dose Crestor.  . Gout, unspecified   . CAD S/P percutaneous coronary angioplasty 01/22/2014    A. (12/2013): POBA - OM2 99% (very tortuous segment) - reduced ~ 50% & TIMI 3 flow, 80-90% stenosis in mid PDA, Irregularities <50% in mRCA, pLAD and prox LCx; b. 02/17/14: Moderate sized fixed defect in Lateral wall --> cath with OM2 restenosis & otherwise stable --> Xience Alpine DES x 2 (2.25 mm x 12 & 8 mm). c. NSTEMI 02/2014 s/p PTCA/balloon angioplasty only to small caliber PDA.  Marland Kitchen Chronic systolic heart failure     a. ICM b. ECHO (01/2014): EF 25-30%, grade I DD, trivial MR. c. EF by Myoview 02/17/14 = ~53%. d. EF by cath 02/24/14 - improved to 50-55%.  . Environmental allergies   . Type II diabetes mellitus   . GERD (gastroesophageal reflux disease)   . TIA (transient ischemic attack) 1990's    a. TIA vs stroke 1990's "mild" - sounds like a TIA as she was told she had stroke symptoms but negative scans. Denies residual effects.  . Contrast media allergy   . QT prolongation     a. Noted on EKG 02/2014.  Marland Kitchen Anemia     a. Noted on labs 02/2014 possibly procedurally related.    Marland Kitchen LBBB (left bundle branch block)     a. Transient during 02/2014 admission.  . CKD (chronic kidney disease), stage III   . Sinus bradycardia     a. HR 40s-50s during 02/2014 admission, limiting BB dose.  . NSTEMI (non-ST elevated myocardial infarction) 2014; 12/2013, 01/2014  . Hypothyroidism     Past Surgical History  Procedure Laterality Date  . Shoulder open rotator cuff repair Bilateral 1980's - 1990's  . Total knee arthroplasty Right 2009  . Cesarean section  [id number]; 1960  . Appendectomy  1959  . Cholecystectomy  1989  . Reduction mammaplasty Bilateral 1990's  . Cataract extraction w/ intraocular lens  implant, bilateral Bilateral 2013  . Coronary angioplasty  12/2013    POBA - OM2  . Percutaneous coronary stent intervention (pci-s)  02/17/14    For restenosis of OM2 - PCI with Xience Apline DES 2.25 mm x 12 mm & 2.25 mm x 8 mm overlapping  . Left heart catheterization with coronary angiogram N/A 01/22/2014    Procedure: LEFT HEART CATHETERIZATION WITH CORONARY ANGIOGRAM;  Surgeon: Sinclair Grooms, MD;  Location: [facility] CATH LAB;  Service: Cardiovascular;  Laterality: N/A;  . Left heart catheterization with coronary angiogram N/A 02/17/2014    Procedure: LEFT HEART CATHETERIZATION WITH CORONARY ANGIOGRAM;  Surgeon: Burnell Blanks, MD;  Location: [facility] CATH LAB;  Service: Cardiovascular;  Laterality: N/A;  . Left heart catheterization with coronary angiogram N/A 02/24/2014    Procedure: LEFT HEART CATHETERIZATION WITH CORONARY ANGIOGRAM;  Surgeon: Burnell Blanks, MD;  Location: [facility] CATH LAB;  Service: Cardiovascular;  Laterality: N/A;  . Cardiac catheterization  02/24/2014    Procedure: CORONARY BALLOON ANGIOPLASTY;  Surgeon: Burnell Blanks, MD;  Location: [facility] CATH LAB;  Service: Cardiovascular;;  . Abdominal hysterectomy      complete  . Joint replacement      right knee  . Lumbar laminectomy/decompression microdiscectomy Left 12/22/2014    Procedure: CENTRAL  DECOMPRESSIVE LUMBAR, AND LAMINECTOMY L5-S1,  S1-S2 FOR SPINAL STENOSIS FORAMINOTOMY L5, S1, S2 ROOTS ON LEFT FOR FORAMINAL STENOSIS, AND DECOMPRESSION L5-S1, S1-S2 ACCORDING TO LABELING OF [place] X-RAYS;  Surgeon: Latanya Maudlin, MD;  Location: [facility] ORS;  Service: Orthopedics;  Laterality: Left;    Social History   Social History  . Marital Status: Widowed    Spouse Name: N/A  . Number of Children: 2  . Years of Education: N/A   Occupational History  . Retired    Social History Main Topics  . Smoking status: Never Smoker   . Smokeless tobacco: Never Used  . Alcohol Use: No  . Drug Use: No  . Sexual Activity: No   Other Topics Concern  . Not on file   Social History Narrative   Lives in [place] by herself.     Current Outpatient Prescriptions on File Prior to Visit  Medication Sig Dispense Refill  . allopurinol (ZYLOPRIM) 300 MG tablet Take 300 mg by mouth every morning.     Marland Kitchen aspirin EC 81 MG EC tablet Take 1 tablet (81 mg total) by mouth daily. 30 tablet 3  . Biotin 5000 MCG CAPS Take 1 capsule by mouth every morning.     . carvedilol (COREG) 3.125 MG tablet Take 1 tablet (3.125 mg total) by mouth 2 (two) times daily with a meal. 60 tablet 11  . clopidogrel (PLAVIX) 75 MG tablet Take 1 tablet (75 mg total) by mouth daily. (Patient taking differently: Take 75 mg by mouth every morning. ) 30 tablet 11  . colchicine 0.6 MG tablet Take 0.6 mg by mouth daily as needed (gout flare up.).     Marland Kitchen famotidine (PEPCID AC) 10 MG tablet Take 1 tablet (10 mg total) by mouth 2 (two) times daily. (Patient taking differently: Take 10 mg by mouth 2 (two) times daily as needed for heartburn or indigestion. ) 60 tablet 6  . furosemide (LASIX) 20 MG tablet Take 20 mg by mouth every morning.    . isosorbide mononitrate (IMDUR) 30 MG 24 hr tablet Take 1 tablet (30 mg total) by mouth 2 (two) times daily. 60 tablet 6  . levothyroxine (SYNTHROID, LEVOTHROID) 50 MCG tablet Take 50 mcg by  mouth every morning.     Marland Kitchen lisinopril (PRINIVIL,ZESTRIL) 2.5 MG tablet Take 1 tablet (2.5 mg total) by mouth daily. (Patient taking differently: Take 2.5 mg by mouth every morning. ) 30 tablet 0  . Multiple Vitamin (MULTIVITAMIN) tablet Take 1 tablet by mouth daily.    . nitroGLYCERIN (NITROSTAT) 0.4 MG SL tablet Place 0.4 mg under the tongue every 5 (five) minutes as needed for chest pain.    Marland Kitchen oxyCODONE-acetaminophen (PERCOCET/ROXICET) 5-325 MG per tablet Take 1-2 tablets by mouth every 4 (four) hours as needed for moderate pain. 80 tablet 0  . pantoprazole (PROTONIX) 40 MG tablet Take  40 mg by mouth every morning.     Marland Kitchen PROAIR HFA 108 (90 BASE) MCG/ACT inhaler Inhale 1-2 puffs into the lungs every 4 (four) hours as needed for wheezing or shortness of breath. For breathing    . tiZANidine (ZANAFLEX) 4 MG tablet Take 1 tablet (4 mg total) by mouth every 8 (eight) hours as needed. 40 tablet 1   No current facility-administered medications on file prior to visit.    Allergies  Allergen Reactions  . Contrast Media [Iodinated Diagnostic Agents] Other (See Comments)    unknown  . Cortizone-10 [Hydrocortisone] Other (See Comments)    unknown  . Lipitor [Atorvastatin]     Muscle aches- severe  . Zocor [Simvastatin]     Muscle aches- severe    Family History  Problem Relation Age of Onset  . Hypertension Mother   . Diabetes Mother   . Heart attack Mother 35  . Heart disease Father   . Diabetes Brother   . Heart disease Brother   . Cancer Sister   . Emphysema Father   . Stroke Neg Hx     BP 136/64 mmHg  Pulse 75  Temp(Src) 97.8 F (36.6 C) (Oral)  Ht 5\' 7"  (1.702 m)  Wt 212 lb (96.163 kg)  BMI 33.20 kg/m2  SpO2 97%   Review of Systems Denies LOC    Objective:   Physical Exam VITAL SIGNS:  See vs page GENERAL: no distress Pulses: dorsalis pedis intact bilat.   MSK: no deformity of the feet  CV: 1+ bilat leg edema, and varicosities. Skin: no ulcer on the feet. normal  color and temp on the feet.  Neuro: sensation is intact to touch on the feet.   Ext: There is bilateral onychomycosis of the toenails.    outside test results are reviewed:  A1c=7.3%  Lab Results  Component Value Date   CREATININE 1.19* 12/20/2014   BUN 44* 12/20/2014   NA 138 12/20/2014   K 4.6 12/20/2014   CL 100* 12/20/2014   CO2 28 12/20/2014      Assessment & Plan:  DM: this is the best control this pt should aim for, given this regimen, which does match insulin to her changing needs throughout the day.   Hypoglycemia: given pt's advanced age, we need to reduce insulin, at least for now.   Renal failure: the pattern of pt's cbg's supports the need for a faster-acting qd insulin.    Patient is advised the following: Patient Instructions  check your blood sugar twice a day.  vary the time of day when you check, between before the 3 meals, and at bedtime.  also check if you have symptoms of your blood sugar being too high or too low.  please keep a record of the readings and bring it to your next appointment here.  You can write it on any piece of paper.  please call us sooner if your blood sugar goes below 70, or if you have a lot of readings over 200. Please come back for a follow-up appointment in 3 months.  Please change the insulin to "70/30," 32 units with the first meal of the day.  However, if you are going to be active, take just 25 units.

## 2015-03-04 ENCOUNTER — Ambulatory Visit (HOSPITAL_COMMUNITY): Payer: Medicare Other | Attending: Cardiovascular Disease

## 2015-03-04 ENCOUNTER — Other Ambulatory Visit: Payer: Self-pay

## 2015-03-04 DIAGNOSIS — I129 Hypertensive chronic kidney disease with stage 1 through stage 4 chronic kidney disease, or unspecified chronic kidney disease: Secondary | ICD-10-CM | POA: Insufficient documentation

## 2015-03-04 DIAGNOSIS — N183 Chronic kidney disease, stage 3 (moderate): Secondary | ICD-10-CM | POA: Diagnosis not present

## 2015-03-04 DIAGNOSIS — E785 Hyperlipidemia, unspecified: Secondary | ICD-10-CM | POA: Insufficient documentation

## 2015-03-04 DIAGNOSIS — E119 Type 2 diabetes mellitus without complications: Secondary | ICD-10-CM | POA: Insufficient documentation

## 2015-03-04 DIAGNOSIS — I2510 Atherosclerotic heart disease of native coronary artery without angina pectoris: Secondary | ICD-10-CM | POA: Diagnosis not present

## 2015-03-08 ENCOUNTER — Telehealth: Payer: Self-pay | Admitting: Cardiovascular Disease

## 2015-03-08 NOTE — Telephone Encounter (Signed)
Informed pt of echo results. Pt verbalized understanding. 

## 2015-03-08 NOTE — Telephone Encounter (Signed)
New message     Pt returning call regarding test results

## 2015-03-10 DIAGNOSIS — M79604 Pain in right leg: Secondary | ICD-10-CM | POA: Diagnosis not present

## 2015-03-10 DIAGNOSIS — Z6834 Body mass index (BMI) 34.0-34.9, adult: Secondary | ICD-10-CM | POA: Diagnosis not present

## 2015-03-10 DIAGNOSIS — R609 Edema, unspecified: Secondary | ICD-10-CM | POA: Diagnosis not present

## 2015-03-10 DIAGNOSIS — I809 Phlebitis and thrombophlebitis of unspecified site: Secondary | ICD-10-CM | POA: Diagnosis not present

## 2015-03-10 DIAGNOSIS — R600 Localized edema: Secondary | ICD-10-CM | POA: Diagnosis not present

## 2015-03-19 ENCOUNTER — Other Ambulatory Visit: Payer: Self-pay | Admitting: Cardiovascular Disease

## 2015-03-25 ENCOUNTER — Other Ambulatory Visit: Payer: Self-pay | Admitting: Cardiovascular Disease

## 2015-04-19 ENCOUNTER — Telehealth: Payer: Self-pay | Admitting: Cardiovascular Disease

## 2015-04-19 ENCOUNTER — Encounter: Payer: Self-pay | Admitting: Physician Assistant

## 2015-04-19 ENCOUNTER — Inpatient Hospital Stay (HOSPITAL_COMMUNITY)
Admission: AD | Admit: 2015-04-19 | Discharge: 2015-04-30 | DRG: 234 | Disposition: A | Discharge status: Skilled Nursing Facility | Payer: Medicare Other | Source: Ambulatory Visit | Attending: Surgery | Admitting: Surgery

## 2015-04-19 ENCOUNTER — Observation Stay (HOSPITAL_COMMUNITY): Payer: Medicare Other

## 2015-04-19 ENCOUNTER — Ambulatory Visit (INDEPENDENT_AMBULATORY_CARE_PROVIDER_SITE_OTHER): Payer: Medicare Other | Admitting: Physician Assistant

## 2015-04-19 VITALS — BP 140/70 | HR 65 | Ht 67.0 in | Wt 218.6 lb

## 2015-04-19 DIAGNOSIS — I252 Old myocardial infarction: Secondary | ICD-10-CM

## 2015-04-19 DIAGNOSIS — I1310 Hypertensive heart and chronic kidney disease without heart failure, with stage 1 through stage 4 chronic kidney disease, or unspecified chronic kidney disease: Secondary | ICD-10-CM

## 2015-04-19 DIAGNOSIS — M109 Gout, unspecified: Secondary | ICD-10-CM | POA: Diagnosis present

## 2015-04-19 DIAGNOSIS — R0989 Other specified symptoms and signs involving the circulatory and respiratory systems: Secondary | ICD-10-CM

## 2015-04-19 DIAGNOSIS — I200 Unstable angina: Secondary | ICD-10-CM | POA: Diagnosis not present

## 2015-04-19 DIAGNOSIS — I237 Postinfarction angina: Secondary | ICD-10-CM

## 2015-04-19 DIAGNOSIS — I25110 Atherosclerotic heart disease of native coronary artery with unstable angina pectoris: Principal | ICD-10-CM | POA: Diagnosis present

## 2015-04-19 DIAGNOSIS — E785 Hyperlipidemia, unspecified: Secondary | ICD-10-CM | POA: Diagnosis present

## 2015-04-19 DIAGNOSIS — Z91041 Radiographic dye allergy status: Secondary | ICD-10-CM | POA: Diagnosis not present

## 2015-04-19 DIAGNOSIS — Z96651 Presence of right artificial knee joint: Secondary | ICD-10-CM | POA: Diagnosis not present

## 2015-04-19 DIAGNOSIS — I5032 Chronic diastolic (congestive) heart failure: Secondary | ICD-10-CM | POA: Diagnosis present

## 2015-04-19 DIAGNOSIS — Z7902 Long term (current) use of antithrombotics/antiplatelets: Secondary | ICD-10-CM | POA: Diagnosis not present

## 2015-04-19 DIAGNOSIS — Z951 Presence of aortocoronary bypass graft: Secondary | ICD-10-CM

## 2015-04-19 DIAGNOSIS — E119 Type 2 diabetes mellitus without complications: Secondary | ICD-10-CM | POA: Diagnosis not present

## 2015-04-19 DIAGNOSIS — I255 Ischemic cardiomyopathy: Secondary | ICD-10-CM | POA: Diagnosis not present

## 2015-04-19 DIAGNOSIS — Z794 Long term (current) use of insulin: Secondary | ICD-10-CM | POA: Diagnosis not present

## 2015-04-19 DIAGNOSIS — K219 Gastro-esophageal reflux disease without esophagitis: Secondary | ICD-10-CM

## 2015-04-19 DIAGNOSIS — R079 Chest pain, unspecified: Secondary | ICD-10-CM

## 2015-04-19 DIAGNOSIS — I10 Essential (primary) hypertension: Secondary | ICD-10-CM | POA: Diagnosis present

## 2015-04-19 DIAGNOSIS — Z981 Arthrodesis status: Secondary | ICD-10-CM | POA: Diagnosis not present

## 2015-04-19 DIAGNOSIS — Z955 Presence of coronary angioplasty implant and graft: Secondary | ICD-10-CM

## 2015-04-19 DIAGNOSIS — J45909 Unspecified asthma, uncomplicated: Secondary | ICD-10-CM | POA: Diagnosis not present

## 2015-04-19 DIAGNOSIS — E039 Hypothyroidism, unspecified: Secondary | ICD-10-CM | POA: Diagnosis present

## 2015-04-19 DIAGNOSIS — D62 Acute posthemorrhagic anemia: Secondary | ICD-10-CM | POA: Diagnosis not present

## 2015-04-19 DIAGNOSIS — I130 Hypertensive heart and chronic kidney disease with heart failure and stage 1 through stage 4 chronic kidney disease, or unspecified chronic kidney disease: Secondary | ICD-10-CM | POA: Diagnosis not present

## 2015-04-19 DIAGNOSIS — Z79899 Other long term (current) drug therapy: Secondary | ICD-10-CM

## 2015-04-19 DIAGNOSIS — E1159 Type 2 diabetes mellitus with other circulatory complications: Secondary | ICD-10-CM

## 2015-04-19 DIAGNOSIS — E1122 Type 2 diabetes mellitus with diabetic chronic kidney disease: Secondary | ICD-10-CM | POA: Diagnosis not present

## 2015-04-19 DIAGNOSIS — I440 Atrioventricular block, first degree: Secondary | ICD-10-CM | POA: Diagnosis not present

## 2015-04-19 DIAGNOSIS — Z8249 Family history of ischemic heart disease and other diseases of the circulatory system: Secondary | ICD-10-CM | POA: Diagnosis not present

## 2015-04-19 DIAGNOSIS — N183 Chronic kidney disease, stage 3 unspecified: Secondary | ICD-10-CM

## 2015-04-19 DIAGNOSIS — Z79891 Long term (current) use of opiate analgesic: Secondary | ICD-10-CM | POA: Diagnosis not present

## 2015-04-19 DIAGNOSIS — Z7982 Long term (current) use of aspirin: Secondary | ICD-10-CM | POA: Diagnosis not present

## 2015-04-19 DIAGNOSIS — R0602 Shortness of breath: Secondary | ICD-10-CM | POA: Diagnosis not present

## 2015-04-19 DIAGNOSIS — I2510 Atherosclerotic heart disease of native coronary artery without angina pectoris: Secondary | ICD-10-CM

## 2015-04-19 DIAGNOSIS — Z9861 Coronary angioplasty status: Secondary | ICD-10-CM

## 2015-04-19 DIAGNOSIS — E11649 Type 2 diabetes mellitus with hypoglycemia without coma: Secondary | ICD-10-CM | POA: Diagnosis not present

## 2015-04-19 DIAGNOSIS — I5042 Chronic combined systolic (congestive) and diastolic (congestive) heart failure: Secondary | ICD-10-CM

## 2015-04-19 DIAGNOSIS — Z888 Allergy status to other drugs, medicaments and biological substances status: Secondary | ICD-10-CM | POA: Diagnosis not present

## 2015-04-19 DIAGNOSIS — R0789 Other chest pain: Secondary | ICD-10-CM

## 2015-04-19 DIAGNOSIS — I25119 Atherosclerotic heart disease of native coronary artery with unspecified angina pectoris: Secondary | ICD-10-CM | POA: Diagnosis not present

## 2015-04-19 DIAGNOSIS — Z01810 Encounter for preprocedural cardiovascular examination: Secondary | ICD-10-CM | POA: Diagnosis not present

## 2015-04-19 LAB — COMPREHENSIVE METABOLIC PANEL
ALBUMIN: 3.9 g/dL (ref 3.5–5.0)
ALT: 15 U/L (ref 14–54)
AST: 19 U/L (ref 15–41)
Alkaline Phosphatase: 114 U/L (ref 38–126)
Anion gap: 6 (ref 5–15)
BILIRUBIN TOTAL: 0.4 mg/dL (ref 0.3–1.2)
BUN: 29 mg/dL — AB (ref 6–20)
CO2: 29 mmol/L (ref 22–32)
CREATININE: 1.17 mg/dL — AB (ref 0.44–1.00)
Calcium: 9.6 mg/dL (ref 8.9–10.3)
Chloride: 106 mmol/L (ref 101–111)
GFR calc Af Amer: 51 mL/min — ABNORMAL LOW (ref >60)
GFR, EST NON AFRICAN AMERICAN: 44 mL/min — AB (ref >60)
GLUCOSE: 81 mg/dL (ref 65–99)
POTASSIUM: 4.7 mmol/L (ref 3.5–5.1)
Sodium: 141 mmol/L (ref 135–145)
TOTAL PROTEIN: 7 g/dL (ref 6.5–8.1)

## 2015-04-19 LAB — GLUCOSE, CAPILLARY
Glucose-Capillary: 115 mg/dL — ABNORMAL HIGH (ref 65–99)
Glucose-Capillary: 65 mg/dL (ref 65–99)

## 2015-04-19 LAB — CBC WITH DIFFERENTIAL/PLATELET
BASOS ABS: 0 10*3/uL (ref 0.0–0.1)
Basophils Relative: 1 %
Eosinophils Absolute: 0.1 10*3/uL (ref 0.0–0.7)
Eosinophils Relative: 2 %
HCT: 33.7 % — ABNORMAL LOW (ref 36.0–46.0)
Hemoglobin: 10.5 g/dL — ABNORMAL LOW (ref 12.0–15.0)
LYMPHS ABS: 2 10*3/uL (ref 0.7–4.0)
LYMPHS PCT: 34 %
MCH: 28.6 pg (ref 26.0–34.0)
MCHC: 31.2 g/dL (ref 30.0–36.0)
MCV: 91.8 fL (ref 78.0–100.0)
MONO ABS: 0.5 10*3/uL (ref 0.1–1.0)
Monocytes Relative: 8 %
NEUTROS ABS: 3.1 10*3/uL (ref 1.7–7.7)
Neutrophils Relative %: 55 %
Platelets: 265 10*3/uL (ref 150–400)
RBC: 3.67 MIL/uL — AB (ref 3.87–5.11)
RDW: 14.6 % (ref 11.5–15.5)
WBC: 5.8 10*3/uL (ref 4.0–10.5)

## 2015-04-19 LAB — TROPONIN I
Troponin I: <0.03 ng/mL (ref <0.031)
Troponin I: <0.03 ng/mL (ref <0.031)

## 2015-04-19 LAB — APTT: APTT: 33 s (ref 24–37)

## 2015-04-19 LAB — TSH: TSH: 7.021 u[IU]/mL — ABNORMAL HIGH (ref 0.350–4.500)

## 2015-04-19 LAB — PROTIME-INR
INR: 1.05 (ref 0.00–1.49)
Prothrombin Time: 13.9 seconds (ref 11.6–15.2)

## 2015-04-19 LAB — BRAIN NATRIURETIC PEPTIDE: B NATRIURETIC PEPTIDE 5: 36.6 pg/mL (ref 0.0–100.0)

## 2015-04-19 MED ORDER — ASPIRIN 81 MG PO CHEW
81.0000 mg | CHEWABLE_TABLET | ORAL | Status: AC
  Administered 2015-04-20: 81 mg via ORAL
  Filled 2015-04-19: qty 1

## 2015-04-19 MED ORDER — NITROGLYCERIN 0.4 MG SL SUBL: 0.4000 mg | SUBLINGUAL_TABLET | SUBLINGUAL | Status: DC | PRN

## 2015-04-19 MED ORDER — PANTOPRAZOLE SODIUM 40 MG PO TBEC
40.0000 mg | DELAYED_RELEASE_TABLET | Freq: Two times a day (BID) | ORAL | Status: DC
  Administered 2015-04-19 – 2015-04-20 (×2): 40 mg via ORAL
  Filled 2015-04-19 (×2): qty 1

## 2015-04-19 MED ORDER — NITROGLYCERIN IN D5W 200-5 MCG/ML-% IV SOLN
5.0000 ug/min | INTRAVENOUS | Status: DC
  Administered 2015-04-19: 5 ug/min via INTRAVENOUS
  Administered 2015-04-20: 15 ug/min via INTRAVENOUS
  Administered 2015-04-22 – 2015-04-23 (×2): 20 ug/min via INTRAVENOUS
  Filled 2015-04-19 (×4): qty 250

## 2015-04-19 MED ORDER — INSULIN ASPART 100 UNIT/ML ~~LOC~~ SOLN
0.0000 [IU] | Freq: Three times a day (TID) | SUBCUTANEOUS | Status: DC
  Administered 2015-04-20 – 2015-04-21 (×2): 3 [IU] via SUBCUTANEOUS
  Administered 2015-04-21: 7 [IU] via SUBCUTANEOUS
  Administered 2015-04-21: 3 [IU] via SUBCUTANEOUS
  Administered 2015-04-22: 7 [IU] via SUBCUTANEOUS
  Administered 2015-04-22: 4 [IU] via SUBCUTANEOUS
  Administered 2015-04-23: 11 [IU] via SUBCUTANEOUS
  Administered 2015-04-23: 4 [IU] via SUBCUTANEOUS
  Administered 2015-04-24: 7 [IU] via SUBCUTANEOUS
  Administered 2015-04-24: 4 [IU] via SUBCUTANEOUS
  Administered 2015-04-25: 7 [IU] via SUBCUTANEOUS

## 2015-04-19 MED ORDER — SODIUM CHLORIDE 0.9 % IV SOLN
250.0000 mL | INTRAVENOUS | Status: DC | PRN
Start: 2015-04-19 — End: 2015-04-20

## 2015-04-19 MED ORDER — SODIUM CHLORIDE 0.9 % IJ SOLN: 3.0000 mL | INTRAMUSCULAR | Status: DC | PRN

## 2015-04-19 MED ORDER — SODIUM CHLORIDE 0.9 % IV SOLN: 250.0000 mL | INTRAVENOUS | Status: DC | PRN

## 2015-04-19 MED ORDER — ONE-DAILY MULTI VITAMINS PO TABS: 1.0000 | ORAL_TABLET | Freq: Every day | ORAL | Status: DC

## 2015-04-19 MED ORDER — LISINOPRIL 2.5 MG PO TABS
2.5000 mg | ORAL_TABLET | Freq: Every morning | ORAL | Status: DC
  Administered 2015-04-20: 2.5 mg via ORAL
  Filled 2015-04-19: qty 1

## 2015-04-19 MED ORDER — FUROSEMIDE 40 MG PO TABS
40.0000 mg | ORAL_TABLET | Freq: Every day | ORAL | Status: DC
  Administered 2015-04-20 – 2015-04-24 (×5): 40 mg via ORAL
  Filled 2015-04-19 (×5): qty 1

## 2015-04-19 MED ORDER — ALLOPURINOL 300 MG PO TABS
300.0000 mg | ORAL_TABLET | Freq: Every morning | ORAL | Status: DC
  Administered 2015-04-20 – 2015-04-24 (×5): 300 mg via ORAL
  Filled 2015-04-19 (×5): qty 1

## 2015-04-19 MED ORDER — INSULIN ASPART PROT & ASPART (70-30 MIX) 100 UNIT/ML ~~LOC~~ SUSP
32.0000 [IU] | Freq: Every day | SUBCUTANEOUS | Status: DC
  Administered 2015-04-21 – 2015-04-24 (×4): 32 [IU] via SUBCUTANEOUS
  Filled 2015-04-19 (×2): qty 10

## 2015-04-19 MED ORDER — ALBUTEROL SULFATE (2.5 MG/3ML) 0.083% IN NEBU: 2.5000 mg | INHALATION_SOLUTION | RESPIRATORY_TRACT | Status: DC | PRN

## 2015-04-19 MED ORDER — TIZANIDINE HCL 4 MG PO TABS: 4.0000 mg | ORAL_TABLET | Freq: Three times a day (TID) | ORAL | Status: DC | PRN

## 2015-04-19 MED ORDER — SODIUM CHLORIDE 0.9 % IJ SOLN: 3.0000 mL | Freq: Two times a day (BID) | INTRAMUSCULAR | Status: DC

## 2015-04-19 MED ORDER — SODIUM CHLORIDE 0.9 % IV SOLN: INTRAVENOUS | Status: DC

## 2015-04-19 MED ORDER — ONDANSETRON HCL 4 MG/2ML IJ SOLN: 4.0000 mg | Freq: Four times a day (QID) | INTRAMUSCULAR | Status: DC | PRN

## 2015-04-19 MED ORDER — ADULT MULTIVITAMIN W/MINERALS CH
1.0000 | ORAL_TABLET | Freq: Every day | ORAL | Status: DC
  Administered 2015-04-20: 1 via ORAL
  Filled 2015-04-19: qty 1

## 2015-04-19 MED ORDER — ASPIRIN EC 81 MG PO TBEC
81.0000 mg | DELAYED_RELEASE_TABLET | Freq: Every day | ORAL | Status: DC
  Administered 2015-04-20 – 2015-04-24 (×5): 81 mg via ORAL
  Filled 2015-04-19 (×5): qty 1

## 2015-04-19 MED ORDER — COLCHICINE 0.6 MG PO TABS: 0.6000 mg | ORAL_TABLET | Freq: Every day | ORAL | Status: DC | PRN

## 2015-04-19 MED ORDER — LEVOTHYROXINE SODIUM 50 MCG PO TABS: 50.0000 ug | ORAL_TABLET | Freq: Every day | ORAL | Status: DC

## 2015-04-19 MED ORDER — FAMOTIDINE 20 MG PO TABS
20.0000 mg | ORAL_TABLET | Freq: Every day | ORAL | Status: DC
  Administered 2015-04-19: 20 mg via ORAL
  Filled 2015-04-19: qty 1

## 2015-04-19 MED ORDER — OXYCODONE-ACETAMINOPHEN 5-325 MG PO TABS: 1.0000 | ORAL_TABLET | ORAL | Status: DC | PRN

## 2015-04-19 MED ORDER — ALBUTEROL SULFATE HFA 108 (90 BASE) MCG/ACT IN AERS
1.0000 | INHALATION_SPRAY | RESPIRATORY_TRACT | Status: DC | PRN
Start: 2015-04-19 — End: 2015-04-19

## 2015-04-19 MED ORDER — CLOPIDOGREL BISULFATE 75 MG PO TABS
75.0000 mg | ORAL_TABLET | Freq: Every day | ORAL | Status: DC
Start: 2015-04-20 — End: 2015-04-20
  Administered 2015-04-20: 75 mg via ORAL
  Filled 2015-04-19: qty 1

## 2015-04-19 MED ORDER — INSULIN ASPART 100 UNIT/ML ~~LOC~~ SOLN
0.0000 [IU] | Freq: Every day | SUBCUTANEOUS | Status: DC
  Administered 2015-04-20 – 2015-04-23 (×3): 2 [IU] via SUBCUTANEOUS

## 2015-04-19 MED ORDER — ACETAMINOPHEN 325 MG PO TABS
650.0000 mg | ORAL_TABLET | ORAL | Status: DC | PRN
  Administered 2015-04-19 – 2015-04-23 (×2): 650 mg via ORAL
  Filled 2015-04-19 (×2): qty 2

## 2015-04-19 MED ORDER — ENOXAPARIN SODIUM 40 MG/0.4ML ~~LOC~~ SOLN
40.0000 mg | SUBCUTANEOUS | Status: DC
  Administered 2015-04-19: 40 mg via SUBCUTANEOUS
  Filled 2015-04-19: qty 0.4

## 2015-04-19 MED ORDER — TRAMADOL HCL 50 MG PO TABS: 50.0000 mg | ORAL_TABLET | Freq: Every day | ORAL | Status: DC | PRN

## 2015-04-19 MED ORDER — CARVEDILOL 3.125 MG PO TABS
3.1250 mg | ORAL_TABLET | Freq: Two times a day (BID) | ORAL | Status: DC
  Administered 2015-04-19 – 2015-04-22 (×6): 3.125 mg via ORAL
  Filled 2015-04-19 (×6): qty 1

## 2015-04-19 NOTE — Progress Notes (Signed)
Admission History and Physical   Date:  04/19/2015   ID:  LADORA MUSELLA, DOB 01-17-1936, MRN [id number]  PCP:  Lillard Anes, MD  Cardiologist:  Dr. Lauree Chandler   Electrophysiologist:  N/a   Chief Complaint  Patient presents with  . Chest Pain  . Palpitations     History of Present Illness: Carol [name] is a 79 y.o. female with a hx of CAD, diabetes, HTN, HL, asthma, hypothyroidism. She was initially seen by Dr. Angelena Form in 6/14 for chest pain. Stress test was normal. She was then admitted 7/15 with non-STEMI. This was treated with balloon angioplasty of a moderate caliber, tortuous OM2 branch. She had severe disease in a small PDA that was managed medically. EF was 25-30% at that time. She was diuresed for acute CHF. Readmitted 8/15 with chest pain. Stress Myoview demonstrated lateral defect consistent with ischemia. [place] 8/15 was performed and she underwent to overlapping DES to the OM2 branch. The PDA was treated medically. She was readmitted 9/15 and LHC demonstrated patent stents in the OM2 and severe disease in the PDA. Balloon angioplasty was performed of the PDA branch. Vessel was too small for a stent. There appeared to be a bridging segment of the OM2 during systole jsut before the stent.  EF was 50-55%. She underwent back surgery 6/16. Last seen by Dr. Angelena Form 8/16. Follow-up echo was performed. This demonstrated normal LV function.  Patient called in today with constant chest pain since Sat and palpitations. She is added on for evaluation.    The patient notes chronic left-sided chest discomfort, orthopnea and increased belching since her first PCI procedure. She typically gets worsening left-sided chest symptoms and belching when she has progressive CAD. Since Saturday, she has noted worsening left-sided chest discomfort. This is described as a pressure. It is constant but clearly worsens with minimal activity. She has associated dyspnea. She notes nausea  as well as diaphoresis. She sleeps in a recliner chronically. This is unchanged. She has had some increasing LE edema. This is usually better in the mornings and worse throughout the day. She denies any significant cough or wheezing. Weight is up about 6 pounds since last visit. She does note occasional flutters/palpitations.    Studies/Reports Reviewed Today:  Echo 03/04/15 EF [id number], grade 1 diastolic dysfunction  LHC 02/24/14 LM: Mid 20% LAD: Proximal 40-50%, mid 30% LCx: Proximal 30%, proximal RI 50% (small caliber), OM2 stents patent with bridging in the proximal OM2 before the stent, mid AV groove 40% RCA: Proximal 30%, mid 40%, mid PDA 99% >> PCI: POBA (no stent secondary to small vessel size)  LHC 02/17/14 OM2 99% >> PCI: 2.25 x 12 mm Xience DES and 2.25 x 8 mm Xience DES -overlapping   Myoview 8/15 IMPRESSION: 1. New LEFT lateral wall infarction. Small area of mildly decreased perfusion in the inferior anterior wall near the apex. 2. Mild hypokinesia associated with LEFT lateral wall infarction. 3. Left ventricular ejection fraction 63%.  4. Intermediate-risk stress test findings*.    Past Medical History  Diagnosis Date  . Hypertension   . Asthma   . Hyperlipidemia     a. H/o intolerance to Zocor, Lipitor. Had muscle aches on higher dose Crestor.  . Gout, unspecified   . CAD S/P percutaneous coronary angioplasty 01/22/2014    A. (12/2013): POBA - OM2 99% (very tortuous segment) - reduced ~ 50% & TIMI 3 flow, 80-90% stenosis in mid PDA, Irregularities <50% in mRCA, pLAD and prox LCx; b.  02/17/14: Moderate sized fixed defect in Lateral wall --> cath with OM2 restenosis & otherwise stable --> Xience Alpine DES x 2 (2.25 mm x 12 & 8 mm). c. NSTEMI 02/2014 s/p PTCA/balloon angioplasty only to small caliber PDA.  Marland Kitchen Chronic systolic heart failure ([place])     a. ICM b. ECHO (01/2014): EF 25-30%, grade I DD, trivial MR. c. EF by Myoview 02/17/14 = ~53%. d. EF by cath 02/24/14 - improved to  50-55%.  . Environmental allergies   . Type II diabetes mellitus ([place])   . GERD (gastroesophageal reflux disease)   . TIA (transient ischemic attack) 1990's    a. TIA vs stroke 1990's "mild" - sounds like a TIA as she was told she had stroke symptoms but negative scans. Denies residual effects.  . Contrast media allergy   . QT prolongation     a. Noted on EKG 02/2014.  Marland Kitchen Anemia     a. Noted on labs 02/2014 possibly procedurally related.  Marland Kitchen LBBB (left bundle branch block)     a. Transient during 02/2014 admission.  . CKD (chronic kidney disease), stage III   . Sinus bradycardia     a. HR 40s-50s during 02/2014 admission, limiting BB dose.  . NSTEMI (non-ST elevated myocardial infarction) ([place]) 2014; 12/2013, 01/2014  . Hypothyroidism     Past Surgical History  Procedure Laterality Date  . Shoulder open rotator cuff repair Bilateral 1980's - 1990's  . Total knee arthroplasty Right 2009  . Cesarean section  [id number]; 1960  . Appendectomy  1959  . Cholecystectomy  1989  . Reduction mammaplasty Bilateral 1990's  . Cataract extraction w/ intraocular lens  implant, bilateral Bilateral 2013  . Coronary angioplasty  12/2013    POBA - OM2  . Percutaneous coronary stent intervention (pci-s)  02/17/14    For restenosis of OM2 - PCI with Xience Apline DES 2.25 mm x 12 mm & 2.25 mm x 8 mm overlapping  . Left heart catheterization with coronary angiogram N/A 01/22/2014    Procedure: LEFT HEART CATHETERIZATION WITH CORONARY ANGIOGRAM;  Surgeon: Sinclair Grooms, MD;  Location: [facility] CATH LAB;  Service: Cardiovascular;  Laterality: N/A;  . Left heart catheterization with coronary angiogram N/A 02/17/2014    Procedure: LEFT HEART CATHETERIZATION WITH CORONARY ANGIOGRAM;  Surgeon: Burnell Blanks, MD;  Location: [facility] CATH LAB;  Service: Cardiovascular;  Laterality: N/A;  . Left heart catheterization with coronary angiogram N/A 02/24/2014    Procedure: LEFT HEART CATHETERIZATION WITH CORONARY ANGIOGRAM;   Surgeon: Burnell Blanks, MD;  Location: [facility] CATH LAB;  Service: Cardiovascular;  Laterality: N/A;  . Cardiac catheterization  02/24/2014    Procedure: CORONARY BALLOON ANGIOPLASTY;  Surgeon: Burnell Blanks, MD;  Location: [facility] CATH LAB;  Service: Cardiovascular;;  . Abdominal hysterectomy      complete  . Joint replacement      right knee  . Lumbar laminectomy/decompression microdiscectomy Left 12/22/2014    Procedure: CENTRAL DECOMPRESSIVE LUMBAR, AND LAMINECTOMY L5-S1,  S1-S2 FOR SPINAL STENOSIS FORAMINOTOMY L5, S1, S2 ROOTS ON LEFT FOR FORAMINAL STENOSIS, AND DECOMPRESSION L5-S1, S1-S2 ACCORDING TO LABELING OF [place] X-RAYS;  Surgeon: Latanya Maudlin, MD;  Location: [facility] ORS;  Service: Orthopedics;  Laterality: Left;     No current outpatient prescriptions on file.   No current facility-administered medications for this visit.    Allergies:   Contrast media; Cortizone-10; Lipitor; and Zocor    Social History:  The patient  reports that she has never  smoked. She has never used smokeless tobacco. She reports that she does not drink alcohol or use illicit drugs.   Family History:  The patient's family history includes Cancer in her sister; Diabetes in her brother and mother; Emphysema in her father; Heart attack (age of onset: 49) in her mother; Heart disease in her brother and father; Hypertension in her mother. There is no history of Stroke.    ROS:   Please see the history of present illness.   Review of Systems  Constitution: Positive for malaise/fatigue.  HENT: Positive for hearing loss.   Cardiovascular: Positive for chest pain, dyspnea on exertion, irregular heartbeat and leg swelling.  Respiratory: Positive for shortness of breath and snoring.   Hematologic/Lymphatic: Bruises/bleeds easily.  Musculoskeletal: Positive for back pain, joint pain and myalgias.  Neurological: Positive for dizziness and loss of balance.  All other systems reviewed and are  negative.     PHYSICAL EXAM: VS:  BP 140/70 mmHg  Pulse 65  Ht 5\' 7"  (1.702 m)  Wt 218 lb 9.6 oz (99.156 kg)  BMI 34.23 kg/m2    Wt Readings from Last 3 Encounters:  04/19/15 217 lb 4.8 oz (98.567 kg)  04/19/15 218 lb 9.6 oz (99.156 kg)  02/25/15 212 lb (96.163 kg)     GEN: Well nourished, well developed, in no acute distress HEENT: normal Neck: no JVD, + L carotid bruit, no masses Cardiac:  Normal S1/S2, RRR; no murmur, no rubs or gallops, 1+ bilateral LE edema   Respiratory:  clear to auscultation bilaterally, no wheezing, rhonchi or rales. GI: soft, nontender, nondistended, + BS MS: no deformity or atrophy Skin: warm and dry  Neuro:  CNs II-XII intact, Strength and sensation are intact Psych: Normal affect   EKG:  EKG is ordered today.  It demonstrates:   NSR, HR 65, normal axis, low voltage, PVC, QTc 440 ms, no change from prior tracing   Recent Labs: 12/20/2014: ALT 13*; BUN 44*; Creatinine, Ser 1.19*; Hemoglobin 10.9*; Platelets 263; Potassium 4.6; Sodium 138    Lipid Panel    Component Value Date/Time   CHOL 176 07/26/2014 1115   TRIG 195.0* 07/26/2014 1115   HDL 42.90 07/26/2014 1115   CHOLHDL 4 07/26/2014 1115   VLDL 39.0 07/26/2014 1115   LDLCALC 94 07/26/2014 1115      ASSESSMENT AND PLAN:  1.  Chest Pain: Patient presents today with chest pain syndrome similar to her previous angina. She notes these symptoms are similar to what she has had in the past when she required PCI.  She actually feels these are somewhat worse.  She usually has increased belching and dyspnea associated with her chest pain.  Features are typical and atypical, overall.  However, given her hx, I think it is best to admit her for observation and plan cardiac cath tomorrow to further evaluate her symptoms. Other possibilities for her symptoms include volume overload, esophageal spasm/worsening GERD.    -  Admit to Tele for observation  -  Check serial Troponins, BNP, CXR.  -  Start  IV NTG  -  Start DVT dose Lovenox.  Change to full dose if CEs elevated.  -  Continue beta-blocker, ASA, Plavix, ACE inhibitor.  -  Plan Cardiac Cath tomorrow    2. CAD:  S/p multiple prior PCIs. Most recent PCI was overlapping DES x 2 to OM2 in 8/15and POBA to a small PDA branch in 9/15.  Now she presents with symptoms concerning for [place].  Admit as  noted with plans to proceed with cardiac cath tomorrow.  Risks and benefits of cardiac catheterization have been discussed with the patient.  These include bleeding, infection, kidney damage, stroke, heart attack, death.  The patient understands these risks and is willing to proceed.   3. Ischemic Cardiomyopathy:  Prior EF 25-30% in 8/15.  EF has returned to normal by recent echo.   Continue beta-blocker, ACE inhibitor.   4. Chronic Diastolic CHF:  She has some symptoms of volume excess. However, she has clear lungs on exam and her neck veins are flat.  Her LE edema improves when she is supine.  I am not convinced her clinical picture is only related to volume overload.  Will check CXR, BNP. Adjust diuresis if necessary.    5. HTN:  Controlled.  Continue current regimen.  6. Hyperlipidemia:  Intol of statin.   7. Diabetes Mellitus:  Continue Novolin 70/30.  Cover with SSI while NPO.  8. GERD:  Increase Protonix to bid and change Pepcid to 20 mg QHS.   9. L Carotid Bruit:  Arrange Carotid US to evaluate after DC from the hospital.    Medication Changes: Current medicines are reviewed at length with the patient today.  Concerns regarding medicines are as outlined above.  The following changes have been made:   Discontinued Medications   FUROSEMIDE (LASIX) 20 MG TABLET    Take 20 mg by mouth every morning.   TIZANIDINE (ZANAFLEX) 4 MG TABLET    Take 1 tablet (4 mg total) by mouth every 8 (eight) hours as needed.   Modified Medications   No medications on file   New Prescriptions   No medications on file   Labs/ tests ordered today include:    Orders Placed This Encounter  Procedures  . EKG 12-Lead     Disposition:    Admit to [employer] today.      Signed, [name], MHS 04/19/2015 4:33 PM    [facility] [place], [place],   [address] Phone: [phone]; Fax: [phone]

## 2015-04-19 NOTE — Telephone Encounter (Signed)
Reviewed with Richardson Dopp, PA and he can see pt today at 2:30.  I spoke with pt and she will here for this appt.

## 2015-04-19 NOTE — Telephone Encounter (Signed)
Spoke with pt. She reports "chest discomfort" since Saturday. Pretty much continuous.  Has been sleeping poorly. Walks the floors at night.  Complains of heart fluttering and feeling fast at times.  Heaviness is worse with fluttering.  She reports she is having the discomfort/heaviness at this time but reports it is different than pain with her MI.  Short of breath at times.  She report she continues to feel very weak and tired since MI last year.  She reports she had these symptoms when she last saw Dr. Angelena Form but feels they have worsened since then. Will review with provider in office.

## 2015-04-19 NOTE — Progress Notes (Signed)
Hypoglycemic Event  CBG: 65  Treatment: 15 GM carbohydrate snack  Symptoms: None  Follow-up CBG: Time 2228 CBG Result: 115  Possible Reasons for Event: Inadequate meal intake  Comments/MD notified:     Mina Marble

## 2015-04-19 NOTE — Telephone Encounter (Signed)
New Message  Pt called states that she is having heart discomfort.   Pt c/o of Chest heaviness and discomfort: 1. Are you having CP right now? Yes Since Saturday  2. Are you experiencing any other symptoms (ex. SOB, nausea, vomiting, sweating)? Yes SOB and dizziness but nothing else. Pt states that she does have vertigo but she is not sure that it should last this long.  3. How long have you been experiencing the discomfort? Since the last surgery but it has gotten worse since Saturday.  4. Is your CP continuous or coming and going? Continuous. Heaviness behind her breast some sort of sensation that shouldn't be.  5. Have you taken Nitroglycerin? No    Comments: Sometimes it is hard to walk across the floor. Please call back to discuss.

## 2015-04-19 NOTE — Patient Instructions (Addendum)
Medication Instructions:  None  Labwork: None  Testing/Procedures: Your physician has requested that you have a carotid duplex. This test is an ultrasound of the carotid arteries in your neck. It looks at blood flow through these arteries that supply the brain with blood. Allow one hour for this exam. There are no restrictions or special instructions.   Follow-Up: Will be arranged at hospital   Any Other Special Instructions Will Be Listed Below (If Applicable).  Your physician would like for you to proceed to [facility] for a direct admission.  You will go in through the main entrance and stop at the admissions desk.  They will then transfer you to [address].   If you need a refill on your cardiac medications before your next appointment, please call your pharmacy.

## 2015-04-20 ENCOUNTER — Encounter (HOSPITAL_COMMUNITY): Payer: Self-pay | Admitting: General Practice

## 2015-04-20 ENCOUNTER — Encounter (HOSPITAL_COMMUNITY)
Admission: AD | Disposition: A | Discharge status: Skilled Nursing Facility | Payer: Self-pay | Source: Ambulatory Visit | Attending: Interventional Cardiology

## 2015-04-20 ENCOUNTER — Other Ambulatory Visit: Payer: Self-pay | Admitting: *Deleted

## 2015-04-20 DIAGNOSIS — E11649 Type 2 diabetes mellitus with hypoglycemia without coma: Secondary | ICD-10-CM | POA: Diagnosis not present

## 2015-04-20 DIAGNOSIS — M109 Gout, unspecified: Secondary | ICD-10-CM | POA: Diagnosis present

## 2015-04-20 DIAGNOSIS — E1122 Type 2 diabetes mellitus with diabetic chronic kidney disease: Secondary | ICD-10-CM | POA: Diagnosis not present

## 2015-04-20 DIAGNOSIS — Z981 Arthrodesis status: Secondary | ICD-10-CM | POA: Diagnosis not present

## 2015-04-20 DIAGNOSIS — I10 Essential (primary) hypertension: Secondary | ICD-10-CM | POA: Diagnosis not present

## 2015-04-20 DIAGNOSIS — Z888 Allergy status to other drugs, medicaments and biological substances status: Secondary | ICD-10-CM | POA: Diagnosis not present

## 2015-04-20 DIAGNOSIS — I200 Unstable angina: Secondary | ICD-10-CM | POA: Diagnosis not present

## 2015-04-20 DIAGNOSIS — Z79899 Other long term (current) drug therapy: Secondary | ICD-10-CM | POA: Diagnosis not present

## 2015-04-20 DIAGNOSIS — Z8249 Family history of ischemic heart disease and other diseases of the circulatory system: Secondary | ICD-10-CM | POA: Diagnosis not present

## 2015-04-20 DIAGNOSIS — I25110 Atherosclerotic heart disease of native coronary artery with unstable angina pectoris: Secondary | ICD-10-CM | POA: Diagnosis not present

## 2015-04-20 DIAGNOSIS — Z955 Presence of coronary angioplasty implant and graft: Secondary | ICD-10-CM | POA: Diagnosis not present

## 2015-04-20 DIAGNOSIS — Z7982 Long term (current) use of aspirin: Secondary | ICD-10-CM | POA: Diagnosis not present

## 2015-04-20 DIAGNOSIS — Z951 Presence of aortocoronary bypass graft: Secondary | ICD-10-CM | POA: Diagnosis not present

## 2015-04-20 DIAGNOSIS — Z794 Long term (current) use of insulin: Secondary | ICD-10-CM | POA: Diagnosis not present

## 2015-04-20 DIAGNOSIS — D62 Acute posthemorrhagic anemia: Secondary | ICD-10-CM | POA: Diagnosis not present

## 2015-04-20 DIAGNOSIS — I440 Atrioventricular block, first degree: Secondary | ICD-10-CM | POA: Diagnosis not present

## 2015-04-20 DIAGNOSIS — J90 Pleural effusion, not elsewhere classified: Secondary | ICD-10-CM | POA: Diagnosis not present

## 2015-04-20 DIAGNOSIS — Z96651 Presence of right artificial knee joint: Secondary | ICD-10-CM | POA: Diagnosis present

## 2015-04-20 DIAGNOSIS — J45909 Unspecified asthma, uncomplicated: Secondary | ICD-10-CM | POA: Diagnosis present

## 2015-04-20 DIAGNOSIS — I2510 Atherosclerotic heart disease of native coronary artery without angina pectoris: Secondary | ICD-10-CM

## 2015-04-20 DIAGNOSIS — Z91041 Radiographic dye allergy status: Secondary | ICD-10-CM | POA: Diagnosis not present

## 2015-04-20 DIAGNOSIS — N183 Chronic kidney disease, stage 3 (moderate): Secondary | ICD-10-CM | POA: Diagnosis not present

## 2015-04-20 DIAGNOSIS — E785 Hyperlipidemia, unspecified: Secondary | ICD-10-CM

## 2015-04-20 DIAGNOSIS — I252 Old myocardial infarction: Secondary | ICD-10-CM | POA: Diagnosis not present

## 2015-04-20 DIAGNOSIS — I130 Hypertensive heart and chronic kidney disease with heart failure and stage 1 through stage 4 chronic kidney disease, or unspecified chronic kidney disease: Secondary | ICD-10-CM | POA: Diagnosis not present

## 2015-04-20 DIAGNOSIS — I25119 Atherosclerotic heart disease of native coronary artery with unspecified angina pectoris: Secondary | ICD-10-CM | POA: Diagnosis not present

## 2015-04-20 DIAGNOSIS — Z7902 Long term (current) use of antithrombotics/antiplatelets: Secondary | ICD-10-CM | POA: Diagnosis not present

## 2015-04-20 DIAGNOSIS — K219 Gastro-esophageal reflux disease without esophagitis: Secondary | ICD-10-CM

## 2015-04-20 DIAGNOSIS — Z79891 Long term (current) use of opiate analgesic: Secondary | ICD-10-CM | POA: Diagnosis not present

## 2015-04-20 DIAGNOSIS — Z01810 Encounter for preprocedural cardiovascular examination: Secondary | ICD-10-CM | POA: Diagnosis not present

## 2015-04-20 DIAGNOSIS — E119 Type 2 diabetes mellitus without complications: Secondary | ICD-10-CM

## 2015-04-20 DIAGNOSIS — J9811 Atelectasis: Secondary | ICD-10-CM | POA: Diagnosis not present

## 2015-04-20 DIAGNOSIS — I255 Ischemic cardiomyopathy: Secondary | ICD-10-CM | POA: Diagnosis present

## 2015-04-20 DIAGNOSIS — I5022 Chronic systolic (congestive) heart failure: Secondary | ICD-10-CM | POA: Diagnosis not present

## 2015-04-20 DIAGNOSIS — R079 Chest pain, unspecified: Secondary | ICD-10-CM | POA: Diagnosis not present

## 2015-04-20 DIAGNOSIS — E039 Hypothyroidism, unspecified: Secondary | ICD-10-CM | POA: Diagnosis present

## 2015-04-20 DIAGNOSIS — I5032 Chronic diastolic (congestive) heart failure: Secondary | ICD-10-CM | POA: Diagnosis not present

## 2015-04-20 HISTORY — PX: CARDIAC CATHETERIZATION: SHX172

## 2015-04-20 LAB — GLUCOSE, CAPILLARY
GLUCOSE-CAPILLARY: 141 mg/dL — AB (ref 65–99)
GLUCOSE-CAPILLARY: 82 mg/dL (ref 65–99)
GLUCOSE-CAPILLARY: 129 mg/dL — AB (ref 65–99)
Glucose-Capillary: 75 mg/dL (ref 65–99)

## 2015-04-20 LAB — MRSA PCR SCREENING: MRSA by PCR: NEGATIVE

## 2015-04-20 LAB — TROPONIN I

## 2015-04-20 SURGERY — LEFT HEART CATH AND CORONARY ANGIOGRAPHY

## 2015-04-20 MED ORDER — SODIUM CHLORIDE 0.9 % IJ SOLN: 3.0000 mL | INTRAMUSCULAR | Status: DC | PRN

## 2015-04-20 MED ORDER — HEPARIN (PORCINE) IN NACL 2-0.9 UNIT/ML-% IJ SOLN
INTRAMUSCULAR | Status: AC
  Filled 2015-04-20: qty 1500

## 2015-04-20 MED ORDER — ONDANSETRON HCL 4 MG/2ML IJ SOLN
4.0000 mg | Freq: Four times a day (QID) | INTRAMUSCULAR | Status: DC | PRN
  Administered 2015-04-23: 4 mg via INTRAVENOUS
  Filled 2015-04-20: qty 2

## 2015-04-20 MED ORDER — MIDAZOLAM HCL 2 MG/2ML IJ SOLN
INTRAMUSCULAR | Status: DC | PRN
  Administered 2015-04-20 (×2): 1 mg via INTRAVENOUS

## 2015-04-20 MED ORDER — DIPHENHYDRAMINE HCL 50 MG/ML IJ SOLN: 25.0000 mg | Freq: Once | INTRAMUSCULAR | Status: DC

## 2015-04-20 MED ORDER — METHYLPREDNISOLONE SODIUM SUCC 40 MG IJ SOLR
40.0000 mg | Freq: Once | INTRAMUSCULAR | Status: AC
  Administered 2015-04-21: 40 mg via INTRAVENOUS
  Filled 2015-04-20: qty 1

## 2015-04-20 MED ORDER — FAMOTIDINE IN NACL 20-0.9 MG/50ML-% IV SOLN: 20.0000 mg | Freq: Once | INTRAVENOUS | Status: DC

## 2015-04-20 MED ORDER — HEPARIN (PORCINE) IN NACL 100-0.45 UNIT/ML-% IJ SOLN
1050.0000 [IU]/h | INTRAMUSCULAR | Status: DC
  Administered 2015-04-20: 1150 [IU]/h via INTRAVENOUS
  Administered 2015-04-21 – 2015-04-24 (×4): 1050 [IU]/h via INTRAVENOUS
  Filled 2015-04-20 (×5): qty 250

## 2015-04-20 MED ORDER — NITROGLYCERIN 1 MG/10 ML FOR IR/CATH LAB
INTRA_ARTERIAL | Status: DC | PRN
  Administered 2015-04-20: 14:00:00

## 2015-04-20 MED ORDER — LIDOCAINE HCL (PF) 1 % IJ SOLN
INTRAMUSCULAR | Status: AC
  Filled 2015-04-20: qty 30

## 2015-04-20 MED ORDER — FAMOTIDINE IN NACL 20-0.9 MG/50ML-% IV SOLN
20.0000 mg | Freq: Once | INTRAVENOUS | Status: DC
  Filled 2015-04-20: qty 50

## 2015-04-20 MED ORDER — METHYLPREDNISOLONE SODIUM SUCC 125 MG IJ SOLR: 125.0000 mg | Freq: Once | INTRAMUSCULAR | Status: DC

## 2015-04-20 MED ORDER — FENTANYL CITRATE (PF) 100 MCG/2ML IJ SOLN
INTRAMUSCULAR | Status: DC | PRN
  Administered 2015-04-20 (×2): 50 ug via INTRAVENOUS

## 2015-04-20 MED ORDER — SODIUM CHLORIDE 0.9 % IV SOLN: 250.0000 mL | INTRAVENOUS | Status: DC | PRN

## 2015-04-20 MED ORDER — SODIUM CHLORIDE 0.9 % WEIGHT BASED INFUSION: 1.0000 mL/kg/h | INTRAVENOUS | Status: AC

## 2015-04-20 MED ORDER — DIPHENHYDRAMINE HCL 50 MG/ML IJ SOLN
25.0000 mg | Freq: Once | INTRAMUSCULAR | Status: DC
Start: 2015-04-20 — End: 2015-04-22
  Filled 2015-04-20: qty 1

## 2015-04-20 MED ORDER — MIDAZOLAM HCL 2 MG/2ML IJ SOLN
INTRAMUSCULAR | Status: AC
  Filled 2015-04-20: qty 4

## 2015-04-20 MED ORDER — HEPARIN SODIUM (PORCINE) 1000 UNIT/ML IJ SOLN
INTRAMUSCULAR | Status: DC | PRN
  Administered 2015-04-20: 5000 [IU] via INTRAVENOUS

## 2015-04-20 MED ORDER — IOHEXOL 350 MG/ML SOLN
INTRAVENOUS | Status: DC | PRN
  Administered 2015-04-20: 135 mL via INTRACARDIAC

## 2015-04-20 MED ORDER — FENTANYL CITRATE (PF) 100 MCG/2ML IJ SOLN
INTRAMUSCULAR | Status: AC
  Filled 2015-04-20: qty 4

## 2015-04-20 MED ORDER — SODIUM CHLORIDE 0.9 % IJ SOLN
3.0000 mL | Freq: Two times a day (BID) | INTRAMUSCULAR | Status: DC
Start: 2015-04-20 — End: 2015-04-25
  Administered 2015-04-21 – 2015-04-24 (×7): 3 mL via INTRAVENOUS

## 2015-04-20 SURGICAL SUPPLY — 11 items
CATH INFINITI 5FR JL4 (CATHETERS) ×3 IMPLANT
CATH INFINITI JR4 5F (CATHETERS) ×3 IMPLANT
CATH LAUNCHER 5F EBU3.0 (CATHETERS) ×1 IMPLANT
CATHETER LAUNCHER 5F EBU3.0 (CATHETERS) ×3
DEVICE RAD COMP TR BAND LRG (VASCULAR PRODUCTS) ×3 IMPLANT
GLIDESHEATH SLEND A-KIT 6F 22G (SHEATH) ×3 IMPLANT
KIT HEART LEFT (KITS) ×3 IMPLANT
PACK CARDIAC CATHETERIZATION (CUSTOM PROCEDURE TRAY) ×3 IMPLANT
TRANSDUCER W/STOPCOCK (MISCELLANEOUS) ×3 IMPLANT
TUBING CIL FLEX 10 FLL-RA (TUBING) ×3 IMPLANT
WIRE SAFE-T 1.5MM-J .035X260CM (WIRE) ×3 IMPLANT

## 2015-04-20 NOTE — Interval H&P Note (Signed)
Cath Lab Visit (complete for each Cath Lab visit)  Clinical Evaluation Leading to the Procedure:   ACS: Yes.    Non-ACS:    Anginal Classification: CCS III  Anti-ischemic medical therapy: Maximal Therapy (2 or more classes of medications)  Non-Invasive Test Results: No non-invasive testing performed  Prior CABG: No previous CABG      History and Physical Interval Note:  04/20/2015 10:43 AM  Carol [name]  has presented today for surgery, with the diagnosis of cp  The various methods of treatment have been discussed with the patient and family. After consideration of risks, benefits and other options for treatment, the patient has consented to  Procedure(s): Left Heart Cath and Coronary Angiography (N/A) as a surgical intervention .  The patient's history has been reviewed, patient examined, no change in status, stable for surgery.  I have reviewed the patient's chart and labs.  Questions were answered to the patient's satisfaction.     Sinclair Grooms

## 2015-04-20 NOTE — Progress Notes (Signed)
Patient ID: Carol [name], female   DOB: Oct 28, 1935, 79 y.o.   MRN: [id number] Discussed cardiac CT with patient Try to define severity of LM disease  Nurse knows to get 18 g anticubital iv in Given coreg tonight and in am HR 61 Had premeds for contrast allergy today for cath "allergy " was many years ago after dye injection for shoulder and sounds like just "shaking" Will give additional solumedrol and benedryl in am Try to do CT by 9:00 in am  Jenkins Rouge

## 2015-04-20 NOTE — Progress Notes (Signed)
ANTICOAGULATION CONSULT NOTE - Initial Consult  Pharmacy Consult:  Heparin Indication: chest pain/ACS, s/p cath 10/26  Allergies  Allergen Reactions  . Contrast Media [Iodinated Diagnostic Agents] Other (See Comments)    unknown  . Cortizone-10 [Hydrocortisone] Other (See Comments)    unknown  . Lipitor [Atorvastatin]     Muscle aches- severe  . Zocor [Simvastatin]     Muscle aches- severe    Patient Measurements: Height: 5\' 7"  (170.2 cm) Weight: 218 lb 6.4 oz (99.066 kg) (a scale) IBW/kg (Calculated) : 61.6 Heparin Dosing Weight: 84 kg  Vital Signs: Temp: 98.1 F (36.7 C) (10/26 1549) Temp Source: Oral (10/26 1549) BP: 153/60 mmHg (10/26 1600) Pulse Rate: 65 (10/26 1600)  Labs:  Recent Labs  04/19/15 1735 04/19/15 2204 04/20/15 0432  HGB 10.5*  --   --   HCT 33.7*  --   --   PLT 265  --   --   APTT 33  --   --   LABPROT 13.9  --   --   INR 1.05  --   --   CREATININE 1.17*  --   --   TROPONINI <0.03 <0.03 <0.03    Estimated Creatinine Clearance: 48.7 mL/min (by C-G formula based on Cr of 1.17).   Medical History: Past Medical History  Diagnosis Date  . Hypertension   . Asthma   . Hyperlipidemia     a. H/o intolerance to Zocor, Lipitor. Had muscle aches on higher dose Crestor.  . Gout, unspecified   . CAD S/P percutaneous coronary angioplasty 01/22/2014    A. (12/2013): POBA - OM2 99% (very tortuous segment) - reduced ~ 50% & TIMI 3 flow, 80-90% stenosis in mid PDA, Irregularities <50% in mRCA, pLAD and prox LCx; b. 02/17/14: Moderate sized fixed defect in Lateral wall --> cath with OM2 restenosis & otherwise stable --> Xience Alpine DES x 2 (2.25 mm x 12 & 8 mm). c. NSTEMI 02/2014 s/p PTCA/balloon angioplasty only to small caliber PDA.  Marland Kitchen Chronic systolic heart failure ([place])     a. ICM b. ECHO (01/2014): EF 25-30%, grade I DD, trivial MR. c. EF by Myoview 02/17/14 = ~53%. d. EF by cath 02/24/14 - improved to 50-55%.  . Environmental allergies   . Type II  diabetes mellitus ([place])   . GERD (gastroesophageal reflux disease)   . TIA (transient ischemic attack) 1990's    a. TIA vs stroke 1990's "mild" - sounds like a TIA as she was told she had stroke symptoms but negative scans. Denies residual effects.  . Contrast media allergy   . QT prolongation     a. Noted on EKG 02/2014.  Marland Kitchen Anemia     a. Noted on labs 02/2014 possibly procedurally related.  Marland Kitchen LBBB (left bundle branch block)     a. Transient during 02/2014 admission.  . CKD (chronic kidney disease), stage III   . Sinus bradycardia     a. HR 40s-50s during 02/2014 admission, limiting BB dose.  . NSTEMI (non-ST elevated myocardial infarction) ([place]) 2014; 12/2013, 01/2014  . Hypothyroidism        Assessment: 87 YOF with a significant cardiac history presented with angina and taken to the cath lab.  Cath lab note pending completion.  Pharmacy consulted to start IV heparin 8 hours post sheath removal.  RN reported sheath removed at 1500 and no bleeding/hematoma observed.   Goal of Therapy:  Heparin level 0.3-0.7 units/ml Monitor platelets by anticoagulation protocol: Yes  Plan:  - At 2300, start heparin gtt at 1150 units/hr, no bolus - Check 8 hr HL - Daily HL / CBC - F/U plans - Consider checking free T4 level to assess thyroid function    Akshath Mccarey D. Mina Marble, PharmD, BCPS Pager:  [phone] 04/20/2015, 4:53 PM

## 2015-04-20 NOTE — Progress Notes (Signed)
Patient Profile: 79 y.o. female with a hx of CAD, prior ischemic cardiomyopathy with improvement in EF post PCI, diabetes, HTN, HL, asthma and hypothyroidism admitted from the office for ischemic eval given chest pain syndrome similar to her previous angina. Cardiac enzymes are negative x 3.   Subjective: Currently CP free. No dyspnea.   Objective: Vital signs in last 24 hours: Temp:  [97.5 F (36.4 C)-98.5 F (36.9 C)] 98.1 F (36.7 C) (10/26 0451) Pulse Rate:  [59-70] 59 (10/26 0845) Resp:  [16-18] 18 (10/26 0845) BP: (129-156)/(57-93) 155/57 mmHg (10/26 0845) SpO2:  [97 %-100 %] 99 % (10/26 0845) Weight:  [217 lb 4.8 oz (98.567 kg)-218 lb 9.6 oz (99.156 kg)] 218 lb 6.4 oz (99.066 kg) (10/26 0451) Last BM Date: 04/19/15  Intake/Output from previous day: 10/25 0701 - 10/26 0700 In: 557 [P.O.:540; I.V.:17] Out: 850 [Urine:850] Intake/Output this shift:    Medications Current Facility-Administered Medications  Medication Dose Route Frequency Provider Last Rate Last Dose  . 0.9 %  sodium chloride infusion  250 mL Intravenous PRN Liliane Shi, PA-C      . 0.9 %  sodium chloride infusion  250 mL Intravenous PRN Liliane Shi, PA-C      . 0.9 %  sodium chloride infusion   Intravenous Continuous Liliane Shi, PA-C   Stopped at 04/20/15 0600  . acetaminophen (TYLENOL) tablet 650 mg  650 mg Oral Q4H PRN Liliane Shi, PA-C   650 mg at 04/19/15 1845  . albuterol (PROVENTIL) (2.5 MG/3ML) 0.083% nebulizer solution 2.5 mg  2.5 mg Nebulization Q4H PRN Belva Crome, MD      . allopurinol (ZYLOPRIM) tablet 300 mg  300 mg Oral q morning - 10a Scott T Weaver, PA-C      . aspirin EC tablet 81 mg  81 mg Oral Daily Liliane Shi, PA-C      . carvedilol (COREG) tablet 3.125 mg  3.125 mg Oral BID WC Liliane Shi, PA-C   3.125 mg at 04/19/15 2057  . clopidogrel (PLAVIX) tablet 75 mg  75 mg Oral Daily Liliane Shi, PA-C      . colchicine tablet 0.6 mg  0.6 mg Oral Daily PRN Liliane Shi, PA-C      . enoxaparin (LOVENOX) injection 40 mg  40 mg Subcutaneous Q24H Liliane Shi, PA-C   40 mg at 04/19/15 1853  . famotidine (PEPCID) tablet 20 mg  20 mg Oral QHS Liliane Shi, PA-C   20 mg at 04/19/15 2250  . furosemide (LASIX) tablet 40 mg  40 mg Oral Daily Liliane Shi, PA-C      . insulin aspart (novoLOG) injection 0-20 Units  0-20 Units Subcutaneous TID WC Liliane Shi, PA-C   3 Units at 04/20/15 [phone]  . insulin aspart (novoLOG) injection 0-5 Units  0-5 Units Subcutaneous QHS Liliane Shi, PA-C   0 Units at 04/19/15 2200  . insulin aspart protamine- aspart (NOVOLOG MIX 70/30) injection 32 Units  32 Units Subcutaneous Q breakfast Liliane Shi, PA-C   Stopped at 04/20/15 0800  . levothyroxine (SYNTHROID, LEVOTHROID) tablet 50 mcg  50 mcg Oral QAC breakfast Liliane Shi, PA-C   50 mcg at 04/20/15 0630  . lisinopril (PRINIVIL,ZESTRIL) tablet 2.5 mg  2.5 mg Oral q morning - 10a Scott T Weaver, PA-C      . multivitamin with minerals tablet 1 tablet  1 tablet Oral Daily Belva Crome, MD      .  nitroGLYCERIN (NITROSTAT) SL tablet 0.4 mg  0.4 mg Sublingual Q5 min PRN Liliane Shi, PA-C      . nitroGLYCERIN 50 mg in dextrose 5 % 250 mL (0.2 mg/mL) infusion  5 mcg/min Intravenous Continuous Liliane Shi, PA-C 1.5 mL/hr at 04/19/15 1842 5 mcg/min at 04/19/15 1842  . ondansetron (ZOFRAN) injection 4 mg  4 mg Intravenous Q6H PRN Liliane Shi, PA-C      . oxyCODONE-acetaminophen (PERCOCET/ROXICET) 5-325 MG per tablet 1-2 tablet  1-2 tablet Oral Q4H PRN Liliane Shi, PA-C      . pantoprazole (PROTONIX) EC tablet 40 mg  40 mg Oral BID Liliane Shi, PA-C   40 mg at 04/19/15 2250  . sodium chloride 0.9 % injection 3 mL  3 mL Intravenous Q12H Liliane Shi, PA-C   0 mL at 04/19/15 2200  . sodium chloride 0.9 % injection 3 mL  3 mL Intravenous PRN Liliane Shi, PA-C      . sodium chloride 0.9 % injection 3 mL  3 mL Intravenous Q12H Liliane Shi, PA-C   3 mL at 04/19/15  2200  . sodium chloride 0.9 % injection 3 mL  3 mL Intravenous PRN Liliane Shi, PA-C      . traMADol Veatrice Bourbon) tablet 50 mg  50 mg Oral Daily PRN Liliane Shi, PA-C        PE: General appearance: alert, cooperative, no distress and moderately obese Neck: + left carotid bruit and no JVD Lungs: clear to auscultation bilaterally Heart: regular rate and rhythm, S1, S2 normal, no murmur, click, rub or gallop Extremities: no LEE Pulses: 2+ and symmetric Skin: warm and dry Neurologic: Grossly normal  Lab Results:   Recent Labs  04/19/15 1735  WBC 5.8  HGB 10.5*  HCT 33.7*  PLT 265   BMET  Recent Labs  04/19/15 1735  NA 141  K 4.7  CL 106  CO2 29  GLUCOSE 81  BUN 29*  CREATININE 1.17*  CALCIUM 9.6   PT/INR  Recent Labs  04/19/15 1735  LABPROT 13.9  INR 1.05   Cardiac Panel (last 3 results)  Recent Labs  04/19/15 1735 04/19/15 2204 04/20/15 0432  TROPONINI <0.03 <0.03 <0.03    Assessment/Plan   Principal Problem:   Unstable angina (HCC) Active Problems:   Essential hypertension   Hyperlipidemia with target LDL less than 70   Ischemic cardiomyopathy - with near resolution of LVEF post MI   Chronic diastolic CHF (congestive heart failure) (HCC)   GERD (gastroesophageal reflux disease)  1. Chest Pain: recent CP syndrome is similar to her previous angina. She has atypical symptoms that on each prior occasion have dissipated after percutaneous coronary intervention. Cardiac enzymes are negative x 3. Will plan for [facility] +/- PCI, hopefully today. Will need to be performed from the left radial approach.  2. CAD: S/p multiple prior PCIs. Most recent PCI was overlapping DES x 2 to OM2 in 8/15and POBA to a small PDA branch in 9/15. Now she presents with symptoms concerning for [place]. Plan for LHC to reassess her coronary anatomy.   3. Ischemic Cardiomyopathy: Prior EF 25-30% in 8/15. EF has returned to normal by recent echo. Continue beta-blocker, ACE  inhibitor.   4. Chronic Diastolic CHF: stable. BNP yesterday was normal at 36.6. Continue PO lasix.   5. HTN: Moderately elevated. Continue current regimen but may need to titrate her lisinopril. Currently on low dose 2.5. No room to adjust Coreg  given baseline bradycardia.   6. Hyperlipidemia: Intol of statin.   7. Diabetes Mellitus: Continue Novolin 70/30. Cover with SSI while NPO.  8. GERD: Increase Protonix to bid and change Pepcid to 20 mg QHS.   9. L Carotid Bruit: Arrange Carotid US to evaluate after DC from the hospital.  Wilmer Floor. Ladoris Gene 04/20/2015 9:01 AM  The patient was seen, examined and discussed with Brittainy M. Rosita Fire, PA-C and I agree with the above.   79 y.o. female with a hx of CAD, prior ischemic cardiomyopathy with improvement in EF post PCI, diabetes, HTN, HL, asthma and hypothyroidism admitted from the office for ischemic eval given chest pain syndrome similar to her previous angina. Cardiac enzymes are negative x 3. ECG is unchanged. She is scheduled for a left heart cath today.  [name] 04/20/2015

## 2015-04-20 NOTE — H&P (View-Only) (Signed)
Patient Profile: 79 y.o. female with a hx of CAD, prior ischemic cardiomyopathy with improvement in EF post PCI, diabetes, HTN, HL, asthma and hypothyroidism admitted from the office for ischemic eval given chest pain syndrome similar to her previous angina. Cardiac enzymes are negative x 3.   Subjective: Currently CP free. No dyspnea.   Objective: Vital signs in last 24 hours: Temp:  [97.5 F (36.4 C)-98.5 F (36.9 C)] 98.1 F (36.7 C) (10/26 0451) Pulse Rate:  [59-70] 59 (10/26 0845) Resp:  [16-18] 18 (10/26 0845) BP: (129-156)/(57-93) 155/57 mmHg (10/26 0845) SpO2:  [97 %-100 %] 99 % (10/26 0845) Weight:  [217 lb 4.8 oz (98.567 kg)-218 lb 9.6 oz (99.156 kg)] 218 lb 6.4 oz (99.066 kg) (10/26 0451) Last BM Date: 04/19/15  Intake/Output from previous day: 10/25 0701 - 10/26 0700 In: 557 [P.O.:540; I.V.:17] Out: 850 [Urine:850] Intake/Output this shift:    Medications Current Facility-Administered Medications  Medication Dose Route Frequency Provider Last Rate Last Dose  . 0.9 %  sodium chloride infusion  250 mL Intravenous PRN Liliane Shi, PA-C      . 0.9 %  sodium chloride infusion  250 mL Intravenous PRN Liliane Shi, PA-C      . 0.9 %  sodium chloride infusion   Intravenous Continuous Liliane Shi, PA-C   Stopped at 04/20/15 0600  . acetaminophen (TYLENOL) tablet 650 mg  650 mg Oral Q4H PRN Liliane Shi, PA-C   650 mg at 04/19/15 1845  . albuterol (PROVENTIL) (2.5 MG/3ML) 0.083% nebulizer solution 2.5 mg  2.5 mg Nebulization Q4H PRN Belva Crome, MD      . allopurinol (ZYLOPRIM) tablet 300 mg  300 mg Oral q morning - 10a Scott T Weaver, PA-C      . aspirin EC tablet 81 mg  81 mg Oral Daily Liliane Shi, PA-C      . carvedilol (COREG) tablet 3.125 mg  3.125 mg Oral BID WC Liliane Shi, PA-C   3.125 mg at 04/19/15 2057  . clopidogrel (PLAVIX) tablet 75 mg  75 mg Oral Daily Liliane Shi, PA-C      . colchicine tablet 0.6 mg  0.6 mg Oral Daily PRN Liliane Shi, PA-C      . enoxaparin (LOVENOX) injection 40 mg  40 mg Subcutaneous Q24H Liliane Shi, PA-C   40 mg at 04/19/15 1853  . famotidine (PEPCID) tablet 20 mg  20 mg Oral QHS Liliane Shi, PA-C   20 mg at 04/19/15 2250  . furosemide (LASIX) tablet 40 mg  40 mg Oral Daily Liliane Shi, PA-C      . insulin aspart (novoLOG) injection 0-20 Units  0-20 Units Subcutaneous TID WC Liliane Shi, PA-C   3 Units at 04/20/15 [phone]  . insulin aspart (novoLOG) injection 0-5 Units  0-5 Units Subcutaneous QHS Liliane Shi, PA-C   0 Units at 04/19/15 2200  . insulin aspart protamine- aspart (NOVOLOG MIX 70/30) injection 32 Units  32 Units Subcutaneous Q breakfast Liliane Shi, PA-C   Stopped at 04/20/15 0800  . levothyroxine (SYNTHROID, LEVOTHROID) tablet 50 mcg  50 mcg Oral QAC breakfast Liliane Shi, PA-C   50 mcg at 04/20/15 0630  . lisinopril (PRINIVIL,ZESTRIL) tablet 2.5 mg  2.5 mg Oral q morning - 10a Scott T Weaver, PA-C      . multivitamin with minerals tablet 1 tablet  1 tablet Oral Daily Belva Crome, MD      .  nitroGLYCERIN (NITROSTAT) SL tablet 0.4 mg  0.4 mg Sublingual Q5 min PRN Liliane Shi, PA-C      . nitroGLYCERIN 50 mg in dextrose 5 % 250 mL (0.2 mg/mL) infusion  5 mcg/min Intravenous Continuous Liliane Shi, PA-C 1.5 mL/hr at 04/19/15 1842 5 mcg/min at 04/19/15 1842  . ondansetron (ZOFRAN) injection 4 mg  4 mg Intravenous Q6H PRN Liliane Shi, PA-C      . oxyCODONE-acetaminophen (PERCOCET/ROXICET) 5-325 MG per tablet 1-2 tablet  1-2 tablet Oral Q4H PRN Liliane Shi, PA-C      . pantoprazole (PROTONIX) EC tablet 40 mg  40 mg Oral BID Liliane Shi, PA-C   40 mg at 04/19/15 2250  . sodium chloride 0.9 % injection 3 mL  3 mL Intravenous Q12H Liliane Shi, PA-C   0 mL at 04/19/15 2200  . sodium chloride 0.9 % injection 3 mL  3 mL Intravenous PRN Liliane Shi, PA-C      . sodium chloride 0.9 % injection 3 mL  3 mL Intravenous Q12H Liliane Shi, PA-C   3 mL at 04/19/15  2200  . sodium chloride 0.9 % injection 3 mL  3 mL Intravenous PRN Liliane Shi, PA-C      . traMADol Veatrice Bourbon) tablet 50 mg  50 mg Oral Daily PRN Liliane Shi, PA-C        PE: General appearance: alert, cooperative, no distress and moderately obese Neck: + left carotid bruit and no JVD Lungs: clear to auscultation bilaterally Heart: regular rate and rhythm, S1, S2 normal, no murmur, click, rub or gallop Extremities: no LEE Pulses: 2+ and symmetric Skin: warm and dry Neurologic: Grossly normal  Lab Results:   Recent Labs  04/19/15 1735  WBC 5.8  HGB 10.5*  HCT 33.7*  PLT 265   BMET  Recent Labs  04/19/15 1735  NA 141  K 4.7  CL 106  CO2 29  GLUCOSE 81  BUN 29*  CREATININE 1.17*  CALCIUM 9.6   PT/INR  Recent Labs  04/19/15 1735  LABPROT 13.9  INR 1.05   Cardiac Panel (last 3 results)  Recent Labs  04/19/15 1735 04/19/15 2204 04/20/15 0432  TROPONINI <0.03 <0.03 <0.03    Assessment/Plan   Principal Problem:   Unstable angina (HCC) Active Problems:   Essential hypertension   Hyperlipidemia with target LDL less than 70   Ischemic cardiomyopathy - with near resolution of LVEF post MI   Chronic diastolic CHF (congestive heart failure) (HCC)   GERD (gastroesophageal reflux disease)  1. Chest Pain: recent CP syndrome is similar to her previous angina. She has atypical symptoms that on each prior occasion have dissipated after percutaneous coronary intervention. Cardiac enzymes are negative x 3. Will plan for [facility] +/- PCI, hopefully today. Will need to be performed from the left radial approach.  2. CAD: S/p multiple prior PCIs. Most recent PCI was overlapping DES x 2 to OM2 in 8/15and POBA to a small PDA branch in 9/15. Now she presents with symptoms concerning for [place]. Plan for LHC to reassess her coronary anatomy.   3. Ischemic Cardiomyopathy: Prior EF 25-30% in 8/15. EF has returned to normal by recent echo. Continue beta-blocker, ACE  inhibitor.   4. Chronic Diastolic CHF: stable. BNP yesterday was normal at 36.6. Continue PO lasix.   5. HTN: Moderately elevated. Continue current regimen but may need to titrate her lisinopril. Currently on low dose 2.5. No room to adjust Coreg  given baseline bradycardia.   6. Hyperlipidemia: Intol of statin.   7. Diabetes Mellitus: Continue Novolin 70/30. Cover with SSI while NPO.  8. GERD: Increase Protonix to bid and change Pepcid to 20 mg QHS.   9. L Carotid Bruit: Arrange Carotid US to evaluate after DC from the hospital.  Wilmer Floor. Ladoris Gene 04/20/2015 9:01 AM  The patient was seen, examined and discussed with Brittainy M. Rosita Fire, PA-C and I agree with the above.   79 y.o. female with a hx of CAD, prior ischemic cardiomyopathy with improvement in EF post PCI, diabetes, HTN, HL, asthma and hypothyroidism admitted from the office for ischemic eval given chest pain syndrome similar to her previous angina. Cardiac enzymes are negative x 3. ECG is unchanged. She is scheduled for a left heart cath today.  [name] 04/20/2015

## 2015-04-21 ENCOUNTER — Encounter (HOSPITAL_COMMUNITY): Payer: Self-pay | Admitting: Interventional Cardiology

## 2015-04-21 ENCOUNTER — Inpatient Hospital Stay (HOSPITAL_COMMUNITY): Payer: Medicare Other

## 2015-04-21 DIAGNOSIS — I2510 Atherosclerotic heart disease of native coronary artery without angina pectoris: Secondary | ICD-10-CM

## 2015-04-21 DIAGNOSIS — I200 Unstable angina: Secondary | ICD-10-CM

## 2015-04-21 DIAGNOSIS — Z01810 Encounter for preprocedural cardiovascular examination: Secondary | ICD-10-CM

## 2015-04-21 DIAGNOSIS — I25110 Atherosclerotic heart disease of native coronary artery with unstable angina pectoris: Secondary | ICD-10-CM

## 2015-04-21 DIAGNOSIS — R079 Chest pain, unspecified: Secondary | ICD-10-CM

## 2015-04-21 LAB — HEPARIN LEVEL (UNFRACTIONATED)
HEPARIN UNFRACTIONATED: 0.48 [IU]/mL (ref 0.30–0.70)
Heparin Unfractionated: 0.68 IU/mL (ref 0.30–0.70)

## 2015-04-21 LAB — CBC
HEMATOCRIT: 34.9 % — AB (ref 36.0–46.0)
Hemoglobin: 11.4 g/dL — ABNORMAL LOW (ref 12.0–15.0)
MCH: 30.1 pg (ref 26.0–34.0)
MCHC: 32.7 g/dL (ref 30.0–36.0)
MCV: 92.1 fL (ref 78.0–100.0)
Platelets: 236 10*3/uL (ref 150–400)
RBC: 3.79 MIL/uL — ABNORMAL LOW (ref 3.87–5.11)
RDW: 14.7 % (ref 11.5–15.5)
WBC: 5.6 10*3/uL (ref 4.0–10.5)

## 2015-04-21 LAB — GLUCOSE, CAPILLARY
GLUCOSE-CAPILLARY: 157 mg/dL — AB (ref 65–99)
Glucose-Capillary: 209 mg/dL — ABNORMAL HIGH (ref 65–99)
Glucose-Capillary: 146 mg/dL — ABNORMAL HIGH (ref 65–99)
Glucose-Capillary: 247 mg/dL — ABNORMAL HIGH (ref 65–99)

## 2015-04-21 MED ORDER — IOHEXOL 350 MG/ML SOLN
80.0000 mL | Freq: Once | INTRAVENOUS | Status: AC | PRN
  Administered 2015-04-21: 80 mL via INTRAVENOUS

## 2015-04-21 MED ORDER — ADULT MULTIVITAMIN W/MINERALS CH
1.0000 | ORAL_TABLET | Freq: Every day | ORAL | Status: DC
Start: 2015-04-21 — End: 2015-04-25
  Administered 2015-04-21 – 2015-04-24 (×4): 1 via ORAL
  Filled 2015-04-21 (×4): qty 1

## 2015-04-21 MED ORDER — MAGNESIUM HYDROXIDE 400 MG/5ML PO SUSP
30.0000 mL | Freq: Every day | ORAL | Status: DC | PRN
  Administered 2015-04-21: 30 mL via ORAL
  Filled 2015-04-21: qty 30

## 2015-04-21 MED ORDER — PANTOPRAZOLE SODIUM 40 MG PO TBEC
40.0000 mg | DELAYED_RELEASE_TABLET | Freq: Two times a day (BID) | ORAL | Status: DC
  Administered 2015-04-21 – 2015-04-24 (×8): 40 mg via ORAL
  Filled 2015-04-21 (×8): qty 1

## 2015-04-21 MED ORDER — METOPROLOL TARTRATE 1 MG/ML IV SOLN
5.0000 mg | Freq: Once | INTRAVENOUS | Status: AC
  Administered 2015-04-21: 5 mg via INTRAVENOUS

## 2015-04-21 MED ORDER — METOPROLOL TARTRATE 1 MG/ML IV SOLN
INTRAVENOUS | Status: AC
  Filled 2015-04-21: qty 5

## 2015-04-21 MED ORDER — LEVOTHYROXINE SODIUM 50 MCG PO TABS
50.0000 ug | ORAL_TABLET | Freq: Every day | ORAL | Status: DC
  Administered 2015-04-21 – 2015-04-30 (×9): 50 ug via ORAL
  Filled 2015-04-21 (×11): qty 1

## 2015-04-21 MED ORDER — DIPHENHYDRAMINE HCL 50 MG/ML IJ SOLN
25.0000 mg | Freq: Once | INTRAMUSCULAR | Status: AC
  Administered 2015-04-21: 25 mg via INTRAVENOUS

## 2015-04-21 MED ORDER — FAMOTIDINE 20 MG PO TABS
20.0000 mg | ORAL_TABLET | Freq: Every day | ORAL | Status: DC
  Administered 2015-04-21 – 2015-04-24 (×4): 20 mg via ORAL
  Filled 2015-04-21 (×4): qty 1

## 2015-04-21 MED ORDER — OXYCODONE-ACETAMINOPHEN 5-325 MG PO TABS: 1.0000 | ORAL_TABLET | ORAL | Status: DC | PRN

## 2015-04-21 MED ORDER — TRAMADOL HCL 50 MG PO TABS: 50.0000 mg | ORAL_TABLET | Freq: Every day | ORAL | Status: DC | PRN

## 2015-04-21 NOTE — Progress Notes (Signed)
Patient Profile: 79 y.o. female with a hx of CAD, prior ischemic cardiomyopathy with improvement in EF post PCI, diabetes, HTN, HL, asthma and hypothyroidism admitted from the office for ischemic eval given chest pain syndrome similar to her previous angina. Cardiac enzymes are negative x 3.   Subjective: Currently CP free. No dyspnea. On iv heparin and iv NTG.  Objective: Vital signs in last 24 hours: Temp:  [98 F (36.7 C)-99.2 F (37.3 C)] 98 F (36.7 C) (10/27 0835) Pulse Rate:  [57-295] 65 (10/27 0940) Resp:  [0-90] 19 (10/27 0940) BP: (101-187)/(44-94) 115/51 mmHg (10/27 0940) SpO2:  [0 %-100 %] 97 % (10/27 0940) Last BM Date: 04/19/15  Intake/Output from previous day: 10/26 0701 - 10/27 0700 In: 1033.5 [P.O.:460; I.V.:573.5] Out: 2225 [Urine:2225] Intake/Output this shift: Total I/O In: -  Out: 250 [Urine:250]  Medications . allopurinol  300 mg Oral q morning - 10a  . aspirin EC  81 mg Oral Daily  . carvedilol  3.125 mg Oral BID WC  . diphenhydrAMINE  25 mg Intravenous Once  . famotidine  20 mg Oral QHS  . furosemide  40 mg Oral Daily  . insulin aspart  0-20 Units Subcutaneous TID WC  . insulin aspart  0-5 Units Subcutaneous QHS  . insulin aspart protamine- aspart  32 Units Subcutaneous Q breakfast  . levothyroxine  50 mcg Oral QAC breakfast  . multivitamin with minerals  1 tablet Oral Daily  . pantoprazole  40 mg Oral BID  . sodium chloride  3 mL Intravenous Q12H   . heparin 1,150 Units/hr (04/20/15 2304)  . nitroGLYCERIN 10 mcg/min (04/20/15 2100)    PE: General appearance: alert, cooperative, no distress and moderately obese Neck: + left carotid bruit and no JVD Lungs: clear to auscultation bilaterally Heart: regular rate and rhythm, S1, S2 normal, no murmur, click, rub or gallop Extremities: no LEE Pulses: 2+ and symmetric Skin: warm and dry Neurologic: Grossly normal  Lab Results:   Recent Labs  04/19/15 1735 04/21/15 0657  WBC 5.8 5.6    HGB 10.5* 11.4*  HCT 33.7* 34.9*  PLT 265 236   BMET  Recent Labs  04/19/15 1735  NA 141  K 4.7  CL 106  CO2 29  GLUCOSE 81  BUN 29*  CREATININE 1.17*  CALCIUM 9.6   PT/INR  Recent Labs  04/19/15 1735  LABPROT 13.9  INR 1.05   Cardiac Panel (last 3 results)  Recent Labs  04/19/15 1735 04/19/15 2204 04/20/15 0432  TROPONINI <0.03 <0.03 <0.03    Assessment/Plan   Principal Problem:   Unstable angina (HCC) Active Problems:   Essential hypertension   Hyperlipidemia with target LDL less than 70   Ischemic cardiomyopathy - with near resolution of LVEF post MI   Chronic diastolic CHF (congestive heart failure) (HCC)   GERD (gastroesophageal reflux disease)  1. CAD: admitted with unstable angina, cathed yesterday with findings of  1. Mid RCA to Dist RCA lesion, 70% stenosed. 2. 1st RPLB lesion, 70% stenosed. 3. 1st Mrg-1 lesion, 50% stenosed. 4. Dist Cx lesion, 40% stenosed. 5. Prox LAD to Dist LAD lesion, 25% stenosed. 6. LM lesion, 50% stenosed.  Cardiac CT this am showed moderate LM disease with high risk mixed plaque. She is scheduled for a CABG with Dr Cyndia Bent on Monday. We will d/c Plavix.  2. Ischemic Cardiomyopathy: Prior EF 25-30% in 8/15. EF has returned to normal by recent echo. Continue beta-blocker, hold Lisinopril with elevated Crea post cardiac  cath.  3. Chronic Diastolic CHF: stable. BNP yesterday was normal at 36.6. Continue PO lasix.   4. HTN: Moderately elevated. Continue current regimen but may need to titrate her lisinopril. Currently on low dose 2.5. No room to adjust Coreg given baseline bradycardia.   5. Hyperlipidemia: Intol of statin.   6. Diabetes Mellitus: Continue Novolin 70/30. Cover with SSI while NPO.  7. GERD: Increase Protonix to bid and change Pepcid to 20 mg QHS.   8. L Carotid Bruit: Arrange Carotid US to evaluate after DC from the hospital.    [name] 04/21/2015

## 2015-04-21 NOTE — Progress Notes (Addendum)
ANTICOAGULATION CONSULT NOTE  Pharmacy Consult:  Heparin Indication: chest pain/ACS, s/p cath 10/26  Allergies  Allergen Reactions  . Contrast Media [Iodinated Diagnostic Agents] Other (See Comments)    unknown  . Cortizone-10 [Hydrocortisone] Other (See Comments)    unknown  . Lipitor [Atorvastatin]     Muscle aches- severe  . Zocor [Simvastatin]     Muscle aches- severe    Patient Measurements: Height: 5\' 7"  (170.2 cm) Weight: 218 lb 6.4 oz (99.066 kg) (a scale) IBW/kg (Calculated) : 61.6 Heparin Dosing Weight: 84 kg  Vital Signs: Temp: 98.5 F (36.9 C) (10/27 0357) Temp Source: Oral (10/27 0357) BP: 162/45 mmHg (10/27 0854) Pulse Rate: 64 (10/27 0900)  Labs:  Recent Labs  04/19/15 1735 04/19/15 2204 04/20/15 0432 04/21/15 0657 04/21/15 0700  HGB 10.5*  --   --  11.4*  --   HCT 33.7*  --   --  34.9*  --   PLT 265  --   --  236  --   APTT 33  --   --   --   --   LABPROT 13.9  --   --   --   --   INR 1.05  --   --   --   --   HEPARINUNFRC  --   --   --   --  0.48  CREATININE 1.17*  --   --   --   --   TROPONINI <0.03 <0.03 <0.03  --   --     Estimated Creatinine Clearance: 48.7 mL/min (by C-G formula based on Cr of 1.17).   Medical History: Past Medical History  Diagnosis Date  . Hypertension   . Asthma   . Hyperlipidemia     a. H/o intolerance to Zocor, Lipitor. Had muscle aches on higher dose Crestor.  . Gout, unspecified   . CAD S/P percutaneous coronary angioplasty 01/22/2014    A. (12/2013): POBA - OM2 99% (very tortuous segment) - reduced ~ 50% & TIMI 3 flow, 80-90% stenosis in mid PDA, Irregularities <50% in mRCA, pLAD and prox LCx; b. 02/17/14: Moderate sized fixed defect in Lateral wall --> cath with OM2 restenosis & otherwise stable --> Xience Alpine DES x 2 (2.25 mm x 12 & 8 mm). c. NSTEMI 02/2014 s/p PTCA/balloon angioplasty only to small caliber PDA.  Marland Kitchen Chronic systolic heart failure ([place])     a. ICM b. ECHO (01/2014): EF 25-30%, grade I DD,  trivial MR. c. EF by Myoview 02/17/14 = ~53%. d. EF by cath 02/24/14 - improved to 50-55%.  . Environmental allergies   . Type II diabetes mellitus ([place])   . GERD (gastroesophageal reflux disease)   . TIA (transient ischemic attack) 1990's    a. TIA vs stroke 1990's "mild" - sounds like a TIA as she was told she had stroke symptoms but negative scans. Denies residual effects.  . Contrast media allergy   . QT prolongation     a. Noted on EKG 02/2014.  Marland Kitchen Anemia     a. Noted on labs 02/2014 possibly procedurally related.  Marland Kitchen LBBB (left bundle branch block)     a. Transient during 02/2014 admission.  . CKD (chronic kidney disease), stage III   . Sinus bradycardia     a. HR 40s-50s during 02/2014 admission, limiting BB dose.  . NSTEMI (non-ST elevated myocardial infarction) ([place]) 2014; 12/2013, 01/2014  . Hypothyroidism     Assessment: 37 YOF with a significant cardiac history presented with  angina and taken to the cath lab.  Cath lab note pending completion.  Pharmacy consulted to start IV heparin post cath.   Heparin level was at goal this am (0.48). CBC stable, no bleeding noted.  Goal of Therapy:  Heparin level 0.3-0.7 units/ml Monitor platelets by anticoagulation protocol: Yes   Plan:  - Continue heparin at 1150/hr - Recheck HL at 1300 - Daily HL / CBC  Erin Hearing PharmD., BCPS Clinical Pharmacist Pager [phone] 04/21/2015 10:06 AM  Addendum Follow up heparin level has trended up to 0.68. Now at upper end of goal will reduce rate to 1050/hr and recheck HL daily along with CBC. Plavix off now and surgery is tentatively scheduled for Monday.  04/21/2015 2:02 PM

## 2015-04-21 NOTE — Consult Note (Signed)
[address]       [name] [address]             [phone]      Cardiothoracic Surgery Consultation   Reason for Consult: Left main and multi-vessel coronary artery disease with progressive angina Referring Physician: Dr. Daneen Schick   Carol [name] is an 79 y.o. female.  HPI:   The patient is a 79 year old woman with hypertension, hyperlipidemia, poorly controlled DM and coronary disease s/p PTCA of the OM2 on 01/22/2014 after NSTEMI. She continued to have chest pain after that and a stress myoview showed a perfusion defect in the lateral wall so she underwent repeat cath on 02/17/2014 showing severe restenosis of the OM2 successfully treated with PTCA/DES x 2. She had recurrent CP and a slight elevation of her troponin and underwent PTCA of the small PDA that had a 99% stenosis. Stent was not placed due to small vessel size. She says that she has had intermittent exertional chest pain since then but has cut back her activity to minimize it. She underwent spine surgery in June 2016 for spinal stenosis. She now presents with progressive angina with CP occuring with minimal exertion like walking around in her house, associated with fatigue. Cardiac cath yesterday showed some abnormality of the mid to distal LM but difficult to tell if it was stenosis or contrast streaming. The previous OM stents were patent. The RCA has a new 70% mid vessel stenosis. LVEF 50%. She had a cardiac CT today to examine the left main further which shows a large area of calcium at the ostium and proximal LM with an area of soft plaque with a lucent core in the mid to distal LM with 50-75% stenosis. The ostium of the LAD is heavily calcified.  Past Medical History  Diagnosis Date  . Hypertension   . Asthma   . Hyperlipidemia     a. H/o intolerance to Zocor, Lipitor. Had muscle aches on higher dose Crestor.  . Gout, unspecified   . CAD S/P percutaneous coronary angioplasty 01/22/2014    A.  (12/2013): POBA - OM2 99% (very tortuous segment) - reduced ~ 50% & TIMI 3 flow, 80-90% stenosis in mid PDA, Irregularities <50% in mRCA, pLAD and prox LCx; b. 02/17/14: Moderate sized fixed defect in Lateral wall --> cath with OM2 restenosis & otherwise stable --> Xience Alpine DES x 2 (2.25 mm x 12 & 8 mm). c. NSTEMI 02/2014 s/p PTCA/balloon angioplasty only to small caliber PDA.  Marland Kitchen Chronic systolic heart failure ([place])     a. ICM b. ECHO (01/2014): EF 25-30%, grade I DD, trivial MR. c. EF by Myoview 02/17/14 = ~53%. d. EF by cath 02/24/14 - improved to 50-55%.  . Environmental allergies   . Type II diabetes mellitus ([place])   . GERD (gastroesophageal reflux disease)   . TIA (transient ischemic attack) 1990's    a. TIA vs stroke 1990's "mild" - sounds like a TIA as she was told she had stroke symptoms but negative scans. Denies residual effects.  . Contrast media allergy   . QT prolongation     a. Noted on EKG 02/2014.  Marland Kitchen Anemia     a. Noted on labs 02/2014 possibly procedurally related.  Marland Kitchen LBBB (left bundle branch block)     a. Transient during 02/2014 admission.  . CKD (chronic kidney disease), stage III   . Sinus bradycardia     a. HR 40s-50s  during 02/2014 admission, limiting BB dose.  . NSTEMI (non-ST elevated myocardial infarction) ([place]) 2014; 12/2013, 01/2014  . Hypothyroidism     Past Surgical History  Procedure Laterality Date  . Shoulder open rotator cuff repair Bilateral 1980's - 1990's  . Total knee arthroplasty Right 2009  . Cesarean section  3570; 1960  . Appendectomy  1959  . Cholecystectomy  1989  . Reduction mammaplasty Bilateral 1990's  . Cataract extraction w/ intraocular lens  implant, bilateral Bilateral 2013  . Coronary angioplasty  12/2013    POBA - OM2  . Percutaneous coronary stent intervention (pci-s)  02/17/14    For restenosis of OM2 - PCI with Xience Apline DES 2.25 mm x 12 mm & 2.25 mm x 8 mm overlapping  . Left heart catheterization with coronary angiogram N/A  01/22/2014    Procedure: LEFT HEART CATHETERIZATION WITH CORONARY ANGIOGRAM;  Surgeon: Carol Grooms, MD;  Location: [facility] CATH LAB;  Service: Cardiovascular;  Laterality: N/A;  . Left heart catheterization with coronary angiogram N/A 02/17/2014    Procedure: LEFT HEART CATHETERIZATION WITH CORONARY ANGIOGRAM;  Surgeon: Carol Blanks, MD;  Location: [facility] CATH LAB;  Service: Cardiovascular;  Laterality: N/A;  . Left heart catheterization with coronary angiogram N/A 02/24/2014    Procedure: LEFT HEART CATHETERIZATION WITH CORONARY ANGIOGRAM;  Surgeon: Carol Blanks, MD;  Location: [facility] CATH LAB;  Service: Cardiovascular;  Laterality: N/A;  . Cardiac catheterization  02/24/2014    Procedure: CORONARY BALLOON ANGIOPLASTY;  Surgeon: Carol Blanks, MD;  Location: [facility] CATH LAB;  Service: Cardiovascular;;  . Abdominal hysterectomy      complete  . Joint replacement      right knee  . Lumbar laminectomy/decompression microdiscectomy Left 12/22/2014    Procedure: CENTRAL DECOMPRESSIVE LUMBAR, AND LAMINECTOMY L5-S1,  S1-S2 FOR SPINAL STENOSIS FORAMINOTOMY L5, S1, S2 ROOTS ON LEFT FOR FORAMINAL STENOSIS, AND DECOMPRESSION L5-S1, S1-S2 ACCORDING TO LABELING OF [place] X-RAYS;  Surgeon: Carol Maudlin, MD;  Location: [facility] ORS;  Service: Orthopedics;  Laterality: Left;  . Cardiac catheterization N/A 04/20/2015    Procedure: Left Heart Cath and Coronary Angiography;  Surgeon: Carol Crome, MD;  Location: [facility] LAB;  Service: Cardiovascular;  Laterality: N/A;    Family History  Problem Relation Age of Onset  . Hypertension Mother   . Diabetes Mother   . Heart attack Mother 64  . Heart disease Father   . Diabetes Brother   . Heart disease Brother   . Cancer Sister   . Emphysema Father   . Stroke Neg Hx     Social History:  reports that she has never smoked. She has never used smokeless tobacco. She reports that she does not drink alcohol or use illicit  drugs.  Allergies:  Allergies  Allergen Reactions  . Contrast Media [Iodinated Diagnostic Agents] Other (See Comments)    unknown  . Cortizone-10 [Hydrocortisone] Other (See Comments)    unknown  . Lipitor [Atorvastatin]     Muscle aches- severe  . Zocor [Simvastatin]     Muscle aches- severe    Medications:  I have reviewed the patient's current medications. Prior to Admission:  Prescriptions prior to admission  Medication Sig Dispense Refill Last Dose  . allopurinol (ZYLOPRIM) 300 MG tablet Take 300 mg by mouth every morning.    04/19/2015 at Unknown time  . aspirin EC 81 MG EC tablet Take 1 tablet (81 mg total) by mouth daily. 30 tablet 3 04/19/2015 at Unknown  time  . Biotin 5000 MCG CAPS Take 1 capsule by mouth every morning.    04/19/2015 at Unknown time  . carvedilol (COREG) 3.125 MG tablet Take 1 tablet (3.125 mg total) by mouth 2 (two) times daily with a meal. 60 tablet 11 04/19/2015 at 8a  . clopidogrel (PLAVIX) 75 MG tablet TAKE 1 TABLET BY MOUTH ONCE DAILY. 30 tablet 3 04/19/2015 at Unknown time  . colchicine 0.6 MG tablet Take 0.6 mg by mouth daily as needed (gout flare up.).    Past Week at Unknown time  . famotidine (PEPCID AC) 10 MG tablet Take 1 tablet (10 mg total) by mouth 2 (two) times daily. (Patient taking differently: Take 10 mg by mouth 2 (two) times daily as needed for heartburn or indigestion. ) 60 tablet 6 04/19/2015 at Unknown time  . furosemide (LASIX) 40 MG tablet Take 40 mg by mouth daily.    04/19/2015 at Unknown time  . insulin NPH-regular Human (HUMULIN 70/30) (70-30) 100 UNIT/ML injection Inject 32 Units into the skin daily with breakfast. And syringes 1/day 10 mL 11 04/19/2015 at Unknown time  . isosorbide mononitrate (IMDUR) 30 MG 24 hr tablet Take 1 tablet (30 mg total) by mouth 2 (two) times daily. 60 tablet 6 04/19/2015 at Unknown time  . levothyroxine (SYNTHROID, LEVOTHROID) 50 MCG tablet Take 50 mcg by mouth every morning.    04/19/2015 at  Unknown time  . lisinopril (PRINIVIL,ZESTRIL) 2.5 MG tablet Take 1 tablet (2.5 mg total) by mouth daily. (Patient taking differently: Take 2.5 mg by mouth every morning. ) 30 tablet 0 04/19/2015 at Unknown time  . oxyCODONE-acetaminophen (PERCOCET/ROXICET) 5-325 MG per tablet Take 1-2 tablets by mouth every 4 (four) hours as needed for moderate pain. 80 tablet 0 04/19/2015 at Unknown time  . pantoprazole (PROTONIX) 40 MG tablet TAKE 1 TABLET BY MOUTH ONCE DAILY. 30 tablet 3 04/19/2015 at Unknown time  . PROAIR HFA 108 (90 BASE) MCG/ACT inhaler Inhale 1-2 puffs into the lungs every 4 (four) hours as needed for wheezing or shortness of breath. For breathing   Past Month at Unknown time  . traMADol (ULTRAM) 50 MG tablet Take 50 mg by mouth daily as needed for moderate pain.    Past Week at Unknown time  . nitroGLYCERIN (NITROSTAT) 0.4 MG SL tablet Place 0.4 mg under the tongue every 5 (five) minutes as needed for chest pain.   unknown   Scheduled: . allopurinol  300 mg Oral q morning - 10a  . aspirin EC  81 mg Oral Daily  . carvedilol  3.125 mg Oral BID WC  . diphenhydrAMINE  25 mg Intravenous Once  . famotidine  20 mg Oral QHS  . furosemide  40 mg Oral Daily  . insulin aspart  0-20 Units Subcutaneous TID WC  . insulin aspart  0-5 Units Subcutaneous QHS  . insulin aspart protamine- aspart  32 Units Subcutaneous Q breakfast  . levothyroxine  50 mcg Oral QAC breakfast  . multivitamin with minerals  1 tablet Oral Daily  . pantoprazole  40 mg Oral BID  . sodium chloride  3 mL Intravenous Q12H   Continuous: . heparin 1,150 Units/hr (04/20/15 2304)  . nitroGLYCERIN 20 mcg/min (04/21/15 0845)   MVH:QIONGE chloride, acetaminophen, colchicine, nitroGLYCERIN, ondansetron (ZOFRAN) IV, sodium chloride  Results for orders placed or performed during the hospital encounter of 04/19/15 (from the past 48 hour(s))  Comprehensive metabolic panel     Status: Abnormal   Collection Time: 04/19/15  5:35  PM   Result Value Ref Range   Sodium 141 135 - 145 mmol/L   Potassium 4.7 3.5 - 5.1 mmol/L   Chloride 106 101 - 111 mmol/L   CO2 29 22 - 32 mmol/L   Glucose, Bld 81 65 - 99 mg/dL   BUN 29 (H) 6 - 20 mg/dL   Creatinine, Ser 1.17 (H) 0.44 - 1.00 mg/dL   Calcium 9.6 8.9 - 10.3 mg/dL   Total Protein 7.0 6.5 - 8.1 g/dL   Albumin 3.9 3.5 - 5.0 g/dL   AST 19 15 - 41 U/L   ALT 15 14 - 54 U/L   Alkaline Phosphatase 114 38 - 126 U/L   Total Bilirubin 0.4 0.3 - 1.2 mg/dL   GFR calc non Af Amer 44 (L) >60 mL/min   GFR calc Af Amer 51 (L) >60 mL/min    Comment: (NOTE) The eGFR has been calculated using the CKD EPI equation. This calculation has not been validated in all clinical situations. eGFR's persistently <60 mL/min signify possible Chronic Kidney Disease.    Anion gap 6 5 - 15  TSH     Status: Abnormal   Collection Time: 04/19/15  5:35 PM  Result Value Ref Range   TSH 7.021 (H) 0.350 - 4.500 uIU/mL  Troponin I     Status: None   Collection Time: 04/19/15  5:35 PM  Result Value Ref Range   Troponin I <0.03 <0.031 ng/mL    Comment:        NO INDICATION OF MYOCARDIAL INJURY.   Brain natriuretic peptide     Status: None   Collection Time: 04/19/15  5:35 PM  Result Value Ref Range   B Natriuretic Peptide 36.6 0.0 - 100.0 pg/mL  CBC WITH DIFFERENTIAL     Status: Abnormal   Collection Time: 04/19/15  5:35 PM  Result Value Ref Range   WBC 5.8 4.0 - 10.5 K/uL   RBC 3.67 (L) 3.87 - 5.11 MIL/uL   Hemoglobin 10.5 (L) 12.0 - 15.0 g/dL   HCT 33.7 (L) 36.0 - 46.0 %   MCV 91.8 78.0 - 100.0 fL   MCH 28.6 26.0 - 34.0 pg   MCHC 31.2 30.0 - 36.0 g/dL   RDW 14.6 11.5 - 15.5 %   Platelets 265 150 - 400 K/uL   Neutrophils Relative % 55 %   Neutro Abs 3.1 1.7 - 7.7 K/uL   Lymphocytes Relative 34 %   Lymphs Abs 2.0 0.7 - 4.0 K/uL   Monocytes Relative 8 %   Monocytes Absolute 0.5 0.1 - 1.0 K/uL   Eosinophils Relative 2 %   Eosinophils Absolute 0.1 0.0 - 0.7 K/uL   Basophils Relative 1 %    Basophils Absolute 0.0 0.0 - 0.1 K/uL  Protime-INR     Status: None   Collection Time: 04/19/15  5:35 PM  Result Value Ref Range   Prothrombin Time 13.9 11.6 - 15.2 seconds   INR 1.05 0.00 - 1.49  APTT     Status: None   Collection Time: 04/19/15  5:35 PM  Result Value Ref Range   aPTT 33 24 - 37 seconds  Glucose, capillary     Status: None   Collection Time: 04/19/15  9:34 PM  Result Value Ref Range   Glucose-Capillary 65 65 - 99 mg/dL   Comment 1 Notify RN    Comment 2 Document in Chart   Troponin I     Status: None   Collection Time: 04/19/15 10:04  PM  Result Value Ref Range   Troponin I <0.03 <0.031 ng/mL    Comment:        NO INDICATION OF MYOCARDIAL INJURY.   Glucose, capillary     Status: Abnormal   Collection Time: 04/19/15 10:28 PM  Result Value Ref Range   Glucose-Capillary 115 (H) 65 - 99 mg/dL   Comment 1 Notify RN    Comment 2 Document in Chart   Troponin I     Status: None   Collection Time: 04/20/15  4:32 AM  Result Value Ref Range   Troponin I <0.03 <0.031 ng/mL    Comment:        NO INDICATION OF MYOCARDIAL INJURY.   Glucose, capillary     Status: Abnormal   Collection Time: 04/20/15  6:24 AM  Result Value Ref Range   Glucose-Capillary 141 (H) 65 - 99 mg/dL   Comment 1 Notify RN    Comment 2 Document in Chart   Glucose, capillary     Status: None   Collection Time: 04/20/15 11:20 AM  Result Value Ref Range   Glucose-Capillary 82 65 - 99 mg/dL  Glucose, capillary     Status: None   Collection Time: 04/20/15  2:20 PM  Result Value Ref Range   Glucose-Capillary 75 65 - 99 mg/dL  Glucose, capillary     Status: Abnormal   Collection Time: 04/20/15  3:58 PM  Result Value Ref Range   Glucose-Capillary 129 (H) 65 - 99 mg/dL   Comment 1 Capillary Specimen   MRSA PCR Screening     Status: None   Collection Time: 04/20/15  4:41 PM  Result Value Ref Range   MRSA by PCR NEGATIVE NEGATIVE    Comment:        The GeneXpert MRSA Assay (FDA approved for  NASAL specimens only), is one component of a comprehensive MRSA colonization surveillance program. It is not intended to diagnose MRSA infection nor to guide or monitor treatment for MRSA infections.   Glucose, capillary     Status: Abnormal   Collection Time: 04/20/15  9:48 PM  Result Value Ref Range   Glucose-Capillary 209 (H) 65 - 99 mg/dL   Comment 1 Capillary Specimen   CBC     Status: Abnormal   Collection Time: 04/21/15  6:57 AM  Result Value Ref Range   WBC 5.6 4.0 - 10.5 K/uL   RBC 3.79 (L) 3.87 - 5.11 MIL/uL   Hemoglobin 11.4 (L) 12.0 - 15.0 g/dL   HCT 34.9 (L) 36.0 - 46.0 %   MCV 92.1 78.0 - 100.0 fL   MCH 30.1 26.0 - 34.0 pg   MCHC 32.7 30.0 - 36.0 g/dL   RDW 14.7 11.5 - 15.5 %   Platelets 236 150 - 400 K/uL  Heparin level (unfractionated)     Status: None   Collection Time: 04/21/15  7:00 AM  Result Value Ref Range   Heparin Unfractionated 0.48 0.30 - 0.70 IU/mL    Comment:        IF HEPARIN RESULTS ARE BELOW EXPECTED VALUES, AND PATIENT DOSAGE HAS BEEN CONFIRMED, SUGGEST FOLLOW UP TESTING OF ANTITHROMBIN III LEVELS.   Glucose, capillary     Status: Abnormal   Collection Time: 04/21/15  8:47 AM  Result Value Ref Range   Glucose-Capillary 146 (H) 65 - 99 mg/dL   Comment 1 Capillary Specimen   Heparin level (unfractionated)     Status: None   Collection Time: 04/21/15 12:00 PM  Result  Value Ref Range   Heparin Unfractionated 0.68 0.30 - 0.70 IU/mL    Comment:        IF HEPARIN RESULTS ARE BELOW EXPECTED VALUES, AND PATIENT DOSAGE HAS BEEN CONFIRMED, SUGGEST FOLLOW UP TESTING OF ANTITHROMBIN III LEVELS.   Glucose, capillary     Status: Abnormal   Collection Time: 04/21/15 12:12 PM  Result Value Ref Range   Glucose-Capillary 247 (H) 65 - 99 mg/dL    X-ray Chest Pa And Lateral  04/19/2015  CLINICAL DATA:  79 year old female with chest pressure for the past 3 days. Cannot lie flat while sleeping. Shortness of breath. EXAM: CHEST  2 VIEW COMPARISON:   Chest x-ray 08/19/2014. FINDINGS: Lung volumes are normal. No consolidative airspace disease. No pleural effusions. No pneumothorax. No pulmonary nodule or mass noted. Pulmonary vasculature and the cardiomediastinal silhouette are within normal limits. Atherosclerosis in the thoracic aorta. Surgical clips project over the right upper quadrant of the abdomen, likely from prior cholecystectomy. IMPRESSION: 1.  No radiographic evidence of acute cardiopulmonary disease. 2. Atherosclerosis. Electronically Signed   By: Vinnie Langton M.D.   On: 04/19/2015 18:11   Ct Coronary Morp W/cta Cor W/score W/ca W/cm &/or Wo/cm  04/21/2015  ADDENDUM REPORT: 04/21/2015 10:03 CLINICAL DATA:  Chest pain?  LM stenosis EXAM: Cardiac CTA MEDICATIONS: IV nitro  and lopressor 10 mg TECHNIQUE: The patient was scanned on a Philips 163 slice scanner. Gantry rotation speed was 270 msecs. Collimation was .81m. A 100 kV prospective scan was triggered in the descending thoracic aorta at 111 HU's with 5% padding centered around 78% of the R-R interval. Average HR during the scan was bpm. The 3D data set was interpreted on a dedicated work station using MPR, MIP and VRT modes. A total of 80cc of contrast was used. FINDINGS: Non-cardiac: [facility]. No significant findings on limited lung and soft tissue windows. Coronary Arteries: Right dominant with no anomalies The left main coronary artery arose from the left cusp. There was a large area of calcium at the ostium and proximal portion of the left main. The mid to distal LM had a soft plaque with high risk Feature of lucent core. The ostial LAD was also heavily calcified. The degree of stenosis in the LM appears to be in the 50-75% range and with both calcium and soft plaque with "hazy" appearance On angiography this would appear to be surgical disease Images also reviewed by Dr NMeda CoffeeIMPRESSION: 1) Significant mixed plaque in the LM coronary artery  including soft plaque with lucent core suggesting high risk feature and correlating with "hazy" appearance on cath. Stenosis 50-75% And with morphologic features this would be considered surgical disease PJenkins RougeElectronically Signed   By: PJenkins RougeM.D.   On: 04/21/2015 10:03  04/21/2015  EXAM: OVER-READ INTERPRETATION  CT CHEST The following report is an over-read performed by radiologist Dr. JSamara [facility]GAc[facility], PFranklin Lakeson 04/21/2015. This over-read does not include interpretation of cardiac or coronary anatomy or pathology. The coronary calcium score/coronary CTA interpretation by the cardiologist is attached. COMPARISON:  02/24/2014 and 05/09/2009 chest CT studies. FINDINGS: Visualized portions of the great vessels are normal caliber. No adenopathy in the visualized chest. Small to moderate hiatal hernia. No pneumothorax or pleural effusion in the visualized chest. There is a solitary 3 mm ground-glass pulmonary nodule in the right middle lobe (series 204/image 11), not definitely seen on prior chest CT studies. Otherwise no acute consolidative airspace disease or significant  pulmonary nodules in the visualized chest. Status post cholecystectomy. Granulomatous calcifications are noted in the nonenlarged spleen. Moderate degenerative changes in the visualized thoracic spine. No suspicious focal osseous lesions. IMPRESSION: 1. Solitary 3 mm right middle lobe ground-glass pulmonary nodule. No further follow-up is required. This recommendation follows the consensus statement: Recommendations for the Management of Subsolid Pulmonary Nodules Detected at CT: A Statement from the [employer] as published in Radiology 2013; 266:304-317. 2. Small to moderate hiatal hernia. Electronically Signed: By: Ilona Sorrel M.D. On: 04/21/2015 09:16    Review of Systems  Constitutional: Positive for malaise/fatigue. Negative for fever and chills.       Weight gain from inactivity.  HENT: Negative.    Eyes: Negative.   Respiratory: Positive for shortness of breath. Negative for cough.   Cardiovascular: Positive for chest pain and leg swelling. Negative for palpitations, orthopnea and PND.  Gastrointestinal: Negative.   Genitourinary: Negative.   Musculoskeletal: Positive for back pain.  Skin: Negative.   Neurological:       Pain down left leg after spine surgery   Endo/Heme/Allergies: Negative.   Psychiatric/Behavioral: Negative.    Blood pressure 175/61, pulse 75, temperature 98.6 F (37 C), temperature source Oral, resp. rate 17, height 5' 7" (1.702 m), weight 99.066 kg (218 lb 6.4 oz), SpO2 97 %. Physical Exam  Constitutional: She is oriented to person, place, and time.  Elderly, obese woman in no distress  HENT:  Head: Normocephalic and atraumatic.  Mouth/Throat: Oropharynx is clear and moist.  Eyes: EOM are normal. Pupils are equal, round, and reactive to light.  Neck: Normal range of motion. Neck supple. No JVD present. No thyromegaly present.  Cardiovascular: Normal rate, regular rhythm and normal heart sounds.   No murmur heard. Diminished pedal pulses bilat  Respiratory: Effort normal and breath sounds normal. No respiratory distress. She has no rales.  GI: Soft. Bowel sounds are normal. She exhibits no distension and no mass. There is no tenderness.  Musculoskeletal: Normal range of motion. She exhibits edema.  Lymphadenopathy:    She has no cervical adenopathy.  Neurological: She is alert and oriented to person, place, and time. She has normal strength. No cranial nerve deficit or sensory deficit.  Skin: Skin is warm and dry.  Psychiatric: She has a normal mood and affect.   Procedures    Left Heart Cath and Coronary Angiography    Conclusion    1. Mid RCA to Dist RCA lesion, 70% stenosed. 2. 1st RPLB lesion, 70% stenosed. 3. 1st Mrg-1 lesion, 50% stenosed. 4. Dist Cx lesion, 40% stenosed. 5. Prox LAD to Dist LAD lesion, 25% stenosed. 6. LM lesion, 50%  stenosed.   Probable mid to distal left main disease. Rule out contrast streaming.  Patent first obtuse marginal stents placed one year ago. Irregularities noted in the mid circumflex.  New mid RCA 50-70% stenosis. 50% left ventricular branch at the site of previous PTCA.  Overall normal left ventricular function. Ejection fraction 50%.   RECOMMENDATIONS:   Discontinue Plavix  TCTS consultation  Coronary CT angiogram to specifically evaluate the mid to distal left main.     Indications    Chest pain at rest [R07.9 (ICD-10-CM)]   Coronary artery disease involving native coronary artery of native heart with angina pectoris ([place]) [I25.119 (ICD-10-CM)]    Technique and Indications    The left radial area was sterilely prepped and draped. Intravenous sedation with Versed and fentanyl was administered. 1% Xylocaine was infiltrated to achieve local  analgesia. A double wall stick with an angiocath was utilized to obtain intra-arterial access. The modified Seldinger technique was used to place a 10F " Slender" sheath in the right radial artery. Coronary angiography was done using 5 F catheters. Right coronary angiography was performed with a JR4. Left ventriculography was done using the JR 4 catheter and hand injection. Left coronary angiography was performed with a JL 3.5 cm. with left coronary angiography, ventricularization of the pressure waveform was noted with engagement. We changed to a 5 French 3.0 EBU guide catheter with the same result. The mid to distal left main is hazy and potentially contains obstruction up to 50%. I am concerned that we may not have coaxial engagement and may have streaming which causes the area to appear more severe.  Hemostasis was achieved in the left radial with a wrist band.  Estimated blood loss <50 mL. There were no immediate complications during the procedure.    Coronary Findings    Dominance: Co-dominant   Left Main   . LM lesion, 50%  stenosed. The lesion is type C eccentric.     Left Anterior Descending   . Prox LAD to Dist LAD lesion, 25% stenosed. diffuse eccentric.     Ramus Intermedius  . Vessel is small.     Left Circumflex   . Dist Cx lesion, 40% stenosed.   . First Obtuse Marginal Branch   . 1st Mrg-1 lesion, 50% stenosed. eccentric.   . 1st Mrg-2 lesion, 0% stenosed. Previously placed 1st Mrg-2 stent (unknown type) is patent.   . Third Obtuse Marginal Branch   The vessel is small in size.     Right Coronary Artery   . Mid RCA to Dist RCA lesion, 70% stenosed. eccentric.   Marland Kitchen Acute Marginal Branch   The vessel is small in size.   . First Right Posterolateral   The vessel is small in size.   . 1st RPLB lesion, 70% stenosed.      Wall Motion                 Coronary Diagrams    Diagnostic Diagram            Implants    Name ID Temporary Type Supply   No information to display    PACS Images    Show images for Cardiac catheterization     Link to Procedure Log    Procedure Log      Hemo Data       Most Recent Value   AO Systolic Pressure  564 mmHg   AO Diastolic Pressure  64 mmHg   AO Mean  332 mmHg   LV Systolic Pressure  951 mmHg   LV Diastolic Pressure  5 mmHg   LV EDP  13 mmHg   Arterial Occlusion Pressure Extended Systolic Pressure  884 mmHg   Arterial Occlusion Pressure Extended Diastolic Pressure  71 mmHg   Arterial Occlusion Pressure Extended Mean Pressure  114 mmHg   Left Ventricular Apex Extended Systolic Pressure  166 mmHg   Left Ventricular Apex Extended Diastolic Pressure  6 mmHg   Left Ventricular Apex Extended EDP Pressure  12 mmHg    Order-Level Documents:    There are no order-level documents.    Encounter-Level Documents - 04/19/15:      Scan on 04/21/2015 9:02 AM by Provider Default, MDScan on 04/21/2015 9:02 AM by Provider Default, MD     Electronic signature on 04/19/2015 4:03 PM  Signed    Electronically  signed by Carol Crome, MD on 04/20/15 at 50 EDT   Josue Hector, MD  Thu Apr 21, 2015 10:05:56 AM EDT     ADDENDUM REPORT: 04/21/2015 10:03 CLINICAL DATA: Chest pain? LM stenosis EXAM: Cardiac CTA MEDICATIONS: IV nitro and lopressor 10 mg TECHNIQUE: The patient was scanned on a Philips 109 slice scanner. Gantry rotation speed was 270 msecs. Collimation was .36m. A 100 kV prospective scan was triggered in the descending thoracic aorta at 111 HU's with 5% padding centered around 78% of the R-R interval. Average HR during the scan was bpm. The 3D data set was interpreted on a dedicated work station using MPR, MIP and VRT modes. A total of 80cc of contrast was used. FINDINGS: Non-cardiac: [facility] No significant findings on limited lung and soft tissue windows. Coronary Arteries: Right dominant with no anomalies The left main coronary artery arose from the left cusp. There was a large area of calcium at the ostium and proximal portion of the left main. The mid to distal LM had a soft plaque with high risk Feature of lucent core. The ostial LAD was also heavily calcified. The degree of stenosis in the LM appears to be in the 50-75% range and with both calcium and soft plaque with "hazy" appearance On angiography this would appear to be surgical disease Images also reviewed by Dr NMeda CoffeeIMPRESSION: 1) Significant mixed plaque in the LM coronary artery including soft plaque with lucent core suggesting high risk feature and correlating with "hazy" appearance on cath. Stenosis 50-75% And with morphologic features this would be considered surgical disease PJenkins RougeElectronically Signed  By: PJenkins RougeM.D.  On: 04/21/2015 10:03     Study Result     EXAM: OVER-READ INTERPRETATION CT CHEST  The following report is an over-read performed by radiologist Dr. [facility] on 04/21/2015. This  over-read does not include interpretation of cardiac or coronary anatomy or pathology. The coronary calcium score/coronary CTA interpretation by the cardiologist is attached.  COMPARISON: 02/24/2014 and 05/09/2009 chest CT studies.  FINDINGS: Visualized portions of the great vessels are normal caliber. No adenopathy in the visualized chest. Small to moderate hiatal hernia. No pneumothorax or pleural effusion in the visualized chest. There is a solitary 3 mm ground-glass pulmonary nodule in the right middle lobe (series 204/image 11), not definitely seen on prior chest CT studies. Otherwise no acute consolidative airspace disease or significant pulmonary nodules in the visualized chest. Status post cholecystectomy. Granulomatous calcifications are noted in the nonenlarged spleen. Moderate degenerative changes in the visualized thoracic spine. No suspicious focal osseous lesions.  IMPRESSION: 1. Solitary 3 mm right middle lobe ground-glass pulmonary nodule. No further follow-up is required. This recommendation follows the consensus statement: Recommendations for the Management of Subsolid Pulmonary Nodules Detected at CT: A Statement from the [employer] published in Radiology 2013; 266:304-317. 2. Small to moderate hiatal hernia.  Electronically Signed: By: JIlona SorrelM.D. On: 04/21/2015 09:16        Assessment/Plan:  I have personally reviewed all of her previous cardiac caths and her cardiac CT from today. This 79year old poorly controlled diabetic has progressive exertional angina that is CCS class 3-4 and has a hazy mid to distal LM stenosis that has soft plaque with a lucent core and 50-70% stenosis suggesting that it is a high risk lesion, as well as an 80% mid RCA stenosis. I agree  that CABG is the best treatment for her followed by cardiovascular risk factor reduction. She has been on Plavix and this has been stopped. She will have to wait until Monday  for surgery. I discussed the operative procedure with the patient and family including alternatives, benefits and risks; including but not limited to bleeding, blood transfusion, infection, stroke, myocardial infarction, graft failure, heart block requiring a permanent pacemaker, organ dysfunction, and death.  [name] understands and agrees to proceed.     Gaye Pollack 04/21/2015, 12:48 PM

## 2015-04-21 NOTE — Progress Notes (Signed)
Pre-op Cardiac Surgery  Carotid Findings: Bilateral: No significant (1-39%) ICA stenosis. Antegrade vertebral flow. Right ECA >50% stenosis.   ~~Limb Doppler studies pending.   Landry Mellow, RDMS, RVT 04/21/2015

## 2015-04-21 NOTE — Progress Notes (Signed)
Discussed sternal precautions, IS (and practiced), mobility and d/c planning. Able to inspire 2000 mL. Pt lives alone and would be interested in [facility] at d/c. Gave OHS booklet and guideline and video to watch. Pt and daughters voiced understanding. Assisted pt to BR. Denied chest pressure but did have belching like she has been having. Will not ambulate preop unless otherwise indicated due to high risk LM.  Marina, ACSM 12:23 PM 04/21/2015

## 2015-04-22 ENCOUNTER — Inpatient Hospital Stay (HOSPITAL_COMMUNITY): Payer: Medicare Other

## 2015-04-22 DIAGNOSIS — I2510 Atherosclerotic heart disease of native coronary artery without angina pectoris: Secondary | ICD-10-CM

## 2015-04-22 DIAGNOSIS — I255 Ischemic cardiomyopathy: Secondary | ICD-10-CM

## 2015-04-22 LAB — SPIROMETRY WITH GRAPH
FEF 25-75 POST: 1.72 L/s
FEF 25-75 PRE: 1.56 L/s
FEF2575-%Change-Post: 10 %
FEF2575-%Pred-Post: 99 %
FEF2575-%Pred-Pre: 90 %
FEV1-%Change-Post: 3 %
FEV1-%PRED-PRE: 85 %
FEV1-%Pred-Post: 88 %
FEV1-POST: 2.07 L
FEV1-Pre: 2 L
FEV1FVC-%Change-Post: 1 %
FEV1FVC-%PRED-PRE: 98 %
FEV6-%CHANGE-POST: 4 %
FEV6-%PRED-POST: 94 %
FEV6-%Pred-Pre: 89 %
FEV6-POST: 2.78 L
FEV6-Pre: 2.65 L
FEV6FVC-%CHANGE-POST: 2 %
FEV6FVC-%PRED-POST: 105 %
FEV6FVC-%Pred-Pre: 102 %
FVC-%Change-Post: 2 %
FVC-%PRED-POST: 89 %
FVC-%Pred-Pre: 87 %
FVC-Post: 2.79 L
FVC-Pre: 2.73 L
PRE FEV1/FVC RATIO: 73 %
PRE FEV6/FVC RATIO: 97 %
Post FEV1/FVC ratio: 74 %
Post FEV6/FVC ratio: 100 %

## 2015-04-22 LAB — GLUCOSE, CAPILLARY
GLUCOSE-CAPILLARY: 126 mg/dL — AB (ref 65–99)
Glucose-Capillary: 201 mg/dL — ABNORMAL HIGH (ref 65–99)
Glucose-Capillary: 207 mg/dL — ABNORMAL HIGH (ref 65–99)
Glucose-Capillary: 49 mg/dL — ABNORMAL LOW (ref 65–99)
Glucose-Capillary: 73 mg/dL (ref 65–99)
Glucose-Capillary: 173 mg/dL — ABNORMAL HIGH (ref 65–99)

## 2015-04-22 LAB — CBC
HCT: 28.6 % — ABNORMAL LOW (ref 36.0–46.0)
Hemoglobin: 9.3 g/dL — ABNORMAL LOW (ref 12.0–15.0)
MCH: 29 pg (ref 26.0–34.0)
MCHC: 32.5 g/dL (ref 30.0–36.0)
MCV: 89.1 fL (ref 78.0–100.0)
PLATELETS: 232 10*3/uL (ref 150–400)
RBC: 3.21 MIL/uL — AB (ref 3.87–5.11)
RDW: 14.2 % (ref 11.5–15.5)
WBC: 8.5 10*3/uL (ref 4.0–10.5)

## 2015-04-22 LAB — HEPARIN LEVEL (UNFRACTIONATED): HEPARIN UNFRACTIONATED: 0.47 [IU]/mL (ref 0.30–0.70)

## 2015-04-22 MED ORDER — DEXTROSE 50 % IV SOLN
INTRAVENOUS | Status: AC
  Administered 2015-04-23: 25 mL
  Filled 2015-04-22: qty 50

## 2015-04-22 MED ORDER — AMLODIPINE BESYLATE 2.5 MG PO TABS
2.5000 mg | ORAL_TABLET | Freq: Every day | ORAL | Status: DC
Start: 2015-04-22 — End: 2015-04-25
  Administered 2015-04-22 – 2015-04-24 (×3): 2.5 mg via ORAL
  Filled 2015-04-22 (×3): qty 1

## 2015-04-22 MED ORDER — CARVEDILOL 6.25 MG PO TABS
6.2500 mg | ORAL_TABLET | Freq: Two times a day (BID) | ORAL | Status: DC
  Administered 2015-04-22 – 2015-04-25 (×6): 6.25 mg via ORAL
  Filled 2015-04-22 (×6): qty 1

## 2015-04-22 MED ORDER — ALBUTEROL SULFATE (2.5 MG/3ML) 0.083% IN NEBU
2.5000 mg | INHALATION_SOLUTION | Freq: Once | RESPIRATORY_TRACT | Status: AC
  Administered 2015-04-22: 2.5 mg via RESPIRATORY_TRACT

## 2015-04-22 MED ORDER — TRAMADOL HCL 50 MG PO TABS
50.0000 mg | ORAL_TABLET | Freq: Two times a day (BID) | ORAL | Status: DC
  Administered 2015-04-22 – 2015-04-24 (×6): 50 mg via ORAL
  Filled 2015-04-22 (×6): qty 1

## 2015-04-22 NOTE — Progress Notes (Signed)
ANTICOAGULATION CONSULT NOTE - Follow Up Consult  Pharmacy Consult for Heparin Indication: chest pain/ACS, CAD, for OHS on 04/25/15  Allergies  Allergen Reactions  . Contrast Media [Iodinated Diagnostic Agents] Other (See Comments)    unknown  . Cortizone-10 [Hydrocortisone] Other (See Comments)    unknown  . Lipitor [Atorvastatin]     Muscle aches- severe  . Zocor [Simvastatin]     Muscle aches- severe    Patient Measurements: Height: 5\' 7"  (170.2 cm) Weight: 218 lb 6.4 oz (99.066 kg) (a scale) IBW/kg (Calculated) : 61.6 Heparin Dosing Weight: 84 kg  Vital Signs: Temp: 98.1 F (36.7 C) (10/28 0819) Temp Source: Oral (10/28 0819) BP: 153/65 mmHg (10/28 0819) Pulse Rate: 70 (10/28 0400)  Labs:  Recent Labs  04/19/15 1735 04/19/15 2204 04/20/15 0432 04/21/15 0657 04/21/15 0700 04/21/15 1200 04/22/15 0415  HGB 10.5*  --   --  11.4*  --   --  9.3*  HCT 33.7*  --   --  34.9*  --   --  28.6*  PLT 265  --   --  236  --   --  232  APTT 33  --   --   --   --   --   --   LABPROT 13.9  --   --   --   --   --   --   INR 1.05  --   --   --   --   --   --   HEPARINUNFRC  --   --   --   --  0.48 0.68 0.47  CREATININE 1.17*  --   --   --   --   --   --   TROPONINI <0.03 <0.03 <0.03  --   --   --   --     Estimated Creatinine Clearance: 48.7 mL/min (by C-G formula based on Cr of 1.17).  Assessment:   S/p cardiac cath 10/26.  For OHS on 04/25/15 after Plavix washout. Last Plavix dose 10/26.   Heparin level is therapeutic (0.47) on 1050 units/hr.   Hgb has trended down some. Platelet count stable. No bleeding reported.  Goal of Therapy:  Heparin level 0.3-0.7 units/ml Monitor platelets by anticoagulation protocol: Yes   Plan:   Continue Heparin drip at 1050 units/hr.  Daily heparin level and CBC while on heparin.  Bmet in am.   Monitor for any bleeding  Arty Baumgartner, [place] Pager: [phone] 04/22/2015,10:52 AM

## 2015-04-22 NOTE — Progress Notes (Addendum)
Patient Profile: 79 y.o. female with a hx of CAD, prior ischemic cardiomyopathy with improvement in EF post PCI, diabetes, HTN, HL, asthma and hypothyroidism admitted from the office for ischemic eval given chest pain syndrome similar to her previous angina. Cardiac enzymes are negative x 3.   Subjective: The patient is having on and off chest pain and palpitations. On iv heparin and iv NTG.  She is also complaining of sciatica type of pain.  Objective: Vital signs in last 24 hours: Temp:  [98 F (36.7 C)-98.6 F (37 C)] 98.1 F (36.7 C) (10/28 0819) Pulse Rate:  [70-82] 70 (10/28 0400) Resp:  [13-18] 15 (10/28 0819) BP: (123-175)/(47-65) 153/65 mmHg (10/28 0819) SpO2:  [93 %-99 %] 98 % (10/28 0819) Last BM Date: 04/19/15  Intake/Output from previous day: 10/27 0701 - 10/28 0700 In: 1398 [P.O.:1000; I.V.:398] Out: 2025 [Urine:2025] Intake/Output this shift: Total I/O In: 426 [P.O.:360; I.V.:66] Out: -   Medications . allopurinol  300 mg Oral q morning - 10a  . aspirin EC  81 mg Oral Daily  . carvedilol  3.125 mg Oral BID WC  . famotidine  20 mg Oral QHS  . furosemide  40 mg Oral Daily  . insulin aspart  0-20 Units Subcutaneous TID WC  . insulin aspart  0-5 Units Subcutaneous QHS  . insulin aspart protamine- aspart  32 Units Subcutaneous Q breakfast  . levothyroxine  50 mcg Oral QAC breakfast  . multivitamin with minerals  1 tablet Oral Daily  . pantoprazole  40 mg Oral BID  . sodium chloride  3 mL Intravenous Q12H   . heparin 1,050 Units/hr (04/22/15 0800)  . nitroGLYCERIN 20 mcg/min (04/22/15 0800)    PE: General appearance: alert, cooperative, no distress and moderately obese Neck: + left carotid bruit and no JVD Lungs: clear to auscultation bilaterally Heart: regular rate and rhythm, S1, S2 normal, no murmur, click, rub or gallop Extremities: no LEE Pulses: 2+ and symmetric Skin: warm and dry Neurologic: Grossly normal  Lab Results:   Recent Labs  04/19/15 1735 04/21/15 0657 04/22/15 0415  WBC 5.8 5.6 8.5  HGB 10.5* 11.4* 9.3*  HCT 33.7* 34.9* 28.6*  PLT 265 236 232   BMET  Recent Labs  04/19/15 1735  NA 141  K 4.7  CL 106  CO2 29  GLUCOSE 81  BUN 29*  CREATININE 1.17*  CALCIUM 9.6   PT/INR  Recent Labs  04/19/15 1735  LABPROT 13.9  INR 1.05   Cardiac Panel (last 3 results)  Recent Labs  04/19/15 1735 04/19/15 2204 04/20/15 0432  TROPONINI <0.03 <0.03 <0.03    Assessment/Plan   Principal Problem:   Unstable angina (HCC) Active Problems:   Essential hypertension   Hyperlipidemia with target LDL less than 70   Ischemic cardiomyopathy - with near resolution of LVEF post MI   Chronic diastolic CHF (congestive heart failure) (HCC)   GERD (gastroesophageal reflux disease)  1. CAD: admitted with unstable angina, cathed yesterday with findings of   Mid RCA to Dist RCA lesion, 70% stenosed.  1st RPLB lesion, 70% stenosed.  1st Mrg-1 lesion, 50% stenosed.  Dist Cx lesion, 40% stenosed.  Prox LAD to Dist LAD lesion, 25% stenosed.  LM lesion, 50% stenosed.  Cardiac CT this am showed moderate LM disease with high risk mixed plaque. She is scheduled for a CABG with Dr Cyndia Bent on Monday. We will d/c Plavix.  2. Ischemic Cardiomyopathy: Prior EF 25-30% in 8/15. EF has returned to  normal by recent echo. Continue beta-blocker, hold Lisinopril with elevated Crea post cardiac cath.  3. Chronic Diastolic CHF: stable. BNP yesterday was normal at 36.6. Continue PO lasix.   4. HTN: Moderately elevated. Increase coreg to 6.25 and add amlodipine, avoid ACEI as Crea high post cath.  5. Hyperlipidemia: Intol of statin.   6. Diabetes Mellitus: Continue Novolin 70/30. Cover with SSI while NPO.  7. GERD: Increase Protonix to bid and change Pepcid to 20 mg QHS.   8. L Carotid Bruit: Arrange Carotid US to evaluate after DC from the hospital.    Carol [name] 04/22/2015

## 2015-04-22 NOTE — Care Management Note (Signed)
Case Management Note  Patient Details  Name: Carol [name] MRN: [id number] Date of Birth: Jul 14, 1935  Subjective/Objective:       Adm w angina, for cabg on mon 10-31             Action/Plan: was living alone. May need snf pos dc. sw ref made   Expected Discharge Date:                  Expected Discharge Plan:  [place]  In-House Referral:  Clinical Social Work  Discharge planning Services     Post Acute Care Choice:    Choice offered to:     DME Arranged:    DME Agency:     HH Arranged:    [place] Agency:     Status of Service:     Medicare Important Message Given:    Date Medicare IM Given:    Medicare IM give by:    Date Additional Medicare IM Given:    Additional Medicare Important Message give by:     If discussed at [place] of Stay Meetings, dates discussed:    Additional Comments: cont to follow  Lacretia Leigh, RN 04/22/2015, 9:45 AM

## 2015-04-22 NOTE — Progress Notes (Addendum)
2 Days Post-Op Procedure(s) (LRB): Left Heart Cath and Coronary Angiography (N/A) Subjective:  She had some chest pain last night and said she was sitting up on the side of the bed for a couple hrs last night belching to resolve her discomfort. She has felt better today but tired. Has been up moving around her room and to the bathroom. Remains on heparin and NTG.  Objective: Vital signs in last 24 hours: Temp:  [98 F (36.7 C)-98.4 F (36.9 C)] 98.1 F (36.7 C) (10/28 0819) Pulse Rate:  [70-82] 70 (10/28 0400) Cardiac Rhythm:  [-] Heart block (10/28 0819) Resp:  [13-18] 15 (10/28 0819) BP: (123-160)/(47-65) 153/65 mmHg (10/28 0819) SpO2:  [93 %-99 %] 98 % (10/28 0819)  Hemodynamic parameters for last 24 hours:    Intake/Output from previous day: 10/27 0701 - 10/28 0700 In: 1398 [P.O.:1000; I.V.:398] Out: 2025 [Urine:2025] Intake/Output this shift: Total I/O In: 715.5 [P.O.:600; I.V.:115.5] Out: -   General appearance: alert and cooperative Heart: regular rate and rhythm, S1, S2 normal, no murmur, click, rub or gallop Lungs: clear to auscultation bilaterally  Lab Results:  Recent Labs  04/21/15 0657 04/22/15 0415  WBC 5.6 8.5  HGB 11.4* 9.3*  HCT 34.9* 28.6*  PLT 236 232   BMET:  Recent Labs  04/19/15 1735  NA 141  K 4.7  CL 106  CO2 29  GLUCOSE 81  BUN 29*  CREATININE 1.17*  CALCIUM 9.6    PT/INR:  Recent Labs  04/19/15 1735  LABPROT 13.9  INR 1.05   ABG No results found for: PHART, HCO3, TCO2, ACIDBASEDEF, O2SAT CBG (last 3)   Recent Labs  04/22/15 0820 04/22/15 1202 04/22/15 1226  GLUCAP 207* 49* 73    Assessment/Plan:  LM and severe multi-vessel coronary disease with unstable angina on heparin and NTG. Plan CABG on Monday after plavix washout.  Gaye Pollack 04/22/2015

## 2015-04-23 ENCOUNTER — Other Ambulatory Visit (HOSPITAL_COMMUNITY): Payer: Medicare Other

## 2015-04-23 LAB — CBC
HEMATOCRIT: 29.1 % — AB (ref 36.0–46.0)
Hemoglobin: 9.7 g/dL — ABNORMAL LOW (ref 12.0–15.0)
MCH: 30.4 pg (ref 26.0–34.0)
MCHC: 33.3 g/dL (ref 30.0–36.0)
MCV: 91.2 fL (ref 78.0–100.0)
PLATELETS: 212 10*3/uL (ref 150–400)
RBC: 3.19 MIL/uL — ABNORMAL LOW (ref 3.87–5.11)
RDW: 14.6 % (ref 11.5–15.5)
WBC: 7.2 10*3/uL (ref 4.0–10.5)

## 2015-04-23 LAB — URINALYSIS, ROUTINE W REFLEX MICROSCOPIC
Bilirubin Urine: NEGATIVE
GLUCOSE, UA: NEGATIVE mg/dL
HGB URINE DIPSTICK: NEGATIVE
Ketones, ur: NEGATIVE mg/dL
Nitrite: NEGATIVE
PH: 6.5 (ref 5.0–8.0)
Protein, ur: NEGATIVE mg/dL
SPECIFIC GRAVITY, URINE: 1.009 (ref 1.005–1.030)
Urobilinogen, UA: 0.2 mg/dL (ref 0.0–1.0)

## 2015-04-23 LAB — URINE MICROSCOPIC-ADD ON

## 2015-04-23 LAB — BASIC METABOLIC PANEL
Anion gap: 10 (ref 5–15)
BUN: 40 mg/dL — ABNORMAL HIGH (ref 6–20)
CALCIUM: 8.6 mg/dL — AB (ref 8.9–10.3)
CO2: 23 mmol/L (ref 22–32)
CREATININE: 1.57 mg/dL — AB (ref 0.44–1.00)
Chloride: 99 mmol/L — ABNORMAL LOW (ref 101–111)
GFR, EST AFRICAN AMERICAN: 36 mL/min — AB (ref >60)
GFR, EST NON AFRICAN AMERICAN: 31 mL/min — AB (ref >60)
GLUCOSE: 188 mg/dL — AB (ref 65–99)
Potassium: 5 mmol/L (ref 3.5–5.1)
Sodium: 132 mmol/L — ABNORMAL LOW (ref 135–145)

## 2015-04-23 LAB — GLUCOSE, CAPILLARY
GLUCOSE-CAPILLARY: 253 mg/dL — AB (ref 65–99)
GLUCOSE-CAPILLARY: 174 mg/dL — AB (ref 65–99)
GLUCOSE-CAPILLARY: 242 mg/dL — AB (ref 65–99)
Glucose-Capillary: 35 mg/dL — CL (ref 65–99)
Glucose-Capillary: 42 mg/dL — CL (ref 65–99)
Glucose-Capillary: 107 mg/dL — ABNORMAL HIGH (ref 65–99)

## 2015-04-23 LAB — SURGICAL PCR SCREEN
MRSA, PCR: NEGATIVE
Staphylococcus aureus: NEGATIVE

## 2015-04-23 LAB — HEPARIN LEVEL (UNFRACTIONATED): Heparin Unfractionated: 0.51 IU/mL (ref 0.30–0.70)

## 2015-04-23 NOTE — Clinical Social Work Placement (Signed)
   CLINICAL SOCIAL WORK PLACEMENT  NOTE  Date:  04/23/2015  Patient Details  Name: Carol [name] MRN: [id number] Date of Birth: [birth year]  Clinical Social Work is seeking post-discharge placement for this patient at the [place] level of care (*CSW will initial, date and re-position this form in  chart as items are completed):  Yes   Patient/family provided with [place] Work Department's list of facilities offering this level of care within the geographic area requested by the patient (or if unable, by the patient's family).  Yes   Patient/family informed of their freedom to choose among providers that offer the needed level of care, that participate in Medicare, Medicaid or managed care program needed by the patient, have an available bed and are willing to accept the patient.  Yes   Patient/family informed of [facility]'s ownership interest in [facility] and [facility], as well as of the fact that they are under no obligation to receive care at these facilities.  PASRR submitted to EDS on       PASRR number received on       Existing PASRR number confirmed on 04/23/15     FL2 transmitted to all facilities in geographic area requested by pt/family on       FL2 transmitted to all facilities within larger geographic area on       Patient informed that his/her managed care company has contracts with or will negotiate with certain facilities, including the following:   [facility]- Medicare)         Patient/family informed of bed offers received.  Patient chooses bed at       Physician recommends and patient chooses bed at      Patient to be transferred to   on  .  Patient to be transferred to facility by Ambulance [facility])     Patient family notified on   of transfer.  Name of family member notified:        PHYSICIAN Please prepare priority discharge summary, including medications, Please sign FL2, Please prepare prescriptions      Additional Comment:    _______________________________________________ Williemae Area, LCSW 04/23/2015, 9:10 PM

## 2015-04-23 NOTE — Progress Notes (Signed)
SUBJECTIVE:  No Chest discomfort since yesterday.  OBJECTIVE:   Vitals:   Filed Vitals:   04/23/15 0407 04/23/15 0810 04/23/15 0844 04/23/15 0845  BP: 126/54 127/59 121/81 121/82  Pulse:   57   Temp: 97.7 F (36.5 C)  97.8 F (36.6 C)   TempSrc: Oral  Oral   Resp: 20  18   Height:      Weight:      SpO2: 97%  97%    I&O's:   Intake/Output Summary (Last 24 hours) at 04/23/15 1040 Last data filed at 04/23/15 0900  Gross per 24 hour  Intake  862.5 ml  Output    800 ml  Net   62.5 ml   TELEMETRY: Reviewed telemetry pt in NSR:     PHYSICAL EXAM General: Well developed, well nourished, in no acute distress Head:   Normal cephalic and atramatic  Lungs:   Clear bilaterally to auscultation. Heart:   HRRR S1 S2  No JVD.   Abdomen: abdomen soft and non-tender Msk:  Back normal,  Normal strength and tone for age. Extremities:  No edema.  Left radial site bruised. Neuro: Alert and oriented. Psych:  Normal affect, responds appropriately Skin: No rash   LABS: Basic Metabolic Panel:  Recent Labs  04/23/15 0236  NA 132*  K 5.0  CL 99*  CO2 23  GLUCOSE 188*  BUN 40*  CREATININE 1.57*  CALCIUM 8.6*   Liver Function Tests: No results for input(s): AST, ALT, ALKPHOS, BILITOT, PROT, ALBUMIN in the last 72 hours. No results for input(s): LIPASE, AMYLASE in the last 72 hours. CBC:  Recent Labs  04/22/15 0415 04/23/15 0236  WBC 8.5 7.2  HGB 9.3* 9.7*  HCT 28.6* 29.1*  MCV 89.1 91.2  PLT 232 212   Cardiac Enzymes: No results for input(s): CKTOTAL, CKMB, CKMBINDEX, TROPONINI in the last 72 hours. BNP: Invalid input(s): POCBNP D-Dimer: No results for input(s): DDIMER in the last 72 hours. Hemoglobin A1C: No results for input(s): HGBA1C in the last 72 hours. Fasting Lipid Panel: No results for input(s): CHOL, HDL, LDLCALC, TRIG, CHOLHDL, LDLDIRECT in the last 72 hours. Thyroid Function Tests: No results for input(s): TSH, T4TOTAL, T3FREE, THYROIDAB in the  last 72 hours.  Invalid input(s): FREET3 Anemia Panel: No results for input(s): VITAMINB12, FOLATE, FERRITIN, TIBC, IRON, RETICCTPCT in the last 72 hours. Coag Panel:   Lab Results  Component Value Date   INR 1.05 04/19/2015   INR 1.02 12/20/2014   INR 1.14 02/24/2014    RADIOLOGY: X-ray Chest Pa And Lateral  04/19/2015  CLINICAL DATA:  79 year old female with chest pressure for the past 3 days. Cannot lie flat while sleeping. Shortness of breath. EXAM: CHEST  2 VIEW COMPARISON:  Chest x-ray 08/19/2014. FINDINGS: Lung volumes are normal. No consolidative airspace disease. No pleural effusions. No pneumothorax. No pulmonary nodule or mass noted. Pulmonary vasculature and the cardiomediastinal silhouette are within normal limits. Atherosclerosis in the thoracic aorta. Surgical clips project over the right upper quadrant of the abdomen, likely from prior cholecystectomy. IMPRESSION: 1.  No radiographic evidence of acute cardiopulmonary disease. 2. Atherosclerosis. Electronically Signed   By: Vinnie Langton M.D.   On: 04/19/2015 18:11   Ct Coronary Morp W/cta Cor W/score W/ca W/cm &/or Wo/cm  04/21/2015  ADDENDUM REPORT: 04/21/2015 10:03 CLINICAL DATA:  Chest pain?  LM stenosis EXAM: Cardiac CTA MEDICATIONS: IV nitro  and lopressor 10 mg TECHNIQUE: The patient was scanned on a Philips [id number] slice scanner. Gantry  rotation speed was 270 msecs. Collimation was .63mm. A 100 kV prospective scan was triggered in the descending thoracic aorta at 111 HU's with 5% padding centered around 78% of the R-R interval. Average HR during the scan was bpm. The 3D data set was interpreted on a dedicated work station using MPR, MIP and VRT modes. A total of 80cc of contrast was used. FINDINGS: Non-cardiac: See separate report from [facility]. No significant findings on limited lung and soft tissue windows. Coronary Arteries: Right dominant with no anomalies The left main coronary artery arose from the left cusp.  There was a large area of calcium at the ostium and proximal portion of the left main. The mid to distal LM had a soft plaque with high risk Feature of lucent core. The ostial LAD was also heavily calcified. The degree of stenosis in the LM appears to be in the 50-75% range and with both calcium and soft plaque with "hazy" appearance On angiography this would appear to be surgical disease Images also reviewed by Dr Meda Coffee IMPRESSION: 1) Significant mixed plaque in the LM coronary artery including soft plaque with lucent core suggesting high risk feature and correlating with "hazy" appearance on cath. Stenosis 50-75% And with morphologic features this would be considered surgical disease [place] Electronically Signed   By: Jenkins Rouge M.D.   On: 04/21/2015 10:03  04/21/2015  EXAM: OVER-READ INTERPRETATION  CT CHEST The following report is an over-read performed by radiologist Dr. Samara Snide [facility] [place] on 04/21/2015. This over-read does not include interpretation of cardiac or coronary anatomy or pathology. The coronary calcium score/coronary CTA interpretation by the cardiologist is attached. COMPARISON:  02/24/2014 and 05/09/2009 chest CT studies. FINDINGS: Visualized portions of the great vessels are normal caliber. No adenopathy in the visualized chest. Small to moderate hiatal hernia. No pneumothorax or pleural effusion in the visualized chest. There is a solitary 3 mm ground-glass pulmonary nodule in the right middle lobe (series 204/image 11), not definitely seen on prior chest CT studies. Otherwise no acute consolidative airspace disease or significant pulmonary nodules in the visualized chest. Status post cholecystectomy. Granulomatous calcifications are noted in the nonenlarged spleen. Moderate degenerative changes in the visualized thoracic spine. No suspicious focal osseous lesions. IMPRESSION: 1. Solitary 3 mm right middle lobe ground-glass pulmonary nodule. No further follow-up  is required. This recommendation follows the consensus statement: Recommendations for the Management of Subsolid Pulmonary Nodules Detected at CT: A Statement from the [place] as published in Radiology 2013; 266:304-317. 2. Small to moderate hiatal hernia. Electronically Signed: By: Ilona Sorrel M.D. On: 04/21/2015 09:16      ASSESSMENT: Kathyrn Lass:    1) CAD/prior ischemic cardiomyopathy:  Multivessel CAD. Plan CABG on Monday after Plavix washout.  Continue IV NTG at this time.   2)Chronic diastolic heart failure: appears euvolemic.  3) BP stable.  Continue current meds.  4) Renal insufficiency noted post cath.  Hold ACE-I at this time.    Jettie Booze, MD  04/23/2015  10:40 AM

## 2015-04-23 NOTE — Progress Notes (Signed)
ANTICOAGULATION CONSULT NOTE - Follow Up Consult  Pharmacy Consult for Heparin Indication: chest pain/ACS  Allergies  Allergen Reactions  . Contrast Media [Iodinated Diagnostic Agents] Other (See Comments)    unknown  . Cortizone-10 [Hydrocortisone] Other (See Comments)    unknown  . Lipitor [Atorvastatin]     Muscle aches- severe  . Zocor [Simvastatin]     Muscle aches- severe    Patient Measurements: Height: 5\' 7"  (170.2 cm) Weight: 218 lb 6.4 oz (99.066 kg) (a scale) IBW/kg (Calculated) : 61.6 Heparin Dosing Weight: 84 kg  Vital Signs: Temp: 98.1 F (36.7 C) (10/29 1211) Temp Source: Oral (10/29 1211) BP: 105/59 mmHg (10/29 1211) Pulse Rate: 60 (10/29 1211)  Labs:  Recent Labs  04/21/15 0657  04/21/15 1200 04/22/15 0415 04/23/15 0236  HGB 11.4*  --   --  9.3* 9.7*  HCT 34.9*  --   --  28.6* 29.1*  PLT 236  --   --  232 212  HEPARINUNFRC  --   < > 0.68 0.47 0.51  CREATININE  --   --   --   --  1.57*  < > = values in this interval not displayed.  Estimated Creatinine Clearance: 36.3 mL/min (by C-G formula based on Cr of 1.57).  Assessment: 79 yo woman admitted 04/19/2015 with multivessel CAD awaiting CABG on 10/31 after Plavix washout on heparin.  HL 0.51 therapeutic on 1050 units/h. CBC stable. No noted bleeding.  Goal of Therapy:  Heparin level 0.3-0.7 units/ml Monitor platelets by anticoagulation protocol: Yes   Plan:  Continue Heparin drip at 1050 units/hr. Daily heparin level and CBC. Monitor s/sx bleeding CABG 10/31    Heloise Ochoa, [place].D. PGY2 Cardiology Pharmacy Resident Pager: [phone] 04/23/2015,3:44 PM

## 2015-04-23 NOTE — Clinical Social Work Note (Addendum)
Clinical Social Work Assessment  Patient Details  Name: Carol [name] MRN: [id number] Date of Birth: 1936-01-26  Date of referral:  04/23/15               Reason for consult:  Facility Placement                Permission sought to share information with:  Family Supports, Chartered certified accountant granted to share information::  Yes, Verbal Permission Granted  Name::     [facility], [facility] SNF's, Daughters and Son   Housing/Transportation Living arrangements for the past 2 months:  [place] of Information:  Patient Patient Interpreter Needed:  None Criminal Activity/Legal Involvement Pertinent to Current Situation/Hospitalization:   None Significant Relationships:   Adult Children Lives with:  Self Do you feel safe going back to the place where you live?  Yes (After short term rehab stay) Need for family participation in patient care:  No (Coment)  Patient is alert and oriented; she is ok with her children being involved in d/c planning needs.  Care giving concerns: Patient states she lives alone and wants short term SNF at [facility] only.  Social Worker assessment / plan:  CSW met with patient this afternoon to discuss her request for short term SNF after she has heart surgery. Patient indicates that she was recently a patient X's 1 week at [facility] prior to returning home where she lives. Patient requests that CSW "secure her a bed at [facility]" so that she will have a guaranteed bed when released from the hospital. She indicates that she is to undergo heart surgery next week and then states that per MD she will be in the hospital for about 1 week- then will require short term SNF.   She adamantly refuses to be placed anywhere but [facility]. CSW discussed possible options with no success.  "I will go home if I cannot get a bed at [facility]."  She relates that she would have sufficient help at home and would  accept home health if necessary.  Fl2 initiated and placed on chart but will require updating after surgery.    Employment status:   Retired Nurse, adult PT Recommendations:  Not assessed at this time Information / Referral to community resources:   (None at this time)  Patient/Family's Response to care: Patient states that she is currently receiving appropriate care and is aware of need for heart surgery in the near future. She is focused on recovery and return home as soon as possible.  Patient/Family's Understanding of and Emotional Response to Diagnosis, Current Treatment, and Prognosis:  Patient noted to be very knowledgeable about her current medical needs, treatment plan and prognosis. She was able to discuss her future medical needs in a quite, stoic manner and is very confident in her d/c plan for short term rehab at [facility].  Patient states that she will draw deeply on her faith in God to insure that there will be a bed available for her when medically stable for SNF.  Emotional Assessment Appearance:  Appears stated age Attitude/Demeanor/Rapport:   (Cooperative, rational, calm, confident) Affect (typically observed):  Pleasant, Calm, Happy Orientation:  Oriented to Self, Oriented to Place, Oriented to  Time, Oriented to Situation Alcohol / Substance use:  Never Used Psych involvement (Current and /or in the community):  No (Comment)  Discharge Needs  Concerns to be addressed:  Care Coordination  Readmission within the last 30 days:  No Current discharge risk:  Lives alone Barriers to Discharge:  Continued Medical Work up   Kendell Bane T, LCSW 04/23/2015,5:45 PM

## 2015-04-23 NOTE — Progress Notes (Signed)
Hypoglycemic Event  CBG: 49  Treatment: 15 GM carbohydrate snack  Symptoms: Sweaty  Follow-up CBG: Time:1226 CBG Result:73  Possible Reasons for Event: Inadequate meal intake     Chauncy Mangiaracina, Meda Klinefelter

## 2015-04-23 NOTE — NC FL2 (Signed)
[place] LEVEL OF CARE SCREENING TOOL     IDENTIFICATION  Patient Name: Carol [name] Birthdate: [birth year] Sex: female Admission Date (Current Location): 04/19/2015  [facility] and [place] Number: [employer] and Address:  [facility] [address], [place], [place] [address]      Provider Number: [id number]  Attending Physician Name and Address:  Belva Crome, MD  Relative Name and Phone Number:       Current Level of Care: Hospital Recommended Level of Care: [place] Prior Approval Number:    Date Approved/Denied:   PASRR Number: [id number]  Discharge Plan: SNF    Current Diagnoses: Patient Active Problem List   Diagnosis Date Noted  . Unstable angina ([place]) 04/19/2015  . Chronic diastolic CHF (congestive heart failure) ([place]) 04/19/2015  . GERD (gastroesophageal reflux disease) 04/19/2015  . Spinal stenosis, lumbar region, with neurogenic claudication 12/22/2014  . Diabetes ([place]) 04/10/2014  . Asthma, moderate persistent 03/23/2014  . Post-infarction angina ([place]) 02/17/2014  . Abnormal nuclear stress test: Intermediate risk 02/17/2014  . Presence of drug coated stent in left circumflex coronary artery: OM2 - Xience Alpine DES 2.25 mm x 12, 2.25 mm x 8 mm overlap 02/17/2014    Class: Acute  . Ischemic cardiomyopathy - with near resolution of LVEF post MI 02/15/2014  . Chronic systolic CHF (congestive heart failure) ([place]) 02/15/2014  . CKD stage 3 due to type 2 diabetes mellitus ([place]) 02/15/2014  . Chronic systolic HF (heart failure) ([place]) 02/01/2014  . Angina at rest [facility]) 01/22/2014  . Hyperlipidemia with target LDL less than 70 01/22/2014  . History of non-ST elevation myocardial infarction (NSTEMI) 01/22/2014  . CAD S/P percutaneous coronary angioplasty 01/22/2014  . S/P PTCA (percutaneous transluminal coronary angioplasty)01/22/14 01/22/2014  . Essential hypertension, benign 12/05/2012  .  Mixed conductive and sensorineural hearing loss 12/05/2012  . Essential hypertension 12/05/2012  . Unspecified hypothyroidism 12/05/2012  . Lower back pain 12/05/2012  . Gout, unspecified     Orientation ACTIVITIES/SOCIAL BLADDER RESPIRATION    Self, Time, Situation, Place  Active Continent Normal  BEHAVIORAL SYMPTOMS/MOOD NEUROLOGICAL BOWEL NUTRITION STATUS      Continent Diet (Heart healthy)  PHYSICIAN VISITS COMMUNICATION OF NEEDS Height & Weight Skin  30 days Verbally 5\' 7"  (170.2 cm) 218 lbs. Normal          AMBULATORY STATUS RESPIRATION    Supervision limited Normal      Personal Care Assistance Level of Assistance  Dressing, Bathing Bathing Assistance: Limited assistance   Dressing Assistance: Limited assistance      Functional Limitations Info                SPECIAL CARE FACTORS FREQUENCY  PT (By licensed PT), OT (By licensed OT)     PT Frequency: 5 OT Frequency: 5           Additional Factors Info  Code Status, Allergies Code Status Info: Full Code Allergies Info: Contrast Media, Cortizone 10, Lipitor, Zocor           Current Medications (04/23/2015): Current Facility-Administered Medications  Medication Dose Route Frequency Provider Last Rate Last Dose  . 0.9 %  sodium chloride infusion  250 mL Intravenous PRN Belva Crome, MD      . acetaminophen (TYLENOL) tablet 650 mg  650 mg Oral Q4H PRN Liliane Shi, PA-C   650 mg at 04/23/15 [id number]  . allopurinol (ZYLOPRIM) tablet 300 mg  300 mg  Oral q morning - 10a Liliane Shi, PA-C   300 mg at 04/23/15 [id number]  . amLODipine (NORVASC) tablet 2.5 mg  2.5 mg Oral Daily [name], MD   2.5 mg at 04/23/15 0939  . aspirin EC tablet 81 mg  81 mg Oral Daily Liliane Shi, PA-C   81 mg at 04/23/15 [id number]  . carvedilol (COREG) tablet 6.25 mg  6.25 mg Oral BID WC [name], MD   6.25 mg at 04/23/15 1748  . colchicine tablet 0.6 mg  0.6 mg Oral Daily PRN Liliane Shi, PA-C      . famotidine  (PEPCID) tablet 20 mg  20 mg Oral QHS Belva Crome, MD   20 mg at 04/22/15 2139  . furosemide (LASIX) tablet 40 mg  40 mg Oral Daily Liliane Shi, PA-C   40 mg at 04/23/15 [id number]  . heparin ADULT infusion 100 units/mL (25000 units/250 mL)  1,050 Units/hr Intravenous Continuous Lyndee Leo, RPH 10.5 mL/hr at 04/23/15 0800 1,050 Units/hr at 04/23/15 0800  . insulin aspart (novoLOG) injection 0-20 Units  0-20 Units Subcutaneous TID WC Liliane Shi, PA-C   4 Units at 04/23/15 1251  . insulin aspart (novoLOG) injection 0-5 Units  0-5 Units Subcutaneous QHS Liliane Shi, PA-C   2 Units at 04/21/15 2205  . insulin aspart protamine- aspart (NOVOLOG MIX 70/30) injection 32 Units  32 Units Subcutaneous Q breakfast Liliane Shi, PA-C   32 Units at 04/23/15 0900  . levothyroxine (SYNTHROID, LEVOTHROID) tablet 50 mcg  50 mcg Oral QAC breakfast Belva Crome, MD   50 mcg at 04/23/15 0809  . magnesium hydroxide (MILK OF MAGNESIA) suspension 30 mL  30 mL Oral Daily PRN Belva Crome, MD   30 mL at 04/21/15 2042  . multivitamin with minerals tablet 1 tablet  1 tablet Oral Daily Belva Crome, MD   1 tablet at 04/23/15 [phone]  . nitroGLYCERIN (NITROSTAT) SL tablet 0.4 mg  0.4 mg Sublingual Q5 min PRN Liliane Shi, PA-C      . nitroGLYCERIN 50 mg in dextrose 5 % 250 mL (0.2 mg/mL) infusion  5 mcg/min Intravenous Continuous Liliane Shi, PA-C 6 mL/hr at 04/23/15 0800 20 mcg/min at 04/23/15 0800  . ondansetron (ZOFRAN) injection 4 mg  4 mg Intravenous Q6H PRN Belva Crome, MD   4 mg at 04/23/15 1251  . pantoprazole (PROTONIX) EC tablet 40 mg  40 mg Oral BID Belva Crome, MD   40 mg at 04/23/15 0939  . sodium chloride 0.9 % injection 3 mL  3 mL Intravenous Q12H Belva Crome, MD   3 mL at 04/23/15 1000  . sodium chloride 0.9 % injection 3 mL  3 mL Intravenous PRN Belva Crome, MD      . traMADol Veatrice Bourbon) tablet 50 mg  50 mg Oral Q12H [name], MD   50 mg at 04/23/15 [id number]   Do not use this list as  official medication orders. Please verify with discharge summary.  Discharge Medications:   Medication List    ASK your doctor about these medications        allopurinol 300 MG tablet  Commonly known as:  ZYLOPRIM  Take 300 mg by mouth every morning.     aspirin 81 MG EC tablet  Take 1 tablet (81 mg total) by mouth daily.     Biotin 5000 MCG Caps  Take 1 capsule by  mouth every morning.     carvedilol 3.125 MG tablet  Commonly known as:  COREG  Take 1 tablet (3.125 mg total) by mouth 2 (two) times daily with a meal.     clopidogrel 75 MG tablet  Commonly known as:  PLAVIX  TAKE 1 TABLET BY MOUTH ONCE DAILY.     colchicine 0.6 MG tablet  Take 0.6 mg by mouth daily as needed (gout flare up.).     famotidine 10 MG tablet  Commonly known as:  PEPCID AC  Take 1 tablet (10 mg total) by mouth 2 (two) times daily.     furosemide 40 MG tablet  Commonly known as:  LASIX  Take 40 mg by mouth daily.     insulin NPH-regular Human (70-30) 100 UNIT/ML injection  Commonly known as:  HUMULIN 70/30  Inject 32 Units into the skin daily with breakfast. And syringes 1/day     isosorbide mononitrate 30 MG 24 hr tablet  Commonly known as:  IMDUR  Take 1 tablet (30 mg total) by mouth 2 (two) times daily.     levothyroxine 50 MCG tablet  Commonly known as:  SYNTHROID, LEVOTHROID  Take 50 mcg by mouth every morning.     lisinopril 2.5 MG tablet  Commonly known as:  PRINIVIL,ZESTRIL  Take 1 tablet (2.5 mg total) by mouth daily.     nitroGLYCERIN 0.4 MG SL tablet  Commonly known as:  NITROSTAT  Place 0.4 mg under the tongue every 5 (five) minutes as needed for chest pain.     oxyCODONE-acetaminophen 5-325 MG tablet  Commonly known as:  PERCOCET/ROXICET  Take 1-2 tablets by mouth every 4 (four) hours as needed for moderate pain.     pantoprazole 40 MG tablet  Commonly known as:  PROTONIX  TAKE 1 TABLET BY MOUTH ONCE DAILY.     PROAIR HFA 108 (90 BASE) MCG/ACT inhaler  Generic  drug:  albuterol  Inhale 1-2 puffs into the lungs every 4 (four) hours as needed for wheezing or shortness of breath. For breathing     traMADol 50 MG tablet  Commonly known as:  ULTRAM  Take 50 mg by mouth daily as needed for moderate pain.        Relevant Imaging Results:  Relevant Lab Results:  Recent Labs    Additional Information Lives alone- needs short term SNF for recovery after heart surgery  Kelee Cunningham, Lorie [place], LCSW

## 2015-04-24 ENCOUNTER — Inpatient Hospital Stay (HOSPITAL_COMMUNITY): Payer: Medicare Other

## 2015-04-24 DIAGNOSIS — I25119 Atherosclerotic heart disease of native coronary artery with unspecified angina pectoris: Secondary | ICD-10-CM

## 2015-04-24 LAB — CBC
HCT: 29.8 % — ABNORMAL LOW (ref 36.0–46.0)
HEMOGLOBIN: 9.4 g/dL — AB (ref 12.0–15.0)
MCH: 28.8 pg (ref 26.0–34.0)
MCHC: 31.5 g/dL (ref 30.0–36.0)
MCV: 91.4 fL (ref 78.0–100.0)
PLATELETS: 233 10*3/uL (ref 150–400)
RBC: 3.26 MIL/uL — AB (ref 3.87–5.11)
RDW: 14.5 % (ref 11.5–15.5)
WBC: 6.3 10*3/uL (ref 4.0–10.5)

## 2015-04-24 LAB — BLOOD GAS, ARTERIAL
Acid-Base Excess: 0.7 mmol/L (ref 0.0–2.0)
BICARBONATE: 24.7 meq/L — AB (ref 20.0–24.0)
Drawn by: 236041
FIO2: 0.21
O2 SAT: 94.2 %
PH ART: 7.413 (ref 7.350–7.450)
PO2 ART: 73 mmHg — AB (ref 80.0–100.0)
Patient temperature: 98.6
TCO2: 25.9 mmol/L (ref 0–100)
pCO2 arterial: 39.5 mmHg (ref 35.0–45.0)

## 2015-04-24 LAB — BASIC METABOLIC PANEL
Anion gap: 8 (ref 5–15)
BUN: 38 mg/dL — AB (ref 6–20)
CHLORIDE: 99 mmol/L — AB (ref 101–111)
CO2: 28 mmol/L (ref 22–32)
CREATININE: 1.45 mg/dL — AB (ref 0.44–1.00)
Calcium: 9 mg/dL (ref 8.9–10.3)
GFR, EST AFRICAN AMERICAN: 39 mL/min — AB (ref >60)
GFR, EST NON AFRICAN AMERICAN: 34 mL/min — AB (ref >60)
Glucose, Bld: 143 mg/dL — ABNORMAL HIGH (ref 65–99)
Potassium: 4.8 mmol/L (ref 3.5–5.1)
SODIUM: 135 mmol/L (ref 135–145)

## 2015-04-24 LAB — GLUCOSE, CAPILLARY
GLUCOSE-CAPILLARY: 233 mg/dL — AB (ref 65–99)
GLUCOSE-CAPILLARY: 165 mg/dL — AB (ref 65–99)
GLUCOSE-CAPILLARY: 105 mg/dL — AB (ref 65–99)

## 2015-04-24 LAB — HEPARIN LEVEL (UNFRACTIONATED): HEPARIN UNFRACTIONATED: 0.48 [IU]/mL (ref 0.30–0.70)

## 2015-04-24 MED ORDER — NITROGLYCERIN IN D5W 200-5 MCG/ML-% IV SOLN: 2.0000 ug/min | INTRAVENOUS | Status: DC

## 2015-04-24 MED ORDER — TEMAZEPAM 15 MG PO CAPS: 15.0000 mg | ORAL_CAPSULE | Freq: Once | ORAL | Status: DC | PRN

## 2015-04-24 MED ORDER — MAGNESIUM SULFATE 50 % IJ SOLN
40.0000 meq | INTRAMUSCULAR | Status: DC
  Filled 2015-04-24: qty 10

## 2015-04-24 MED ORDER — DEXMEDETOMIDINE HCL IN NACL 400 MCG/100ML IV SOLN
0.1000 ug/kg/h | INTRAVENOUS | Status: AC
  Administered 2015-04-25: .3 ug/kg/h via INTRAVENOUS
  Filled 2015-04-24: qty 100

## 2015-04-24 MED ORDER — CHLORHEXIDINE GLUCONATE CLOTH 2 % EX PADS
6.0000 | MEDICATED_PAD | Freq: Once | CUTANEOUS | Status: AC
  Administered 2015-04-24: 6 via TOPICAL

## 2015-04-24 MED ORDER — CHLORHEXIDINE GLUCONATE CLOTH 2 % EX PADS
6.0000 | MEDICATED_PAD | Freq: Once | CUTANEOUS | Status: AC
  Administered 2015-04-25: 6 via TOPICAL

## 2015-04-24 MED ORDER — VANCOMYCIN HCL 10 G IV SOLR
1500.0000 mg | INTRAVENOUS | Status: AC
  Administered 2015-04-25: 1500 mg via INTRAVENOUS
  Filled 2015-04-24: qty 1500

## 2015-04-24 MED ORDER — ALPRAZOLAM 0.25 MG PO TABS: 0.2500 mg | ORAL_TABLET | ORAL | Status: DC | PRN

## 2015-04-24 MED ORDER — DEXTROSE 5 % IV SOLN
1.5000 g | INTRAVENOUS | Status: AC
  Administered 2015-04-25: 1.5 g via INTRAVENOUS
  Administered 2015-04-25: .75 g via INTRAVENOUS
  Filled 2015-04-24: qty 1.5

## 2015-04-24 MED ORDER — CHLORHEXIDINE GLUCONATE 0.12 % MT SOLN
15.0000 mL | Freq: Once | OROMUCOSAL | Status: AC
  Administered 2015-04-25: 15 mL via OROMUCOSAL
  Filled 2015-04-24: qty 15

## 2015-04-24 MED ORDER — SODIUM CHLORIDE 0.9 % IV SOLN
INTRAVENOUS | Status: AC
  Administered 2015-04-25: 2.1 [IU]/h via INTRAVENOUS
  Filled 2015-04-24: qty 2.5

## 2015-04-24 MED ORDER — PLASMA-LYTE 148 IV SOLN
INTRAVENOUS | Status: AC
  Administered 2015-04-25: 500 mL
  Filled 2015-04-24: qty 2.5

## 2015-04-24 MED ORDER — BISACODYL 5 MG PO TBEC
5.0000 mg | DELAYED_RELEASE_TABLET | Freq: Once | ORAL | Status: AC
  Administered 2015-04-24: 5 mg via ORAL
  Filled 2015-04-24: qty 1

## 2015-04-24 MED ORDER — DIAZEPAM 5 MG PO TABS
5.0000 mg | ORAL_TABLET | Freq: Once | ORAL | Status: AC
  Administered 2015-04-25: 5 mg via ORAL
  Filled 2015-04-24: qty 1

## 2015-04-24 MED ORDER — DOPAMINE-DEXTROSE 3.2-5 MG/ML-% IV SOLN
0.0000 ug/kg/min | INTRAVENOUS | Status: DC
  Filled 2015-04-24: qty 250

## 2015-04-24 MED ORDER — PHENYLEPHRINE HCL 10 MG/ML IJ SOLN
30.0000 ug/min | INTRAVENOUS | Status: AC
  Administered 2015-04-25: 10 ug/min via INTRAVENOUS
  Filled 2015-04-24: qty 2

## 2015-04-24 MED ORDER — SODIUM CHLORIDE 0.9 % IV SOLN
INTRAVENOUS | Status: DC
  Filled 2015-04-24: qty 30

## 2015-04-24 MED ORDER — EPINEPHRINE HCL 1 MG/ML IJ SOLN
0.0000 ug/min | INTRAVENOUS | Status: DC
  Filled 2015-04-24: qty 4

## 2015-04-24 MED ORDER — POTASSIUM CHLORIDE 2 MEQ/ML IV SOLN
80.0000 meq | INTRAVENOUS | Status: DC
  Filled 2015-04-24: qty 40

## 2015-04-24 MED ORDER — SODIUM CHLORIDE 0.9 % IV SOLN
INTRAVENOUS | Status: AC
  Administered 2015-04-25: 70 mL/h via INTRAVENOUS
  Filled 2015-04-24: qty 40

## 2015-04-24 MED ORDER — DEXTROSE 5 % IV SOLN
750.0000 mg | INTRAVENOUS | Status: DC
  Filled 2015-04-24: qty 750

## 2015-04-24 NOTE — Progress Notes (Signed)
ANTICOAGULATION CONSULT NOTE - Follow Up Consult  Pharmacy Consult for Heparin Indication: chest pain/ACS  Allergies  Allergen Reactions  . Contrast Media [Iodinated Diagnostic Agents] Other (See Comments)    unknown  . Cortizone-10 [Hydrocortisone] Other (See Comments)    unknown  . Lipitor [Atorvastatin]     Muscle aches- severe  . Zocor [Simvastatin]     Muscle aches- severe    Patient Measurements: Height: 5\' 7"  (170.2 cm) Weight: 218 lb 6.4 oz (99.066 kg) (a scale) IBW/kg (Calculated) : 61.6 Heparin Dosing Weight: 84 kg  Vital Signs: Temp: 98.1 F (36.7 C) (10/30 1132) Temp Source: Oral (10/30 1132) BP: 116/57 mmHg (10/30 1132) Pulse Rate: 62 (10/30 1132)  Labs:  Recent Labs  04/22/15 0415 04/23/15 0236 04/24/15 0325  HGB 9.3* 9.7* 9.4*  HCT 28.6* 29.1* 29.8*  PLT 232 212 233  HEPARINUNFRC 0.47 0.51 0.48  CREATININE  --  1.57* 1.45*    Estimated Creatinine Clearance: 39.3 mL/min (by C-G formula based on Cr of 1.45).  Assessment: 79 yo woman admitted 04/19/2015 with multivessel CAD awaiting CABG on 10/31 after Plavix washout on heparin.  HL 0.49 therapeutic on 1050 units/h. CBC stable. No noted bleeding.  Goal of Therapy:  Heparin level 0.3-0.7 units/ml Monitor platelets by anticoagulation protocol: Yes   Plan:  Continue Heparin drip at 1050 units/hr Daily heparin level and CBC Monitor s/sx bleeding CABG 10/31    Heloise Ochoa, [place].D. PGY2 Cardiology Pharmacy Resident Pager: [phone] 04/24/2015,1:59 PM

## 2015-04-24 NOTE — Progress Notes (Signed)
TCTS BRIEF PROGRESS NOTE   Clinically stable No chest pain or SOB For OR tomorrow by Dr. Concha Norway, MD 04/24/2015 3:25 PM

## 2015-04-24 NOTE — Progress Notes (Signed)
Patient ID: Carol [name], female   DOB: Nov 14, 1935, 79 y.o.   MRN: [id number]     Subjective:    No complaints  Objective:   Temp:  [97.2 F (36.2 C)-98.6 F (37 C)] 98.6 F (37 C) (10/30 0800) Pulse Rate:  [56-77] 70 (10/30 0813) Resp:  [16-18] 16 (10/30 0800) BP: (105-137)/(43-80) 110/59 mmHg (10/30 0813) SpO2:  [96 %-98 %] 97 % (10/30 0800) Last BM Date: 04/22/15  Filed Weights   04/19/15 1624 04/20/15 0451  Weight: 217 lb 4.8 oz (98.567 kg) 218 lb 6.4 oz (99.066 kg)    Intake/Output Summary (Last 24 hours) at 04/24/15 1000 Last data filed at 04/24/15 0800  Gross per 24 hour  Intake    363 ml  Output      0 ml  Net    363 ml    Telemetry: NSR  Exam:  General: NAD  Resp: CTAB  Cardiac: RRR, no m/r/g, no jvd  GI: abdomen soft, NT, ND  MSK: no LE edema  Neuro: no focal deficits  Psych: appropriate affect  Lab Results:  Basic Metabolic Panel:  Recent Labs Lab 04/19/15 1735 04/23/15 0236 04/24/15 0325  NA 141 132* 135  K 4.7 5.0 4.8  CL 106 99* 99*  CO2 29 23 28   GLUCOSE 81 188* 143*  BUN 29* 40* 38*  CREATININE 1.17* 1.57* 1.45*  CALCIUM 9.6 8.6* 9.0    Liver Function Tests:  Recent Labs Lab 04/19/15 1735  AST 19  ALT 15  ALKPHOS 114  BILITOT 0.4  PROT 7.0  ALBUMIN 3.9    CBC:  Recent Labs Lab 04/22/15 0415 04/23/15 0236 04/24/15 0325  WBC 8.5 7.2 6.3  HGB 9.3* 9.7* 9.4*  HCT 28.6* 29.1* 29.8*  MCV 89.1 91.2 91.4  PLT 232 212 233    Cardiac Enzymes:  Recent Labs Lab 04/19/15 1735 04/19/15 2204 04/20/15 0432  TROPONINI <0.03 <0.03 <0.03    BNP: No results for input(s): PROBNP in the last 8760 hours.  Coagulation:  Recent Labs Lab 04/19/15 1735  INR 1.05    ECG:   Medications:   Scheduled Medications: . allopurinol  300 mg Oral q morning - 10a  . amLODipine  2.5 mg Oral Daily  . aspirin EC  81 mg Oral Daily  . carvedilol  6.25 mg Oral BID WC  . famotidine  20 mg Oral QHS  . furosemide  40 mg  Oral Daily  . insulin aspart  0-20 Units Subcutaneous TID WC  . insulin aspart  0-5 Units Subcutaneous QHS  . insulin aspart protamine- aspart  32 Units Subcutaneous Q breakfast  . levothyroxine  50 mcg Oral QAC breakfast  . multivitamin with minerals  1 tablet Oral Daily  . pantoprazole  40 mg Oral BID  . sodium chloride  3 mL Intravenous Q12H  . traMADol  50 mg Oral Q12H     Infusions: . heparin 1,050 Units/hr (04/24/15 0800)  . nitroGLYCERIN 20 mcg/min (04/24/15 0800)     PRN Medications:  sodium chloride, acetaminophen, colchicine, magnesium hydroxide, nitroGLYCERIN, ondansetron (ZOFRAN) IV, sodium chloride     Assessment/Plan   1. CAD - cath 04/20/15 with LM 50-70%, RCA 70%, 1st RPLB 70%. - 02/2015 echo LVEF 60-65% - awaiting CABG Monday  - medical therapy with ASA, coreg, hep gtt, nitro gtt. Intolerant to statins. No ACE due to increase in Cr.   Plan for CABG tomorrow. NPO at midnight.       Carlyle Dolly, M.D.

## 2015-04-24 NOTE — Progress Notes (Signed)
VASCULAR LAB PRELIMINARY  PRELIMINARY  PRELIMINARY  PRELIMINARY  Pre-op Cardiac Surgery  Carotid Findings:  Carotid completed 04/21/2015 by Landry Mellow, RVT. Bilateral:  1-39% ICA stenosis.  Vertebral artery flow is antegrade.     Upper Extremity Right Left  Brachial Pressures 134 Triphasic 124 Triphasic  Radial Waveforms Triphasic Triphasic  Ulnar Waveforms Triphasic Triphasic  Palmar Arch (Allen's Test) Normal Normal   Findings:  Doppler waveforms remained normal bilaterally with both radial and ulnar compressions.    Lower  Extremity Right Left  Dorsalis Pedis 140 Triphasic 153 triphasic  Posterior Tibial 140 Biphasic 146 Biphasic  Ankle/Brachial Indices 1.04 1.14    Findings: ABIs and Doppler waveforms are within normal limits bilaterally at rest.    Tobin Cadiente, RVS 04/24/2015, 1:02 PM

## 2015-04-25 ENCOUNTER — Inpatient Hospital Stay (HOSPITAL_COMMUNITY): Payer: Medicare Other

## 2015-04-25 ENCOUNTER — Inpatient Hospital Stay (HOSPITAL_COMMUNITY): Payer: Medicare Other | Admitting: Anesthesiology

## 2015-04-25 ENCOUNTER — Encounter (HOSPITAL_COMMUNITY)
Admission: AD | Disposition: A | Discharge status: Skilled Nursing Facility | Payer: Medicare Other | Source: Ambulatory Visit | Attending: Interventional Cardiology

## 2015-04-25 DIAGNOSIS — Z951 Presence of aortocoronary bypass graft: Secondary | ICD-10-CM

## 2015-04-25 DIAGNOSIS — I25110 Atherosclerotic heart disease of native coronary artery with unstable angina pectoris: Secondary | ICD-10-CM

## 2015-04-25 HISTORY — PX: CORONARY ARTERY BYPASS GRAFT: SHX141

## 2015-04-25 HISTORY — PX: TEE WITHOUT CARDIOVERSION: SHX5443

## 2015-04-25 LAB — PLATELET COUNT: Platelets: 122 10*3/uL — ABNORMAL LOW (ref 150–400)

## 2015-04-25 LAB — POCT I-STAT, CHEM 8
BUN: 29 mg/dL — ABNORMAL HIGH (ref 6–20)
BUN: 26 mg/dL — ABNORMAL HIGH (ref 6–20)
BUN: 24 mg/dL — AB (ref 6–20)
BUN: 25 mg/dL — AB (ref 6–20)
BUN: 27 mg/dL — AB (ref 6–20)
BUN: 23 mg/dL — ABNORMAL HIGH (ref 6–20)
CALCIUM ION: 1.25 mmol/L (ref 1.13–1.30)
CALCIUM ION: 0.98 mmol/L — AB (ref 1.13–1.30)
CHLORIDE: 102 mmol/L (ref 101–111)
CHLORIDE: 97 mmol/L — AB (ref 101–111)
CHLORIDE: 105 mmol/L (ref 101–111)
CHLORIDE: 98 mmol/L — AB (ref 101–111)
CREATININE: 1 mg/dL (ref 0.44–1.00)
CREATININE: 1.1 mg/dL — AB (ref 0.44–1.00)
Calcium, Ion: 1.18 mmol/L (ref 1.13–1.30)
Calcium, Ion: 0.96 mmol/L — ABNORMAL LOW (ref 1.13–1.30)
Calcium, Ion: 1.06 mmol/L — ABNORMAL LOW (ref 1.13–1.30)
Calcium, Ion: 1.14 mmol/L (ref 1.13–1.30)
Chloride: 100 mmol/L — ABNORMAL LOW (ref 101–111)
Chloride: 105 mmol/L (ref 101–111)
Creatinine, Ser: 1.1 mg/dL — ABNORMAL HIGH (ref 0.44–1.00)
Creatinine, Ser: 1.1 mg/dL — ABNORMAL HIGH (ref 0.44–1.00)
Creatinine, Ser: 0.9 mg/dL (ref 0.44–1.00)
Creatinine, Ser: 1 mg/dL (ref 0.44–1.00)
GLUCOSE: 105 mg/dL — AB (ref 65–99)
Glucose, Bld: 167 mg/dL — ABNORMAL HIGH (ref 65–99)
Glucose, Bld: 126 mg/dL — ABNORMAL HIGH (ref 65–99)
Glucose, Bld: 99 mg/dL (ref 65–99)
Glucose, Bld: 118 mg/dL — ABNORMAL HIGH (ref 65–99)
Glucose, Bld: 120 mg/dL — ABNORMAL HIGH (ref 65–99)
HCT: 30 % — ABNORMAL LOW (ref 36.0–46.0)
HCT: 25 % — ABNORMAL LOW (ref 36.0–46.0)
HCT: 24 % — ABNORMAL LOW (ref 36.0–46.0)
HCT: 35 % — ABNORMAL LOW (ref 36.0–46.0)
HEMATOCRIT: 20 % — AB (ref 36.0–46.0)
HEMATOCRIT: 24 % — AB (ref 36.0–46.0)
HEMOGLOBIN: 11.9 g/dL — AB (ref 12.0–15.0)
Hemoglobin: 10.2 g/dL — ABNORMAL LOW (ref 12.0–15.0)
Hemoglobin: 8.5 g/dL — ABNORMAL LOW (ref 12.0–15.0)
Hemoglobin: 6.8 g/dL — CL (ref 12.0–15.0)
Hemoglobin: 8.2 g/dL — ABNORMAL LOW (ref 12.0–15.0)
Hemoglobin: 8.2 g/dL — ABNORMAL LOW (ref 12.0–15.0)
POTASSIUM: 3.8 mmol/L (ref 3.5–5.1)
POTASSIUM: 4.7 mmol/L (ref 3.5–5.1)
POTASSIUM: 5.6 mmol/L — AB (ref 3.5–5.1)
POTASSIUM: 4.5 mmol/L (ref 3.5–5.1)
Potassium: 4.1 mmol/L (ref 3.5–5.1)
Potassium: 3.9 mmol/L (ref 3.5–5.1)
SODIUM: 137 mmol/L (ref 135–145)
SODIUM: 137 mmol/L (ref 135–145)
SODIUM: 137 mmol/L (ref 135–145)
SODIUM: 131 mmol/L — AB (ref 135–145)
Sodium: 134 mmol/L — ABNORMAL LOW (ref 135–145)
Sodium: 138 mmol/L (ref 135–145)
TCO2: 28 mmol/L (ref 0–100)
TCO2: 28 mmol/L (ref 0–100)
TCO2: 29 mmol/L (ref 0–100)
TCO2: 27 mmol/L (ref 0–100)
TCO2: 29 mmol/L (ref 0–100)
TCO2: 22 mmol/L (ref 0–100)

## 2015-04-25 LAB — CBC
HCT: 32.9 % — ABNORMAL LOW (ref 36.0–46.0)
HEMATOCRIT: 30.4 % — AB (ref 36.0–46.0)
HEMATOCRIT: 34 % — AB (ref 36.0–46.0)
HEMOGLOBIN: 9.6 g/dL — AB (ref 12.0–15.0)
HEMOGLOBIN: 10.9 g/dL — AB (ref 12.0–15.0)
Hemoglobin: 11.5 g/dL — ABNORMAL LOW (ref 12.0–15.0)
MCH: 28.9 pg (ref 26.0–34.0)
MCH: 29.4 pg (ref 26.0–34.0)
MCH: 29.7 pg (ref 26.0–34.0)
MCHC: 31.6 g/dL (ref 30.0–36.0)
MCHC: 33.1 g/dL (ref 30.0–36.0)
MCHC: 33.8 g/dL (ref 30.0–36.0)
MCV: 91.6 fL (ref 78.0–100.0)
MCV: 88.7 fL (ref 78.0–100.0)
MCV: 87.9 fL (ref 78.0–100.0)
Platelets: 223 10*3/uL (ref 150–400)
Platelets: 128 10*3/uL — ABNORMAL LOW (ref 150–400)
Platelets: 126 10*3/uL — ABNORMAL LOW (ref 150–400)
RBC: 3.32 MIL/uL — ABNORMAL LOW (ref 3.87–5.11)
RBC: 3.71 MIL/uL — AB (ref 3.87–5.11)
RBC: 3.87 MIL/uL (ref 3.87–5.11)
RDW: 14.6 % (ref 11.5–15.5)
RDW: 14.8 % (ref 11.5–15.5)
RDW: 15.3 % (ref 11.5–15.5)
WBC: 7 10*3/uL (ref 4.0–10.5)
WBC: 7.8 10*3/uL (ref 4.0–10.5)
WBC: 9.3 10*3/uL (ref 4.0–10.5)

## 2015-04-25 LAB — POCT I-STAT 3, ART BLOOD GAS (G3+)
ACID-BASE EXCESS: 5 mmol/L — AB (ref 0.0–2.0)
Acid-Base Excess: 3 mmol/L — ABNORMAL HIGH (ref 0.0–2.0)
Acid-Base Excess: 6 mmol/L — ABNORMAL HIGH (ref 0.0–2.0)
BICARBONATE: 27.8 meq/L — AB (ref 20.0–24.0)
BICARBONATE: 23.6 meq/L (ref 20.0–24.0)
Bicarbonate: 28.9 mEq/L — ABNORMAL HIGH (ref 20.0–24.0)
Bicarbonate: 30.3 mEq/L — ABNORMAL HIGH (ref 20.0–24.0)
O2 SAT: 100 %
O2 SAT: 100 %
O2 Saturation: 100 %
O2 Saturation: 97 %
PCO2 ART: 48.3 mmHg — AB (ref 35.0–45.0)
PCO2 ART: 41.5 mmHg (ref 35.0–45.0)
PH ART: 7.385 (ref 7.350–7.450)
PH ART: 7.47 — AB (ref 7.350–7.450)
PO2 ART: 528 mmHg — AB (ref 80.0–100.0)
PO2 ART: 274 mmHg — AB (ref 80.0–100.0)
PO2 ART: 84 mmHg (ref 80.0–100.0)
Patient temperature: 36.2
TCO2: 30 mmol/L (ref 0–100)
TCO2: 32 mmol/L (ref 0–100)
TCO2: 29 mmol/L (ref 0–100)
TCO2: 25 mmol/L (ref 0–100)
pCO2 arterial: 33.2 mmHg — ABNORMAL LOW (ref 35.0–45.0)
pCO2 arterial: 32.2 mmHg — ABNORMAL LOW (ref 35.0–45.0)
pH, Arterial: 7.472 — ABNORMAL HIGH (ref 7.350–7.450)
pH, Arterial: 7.53 — ABNORMAL HIGH (ref 7.350–7.450)
pO2, Arterial: 457 mmHg — ABNORMAL HIGH (ref 80.0–100.0)

## 2015-04-25 LAB — GLUCOSE, CAPILLARY
GLUCOSE-CAPILLARY: 125 mg/dL — AB (ref 65–99)
GLUCOSE-CAPILLARY: 92 mg/dL (ref 65–99)
Glucose-Capillary: 101 mg/dL — ABNORMAL HIGH (ref 65–99)
Glucose-Capillary: 105 mg/dL — ABNORMAL HIGH (ref 65–99)
Glucose-Capillary: 129 mg/dL — ABNORMAL HIGH (ref 65–99)
Glucose-Capillary: 151 mg/dL — ABNORMAL HIGH (ref 65–99)
Glucose-Capillary: 151 mg/dL — ABNORMAL HIGH (ref 65–99)
Glucose-Capillary: 157 mg/dL — ABNORMAL HIGH (ref 65–99)
Glucose-Capillary: 98 mg/dL (ref 65–99)

## 2015-04-25 LAB — BASIC METABOLIC PANEL
Anion gap: 8 (ref 5–15)
BUN: 34 mg/dL — AB (ref 6–20)
CHLORIDE: 102 mmol/L (ref 101–111)
CO2: 27 mmol/L (ref 22–32)
Calcium: 9.4 mg/dL (ref 8.9–10.3)
Creatinine, Ser: 1.47 mg/dL — ABNORMAL HIGH (ref 0.44–1.00)
GFR calc Af Amer: 39 mL/min — ABNORMAL LOW (ref >60)
GFR calc non Af Amer: 33 mL/min — ABNORMAL LOW (ref >60)
GLUCOSE: 217 mg/dL — AB (ref 65–99)
Potassium: 5.2 mmol/L — ABNORMAL HIGH (ref 3.5–5.1)
SODIUM: 137 mmol/L (ref 135–145)

## 2015-04-25 LAB — HEMOGLOBIN AND HEMATOCRIT, BLOOD
HEMATOCRIT: 23.9 % — AB (ref 36.0–46.0)
HEMOGLOBIN: 8.1 g/dL — AB (ref 12.0–15.0)

## 2015-04-25 LAB — POCT I-STAT 4, (NA,K, GLUC, HGB,HCT)
GLUCOSE: 145 mg/dL — AB (ref 65–99)
HCT: 33 % — ABNORMAL LOW (ref 36.0–46.0)
HEMOGLOBIN: 11.2 g/dL — AB (ref 12.0–15.0)
POTASSIUM: 4.2 mmol/L (ref 3.5–5.1)
SODIUM: 135 mmol/L (ref 135–145)

## 2015-04-25 LAB — PROTIME-INR
INR: 1.28 (ref 0.00–1.49)
Prothrombin Time: 16.2 seconds — ABNORMAL HIGH (ref 11.6–15.2)

## 2015-04-25 LAB — PREPARE RBC (CROSSMATCH)

## 2015-04-25 LAB — CREATININE, SERUM
Creatinine, Ser: 1.11 mg/dL — ABNORMAL HIGH (ref 0.44–1.00)
GFR calc Af Amer: 54 mL/min — ABNORMAL LOW (ref >60)
GFR calc non Af Amer: 47 mL/min — ABNORMAL LOW (ref >60)

## 2015-04-25 LAB — APTT: APTT: 32 s (ref 24–37)

## 2015-04-25 LAB — MAGNESIUM: Magnesium: 3.3 mg/dL — ABNORMAL HIGH (ref 1.7–2.4)

## 2015-04-25 LAB — HEPARIN LEVEL (UNFRACTIONATED): HEPARIN UNFRACTIONATED: 0.4 [IU]/mL (ref 0.30–0.70)

## 2015-04-25 SURGERY — CORONARY ARTERY BYPASS GRAFTING (CABG)
Anesthesia: General | Site: Chest

## 2015-04-25 MED ORDER — MORPHINE SULFATE (PF) 2 MG/ML IV SOLN
2.0000 mg | INTRAVENOUS | Status: DC | PRN
  Administered 2015-04-26 (×3): 2 mg via INTRAVENOUS
  Filled 2015-04-25: qty 2
  Filled 2015-04-25 (×2): qty 1

## 2015-04-25 MED ORDER — CEFUROXIME SODIUM 1.5 G IJ SOLR
1.5000 g | Freq: Two times a day (BID) | INTRAMUSCULAR | Status: AC
  Administered 2015-04-25 – 2015-04-27 (×4): 1.5 g via INTRAVENOUS
  Filled 2015-04-25 (×4): qty 1.5

## 2015-04-25 MED ORDER — ARTIFICIAL TEARS OP OINT
TOPICAL_OINTMENT | OPHTHALMIC | Status: DC | PRN
  Administered 2015-04-25: 1 via OPHTHALMIC

## 2015-04-25 MED ORDER — MIDAZOLAM HCL 10 MG/2ML IJ SOLN
INTRAMUSCULAR | Status: AC
  Filled 2015-04-25: qty 4

## 2015-04-25 MED ORDER — METOPROLOL TARTRATE 12.5 MG HALF TABLET
12.5000 mg | ORAL_TABLET | Freq: Two times a day (BID) | ORAL | Status: DC
  Filled 2015-04-25 (×2): qty 1

## 2015-04-25 MED ORDER — THROMBIN 20000 UNITS EX SOLR
CUTANEOUS | Status: DC | PRN
  Administered 2015-04-25: 20000 [IU] via TOPICAL

## 2015-04-25 MED ORDER — FENTANYL CITRATE (PF) 250 MCG/5ML IJ SOLN
INTRAMUSCULAR | Status: AC
  Filled 2015-04-25: qty 5

## 2015-04-25 MED ORDER — ARTIFICIAL TEARS OP OINT
TOPICAL_OINTMENT | OPHTHALMIC | Status: AC
  Filled 2015-04-25: qty 3.5

## 2015-04-25 MED ORDER — MIDAZOLAM HCL 5 MG/5ML IJ SOLN
INTRAMUSCULAR | Status: DC | PRN
Start: 2015-04-25 — End: 2015-04-25
  Administered 2015-04-25: 4 mg via INTRAVENOUS
  Administered 2015-04-25 (×3): 3 mg via INTRAVENOUS
  Administered 2015-04-25: 2 mg via INTRAVENOUS
  Administered 2015-04-25: 3 mg via INTRAVENOUS
  Administered 2015-04-25 (×2): 1 mg via INTRAVENOUS

## 2015-04-25 MED ORDER — ALBUMIN HUMAN 5 % IV SOLN
250.0000 mL | INTRAVENOUS | Status: AC | PRN
  Administered 2015-04-25 (×3): 250 mL via INTRAVENOUS
  Filled 2015-04-25: qty 250

## 2015-04-25 MED ORDER — ETOMIDATE 2 MG/ML IV SOLN
INTRAVENOUS | Status: AC
  Filled 2015-04-25: qty 10

## 2015-04-25 MED ORDER — NITROGLYCERIN IN D5W 200-5 MCG/ML-% IV SOLN
0.0000 ug/min | INTRAVENOUS | Status: DC
  Administered 2015-04-25: 50 ug/min via INTRAVENOUS

## 2015-04-25 MED ORDER — SODIUM CHLORIDE 0.9 % IJ SOLN: 3.0000 mL | Freq: Two times a day (BID) | INTRAMUSCULAR | Status: DC

## 2015-04-25 MED ORDER — FAMOTIDINE IN NACL 20-0.9 MG/50ML-% IV SOLN
20.0000 mg | Freq: Two times a day (BID) | INTRAVENOUS | Status: AC
  Administered 2015-04-25 (×2): 20 mg via INTRAVENOUS
  Filled 2015-04-25: qty 50

## 2015-04-25 MED ORDER — HEPARIN SODIUM (PORCINE) 1000 UNIT/ML IJ SOLN
INTRAMUSCULAR | Status: DC | PRN
  Administered 2015-04-25: 30000 [IU] via INTRAVENOUS

## 2015-04-25 MED ORDER — SUCCINYLCHOLINE CHLORIDE 20 MG/ML IJ SOLN
INTRAMUSCULAR | Status: AC
  Filled 2015-04-25: qty 1

## 2015-04-25 MED ORDER — ACETAMINOPHEN 500 MG PO TABS
1000.0000 mg | ORAL_TABLET | Freq: Four times a day (QID) | ORAL | Status: DC
  Administered 2015-04-26 – 2015-04-27 (×4): 1000 mg via ORAL
  Filled 2015-04-25 (×9): qty 2

## 2015-04-25 MED ORDER — VANCOMYCIN HCL IN DEXTROSE 1-5 GM/200ML-% IV SOLN
1000.0000 mg | Freq: Once | INTRAVENOUS | Status: AC
  Administered 2015-04-25: 1000 mg via INTRAVENOUS
  Filled 2015-04-25: qty 200

## 2015-04-25 MED ORDER — VECURONIUM BROMIDE 10 MG IV SOLR
INTRAVENOUS | Status: AC
Start: 2015-04-25 — End: 2015-04-25
  Filled 2015-04-25: qty 10

## 2015-04-25 MED ORDER — TRAMADOL HCL 50 MG PO TABS
50.0000 mg | ORAL_TABLET | ORAL | Status: DC | PRN
  Administered 2015-04-27: 100 mg via ORAL
  Filled 2015-04-25: qty 2

## 2015-04-25 MED ORDER — SODIUM CHLORIDE 0.9 % IV SOLN
250.0000 mL | INTRAVENOUS | Status: DC
  Administered 2015-04-26: 250 mL via INTRAVENOUS

## 2015-04-25 MED ORDER — METOPROLOL TARTRATE 25 MG/10 ML ORAL SUSPENSION
12.5000 mg | Freq: Two times a day (BID) | ORAL | Status: DC
  Filled 2015-04-25 (×2): qty 5

## 2015-04-25 MED ORDER — SODIUM CHLORIDE 0.9 % IJ SOLN: 3.0000 mL | INTRAMUSCULAR | Status: DC | PRN

## 2015-04-25 MED ORDER — ROCURONIUM BROMIDE 100 MG/10ML IV SOLN
INTRAVENOUS | Status: DC | PRN
  Administered 2015-04-25: 50 mg via INTRAVENOUS

## 2015-04-25 MED ORDER — SODIUM CHLORIDE 0.9 % IJ SOLN
3.0000 mL | INTRAMUSCULAR | Status: DC | PRN
  Administered 2015-04-25: 3 mL via INTRAVENOUS
  Filled 2015-04-25: qty 3

## 2015-04-25 MED ORDER — ASPIRIN 81 MG PO CHEW: 324.0000 mg | CHEWABLE_TABLET | Freq: Every day | ORAL | Status: DC

## 2015-04-25 MED ORDER — STERILE WATER FOR INJECTION IJ SOLN
INTRAMUSCULAR | Status: AC
  Filled 2015-04-25: qty 10

## 2015-04-25 MED ORDER — LACTATED RINGERS IV SOLN
INTRAVENOUS | Status: DC
  Administered 2015-04-25: 15:00:00 via INTRAVENOUS

## 2015-04-25 MED ORDER — PHENYLEPHRINE 40 MCG/ML (10ML) SYRINGE FOR IV PUSH (FOR BLOOD PRESSURE SUPPORT)
PREFILLED_SYRINGE | INTRAVENOUS | Status: AC
  Filled 2015-04-25: qty 10

## 2015-04-25 MED ORDER — ACETAMINOPHEN 650 MG RE SUPP
650.0000 mg | Freq: Once | RECTAL | Status: AC
  Administered 2015-04-25: 650 mg via RECTAL

## 2015-04-25 MED ORDER — ACETAMINOPHEN 160 MG/5ML PO SOLN
1000.0000 mg | Freq: Four times a day (QID) | ORAL | Status: DC
  Administered 2015-04-26: 1000 mg
  Filled 2015-04-25: qty 40.6

## 2015-04-25 MED ORDER — PHENYLEPHRINE HCL 10 MG/ML IJ SOLN
INTRAMUSCULAR | Status: DC | PRN
  Administered 2015-04-25 (×2): 40 ug via INTRAVENOUS

## 2015-04-25 MED ORDER — PROTAMINE SULFATE 10 MG/ML IV SOLN
INTRAVENOUS | Status: AC
  Filled 2015-04-25: qty 25

## 2015-04-25 MED ORDER — PROPOFOL 10 MG/ML IV BOLUS
INTRAVENOUS | Status: AC
  Filled 2015-04-25: qty 20

## 2015-04-25 MED ORDER — THROMBIN 20000 UNITS EX SOLR
OROMUCOSAL | Status: DC | PRN
  Administered 2015-04-25 (×3): 4 mL via TOPICAL

## 2015-04-25 MED ORDER — BISACODYL 10 MG RE SUPP: 10.0000 mg | Freq: Every day | RECTAL | Status: DC

## 2015-04-25 MED ORDER — SODIUM CHLORIDE 0.9 % IJ SOLN
3.0000 mL | Freq: Two times a day (BID) | INTRAMUSCULAR | Status: DC
  Administered 2015-04-26 (×2): 3 mL via INTRAVENOUS

## 2015-04-25 MED ORDER — PROPOFOL 10 MG/ML IV BOLUS
INTRAVENOUS | Status: DC | PRN
  Administered 2015-04-25: 40 mg via INTRAVENOUS

## 2015-04-25 MED ORDER — BISACODYL 5 MG PO TBEC
10.0000 mg | DELAYED_RELEASE_TABLET | Freq: Every day | ORAL | Status: DC
  Administered 2015-04-26: 10 mg via ORAL
  Filled 2015-04-25: qty 2

## 2015-04-25 MED ORDER — EPHEDRINE SULFATE 50 MG/ML IJ SOLN
INTRAMUSCULAR | Status: AC
  Filled 2015-04-25: qty 1

## 2015-04-25 MED ORDER — SODIUM CHLORIDE 0.9 % IJ SOLN
INTRAMUSCULAR | Status: AC
  Filled 2015-04-25: qty 20

## 2015-04-25 MED ORDER — FENTANYL CITRATE (PF) 100 MCG/2ML IJ SOLN
INTRAMUSCULAR | Status: DC | PRN
  Administered 2015-04-25 (×3): 250 ug via INTRAVENOUS
  Administered 2015-04-25: 50 ug via INTRAVENOUS
  Administered 2015-04-25: 200 ug via INTRAVENOUS
  Administered 2015-04-25: 500 ug via INTRAVENOUS
  Administered 2015-04-25: 200 ug via INTRAVENOUS
  Administered 2015-04-25: 50 ug via INTRAVENOUS

## 2015-04-25 MED ORDER — PROTAMINE SULFATE 10 MG/ML IV SOLN
INTRAVENOUS | Status: DC | PRN
  Administered 2015-04-25: 140 mg via INTRAVENOUS

## 2015-04-25 MED ORDER — MIDAZOLAM HCL 2 MG/2ML IJ SOLN: 2.0000 mg | INTRAMUSCULAR | Status: DC | PRN

## 2015-04-25 MED ORDER — HEPARIN SODIUM (PORCINE) 1000 UNIT/ML IJ SOLN
INTRAMUSCULAR | Status: AC
  Filled 2015-04-25: qty 1

## 2015-04-25 MED ORDER — HEMOSTATIC AGENTS (NO CHARGE) OPTIME
TOPICAL | Status: DC | PRN
  Administered 2015-04-25 (×2): 1 via TOPICAL

## 2015-04-25 MED ORDER — 0.9 % SODIUM CHLORIDE (POUR BTL) OPTIME
TOPICAL | Status: DC | PRN
  Administered 2015-04-25: 6000 mL

## 2015-04-25 MED ORDER — ONDANSETRON HCL 4 MG/2ML IJ SOLN
4.0000 mg | Freq: Four times a day (QID) | INTRAMUSCULAR | Status: DC | PRN
  Administered 2015-04-26 (×2): 4 mg via INTRAVENOUS
  Filled 2015-04-25 (×2): qty 2

## 2015-04-25 MED ORDER — PANTOPRAZOLE SODIUM 40 MG PO TBEC: 40.0000 mg | DELAYED_RELEASE_TABLET | Freq: Every day | ORAL | Status: DC

## 2015-04-25 MED ORDER — OXYCODONE HCL 5 MG PO TABS: 5.0000 mg | ORAL_TABLET | ORAL | Status: DC | PRN

## 2015-04-25 MED ORDER — LACTATED RINGERS IV SOLN: 500.0000 mL | Freq: Once | INTRAVENOUS | Status: DC | PRN

## 2015-04-25 MED ORDER — LIDOCAINE HCL (CARDIAC) 20 MG/ML IV SOLN
INTRAVENOUS | Status: AC
  Filled 2015-04-25: qty 5

## 2015-04-25 MED ORDER — MAGNESIUM SULFATE 4 GM/100ML IV SOLN
4.0000 g | Freq: Once | INTRAVENOUS | Status: AC
  Administered 2015-04-25: 4 g via INTRAVENOUS
  Filled 2015-04-25: qty 100

## 2015-04-25 MED ORDER — CHLORHEXIDINE GLUCONATE 0.12 % MT SOLN
15.0000 mL | OROMUCOSAL | Status: AC
  Administered 2015-04-25: 15 mL via OROMUCOSAL

## 2015-04-25 MED ORDER — ASPIRIN EC 325 MG PO TBEC
325.0000 mg | DELAYED_RELEASE_TABLET | Freq: Every day | ORAL | Status: DC
  Administered 2015-04-26: 325 mg via ORAL
  Filled 2015-04-25 (×2): qty 1

## 2015-04-25 MED ORDER — VECURONIUM BROMIDE 10 MG IV SOLR
INTRAVENOUS | Status: DC | PRN
  Administered 2015-04-25 (×3): 5 mg via INTRAVENOUS

## 2015-04-25 MED ORDER — PHENYLEPHRINE HCL 10 MG/ML IJ SOLN
0.0000 ug/min | INTRAVENOUS | Status: DC
  Filled 2015-04-25: qty 2

## 2015-04-25 MED ORDER — INSULIN REGULAR BOLUS VIA INFUSION
0.0000 [IU] | Freq: Three times a day (TID) | INTRAVENOUS | Status: DC
  Filled 2015-04-25: qty 10

## 2015-04-25 MED ORDER — SODIUM CHLORIDE 0.9 % IV SOLN
INTRAVENOUS | Status: DC
  Administered 2015-04-25: 1.7 [IU]/h via INTRAVENOUS
  Filled 2015-04-25: qty 2.5

## 2015-04-25 MED ORDER — DOCUSATE SODIUM 100 MG PO CAPS
200.0000 mg | ORAL_CAPSULE | Freq: Every day | ORAL | Status: DC
  Administered 2015-04-26: 200 mg via ORAL
  Filled 2015-04-25: qty 2

## 2015-04-25 MED ORDER — LACTATED RINGERS IV SOLN
INTRAVENOUS | Status: DC | PRN
  Administered 2015-04-25: 08:00:00 via INTRAVENOUS

## 2015-04-25 MED ORDER — SODIUM CHLORIDE 0.45 % IV SOLN: INTRAVENOUS | Status: DC | PRN

## 2015-04-25 MED ORDER — POTASSIUM CHLORIDE 10 MEQ/50ML IV SOLN: 10.0000 meq | INTRAVENOUS | Status: AC

## 2015-04-25 MED ORDER — THROMBIN 20000 UNITS EX SOLR
CUTANEOUS | Status: AC
  Filled 2015-04-25: qty 20000

## 2015-04-25 MED ORDER — SODIUM CHLORIDE 0.9 % IV SOLN
INTRAVENOUS | Status: DC
  Administered 2015-04-25: 15:00:00 via INTRAVENOUS
  Administered 2015-04-25: 20 mL/h via INTRAVENOUS

## 2015-04-25 MED ORDER — DEXMEDETOMIDINE HCL IN NACL 200 MCG/50ML IV SOLN
0.0000 ug/kg/h | INTRAVENOUS | Status: DC
  Administered 2015-04-25: 0.7 ug/kg/h via INTRAVENOUS
  Filled 2015-04-25: qty 50

## 2015-04-25 MED ORDER — ACETAMINOPHEN 160 MG/5ML PO SOLN: 650.0000 mg | Freq: Once | ORAL | Status: AC

## 2015-04-25 MED ORDER — LACTATED RINGERS IV SOLN
INTRAVENOUS | Status: DC
  Administered 2015-04-25: 09:00:00 via INTRAVENOUS

## 2015-04-25 MED ORDER — METOPROLOL TARTRATE 1 MG/ML IV SOLN: 2.5000 mg | INTRAVENOUS | Status: DC | PRN

## 2015-04-25 MED ORDER — PROTAMINE SULFATE 10 MG/ML IV SOLN
INTRAVENOUS | Status: AC
  Filled 2015-04-25: qty 5

## 2015-04-25 MED ORDER — MORPHINE SULFATE (PF) 2 MG/ML IV SOLN
1.0000 mg | INTRAVENOUS | Status: AC | PRN
  Administered 2015-04-25 – 2015-04-26 (×3): 2 mg via INTRAVENOUS
  Filled 2015-04-25 (×3): qty 1

## 2015-04-25 MED FILL — Heparin Sodium (Porcine) Inj 1000 Unit/ML: INTRAMUSCULAR | Qty: 10 | Status: AC

## 2015-04-25 MED FILL — Heparin Sodium (Porcine) Inj 1000 Unit/ML: INTRAMUSCULAR | Qty: 30 | Status: AC

## 2015-04-25 MED FILL — Lidocaine HCl IV Inj 20 MG/ML: INTRAVENOUS | Qty: 5 | Status: AC

## 2015-04-25 MED FILL — Magnesium Sulfate Inj 50%: INTRAMUSCULAR | Qty: 10 | Status: AC

## 2015-04-25 MED FILL — Potassium Chloride Inj 2 mEq/ML: INTRAVENOUS | Qty: 40 | Status: AC

## 2015-04-25 MED FILL — Electrolyte-R (PH 7.4) Solution: INTRAVENOUS | Qty: 4000 | Status: AC

## 2015-04-25 MED FILL — Sodium Chloride IV Soln 0.9%: INTRAVENOUS | Qty: 2000 | Status: AC

## 2015-04-25 MED FILL — Sodium Bicarbonate IV Soln 8.4%: INTRAVENOUS | Qty: 50 | Status: AC

## 2015-04-25 SURGICAL SUPPLY — 112 items
BAG DECANTER FOR FLEXI CONT (MISCELLANEOUS) ×4 IMPLANT
BANDAGE ELASTIC 4 VELCRO ST LF (GAUZE/BANDAGES/DRESSINGS) ×4 IMPLANT
BANDAGE ELASTIC 6 VELCRO ST LF (GAUZE/BANDAGES/DRESSINGS) ×4 IMPLANT
BASKET HEART  (ORDER IN 25'S) (MISCELLANEOUS) ×1
BASKET HEART (ORDER IN 25'S) (MISCELLANEOUS) ×1
BASKET HEART (ORDER IN 25S) (MISCELLANEOUS) ×2 IMPLANT
BLADE STERNUM SYSTEM 6 (BLADE) ×4 IMPLANT
BNDG GAUZE ELAST 4 BULKY (GAUZE/BANDAGES/DRESSINGS) ×4 IMPLANT
CANISTER SUCTION 2500CC (MISCELLANEOUS) ×4 IMPLANT
CATH ROBINSON RED A/P 18FR (CATHETERS) ×8 IMPLANT
CATH THORACIC 28FR (CATHETERS) ×4 IMPLANT
CATH THORACIC 36FR (CATHETERS) ×4 IMPLANT
CATH THORACIC 36FR RT ANG (CATHETERS) ×4 IMPLANT
CLIP TI MEDIUM 24 (CLIP) IMPLANT
CLIP TI WIDE RED SMALL 24 (CLIP) ×4 IMPLANT
COVER MAYO STAND STRL (DRAPES) ×4 IMPLANT
COVER SURGICAL LIGHT HANDLE (MISCELLANEOUS) ×4 IMPLANT
CRADLE DONUT ADULT HEAD (MISCELLANEOUS) ×4 IMPLANT
DRAPE CARDIOVASCULAR INCISE (DRAPES) ×2
DRAPE SLUSH/WARMER DISC (DRAPES) ×4 IMPLANT
DRAPE SRG 135X102X78XABS (DRAPES) ×2 IMPLANT
DRSG COVADERM 4X14 (GAUZE/BANDAGES/DRESSINGS) ×4 IMPLANT
ELECT CAUTERY BLADE 6.4 (BLADE) ×4 IMPLANT
ELECT REM PT RETURN 9FT ADLT (ELECTROSURGICAL) ×8
ELECTRODE REM PT RTRN 9FT ADLT (ELECTROSURGICAL) ×4 IMPLANT
GAUZE SPONGE 4X4 12PLY STRL (GAUZE/BANDAGES/DRESSINGS) ×8 IMPLANT
GLOVE BIO SURGEON STRL SZ 6 (GLOVE) IMPLANT
GLOVE BIO SURGEON STRL SZ 6.5 (GLOVE) ×18 IMPLANT
GLOVE BIO SURGEON STRL SZ7 (GLOVE) IMPLANT
GLOVE BIO SURGEON STRL SZ7.5 (GLOVE) ×12 IMPLANT
GLOVE BIO SURGEONS STRL SZ 6.5 (GLOVE) ×6
GLOVE BIOGEL PI IND STRL 6 (GLOVE) IMPLANT
GLOVE BIOGEL PI IND STRL 6.5 (GLOVE) ×8 IMPLANT
GLOVE BIOGEL PI IND STRL 7.0 (GLOVE) IMPLANT
GLOVE BIOGEL PI INDICATOR 6 (GLOVE)
GLOVE BIOGEL PI INDICATOR 6.5 (GLOVE) ×8
GLOVE BIOGEL PI INDICATOR 7.0 (GLOVE)
GLOVE EUDERMIC 7 POWDERFREE (GLOVE) ×8 IMPLANT
GLOVE ORTHO TXT STRL SZ7.5 (GLOVE) IMPLANT
GOWN STRL REUS W/ TWL LRG LVL3 (GOWN DISPOSABLE) ×8 IMPLANT
GOWN STRL REUS W/ TWL XL LVL3 (GOWN DISPOSABLE) ×2 IMPLANT
GOWN STRL REUS W/TWL LRG LVL3 (GOWN DISPOSABLE) ×8
GOWN STRL REUS W/TWL XL LVL3 (GOWN DISPOSABLE) ×2
HEMOSTAT POWDER SURGIFOAM 1G (HEMOSTASIS) ×12 IMPLANT
HEMOSTAT SURGICEL 2X14 (HEMOSTASIS) ×4 IMPLANT
INSERT FOGARTY 61MM (MISCELLANEOUS) IMPLANT
INSERT FOGARTY XLG (MISCELLANEOUS) IMPLANT
KIT BASIN OR (CUSTOM PROCEDURE TRAY) ×4 IMPLANT
KIT CATH CPB BARTLE (MISCELLANEOUS) ×4 IMPLANT
KIT ROOM TURNOVER OR (KITS) ×4 IMPLANT
KIT SUCTION CATH 14FR (SUCTIONS) ×4 IMPLANT
KIT VASOVIEW W/TROCAR VH 2000 (KITS) ×4 IMPLANT
NEEDLE 27GX1/2 REG BEVEL ECLIP (NEEDLE) ×4 IMPLANT
NS IRRIG 1000ML POUR BTL (IV SOLUTION) ×24 IMPLANT
PACK OPEN HEART (CUSTOM PROCEDURE TRAY) ×4 IMPLANT
PAD ARMBOARD 7.5X6 YLW CONV (MISCELLANEOUS) ×8 IMPLANT
PAD ELECT DEFIB RADIOL ZOLL (MISCELLANEOUS) ×4 IMPLANT
PENCIL BUTTON HOLSTER BLD 10FT (ELECTRODE) ×4 IMPLANT
PUNCH AORTIC ROTATE 4.0MM (MISCELLANEOUS) IMPLANT
PUNCH AORTIC ROTATE 4.5MM 8IN (MISCELLANEOUS) ×4 IMPLANT
PUNCH AORTIC ROTATE 5MM 8IN (MISCELLANEOUS) IMPLANT
SET CARDIOPLEGIA MPS 5001102 (MISCELLANEOUS) ×4 IMPLANT
SPONGE GAUZE 4X4 12PLY STER LF (GAUZE/BANDAGES/DRESSINGS) ×8 IMPLANT
SPONGE INTESTINAL PEANUT (DISPOSABLE) IMPLANT
SPONGE LAP 18X18 X RAY DECT (DISPOSABLE) ×4 IMPLANT
SPONGE LAP 4X18 X RAY DECT (DISPOSABLE) ×4 IMPLANT
SUT BONE WAX W31G (SUTURE) ×4 IMPLANT
SUT ETHIBOND 2 0 SH (SUTURE) ×8
SUT ETHIBOND 2 0 SH 36X2 (SUTURE) ×8 IMPLANT
SUT MNCRL AB 4-0 PS2 18 (SUTURE) IMPLANT
SUT PROLENE 3 0 SH DA (SUTURE) IMPLANT
SUT PROLENE 3 0 SH1 36 (SUTURE) ×4 IMPLANT
SUT PROLENE 4 0 RB 1 (SUTURE) ×2
SUT PROLENE 4 0 SH DA (SUTURE) IMPLANT
SUT PROLENE 4-0 RB1 .5 CRCL 36 (SUTURE) ×2 IMPLANT
SUT PROLENE 5 0 C 1 36 (SUTURE) ×8 IMPLANT
SUT PROLENE 6 0 C 1 30 (SUTURE) ×8 IMPLANT
SUT PROLENE 7 0 BV 1 (SUTURE) IMPLANT
SUT PROLENE 7 0 BV1 MDA (SUTURE) ×8 IMPLANT
SUT PROLENE 8 0 BV175 6 (SUTURE) ×8 IMPLANT
SUT SILK  1 MH (SUTURE) ×6
SUT SILK 1 MH (SUTURE) ×6 IMPLANT
SUT SILK 1 TIES 10X30 (SUTURE) ×4 IMPLANT
SUT SILK 2 0 SH CR/8 (SUTURE) ×12 IMPLANT
SUT SILK 2 0 TIES 10X30 (SUTURE) ×4 IMPLANT
SUT SILK 2 0 TIES 17X18 (SUTURE) ×2
SUT SILK 2-0 18XBRD TIE BLK (SUTURE) ×2 IMPLANT
SUT SILK 3 0 SH CR/8 (SUTURE) ×4 IMPLANT
SUT SILK 4 0 TIE 10X30 (SUTURE) ×8 IMPLANT
SUT STEEL STERNAL CCS#1 18IN (SUTURE) IMPLANT
SUT STEEL SZ 6 DBL 3X14 BALL (SUTURE) IMPLANT
SUT TEM PAC WIRE 2 0 SH (SUTURE) ×16 IMPLANT
SUT VIC AB 1 CTX 36 (SUTURE) ×6
SUT VIC AB 1 CTX36XBRD ANBCTR (SUTURE) ×6 IMPLANT
SUT VIC AB 2-0 CT1 27 (SUTURE) ×2
SUT VIC AB 2-0 CT1 TAPERPNT 27 (SUTURE) ×2 IMPLANT
SUT VIC AB 2-0 CTX 27 (SUTURE) ×8 IMPLANT
SUT VIC AB 3-0 SH 27 (SUTURE)
SUT VIC AB 3-0 SH 27X BRD (SUTURE) IMPLANT
SUT VIC AB 3-0 X1 27 (SUTURE) ×12 IMPLANT
SUT VICRYL 4-0 PS2 18IN ABS (SUTURE) IMPLANT
SUTURE E-PAK OPEN HEART (SUTURE) ×4 IMPLANT
SYRINGE 10CC LL (SYRINGE) ×4 IMPLANT
SYSTEM SAHARA CHEST DRAIN ATS (WOUND CARE) ×4 IMPLANT
TAPE CLOTH SURG 4X10 WHT LF (GAUZE/BANDAGES/DRESSINGS) ×4 IMPLANT
TAPE PAPER 2X10 WHT MICROPORE (GAUZE/BANDAGES/DRESSINGS) ×4 IMPLANT
TOWEL OR 17X24 6PK STRL BLUE (TOWEL DISPOSABLE) ×4 IMPLANT
TOWEL OR 17X26 10 PK STRL BLUE (TOWEL DISPOSABLE) ×4 IMPLANT
TRAY FOLEY IC TEMP SENS 16FR (CATHETERS) ×4 IMPLANT
TUBING INSUFFLATION (TUBING) ×4 IMPLANT
UNDERPAD 30X30 INCONTINENT (UNDERPADS AND DIAPERS) ×4 IMPLANT
WATER STERILE IRR 1000ML POUR (IV SOLUTION) ×8 IMPLANT

## 2015-04-25 NOTE — Progress Notes (Signed)
*  PRELIMINARY RESULTS* Echocardiogram Echocardiogram Transesophageal has been performed.  Leavy Cella 04/25/2015, 1:04 PM

## 2015-04-25 NOTE — Transfer of Care (Signed)
Immediate Anesthesia Transfer of Care Note  Patient: Carol [name]  Procedure(s) Performed: Procedure(s): CORONARY ARTERY BYPASS GRAFTING (CABG) x four,  using left internal mammary artery and right leg greater saphenous vein harvested endoscopically (N/A) TRANSESOPHAGEAL ECHOCARDIOGRAM (TEE) (N/A)  Patient Location: SICU  Anesthesia Type:General  Level of Consciousness: sedated and Patient remains intubated per anesthesia plan  Airway & Oxygen Therapy: Patient remains intubated per anesthesia plan and Patient placed on Ventilator (see vital sign flow sheet for setting)  Post-op Assessment: Report given to RN and Post -op Vital signs reviewed and stable  Post vital signs: Reviewed and stable  Last Vitals:  Filed Vitals:   04/25/15 0741  BP: 119/93  Pulse: 71  Temp: 37.3 C  Resp:     Complications: No apparent anesthesia complications

## 2015-04-25 NOTE — Anesthesia Procedure Notes (Signed)
Procedure Name: Intubation Date/Time: 04/25/2015 9:46 AM Performed by: Jacquiline Doe A Pre-anesthesia Checklist: Patient identified, Timeout performed, Emergency Drugs available, Suction available and Patient being monitored Patient Re-evaluated:Patient Re-evaluated prior to inductionOxygen Delivery Method: Circle system utilized Preoxygenation: Pre-oxygenation with 100% oxygen Intubation Type: IV induction and Cricoid Pressure applied Ventilation: Mask ventilation without difficulty and Oral airway inserted - appropriate to patient size Laryngoscope Size: Mac and 4 Grade View: Grade I Tube type: Oral Tube size: 8.0 mm Number of attempts: 1 Airway Equipment and Method: Stylet Placement Confirmation: ETT inserted through vocal cords under direct vision,  breath sounds checked- equal and bilateral and positive ETCO2 Secured at: 22 cm Tube secured with: Tape Dental Injury: Teeth and Oropharynx as per pre-operative assessment

## 2015-04-25 NOTE — Progress Notes (Addendum)
Patietn has been switched from weaning mode to previous mode of SIMV, PRVC, PSV  Vt 610, 50%, PEEP  5, and Rate 12, due to patient failing parameters. Will try wean when patient becomes more awake and able.  I concur with previous assessment.

## 2015-04-25 NOTE — Progress Notes (Signed)
First trial NIF -32, VC .3L

## 2015-04-25 NOTE — Anesthesia Preprocedure Evaluation (Signed)
Anesthesia Evaluation  Patient identified by MRN, date of birth, ID band Patient awake    Reviewed: Allergy & Precautions, NPO status , Patient's Chart, lab work & pertinent test results, reviewed documented beta blocker date and time   History of Anesthesia Complications Negative for: history of anesthetic complications  Airway Mallampati: II  TM Distance: >3 FB Neck ROM: Full    Dental  (+) Edentulous Upper, Edentulous Lower   Pulmonary neg shortness of breath, asthma , neg sleep apnea, neg COPD, neg recent URI, neg PE   breath sounds clear to auscultation       Cardiovascular hypertension, Pt. on medications + angina + CAD, + Past MI and +CHF  + dysrhythmias  Rhythm:Regular     Neuro/Psych neg Seizures TIAnegative psych ROS   GI/Hepatic Neg liver ROS, GERD  Medicated and Controlled,  Endo/Other  diabetes, Type 2, Insulin DependentHypothyroidism   Renal/GU Renal InsufficiencyRenal disease     Musculoskeletal   Abdominal   Peds  Hematology  (+) anemia ,   Anesthesia Other Findings   Reproductive/Obstetrics                             Anesthesia Physical Anesthesia Plan  ASA: IV  Anesthesia Plan: General   Post-op Pain Management:    Induction: Intravenous  Airway Management Planned: Oral ETT  Additional Equipment: Arterial line, TEE, CVP, PA Cath and Ultrasound Guidance Line Placement  Intra-op Plan:   Post-operative Plan: Post-operative intubation/ventilation  Informed Consent: I have reviewed the patients History and Physical, chart, labs and discussed the procedure including the risks, benefits and alternatives for the proposed anesthesia with the patient or authorized representative who has indicated his/her understanding and acceptance.     Plan Discussed with: CRNA and Surgeon  Anesthesia Plan Comments:         Anesthesia Quick Evaluation

## 2015-04-25 NOTE — Progress Notes (Addendum)
RT attempted wean for second time. Patient failing wean at this time due to decreased RR and minute ventilation. Patient remains sleepy. Will attempt at a later time when patient is more alert and awake.  I concur with previous assessment

## 2015-04-25 NOTE — Progress Notes (Signed)
Pt's belongings, including hearing aid and dentures, given to pt's family before transported to pre-op

## 2015-04-25 NOTE — OR Nursing (Signed)
13:15 - 45 minute call to SICU charge nurse, 13:45 - 20 minute call to SICU nurse

## 2015-04-25 NOTE — Op Note (Signed)
CARDIOVASCULAR SURGERY OPERATIVE NOTE  04/25/2015  Surgeon:  Gaye Pollack, MD  First Assistant: Jadene Pierini,  PA-C   Preoperative Diagnosis:  Left main and  multi-vessel coronary artery disease   Postoperative Diagnosis:  Same   Procedure:  1. Median Sternotomy 2. Extracorporeal circulation 3.   Coronary artery bypass grafting x 4   Left internal mammary graft to the LAD  Sequential SVG to OM2 and OM3  SVG to distal RCA  4.   Endoscopic vein harvest from the right leg   Anesthesia:  General Endotracheal   Clinical History/Surgical Indication:  The patient is a 79 year old woman with hypertension, hyperlipidemia, poorly controlled DM and coronary disease s/p PTCA of the OM2 on 01/22/2014 after NSTEMI. She continued to have chest pain after that and a stress myoview showed a perfusion defect in the lateral wall so she underwent repeat cath on 02/17/2014 showing severe restenosis of the OM2 successfully treated with PTCA/DES x 2. She had recurrent CP and a slight elevation of her troponin and underwent PTCA of the small PDA that had a 99% stenosis. Stent was not placed due to small vessel size. She says that she has had intermittent exertional chest pain since then but has cut back her activity to minimize it. She underwent spine surgery in June 2016 for spinal stenosis. She now presents with progressive angina with CP occuring with minimal exertion like walking around in her house, associated with fatigue. Cardiac cath yesterday showed some abnormality of the mid to distal LM but difficult to tell if it was stenosis or contrast streaming. The previous OM stents were patent. The RCA has a new 70% mid vessel stenosis. LVEF 50%. She had a cardiac CT today to examine the left main further which shows a large area of calcium at the ostium and proximal LM with an area of soft plaque with a lucent  core in the mid to distal LM with 50-75% stenosis. The ostium of the LAD is heavily calcified.  This 79 year old poorly controlled diabetic has progressive exertional angina that is CCS class 3-4 and has a hazy mid to distal LM stenosis that has soft plaque with a lucent core and 50-70% stenosis suggesting that it is a high risk lesion, as well as an 80% mid RCA stenosis. I agree that CABG is the best treatment for her followed by cardiovascular risk factor reduction. I discussed the operative procedure with the patient and family including alternatives, benefits and risks; including but not limited to bleeding, blood transfusion, infection, stroke, myocardial infarction, graft failure, heart block requiring a permanent pacemaker, organ dysfunction, and death. Carol [name] understands and agrees to proceed.     Preparation:  The patient was seen in the preoperative holding area and the correct patient, correct operation were confirmed with the patient after reviewing the medical record and catheterization. The consent was signed by me. Preoperative antibiotics were given. A pulmonary arterial line and radial arterial line were placed by the anesthesia team. The patient was taken back to the operating room and positioned supine on the operating room table. After being placed under general endotracheal anesthesia by the anesthesia team a foley catheter was placed. The neck, chest, abdomen, and both legs were prepped with betadine soap and solution and draped in the usual sterile manner. A surgical time-out was taken and the correct patient and operative procedure were confirmed with the nursing and anesthesia staff.   Cardiopulmonary Bypass:  A median sternotomy was  performed. The pericardium was opened in the midline. Right ventricular function appeared normal. The ascending aorta was of normal size and had no palpable plaque. There were no contraindications to aortic cannulation or  cross-clamping. The patient was fully systemically heparinized and the ACT was maintained > 400 sec. The proximal aortic arch was cannulated with a 20 F aortic cannula for arterial inflow. Venous cannulation was performed via the right atrial appendage using a two-staged venous cannula. An antegrade cardioplegia/vent cannula was inserted into the mid-ascending aorta. Aortic occlusion was performed with a single cross-clamp. Systemic cooling to 32 degrees Centigrade and topical cooling of the heart with iced saline were used. Hyperkalemic antegrade cold blood cardioplegia was used to induce diastolic arrest and was then given at about 20 minute intervals throughout the period of arrest to maintain myocardial temperature at or below 10 degrees centigrade. A temperature probe was inserted into the interventricular septum and an insulating pad was placed in the pericardium.   Left internal mammary harvest:  The left side of the sternum was retracted using the Rultract retractor. The left internal mammary artery was harvested as a pedicle graft. All side branches were clipped. It was a medium-sized vessel of good quality with excellent blood flow. It was ligated distally and divided. It was sprayed with topical papaverine solution to prevent vasospasm.   Endoscopic vein harvest:  The right greater saphenous vein was harvested endoscopically through a 2 cm incision medial to the right knee. It was harvested from the upper thigh to below the knee. It was a medium-sized vein of good quality. The side branches were all ligated with 4-0 silk ties.    Coronary arteries:  The coronary arteries were examined.   LAD:  Large vessel with no mid or distal vessel disease  LCX:  OM 1 small with no visible disease. OM2 with stent in mid portion. There was heavy plaque present throughout the stented segment. Beyond the stent the vessel had no disease. The OM3 was a large vessel with no distal disease.  RCA:   Diffusely diseased throughtout the proximal and mid portion. Distally there was still moderate plaque as far out as could be exposed. The PL branch was very small with no visible disease in the mid or distal portion.    Grafts:  1. LIMA to the LAD: 2.0 mm. It was sewn end to side using 8-0 prolene continuous suture. 2. SVG to distal RCA:  2.5 mm. It was sewn end to side using 7-0 prolene continuous suture. 3. Sequential SVG to OM2:  1.75 mm. It was sewn sequential side to side using 7-0 prolene continuous suture. 4. Sequential SVG to OM3:  1.75 mm. It was sewn sequential end to side using 7-0 prolene continuous suture.  The proximal vein graft anastomoses were performed to the mid-ascending aorta using continuous 6-0 prolene suture. Graft markers were placed around the proximal anastomoses.   Completion:  The patient was rewarmed to 37 degrees Centigrade. The clamp was removed from the LIMA pedicle and there was rapid warming of the septum and return of ventricular fibrillation. The crossclamp was removed with a time of 78 minutes. There was spontaneous return of sinus rhythm. The distal and proximal anastomoses were checked for hemostasis. The position of the grafts was satisfactory. Two temporary epicardial pacing wires were placed on the right atrium and two on the right ventricle. The patient was weaned from CPB without difficulty on no inotropes. CPB time was 96 minutes. Cardiac output was 6  LPM. Heparin was fully reversed with protamine and the aortic and venous cannulas removed. Hemostasis was achieved. Mediastinal and left pleural drainage tubes were placed. The sternum was closed with  #6 stainless steel wires. The fascia was closed with continuous # 1 vicryl suture. The subcutaneous tissue was closed with 2-0 vicryl continuous suture. The skin was closed with 3-0 vicryl subcuticular suture. All sponge, needle, and instrument counts were reported correct at the end of the case. Dry sterile  dressings were placed over the incisions and around the chest tubes which were connected to pleurevac suction. The patient was then transported to the surgical intensive care unit in critical but stable condition.

## 2015-04-25 NOTE — Brief Op Note (Signed)
04/19/2015 - 04/25/2015      [address]       [place] [address]             [phone]     04/19/2015 - 04/25/2015  12:45 PM  PATIENT:  Carol [name]  79 y.o. female  PRE-OPERATIVE DIAGNOSIS:  CAD LEFT MAIN DISEASE  POST-OPERATIVE DIAGNOSIS:  CAD LEFT MAIN DISEASE  PROCEDURE:  Procedure(s): CORONARY ARTERY BYPASS GRAFTING (CABG) x 4  using left internal mammary artery and right leg greater saphenous vein harvested endoscopically(EVH). LIMA-LAD; SEQ SVG-OM1-OM2; SVG-PD TRANSESOPHAGEAL ECHOCARDIOGRAM (TEE)  SURGEON:  Surgeon(s): Gaye Pollack, MD  PHYSICIAN ASSISTANT: WAYNE GOLD PA-C  ANESTHESIA:   general  PATIENT CONDITION:  ICU - intubated and hemodynamically stable.  PRE-OPERATIVE WEIGHT: 96kg  TRANSFUSION/EBL: SEE ANESTH/PERFUSION RECORDS

## 2015-04-25 NOTE — Progress Notes (Signed)
      [address]       [place] [address]             [phone]      Intubated, sedated  BP 133/74 mmHg  Pulse 88  Temp(Src) 97.2 F (36.2 C) (Oral)  Resp 19  Ht 5\' 7"  (1.702 m)  Wt 212 lb 8 oz (96.389 kg)  BMI 33.27 kg/m2  SpO2 100%  29/17 CI up to 1.5 from 1.2   Intake/Output Summary (Last 24 hours) at 04/25/15 1805 Last data filed at 04/25/15 1700  Gross per 24 hour  Intake 3712.98 ml  Output   2845 ml  Net 867.98 ml    Doing well early postop  Still appears a little intravascularly dry but will try to minimize fluids as much as possible  Remo Lipps C. Roxan Hockey, MD [facility] [phone]

## 2015-04-26 ENCOUNTER — Encounter (HOSPITAL_COMMUNITY): Payer: Self-pay | Admitting: Surgery

## 2015-04-26 ENCOUNTER — Inpatient Hospital Stay (HOSPITAL_COMMUNITY): Payer: Medicare Other

## 2015-04-26 LAB — POCT I-STAT 3, ART BLOOD GAS (G3+)
Acid-base deficit: 2 mmol/L (ref 0.0–2.0)
Acid-base deficit: 2 mmol/L (ref 0.0–2.0)
Bicarbonate: 20.4 mEq/L (ref 20.0–24.0)
Bicarbonate: 22.5 mEq/L (ref 20.0–24.0)
O2 SAT: 99 %
O2 SAT: 99 %
PCO2 ART: 27.6 mmHg — AB (ref 35.0–45.0)
PCO2 ART: 36.6 mmHg (ref 35.0–45.0)
PO2 ART: 130 mmHg — AB (ref 80.0–100.0)
PO2 ART: 136 mmHg — AB (ref 80.0–100.0)
Patient temperature: 37.2
Patient temperature: 36.9
TCO2: 21 mmol/L (ref 0–100)
TCO2: 24 mmol/L (ref 0–100)
pH, Arterial: 7.478 — ABNORMAL HIGH (ref 7.350–7.450)
pH, Arterial: 7.396 (ref 7.350–7.450)

## 2015-04-26 LAB — BASIC METABOLIC PANEL
ANION GAP: 8 (ref 5–15)
BUN: 17 mg/dL (ref 6–20)
CALCIUM: 8 mg/dL — AB (ref 8.9–10.3)
CO2: 21 mmol/L — ABNORMAL LOW (ref 22–32)
Chloride: 106 mmol/L (ref 101–111)
Creatinine, Ser: 1.04 mg/dL — ABNORMAL HIGH (ref 0.44–1.00)
GFR calc Af Amer: 59 mL/min — ABNORMAL LOW (ref >60)
GFR, EST NON AFRICAN AMERICAN: 50 mL/min — AB (ref >60)
GLUCOSE: 129 mg/dL — AB (ref 65–99)
Potassium: 3.8 mmol/L (ref 3.5–5.1)
Sodium: 135 mmol/L (ref 135–145)

## 2015-04-26 LAB — MAGNESIUM
Magnesium: 2.6 mg/dL — ABNORMAL HIGH (ref 1.7–2.4)
Magnesium: 2.5 mg/dL — ABNORMAL HIGH (ref 1.7–2.4)

## 2015-04-26 LAB — POCT I-STAT, CHEM 8
BUN: 16 mg/dL (ref 6–20)
CREATININE: 0.9 mg/dL (ref 0.44–1.00)
Calcium, Ion: 1.22 mmol/L (ref 1.13–1.30)
Chloride: 104 mmol/L (ref 101–111)
GLUCOSE: 192 mg/dL — AB (ref 65–99)
HCT: 34 % — ABNORMAL LOW (ref 36.0–46.0)
HEMOGLOBIN: 11.6 g/dL — AB (ref 12.0–15.0)
Potassium: 4.3 mmol/L (ref 3.5–5.1)
Sodium: 137 mmol/L (ref 135–145)
TCO2: 20 mmol/L (ref 0–100)

## 2015-04-26 LAB — GLUCOSE, CAPILLARY
GLUCOSE-CAPILLARY: 167 mg/dL — AB (ref 65–99)
GLUCOSE-CAPILLARY: 234 mg/dL — AB (ref 65–99)
GLUCOSE-CAPILLARY: 134 mg/dL — AB (ref 65–99)
GLUCOSE-CAPILLARY: 109 mg/dL — AB (ref 65–99)
GLUCOSE-CAPILLARY: 129 mg/dL — AB (ref 65–99)
GLUCOSE-CAPILLARY: 117 mg/dL — AB (ref 65–99)
Glucose-Capillary: 155 mg/dL — ABNORMAL HIGH (ref 65–99)
Glucose-Capillary: 128 mg/dL — ABNORMAL HIGH (ref 65–99)
Glucose-Capillary: 110 mg/dL — ABNORMAL HIGH (ref 65–99)
Glucose-Capillary: 127 mg/dL — ABNORMAL HIGH (ref 65–99)
Glucose-Capillary: 126 mg/dL — ABNORMAL HIGH (ref 65–99)
Glucose-Capillary: 123 mg/dL — ABNORMAL HIGH (ref 65–99)
Glucose-Capillary: 124 mg/dL — ABNORMAL HIGH (ref 65–99)
Glucose-Capillary: 122 mg/dL — ABNORMAL HIGH (ref 65–99)
Glucose-Capillary: 188 mg/dL — ABNORMAL HIGH (ref 65–99)
Glucose-Capillary: 186 mg/dL — ABNORMAL HIGH (ref 65–99)
Glucose-Capillary: 216 mg/dL — ABNORMAL HIGH (ref 65–99)

## 2015-04-26 LAB — CREATININE, SERUM
Creatinine, Ser: 1.01 mg/dL — ABNORMAL HIGH (ref 0.44–1.00)
GFR calc Af Amer: >60 mL/min (ref >60)
GFR calc non Af Amer: 52 mL/min — ABNORMAL LOW (ref >60)

## 2015-04-26 LAB — HEMOGLOBIN A1C
HEMOGLOBIN A1C: 8.1 % — AB (ref 4.8–5.6)
MEAN PLASMA GLUCOSE: 186 mg/dL

## 2015-04-26 LAB — CBC
HCT: 31.4 % — ABNORMAL LOW (ref 36.0–46.0)
HCT: 31.8 % — ABNORMAL LOW (ref 36.0–46.0)
Hemoglobin: 10.4 g/dL — ABNORMAL LOW (ref 12.0–15.0)
Hemoglobin: 10.5 g/dL — ABNORMAL LOW (ref 12.0–15.0)
MCH: 29.1 pg (ref 26.0–34.0)
MCH: 29.2 pg (ref 26.0–34.0)
MCHC: 33.1 g/dL (ref 30.0–36.0)
MCHC: 33 g/dL (ref 30.0–36.0)
MCV: 87.7 fL (ref 78.0–100.0)
MCV: 88.3 fL (ref 78.0–100.0)
PLATELETS: 134 10*3/uL — AB (ref 150–400)
PLATELETS: 134 10*3/uL — AB (ref 150–400)
RBC: 3.58 MIL/uL — ABNORMAL LOW (ref 3.87–5.11)
RBC: 3.6 MIL/uL — ABNORMAL LOW (ref 3.87–5.11)
RDW: 15.3 % (ref 11.5–15.5)
RDW: 15.7 % — AB (ref 11.5–15.5)
WBC: 9.1 10*3/uL (ref 4.0–10.5)
WBC: 9.6 10*3/uL (ref 4.0–10.5)

## 2015-04-26 MED ORDER — INSULIN DETEMIR 100 UNIT/ML ~~LOC~~ SOLN
20.0000 [IU] | Freq: Every day | SUBCUTANEOUS | Status: DC
  Administered 2015-04-26 – 2015-04-30 (×5): 20 [IU] via SUBCUTANEOUS
  Filled 2015-04-26 (×5): qty 0.2

## 2015-04-26 MED ORDER — INSULIN DETEMIR 100 UNIT/ML ~~LOC~~ SOLN
10.0000 [IU] | Freq: Once | SUBCUTANEOUS | Status: AC
  Administered 2015-04-26: 10 [IU] via SUBCUTANEOUS
  Filled 2015-04-26 (×2): qty 0.1

## 2015-04-26 MED ORDER — CHLORHEXIDINE GLUCONATE 0.12 % MT SOLN
15.0000 mL | Freq: Two times a day (BID) | OROMUCOSAL | Status: DC
  Administered 2015-04-26: 15 mL via OROMUCOSAL
  Filled 2015-04-26: qty 15

## 2015-04-26 MED ORDER — ALLOPURINOL 300 MG PO TABS
300.0000 mg | ORAL_TABLET | Freq: Every day | ORAL | Status: DC
  Administered 2015-04-26 – 2015-04-30 (×5): 300 mg via ORAL
  Filled 2015-04-26 (×2): qty 1
  Filled 2015-04-26: qty 3
  Filled 2015-04-26 (×2): qty 1

## 2015-04-26 MED ORDER — CETYLPYRIDINIUM CHLORIDE 0.05 % MT LIQD
7.0000 mL | Freq: Two times a day (BID) | OROMUCOSAL | Status: DC
  Administered 2015-04-26: 7 mL via OROMUCOSAL

## 2015-04-26 MED ORDER — CETYLPYRIDINIUM CHLORIDE 0.05 % MT LIQD: 7.0000 mL | Freq: Two times a day (BID) | OROMUCOSAL | Status: DC

## 2015-04-26 MED ORDER — INSULIN ASPART 100 UNIT/ML ~~LOC~~ SOLN
0.0000 [IU] | SUBCUTANEOUS | Status: DC
  Administered 2015-04-26 (×2): 4 [IU] via SUBCUTANEOUS
  Administered 2015-04-26: 8 [IU] via SUBCUTANEOUS
  Administered 2015-04-27: 4 [IU] via SUBCUTANEOUS
  Administered 2015-04-27: 2 [IU] via SUBCUTANEOUS

## 2015-04-26 MED ORDER — INSULIN DETEMIR 100 UNIT/ML ~~LOC~~ SOLN: 20.0000 [IU] | Freq: Every day | SUBCUTANEOUS | Status: DC

## 2015-04-26 MED ORDER — CARVEDILOL 3.125 MG PO TABS
3.1250 mg | ORAL_TABLET | Freq: Two times a day (BID) | ORAL | Status: DC
  Administered 2015-04-26 – 2015-04-30 (×9): 3.125 mg via ORAL
  Filled 2015-04-26 (×11): qty 1

## 2015-04-26 NOTE — Progress Notes (Signed)
1 Day Post-Op Procedure(s) (LRB): CORONARY ARTERY BYPASS GRAFTING (CABG) x four,  using left internal mammary artery and right leg greater saphenous vein harvested endoscopically (N/A) TRANSESOPHAGEAL ECHOCARDIOGRAM (TEE) (N/A) Subjective:  No complaints  Objective: Vital signs in last 24 hours: Temp:  [96.8 F (36 C)-99.9 F (37.7 C)] 98.4 F (36.9 C) (11/01 0700) Pulse Rate:  [71-89] 79 (11/01 0700) Cardiac Rhythm:  [-] Normal sinus rhythm;Heart block (10/31 2300) Resp:  [0-23] 17 (11/01 0700) BP: (83-150)/(50-93) 141/61 mmHg (11/01 0700) SpO2:  [94 %-100 %] 99 % (11/01 0700) Arterial Line BP: (109-188)/(59-79) 173/65 mmHg (11/01 0700) FiO2 (%):  [40 %-50 %] 40 % (11/01 0220) Weight:  [101.5 kg (223 lb 12.3 oz)] 101.5 kg (223 lb 12.3 oz) (11/01 0500)  Hemodynamic parameters for last 24 hours: PAP: (22-34)/(14-23) 24/15 mmHg CO:  [2.5 L/min-4.4 L/min] 4.4 L/min CI:  [1.2 L/min/m2-2.1 L/min/m2] 2.1 L/min/m2  Intake/Output from previous day: 10/31 0701 - 11/01 0700 In: 4604.3 [I.V.:2764.3; Blood:500; NG/GT:90; IV Piggyback:1250] Out: [id number] [Urine:3180; Blood:1000; Chest Tube:315] Intake/Output this shift:    General appearance: alert and cooperative Neurologic: intact Heart: regular rate and rhythm, S1, S2 normal, no murmur, click, rub or gallop Lungs: clear to auscultation bilaterally Extremities: edema mild Wound: dressings dry  Lab Results:  Recent Labs  04/25/15 2113 04/26/15 0256  WBC 9.3 9.1  HGB 11.5* 10.4*  HCT 34.0* 31.4*  PLT 126* 134*   BMET:  Recent Labs  04/25/15 0300  04/25/15 2110 04/25/15 2113 04/26/15 0256  NA 137  < > 138  --  135  K 5.2*  < > 4.5  --  3.8  CL 102  < > 105  --  106  CO2 27  --   --   --  21*  GLUCOSE 217*  < > 105*  --  129*  BUN 34*  < > 23*  --  17  CREATININE 1.47*  < > 1.10* 1.11* 1.04*  CALCIUM 9.4  --   --   --  8.0*  < > = values in this interval not displayed.  PT/INR:  Recent Labs  04/25/15 1500  LABPROT  16.2*  INR 1.28   ABG    Component Value Date/Time   PHART 7.396 04/26/2015 0414   HCO3 22.5 04/26/2015 0414   TCO2 24 04/26/2015 0414   ACIDBASEDEF 2.0 04/26/2015 0414   O2SAT 99.0 04/26/2015 0414   CBG (last 3)   Recent Labs  04/26/15 0358 04/26/15 0602 04/26/15 0659  GLUCAP 109* 127* 126*   CXR: clear  Assessment/Plan: S/P Procedure(s) (LRB): CORONARY ARTERY BYPASS GRAFTING (CABG) x four,  using left internal mammary artery and right leg greater saphenous vein harvested endoscopically (N/A) TRANSESOPHAGEAL ECHOCARDIOGRAM (TEE) (N/A)  She is hemodynamically stable and hypertensive on NTG. Will resume Coreg this am. She has first degree AV block. Observe for now. Mobilize Diabetes control: Preop Hgb A1c was 8.1. Start Levemir and SSI to get off insulin drip. Chronic kidney disease: stage 3. Creat below baseline preop. Observe. Will start diuresis tomorrow if creat stable. d/c tubes/lines Continue foley due to patient in ICU and urinary output monitoring See progression orders   LOS: 6 days    Gaye Pollack 04/26/2015

## 2015-04-26 NOTE — Procedures (Signed)
Extubation Procedure Note  Patient Details:   Name: Carol [name] DOB: January 16, 1936 MRN: [id number]   Airway Documentation:  Airway 8 mm (Active)  Secured at (cm) 21 cm 04/25/2015 11:10 PM  Measured From Lips 04/25/2015 11:10 PM  Secured Location Right 04/25/2015 11:10 PM  Secured By Pink Tape 04/25/2015 11:10 PM  Site Condition Dry 04/25/2015 11:10 PM    Evaluation  O2 sats: stable throughout Complications: No apparent complications Patient did tolerate procedure well. Bilateral Breath Sounds: Clear Suctioning: Airway Yes   Patient was extubated without any complications and placed on 4LNC. Patient is able to speak and give a strong cough. Patient is stable at this time.  Cordelia Pen C 04/26/2015, 3:00 AM

## 2015-04-26 NOTE — Plan of Care (Signed)
Problem: Phase II - Intermediate Post-Op Goal: Wean to Extubate Outcome: Completed/Met Date Met:  04/26/15 Pt weaned to extubate after 3 attempts at rapid wean protocol; Pt able to perform C/DB exercises with IS 1250 x 4. Will continue to monitor.     

## 2015-04-26 NOTE — Progress Notes (Signed)
Patient ID: Carol [name], female   DOB: [birth year], 79 y.o.   MRN: [id number]  SICU Evening Rounds:  Hemodynamically stable  Urine output good.  Glucose up to 216 this pm so will give an extra dose of Levemir tonight.  BMET    Component Value Date/Time   NA 137 04/26/2015 1548   K 4.3 04/26/2015 1548   CL 104 04/26/2015 1548   CO2 21* 04/26/2015 0256   GLUCOSE 192* 04/26/2015 1548   BUN 16 04/26/2015 1548   CREATININE 1.01* 04/26/2015 1559   CALCIUM 8.0* 04/26/2015 0256   GFRNONAA 52* 04/26/2015 1559   GFRAA >60 04/26/2015 1559    CBC    Component Value Date/Time   WBC 9.6 04/26/2015 1559   RBC 3.60* 04/26/2015 1559   HGB 10.5* 04/26/2015 1559   HCT 31.8* 04/26/2015 1559   PLT 134* 04/26/2015 1559   MCV 88.3 04/26/2015 1559   MCH 29.2 04/26/2015 1559   MCHC 33.0 04/26/2015 1559   RDW 15.7* 04/26/2015 1559   LYMPHSABS 2.0 04/19/2015 1735   MONOABS 0.5 04/19/2015 1735   EOSABS 0.1 04/19/2015 1735   BASOSABS 0.0 04/19/2015 1735    Ambulated today.

## 2015-04-26 NOTE — Care Management Important Message (Signed)
Important Message  Patient Details  Name: Carol [name] MRN: [id number] Date of Birth: 05-17-1936   Medicare Important Message Given:  Yes-second notification given    Nathen May 04/26/2015, 10:07 AM

## 2015-04-26 NOTE — Progress Notes (Signed)
NIF -30, VC 1.1L

## 2015-04-27 ENCOUNTER — Inpatient Hospital Stay (HOSPITAL_COMMUNITY): Payer: Medicare Other

## 2015-04-27 LAB — GLUCOSE, CAPILLARY
GLUCOSE-CAPILLARY: 114 mg/dL — AB (ref 65–99)
GLUCOSE-CAPILLARY: 159 mg/dL — AB (ref 65–99)
GLUCOSE-CAPILLARY: 139 mg/dL — AB (ref 65–99)
GLUCOSE-CAPILLARY: 111 mg/dL — AB (ref 65–99)
Glucose-Capillary: 160 mg/dL — ABNORMAL HIGH (ref 65–99)
Glucose-Capillary: 43 mg/dL — CL (ref 65–99)
Glucose-Capillary: 65 mg/dL (ref 65–99)
Glucose-Capillary: 166 mg/dL — ABNORMAL HIGH (ref 65–99)

## 2015-04-27 LAB — BASIC METABOLIC PANEL
Anion gap: 7 (ref 5–15)
BUN: 15 mg/dL (ref 6–20)
CO2: 25 mmol/L (ref 22–32)
Calcium: 8.3 mg/dL — ABNORMAL LOW (ref 8.9–10.3)
Chloride: 106 mmol/L (ref 101–111)
Creatinine, Ser: 1 mg/dL (ref 0.44–1.00)
GFR calc Af Amer: >60 mL/min (ref >60)
GFR, EST NON AFRICAN AMERICAN: 53 mL/min — AB (ref >60)
GLUCOSE: 49 mg/dL — AB (ref 65–99)
Potassium: 3.8 mmol/L (ref 3.5–5.1)
Sodium: 138 mmol/L (ref 135–145)

## 2015-04-27 LAB — CBC
HEMATOCRIT: 30.1 % — AB (ref 36.0–46.0)
Hemoglobin: 9.9 g/dL — ABNORMAL LOW (ref 12.0–15.0)
MCH: 29.3 pg (ref 26.0–34.0)
MCHC: 32.9 g/dL (ref 30.0–36.0)
MCV: 89.1 fL (ref 78.0–100.0)
Platelets: 132 10*3/uL — ABNORMAL LOW (ref 150–400)
RBC: 3.38 MIL/uL — ABNORMAL LOW (ref 3.87–5.11)
RDW: 15.9 % — AB (ref 11.5–15.5)
WBC: 9.4 10*3/uL (ref 4.0–10.5)

## 2015-04-27 MED ORDER — MOVING RIGHT ALONG BOOK
Freq: Once | Status: AC
  Administered 2015-04-27: 10:00:00
  Filled 2015-04-27: qty 1

## 2015-04-27 MED ORDER — ONDANSETRON HCL 4 MG PO TABS: 4.0000 mg | ORAL_TABLET | Freq: Four times a day (QID) | ORAL | Status: DC | PRN

## 2015-04-27 MED ORDER — OXYCODONE HCL 5 MG PO TABS
5.0000 mg | ORAL_TABLET | ORAL | Status: DC | PRN
  Administered 2015-04-28: 10 mg via ORAL
  Filled 2015-04-27: qty 2
  Filled 2015-04-27: qty 1

## 2015-04-27 MED ORDER — ACETAMINOPHEN 325 MG PO TABS
650.0000 mg | ORAL_TABLET | Freq: Four times a day (QID) | ORAL | Status: DC | PRN
  Administered 2015-04-27 – 2015-04-28 (×3): 650 mg via ORAL
  Filled 2015-04-27 (×3): qty 2

## 2015-04-27 MED ORDER — POTASSIUM CHLORIDE CRYS ER 20 MEQ PO TBCR
20.0000 meq | EXTENDED_RELEASE_TABLET | Freq: Two times a day (BID) | ORAL | Status: AC
  Administered 2015-04-27 – 2015-04-29 (×6): 20 meq via ORAL
  Filled 2015-04-27 (×6): qty 1

## 2015-04-27 MED ORDER — DOCUSATE SODIUM 100 MG PO CAPS
200.0000 mg | ORAL_CAPSULE | Freq: Every day | ORAL | Status: DC
  Administered 2015-04-27 – 2015-04-30 (×4): 200 mg via ORAL
  Filled 2015-04-27 (×4): qty 2

## 2015-04-27 MED ORDER — BISACODYL 5 MG PO TBEC: 10.0000 mg | DELAYED_RELEASE_TABLET | Freq: Every day | ORAL | Status: DC | PRN

## 2015-04-27 MED ORDER — FUROSEMIDE 40 MG PO TABS
40.0000 mg | ORAL_TABLET | Freq: Every day | ORAL | Status: AC
  Administered 2015-04-27 – 2015-04-29 (×3): 40 mg via ORAL
  Filled 2015-04-27 (×3): qty 1

## 2015-04-27 MED ORDER — SODIUM CHLORIDE 0.9 % IJ SOLN: 3.0000 mL | INTRAMUSCULAR | Status: DC | PRN

## 2015-04-27 MED ORDER — SODIUM CHLORIDE 0.9 % IJ SOLN
3.0000 mL | Freq: Two times a day (BID) | INTRAMUSCULAR | Status: DC
  Administered 2015-04-27 – 2015-04-29 (×6): 3 mL via INTRAVENOUS

## 2015-04-27 MED ORDER — ASPIRIN EC 325 MG PO TBEC
325.0000 mg | DELAYED_RELEASE_TABLET | Freq: Every day | ORAL | Status: DC
  Administered 2015-04-27 – 2015-04-30 (×4): 325 mg via ORAL
  Filled 2015-04-27 (×3): qty 1

## 2015-04-27 MED ORDER — TRAMADOL HCL 50 MG PO TABS
50.0000 mg | ORAL_TABLET | ORAL | Status: DC | PRN
  Administered 2015-04-28 – 2015-04-30 (×3): 100 mg via ORAL
  Filled 2015-04-27 (×4): qty 2

## 2015-04-27 MED ORDER — BISACODYL 10 MG RE SUPP: 10.0000 mg | Freq: Every day | RECTAL | Status: DC | PRN

## 2015-04-27 MED ORDER — SODIUM CHLORIDE 0.9 % IV SOLN: 250.0000 mL | INTRAVENOUS | Status: DC | PRN

## 2015-04-27 MED ORDER — ONDANSETRON HCL 4 MG/2ML IJ SOLN
4.0000 mg | Freq: Four times a day (QID) | INTRAMUSCULAR | Status: DC | PRN
Start: 2015-04-27 — End: 2015-04-30

## 2015-04-27 MED ORDER — FAMOTIDINE 20 MG PO TABS
20.0000 mg | ORAL_TABLET | Freq: Two times a day (BID) | ORAL | Status: DC
  Administered 2015-04-27 – 2015-04-30 (×7): 20 mg via ORAL
  Filled 2015-04-27 (×7): qty 1

## 2015-04-27 MED ORDER — INSULIN ASPART 100 UNIT/ML ~~LOC~~ SOLN
0.0000 [IU] | Freq: Three times a day (TID) | SUBCUTANEOUS | Status: DC
  Administered 2015-04-27 – 2015-04-28 (×3): 2 [IU] via SUBCUTANEOUS
  Administered 2015-04-28: 12 [IU] via SUBCUTANEOUS
  Administered 2015-04-29: 4 [IU] via SUBCUTANEOUS
  Administered 2015-04-29: 8 [IU] via SUBCUTANEOUS

## 2015-04-27 NOTE — Progress Notes (Signed)
2 Days Post-Op Procedure(s) (LRB): CORONARY ARTERY BYPASS GRAFTING (CABG) x four,  using left internal mammary artery and right leg greater saphenous vein harvested endoscopically (N/A) TRANSESOPHAGEAL ECHOCARDIOGRAM (TEE) (N/A) Subjective:  No complaints.  Had hypoglycemic episode early this am.   Objective: Vital signs in last 24 hours: Temp:  [98.3 F (36.8 C)-98.6 F (37 C)] 98.6 F (37 C) (11/02 0745) Pulse Rate:  [69-79] 78 (11/02 0800) Cardiac Rhythm:  [-] Normal sinus rhythm (11/02 0800) Resp:  [13-21] 19 (11/02 0800) BP: (86-160)/(40-104) 109/56 mmHg (11/02 0800) SpO2:  [93 %-100 %] 95 % (11/02 0800) Arterial Line BP: (145-188)/(47-80) 145/47 mmHg (11/01 1400) Weight:  [102.6 kg (226 lb 3.1 oz)] 102.6 kg (226 lb 3.1 oz) (11/02 0645)  Hemodynamic parameters for last 24 hours: PAP: (22-23)/(12-15) 22/15 mmHg  Intake/Output from previous day: 11/01 0701 - 11/02 0700 In: 515.5 [I.V.:415.5; IV Piggyback:100] Out: 2105 [Urine:2085; Chest Tube:20] Intake/Output this shift: Total I/O In: 10 [I.V.:10] Out: 50 [Urine:50]  General appearance: alert and cooperative Neurologic: intact Heart: regular rate and rhythm, S1, S2 normal, no murmur, click, rub or gallop Lungs: diminished breath sounds bibasilar and bilaterally Extremities: extremities normal, atraumatic, no cyanosis or edema Wound: dressing dry  Lab Results:  Recent Labs  04/26/15 1559 04/27/15 0425  WBC 9.6 9.4  HGB 10.5* 9.9*  HCT 31.8* 30.1*  PLT 134* 132*   BMET:  Recent Labs  04/26/15 0256 04/26/15 1548 04/26/15 1559 04/27/15 0425  NA 135 137  --  138  K 3.8 4.3  --  3.8  CL 106 104  --  106  CO2 21*  --   --  25  GLUCOSE 129* 192*  --  49*  BUN 17 16  --  15  CREATININE 1.04* 0.90 1.01* 1.00  CALCIUM 8.0*  --   --  8.3*    PT/INR:  Recent Labs  04/25/15 1500  LABPROT 16.2*  INR 1.28   ABG    Component Value Date/Time   PHART 7.396 04/26/2015 0414   HCO3 22.5 04/26/2015 0414   TCO2 20 04/26/2015 1548   ACIDBASEDEF 2.0 04/26/2015 0414   O2SAT 99.0 04/26/2015 0414   CBG (last 3)   Recent Labs  04/27/15 0404 04/27/15 0432 04/27/15 0533  GLUCAP 43* 65 114*   CXR: bibasilar atelectasis.  Assessment/Plan: S/P Procedure(s) (LRB): CORONARY ARTERY BYPASS GRAFTING (CABG) x four,  using left internal mammary artery and right leg greater saphenous vein harvested endoscopically (N/A) TRANSESOPHAGEAL ECHOCARDIOGRAM (TEE) (N/A)  She remains hemodynamically stable in sinus rhythm. Stage 3 CKD: stable with normal creat postop.  Mobilize Diuresis Diabetes control: hypoglycemic episode overnight. She did get 10 units of Levemir last night in addition to SSI. Will continue daily Levemir in the morning and SSI ac. Plan for transfer to 2W: see transfer orders   LOS: 7 days    Carol [name] 04/27/2015

## 2015-04-27 NOTE — Progress Notes (Signed)
CARDIAC REHAB PHASE I   Pt has ambulated x2 today, last time being 30 min ago. Tired and requested to go to bed. Assisted pt to bed (rocking with gait belt to stand, failed first attempt). Requested pain meds after she apparently pushed with arms in BR earlier. Will f/u in am. [id number]  Carol [name] [place] CES, ACSM 04/27/2015 1:40 PM

## 2015-04-27 NOTE — Progress Notes (Signed)
Patient transferred to 2W08, patient ambulated with wheelchair from 2S, patient tolerated walk well, patient belongings at the bedside, SCDs at bedside, call bell within reach, receiving RN at bedside, no questions from patient or receiving RN at this time.  Rowe Pavy, RN

## 2015-04-27 NOTE — Progress Notes (Signed)
Results for LAQUANDRIA, GANNAWAY (MRN [id number]) as of 04/27/2015 09:53  Ref. Range 04/26/2015 23:29 04/27/2015 04:04 04/27/2015 04:32 04/27/2015 05:33 04/27/2015 07:44  Glucose-Capillary Latest Ref Range: 65-99 mg/dL 160 (H) 43 (LL) 65 114 (H) 166 (H)  Recommend decreasing Levemir to 15 units daily. If CBGs continue to be less than 100 mg/dl, consider decreasing Novolog correction scale to SENSITIVE TID & HS. Will continue to monitor blood sugars while in hospital. Harvel Ricks RN BSN CDE

## 2015-04-27 NOTE — Care Management Note (Signed)
Case Management Note  Patient Details  Name: Carol [name] MRN: [id number] Date of Birth: [birth year]  Subjective/Objective:      Patient states plans to go to rehab prior to going home.  Daughter already working on arrangements and where to go.  I see SW has also already talked to patient prior to surgery.  PT consult placed.               Action/Plan:   Expected Discharge Date:                  Expected Discharge Plan:  Skilled Nursing Facility  In-House Referral:  Clinical Social Work  Discharge planning Services     Post Acute Care Choice:    Choice offered to:     DME Arranged:    DME Agency:     HH Arranged:    [place] Agency:     Status of Service:  In process, will continue to follow  Medicare Important Message Given:  Yes-second notification given Date Medicare IM Given:    Medicare IM give by:    Date Additional Medicare IM Given:    Additional Medicare Important Message give by:     If discussed at [place] of Stay Meetings, dates discussed:    Additional Comments:  Vergie Living, RN 04/27/2015, 8:50 AM

## 2015-04-27 NOTE — Evaluation (Signed)
Physical Therapy Evaluation Patient Details Name: Carol [name] MRN: [id number] DOB: 1935/10/27 Today's Date: 04/27/2015   History of Present Illness  79 y.o. female with a hx of CAD, prior ischemic cardiomyopathy with improvement in EF post PCI, diabetes, HTN, HL, asthma and hypothyroidism admitted from the office for ischemic eval given chest pain syndrome similar to her previous angina. Cardiac enzymes are negative x 3. Pt s/p CABG x 4.    Clinical Impression  Pt presents with above.  Tolerated all activity well, however has most difficulty getting in/out of bed with use of heart pillow.  Pt able to ambulate x 150' at min/guard to S level during session.  Will continue to see acutely to address deficits.  Feel pt is most appropriate for ST SNF placement due to living at home alone.      Follow Up Recommendations SNF;Supervision/Assistance - 24 hour    Equipment Recommendations  Other (comment) (would likely do well with rollator)    Recommendations for Other Services       Precautions / Restrictions Precautions Precautions: Sternal Restrictions Weight Bearing Restrictions: Yes Other Position/Activity Restrictions: sternal precautions.      Mobility  Bed Mobility Overal bed mobility: Needs Assistance Bed Mobility: Rolling;Sidelying to Sit;Sit to Sidelying Rolling: Min assist Sidelying to sit: Min assist;Mod assist     Sit to sidelying: Mod assist General bed mobility comments: Educated on performing log rolling and using counter force of lowering LEs out of bed while elevating trunk, however pt still requires mod A to complete task due to increased pain.  She did very well using heart pillow to prevent pushing through UEs.   Transfers Overall transfer level: Needs assistance Equipment used: Rolling walker (2 wheeled) Transfers: Sit to/from Stand Sit to Stand: Supervision         General transfer comment: Min cues for use of heart  pillow  Ambulation/Gait Ambulation/Gait assistance: Min guard;Supervision Ambulation Distance (Feet): 150 Feet Assistive device: Rolling walker (2 wheeled) Gait Pattern/deviations: Step-through pattern;Decreased stride length     General Gait Details: Min cues for posture and safety with RW.  Pt did well not pushing through RW heavily.   Stairs            Wheelchair Mobility    Modified Rankin (Stroke Patients Only)       Balance Overall balance assessment: Needs assistance   Sitting balance-Leahy Scale: Normal     Standing balance support: During functional activity;Bilateral upper extremity supported Standing balance-Leahy Scale: Fair                               Pertinent Vitals/Pain Pain Assessment: 0-10 Pain Score: 5  Pain Location: chest Pain Descriptors / Indicators: Sore Pain Intervention(s): Monitored during session;Repositioned    Home Living Family/patient expects to be discharged to:: Skilled nursing facility Living Arrangements: Alone                    Prior Function Level of Independence: Independent               Hand Dominance        Extremity/Trunk Assessment               Lower Extremity Assessment: Generalized weakness      Cervical / Trunk Assessment: Normal  Communication   Communication: HOH  Cognition Arousal/Alertness: Awake/alert Behavior During Therapy: WFL for tasks assessed/performed Overall Cognitive Status: Within Functional Limits  for tasks assessed                      General Comments      Exercises        Assessment/Plan    PT Assessment    PT Diagnosis Generalized weakness;Acute pain   PT Problem List    PT Treatment Interventions     PT Goals (Current goals can be found in the Care Plan section) Acute Rehab PT Goals Patient Stated Goal: to return home PT Goal Formulation: With patient Time For Goal Achievement: 05/04/15 Potential to Achieve Goals:  Good    Frequency     Barriers to discharge        Co-evaluation               End of Session   Activity Tolerance: Patient tolerated treatment well Patient left: in bed;with call bell/phone within reach Nurse Communication: Mobility status         Time: [id number] PT Time Calculation (min) (ACUTE ONLY): 27 min   Charges:   PT Evaluation $Initial PT Evaluation Tier I: 1 Procedure PT Treatments $Gait Training: 8-22 mins   PT G CodesDenice Bors 04/27/2015, 5:12 PM

## 2015-04-27 NOTE — Progress Notes (Signed)
Patient received to 2 [facility] -08. Alert and oriented. No questions or concerns at this time.

## 2015-04-27 NOTE — Progress Notes (Signed)
Hypoglycemic Event  CBG: 43  Treatment: 15 GM carbohydrate snack  Symptoms: Sweaty, Shaky and Vision changes  Follow-up CBG: Time: 0432 CBG Result: 65  Possible Reasons for Event: Medication regimen: extra dose of levemir  Follow-up CBG: Time: 0533 CBG Result: 114  Comments/MD notified: Treated pt per protocol.  Will notify MD upon rounds.  Will continue to monitor closely.    Missy Sabins

## 2015-04-27 NOTE — Anesthesia Postprocedure Evaluation (Signed)
  Anesthesia Post-op Note  Patient: Carol [name]  Procedure(s) Performed: Procedure(s): CORONARY ARTERY BYPASS GRAFTING (CABG) x four,  using left internal mammary artery and right leg greater saphenous vein harvested endoscopically (N/A) TRANSESOPHAGEAL ECHOCARDIOGRAM (TEE) (N/A)  Patient Location: ICU  Anesthesia Type:General  Level of Consciousness: sedated  Airway and Oxygen Therapy: Patient remains intubated per anesthesia plan  Post-op Pain: none  Post-op Assessment: Post-op Vital signs reviewed, Patient's Cardiovascular Status Stable, Respiratory Function Stable, Patent Airway, No signs of Nausea or vomiting and Pain level controlled              Post-op Vital Signs: Reviewed and stable  Last Vitals:  Filed Vitals:   04/27/15 1251  BP: 106/53  Pulse: 81  Temp: 36.7 C  Resp: 18    Complications: No apparent anesthesia complications

## 2015-04-28 LAB — TYPE AND SCREEN
ABO/RH(D): O NEG
Antibody Screen: NEGATIVE
UNIT DIVISION: 0
UNIT DIVISION: 0
Unit division: 0
Unit division: 0

## 2015-04-28 LAB — CBC
HCT: 30.1 % — ABNORMAL LOW (ref 36.0–46.0)
Hemoglobin: 9.7 g/dL — ABNORMAL LOW (ref 12.0–15.0)
MCH: 28.9 pg (ref 26.0–34.0)
MCHC: 32.2 g/dL (ref 30.0–36.0)
MCV: 89.6 fL (ref 78.0–100.0)
PLATELETS: 136 10*3/uL — AB (ref 150–400)
RBC: 3.36 MIL/uL — ABNORMAL LOW (ref 3.87–5.11)
RDW: 15.6 % — AB (ref 11.5–15.5)
WBC: 8.3 10*3/uL (ref 4.0–10.5)

## 2015-04-28 LAB — GLUCOSE, CAPILLARY
GLUCOSE-CAPILLARY: 69 mg/dL (ref 65–99)
GLUCOSE-CAPILLARY: 104 mg/dL — AB (ref 65–99)
Glucose-Capillary: 252 mg/dL — ABNORMAL HIGH (ref 65–99)
Glucose-Capillary: 122 mg/dL — ABNORMAL HIGH (ref 65–99)

## 2015-04-28 MED ORDER — LISINOPRIL 2.5 MG PO TABS
2.5000 mg | ORAL_TABLET | Freq: Every day | ORAL | Status: DC
  Administered 2015-04-28 – 2015-04-30 (×3): 2.5 mg via ORAL
  Filled 2015-04-28 (×3): qty 1

## 2015-04-28 NOTE — Progress Notes (Signed)
CARDIAC REHAB PHASE I   PRE:  Rate/Rhythm: 85 SR  BP:  Supine: 92/65  Sitting:   Standing:    SaO2: 96%RA  MODE:  Ambulation: 400 ft   POST:  Rate/Rhythm: 95 SR  BP:  Supine: 145/52  Sitting:   Standing:    SaO2: 98%RA 1330-1400 Pt walked 400 ft on RA with rolling walker and minimal asst. To bathroom prior to walk. Pt c/o SOB after walk when sitting on side of bed and DOE noted. Sats good. To bed at pt's request. Encouraged pt to walk at least once more today.    Graylon Good, RN BSN  04/28/2015 1:59 PM

## 2015-04-28 NOTE — Progress Notes (Signed)
Carol [name] is reviewing clinicals- they believe they can accept pt when medically stable  CSW will continue to follow to assist in placement when pt is medically stable  [name], [place] Social Worker [phone]

## 2015-04-28 NOTE — Progress Notes (Addendum)
3 Days Post-Op Procedure(s) (LRB): CORONARY ARTERY BYPASS GRAFTING (CABG) x four,  using left internal mammary artery and right leg greater saphenous vein harvested endoscopically (N/A) TRANSESOPHAGEAL ECHOCARDIOGRAM (TEE) (N/A) Subjective: Some soreness, some SOB with exertion  Objective: Vital signs in last 24 hours: Temp:  [98.1 F (36.7 C)-99.3 F (37.4 C)] 99.1 F (37.3 C) (11/03 0449) Pulse Rate:  [76-81] 79 (11/03 0449) Cardiac Rhythm:  [-] Normal sinus rhythm (11/02 1940) Resp:  [16-19] 19 (11/03 0449) BP: (106-132)/(45-56) 126/50 mmHg (11/03 0449) SpO2:  [95 %-98 %] 96 % (11/03 0449) Weight:  [221 lb 12.5 oz (100.6 kg)] 221 lb 12.5 oz (100.6 kg) (11/03 0449)  Hemodynamic parameters for last 24 hours:    Intake/Output from previous day: 11/02 0701 - 11/03 0700 In: 353 [P.O.:340; I.V.:13] Out: 250 [Urine:250] Intake/Output this shift:    General appearance: alert, cooperative and no distress Heart: regular rate and rhythm Lungs: mmildly dim in bases Abdomen: benign Extremities: some edema Wound: chest dressing CDI, evh incis healing well  Lab Results:  Recent Labs  04/27/15 0425 04/28/15 0318  WBC 9.4 8.3  HGB 9.9* 9.7*  HCT 30.1* 30.1*  PLT 132* 136*   BMET:  Recent Labs  04/26/15 0256 04/26/15 1548 04/26/15 1559 04/27/15 0425  NA 135 137  --  138  K 3.8 4.3  --  3.8  CL 106 104  --  106  CO2 21*  --   --  25  GLUCOSE 129* 192*  --  49*  BUN 17 16  --  15  CREATININE 1.04* 0.90 1.01* 1.00  CALCIUM 8.0*  --   --  8.3*    PT/INR:  Recent Labs  04/25/15 1500  LABPROT 16.2*  INR 1.28   ABG    Component Value Date/Time   PHART 7.396 04/26/2015 0414   HCO3 22.5 04/26/2015 0414   TCO2 20 04/26/2015 1548   ACIDBASEDEF 2.0 04/26/2015 0414   O2SAT 99.0 04/26/2015 0414   CBG (last 3)   Recent Labs  04/27/15 1635 04/27/15 2119 04/28/15 0620  GLUCAP 139* 111* 69    Meds Scheduled Meds: . allopurinol  300 mg Oral Daily  . aspirin  EC  325 mg Oral Daily  . carvedilol  3.125 mg Oral BID WC  . docusate sodium  200 mg Oral Daily  . famotidine  20 mg Oral BID  . furosemide  40 mg Oral Daily  . insulin aspart  0-24 Units Subcutaneous TID AC & HS  . insulin detemir  20 Units Subcutaneous Daily  . levothyroxine  50 mcg Oral QAC breakfast  . potassium chloride  20 mEq Oral BID  . sodium chloride  3 mL Intravenous Q12H   Continuous Infusions:  PRN Meds:.sodium chloride, acetaminophen, bisacodyl **OR** bisacodyl, ondansetron **OR** ondansetron (ZOFRAN) IV, oxyCODONE, sodium chloride, traMADol  Xrays Dg Chest Port 1 View  04/27/2015  CLINICAL DATA:  Recent coronary artery bypass grafting.  Chest pain. EXAM: PORTABLE CHEST 1 VIEW COMPARISON:  April 26, 2015 FINDINGS: Swan-Ganz catheter is been removed. Cordis tip is in the superior cava. Chest tubes and mediastinal drains have been removed. No pneumothorax. There is bibasilar atelectasis with small right pleural effusion. Heart is upper normal in size with pulmonary vascularity within normal limits. No adenopathy appreciable. IMPRESSION: Central catheter tip in superior vena cava. No pneumothorax. Small right effusion with bibasilar atelectatic change. No change in cardiac silhouette. Electronically Signed   By: Lowella Grip III M.D.   On: 04/27/2015  07:19    Assessment/Plan: S/P Procedure(s) (LRB): CORONARY ARTERY BYPASS GRAFTING (CABG) x four,  using left internal mammary artery and right leg greater saphenous vein harvested endoscopically (N/A) TRANSESOPHAGEAL ECHOCARDIOGRAM (TEE) (N/A)  1 excellent progress 2 cont diuresis for volume overload 3 she is intol to statins 4 hemodyn stable, shoould be able to tol a little ace inhib 5 sinus rhythm 6 SW is assisting with bed search, wants to go to [facility] short term as she is a widow and children work so cant stay with her 7 CBC stable 8 sugars scontrolled 9 push rehab as able  LOS: 8 days    GOLD,WAYNE  E 04/28/2015   Chart reviewed, patient examined, agree with above. She is progressing well for POD 3. Still needs some diuresis and bowels have no moved yet. I suspect she won't be able to go to SNF until Monday.

## 2015-04-28 NOTE — Progress Notes (Signed)
UR Completed. Birdell Frasier, RN, BSN.  [phone] 

## 2015-04-29 LAB — GLUCOSE, CAPILLARY
GLUCOSE-CAPILLARY: 124 mg/dL — AB (ref 65–99)
GLUCOSE-CAPILLARY: 228 mg/dL — AB (ref 65–99)
Glucose-Capillary: 93 mg/dL (ref 65–99)
Glucose-Capillary: 212 mg/dL — ABNORMAL HIGH (ref 65–99)

## 2015-04-29 NOTE — Discharge Summary (Signed)
[address]       ,[place] [address]             [phone]              Discharge Summary  Name: Carol [name] DOB: [birth year] 79 y.o. MRN: [id number]   Admission Date: 04/19/2015 Discharge Date: 04/30/2015  Admitting Diagnosis: Unstable angina Coronary artery disease   Discharge Diagnosis:  Left main and severe multivessel coronary artery disease Unstable angina Expected postoperative blood loss anemia  Past Medical History  Diagnosis Date  . Hypertension   . Asthma   . Hyperlipidemia     a. H/o intolerance to Zocor, Lipitor. Had muscle aches on higher dose Crestor.  . Gout, unspecified   . CAD S/P percutaneous coronary angioplasty 01/22/2014    A. (12/2013): POBA - OM2 99% (very tortuous segment) - reduced ~ 50% & TIMI 3 flow, 80-90% stenosis in mid PDA, Irregularities <50% in mRCA, pLAD and prox LCx; b. 02/17/14: Moderate sized fixed defect in Lateral wall --> cath with OM2 restenosis & otherwise stable --> Xience Alpine DES x 2 (2.25 mm x 12 & 8 mm). c. NSTEMI 02/2014 s/p PTCA/balloon angioplasty only to small caliber PDA.  Marland Kitchen Chronic systolic heart failure ([place])     a. ICM b. ECHO (01/2014): EF 25-30%, grade I DD, trivial MR. c. EF by Myoview 02/17/14 = ~53%. d. EF by cath 02/24/14 - improved to 50-55%.  . Environmental allergies   . Type II diabetes mellitus ([place])   . GERD (gastroesophageal reflux disease)   . TIA (transient ischemic attack) 1990's    a. TIA vs stroke 1990's "mild" - sounds like a TIA as she was told she had stroke symptoms but negative scans. Denies residual effects.  . Contrast media allergy   . QT prolongation     a. Noted on EKG 02/2014.  Marland Kitchen Anemia     a. Noted on labs 02/2014 possibly procedurally related.  Marland Kitchen LBBB (left bundle branch block)     a. Transient during 02/2014 admission.  . CKD (chronic kidney disease), stage III   . Sinus bradycardia     a. HR 40s-50s during 02/2014 admission, limiting BB dose.  . NSTEMI  (non-ST elevated myocardial infarction) ([place]) 2014; 12/2013, 01/2014  . Hypothyroidism     Procedures: CORONARY ARTERY BYPASS GRAFTING x 4 - 04/25/2015  Left internal mammary artery to left anterior descending  Sequential saphenous vein graft to obtuse marginals 2-3  Saphenous vein graft to distal right coronary artery  Endoscopic greater saphenous vein harvest right leg    HPI:  The patient is a 79 y.o. female with hypertension, hyperlipidemia, poorly controlled DM and coronary artery disease, s/p PTCA of the OM2 on 01/22/2014 after NSTEMI. She continued to have chest pain after that and a stress myoview was performed which showed a perfusion defect in the lateral wall. She underwent repeat catheterization on 02/17/2014 showing severe restenosis of the OM2 and was successfully treated with PTCA/DES x 2. She had recurrent chest pain and a slight elevation of her troponin and underwent PTCA of the small PDA that had a 99% stenosis. Stent was not placed due to small vessel size. She says that she has had intermittent exertional chest pain since then, but has cut back her activity to minimize it. She now presents with progressive angina with chest pain occuring with minimal exertion like walking around in her house, which is associated with fatigue. Cardiac  cath was performed on 04/20/2015 and showed some abnormality of the mid to distal LM, but it was difficult to tell if it was stenosis or contrast streaming. The previous OM stents were patent. The RCA has a new 70% mid vessel stenosis. LVEF 50%. The patient was admitted following catheterization for further evaluation.     Hospital Course:  The patient was admitted to [facility] on 04/19/2015. She had a cardiac CT to examine the left main further which showed a large area of calcium at the ostium and proximal LM with an area of soft plaque with a lucent core in the mid to distal LM with 50-75% stenosis. The ostium of the LAD is heavily calcified. A  cardiac surgery consultation was requested and Dr. Cyndia Bent saw the patient and reviewed her studies. It was felt that she would benefit from surgical revascularization at this time.  All risks, benefits and alternatives of surgery were explained in detail, and the patient agreed to proceed. Because she had been on Plavix previously, surgery was delayed to allow a Plavix washout.  The patient was taken to the operating room on 04/25/2015 and underwent the above procedure.    The postoperative course has generally been uneventful.  She has been volume overloaded and was started on Lasix, to which she has responded well. She has remained in sinus rhythm and blood pressures have been stable. Incisions are healing well.  Blood sugars have been controlled on Levemir. She has been ambulating in the hall with physical therapy and is progressing well with mobility.  She is tolerating a regular diet. It is felt that she would benefit from short term SNF placement post-discharge, and social work is assisting with a bed search.  Presently, the patient is medically stable for transfer to [facility] today 04/30/2015.  Recent vital signs:  Filed Vitals:   04/30/15 0748  BP:   Pulse: 78  Temp:   Resp:     Recent laboratory studies:  CBC:  Recent Labs  04/28/15 0318  WBC 8.3  HGB 9.7*  HCT 30.1*  PLT 136*   BMET: No results for input(s): NA, K, CL, CO2, GLUCOSE, BUN, CREATININE, CALCIUM in the last 72 hours.  PT/INR: No results for input(s): LABPROT, INR in the last 72 hours.   Discharge Medications:     Medication List    STOP taking these medications        nitroGLYCERIN 0.4 MG SL tablet  Commonly known as:  NITROSTAT     oxyCODONE-acetaminophen 5-325 MG tablet  Commonly known as:  PERCOCET/ROXICET      TAKE these medications        allopurinol 300 MG tablet  Commonly known as:  ZYLOPRIM  Take 300 mg by mouth every morning.     aspirin 81 MG EC tablet  Take 1 tablet (81 mg total) by  mouth daily.     Biotin 5000 MCG Caps  Take 1 capsule by mouth every morning.     carvedilol 3.125 MG tablet  Commonly known as:  COREG  Take 1 tablet (3.125 mg total) by mouth 2 (two) times daily with a meal.     clopidogrel 75 MG tablet  Commonly known as:  PLAVIX  TAKE 1 TABLET BY MOUTH ONCE DAILY.     colchicine 0.6 MG tablet  Take 0.6 mg by mouth daily as needed (gout flare up.).     famotidine 10 MG tablet  Commonly known as:  PEPCID AC  Take 1 tablet (  10 mg total) by mouth 2 (two) times daily.     furosemide 40 MG tablet  Commonly known as:  LASIX  Take 40 mg by mouth daily.     insulin NPH-regular Human (70-30) 100 UNIT/ML injection  Commonly known as:  HUMULIN 70/30  Inject 32 Units into the skin daily with breakfast. And syringes 1/day     isosorbide mononitrate 30 MG 24 hr tablet  Commonly known as:  IMDUR  Take 1 tablet (30 mg total) by mouth 2 (two) times daily.     levothyroxine 50 MCG tablet  Commonly known as:  SYNTHROID, LEVOTHROID  Take 50 mcg by mouth every morning.     lisinopril 2.5 MG tablet  Commonly known as:  PRINIVIL,ZESTRIL  Take 1 tablet (2.5 mg total) by mouth daily.     oxyCODONE 5 MG immediate release tablet  Commonly known as:  Oxy IR/ROXICODONE  Take 1-2 tablets (5-10 mg total) by mouth every 3 (three) hours as needed for severe pain.     pantoprazole 40 MG tablet  Commonly known as:  PROTONIX  TAKE 1 TABLET BY MOUTH ONCE DAILY.     PROAIR HFA 108 (90 BASE) MCG/ACT inhaler  Generic drug:  albuterol  Inhale 1-2 puffs into the lungs every 4 (four) hours as needed for wheezing or shortness of breath. For breathing     traMADol 50 MG tablet  Commonly known as:  ULTRAM  Take 1 tablet (50 mg total) by mouth daily as needed for moderate pain.         Discharge Instructions:   1. Please obtain vital signs at least one time daily 2. Please weigh the patient daily. If he or she continues to gain weight or develops lower extremity  edema, contact the office at [phone]. 3. Ambulate patient at least three times daily and please use sternal precautions. 4. The patient is to refrain from driving, heavy lifting or strenuous activity.   5/ May shower daily and clean incisions with soap and water.   6. May resume regular diet.    Follow Up:   Follow-up Information    Follow up with [facility] .   Specialty:  [place] information:   [place] [phone]      Follow up with Richardson Dopp, PA-C On 05/16/2015.   Specialties:  Physician Assistant, Radiology, Interventional Cardiology   Why:  Richardson Dopp, PA-C 11/21 @ 11:50am ([address] [facility])    Contact information:   [id number] [place] [address] [phone]       Follow up with Gaye Pollack, MD On 06/01/2015.   Specialty:  Cardiothoracic Surgery   Why:  Have a chest x-ray at [place] at 9:00, then see MD at 10:00   Contact information:   [address] [place] [address] [phone]       The patient has been discharged on:  1.Beta Blocker: Yes [ x ]  No [ ]   If No, reason:    2.Ace Inhibitor/ARB: Yes [ x ]  No [  ]  If No, reason:    3.Statin: Yes [  ]  No [ x ]  If No, reason: Reported intolerance   4.[place]: Yes [ x ]  No [ ]   If No, reason:   Savvy Peeters 04/30/2015, 10:04 AM

## 2015-04-29 NOTE — Progress Notes (Signed)
Physical Therapy Treatment Patient Details Name: Carol [name] MRN: [id number] DOB: 09/23/1935 Today's Date: 04/29/2015    History of Present Illness 79 y.o. female with a hx of CAD, prior ischemic cardiomyopathy with improvement in EF post PCI, diabetes, HTN, HL, asthma and hypothyroidism admitted from the office for ischemic eval given chest pain syndrome similar to her previous angina. Cardiac enzymes are negative x 3. Pt s/p CABG x 4.      PT Comments    Progressing well.  Steady gait, minimal dyspnea, VSS.  Emphasis on gait,education and activity tolerance.   Follow Up Recommendations  SNF;Supervision/Assistance - 24 hour     Equipment Recommendations       Recommendations for Other Services       Precautions / Restrictions Precautions Precautions: Sternal    Mobility  Bed Mobility Overal bed mobility: Needs Assistance Bed Mobility: Supine to Sit     Supine to sit: Min assist     General bed mobility comments: reinforced log roll to side best technique after pt tried straight  supine to sit, needing min assist  Transfers Overall transfer level: Needs assistance Equipment used: Rolling walker (2 wheeled) Transfers: Sit to/from Stand Sit to Stand: Supervision            Ambulation/Gait Ambulation/Gait assistance: Supervision Ambulation Distance (Feet): 1000 Feet (and more with 2 standing rest breaks) Assistive device: Rolling walker (2 wheeled) Gait Pattern/deviations: Step-through pattern Gait velocity: functional age appropriate speed. Gait velocity interpretation: at or above normal speed for age/gender General Gait Details: steady gait, good use of RW and postural cues   Stairs            Wheelchair Mobility    Modified Rankin (Stroke Patients Only)       Balance     Sitting balance-Leahy Scale: Normal       Standing balance-Leahy Scale: Good                      Cognition Arousal/Alertness: Awake/alert Behavior  During Therapy: WFL for tasks assessed/performed Overall Cognitive Status: Within Functional Limits for tasks assessed                      Exercises      General Comments General comments (skin integrity, edema, etc.): sats during gait 98% on RA, EHR 90 bpm      Pertinent Vitals/Pain Pain Assessment: No/denies pain    Home Living                      Prior Function            PT Goals (current goals can now be found in the care plan section) Acute Rehab PT Goals Patient Stated Goal: to return home PT Goal Formulation: With patient Time For Goal Achievement: 05/04/15 Potential to Achieve Goals: Good Progress towards PT goals: Progressing toward goals    Frequency  Min 2X/week    PT Plan Current plan remains appropriate;Frequency needs to be updated    Co-evaluation             End of Session   Activity Tolerance: Patient tolerated treatment well Patient left: in bed;Other (comment);with call bell/phone within reach (sitting EOB)     Time: [id number] PT Time Calculation (min) (ACUTE ONLY): 24 min  Charges:  $Gait Training: 23-37 mins  G Codes:      Lavra Imler, Tessie Fass 04/29/2015, 4:20 PM 04/29/2015  Donnella Sham, [place] [phone]  (pager)

## 2015-04-29 NOTE — Progress Notes (Signed)
Pt's pacing wires removed at 1315 per protocol. Pt tolerated procedure well without any complications. Vital signs remained stable.

## 2015-04-29 NOTE — Progress Notes (Signed)
CARDIAC REHAB PHASE I   PRE:  Rate/Rhythm: 75 SR  BP:  Supine:   Sitting: 139/47  Standing:    SaO2: 97%RA  MODE:  Ambulation: 450 ft   POST:  Rate/Rhythm: 96 SR  BP:  Supine: 152/49  Sitting:   Standing:    SaO2: 94%RA 1020-1108 Pt walked 450 ft on RA with rolling walker with steady gait. Pt demonstrated how she can get up and down without using arms. To bed after walk for pacing wire removal. Education completed with pt who voiced understanding. Encouraged IS. Wrote down how to view post op video later. Discussed CRP 2 but pt does not want referral as she stated she did before and does not want to repeat.   Graylon Good, RN BSN  04/29/2015 11:05 AM

## 2015-04-29 NOTE — Progress Notes (Signed)
[name] can accept patient when medically stable  CSW will continue to follow  [name], [place] Social Worker [phone]

## 2015-04-29 NOTE — Progress Notes (Signed)
4 Days Post-Op Procedure(s) (LRB): CORONARY ARTERY BYPASS GRAFTING (CABG) x four,  using left internal mammary artery and right leg greater saphenous vein harvested endoscopically (N/A) TRANSESOPHAGEAL ECHOCARDIOGRAM (TEE) (N/A) Subjective: Feeling pretty well, some incis discomfort, no BM yet  Objective: Vital signs in last 24 hours: Temp:  [98.4 F (36.9 C)-99.6 F (37.6 C)] 98.4 F (36.9 C) (11/04 0323) Pulse Rate:  [80-81] 80 (11/04 0323) Cardiac Rhythm:  [-] Heart block (11/03 1921) Resp:  [18-20] 18 (11/04 0323) BP: (127-149)/(50-56) 149/56 mmHg (11/04 0323) SpO2:  [96 %-98 %] 96 % (11/04 0323) Weight:  [218 lb 11.1 oz (99.2 kg)] 218 lb 11.1 oz (99.2 kg) (11/04 0323)  Hemodynamic parameters for last 24 hours:    Intake/Output from previous day: 11/03 0701 - 11/04 0700 In: 240 [P.O.:240] Out: 1450 [Urine:1450] Intake/Output this shift:    General appearance: alert, cooperative and no distress Heart: regular rate and rhythm Lungs: mildly dim in bases Abdomen: soft, non-tender Extremities: no edema Wound: incis healing well  Lab Results:  Recent Labs  04/27/15 0425 04/28/15 0318  WBC 9.4 8.3  HGB 9.9* 9.7*  HCT 30.1* 30.1*  PLT 132* 136*   BMET:  Recent Labs  04/26/15 1548 04/26/15 1559 04/27/15 0425  NA 137  --  138  K 4.3  --  3.8  CL 104  --  106  CO2  --   --  25  GLUCOSE 192*  --  49*  BUN 16  --  15  CREATININE 0.90 1.01* 1.00  CALCIUM  --   --  8.3*    PT/INR: No results for input(s): LABPROT, INR in the last 72 hours. ABG    Component Value Date/Time   PHART 7.396 04/26/2015 0414   HCO3 22.5 04/26/2015 0414   TCO2 20 04/26/2015 1548   ACIDBASEDEF 2.0 04/26/2015 0414   O2SAT 99.0 04/26/2015 0414   CBG (last 3)   Recent Labs  04/28/15 1612 04/28/15 2131 04/29/15 0620  GLUCAP 122* 104* 93    Meds Scheduled Meds: . allopurinol  300 mg Oral Daily  . aspirin EC  325 mg Oral Daily  . carvedilol  3.125 mg Oral BID WC  .  docusate sodium  200 mg Oral Daily  . famotidine  20 mg Oral BID  . furosemide  40 mg Oral Daily  . insulin aspart  0-24 Units Subcutaneous TID AC & HS  . insulin detemir  20 Units Subcutaneous Daily  . levothyroxine  50 mcg Oral QAC breakfast  . lisinopril  2.5 mg Oral Daily  . potassium chloride  20 mEq Oral BID  . sodium chloride  3 mL Intravenous Q12H   Continuous Infusions:  PRN Meds:.sodium chloride, acetaminophen, bisacodyl **OR** bisacodyl, ondansetron **OR** ondansetron (ZOFRAN) IV, oxyCODONE, sodium chloride, traMADol  Xrays No results found.  Assessment/Plan: S/P Procedure(s) (LRB): CORONARY ARTERY BYPASS GRAFTING (CABG) x four,  using left internal mammary artery and right leg greater saphenous vein harvested endoscopically (N/A) TRANSESOPHAGEAL ECHOCARDIOGRAM (TEE) (N/A)  1 doing well overall 2 d/c epw's today 3 cont pulm toilet/rehab 4 no new labs 5 no bm- not eating much yet either 6 good diuresis, shoulfd be able to d/c lasix soon 7 SNF at discharge    LOS: 9 days    GOLD,WAYNE E 04/29/2015

## 2015-04-29 NOTE — Care Management Important Message (Signed)
Important Message  Patient Details  Name: Carol [name] MRN: [id number] Date of Birth: [birth year]   Medicare Important Message Given:  Yes-second notification given    Nathen May 04/29/2015, 10:31 AM

## 2015-04-29 NOTE — Care Management Note (Signed)
Case Management Note  Patient Details  Name: Carol [name] MRN: [id number] Date of Birth: 1935-10-21  Subjective/Objective:      Patient states plans to go to rehab prior to going home.  Daughter already working on arrangements and where to go.  I see SW has also already talked to patient prior to surgery.  PT consult placed.               Action/Plan:   Expected Discharge Date:                  Expected Discharge Plan:  [place] (Pt stated she is a widow, home alone prior to admit, pt verfied discharge plan to SNF)  In-House Referral:  Clinical Social Work  Discharge planning Services     Post Acute Care Choice:    Choice offered to:     DME Arranged:    DME Agency:     HH Arranged:    [place] Agency:     Status of Service:  In process, will continue to follow  Medicare Important Message Given:  Yes-second notification given Date Medicare IM Given:    Medicare IM give by:    Date Additional Medicare IM Given:    Additional Medicare Important Message give by:     If discussed at [place] of Stay Meetings, dates discussed:    Additional Comments:  Maryclare Labrador, RN 04/29/2015, 3:00 PM Case Management Note  Patient Details  Name: Carol [name] MRN: [id number] Date of Birth: Aug 06, 1935  Subjective/Objective:                    Action/Plan:   Expected Discharge Date:                  Expected Discharge Plan:  [place] (Pt stated she is a widow, home alone prior to admit, pt verfied discharge plan to SNF)  In-House Referral:  Clinical Social Work  Discharge planning Services     Post Acute Care Choice:    Choice offered to:     DME Arranged:    DME Agency:     HH Arranged:    [place] Agency:     Status of Service:  In process, will continue to follow  Medicare Important Message Given:  Yes-second notification given Date Medicare IM Given:    Medicare IM give by:    Date Additional Medicare IM Given:    Additional  Medicare Important Message give by:     If discussed at [place] of Stay Meetings, dates discussed:    Additional Comments:  Maryclare Labrador, RN 04/29/2015, 3:00 PM

## 2015-04-29 NOTE — Progress Notes (Signed)
Inpatient Diabetes Program Recommendations  AACE/ADA: New Consensus Statement on Inpatient Glycemic Control (2015)  Target Ranges:  Prepandial:   less than 140 mg/dL      Peak postprandial:   less than 180 mg/dL (1-2 hours)      Critically ill patients:  140 - 180 mg/dL   Review of Glycemic Control:  Results for Carol [name], Carol [name] (MRN [id number]) as of 04/29/2015 09:54  Ref. Range 04/28/2015 06:20 04/28/2015 11:14 04/28/2015 16:12 04/28/2015 21:31 04/29/2015 06:20  Glucose-Capillary Latest Ref Range: 65-99 mg/dL 69 252 (H) 122 (H) 104 (H) 93   Diabetes history: Type 2 diabetes Outpatient Diabetes medications: 70/30 32 units with breakfast Current orders for Inpatient glycemic control:  Levemir 20 units daily,  TCTS tid with meals and HS  Inpatient Diabetes Program Recommendations:    Consider reducing Levemir to 18 units daily and add Novolog meal coverage 3 units tid with meals.  Also please decrease Novolog correction to sensitive tid with meals and HS scale.  Thanks so much,  Adah Perl, RN, BC-ADM Inpatient Diabetes Coordinator Pager [phone] (8a-5p)

## 2015-04-30 DIAGNOSIS — I2510 Atherosclerotic heart disease of native coronary artery without angina pectoris: Secondary | ICD-10-CM | POA: Diagnosis not present

## 2015-04-30 DIAGNOSIS — I5022 Chronic systolic (congestive) heart failure: Secondary | ICD-10-CM | POA: Diagnosis not present

## 2015-04-30 DIAGNOSIS — N183 Chronic kidney disease, stage 3 (moderate): Secondary | ICD-10-CM | POA: Diagnosis not present

## 2015-04-30 DIAGNOSIS — E119 Type 2 diabetes mellitus without complications: Secondary | ICD-10-CM | POA: Diagnosis not present

## 2015-04-30 LAB — GLUCOSE, CAPILLARY
GLUCOSE-CAPILLARY: 82 mg/dL (ref 65–99)
GLUCOSE-CAPILLARY: 212 mg/dL — AB (ref 65–99)

## 2015-04-30 MED ORDER — OXYCODONE HCL 5 MG PO TABS: 5.0000 mg | ORAL_TABLET | ORAL | Status: DC | PRN

## 2015-04-30 MED ORDER — ASPIRIN 325 MG PO TBEC: 325.0000 mg | DELAYED_RELEASE_TABLET | Freq: Every day | ORAL | Status: DC

## 2015-04-30 MED ORDER — TRAMADOL HCL 50 MG PO TABS: 50.0000 mg | ORAL_TABLET | Freq: Every day | ORAL | Status: DC | PRN

## 2015-04-30 NOTE — Clinical Social Work Placement (Signed)
   CLINICAL SOCIAL WORK PLACEMENT  NOTE  Date:  04/30/2015  Patient Details  Name: Carol [name] MRN: [id number] Date of Birth: 1935-08-25  Clinical Social Work is seeking post-discharge placement for this patient at the [place] level of care (*CSW will initial, date and re-position this form in  chart as items are completed):  Yes   Patient/family provided with [place] Work Department's list of facilities offering this level of care within the geographic area requested by the patient (or if unable, by the patient's family).  Yes   Patient/family informed of their freedom to choose among providers that offer the needed level of care, that participate in Medicare, Medicaid or managed care program needed by the patient, have an available bed and are willing to accept the patient.  Yes   Patient/family informed of [facility]'s ownership interest in [facility] and [facility], as well as of the fact that they are under no obligation to receive care at these facilities.  PASRR submitted to EDS on       PASRR number received on       Existing PASRR number confirmed on 04/23/15     FL2 transmitted to all facilities in geographic area requested by pt/family on       FL2 transmitted to all facilities within larger geographic area on       Patient informed that his/her managed care company has contracts with or will negotiate with certain facilities, including the following:   [facility]- Medicare)     Yes   Patient/family informed of bed offers received.  Patient chooses bed at  ([place])     Physician recommends and patient chooses bed at      Patient to be transferred to  ([place]) on 04/30/15.  Patient to be transferred to facility by  (Pt's dtr, Clarene Critchley transporting patient)     Patient family notified on 04/30/15 of transfer.  Name of family member notified:   (Pt's dtr, Clarene Critchley )      PHYSICIAN Please prepare priority discharge summary, including medications, Please sign FL2, Please prepare prescriptions     Additional Comment:    _______________________________________________ Rozell Searing, LCSW 04/30/2015, 10:31 AM

## 2015-04-30 NOTE — Discharge Instructions (Signed)
Coronary Artery Bypass Grafting, Care After °Refer to this sheet in the next few weeks. These instructions provide you with information on caring for yourself after your procedure. Your health care provider may also give you more specific instructions. Your treatment has been planned according to current medical practices, but problems sometimes occur. Call your health care provider if you have any problems or questions after your procedure. °WHAT TO EXPECT AFTER THE PROCEDURE °Recovery from surgery will be different for everyone. Some people feel well after 3 or 4 weeks, while for others it takes longer. After your procedure, it is typical to have the following: °· Nausea and a lack of appetite.   °· Constipation. °· Weakness and fatigue.   °· Depression or irritability.   °· Pain or discomfort at your incision site. °HOME CARE INSTRUCTIONS °· Take medicines only as directed by your health care provider. Do not stop taking medicines or start any new medicines without first checking with your health care provider. °· Take your pulse as directed by your health care provider. °· Perform deep breathing as directed by your health care provider. If you were given a device called an incentive spirometer, use it to practice deep breathing several times a day. Support your chest with a pillow or your arms when you take deep breaths or cough. °· Keep incision areas clean, dry, and protected. Remove or change any bandages (dressings) only as directed by your health care provider. You may have skin adhesive strips over the incision areas. Do not take the strips off. They will fall off on their own. °· Check incision areas daily for any swelling, redness, or drainage. °· If incisions were made in your legs, do the following: °¨ Avoid crossing your legs.   °¨ Avoid sitting for long periods of time. Change positions every 30 minutes.   °¨ Elevate your legs when you are sitting. °· Wear compression stockings as directed by your  health care provider. These stockings help keep blood clots from forming in your legs. °· Take showers once your health care provider approves. Until then, only take sponge baths. Pat incisions dry. Do not rub incisions with a washcloth or towel. Do not take baths, swim, or use a hot tub until your health care provider approves. °· Eat foods that are high in fiber, such as raw fruits and vegetables, whole grains, beans, and nuts. Meats should be lean cut. Avoid canned, processed, and fried foods. °· Drink enough fluid to keep your urine clear or pale yellow. °· Weigh yourself every day. This helps identify if you are retaining fluid that may make your heart and lungs work harder. °· Rest and limit activity as directed by your health care provider. You may be instructed to: °¨ Stop any activity at once if you have chest pain, shortness of breath, irregular heartbeats, or dizziness. Get help right away if you have any of these symptoms. °¨ Move around frequently for short periods or take short walks as directed by your health care provider. Increase your activities gradually. You may need physical therapy or cardiac rehabilitation to help strengthen your muscles and build your endurance. °¨ Avoid lifting, pushing, or pulling anything heavier than 10 lb (4.5 kg) for at least 6 weeks after surgery. °· Do not drive until your health care provider approves.  °· Ask your health care provider when you may return to work. °· Ask your health care provider when you may resume sexual activity. °· Keep all follow-up visits as directed by your health care   provider. This is important. °SEEK MEDICAL CARE IF: °· You have swelling, redness, increasing pain, or drainage at the site of an incision. °· You have a fever. °· You have swelling in your ankles or legs. °· You have pain in your legs.   °· You gain 2 or more pounds (0.9 kg) a day. °· You are nauseous or vomit. °· You have diarrhea.  °SEEK IMMEDIATE MEDICAL CARE IF: °· You have  chest pain that goes to your jaw or arms. °· You have shortness of breath.   °· You have a fast or irregular heartbeat.   °· You notice a "clicking" in your breastbone (sternum) when you move.   °· You have numbness or weakness in your arms or legs. °· You feel dizzy or light-headed.   °MAKE SURE YOU: °· Understand these instructions. °· Will watch your condition. °· Will get help right away if you are not doing well or get worse. °  °This information is not intended to replace advice given to you by your health care provider. Make sure you discuss any questions you have with your health care provider. °  °Document Released: 12/29/2004 Document Revised: 07/02/2014 Document Reviewed: 11/18/2012 °Elsevier Interactive Patient Education ©2016 [employer] ° °Endoscopic Saphenous Vein Harvesting, Care After °Refer to this sheet in the next few weeks. These instructions provide you with information on caring for yourself after your procedure. Your health care provider may also give you more specific instructions. Your treatment has been planned according to current medical practices, but problems sometimes occur. Call your health care provider if you have any problems or questions after your procedure. °HOME CARE INSTRUCTIONS °Medicine °· Take whatever pain medicine your surgeon prescribes. Follow the directions carefully. Do not take over-the-counter pain medicine unless your surgeon says it is okay. Some pain medicine can cause bleeding problems for several weeks after surgery. °· Follow your surgeon's instructions about driving. You will probably not be permitted to drive after heart surgery. °· Take any medicines your surgeon prescribes. Any medicines you took before your heart surgery should be checked with your health care provider before you start taking them again. °Wound care °· If your surgeon has prescribed an elastic bandage or stocking, ask how long you should wear it. °· Check the area around your surgical  cuts (incisions) whenever your bandages (dressings) are changed. Look for any redness or swelling. °· You will need to return to have the stitches (sutures) or staples taken out. Ask your surgeon when to do that. °· Ask your surgeon when you can shower or bathe. °Activity °· Try to keep your legs raised when you are sitting. °· Do any exercises your health care providers have given you. These may include deep breathing exercises, coughing, walking, or other exercises. °SEEK MEDICAL CARE IF: °· You have any questions about your medicines. °· You have more leg pain, especially if your pain medicine stops working. °· New or growing bruises develop on your leg. °· Your leg swells, feels tight, or becomes red. °· You have numbness in your leg. °SEEK IMMEDIATE MEDICAL CARE IF: °· Your pain gets much worse. °· Blood or fluid leaks from any of the incisions. °· Your incisions become warm, swollen, or red. °· You have chest pain. °· You have trouble breathing. °· You have a fever. °· You have more pain near your leg incision. °MAKE SURE YOU: °· Understand these instructions. °· Will watch your condition. °· Will get help right away if you are not doing well or   get worse. °  °This information is not intended to replace advice given to you by your health care provider. Make sure you discuss any questions you have with your health care provider. °  °Document Released: 02/21/2011 Document Revised: 07/02/2014 Document Reviewed: 02/21/2011 °Elsevier Interactive Patient Education ©2016 Elsevier Inc. ° ° °

## 2015-04-30 NOTE — Clinical Social Work Note (Signed)
Clinical Social Worker facilitated patient discharge including contacting patient family and facility to confirm patient discharge plans.  Clinical information faxed to facility and family agreeable with plan.  Patient's daughter, Clarene Critchley to transport patient to [place].  RN to call report prior to discharge [phone].  Clinical Social Worker will sign off for now as social work intervention is no longer needed. Please consult [place] again if new need arises.  Glendon Axe, MSW, LCSWA [phone] 04/30/2015 10:32 AM

## 2015-04-30 NOTE — Progress Notes (Signed)
      [address]       [address] [place] [address]             [phone]     5 Days Post-Op Procedure(s) (LRB): CORONARY ARTERY BYPASS GRAFTING (CABG) x four,  using left internal mammary artery and right leg greater saphenous vein harvested endoscopically (N/A) TRANSESOPHAGEAL ECHOCARDIOGRAM (TEE) (N/A)   Subjective:  Ms. Lucas has no complaints this morning.  She looks great and is hopeful to be discharged today.  + ambulation... No BM... However not eating very much is passing flatus  Objective: Vital signs in last 24 hours: Temp:  [98.1 F (36.7 C)-99.3 F (37.4 C)] 98.5 F (36.9 C) (11/05 0443) Pulse Rate:  [70-80] 70 (11/05 0443) Cardiac Rhythm:  [-] Normal sinus rhythm (11/04 1950) Resp:  [18-20] 18 (11/05 0443) BP: (125-152)/(47-55) 129/54 mmHg (11/05 0443) SpO2:  [96 %-97 %] 97 % (11/05 0443) Weight:  [216 lb 7.9 oz (98.2 kg)] 216 lb 7.9 oz (98.2 kg) (11/05 0443)  Intake/Output from previous day: 11/04 0701 - 11/05 0700 In: 360 [P.O.:360] Out: 300 [Urine:300]  General appearance: alert, cooperative and no distress Heart: regular rate and rhythm Lungs: clear to auscultation bilaterally Abdomen: soft, non-tender; bowel sounds normal; no masses,  no organomegaly Extremities: edema trace Wound: clean and dry  Lab Results:  Recent Labs  04/28/15 0318  WBC 8.3  HGB 9.7*  HCT 30.1*  PLT 136*   BMET: No results for input(s): NA, K, CL, CO2, GLUCOSE, BUN, CREATININE, CALCIUM in the last 72 hours.  PT/INR: No results for input(s): LABPROT, INR in the last 72 hours. ABG    Component Value Date/Time   PHART 7.396 04/26/2015 0414   HCO3 22.5 04/26/2015 0414   TCO2 20 04/26/2015 1548   ACIDBASEDEF 2.0 04/26/2015 0414   O2SAT 99.0 04/26/2015 0414   CBG (last 3)   Recent Labs  04/29/15 1641 04/29/15 2103 04/30/15 0124  GLUCAP 124* 228* 82    Assessment/Plan: S/P Procedure(s) (LRB): CORONARY ARTERY BYPASS GRAFTING (CABG) x four,  using  left internal mammary artery and right leg greater saphenous vein harvested endoscopically (N/A) TRANSESOPHAGEAL ECHOCARDIOGRAM (TEE) (N/A)  1. CV- hemodynamically stable, continue lopressor, Lisinopril 2. Pulm- no acute issues, off oxygen, continue IS 3. Renal- creatinine has been stable, volume status is stable will taper off Lasix 4. GI - Lactulose prn  5. Deconditioning- SNF bed at [facility] 6. Dispo- patient is medically stable for discharge, will contact SW if bed available today will d/c otherwise will plan for Monday    LOS: 10 days    Broady Lafoy 04/30/2015

## 2015-05-09 ENCOUNTER — Encounter (HOSPITAL_COMMUNITY): Payer: Medicare Other

## 2015-05-11 DIAGNOSIS — E1122 Type 2 diabetes mellitus with diabetic chronic kidney disease: Secondary | ICD-10-CM | POA: Diagnosis not present

## 2015-05-11 DIAGNOSIS — Z7902 Long term (current) use of antithrombotics/antiplatelets: Secondary | ICD-10-CM | POA: Diagnosis not present

## 2015-05-11 DIAGNOSIS — Z48812 Encounter for surgical aftercare following surgery on the circulatory system: Secondary | ICD-10-CM | POA: Diagnosis not present

## 2015-05-11 DIAGNOSIS — I5022 Chronic systolic (congestive) heart failure: Secondary | ICD-10-CM | POA: Diagnosis not present

## 2015-05-11 DIAGNOSIS — E785 Hyperlipidemia, unspecified: Secondary | ICD-10-CM | POA: Diagnosis not present

## 2015-05-11 DIAGNOSIS — J45909 Unspecified asthma, uncomplicated: Secondary | ICD-10-CM | POA: Diagnosis not present

## 2015-05-11 DIAGNOSIS — Z7982 Long term (current) use of aspirin: Secondary | ICD-10-CM | POA: Diagnosis not present

## 2015-05-11 DIAGNOSIS — N183 Chronic kidney disease, stage 3 (moderate): Secondary | ICD-10-CM | POA: Diagnosis not present

## 2015-05-11 DIAGNOSIS — Z794 Long term (current) use of insulin: Secondary | ICD-10-CM | POA: Diagnosis not present

## 2015-05-11 DIAGNOSIS — Z602 Problems related to living alone: Secondary | ICD-10-CM | POA: Diagnosis not present

## 2015-05-11 DIAGNOSIS — I252 Old myocardial infarction: Secondary | ICD-10-CM | POA: Diagnosis not present

## 2015-05-11 DIAGNOSIS — I130 Hypertensive heart and chronic kidney disease with heart failure and stage 1 through stage 4 chronic kidney disease, or unspecified chronic kidney disease: Secondary | ICD-10-CM | POA: Diagnosis not present

## 2015-05-11 DIAGNOSIS — I2510 Atherosclerotic heart disease of native coronary artery without angina pectoris: Secondary | ICD-10-CM | POA: Diagnosis not present

## 2015-05-12 DIAGNOSIS — Z7982 Long term (current) use of aspirin: Secondary | ICD-10-CM | POA: Diagnosis not present

## 2015-05-12 DIAGNOSIS — N183 Chronic kidney disease, stage 3 (moderate): Secondary | ICD-10-CM | POA: Diagnosis not present

## 2015-05-12 DIAGNOSIS — Z602 Problems related to living alone: Secondary | ICD-10-CM | POA: Diagnosis not present

## 2015-05-12 DIAGNOSIS — I2510 Atherosclerotic heart disease of native coronary artery without angina pectoris: Secondary | ICD-10-CM | POA: Diagnosis not present

## 2015-05-12 DIAGNOSIS — E785 Hyperlipidemia, unspecified: Secondary | ICD-10-CM | POA: Diagnosis not present

## 2015-05-12 DIAGNOSIS — I252 Old myocardial infarction: Secondary | ICD-10-CM | POA: Diagnosis not present

## 2015-05-12 DIAGNOSIS — Z48812 Encounter for surgical aftercare following surgery on the circulatory system: Secondary | ICD-10-CM | POA: Diagnosis not present

## 2015-05-12 DIAGNOSIS — Z7902 Long term (current) use of antithrombotics/antiplatelets: Secondary | ICD-10-CM | POA: Diagnosis not present

## 2015-05-12 DIAGNOSIS — Z794 Long term (current) use of insulin: Secondary | ICD-10-CM | POA: Diagnosis not present

## 2015-05-12 DIAGNOSIS — E1122 Type 2 diabetes mellitus with diabetic chronic kidney disease: Secondary | ICD-10-CM | POA: Diagnosis not present

## 2015-05-12 DIAGNOSIS — J45909 Unspecified asthma, uncomplicated: Secondary | ICD-10-CM | POA: Diagnosis not present

## 2015-05-12 DIAGNOSIS — I5022 Chronic systolic (congestive) heart failure: Secondary | ICD-10-CM | POA: Diagnosis not present

## 2015-05-12 DIAGNOSIS — I130 Hypertensive heart and chronic kidney disease with heart failure and stage 1 through stage 4 chronic kidney disease, or unspecified chronic kidney disease: Secondary | ICD-10-CM | POA: Diagnosis not present

## 2015-05-13 DIAGNOSIS — I252 Old myocardial infarction: Secondary | ICD-10-CM | POA: Diagnosis not present

## 2015-05-13 DIAGNOSIS — N183 Chronic kidney disease, stage 3 (moderate): Secondary | ICD-10-CM | POA: Diagnosis not present

## 2015-05-13 DIAGNOSIS — J45909 Unspecified asthma, uncomplicated: Secondary | ICD-10-CM | POA: Diagnosis not present

## 2015-05-13 DIAGNOSIS — I5022 Chronic systolic (congestive) heart failure: Secondary | ICD-10-CM | POA: Diagnosis not present

## 2015-05-13 DIAGNOSIS — I2510 Atherosclerotic heart disease of native coronary artery without angina pectoris: Secondary | ICD-10-CM | POA: Diagnosis not present

## 2015-05-13 DIAGNOSIS — E1122 Type 2 diabetes mellitus with diabetic chronic kidney disease: Secondary | ICD-10-CM | POA: Diagnosis not present

## 2015-05-13 DIAGNOSIS — I130 Hypertensive heart and chronic kidney disease with heart failure and stage 1 through stage 4 chronic kidney disease, or unspecified chronic kidney disease: Secondary | ICD-10-CM | POA: Diagnosis not present

## 2015-05-13 DIAGNOSIS — Z602 Problems related to living alone: Secondary | ICD-10-CM | POA: Diagnosis not present

## 2015-05-13 DIAGNOSIS — Z794 Long term (current) use of insulin: Secondary | ICD-10-CM | POA: Diagnosis not present

## 2015-05-13 DIAGNOSIS — E785 Hyperlipidemia, unspecified: Secondary | ICD-10-CM | POA: Diagnosis not present

## 2015-05-13 DIAGNOSIS — Z7982 Long term (current) use of aspirin: Secondary | ICD-10-CM | POA: Diagnosis not present

## 2015-05-13 DIAGNOSIS — Z7902 Long term (current) use of antithrombotics/antiplatelets: Secondary | ICD-10-CM | POA: Diagnosis not present

## 2015-05-13 DIAGNOSIS — Z48812 Encounter for surgical aftercare following surgery on the circulatory system: Secondary | ICD-10-CM | POA: Diagnosis not present

## 2015-05-15 NOTE — Progress Notes (Signed)
Admission History and Physical   Date:  05/16/2015   ID:  Carol [name], DOB 1936-03-21, MRN [id number]  Patient Care Team: Lillard Anes, MD as PCP - General (Family Medicine) Burnell Blanks, MD as Consulting Physician (Cardiology)    Chief Complaint  Patient presents with  . Hospitalization Follow-up    s/p CABG  . Coronary Artery Disease     History of Present Illness: Carol [name] is a 79 y.o. female with a hx of CAD, diabetes, HTN, HL, asthma, hypothyroidism. She was initially seen by Dr. Angelena Form in 6/14 for chest pain. Stress test was normal. She was then admitted 7/15 with non-STEMI. This was treated with balloon angioplasty of a moderate caliber, tortuous OM2 branch. She had severe disease in a small PDA that was managed medically. EF was 25-30% at that time. She was diuresed for acute CHF. Readmitted 8/15 with chest pain. Stress Myoview demonstrated lateral defect consistent with ischemia. [place] 8/15 was performed and she underwent to overlapping DES to the OM2 branch. The PDA was treated medically. She was readmitted 9/15 and LHC demonstrated patent stents in the OM2 and severe disease in the PDA. Balloon angioplasty was performed of the PDA branch. Vessel was too small for a stent. There appeared to be a bridging segment of the OM2 during systole jsut before the stent.  EF was 50-55%. She underwent back surgery 6/16.   She was admitted from the office in 04/19/15 with chest pain syndrome consistent with unstable angina. CEs remained neg.  Cardiac catheterization demonstrated probable mid to distal left main disease, patent OM1 stents, mid RCA 50-70% stenosis and 50% stenosis of the LV branch at site of previous PTCA. Chest CT was obtained to further examine the left main stenosis. This demonstrated significant mixed plaque with lucent core suggesting high risk feature in the LM felt to be 50-75%. Patient was seen by Dr. Cyndia Bent and underwent CABG 04/25/15 with  a LIMA-LAD, SVG-OM2/OM3, SVG-RCA. Postoperative course was fairly uneventful. She remained in normal sinus rhythm. She was discharged to SNF on 04/30/15.  Returns for follow-up.  She has been doing very well. She is back home now.  She notes her chest soreness is improved.  She still has chest tube stitches in place.  She denies orthopnea, PND.  LE edema fluctuates and she take extra Lasix prn.  She denies significant DOE. She denies coughing, wheezing, fevers, chills.  She felt like her sugar was low and we gave her coca-cola to drink and some candy to eat.  She felt better with this. But, her sugar (on our machine) was 315.  She sees her PCP tomorrow for FU.      Studies/Reports Reviewed Today:  Cardiac CTA 04/21/15 IMPRESSION: 1) Significant mixed plaque in the LM coronary artery including soft plaque with lucent core suggesting high risk feature and correlating with "hazy" appearance on cath. Stenosis 50-75% And with morphologic features this would be considered surgical Disease IMPRESSION: 1. Solitary 3 mm right middle lobe ground-glass pulmonary nodule. No further follow-up is required. This recommendation follows the consensus statement: Recommendations for the Management of Subsolid Pulmonary Nodules Detected at CT: A Statement from the [place] as published in Radiology 2013; 266:304-317. 2. Small to moderate hiatal hernia.  LHC 04/20/15  Probable mid to distal left main disease. Rule out contrast streaming.  Patent OM1 stents placed one year ago. Irregularities noted in the mid circumflex.  New mid RCA 50-70% stenosis. 50% left ventricular branch at  the site of previous PTCA.  Overall normal left ventricular function. Ejection fraction 50%. LM:  50% LAD:  prox 25% LCx: dist 40%, OM1 50%; OM1 stent patent RCA:  Mid 70%; RPLB1 70%  Carotid US 04/21/15 1-39% Bilateral ICA  Echo 03/04/15 EF 123456, grade 1 diastolic dysfunction  LHC 02/24/14 LM: Mid 20% LAD:  Proximal 40-50%, mid 30% LCx: Proximal 30%, proximal RI 50% (small caliber), OM2 stents patent with bridging in the proximal OM2 before the stent, mid AV groove 40% RCA: Proximal 30%, mid 40%, mid PDA 99% >> PCI: POBA (no stent secondary to small vessel size)  LHC 02/17/14 OM2 99% >> PCI: 2.25 x 12 mm Xience DES and 2.25 x 8 mm Xience DES -overlapping   Myoview 8/15 IMPRESSION: 1. New LEFT lateral wall infarction. Small area of mildly decreased perfusion in the inferior anterior wall near the apex. 2. Mild hypokinesia associated with LEFT lateral wall infarction. 3. Left ventricular ejection fraction 63%.  4. Intermediate-risk stress test findings*.    Past Medical History  Diagnosis Date  . Hypertension   . Asthma   . Hyperlipidemia     a. H/o intolerance to Zocor, Lipitor. Had muscle aches on higher dose Crestor.  . Gout, unspecified   . CAD S/P percutaneous coronary angioplasty 01/22/2014    A. (12/2013): POBA - OM2 99% (very tortuous segment) - reduced ~ 50% & TIMI 3 flow, 80-90% stenosis in mid PDA, Irregularities <50% in mRCA, pLAD and prox LCx; b. 02/17/14: Moderate sized fixed defect in Lateral wall --> cath with OM2 restenosis & otherwise stable --> Xience Alpine DES x 2 (2.25 mm x 12 & 8 mm). c. NSTEMI 02/2014 s/p PTCA/balloon angioplasty only to small caliber PDA.  Marland Kitchen Chronic systolic heart failure ([place])     a. ICM b. ECHO (01/2014): EF 25-30%, grade I DD, trivial MR. c. EF by Myoview 02/17/14 = ~53%. d. EF by cath 02/24/14 - improved to 50-55%.  . Environmental allergies   . Type II diabetes mellitus ([place])   . GERD (gastroesophageal reflux disease)   . TIA (transient ischemic attack) 1990's    a. TIA vs stroke 1990's "mild" - sounds like a TIA as she was told she had stroke symptoms but negative scans. Denies residual effects.  . Contrast media allergy   . QT prolongation     a. Noted on EKG 02/2014.  Marland Kitchen Anemia     a. Noted on labs 02/2014 possibly procedurally related.  Marland Kitchen LBBB  (left bundle branch block)     a. Transient during 02/2014 admission.  . CKD (chronic kidney disease), stage III   . Sinus bradycardia     a. HR 40s-50s during 02/2014 admission, limiting BB dose.  . NSTEMI (non-ST elevated myocardial infarction) ([place]) 2014; 12/2013, 01/2014  . Hypothyroidism     Past Surgical History  Procedure Laterality Date  . Shoulder open rotator cuff repair Bilateral 1980's - 1990's  . Total knee arthroplasty Right 2009  . Cesarean section  [id number]; 1960  . Appendectomy  1959  . Cholecystectomy  1989  . Reduction mammaplasty Bilateral 1990's  . Cataract extraction w/ intraocular lens  implant, bilateral Bilateral 2013  . Coronary angioplasty  12/2013    POBA - OM2  . Percutaneous coronary stent intervention (pci-s)  02/17/14    For restenosis of OM2 - PCI with Xience Apline DES 2.25 mm x 12 mm & 2.25 mm x 8 mm overlapping  . Left heart catheterization with coronary angiogram N/A  01/22/2014    Procedure: LEFT HEART CATHETERIZATION WITH CORONARY ANGIOGRAM;  Surgeon: Sinclair Grooms, MD;  Location: [facility] CATH LAB;  Service: Cardiovascular;  Laterality: N/A;  . Left heart catheterization with coronary angiogram N/A 02/17/2014    Procedure: LEFT HEART CATHETERIZATION WITH CORONARY ANGIOGRAM;  Surgeon: Burnell Blanks, MD;  Location: [facility] CATH LAB;  Service: Cardiovascular;  Laterality: N/A;  . Left heart catheterization with coronary angiogram N/A 02/24/2014    Procedure: LEFT HEART CATHETERIZATION WITH CORONARY ANGIOGRAM;  Surgeon: Burnell Blanks, MD;  Location: [facility] CATH LAB;  Service: Cardiovascular;  Laterality: N/A;  . Cardiac catheterization  02/24/2014    Procedure: CORONARY BALLOON ANGIOPLASTY;  Surgeon: Burnell Blanks, MD;  Location: [facility] CATH LAB;  Service: Cardiovascular;;  . Abdominal hysterectomy      complete  . Joint replacement      right knee  . Lumbar laminectomy/decompression microdiscectomy Left 12/22/2014    Procedure: CENTRAL DECOMPRESSIVE  LUMBAR, AND LAMINECTOMY L5-S1,  S1-S2 FOR SPINAL STENOSIS FORAMINOTOMY L5, S1, S2 ROOTS ON LEFT FOR FORAMINAL STENOSIS, AND DECOMPRESSION L5-S1, S1-S2 ACCORDING TO LABELING OF [place] X-RAYS;  Surgeon: Latanya Maudlin, MD;  Location: [facility] ORS;  Service: Orthopedics;  Laterality: Left;  . Cardiac catheterization N/A 04/20/2015    Procedure: Left Heart Cath and Coronary Angiography;  Surgeon: Belva Crome, MD;  Location: [place] CV LAB;  Service: Cardiovascular;  Laterality: N/A;  . Coronary artery bypass graft N/A 04/25/2015    Procedure: CORONARY ARTERY BYPASS GRAFTING (CABG) x four,  using left internal mammary artery and right leg greater saphenous vein harvested endoscopically;  Surgeon: Gaye Pollack, MD;  Location: [place];  Service: Open Heart Surgery;  Laterality: N/A;  . Tee without cardioversion N/A 04/25/2015    Procedure: TRANSESOPHAGEAL ECHOCARDIOGRAM (TEE);  Surgeon: Gaye Pollack, MD;  Location: [place];  Service: Open Heart Surgery;  Laterality: N/A;     Current Outpatient Prescriptions  Medication Sig Dispense Refill  . allopurinol (ZYLOPRIM) 300 MG tablet Take 300 mg by mouth every morning.     Marland Kitchen aspirin EC 81 MG EC tablet Take 1 tablet (81 mg total) by mouth daily. 30 tablet 3  . Biotin 5000 MCG CAPS Take 1 capsule by mouth every morning.     . carvedilol (COREG) 3.125 MG tablet Take 1 tablet (3.125 mg total) by mouth 2 (two) times daily with a meal. 60 tablet 11  . clopidogrel (PLAVIX) 75 MG tablet TAKE 1 TABLET BY MOUTH ONCE DAILY. 30 tablet 3  . colchicine 0.6 MG tablet Take 0.6 mg by mouth daily as needed (gout flare up.).     Marland Kitchen famotidine (PEPCID AC) 10 MG tablet Take 1 tablet (10 mg total) by mouth 2 (two) times daily. (Patient taking differently: Take 10 mg by mouth 2 (two) times daily as needed for heartburn or indigestion. ) 60 tablet 6  . furosemide (LASIX) 40 MG tablet Take 40 mg by mouth daily.     . insulin NPH-regular Human (HUMULIN 70/30) (70-30) 100  UNIT/ML injection Inject 32 Units into the skin daily with breakfast. And syringes 1/day 10 mL 11  . isosorbide mononitrate (IMDUR) 30 MG 24 hr tablet Take 1 tablet (30 mg total) by mouth 2 (two) times daily. 60 tablet 6  . levothyroxine (SYNTHROID, LEVOTHROID) 50 MCG tablet Take 50 mcg by mouth every morning.     Marland Kitchen lisinopril (PRINIVIL,ZESTRIL) 2.5 MG tablet Take 1 tablet (2.5 mg total) by mouth daily. (Patient  taking differently: Take 2.5 mg by mouth every morning. ) 30 tablet 0  . ONE TOUCH ULTRA TEST test strip 1 each by Other route as needed for other.     . oxyCODONE (OXY IR/ROXICODONE) 5 MG immediate release tablet Take 1-2 tablets (5-10 mg total) by mouth every 3 (three) hours as needed for severe pain. 30 tablet 0  . pantoprazole (PROTONIX) 40 MG tablet TAKE 1 TABLET BY MOUTH ONCE DAILY. 30 tablet 3  . PROAIR HFA 108 (90 BASE) MCG/ACT inhaler Inhale 1-2 puffs into the lungs every 4 (four) hours as needed for wheezing or shortness of breath. For breathing    . traMADol (ULTRAM) 50 MG tablet Take 1 tablet (50 mg total) by mouth daily as needed for moderate pain. 30 tablet 0   No current facility-administered medications for this visit.    Allergies:   Contrast media; Cortizone-10; Lipitor; and Zocor    Social History:  The patient  reports that she has never smoked. She has never used smokeless tobacco. She reports that she does not drink alcohol or use illicit drugs.   Family History:  The patient's family history includes Cancer in her sister; Diabetes in her brother and mother; Emphysema in her father; Heart attack (age of onset: 95) in her mother; Heart disease in her brother and father; Hypertension in her mother. There is no history of Stroke.    ROS:   Please see the history of present illness.   Review of Systems  Constitution: Positive for decreased appetite.  HENT: Positive for hearing loss.   Eyes: Positive for visual disturbance.  Cardiovascular: Positive for leg  swelling.  Neurological: Positive for dizziness.  All other systems reviewed and are negative.     PHYSICAL EXAM: VS:  BP 160/50 mmHg  Pulse 70  Ht 5\' 7"  (1.702 m)  Wt 215 lb 12.8 oz (97.886 kg)  BMI 33.79 kg/m2    Wt Readings from Last 3 Encounters:  05/16/15 215 lb 12.8 oz (97.886 kg)  04/30/15 216 lb 7.9 oz (98.2 kg)  04/19/15 218 lb 9.6 oz (99.156 kg)     GEN: Well nourished, well developed, in no acute distress HEENT: normal Neck: no JVD, no masses Cardiac:  Normal S1/S2, RRR; no murmur, no rubs or gallops, 1+ bilateral ankle edema   Chest:  Median sternotomy well healed without erythema or discharge  Respiratory:  clear to auscultation bilaterally, no wheezing, rhonchi or rales. GI: soft, nontender, nondistended, + BS MS: no deformity or atrophy Skin: warm and dry  Neuro:  CNs II-XII intact, Strength and sensation are intact Psych: Normal affect   EKG:  EKG is ordered today.  It demonstrates:  NSR, HR 70, low voltage, TWI in 2, 3, aVF, V4-6, QTc 410 ms   Recent Labs: 04/19/2015: ALT 15; B Natriuretic Peptide 36.6; TSH 7.021* 04/26/2015: Magnesium 2.5* 04/27/2015: BUN 15; Creatinine, Ser 1.00; Potassium 3.8; Sodium 138 04/28/2015: Hemoglobin 9.7*; Platelets 136*    Lipid Panel    Component Value Date/Time   CHOL 176 07/26/2014 1115   TRIG 195.0* 07/26/2014 1115   HDL 42.90 07/26/2014 1115   CHOLHDL 4 07/26/2014 1115   VLDL 39.0 07/26/2014 1115   LDLCALC 94 07/26/2014 1115      ASSESSMENT AND PLAN:  1. CAD:  S/p multiple prior PCIs with most recent PCI with overlapping DES x 2 to OM2 in 8/15and POBA to a small PDA branch in 9/15.  She was admitted last month with recurrent [place] and  underwent subsequent CABG given significant LM stenosis.  She is progressing well.  She is not interested in formal cardiac rehab.  I have asked her to continue increasing her walking on her own. Continue ASA, Plavix, beta-blocker, nitrates, ACE inhibitor.  She is intol of statins.      2. Ischemic Cardiomyopathy:  Prior EF 25-30% in 8/15.  EF has returned to normal.  Continue beta-blocker, ACE inhibitor.     3. Chronic Diastolic CHF:  Volume overall stable.  She takes extra Lasix prn increased swelling.   4. HTN:  BP at home is optimal.  Repeat BP by me 144/70.  Continue current regimen.  5. Hyperlipidemia:  Intol of statins.  Consider referral to Lipid clinic in the future (?PCSK-9 inhibitors).  6. Diabetes Mellitus:  Continue Novolin 70/30.  Sugar high in the office today.  She felt like it was running low. She sees PCP tomorrow.  7. L Carotid Bruit:  1-39% Bilateral ICA on pre-CABG dopplers.  Repeat Carotid US in 1-2 years.   8. Lung Nodule:  Small nodule noted on Cardiac CTA and no FU recommended per radiologist.      Medication Changes: Current medicines are reviewed at length with the patient today.  Concerns regarding medicines are as outlined above.  The following changes have been made:   Discontinued Medications   No medications on file   Modified Medications   No medications on file   New Prescriptions   No medications on file   Labs/ tests ordered today include:   Orders Placed This Encounter  Procedures  . EKG 12-Lead     Disposition:    FU with Dr. Lauree Chandler 2 mos.  Dr. Vivi Martens office will see her today to get chest tube stitches out.     Signed, [name], MHS 05/16/2015 1:00 PM    [facility] [address], [place], [place]  [address] Phone: [phone]; Fax: [phone]

## 2015-05-16 ENCOUNTER — Ambulatory Visit (INDEPENDENT_AMBULATORY_CARE_PROVIDER_SITE_OTHER): Payer: Medicare Other | Admitting: Physician Assistant

## 2015-05-16 ENCOUNTER — Encounter: Payer: Self-pay | Admitting: Physician Assistant

## 2015-05-16 ENCOUNTER — Encounter (INDEPENDENT_AMBULATORY_CARE_PROVIDER_SITE_OTHER): Payer: Self-pay

## 2015-05-16 VITALS — BP 160/50 | HR 70 | Ht 67.0 in | Wt 215.8 lb

## 2015-05-16 DIAGNOSIS — I2510 Atherosclerotic heart disease of native coronary artery without angina pectoris: Secondary | ICD-10-CM | POA: Diagnosis not present

## 2015-05-16 DIAGNOSIS — I255 Ischemic cardiomyopathy: Secondary | ICD-10-CM | POA: Diagnosis not present

## 2015-05-16 DIAGNOSIS — Z951 Presence of aortocoronary bypass graft: Secondary | ICD-10-CM

## 2015-05-16 DIAGNOSIS — I10 Essential (primary) hypertension: Secondary | ICD-10-CM

## 2015-05-16 DIAGNOSIS — Z9861 Coronary angioplasty status: Secondary | ICD-10-CM | POA: Diagnosis not present

## 2015-05-16 DIAGNOSIS — E1159 Type 2 diabetes mellitus with other circulatory complications: Secondary | ICD-10-CM

## 2015-05-16 DIAGNOSIS — I5032 Chronic diastolic (congestive) heart failure: Secondary | ICD-10-CM

## 2015-05-16 DIAGNOSIS — I6523 Occlusion and stenosis of bilateral carotid arteries: Secondary | ICD-10-CM

## 2015-05-16 DIAGNOSIS — E785 Hyperlipidemia, unspecified: Secondary | ICD-10-CM

## 2015-05-16 NOTE — Patient Instructions (Signed)
Medication Instructions:  Your physician recommends that you continue on your current medications as directed. Please refer to the Current Medication list given to you today.   Labwork: NONE  Testing/Procedures: NONE  Follow-Up: DR. Angelena Form IN 2 MONTHS ; I WILL HAVE PAT, DR. Angelena Form TO CALL YOU WITH AN APPT.   Any Other Special Instructions Will Be Listed Below (If Applicable). WE SPOKE WITH DR. BARTLE'S OFFICE TODAY AND THEY HAVE INSTRUCTED FOR YOU TO COME BY AND GET YOUR STITCHES TAKEN OUT   If you need a refill on your cardiac medications before your next appointment, please call your pharmacy.

## 2015-05-17 DIAGNOSIS — Z7982 Long term (current) use of aspirin: Secondary | ICD-10-CM | POA: Diagnosis not present

## 2015-05-17 DIAGNOSIS — I130 Hypertensive heart and chronic kidney disease with heart failure and stage 1 through stage 4 chronic kidney disease, or unspecified chronic kidney disease: Secondary | ICD-10-CM | POA: Diagnosis not present

## 2015-05-17 DIAGNOSIS — Z48812 Encounter for surgical aftercare following surgery on the circulatory system: Secondary | ICD-10-CM | POA: Diagnosis not present

## 2015-05-17 DIAGNOSIS — Z951 Presence of aortocoronary bypass graft: Secondary | ICD-10-CM | POA: Diagnosis not present

## 2015-05-17 DIAGNOSIS — Z602 Problems related to living alone: Secondary | ICD-10-CM | POA: Diagnosis not present

## 2015-05-17 DIAGNOSIS — Z6833 Body mass index (BMI) 33.0-33.9, adult: Secondary | ICD-10-CM | POA: Diagnosis not present

## 2015-05-17 DIAGNOSIS — N183 Chronic kidney disease, stage 3 (moderate): Secondary | ICD-10-CM | POA: Diagnosis not present

## 2015-05-17 DIAGNOSIS — Z7902 Long term (current) use of antithrombotics/antiplatelets: Secondary | ICD-10-CM | POA: Diagnosis not present

## 2015-05-17 DIAGNOSIS — E1122 Type 2 diabetes mellitus with diabetic chronic kidney disease: Secondary | ICD-10-CM | POA: Diagnosis not present

## 2015-05-17 DIAGNOSIS — Z794 Long term (current) use of insulin: Secondary | ICD-10-CM | POA: Diagnosis not present

## 2015-05-17 DIAGNOSIS — I5022 Chronic systolic (congestive) heart failure: Secondary | ICD-10-CM | POA: Diagnosis not present

## 2015-05-17 DIAGNOSIS — J45909 Unspecified asthma, uncomplicated: Secondary | ICD-10-CM | POA: Diagnosis not present

## 2015-05-17 DIAGNOSIS — E1129 Type 2 diabetes mellitus with other diabetic kidney complication: Secondary | ICD-10-CM | POA: Diagnosis not present

## 2015-05-17 DIAGNOSIS — E039 Hypothyroidism, unspecified: Secondary | ICD-10-CM | POA: Diagnosis not present

## 2015-05-17 DIAGNOSIS — I209 Angina pectoris, unspecified: Secondary | ICD-10-CM | POA: Diagnosis not present

## 2015-05-17 DIAGNOSIS — I252 Old myocardial infarction: Secondary | ICD-10-CM | POA: Diagnosis not present

## 2015-05-17 DIAGNOSIS — I2510 Atherosclerotic heart disease of native coronary artery without angina pectoris: Secondary | ICD-10-CM | POA: Diagnosis not present

## 2015-05-17 DIAGNOSIS — E785 Hyperlipidemia, unspecified: Secondary | ICD-10-CM | POA: Diagnosis not present

## 2015-05-18 DIAGNOSIS — Z794 Long term (current) use of insulin: Secondary | ICD-10-CM | POA: Diagnosis not present

## 2015-05-18 DIAGNOSIS — Z602 Problems related to living alone: Secondary | ICD-10-CM | POA: Diagnosis not present

## 2015-05-18 DIAGNOSIS — I5022 Chronic systolic (congestive) heart failure: Secondary | ICD-10-CM | POA: Diagnosis not present

## 2015-05-18 DIAGNOSIS — E785 Hyperlipidemia, unspecified: Secondary | ICD-10-CM | POA: Diagnosis not present

## 2015-05-18 DIAGNOSIS — I130 Hypertensive heart and chronic kidney disease with heart failure and stage 1 through stage 4 chronic kidney disease, or unspecified chronic kidney disease: Secondary | ICD-10-CM | POA: Diagnosis not present

## 2015-05-18 DIAGNOSIS — Z7902 Long term (current) use of antithrombotics/antiplatelets: Secondary | ICD-10-CM | POA: Diagnosis not present

## 2015-05-18 DIAGNOSIS — N183 Chronic kidney disease, stage 3 (moderate): Secondary | ICD-10-CM | POA: Diagnosis not present

## 2015-05-18 DIAGNOSIS — E1122 Type 2 diabetes mellitus with diabetic chronic kidney disease: Secondary | ICD-10-CM | POA: Diagnosis not present

## 2015-05-18 DIAGNOSIS — J45909 Unspecified asthma, uncomplicated: Secondary | ICD-10-CM | POA: Diagnosis not present

## 2015-05-18 DIAGNOSIS — Z48812 Encounter for surgical aftercare following surgery on the circulatory system: Secondary | ICD-10-CM | POA: Diagnosis not present

## 2015-05-18 DIAGNOSIS — Z7982 Long term (current) use of aspirin: Secondary | ICD-10-CM | POA: Diagnosis not present

## 2015-05-18 DIAGNOSIS — I2510 Atherosclerotic heart disease of native coronary artery without angina pectoris: Secondary | ICD-10-CM | POA: Diagnosis not present

## 2015-05-18 DIAGNOSIS — I252 Old myocardial infarction: Secondary | ICD-10-CM | POA: Diagnosis not present

## 2015-05-27 ENCOUNTER — Ambulatory Visit (INDEPENDENT_AMBULATORY_CARE_PROVIDER_SITE_OTHER): Payer: Medicare Other | Admitting: Endocrinology

## 2015-05-27 ENCOUNTER — Encounter: Payer: Self-pay | Admitting: Endocrinology

## 2015-05-27 VITALS — BP 132/87 | HR 78 | Temp 98.2°F | Ht 67.0 in | Wt 211.0 lb

## 2015-05-27 DIAGNOSIS — Z794 Long term (current) use of insulin: Secondary | ICD-10-CM

## 2015-05-27 DIAGNOSIS — N184 Chronic kidney disease, stage 4 (severe): Secondary | ICD-10-CM | POA: Diagnosis not present

## 2015-05-27 DIAGNOSIS — E1122 Type 2 diabetes mellitus with diabetic chronic kidney disease: Secondary | ICD-10-CM | POA: Diagnosis not present

## 2015-05-27 MED ORDER — INSULIN NPH ISOPHANE & REGULAR (70-30) 100 UNIT/ML ~~LOC~~ SUSP: 32.0000 [IU] | Freq: Every day | SUBCUTANEOUS | Status: DC

## 2015-05-27 NOTE — Progress Notes (Signed)
Subjective:    Patient ID: Carol [name], female    DOB: 09-07-1935, 79 y.o.   MRN: [id number]  HPI Pt returns for f/u of diabetes mellitus: DM type: Insulin-requiring type 2 Dx'ed: [id number] Complications: polyneuropathy, renal failure, retinopathy, and CAD. Therapy: insulin since MI in mid-2015.  GDM: never.   DKA: never Severe hypoglycemia: never.  Pancreatitis: never.   Other: she had hyperglycemia hyperosmolar state with one of her hospitalizations in 2015; She wants to take insulin only qd; she takes human insulin, due to cost; the pattern of pt's cbg's supports the need for a fast-acting qd insulin.  Interval history: She went back to [facility] insulin.  She recently had CABG.  no cbg record, but states cbg's vary from 70-200's.  It is in general higher as the day goes on.   Past Medical History  Diagnosis Date  . Hypertension   . Asthma   . Hyperlipidemia     a. H/o intolerance to Zocor, Lipitor. Had muscle aches on higher dose Crestor.  . Gout, unspecified   . CAD S/P percutaneous coronary angioplasty 01/22/2014    A. (12/2013): POBA - OM2 99% (very tortuous segment) - reduced ~ 50% & TIMI 3 flow, 80-90% stenosis in mid PDA, Irregularities <50% in mRCA, pLAD and prox LCx; b. 02/17/14: Moderate sized fixed defect in Lateral wall --> cath with OM2 restenosis & otherwise stable --> Xience Alpine DES x 2 (2.25 mm x 12 & 8 mm). c. NSTEMI 02/2014 s/p PTCA/balloon angioplasty only to small caliber PDA.  Marland Kitchen Chronic systolic heart failure ([place])     a. ICM b. ECHO (01/2014): EF 25-30%, grade I DD, trivial MR. c. EF by Myoview 02/17/14 = ~53%. d. EF by cath 02/24/14 - improved to 50-55%.  . Environmental allergies   . Type II diabetes mellitus ([place])   . GERD (gastroesophageal reflux disease)   . TIA (transient ischemic attack) 1990's    a. TIA vs stroke 1990's "mild" - sounds like a TIA as she was told she had stroke symptoms but negative scans. Denies residual effects.  . Contrast media allergy   .  QT prolongation     a. Noted on EKG 02/2014.  Marland Kitchen Anemia     a. Noted on labs 02/2014 possibly procedurally related.  Marland Kitchen LBBB (left bundle branch block)     a. Transient during 02/2014 admission.  . CKD (chronic kidney disease), stage III   . Sinus bradycardia     a. HR 40s-50s during 02/2014 admission, limiting BB dose.  . NSTEMI (non-ST elevated myocardial infarction) ([place]) 2014; 12/2013, 01/2014  . Hypothyroidism     Past Surgical History  Procedure Laterality Date  . Shoulder open rotator cuff repair Bilateral 1980's - 1990's  . Total knee arthroplasty Right 2009  . Cesarean section  [id number]; 1960  . Appendectomy  1959  . Cholecystectomy  1989  . Reduction mammaplasty Bilateral 1990's  . Cataract extraction w/ intraocular lens  implant, bilateral Bilateral 2013  . Coronary angioplasty  12/2013    POBA - OM2  . Percutaneous coronary stent intervention (pci-s)  02/17/14    For restenosis of OM2 - PCI with Xience Apline DES 2.25 mm x 12 mm & 2.25 mm x 8 mm overlapping  . Left heart catheterization with coronary angiogram N/A 01/22/2014    Procedure: LEFT HEART CATHETERIZATION WITH CORONARY ANGIOGRAM;  Surgeon: Sinclair Grooms, MD;  Location: [facility] CATH LAB;  Service: Cardiovascular;  Laterality: N/A;  . Left  heart catheterization with coronary angiogram N/A 02/17/2014    Procedure: LEFT HEART CATHETERIZATION WITH CORONARY ANGIOGRAM;  Surgeon: Burnell Blanks, MD;  Location: [facility] CATH LAB;  Service: Cardiovascular;  Laterality: N/A;  . Left heart catheterization with coronary angiogram N/A 02/24/2014    Procedure: LEFT HEART CATHETERIZATION WITH CORONARY ANGIOGRAM;  Surgeon: Burnell Blanks, MD;  Location: [facility] CATH LAB;  Service: Cardiovascular;  Laterality: N/A;  . Cardiac catheterization  02/24/2014    Procedure: CORONARY BALLOON ANGIOPLASTY;  Surgeon: Burnell Blanks, MD;  Location: [facility] CATH LAB;  Service: Cardiovascular;;  . Abdominal hysterectomy      complete  . Joint  replacement      right knee  . Lumbar laminectomy/decompression microdiscectomy Left 12/22/2014    Procedure: CENTRAL DECOMPRESSIVE LUMBAR, AND LAMINECTOMY L5-S1,  S1-S2 FOR SPINAL STENOSIS FORAMINOTOMY L5, S1, S2 ROOTS ON LEFT FOR FORAMINAL STENOSIS, AND DECOMPRESSION L5-S1, S1-S2 ACCORDING TO LABELING OF [place] X-RAYS;  Surgeon: Latanya Maudlin, MD;  Location: [facility] ORS;  Service: Orthopedics;  Laterality: Left;  . Cardiac catheterization N/A 04/20/2015    Procedure: Left Heart Cath and Coronary Angiography;  Surgeon: Belva Crome, MD;  Location: [facility] CV LAB;  Service: Cardiovascular;  Laterality: N/A;  . Coronary artery bypass graft N/A 04/25/2015    Procedure: CORONARY ARTERY BYPASS GRAFTING (CABG) x four,  using left internal mammary artery and right leg greater saphenous vein harvested endoscopically;  Surgeon: Gaye Pollack, MD;  Location: [place];  Service: Open Heart Surgery;  Laterality: N/A;  . Tee without cardioversion N/A 04/25/2015    Procedure: TRANSESOPHAGEAL ECHOCARDIOGRAM (TEE);  Surgeon: Gaye Pollack, MD;  Location: [place];  Service: Open Heart Surgery;  Laterality: N/A;    Social History   Social History  . Marital Status: Widowed    Spouse Name: N/A  . Number of Children: 2  . Years of Education: N/A   Occupational History  . Retired    Social History Main Topics  . Smoking status: Never Smoker   . Smokeless tobacco: Never Used  . Alcohol Use: No  . Drug Use: No  . Sexual Activity: No   Other Topics Concern  . Not on file   Social History Narrative   Lives in [place] by herself.     Current Outpatient Prescriptions on File Prior to Visit  Medication Sig Dispense Refill  . allopurinol (ZYLOPRIM) 300 MG tablet Take 300 mg by mouth every morning.     Marland Kitchen aspirin EC 81 MG EC tablet Take 1 tablet (81 mg total) by mouth daily. 30 tablet 3  . Biotin 5000 MCG CAPS Take 1 capsule by mouth every morning.     . carvedilol (COREG) 3.125 MG tablet  Take 1 tablet (3.125 mg total) by mouth 2 (two) times daily with a meal. 60 tablet 11  . clopidogrel (PLAVIX) 75 MG tablet TAKE 1 TABLET BY MOUTH ONCE DAILY. 30 tablet 3  . colchicine 0.6 MG tablet Take 0.6 mg by mouth daily as needed (gout flare up.).     Marland Kitchen famotidine (PEPCID AC) 10 MG tablet Take 1 tablet (10 mg total) by mouth 2 (two) times daily. (Patient taking differently: Take 10 mg by mouth 2 (two) times daily as needed for heartburn or indigestion. ) 60 tablet 6  . furosemide (LASIX) 40 MG tablet Take 40 mg by mouth daily.     . isosorbide mononitrate (IMDUR) 30 MG 24 hr tablet Take 1 tablet (30 mg total) by  mouth 2 (two) times daily. 60 tablet 6  . lisinopril (PRINIVIL,ZESTRIL) 2.5 MG tablet Take 1 tablet (2.5 mg total) by mouth daily. (Patient taking differently: Take 2.5 mg by mouth every morning. ) 30 tablet 0  . ONE TOUCH ULTRA TEST test strip 1 each by Other route as needed for other.     . oxyCODONE (OXY IR/ROXICODONE) 5 MG immediate release tablet Take 1-2 tablets (5-10 mg total) by mouth every 3 (three) hours as needed for severe pain. 30 tablet 0  . pantoprazole (PROTONIX) 40 MG tablet TAKE 1 TABLET BY MOUTH ONCE DAILY. 30 tablet 3  . PROAIR HFA 108 (90 BASE) MCG/ACT inhaler Inhale 1-2 puffs into the lungs every 4 (four) hours as needed for wheezing or shortness of breath. For breathing    . traMADol (ULTRAM) 50 MG tablet Take 1 tablet (50 mg total) by mouth daily as needed for moderate pain. 30 tablet 0  . levothyroxine (SYNTHROID, LEVOTHROID) 50 MCG tablet Take 50 mcg by mouth every morning.      No current facility-administered medications on file prior to visit.    Allergies  Allergen Reactions  . Contrast Media [Iodinated Diagnostic Agents] Other (See Comments)    unknown  . Cortizone-10 [Hydrocortisone] Other (See Comments)    unknown  . Lipitor [Atorvastatin]     Muscle aches- severe  . Zocor [Simvastatin]     Muscle aches- severe    Family History  Problem  Relation Age of Onset  . Hypertension Mother   . Diabetes Mother   . Heart attack Mother 52  . Heart disease Father   . Diabetes Brother   . Heart disease Brother   . Cancer Sister   . Emphysema Father   . Stroke Neg Hx     BP 132/87 mmHg  Pulse 78  Temp(Src) 98.2 F (36.8 C) (Oral)  Ht 5\' 7"  (1.702 m)  Wt 211 lb (95.709 kg)  BMI 33.04 kg/m2  SpO2 97%  Review of Systems No weight change    Objective:   Physical Exam VITAL SIGNS:  See vs page GENERAL: no distress Pulses: dorsalis pedis intact bilat.   MSK: no deformity of the feet  CV: trace bilat leg edema, and varicosities. Skin: no ulcer on the feet. normal color and temp on the feet.  Neuro: sensation is intact to touch on the feet.   Ext: There is bilateral onychomycosis of the toenails.    Lab Results  Component Value Date   HGBA1C 8.1* 04/25/2015      Assessment & Plan:  DM: therapy limited by noncompliance with cbg monitoring and insulin.   Patient is advised the following: Patient Instructions  check your blood sugar twice a day.  vary the time of day when you check, between before the 3 meals, and at bedtime.  also check if you have symptoms of your blood sugar being too high or too low.  please keep a record of the readings and bring it to your next appointment here.  You can write it on any piece of paper.  please call us sooner if your blood sugar goes below 70, or if you have a lot of readings over 200.   Please come back for a follow-up appointment in 2 months.   Please change the insulin to "70/30," 32 units with the first meal of the day.  However, if you are going to be active, take just 25 units.   Please call us next week, to tell  us how the blood sugar is.

## 2015-05-27 NOTE — Patient Instructions (Addendum)
check your blood sugar twice a day.  vary the time of day when you check, between before the 3 meals, and at bedtime.  also check if you have symptoms of your blood sugar being too high or too low.  please keep a record of the readings and bring it to your next appointment here.  You can write it on any piece of paper.  please call us sooner if your blood sugar goes below 70, or if you have a lot of readings over 200.   Please come back for a follow-up appointment in 2 months.   Please change the insulin to "70/30," 32 units with the first meal of the day.  However, if you are going to be active, take just 25 units.   Please call us next week, to tell us how the blood sugar is.

## 2015-05-31 ENCOUNTER — Other Ambulatory Visit: Payer: Self-pay | Admitting: *Deleted

## 2015-05-31 DIAGNOSIS — Z951 Presence of aortocoronary bypass graft: Secondary | ICD-10-CM

## 2015-06-01 ENCOUNTER — Encounter: Payer: Self-pay | Admitting: Surgery

## 2015-06-01 ENCOUNTER — Ambulatory Visit
Admission: RE | Admit: 2015-06-01 | Discharge: 2015-06-01 | Disposition: A | Discharge status: Home / Self Care | Payer: Medicare Other | Source: Ambulatory Visit | Attending: Surgery | Admitting: Surgery

## 2015-06-01 ENCOUNTER — Ambulatory Visit (INDEPENDENT_AMBULATORY_CARE_PROVIDER_SITE_OTHER): Payer: Self-pay | Admitting: Surgery

## 2015-06-01 VITALS — BP 137/80 | HR 80 | Resp 20 | Ht 67.0 in | Wt 211.0 lb

## 2015-06-01 DIAGNOSIS — Z951 Presence of aortocoronary bypass graft: Secondary | ICD-10-CM

## 2015-06-01 DIAGNOSIS — R0602 Shortness of breath: Secondary | ICD-10-CM | POA: Diagnosis not present

## 2015-06-01 NOTE — Progress Notes (Signed)
HPI: Patient returns for routine postoperative follow-up having undergone CABG x 4 on 04/25/2015. The patient's early postoperative recovery while in the hospital was notable for an uncomplicated postop course. Since hospital discharge the patient reports that she has been feeling well. She is walking without chest pain or shortness of breath and feels like her stamina is improving. She has had days where she feels " drunk" but does not drink ETOH.    Current Outpatient Prescriptions  Medication Sig Dispense Refill  . allopurinol (ZYLOPRIM) 300 MG tablet Take 300 mg by mouth every morning.     Marland Kitchen aspirin EC 81 MG EC tablet Take 1 tablet (81 mg total) by mouth daily. 30 tablet 3  . Biotin 5000 MCG CAPS Take 1 capsule by mouth every morning.     . carvedilol (COREG) 3.125 MG tablet Take 1 tablet (3.125 mg total) by mouth 2 (two) times daily with a meal. 60 tablet 11  . clopidogrel (PLAVIX) 75 MG tablet TAKE 1 TABLET BY MOUTH ONCE DAILY. 30 tablet 3  . colchicine 0.6 MG tablet Take 0.6 mg by mouth daily as needed (gout flare up.).     Marland Kitchen famotidine (PEPCID AC) 10 MG tablet Take 1 tablet (10 mg total) by mouth 2 (two) times daily. (Patient taking differently: Take 10 mg by mouth 2 (two) times daily as needed for heartburn or indigestion. ) 60 tablet 6  . furosemide (LASIX) 40 MG tablet Take 40 mg by mouth daily.     . insulin NPH-regular Human (HUMULIN 70/30) (70-30) 100 UNIT/ML injection Inject 32 Units into the skin daily with breakfast. And syringes 1/day 10 mL 11  . isosorbide mononitrate (IMDUR) 30 MG 24 hr tablet Take 1 tablet (30 mg total) by mouth 2 (two) times daily. 60 tablet 6  . levothyroxine (SYNTHROID, LEVOTHROID) 50 MCG tablet Take 50 mcg by mouth every morning.     Marland Kitchen lisinopril (PRINIVIL,ZESTRIL) 2.5 MG tablet Take 1 tablet (2.5 mg total) by mouth daily. (Patient taking differently: Take 2.5 mg by mouth every morning. ) 30 tablet 0  . ONE TOUCH ULTRA TEST test strip 1 each by  Other route as needed for other.     . oxyCODONE (OXY IR/ROXICODONE) 5 MG immediate release tablet Take 1-2 tablets (5-10 mg total) by mouth every 3 (three) hours as needed for severe pain. 30 tablet 0  . pantoprazole (PROTONIX) 40 MG tablet TAKE 1 TABLET BY MOUTH ONCE DAILY. 30 tablet 3  . PROAIR HFA 108 (90 BASE) MCG/ACT inhaler Inhale 1-2 puffs into the lungs every 4 (four) hours as needed for wheezing or shortness of breath. For breathing    . traMADol (ULTRAM) 50 MG tablet Take 1 tablet (50 mg total) by mouth daily as needed for moderate pain. 30 tablet 0   No current facility-administered medications for this visit.    Physical Exam: BP 137/80 mmHg  Pulse 80  Resp 20  Ht 5\' 7"  (1.702 m)  Wt 211 lb (95.709 kg)  BMI 33.04 kg/m2  SpO2 96% She looks well. Lung exam is clear. Cardiac exam shows a regular rate and rhythm with normal heart sounds. Chest incision is healing well and sternum is stable. The leg incisions are healing well and there is no peripheral edema.    Diagnostic Tests:   CLINICAL DATA: Status post coronary bypass graft with persistent shortness of breath and mild chest pain  EXAM: CHEST - 2 VIEW  COMPARISON: 04/27/2015  FINDINGS: Cardiac  shadow is within normal limits. Postoperative changes are again seen. The lungs are well aerated bilaterally. No sizable effusion or infiltrate is seen. Mild degenerative changes of the thoracic spine are noted.  IMPRESSION: No acute abnormality noted.   Electronically Signed  By: Inez Catalina M.D.  On: 06/01/2015 09:33      Impression:  Overall I think she is doing well. I encouraged her to continue walking.  I told her that she could drive her car but should not lift anything heavier than 10 lbs for three months postop. She has still been taking Imdur bid since discharge and I think she could stop that after CABG.    Plan:  She will continue to follow up with Dr. Angelena Form for her cardiology  care and will see him in a couple months. She will contact me if she has any problems with her incisions.    Gaye Pollack, MD [facility] [phone]

## 2015-06-02 DIAGNOSIS — I5022 Chronic systolic (congestive) heart failure: Secondary | ICD-10-CM | POA: Diagnosis not present

## 2015-06-02 DIAGNOSIS — Z48812 Encounter for surgical aftercare following surgery on the circulatory system: Secondary | ICD-10-CM | POA: Diagnosis not present

## 2015-06-03 ENCOUNTER — Other Ambulatory Visit: Payer: Self-pay | Admitting: Cardiovascular Disease

## 2015-06-07 DIAGNOSIS — I10 Essential (primary) hypertension: Secondary | ICD-10-CM | POA: Diagnosis not present

## 2015-06-07 DIAGNOSIS — N183 Chronic kidney disease, stage 3 (moderate): Secondary | ICD-10-CM | POA: Diagnosis not present

## 2015-06-07 DIAGNOSIS — E039 Hypothyroidism, unspecified: Secondary | ICD-10-CM | POA: Diagnosis not present

## 2015-06-07 DIAGNOSIS — E785 Hyperlipidemia, unspecified: Secondary | ICD-10-CM | POA: Diagnosis not present

## 2015-06-07 DIAGNOSIS — E1129 Type 2 diabetes mellitus with other diabetic kidney complication: Secondary | ICD-10-CM | POA: Diagnosis not present

## 2015-06-07 DIAGNOSIS — I5022 Chronic systolic (congestive) heart failure: Secondary | ICD-10-CM | POA: Diagnosis not present

## 2015-06-07 DIAGNOSIS — N390 Urinary tract infection, site not specified: Secondary | ICD-10-CM | POA: Diagnosis not present

## 2015-06-22 DIAGNOSIS — Z6833 Body mass index (BMI) 33.0-33.9, adult: Secondary | ICD-10-CM | POA: Diagnosis not present

## 2015-06-22 DIAGNOSIS — T7840XA Allergy, unspecified, initial encounter: Secondary | ICD-10-CM | POA: Diagnosis not present

## 2015-07-20 NOTE — Progress Notes (Signed)
Chief Complaint  Patient presents with  . Follow-up    pt has no complaints  . Coronary Artery Disease  . Hypertension     History of Present Illness: 80 yo female with history of CAD, DM, HTN, asthma, hyperthyroidism, HLD who is here today for hospital follow up. I saw her June 2014 as a new patient for evaluation of chest pain. She had a normal stress test and echo in June 2014. She was admitted to [facility] 01/22/14 with NSTEMI. She underwent cardiac cath per Dr. Tamala Julian and he performed balloon angioplasty only of a moderate caliber, tortuous OM2 branch. Severe disease in small PDA was managed medically. LVEF was 25-30% by echo. She was in heart failure and was diuresed. She was readmitted 02/01/14 with weakness but no chest pain. Readmitted 02/15/14 with chest pain.  Troponin was negative but stress myoview with lateral wall defect c/w ischemia. Cardiac cath 02/17/14 and I placed 2 overlapping DES in the second OM branch. I elected to continue to treat the PDA branch medically given small caliber of vessel. She was readmitted 02/24/14 with recurrent chest pain and mildly elevated troponin. Cardiac cath 02/24/14 with patent stents OM2, severe disease PDA. I performed balloon angioplasty on the PDA branch. The vessel was too small for a stent. Also noted that the OM2 branch appears to have a bridging segment with systole just before the stent. LVEF=50-55% by LV gram. Echo September 2016 with LVEF=60%. She was admitted to [facility] from the office 04/19/15 with unstable angina. Troponin was negative. Cardiac catheterization demonstrated probable mid to distal left main disease, patent OM1 stents, mid RCA 50-70% stenosis and 50% stenosis of the LV branch at site of previous PTCA. Chest CT was obtained to further examine the left main stenosis. This demonstrated significant mixed plaque with lucent core suggesting high risk feature in the LM felt to be 50-75%. Patient was seen by Dr. Cyndia Bent and underwent CABG 04/25/15 with  a LIMA-LAD, SVG-OM2/OM3, SVG-RCA. Postoperative course was uneventful.. She does not tolerate statins.   She is here today for follow up.  No chest pain or SOB.   Energy level is good. She is active again.   Primary Care Physician: Reinaldo Meeker  Fasting lipid profile:  Followed in primary care  Past Medical History  Diagnosis Date  . Hypertension   . Asthma   . Hyperlipidemia     a. H/o intolerance to Zocor, Lipitor. Had muscle aches on higher dose Crestor.  . Gout, unspecified   . CAD S/P percutaneous coronary angioplasty 01/22/2014    A. (12/2013): POBA - OM2 99% (very tortuous segment) - reduced ~ 50% & TIMI 3 flow, 80-90% stenosis in mid PDA, Irregularities <50% in mRCA, pLAD and prox LCx; b. 02/17/14: Moderate sized fixed defect in Lateral wall --> cath with OM2 restenosis & otherwise stable --> Xience Alpine DES x 2 (2.25 mm x 12 & 8 mm). c. NSTEMI 02/2014 s/p PTCA/balloon angioplasty only to small caliber PDA.  Marland Kitchen Chronic systolic heart failure ([place])     a. ICM b. ECHO (01/2014): EF 25-30%, grade I DD, trivial MR. c. EF by Myoview 02/17/14 = ~53%. d. EF by cath 02/24/14 - improved to 50-55%.  . Environmental allergies   . Type II diabetes mellitus ([place])   . GERD (gastroesophageal reflux disease)   . TIA (transient ischemic attack) 1990's    a. TIA vs stroke 1990's "mild" - sounds like a TIA as she was told she had stroke symptoms but  negative scans. Denies residual effects.  . Contrast media allergy   . QT prolongation     a. Noted on EKG 02/2014.  Marland Kitchen Anemia     a. Noted on labs 02/2014 possibly procedurally related.  Marland Kitchen LBBB (left bundle branch block)     a. Transient during 02/2014 admission.  . CKD (chronic kidney disease), stage III   . Sinus bradycardia     a. HR 40s-50s during 02/2014 admission, limiting BB dose.  . NSTEMI (non-ST elevated myocardial infarction) ([place]) 2014; 12/2013, 01/2014  . Hypothyroidism     Past Surgical History  Procedure Laterality Date  . Shoulder open  rotator cuff repair Bilateral 1980's - 1990's  . Total knee arthroplasty Right 2009  . Cesarean section  [id number]; 1960  . Appendectomy  1959  . Cholecystectomy  1989  . Reduction mammaplasty Bilateral 1990's  . Cataract extraction w/ intraocular lens  implant, bilateral Bilateral 2013  . Coronary angioplasty  12/2013    POBA - OM2  . Percutaneous coronary stent intervention (pci-s)  02/17/14    For restenosis of OM2 - PCI with Xience Apline DES 2.25 mm x 12 mm & 2.25 mm x 8 mm overlapping  . Left heart catheterization with coronary angiogram N/A 01/22/2014    Procedure: LEFT HEART CATHETERIZATION WITH CORONARY ANGIOGRAM;  Surgeon: Sinclair Grooms, MD;  Location: [facility] CATH LAB;  Service: Cardiovascular;  Laterality: N/A;  . Left heart catheterization with coronary angiogram N/A 02/17/2014    Procedure: LEFT HEART CATHETERIZATION WITH CORONARY ANGIOGRAM;  Surgeon: Burnell Blanks, MD;  Location: [facility] CATH LAB;  Service: Cardiovascular;  Laterality: N/A;  . Left heart catheterization with coronary angiogram N/A 02/24/2014    Procedure: LEFT HEART CATHETERIZATION WITH CORONARY ANGIOGRAM;  Surgeon: Burnell Blanks, MD;  Location: [facility] CATH LAB;  Service: Cardiovascular;  Laterality: N/A;  . Cardiac catheterization  02/24/2014    Procedure: CORONARY BALLOON ANGIOPLASTY;  Surgeon: Burnell Blanks, MD;  Location: [facility] CATH LAB;  Service: Cardiovascular;;  . Abdominal hysterectomy      complete  . Joint replacement      right knee  . Lumbar laminectomy/decompression microdiscectomy Left 12/22/2014    Procedure: CENTRAL DECOMPRESSIVE LUMBAR, AND LAMINECTOMY L5-S1,  S1-S2 FOR SPINAL STENOSIS FORAMINOTOMY L5, S1, S2 ROOTS ON LEFT FOR FORAMINAL STENOSIS, AND DECOMPRESSION L5-S1, S1-S2 ACCORDING TO LABELING OF [place] X-RAYS;  Surgeon: Latanya Maudlin, MD;  Location: [facility] ORS;  Service: Orthopedics;  Laterality: Left;  . Cardiac catheterization N/A 04/20/2015    Procedure: Left Heart Cath  and Coronary Angiography;  Surgeon: Belva Crome, MD;  Location: [place] CV LAB;  Service: Cardiovascular;  Laterality: N/A;  . Coronary artery bypass graft N/A 04/25/2015    Procedure: CORONARY ARTERY BYPASS GRAFTING (CABG) x four,  using left internal mammary artery and right leg greater saphenous vein harvested endoscopically;  Surgeon: Gaye Pollack, MD;  Location: [place];  Service: Open Heart Surgery;  Laterality: N/A;  . Tee without cardioversion N/A 04/25/2015    Procedure: TRANSESOPHAGEAL ECHOCARDIOGRAM (TEE);  Surgeon: Gaye Pollack, MD;  Location: [place];  Service: Open Heart Surgery;  Laterality: N/A;    Current Outpatient Prescriptions  Medication Sig Dispense Refill  . allopurinol (ZYLOPRIM) 300 MG tablet Take 300 mg by mouth every morning.     Marland Kitchen aspirin EC 81 MG EC tablet Take 1 tablet (81 mg total) by mouth daily. 30 tablet 3  . Biotin 5000 MCG CAPS Take 1 capsule  by mouth every morning.     . carvedilol (COREG) 3.125 MG tablet Take 1 tablet (3.125 mg total) by mouth 2 (two) times daily with a meal. 60 tablet 11  . clopidogrel (PLAVIX) 75 MG tablet TAKE 1 TABLET BY MOUTH ONCE DAILY. 30 tablet 3  . colchicine 0.6 MG tablet Take 0.6 mg by mouth daily as needed (gout flare up.).     Marland Kitchen famotidine (PEPCID) 10 MG tablet Take 10 mg by mouth 2 (two) times daily.    . furosemide (LASIX) 40 MG tablet TAKE 1 TABLET BY MOUTH ONCE DAILY 30 tablet 5  . insulin NPH-regular Human (HUMULIN 70/30) (70-30) 100 UNIT/ML injection Inject 32 Units into the skin daily with breakfast. And syringes 1/day 10 mL 11  . levothyroxine (SYNTHROID, LEVOTHROID) 50 MCG tablet Take 50 mcg by mouth every morning.     Marland Kitchen lisinopril (PRINIVIL,ZESTRIL) 2.5 MG tablet Take 1 tablet (2.5 mg total) by mouth daily. (Patient taking differently: Take 2.5 mg by mouth every morning. ) 30 tablet 0  . ONE TOUCH ULTRA TEST test strip 1 each by Other route as needed for other.     . oxyCODONE (OXY IR/ROXICODONE) 5 MG immediate  release tablet Take 1-2 tablets (5-10 mg total) by mouth every 3 (three) hours as needed for severe pain. 30 tablet 0  . pantoprazole (PROTONIX) 40 MG tablet TAKE 1 TABLET BY MOUTH ONCE DAILY. 30 tablet 3  . PROAIR HFA 108 (90 BASE) MCG/ACT inhaler Inhale 1-2 puffs into the lungs every 4 (four) hours as needed for wheezing or shortness of breath. For breathing    . traMADol (ULTRAM) 50 MG tablet Take 1 tablet (50 mg total) by mouth daily as needed for moderate pain. 30 tablet 0   No current facility-administered medications for this visit.    Allergies  Allergen Reactions  . Contrast Media [Iodinated Diagnostic Agents] Other (See Comments)    unknown  . Cortizone-10 [Hydrocortisone] Other (See Comments)    unknown  . Lipitor [Atorvastatin]     Muscle aches- severe  . Zocor [Simvastatin]     Muscle aches- severe    Social History   Social History  . Marital Status: Widowed    Spouse Name: N/A  . Number of Children: 2  . Years of Education: N/A   Occupational History  . Retired    Social History Main Topics  . Smoking status: Never Smoker   . Smokeless tobacco: Never Used  . Alcohol Use: No  . Drug Use: No  . Sexual Activity: No   Other Topics Concern  . Not on file   Social History Narrative   Lives in [place] by herself.     Family History  Problem Relation Age of Onset  . Hypertension Mother   . Diabetes Mother   . Heart attack Mother 12  . Heart disease Father   . Diabetes Brother   . Heart disease Brother   . Cancer Sister   . Emphysema Father   . Stroke Neg Hx     Review of Systems:  As stated in the HPI and otherwise negative.   BP 110/58 mmHg  Pulse 71  Ht 5\' 7"  (1.702 m)  Wt 208 lb 1.9 oz (94.403 kg)  BMI 32.59 kg/m2  SpO2 97%  Physical Examination: General: Well developed, well nourished, NAD HEENT: OP clear, mucus membranes moist SKIN: warm, dry. No rashes. Neuro: No focal deficits Musculoskeletal: Muscle strength 5/5 all  ext Psychiatric: Mood and affect  normal Neck: No JVD, no carotid bruits, no thyromegaly, no lymphadenopathy. Lungs:Clear bilaterally, no wheezes, rhonci, crackles Cardiovascular: Regular rate and rhythm. No murmurs, gallops or rubs. Abdomen:Soft. Bowel sounds present. Non-tender.  Extremities: No lower extremity edema. Pulses are 2 + in the bilateral DP/PT.  Echo 03/04/15: Left ventricle: The cavity size was normal. Wall thickness was normal. Systolic function was normal. The estimated ejection fraction was in the range of 60% to 65%. Doppler parameters are consistent with abnormal left ventricular relaxation (grade 1 diastolic dysfunction).  Cardiac cath 04/20/15: 1. Mid RCA to Dist RCA lesion, 70% stenosed. 2. 1st RPLB lesion, 70% stenosed. 3. 1st Mrg-1 lesion, 50% stenosed. 4. Dist Cx lesion, 40% stenosed. 5. Prox LAD to Dist LAD lesion, 25% stenosed. 6. LM lesion, 50% stenosed.   Probable mid to distal left main disease. Rule out contrast streaming.  Patent first obtuse marginal stents placed one year ago. Irregularities noted in the mid circumflex.  New mid RCA 50-70% stenosis. 50% left ventricular branch at the site of previous PTCA.  Overall normal left ventricular function. Ejection fraction 50%.   EKG:  EKG is not ordered today. The ekg ordered today demonstrates   Recent Labs: 04/19/2015: ALT 15; B Natriuretic Peptide 36.6; TSH 7.021* 04/26/2015: Magnesium 2.5* 04/27/2015: BUN 15; Creatinine, Ser 1.00; Potassium 3.8; Sodium 138 04/28/2015: Hemoglobin 9.7*; Platelets 136*   Lipid Panel    Component Value Date/Time   CHOL 176 07/26/2014 1115   TRIG 195.0* 07/26/2014 1115   HDL 42.90 07/26/2014 1115   CHOLHDL 4 07/26/2014 1115   VLDL 39.0 07/26/2014 1115   LDLCALC 94 07/26/2014 1115     Wt Readings from Last 3 Encounters:  07/21/15 208 lb 1.9 oz (94.403 kg)  06/01/15 211 lb (95.709 kg)  05/27/15 211 lb (95.709 kg)     Other studies  Reviewed: Additional studies/ records that were reviewed today include: Labs from primary care including lipids Review of the above records demonstrates:    Assessment and Plan:   1. CAD:  S/p CABG. Stable. No recent chest pains. She will continue ASA, Plavix, beta blocker, Ace-inh.   2. HTN: BP well controlled. No changes  3. DM with vascular complications (CAD): Followed in primary care.   4. HLD: Followed in primary care. She does not tolerate statins. Can consider PCSK9 inh in the future.   5. Chronic systolic CHF: Volume status stable on Lasix 40 mg daily.   6. Cardiomyopathy: LVEF normal by echo September 2016.    Current medicines are reviewed at length with the patient today.  The patient does not have concerns regarding medicines.  The following changes have been made:    Labs/ tests ordered today include:   No orders of the defined types were placed in this encounter.    Disposition:   FU with me in 6 months  Signed, Lauree Chandler, MD 07/21/2015 1:25 PM    [facility] [address], [place], [place]  [address] Phone: [phone]; Fax: [phone]

## 2015-07-21 ENCOUNTER — Ambulatory Visit (INDEPENDENT_AMBULATORY_CARE_PROVIDER_SITE_OTHER): Payer: Medicare Other | Admitting: Cardiovascular Disease

## 2015-07-21 ENCOUNTER — Encounter: Payer: Self-pay | Admitting: Cardiovascular Disease

## 2015-07-21 VITALS — BP 110/58 | HR 71 | Ht 67.0 in | Wt 208.1 lb

## 2015-07-21 DIAGNOSIS — I5022 Chronic systolic (congestive) heart failure: Secondary | ICD-10-CM | POA: Diagnosis not present

## 2015-07-21 DIAGNOSIS — I2510 Atherosclerotic heart disease of native coronary artery without angina pectoris: Secondary | ICD-10-CM | POA: Diagnosis not present

## 2015-07-21 DIAGNOSIS — E785 Hyperlipidemia, unspecified: Secondary | ICD-10-CM

## 2015-07-21 DIAGNOSIS — I10 Essential (primary) hypertension: Secondary | ICD-10-CM | POA: Diagnosis not present

## 2015-07-21 NOTE — Patient Instructions (Signed)

## 2015-07-22 ENCOUNTER — Other Ambulatory Visit: Payer: Self-pay | Admitting: Cardiovascular Disease

## 2015-07-28 ENCOUNTER — Encounter: Payer: Self-pay | Admitting: Endocrinology

## 2015-07-28 ENCOUNTER — Ambulatory Visit (INDEPENDENT_AMBULATORY_CARE_PROVIDER_SITE_OTHER): Payer: Medicare Other | Admitting: Endocrinology

## 2015-07-28 VITALS — BP 138/80 | HR 73 | Temp 97.9°F | Ht 67.0 in | Wt 212.0 lb

## 2015-07-28 DIAGNOSIS — E1122 Type 2 diabetes mellitus with diabetic chronic kidney disease: Secondary | ICD-10-CM

## 2015-07-28 DIAGNOSIS — Z794 Long term (current) use of insulin: Secondary | ICD-10-CM

## 2015-07-28 DIAGNOSIS — N184 Chronic kidney disease, stage 4 (severe): Secondary | ICD-10-CM | POA: Diagnosis not present

## 2015-07-28 LAB — POCT GLYCOSYLATED HEMOGLOBIN (HGB A1C): Hemoglobin A1C: 7.9

## 2015-07-28 NOTE — Progress Notes (Signed)
Subjective:    Patient ID: Carol [name], female    DOB: May 01, 1936, 80 y.o.   MRN: [id number]  HPI Pt returns for f/u of diabetes mellitus: DM type: Insulin-requiring type 2 Dx'ed: [id number] Complications: polyneuropathy, renal failure, retinopathy, and CAD. Therapy: insulin since MI in mid-2015.  GDM: never.   DKA: never Severe hypoglycemia: never.  Pancreatitis: never.   Other: she had hyperglycemia hyperosmolar state with one of her hospitalizations in 2015; She wants to take insulin only qd; she takes human insulin, due to cost; the pattern of pt's cbg's supports the need for a faster-acting qd insulin.  Interval history: due to a renewed dietary effort, she has had to reduce her insulin, but her weight is unchanged.  She is soon to start a different type of diet, though.  She takes 70/30 insulin, 32 units qam.  she brings a record of her cbg's which i have reviewed today.  It varies from 45-200's.  It is lowest after lunch is messed or delayed.   Past Medical History  Diagnosis Date  . Hypertension   . Asthma   . Hyperlipidemia     a. H/o intolerance to Zocor, Lipitor. Had muscle aches on higher dose Crestor.  . Gout, unspecified   . CAD S/P percutaneous coronary angioplasty 01/22/2014    A. (12/2013): POBA - OM2 99% (very tortuous segment) - reduced ~ 50% & TIMI 3 flow, 80-90% stenosis in mid PDA, Irregularities <50% in mRCA, pLAD and prox LCx; b. 02/17/14: Moderate sized fixed defect in Lateral wall --> cath with OM2 restenosis & otherwise stable --> Xience Alpine DES x 2 (2.25 mm x 12 & 8 mm). c. NSTEMI 02/2014 s/p PTCA/balloon angioplasty only to small caliber PDA.  Marland Kitchen Chronic systolic heart failure ([place])     a. ICM b. ECHO (01/2014): EF 25-30%, grade I DD, trivial MR. c. EF by Myoview 02/17/14 = ~53%. d. EF by cath 02/24/14 - improved to 50-55%.  . Environmental allergies   . Type II diabetes mellitus ([place])   . GERD (gastroesophageal reflux disease)   . TIA (transient ischemic attack)  1990's    a. TIA vs stroke 1990's "mild" - sounds like a TIA as she was told she had stroke symptoms but negative scans. Denies residual effects.  . Contrast media allergy   . QT prolongation     a. Noted on EKG 02/2014.  Marland Kitchen Anemia     a. Noted on labs 02/2014 possibly procedurally related.  Marland Kitchen LBBB (left bundle branch block)     a. Transient during 02/2014 admission.  . CKD (chronic kidney disease), stage III   . Sinus bradycardia     a. HR 40s-50s during 02/2014 admission, limiting BB dose.  . NSTEMI (non-ST elevated myocardial infarction) ([place]) 2014; 12/2013, 01/2014  . Hypothyroidism     Past Surgical History  Procedure Laterality Date  . Shoulder open rotator cuff repair Bilateral 1980's - 1990's  . Total knee arthroplasty Right 2009  . Cesarean section  [id number]; 1960  . Appendectomy  1959  . Cholecystectomy  1989  . Reduction mammaplasty Bilateral 1990's  . Cataract extraction w/ intraocular lens  implant, bilateral Bilateral 2013  . Coronary angioplasty  12/2013    POBA - OM2  . Percutaneous coronary stent intervention (pci-s)  02/17/14    For restenosis of OM2 - PCI with Xience Apline DES 2.25 mm x 12 mm & 2.25 mm x 8 mm overlapping  . Left heart catheterization with coronary  angiogram N/A 01/22/2014    Procedure: LEFT HEART CATHETERIZATION WITH CORONARY ANGIOGRAM;  Surgeon: Sinclair Grooms, MD;  Location: [facility] CATH LAB;  Service: Cardiovascular;  Laterality: N/A;  . Left heart catheterization with coronary angiogram N/A 02/17/2014    Procedure: LEFT HEART CATHETERIZATION WITH CORONARY ANGIOGRAM;  Surgeon: Burnell Blanks, MD;  Location: [facility] CATH LAB;  Service: Cardiovascular;  Laterality: N/A;  . Left heart catheterization with coronary angiogram N/A 02/24/2014    Procedure: LEFT HEART CATHETERIZATION WITH CORONARY ANGIOGRAM;  Surgeon: Burnell Blanks, MD;  Location: [facility] CATH LAB;  Service: Cardiovascular;  Laterality: N/A;  . Cardiac catheterization  02/24/2014    Procedure:  CORONARY BALLOON ANGIOPLASTY;  Surgeon: Burnell Blanks, MD;  Location: [facility] CATH LAB;  Service: Cardiovascular;;  . Abdominal hysterectomy      complete  . Joint replacement      right knee  . Lumbar laminectomy/decompression microdiscectomy Left 12/22/2014    Procedure: CENTRAL DECOMPRESSIVE LUMBAR, AND LAMINECTOMY L5-S1,  S1-S2 FOR SPINAL STENOSIS FORAMINOTOMY L5, S1, S2 ROOTS ON LEFT FOR FORAMINAL STENOSIS, AND DECOMPRESSION L5-S1, S1-S2 ACCORDING TO LABELING OF [place] X-RAYS;  Surgeon: Latanya Maudlin, MD;  Location: [facility] ORS;  Service: Orthopedics;  Laterality: Left;  . Cardiac catheterization N/A 04/20/2015    Procedure: Left Heart Cath and Coronary Angiography;  Surgeon: Belva Crome, MD;  Location: [place] CV LAB;  Service: Cardiovascular;  Laterality: N/A;  . Coronary artery bypass graft N/A 04/25/2015    Procedure: CORONARY ARTERY BYPASS GRAFTING (CABG) x four,  using left internal mammary artery and right leg greater saphenous vein harvested endoscopically;  Surgeon: Gaye Pollack, MD;  Location: [place];  Service: Open Heart Surgery;  Laterality: N/A;  . Tee without cardioversion N/A 04/25/2015    Procedure: TRANSESOPHAGEAL ECHOCARDIOGRAM (TEE);  Surgeon: Gaye Pollack, MD;  Location: [place];  Service: Open Heart Surgery;  Laterality: N/A;    Social History   Social History  . Marital Status: Widowed    Spouse Name: N/A  . Number of Children: 2  . Years of Education: N/A   Occupational History  . Retired    Social History Main Topics  . Smoking status: Never Smoker   . Smokeless tobacco: Never Used  . Alcohol Use: No  . Drug Use: No  . Sexual Activity: No   Other Topics Concern  . Not on file   Social History Narrative   Lives in [place] by herself.     Current Outpatient Prescriptions on File Prior to Visit  Medication Sig Dispense Refill  . allopurinol (ZYLOPRIM) 300 MG tablet Take 300 mg by mouth every morning.     Marland Kitchen aspirin EC 81 MG EC  tablet Take 1 tablet (81 mg total) by mouth daily. 30 tablet 3  . Biotin 5000 MCG CAPS Take 1 capsule by mouth every morning.     . carvedilol (COREG) 3.125 MG tablet TAKE 1 TABLET BY MOUTH TWICE DAILY WITH MEALS 60 tablet 6  . clopidogrel (PLAVIX) 75 MG tablet TAKE 1 TABLET BY MOUTH ONCE DAILY. 30 tablet 3  . colchicine 0.6 MG tablet Take 0.6 mg by mouth daily as needed (gout flare up.).     Marland Kitchen famotidine (PEPCID) 10 MG tablet Take 10 mg by mouth 2 (two) times daily.    . furosemide (LASIX) 40 MG tablet TAKE 1 TABLET BY MOUTH ONCE DAILY 30 tablet 5  . insulin NPH-regular Human (HUMULIN 70/30) (70-30) 100 UNIT/ML injection Inject  32 Units into the skin daily with breakfast. And syringes 1/day 10 mL 11  . levothyroxine (SYNTHROID, LEVOTHROID) 50 MCG tablet Take 50 mcg by mouth every morning.     Marland Kitchen lisinopril (PRINIVIL,ZESTRIL) 2.5 MG tablet Take 1 tablet (2.5 mg total) by mouth daily. (Patient taking differently: Take 2.5 mg by mouth every morning. ) 30 tablet 0  . ONE TOUCH ULTRA TEST test strip 1 each by Other route as needed for other.     . oxyCODONE (OXY IR/ROXICODONE) 5 MG immediate release tablet Take 1-2 tablets (5-10 mg total) by mouth every 3 (three) hours as needed for severe pain. 30 tablet 0  . pantoprazole (PROTONIX) 40 MG tablet TAKE 1 TABLET BY MOUTH ONCE DAILY. 30 tablet 3  . PROAIR HFA 108 (90 BASE) MCG/ACT inhaler Inhale 1-2 puffs into the lungs every 4 (four) hours as needed for wheezing or shortness of breath. For breathing    . traMADol (ULTRAM) 50 MG tablet Take 1 tablet (50 mg total) by mouth daily as needed for moderate pain. 30 tablet 0   No current facility-administered medications on file prior to visit.    Allergies  Allergen Reactions  . Contrast Media [Iodinated Diagnostic Agents] Other (See Comments)    unknown  . Cortizone-10 [Hydrocortisone] Other (See Comments)    unknown  . Lipitor [Atorvastatin]     Muscle aches- severe  . Zocor [Simvastatin]     Muscle  aches- severe    Family History  Problem Relation Age of Onset  . Hypertension Mother   . Diabetes Mother   . Heart attack Mother 49  . Heart disease Father   . Diabetes Brother   . Heart disease Brother   . Cancer Sister   . Emphysema Father   . Stroke Neg Hx     BP 138/80 mmHg  Pulse 73  Temp(Src) 97.9 F (36.6 C) (Oral)  Ht 5\' 7"  (1.702 m)  Wt 212 lb (96.163 kg)  BMI 33.20 kg/m2  SpO2 98%  Review of Systems Denies LOC    Objective:   Physical Exam VITAL SIGNS:  See vs page GENERAL: no distress Pulses: dorsalis pedis intact bilat.   MSK: no deformity of the feet  CV: trace bilat leg edema, and varicosities. Skin: no ulcer on the feet. normal color and temp on the feet.  Neuro: sensation is intact to touch on the feet.   Ext: There is bilateral onychomycosis of the toenails.   A1c=7.9%    Assessment & Plan:  DM: this is the best control this pt should aim for, given this regimen, which does match insulin to her changing needs throughout the day.    Patient is advised the following: check your blood sugar twice a day.  vary the time of day when you check, between before the 3 meals, and at bedtime.  also check if you have symptoms of your blood sugar being too high or too low.  please keep a record of the readings and bring it to your next appointment here.  You can write it on any piece of paper.  please call us sooner if your blood sugar goes below 70, or if you have a lot of readings over 200.   Please come back for a follow-up appointment in 3 months.  Please continue the same insulin

## 2015-07-28 NOTE — Patient Instructions (Signed)
check your blood sugar twice a day.  vary the time of day when you check, between before the 3 meals, and at bedtime.  also check if you have symptoms of your blood sugar being too high or too low.  please keep a record of the readings and bring it to your next appointment here.  You can write it on any piece of paper.  please call us sooner if your blood sugar goes below 70, or if you have a lot of readings over 200.   Please come back for a follow-up appointment in 3 months.   Please change the insulin to "70/30," 32 units with the first meal of the day.  However, if you are going to be active, take just 25 units.   On this type of insulin schedule, you should eat meals on a regular schedule.  If a meal is missed or significantly delayed, your blood sugar could go low.

## 2015-08-12 ENCOUNTER — Other Ambulatory Visit: Payer: Self-pay | Admitting: Cardiovascular Disease

## 2015-08-24 ENCOUNTER — Other Ambulatory Visit: Payer: Self-pay | Admitting: Cardiovascular Disease

## 2015-08-29 DIAGNOSIS — E113393 Type 2 diabetes mellitus with moderate nonproliferative diabetic retinopathy without macular edema, bilateral: Secondary | ICD-10-CM | POA: Diagnosis not present

## 2015-08-30 ENCOUNTER — Ambulatory Visit: Payer: Medicare Other | Admitting: Nurse Practitioner

## 2015-09-06 ENCOUNTER — Ambulatory Visit: Payer: Medicare Other | Admitting: Nurse Practitioner

## 2015-09-22 DIAGNOSIS — M7989 Other specified soft tissue disorders: Secondary | ICD-10-CM | POA: Diagnosis not present

## 2015-09-22 DIAGNOSIS — S8002XA Contusion of left knee, initial encounter: Secondary | ICD-10-CM | POA: Diagnosis not present

## 2015-09-22 DIAGNOSIS — S93402A Sprain of unspecified ligament of left ankle, initial encounter: Secondary | ICD-10-CM | POA: Diagnosis not present

## 2015-09-22 DIAGNOSIS — S80811A Abrasion, right lower leg, initial encounter: Secondary | ICD-10-CM | POA: Diagnosis not present

## 2015-09-22 DIAGNOSIS — N3000 Acute cystitis without hematuria: Secondary | ICD-10-CM | POA: Diagnosis not present

## 2015-09-22 DIAGNOSIS — S92902A Unspecified fracture of left foot, initial encounter for closed fracture: Secondary | ICD-10-CM | POA: Diagnosis not present

## 2015-09-22 DIAGNOSIS — M79662 Pain in left lower leg: Secondary | ICD-10-CM | POA: Diagnosis not present

## 2015-09-22 DIAGNOSIS — S92322A Displaced fracture of second metatarsal bone, left foot, initial encounter for closed fracture: Secondary | ICD-10-CM | POA: Diagnosis not present

## 2015-09-22 DIAGNOSIS — S93422A Sprain of deltoid ligament of left ankle, initial encounter: Secondary | ICD-10-CM | POA: Diagnosis not present

## 2015-09-22 DIAGNOSIS — S92335A Nondisplaced fracture of third metatarsal bone, left foot, initial encounter for closed fracture: Secondary | ICD-10-CM | POA: Diagnosis not present

## 2015-09-22 DIAGNOSIS — M25572 Pain in left ankle and joints of left foot: Secondary | ICD-10-CM | POA: Diagnosis not present

## 2015-09-26 DIAGNOSIS — S92325A Nondisplaced fracture of second metatarsal bone, left foot, initial encounter for closed fracture: Secondary | ICD-10-CM | POA: Diagnosis not present

## 2015-09-26 DIAGNOSIS — S92335A Nondisplaced fracture of third metatarsal bone, left foot, initial encounter for closed fracture: Secondary | ICD-10-CM | POA: Diagnosis not present

## 2015-09-29 DIAGNOSIS — S92335D Nondisplaced fracture of third metatarsal bone, left foot, subsequent encounter for fracture with routine healing: Secondary | ICD-10-CM | POA: Diagnosis not present

## 2015-10-06 DIAGNOSIS — S92335D Nondisplaced fracture of third metatarsal bone, left foot, subsequent encounter for fracture with routine healing: Secondary | ICD-10-CM | POA: Diagnosis not present

## 2015-10-06 DIAGNOSIS — S8002XD Contusion of left knee, subsequent encounter: Secondary | ICD-10-CM | POA: Diagnosis not present

## 2015-10-12 DIAGNOSIS — I5022 Chronic systolic (congestive) heart failure: Secondary | ICD-10-CM | POA: Diagnosis not present

## 2015-10-12 DIAGNOSIS — N183 Chronic kidney disease, stage 3 (moderate): Secondary | ICD-10-CM | POA: Diagnosis not present

## 2015-10-12 DIAGNOSIS — I10 Essential (primary) hypertension: Secondary | ICD-10-CM | POA: Diagnosis not present

## 2015-10-12 DIAGNOSIS — E1310 Other specified diabetes mellitus with ketoacidosis without coma: Secondary | ICD-10-CM | POA: Diagnosis not present

## 2015-10-12 DIAGNOSIS — E1129 Type 2 diabetes mellitus with other diabetic kidney complication: Secondary | ICD-10-CM | POA: Diagnosis not present

## 2015-10-17 DIAGNOSIS — J180 Bronchopneumonia, unspecified organism: Secondary | ICD-10-CM | POA: Diagnosis not present

## 2015-10-20 DIAGNOSIS — S92335D Nondisplaced fracture of third metatarsal bone, left foot, subsequent encounter for fracture with routine healing: Secondary | ICD-10-CM | POA: Diagnosis not present

## 2015-10-24 DIAGNOSIS — J180 Bronchopneumonia, unspecified organism: Secondary | ICD-10-CM | POA: Diagnosis not present

## 2015-10-25 ENCOUNTER — Ambulatory Visit: Payer: Medicare Other | Admitting: Endocrinology

## 2015-12-01 DIAGNOSIS — A938 Other specified arthropod-borne viral fevers: Secondary | ICD-10-CM | POA: Diagnosis not present

## 2015-12-01 DIAGNOSIS — A988 Other specified viral hemorrhagic fevers: Secondary | ICD-10-CM | POA: Diagnosis not present

## 2015-12-01 DIAGNOSIS — M109 Gout, unspecified: Secondary | ICD-10-CM | POA: Diagnosis not present

## 2015-12-22 ENCOUNTER — Other Ambulatory Visit: Payer: Self-pay | Admitting: Cardiovascular Disease

## 2016-01-31 DIAGNOSIS — I25119 Atherosclerotic heart disease of native coronary artery with unspecified angina pectoris: Secondary | ICD-10-CM | POA: Diagnosis not present

## 2016-01-31 DIAGNOSIS — E785 Hyperlipidemia, unspecified: Secondary | ICD-10-CM | POA: Diagnosis not present

## 2016-01-31 DIAGNOSIS — I5042 Chronic combined systolic (congestive) and diastolic (congestive) heart failure: Secondary | ICD-10-CM | POA: Diagnosis not present

## 2016-02-13 DIAGNOSIS — I10 Essential (primary) hypertension: Secondary | ICD-10-CM | POA: Diagnosis not present

## 2016-02-13 DIAGNOSIS — E039 Hypothyroidism, unspecified: Secondary | ICD-10-CM | POA: Diagnosis not present

## 2016-02-13 DIAGNOSIS — I5022 Chronic systolic (congestive) heart failure: Secondary | ICD-10-CM | POA: Diagnosis not present

## 2016-02-13 DIAGNOSIS — I214 Non-ST elevation (NSTEMI) myocardial infarction: Secondary | ICD-10-CM | POA: Diagnosis not present

## 2016-02-13 DIAGNOSIS — E1129 Type 2 diabetes mellitus with other diabetic kidney complication: Secondary | ICD-10-CM | POA: Diagnosis not present

## 2016-02-13 DIAGNOSIS — E785 Hyperlipidemia, unspecified: Secondary | ICD-10-CM | POA: Diagnosis not present

## 2016-02-13 DIAGNOSIS — M109 Gout, unspecified: Secondary | ICD-10-CM | POA: Diagnosis not present

## 2016-02-28 ENCOUNTER — Other Ambulatory Visit: Payer: Self-pay | Admitting: Cardiovascular Disease

## 2016-03-08 DIAGNOSIS — I10 Essential (primary) hypertension: Secondary | ICD-10-CM | POA: Diagnosis not present

## 2016-03-08 DIAGNOSIS — I5042 Chronic combined systolic (congestive) and diastolic (congestive) heart failure: Secondary | ICD-10-CM | POA: Diagnosis not present

## 2016-03-08 DIAGNOSIS — E785 Hyperlipidemia, unspecified: Secondary | ICD-10-CM | POA: Diagnosis not present

## 2016-03-12 ENCOUNTER — Telehealth: Payer: Self-pay | Admitting: *Deleted

## 2016-03-12 NOTE — Telephone Encounter (Signed)
Pt due for follow up with Dr. Angelena Form.  I spoke with pt. She reports she is very appreciative of care provided by Dr. Angelena Form but due to transportation issues she is going to follow up with a cardiologist closer to her home.  I asked her to have new physician's office contact us if they need her records.

## 2016-04-23 DIAGNOSIS — H524 Presbyopia: Secondary | ICD-10-CM | POA: Diagnosis not present

## 2016-04-23 DIAGNOSIS — E113293 Type 2 diabetes mellitus with mild nonproliferative diabetic retinopathy without macular edema, bilateral: Secondary | ICD-10-CM | POA: Diagnosis not present

## 2016-04-23 DIAGNOSIS — H26493 Other secondary cataract, bilateral: Secondary | ICD-10-CM | POA: Diagnosis not present

## 2016-04-26 DIAGNOSIS — K5730 Diverticulosis of large intestine without perforation or abscess without bleeding: Secondary | ICD-10-CM | POA: Diagnosis not present

## 2016-05-01 ENCOUNTER — Ambulatory Visit (HOSPITAL_COMMUNITY)
Admission: RE | Admit: 2016-05-01 | Discharge: 2016-05-01 | Disposition: A | Discharge status: Home / Self Care | Payer: Medicare Other | Source: Ambulatory Visit | Attending: Cardiovascular Disease | Admitting: Cardiovascular Disease

## 2016-05-01 ENCOUNTER — Encounter: Payer: Self-pay | Admitting: Physician Assistant

## 2016-05-01 DIAGNOSIS — E785 Hyperlipidemia, unspecified: Secondary | ICD-10-CM | POA: Diagnosis not present

## 2016-05-01 DIAGNOSIS — I2510 Atherosclerotic heart disease of native coronary artery without angina pectoris: Secondary | ICD-10-CM | POA: Insufficient documentation

## 2016-05-01 DIAGNOSIS — I6522 Occlusion and stenosis of left carotid artery: Secondary | ICD-10-CM | POA: Insufficient documentation

## 2016-05-01 DIAGNOSIS — I10 Essential (primary) hypertension: Secondary | ICD-10-CM | POA: Diagnosis not present

## 2016-05-01 DIAGNOSIS — R0989 Other specified symptoms and signs involving the circulatory and respiratory systems: Secondary | ICD-10-CM

## 2016-05-01 DIAGNOSIS — E119 Type 2 diabetes mellitus without complications: Secondary | ICD-10-CM | POA: Insufficient documentation

## 2016-05-01 DIAGNOSIS — Z951 Presence of aortocoronary bypass graft: Secondary | ICD-10-CM | POA: Diagnosis not present

## 2016-05-02 ENCOUNTER — Telehealth: Payer: Self-pay | Admitting: *Deleted

## 2016-05-02 NOTE — Telephone Encounter (Signed)
Pt notified of carotid US results and findings by phone with verbal understanding. Pt did ask if I would send a copy of results to her new Cardiologist Dr. Bettina Gavia with [facility]. I will also send a copy of results to PCP as well. Pt thanked me for the call today.

## 2016-05-09 DIAGNOSIS — I5042 Chronic combined systolic (congestive) and diastolic (congestive) heart failure: Secondary | ICD-10-CM | POA: Diagnosis not present

## 2016-05-09 DIAGNOSIS — I25119 Atherosclerotic heart disease of native coronary artery with unspecified angina pectoris: Secondary | ICD-10-CM | POA: Diagnosis not present

## 2016-05-09 DIAGNOSIS — I10 Essential (primary) hypertension: Secondary | ICD-10-CM | POA: Diagnosis not present

## 2016-05-22 DIAGNOSIS — Z7982 Long term (current) use of aspirin: Secondary | ICD-10-CM | POA: Diagnosis not present

## 2016-05-22 DIAGNOSIS — M109 Gout, unspecified: Secondary | ICD-10-CM | POA: Diagnosis not present

## 2016-05-22 DIAGNOSIS — D3A026 Benign carcinoid tumor of the rectum: Secondary | ICD-10-CM | POA: Diagnosis not present

## 2016-05-22 DIAGNOSIS — K5730 Diverticulosis of large intestine without perforation or abscess without bleeding: Secondary | ICD-10-CM | POA: Diagnosis not present

## 2016-05-22 DIAGNOSIS — J45909 Unspecified asthma, uncomplicated: Secondary | ICD-10-CM | POA: Diagnosis not present

## 2016-05-22 DIAGNOSIS — Z952 Presence of prosthetic heart valve: Secondary | ICD-10-CM | POA: Diagnosis not present

## 2016-05-22 DIAGNOSIS — K648 Other hemorrhoids: Secondary | ICD-10-CM | POA: Diagnosis not present

## 2016-05-22 DIAGNOSIS — I10 Essential (primary) hypertension: Secondary | ICD-10-CM | POA: Diagnosis not present

## 2016-05-22 DIAGNOSIS — Z96651 Presence of right artificial knee joint: Secondary | ICD-10-CM | POA: Diagnosis not present

## 2016-05-22 DIAGNOSIS — Z8673 Personal history of transient ischemic attack (TIA), and cerebral infarction without residual deficits: Secondary | ICD-10-CM | POA: Diagnosis not present

## 2016-05-22 DIAGNOSIS — Z79899 Other long term (current) drug therapy: Secondary | ICD-10-CM | POA: Diagnosis not present

## 2016-05-22 DIAGNOSIS — Z86010 Personal history of colonic polyps: Secondary | ICD-10-CM | POA: Diagnosis not present

## 2016-05-22 DIAGNOSIS — E039 Hypothyroidism, unspecified: Secondary | ICD-10-CM | POA: Diagnosis not present

## 2016-05-22 DIAGNOSIS — I252 Old myocardial infarction: Secondary | ICD-10-CM | POA: Diagnosis not present

## 2016-05-22 DIAGNOSIS — E119 Type 2 diabetes mellitus without complications: Secondary | ICD-10-CM | POA: Diagnosis not present

## 2016-05-22 DIAGNOSIS — Z794 Long term (current) use of insulin: Secondary | ICD-10-CM | POA: Diagnosis not present

## 2016-05-24 ENCOUNTER — Other Ambulatory Visit: Payer: Self-pay

## 2016-05-24 MED ORDER — CLOPIDOGREL BISULFATE 75 MG PO TABS: 75.0000 mg | ORAL_TABLET | Freq: Every day | ORAL | 0 refills | Status: DC

## 2016-05-24 MED ORDER — FUROSEMIDE 40 MG PO TABS: 40.0000 mg | ORAL_TABLET | Freq: Every day | ORAL | 0 refills | Status: DC

## 2016-07-12 ENCOUNTER — Other Ambulatory Visit: Payer: Self-pay | Admitting: Cardiovascular Disease

## 2016-07-13 NOTE — Telephone Encounter (Signed)
Approved    Disp Refills Start End  clopidogrel (PLAVIX) 75 MG tablet 90 tablet 0 05/24/2016   Sig - Route:  Take 1 tablet (75 mg total) by mouth daily. - Oral  Class:  Normal  DAW:  No  Comment:  Patient needs to schedule follow up visit  Authorizing Provider:  Burnell Blanks, MD  Ordering User:  Velna Ochs, CMA  furosemide (LASIX) 40 MG tablet 90 tablet 0 05/24/2016   Sig - Route:  Take 1 tablet (40 mg total) by mouth daily. - Oral  Class:  Normal  DAW:  No  Authorizing Provider:  Burnell Blanks, MD  Ordering User:  Velna Ochs, [place], [place]

## 2016-08-23 DIAGNOSIS — E119 Type 2 diabetes mellitus without complications: Secondary | ICD-10-CM | POA: Diagnosis not present

## 2016-08-30 DIAGNOSIS — Z951 Presence of aortocoronary bypass graft: Secondary | ICD-10-CM | POA: Diagnosis not present

## 2016-08-30 DIAGNOSIS — I25119 Atherosclerotic heart disease of native coronary artery with unspecified angina pectoris: Secondary | ICD-10-CM | POA: Diagnosis not present

## 2016-08-30 DIAGNOSIS — I5042 Chronic combined systolic (congestive) and diastolic (congestive) heart failure: Secondary | ICD-10-CM | POA: Diagnosis not present

## 2016-08-30 DIAGNOSIS — I10 Essential (primary) hypertension: Secondary | ICD-10-CM | POA: Diagnosis not present

## 2016-08-30 DIAGNOSIS — E785 Hyperlipidemia, unspecified: Secondary | ICD-10-CM | POA: Diagnosis not present

## 2016-09-11 DIAGNOSIS — R079 Chest pain, unspecified: Secondary | ICD-10-CM | POA: Diagnosis not present

## 2016-09-11 DIAGNOSIS — I509 Heart failure, unspecified: Secondary | ICD-10-CM | POA: Diagnosis not present

## 2016-09-11 DIAGNOSIS — I259 Chronic ischemic heart disease, unspecified: Secondary | ICD-10-CM | POA: Diagnosis not present

## 2016-09-19 DIAGNOSIS — R079 Chest pain, unspecified: Secondary | ICD-10-CM | POA: Diagnosis not present

## 2016-10-15 DIAGNOSIS — E113293 Type 2 diabetes mellitus with mild nonproliferative diabetic retinopathy without macular edema, bilateral: Secondary | ICD-10-CM | POA: Diagnosis not present

## 2016-10-15 DIAGNOSIS — H26493 Other secondary cataract, bilateral: Secondary | ICD-10-CM | POA: Diagnosis not present

## 2016-11-08 DIAGNOSIS — J449 Chronic obstructive pulmonary disease, unspecified: Secondary | ICD-10-CM | POA: Diagnosis not present

## 2016-11-08 DIAGNOSIS — I10 Essential (primary) hypertension: Secondary | ICD-10-CM | POA: Diagnosis not present

## 2016-11-08 DIAGNOSIS — M1000 Idiopathic gout, unspecified site: Secondary | ICD-10-CM | POA: Diagnosis not present

## 2016-11-08 DIAGNOSIS — E782 Mixed hyperlipidemia: Secondary | ICD-10-CM | POA: Diagnosis not present

## 2016-11-08 DIAGNOSIS — E1129 Type 2 diabetes mellitus with other diabetic kidney complication: Secondary | ICD-10-CM | POA: Diagnosis not present

## 2016-11-08 DIAGNOSIS — N390 Urinary tract infection, site not specified: Secondary | ICD-10-CM | POA: Diagnosis not present

## 2016-11-08 DIAGNOSIS — K210 Gastro-esophageal reflux disease with esophagitis: Secondary | ICD-10-CM | POA: Diagnosis not present

## 2016-11-28 DIAGNOSIS — M1000 Idiopathic gout, unspecified site: Secondary | ICD-10-CM | POA: Diagnosis not present

## 2016-11-28 DIAGNOSIS — N390 Urinary tract infection, site not specified: Secondary | ICD-10-CM | POA: Diagnosis not present

## 2016-12-03 DIAGNOSIS — Z955 Presence of coronary angioplasty implant and graft: Secondary | ICD-10-CM | POA: Diagnosis not present

## 2016-12-03 DIAGNOSIS — I10 Essential (primary) hypertension: Secondary | ICD-10-CM | POA: Diagnosis not present

## 2016-12-03 DIAGNOSIS — I252 Old myocardial infarction: Secondary | ICD-10-CM | POA: Diagnosis not present

## 2016-12-03 DIAGNOSIS — I2510 Atherosclerotic heart disease of native coronary artery without angina pectoris: Secondary | ICD-10-CM | POA: Diagnosis not present

## 2016-12-03 DIAGNOSIS — Z951 Presence of aortocoronary bypass graft: Secondary | ICD-10-CM | POA: Diagnosis not present

## 2016-12-07 DIAGNOSIS — N390 Urinary tract infection, site not specified: Secondary | ICD-10-CM | POA: Diagnosis not present

## 2016-12-07 DIAGNOSIS — J441 Chronic obstructive pulmonary disease with (acute) exacerbation: Secondary | ICD-10-CM | POA: Diagnosis not present

## 2016-12-10 DIAGNOSIS — I252 Old myocardial infarction: Secondary | ICD-10-CM | POA: Diagnosis not present

## 2016-12-10 DIAGNOSIS — I10 Essential (primary) hypertension: Secondary | ICD-10-CM | POA: Diagnosis not present

## 2017-01-04 IMAGING — DX DG CHEST 2V
2 series · 2 of 2 positions shown · non-contrast
Comparison: Chest x-ray 08/19/2014.

CLINICAL DATA: 79-year-old female with chest pressure for the past
3 days. Cannot lie flat while sleeping. Shortness of breath.

EXAM:
CHEST  2 VIEW

[chest pa]
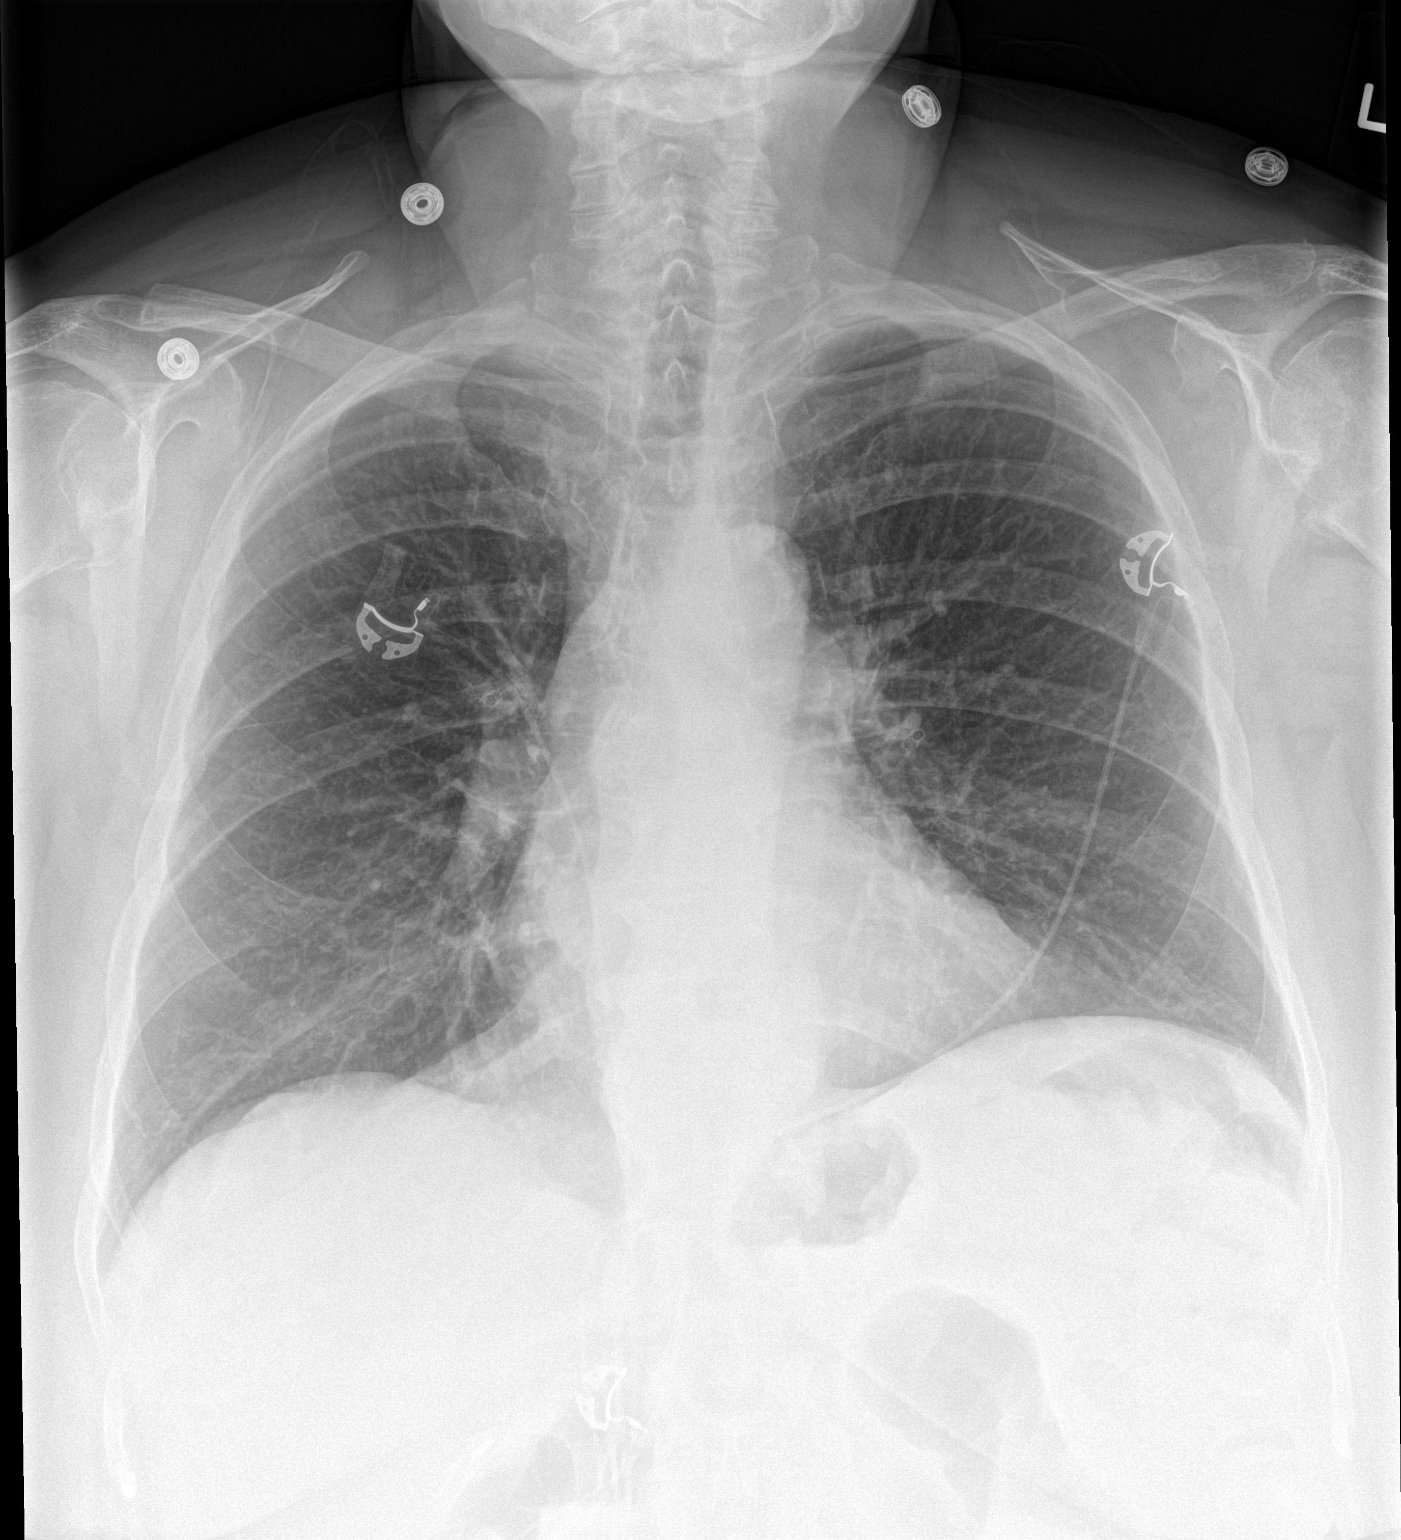

[chest lat]
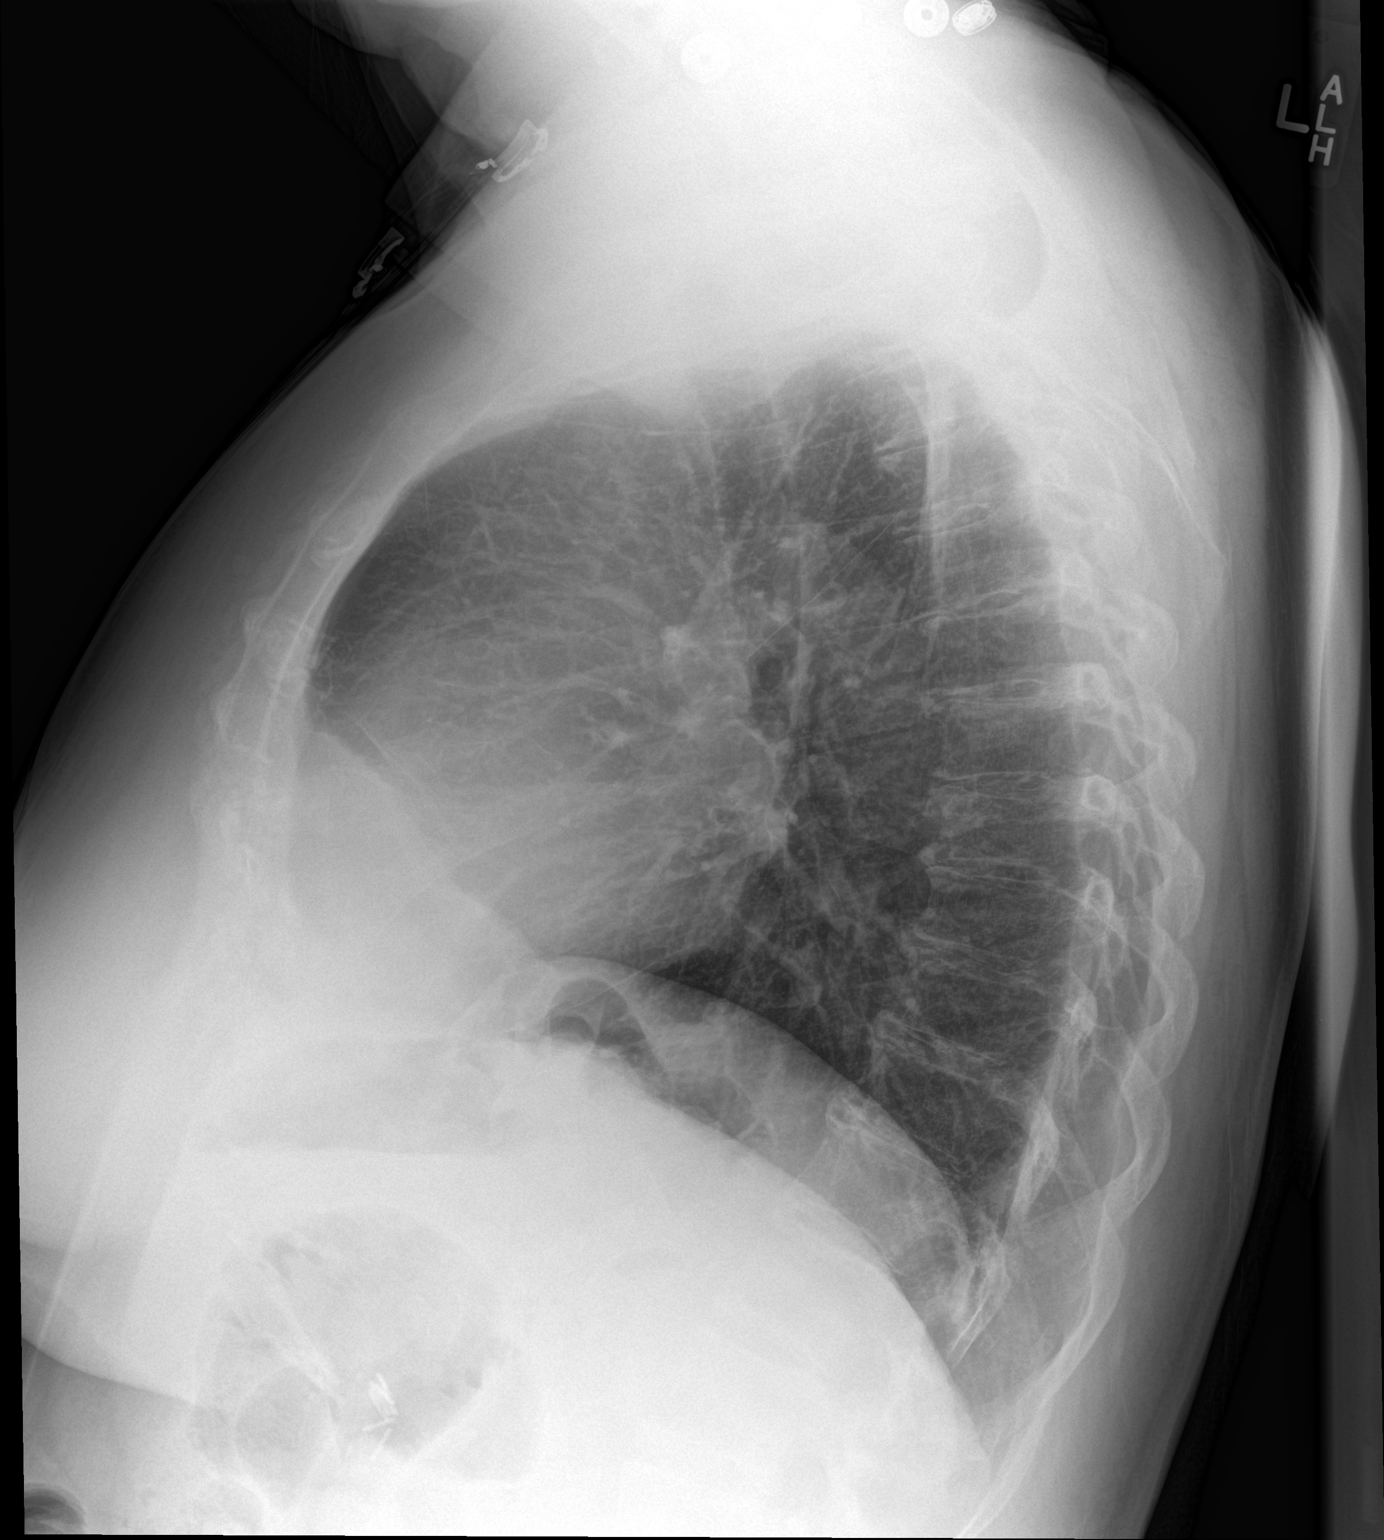

[2 of 2 positions shown; findings below may reference images not displayed]

FINDINGS: Lung volumes are normal. No consolidative airspace disease. No
pleural effusions. No pneumothorax. No pulmonary nodule or mass
noted. Pulmonary vasculature and the cardiomediastinal silhouette
are within normal limits. Atherosclerosis in the thoracic aorta.
Surgical clips project over the right upper quadrant of the abdomen,
likely from prior cholecystectomy.
IMPRESSION: 1.  No radiographic evidence of acute cardiopulmonary disease.
2. Atherosclerosis.

## 2017-03-14 DIAGNOSIS — J449 Chronic obstructive pulmonary disease, unspecified: Secondary | ICD-10-CM | POA: Diagnosis not present

## 2017-03-14 DIAGNOSIS — E782 Mixed hyperlipidemia: Secondary | ICD-10-CM | POA: Diagnosis not present

## 2017-03-14 DIAGNOSIS — K210 Gastro-esophageal reflux disease with esophagitis: Secondary | ICD-10-CM | POA: Diagnosis not present

## 2017-03-14 DIAGNOSIS — Z23 Encounter for immunization: Secondary | ICD-10-CM | POA: Diagnosis not present

## 2017-03-14 DIAGNOSIS — I10 Essential (primary) hypertension: Secondary | ICD-10-CM | POA: Diagnosis not present

## 2017-03-14 DIAGNOSIS — M1000 Idiopathic gout, unspecified site: Secondary | ICD-10-CM | POA: Diagnosis not present

## 2017-03-14 DIAGNOSIS — E1129 Type 2 diabetes mellitus with other diabetic kidney complication: Secondary | ICD-10-CM | POA: Diagnosis not present

## 2017-04-24 ENCOUNTER — Encounter (HOSPITAL_COMMUNITY): Payer: Self-pay

## 2017-04-24 ENCOUNTER — Emergency Department (HOSPITAL_COMMUNITY)
Admission: EM | Admit: 2017-04-24 | Discharge: 2017-04-24 | Disposition: A | Discharge status: Home / Self Care | Payer: Medicare Other | Attending: Emergency Medicine | Admitting: Emergency Medicine

## 2017-04-24 ENCOUNTER — Emergency Department (HOSPITAL_COMMUNITY): Payer: Medicare Other

## 2017-04-24 DIAGNOSIS — Z951 Presence of aortocoronary bypass graft: Secondary | ICD-10-CM | POA: Insufficient documentation

## 2017-04-24 DIAGNOSIS — I2510 Atherosclerotic heart disease of native coronary artery without angina pectoris: Secondary | ICD-10-CM | POA: Diagnosis not present

## 2017-04-24 DIAGNOSIS — J40 Bronchitis, not specified as acute or chronic: Secondary | ICD-10-CM | POA: Insufficient documentation

## 2017-04-24 DIAGNOSIS — Z794 Long term (current) use of insulin: Secondary | ICD-10-CM | POA: Insufficient documentation

## 2017-04-24 DIAGNOSIS — E039 Hypothyroidism, unspecified: Secondary | ICD-10-CM | POA: Diagnosis not present

## 2017-04-24 DIAGNOSIS — Z96651 Presence of right artificial knee joint: Secondary | ICD-10-CM | POA: Diagnosis not present

## 2017-04-24 DIAGNOSIS — Z7982 Long term (current) use of aspirin: Secondary | ICD-10-CM | POA: Insufficient documentation

## 2017-04-24 DIAGNOSIS — Z79899 Other long term (current) drug therapy: Secondary | ICD-10-CM | POA: Insufficient documentation

## 2017-04-24 DIAGNOSIS — M79605 Pain in left leg: Secondary | ICD-10-CM | POA: Diagnosis present

## 2017-04-24 DIAGNOSIS — M5432 Sciatica, left side: Secondary | ICD-10-CM

## 2017-04-24 DIAGNOSIS — N183 Chronic kidney disease, stage 3 (moderate): Secondary | ICD-10-CM | POA: Insufficient documentation

## 2017-04-24 DIAGNOSIS — I5042 Chronic combined systolic (congestive) and diastolic (congestive) heart failure: Secondary | ICD-10-CM | POA: Insufficient documentation

## 2017-04-24 DIAGNOSIS — I130 Hypertensive heart and chronic kidney disease with heart failure and stage 1 through stage 4 chronic kidney disease, or unspecified chronic kidney disease: Secondary | ICD-10-CM | POA: Diagnosis not present

## 2017-04-24 DIAGNOSIS — E1122 Type 2 diabetes mellitus with diabetic chronic kidney disease: Secondary | ICD-10-CM | POA: Insufficient documentation

## 2017-04-24 LAB — BASIC METABOLIC PANEL
Anion gap: 10 (ref 5–15)
BUN: 51 mg/dL — AB (ref 6–20)
CHLORIDE: 97 mmol/L — AB (ref 101–111)
CO2: 26 mmol/L (ref 22–32)
Calcium: 9.3 mg/dL (ref 8.9–10.3)
Creatinine, Ser: 1.77 mg/dL — ABNORMAL HIGH (ref 0.44–1.00)
GFR calc Af Amer: 30 mL/min — ABNORMAL LOW (ref >60)
GFR calc non Af Amer: 26 mL/min — ABNORMAL LOW (ref >60)
GLUCOSE: 352 mg/dL — AB (ref 65–99)
POTASSIUM: 4.8 mmol/L (ref 3.5–5.1)
Sodium: 133 mmol/L — ABNORMAL LOW (ref 135–145)

## 2017-04-24 LAB — CBC
HEMATOCRIT: 38.1 % (ref 36.0–46.0)
Hemoglobin: 12.5 g/dL (ref 12.0–15.0)
MCH: 30.4 pg (ref 26.0–34.0)
MCHC: 32.8 g/dL (ref 30.0–36.0)
MCV: 92.7 fL (ref 78.0–100.0)
Platelets: 229 10*3/uL (ref 150–400)
RBC: 4.11 MIL/uL (ref 3.87–5.11)
RDW: 14.7 % (ref 11.5–15.5)
WBC: 5 10*3/uL (ref 4.0–10.5)

## 2017-04-24 MED ORDER — TRAMADOL HCL 50 MG PO TABS: 50.0000 mg | ORAL_TABLET | Freq: Four times a day (QID) | ORAL | 0 refills | Status: DC | PRN

## 2017-04-24 MED ORDER — PREDNISONE 20 MG PO TABS: 40.0000 mg | ORAL_TABLET | Freq: Every day | ORAL | 0 refills | Status: AC

## 2017-04-24 MED ORDER — MORPHINE SULFATE (PF) 4 MG/ML IV SOLN: 4.0000 mg | Freq: Once | INTRAVENOUS | Status: DC

## 2017-04-24 MED ORDER — TRAMADOL HCL 50 MG PO TABS
100.0000 mg | ORAL_TABLET | Freq: Once | ORAL | Status: AC
  Administered 2017-04-24: 100 mg via ORAL
  Filled 2017-04-24: qty 2

## 2017-04-24 MED ORDER — PREDNISONE 20 MG PO TABS
40.0000 mg | ORAL_TABLET | Freq: Every day | ORAL | 0 refills | Status: DC
Start: 2017-04-24 — End: 2017-04-24

## 2017-04-24 NOTE — ED Triage Notes (Signed)
Patient sent by her MD for increased general body aches and left leg numbness and pain with weakness since Sunday. Denies fever, recently treated for pneumonia and unsure if related

## 2017-04-24 NOTE — ED Notes (Signed)
ED Provider at bedside. 

## 2017-04-24 NOTE — ED Provider Notes (Signed)
[facility] EMERGENCY DEPARTMENT Provider Note   CSN: [id number] Arrival date & time: 04/24/17  1200     History   Chief Complaint Chief Complaint  Patient presents with  . leg numbness/pain    HPI NELIA ROGOFF is a 81 y.o. female.  HPI Patient is a 81 year old female presents emergency department with increasing worsening pain down her left leg and difficulty walking secondary to pain in which she believes might be weakness of her left lower extremity.  No fevers or chills.  Denies abdominal pain.  She reports she has been fighting a bronchitis-like symptoms over the past week.  Denies abdominal pain.  Symptoms are moderate in severity.  Has not tried any significant pain medicine at home.   Past Medical History:  Diagnosis Date  . Anemia    a. Noted on labs 02/2014 possibly procedurally related.  . Asthma   . CAD S/P percutaneous coronary angioplasty 01/22/2014   A. (12/2013): POBA - OM2 99% (very tortuous segment) - reduced ~ 50% & TIMI 3 flow, 80-90% stenosis in mid PDA, Irregularities <50% in mRCA, pLAD and prox LCx; b. 02/17/14: Moderate sized fixed defect in Lateral wall --> cath with OM2 restenosis & otherwise stable --> Xience Alpine DES x 2 (2.25 mm x 12 & 8 mm). c. NSTEMI 02/2014 s/p PTCA/balloon angioplasty only to small caliber PDA.  . Carotid stenosis    Carotid US 11/17: L 1-39; FU prn  . Chronic systolic heart failure ([place])    a. ICM b. ECHO (01/2014): EF 25-30%, grade I DD, trivial MR. c. EF by Myoview 02/17/14 = ~53%. d. EF by cath 02/24/14 - improved to 50-55%.  . CKD (chronic kidney disease), stage III ([place])   . Contrast media allergy   . Environmental allergies   . GERD (gastroesophageal reflux disease)   . Gout, unspecified   . Hyperlipidemia    a. H/o intolerance to Zocor, Lipitor. Had muscle aches on higher dose Crestor.  . Hypertension   . Hypothyroidism   . LBBB (left bundle branch block)    a. Transient during 02/2014 admission.  .  NSTEMI (non-ST elevated myocardial infarction) ([place]) 2014; 12/2013, 01/2014  . QT prolongation    a. Noted on EKG 02/2014.  Marland Kitchen Sinus bradycardia    a. HR 40s-50s during 02/2014 admission, limiting BB dose.  Mar[place] Kitchen TIA (transient ischemic attack) 1990's   a. TIA vs stroke 1990's "mild" - sounds like a TIA as she was told she had stroke symptoms but negative scans. Denies residual effects.  . Type II diabetes mellitus [facility]     Patient Active Problem List   Diagnosis Date Noted  . S/P CABG x 4 04/25/2015  . Unstable angina ([place]) 04/19/2015  . Chronic diastolic CHF (congestive heart failure) ([place]) 04/19/2015  . GERD (gastroesophageal reflux disease) 04/19/2015  . Spinal stenosis, lumbar region, with neurogenic claudication 12/22/2014  . Diabetes ([place]) 04/10/2014  . Asthma, moderate persistent 03/23/2014  . Post-infarction angina ([place]) 02/17/2014  . Abnormal nuclear stress test: Intermediate risk 02/17/2014  . Presence of drug coated stent in left circumflex coronary artery: OM2 - Xience Alpine DES 2.25 mm x 12, 2.25 mm x 8 mm overlap 02/17/2014    Class: Acute  . Ischemic cardiomyopathy - with near resolution of LVEF post MI 02/15/2014  . Chronic systolic CHF (congestive heart failure) ([place]) 02/15/2014  . CKD stage 3 due to type 2 diabetes mellitus ([place]) 02/15/2014  . Chronic systolic HF (heart failure) ([place])  02/01/2014  . Angina at rest [facility]) 01/22/2014  . Hyperlipidemia with target LDL less than 70 01/22/2014  . History of non-ST elevation myocardial infarction (NSTEMI) 01/22/2014  . CAD S/P percutaneous coronary angioplasty 01/22/2014  . S/P PTCA (percutaneous transluminal coronary angioplasty)01/22/14 01/22/2014  . Essential hypertension, benign 12/05/2012  . Mixed conductive and sensorineural hearing loss 12/05/2012  . Essential hypertension 12/05/2012  . Unspecified hypothyroidism 12/05/2012  . Lower back pain 12/05/2012  . Gout, unspecified     Past Surgical History:  Procedure  Laterality Date  . ABDOMINAL HYSTERECTOMY     complete  . APPENDECTOMY  1959  . CARDIAC CATHETERIZATION  02/24/2014   Procedure: CORONARY BALLOON ANGIOPLASTY;  Surgeon: Burnell Blanks, MD;  Location: [facility] CATH LAB;  Service: Cardiovascular;;  . CARDIAC CATHETERIZATION N/A 04/20/2015   Procedure: Left Heart Cath and Coronary Angiography;  Surgeon: Belva Crome, MD;  Location: [facility] CV LAB;  Service: Cardiovascular;  Laterality: N/A;  . CATARACT EXTRACTION W/ INTRAOCULAR LENS  IMPLANT, BILATERAL Bilateral 2013  . [place]; 1960  . CHOLECYSTECTOMY  1989  . CORONARY ANGIOPLASTY  12/2013   POBA - OM2  . CORONARY ARTERY BYPASS GRAFT N/A 04/25/2015   Procedure: CORONARY ARTERY BYPASS GRAFTING (CABG) x four,  using left internal mammary artery and right leg greater saphenous vein harvested endoscopically;  Surgeon: Gaye Pollack, MD;  Location: [facility] OR;  Service: Open Heart Surgery;  Laterality: N/A;  . JOINT REPLACEMENT     right knee  . LEFT HEART CATHETERIZATION WITH CORONARY ANGIOGRAM N/A 01/22/2014   Procedure: LEFT HEART CATHETERIZATION WITH CORONARY ANGIOGRAM;  Surgeon: Sinclair Grooms, MD;  Location: [facility] CATH LAB;  Service: Cardiovascular;  Laterality: N/A;  . LEFT HEART CATHETERIZATION WITH CORONARY ANGIOGRAM N/A 02/17/2014   Procedure: LEFT HEART CATHETERIZATION WITH CORONARY ANGIOGRAM;  Surgeon: Burnell Blanks, MD;  Location: [facility] CATH LAB;  Service: Cardiovascular;  Laterality: N/A;  . LEFT HEART CATHETERIZATION WITH CORONARY ANGIOGRAM N/A 02/24/2014   Procedure: LEFT HEART CATHETERIZATION WITH CORONARY ANGIOGRAM;  Surgeon: Burnell Blanks, MD;  Location: [facility] CATH LAB;  Service: Cardiovascular;  Laterality: N/A;  . LUMBAR LAMINECTOMY/DECOMPRESSION MICRODISCECTOMY Left 12/22/2014   Procedure: CENTRAL DECOMPRESSIVE LUMBAR, AND LAMINECTOMY L5-S1,  S1-S2 FOR SPINAL STENOSIS FORAMINOTOMY L5, S1, S2 ROOTS ON LEFT FOR FORAMINAL STENOSIS, AND DECOMPRESSION L5-S1,  S1-S2 ACCORDING TO LABELING OF [place] X-RAYS;  Surgeon: Latanya Maudlin, MD;  Location: [facility] ORS;  Service: Orthopedics;  Laterality: Left;  . PERCUTANEOUS CORONARY STENT INTERVENTION (PCI-S)  02/17/14   For restenosis of OM2 - PCI with Xience Apline DES 2.25 mm x 12 mm & 2.25 mm x 8 mm overlapping  . REDUCTION MAMMAPLASTY Bilateral 1990's  . SHOULDER OPEN ROTATOR CUFF REPAIR Bilateral 1980's - 1990's  . TEE WITHOUT CARDIOVERSION N/A 04/25/2015   Procedure: TRANSESOPHAGEAL ECHOCARDIOGRAM (TEE);  Surgeon: Gaye Pollack, MD;  Location: [facility];  Service: Open Heart Surgery;  Laterality: N/A;  . TOTAL KNEE ARTHROPLASTY Right 2009    OB History    No data available       Home Medications    Prior to Admission medications   Medication Sig Start Date End Date Taking? Authorizing Provider  allopurinol (ZYLOPRIM) 300 MG tablet Take 300 mg by mouth every morning.    Yes [provider]  aspirin EC 81 MG EC tablet Take 1 tablet (81 mg total) by mouth daily. 01/25/14  Yes Rande Brunt, NP  Biotin 5000 MCG  CAPS Take 1 capsule by mouth every morning.    Yes [provider]  carvedilol (COREG) 6.25 MG tablet Take 6.25 mg by mouth 2 (two) times daily with a meal.   Yes [provider]  clopidogrel (PLAVIX) 75 MG tablet Take 1 tablet (75 mg total) by mouth daily. 05/24/16  Yes Burnell Blanks, MD  colchicine 0.6 MG tablet Take 0.6 mg by mouth daily as needed (gout flare up.).  02/19/14  Yes Thurnell Lose, MD  famotidine (PEPCID) 10 MG tablet Take 10 mg by mouth 2 (two) times daily.   Yes [provider]  furosemide (LASIX) 80 MG tablet Take 80 mg by mouth daily.   Yes [provider]  gabapentin (NEURONTIN) 300 MG capsule Take 300 mg by mouth 2 (two) times daily.   Yes [provider]  insulin regular (NOVOLIN R) 100 units/mL injection Inject 30 Units into the skin daily. Pt uses sliding scale   Yes [provider]    levothyroxine (SYNTHROID, LEVOTHROID) 50 MCG tablet Take 50 mcg by mouth every morning.  05/31/14  Yes [provider]  lisinopril (PRINIVIL,ZESTRIL) 2.5 MG tablet Take 1 tablet (2.5 mg total) by mouth daily. Patient taking differently: Take 2.5 mg by mouth every morning.  02/19/14  Yes Thurnell Lose, MD  ONE TOUCH ULTRA TEST test strip 1 each by Other route as needed for other.  04/18/15  Yes [provider]  pantoprazole (PROTONIX) 40 MG tablet TAKE 1 TABLET BY MOUTH ONCE DAILY 08/12/15  Yes Burnell Blanks, MD  PROAIR HFA 108 (90 BASE) MCG/ACT inhaler Inhale 1-2 puffs into the lungs every 4 (four) hours as needed for wheezing or shortness of breath. For breathing 03/22/14  Yes [provider]  carvedilol (COREG) 3.125 MG tablet TAKE 1 TABLET BY MOUTH TWICE DAILY WITH MEALS Patient not taking: Reported on 04/24/2017 07/22/15   Burnell Blanks, MD  furosemide (LASIX) 40 MG tablet Take 1 tablet (40 mg total) by mouth daily. Patient not taking: Reported on 04/24/2017 05/24/16   Burnell Blanks, MD  insulin NPH-regular Human (HUMULIN 70/30) (70-30) 100 UNIT/ML injection Inject 32 Units into the skin daily with breakfast. And syringes 1/day Patient not taking: Reported on 04/24/2017 05/27/15   Renato Shin, MD  predniSONE (DELTASONE) 20 MG tablet Take 2 tablets (40 mg total) by mouth daily. 04/24/17 04/29/17  Jola Schmidt, MD  traMADol (ULTRAM) 50 MG tablet Take 1 tablet (50 mg total) by mouth every 6 (six) hours as needed. 04/24/17   Jola Schmidt, MD    Family History Family History  Problem Relation Age of Onset  . Hypertension Mother   . Diabetes Mother   . Heart attack Mother 81  . Heart disease Father   . Emphysema Father   . Diabetes Brother   . Heart disease Brother   . Cancer Sister   . Stroke Neg Hx     Social History Social History  Substance Use Topics  . Smoking status: Never Smoker  . Smokeless tobacco: Never Used  .  Alcohol use No     Allergies   Contrast media [iodinated diagnostic agents]; Cortizone-10 [hydrocortisone]; Lipitor [atorvastatin]; and Zocor [simvastatin]   Review of Systems Review of Systems  All other systems reviewed and are negative.    Physical Exam Updated Vital Signs BP (!) 117/44   Pulse 65   Temp 97.9 F (36.6 C) (Oral)   Resp 16   Ht 5' 7.5" (1.715 m)  Wt 97.1 kg (214 lb)   SpO2 96%   BMI 33.02 kg/m   Physical Exam  Constitutional: She is oriented to person, place, and time. She appears well-developed and well-nourished.  HENT:  Head: Normocephalic.  Eyes: EOM are normal.  Neck: Normal range of motion.  Pulmonary/Chest: Effort normal.  Abdominal: She exhibits no distension.  Musculoskeletal: Normal range of motion.  Full range of motion of bilateral hips, knees, ankles.  Normal pulses left foot.  No swelling of the left lower extremity as compared to the right.  No L-spine tenderness  Neurological: She is alert and oriented to person, place, and time.  Psychiatric: She has a normal mood and affect.  Nursing note and vitals reviewed.    ED Treatments / Results  Labs (all labs ordered are listed, but only abnormal results are displayed) Labs Reviewed  BASIC METABOLIC PANEL - Abnormal; Notable for the following:       Result Value   Sodium 133 (*)    Chloride 97 (*)    Glucose, Bld 352 (*)    BUN 51 (*)    Creatinine, Ser 1.77 (*)    GFR calc non Af Amer 26 (*)    GFR calc Af Amer 30 (*)    All other components within normal limits  CBC    EKG  EKG Interpretation  Date/Time:  Wednesday April 24 2017 12:16:16 EDT Ventricular Rate:  77 PR Interval:  208 QRS Duration: 80 QT Interval:  344 QTC Calculation: 389 R Axis:   33 Text Interpretation:  Normal sinus rhythm Cannot rule out Anterior infarct , age undetermined Abnormal ECG No significant change was found Confirmed by Jola Schmidt [phone]) on 04/24/2017 2:06:38 PM        Radiology Dg Chest 2 View  Result Date: 04/24/2017 CLINICAL DATA:  Productive cough over the last 3 weeks.  Chest pain. EXAM: CHEST  2 VIEW COMPARISON:  04/12/2017 FINDINGS: Previous median sternotomy and CABG. Heart size is normal. Chronic aortic atherosclerosis. Chronic bronchial thickening pattern without consolidation or collapse. No effusions. Chronic degenerative changes affect the spine. IMPRESSION: Previous CABG. Aortic atherosclerosis. Bronchitis pattern. No consolidation or collapse. Electronically Signed   By: Nelson Chimes M.D.   On: 04/24/2017 14:46   Mr Lumbar Spine Wo Contrast  Result Date: 04/24/2017 CLINICAL DATA:  81 y/o F; general body aches and of left leg numbness with pain and weakness. EXAM: MRI LUMBAR SPINE WITHOUT CONTRAST TECHNIQUE: Multiplanar, multisequence MR imaging of the lumbar spine was performed. No intravenous contrast was administered. COMPARISON:  None. FINDINGS: Segmentation:  Standard. Alignment:  Physiologic. Vertebrae: L5 superior endplate degenerative edema. No evidence for fracture, diskitis, or suspicious bone lesion. L4-5 and L5-S1 left hemi laminectomy postsurgical changes. Conus medullaris: Extends to the L1 level and appears normal. Paraspinal and other soft tissues: Mild edema within the left-sided lower paraspinal muscles at L4 and L5 level. Correlation for recent intervention versus muscle strain. Disc levels: L1-2: No significant disc displacement, foraminal stenosis, or canal stenosis. L2-3: Minimal disc bulge. No significant foraminal or canal stenosis. L3-4: Small disc bulge and mild facet hypertrophy. No significant foraminal or canal stenosis. L4-5: Small disc bulge eccentric to the right with right extraforaminal marginal osteophytes, mild facet hypertrophy, and mild ligamentum flavum hypertrophy. Mild bilateral foraminal stenosis and lateral recess stenosis. No significant canal stenosis. L5-S1: Minimal disc bulge and mild facet hypertrophy.  Left subarticular and 7 x 12 mm disc protrusion (AP by ML series 7, image 37). The protrusion  effaces the left lateral recess with contact on descending left S1 nerve root. IMPRESSION: 1. L5-S1 left subarticular disc protrusion with contact on descending left S1 nerve root in the left lateral recess. 2. No significant foraminal or canal stenosis. 3. Edema within left-sided lower paraspinal muscles may be related to recent intervention versus muscle strain. 4. Partially visualize enlarge 12 mm common bile duct of uncertain significance. Electronically Signed   By: Kristine Garbe M.D.   On: 04/24/2017 17:20    Procedures Procedures (including critical care time)  Medications Ordered in ED Medications  morphine 4 MG/ML injection 4 mg (4 mg Intravenous Not Given 04/24/17 1453)  traMADol (ULTRAM) tablet 100 mg (100 mg Oral Given 04/24/17 1452)     Initial Impression / Assessment and Plan / ED Course  I have reviewed the triage vital signs and the nursing notes.  Pertinent labs & imaging results that were available during my care of the patient were reviewed by me and considered in my medical decision making (see chart for details).     Left-sided sciatica.  MRI compatible with this.  No indication for acute surgical intervention.  Home with steroids and tramadol.  Significant improvement in her symptoms here in the ER after tramadol.  Ambulatory.  Discharged home in good condition.  Final Clinical Impressions(s) / ED Diagnoses   Final diagnoses:  Sciatica, left side  Bronchitis    New Prescriptions New Prescriptions   PREDNISONE (DELTASONE) 20 MG TABLET    Take 2 tablets (40 mg total) by mouth daily.   TRAMADOL (ULTRAM) 50 MG TABLET    Take 1 tablet (50 mg total) by mouth every 6 (six) hours as needed.     Jola Schmidt, MD 04/24/17 [phone]

## 2017-04-24 NOTE — ED Notes (Signed)
Pt was brought back to ED from radiology. Pt has not had MRI at this time. MRI tech states she will send for transport asap. Family updated on delay.

## 2017-04-24 NOTE — ED Notes (Signed)
Pt helped to the Restroom, pt able to ambulate with steady gait.

## 2017-04-24 NOTE — ED Notes (Signed)
Pt transported to MRI 

## 2017-04-26 ENCOUNTER — Telehealth: Payer: Self-pay | Admitting: Cardiovascular Disease

## 2017-04-26 NOTE — Telephone Encounter (Signed)
OK with me Carol [name]  

## 2017-04-26 NOTE — Telephone Encounter (Signed)
New message    Pt daughter is calling stating that pt wants to schedule her yearly appt with Dr. Tamala Julian. She states pt has seen Dr. Tamala Julian in the past. Is this switch ok?

## 2017-04-27 NOTE — Telephone Encounter (Signed)
ok 

## 2017-04-30 DIAGNOSIS — Z4789 Encounter for other orthopedic aftercare: Secondary | ICD-10-CM | POA: Diagnosis not present

## 2017-06-05 ENCOUNTER — Ambulatory Visit: Payer: Medicare Other | Admitting: Interventional Cardiology

## 2017-06-07 ENCOUNTER — Encounter: Payer: Self-pay | Admitting: Interventional Cardiology

## 2017-06-07 ENCOUNTER — Ambulatory Visit: Payer: Medicare Other | Admitting: Interventional Cardiology

## 2017-06-07 VITALS — BP 144/76 | HR 72 | Ht 67.5 in | Wt 216.6 lb

## 2017-06-07 DIAGNOSIS — Z9861 Coronary angioplasty status: Secondary | ICD-10-CM

## 2017-06-07 DIAGNOSIS — I2510 Atherosclerotic heart disease of native coronary artery without angina pectoris: Secondary | ICD-10-CM | POA: Diagnosis not present

## 2017-06-07 DIAGNOSIS — I10 Essential (primary) hypertension: Secondary | ICD-10-CM

## 2017-06-07 DIAGNOSIS — I255 Ischemic cardiomyopathy: Secondary | ICD-10-CM | POA: Diagnosis not present

## 2017-06-07 DIAGNOSIS — I5022 Chronic systolic (congestive) heart failure: Secondary | ICD-10-CM | POA: Diagnosis not present

## 2017-06-07 DIAGNOSIS — R0789 Other chest pain: Secondary | ICD-10-CM

## 2017-06-07 MED ORDER — ISOSORBIDE MONONITRATE ER 60 MG PO TB24: 60.0000 mg | ORAL_TABLET | Freq: Every day | ORAL | 3 refills | Status: DC

## 2017-06-07 NOTE — Progress Notes (Signed)
Cardiology Office Note    Date:  06/07/2017   ID:  RYAH CRIBB, DOB Mar 20, 1936, MRN [id number]  PCP:  Lillard Anes, MD  Cardiologist: Sinclair Grooms, MD   No chief complaint on file.   History of Present Illness:  Carol [name] is a 81 y.o. female with history of DM, HTN, asthma, hyperthyroidism, HLD,, CKD stage III, left bundle branch block, coronary artery disease with CABG October 2016 with LIMA to LAD, sequential SVG to OM 2 and 3, and SVG to RCA, chronic diastolic systolic heart failure with LVEF improved from 25% to 50%, comes in today as a new patient for longitudinal cardiac management.  Previously followed by Dr. Angelena Form subsequently Dr. Bettina Gavia.  Her complaint at this time is exertional chest burning.  She denies orthopnea and PND.  She feels the burning in her chest is related to a lung problem.  She wants to see a pulmonologist.  She denies orthopnea and PND.  Her acute cardiac events.  All prior angiograms were reviewed.  The initial presentation was with an acute coronary syndrome in 2015.  A very tortuous second obtuse marginal was dilated.  A few weeks later she was readmitted with recurrent chest pain and had a beautiful stent procedure performed on the same vessel by Dr. Angelena Form.  She then had another heart catheterization because of recurrent chest pain on the obtuse marginal was patent.  A third hospitalization in 2016 demonstrated eccentric hypodense greater than 50% left main stenosis in the mid to distal body.  She then underwent coronary bypass grafting.  Past Medical History:  Diagnosis Date  . Anemia    a. Noted on labs 02/2014 possibly procedurally related.  . Asthma   . CAD S/P percutaneous coronary angioplasty 01/22/2014   A. (12/2013): POBA - OM2 99% (very tortuous segment) - reduced ~ 50% & TIMI 3 flow, 80-90% stenosis in mid PDA, Irregularities <50% in mRCA, pLAD and prox LCx; b. 02/17/14: Moderate sized fixed defect in Lateral wall -->  cath with OM2 restenosis & otherwise stable --> Xience Alpine DES x 2 (2.25 mm x 12 & 8 mm). c. NSTEMI 02/2014 s/p PTCA/balloon angioplasty only to small caliber PDA.  . Carotid stenosis    Carotid US 11/17: L 1-39; FU prn  . Chronic systolic heart failure ([place])    a. ICM b. ECHO (01/2014): EF 25-30%, grade I DD, trivial MR. c. EF by Myoview 02/17/14 = ~53%. d. EF by cath 02/24/14 - improved to 50-55%.  . CKD (chronic kidney disease), stage III ([place])   . Contrast media allergy   . Environmental allergies   . GERD (gastroesophageal reflux disease)   . Gout, unspecified   . Hyperlipidemia    a. H/o intolerance to Zocor, Lipitor. Had muscle aches on higher dose Crestor.  . Hypertension   . Hypothyroidism   . LBBB (left bundle branch block)    a. Transient during 02/2014 admission.  . NSTEMI (non-ST elevated myocardial infarction) ([place]) 2014; 12/2013, 01/2014  . QT prolongation    a. Noted on EKG 02/2014.  Marland Kitchen Sinus bradycardia    a. HR 40s-50s during 02/2014 admission, limiting BB dose.  Marland Kitchen TIA (transient ischemic attack) 1990's   a. TIA vs stroke 1990's "mild" - sounds like a TIA as she was told she had stroke symptoms but negative scans. Denies residual effects.  . Type II diabetes mellitus ([place])     Past Surgical History:  Procedure Laterality Date  .  ABDOMINAL HYSTERECTOMY     complete  . APPENDECTOMY  1959  . CARDIAC CATHETERIZATION  02/24/2014   Procedure: CORONARY BALLOON ANGIOPLASTY;  Surgeon: Burnell Blanks, MD;  Location: [facility] CATH LAB;  Service: Cardiovascular;;  . CARDIAC CATHETERIZATION N/A 04/20/2015   Procedure: Left Heart Cath and Coronary Angiography;  Surgeon: Belva Crome, MD;  Location: [facility] CV LAB;  Service: Cardiovascular;  Laterality: N/A;  . CATARACT EXTRACTION W/ INTRAOCULAR LENS  IMPLANT, BILATERAL Bilateral 2013  . [place]; 1960  . CHOLECYSTECTOMY  1989  . CORONARY ANGIOPLASTY  12/2013   POBA - OM2  . CORONARY ARTERY BYPASS GRAFT N/A  04/25/2015   Procedure: CORONARY ARTERY BYPASS GRAFTING (CABG) x four,  using left internal mammary artery and right leg greater saphenous vein harvested endoscopically;  Surgeon: Gaye Pollack, MD;  Location: [facility] OR;  Service: Open Heart Surgery;  Laterality: N/A;  . JOINT REPLACEMENT     right knee  . LEFT HEART CATHETERIZATION WITH CORONARY ANGIOGRAM N/A 01/22/2014   Procedure: LEFT HEART CATHETERIZATION WITH CORONARY ANGIOGRAM;  Surgeon: Sinclair Grooms, MD;  Location: [facility] CATH LAB;  Service: Cardiovascular;  Laterality: N/A;  . LEFT HEART CATHETERIZATION WITH CORONARY ANGIOGRAM N/A 02/17/2014   Procedure: LEFT HEART CATHETERIZATION WITH CORONARY ANGIOGRAM;  Surgeon: Burnell Blanks, MD;  Location: [facility] CATH LAB;  Service: Cardiovascular;  Laterality: N/A;  . LEFT HEART CATHETERIZATION WITH CORONARY ANGIOGRAM N/A 02/24/2014   Procedure: LEFT HEART CATHETERIZATION WITH CORONARY ANGIOGRAM;  Surgeon: Burnell Blanks, MD;  Location: [facility] CATH LAB;  Service: Cardiovascular;  Laterality: N/A;  . LUMBAR LAMINECTOMY/DECOMPRESSION MICRODISCECTOMY Left 12/22/2014   Procedure: CENTRAL DECOMPRESSIVE LUMBAR, AND LAMINECTOMY L5-S1,  S1-S2 FOR SPINAL STENOSIS FORAMINOTOMY L5, S1, S2 ROOTS ON LEFT FOR FORAMINAL STENOSIS, AND DECOMPRESSION L5-S1, S1-S2 ACCORDING TO LABELING OF [place] X-RAYS;  Surgeon: Latanya Maudlin, MD;  Location: [facility] ORS;  Service: Orthopedics;  Laterality: Left;  . PERCUTANEOUS CORONARY STENT INTERVENTION (PCI-S)  02/17/14   For restenosis of OM2 - PCI with Xience Apline DES 2.25 mm x 12 mm & 2.25 mm x 8 mm overlapping  . REDUCTION MAMMAPLASTY Bilateral 1990's  . SHOULDER OPEN ROTATOR CUFF REPAIR Bilateral 1980's - 1990's  . TEE WITHOUT CARDIOVERSION N/A 04/25/2015   Procedure: TRANSESOPHAGEAL ECHOCARDIOGRAM (TEE);  Surgeon: Gaye Pollack, MD;  Location: [facility];  Service: Open Heart Surgery;  Laterality: N/A;  . TOTAL KNEE ARTHROPLASTY Right 2009    Current  Medications: Outpatient Medications Prior to Visit  Medication Sig Dispense Refill  . allopurinol (ZYLOPRIM) 300 MG tablet Take 300 mg by mouth every morning.     Marland Kitchen aspirin EC 81 MG EC tablet Take 1 tablet (81 mg total) by mouth daily. 30 tablet 3  . Biotin 5000 MCG CAPS Take 1 capsule by mouth every morning.     . carvedilol (COREG) 6.25 MG tablet Take 6.25 mg by mouth 2 (two) times daily with a meal.    . clopidogrel (PLAVIX) 75 MG tablet Take 1 tablet (75 mg total) by mouth daily. 90 tablet 0  . colchicine 0.6 MG tablet Take 0.6 mg by mouth daily as needed (gout flare up.).     Marland Kitchen famotidine (PEPCID) 10 MG tablet Take 10 mg by mouth 2 (two) times daily.    . furosemide (LASIX) 80 MG tablet Take 80 mg by mouth daily.    Marland Kitchen gabapentin (NEURONTIN) 300 MG capsule Take 300 mg by mouth 2 (two) times daily.    Marland Kitchen  insulin NPH-regular Human (NOVOLIN 70/30) (70-30) 100 UNIT/ML injection Inject 25-32 Units into the skin daily.    Marland Kitchen levothyroxine (SYNTHROID, LEVOTHROID) 50 MCG tablet Take 50 mcg by mouth every morning.     . nitroGLYCERIN (NITROSTAT) 0.4 MG SL tablet Place 0.4 mg under the tongue every 5 (five) minutes x 3 doses as needed for chest pain.    . ONE TOUCH ULTRA TEST test strip 1 each by Other route as needed for other.     . pantoprazole (PROTONIX) 40 MG tablet TAKE 1 TABLET BY MOUTH ONCE DAILY 30 tablet 10  . traMADol (ULTRAM) 50 MG tablet Take 1 tablet (50 mg total) by mouth every 6 (six) hours as needed. 15 tablet 0  . carvedilol (COREG) 3.125 MG tablet TAKE 1 TABLET BY MOUTH TWICE DAILY WITH MEALS (Patient not taking: Reported on 04/24/2017) 60 tablet 6  . furosemide (LASIX) 40 MG tablet Take 1 tablet (40 mg total) by mouth daily. (Patient not taking: Reported on 04/24/2017) 90 tablet 0  . insulin NPH-regular Human (HUMULIN 70/30) (70-30) 100 UNIT/ML injection Inject 32 Units into the skin daily with breakfast. And syringes 1/day (Patient not taking: Reported on 04/24/2017) 10 mL 11  .  insulin regular (NOVOLIN R) 100 units/mL injection Inject 30 Units into the skin daily. Pt uses sliding scale    . lisinopril (PRINIVIL,ZESTRIL) 2.5 MG tablet Take 1 tablet (2.5 mg total) by mouth daily. (Patient taking differently: Take 2.5 mg by mouth every morning. ) 30 tablet 0  . PROAIR HFA 108 (90 BASE) MCG/ACT inhaler Inhale 1-2 puffs into the lungs every 4 (four) hours as needed for wheezing or shortness of breath. For breathing     No facility-administered medications prior to visit.      Allergies:   Contrast media [iodinated diagnostic agents]; Cortizone-10 [hydrocortisone]; Lipitor [atorvastatin]; and Zocor [simvastatin]   Social History   Socioeconomic History  . Marital status: Widowed    Spouse name: None  . Number of children: 2  . Years of education: None  . Highest education level: None  Social Needs  . Financial resource strain: None  . Food insecurity - worry: None  . Food insecurity - inability: None  . Transportation needs - medical: None  . Transportation needs - non-medical: None  Occupational History  . Occupation: Retired  Tobacco Use  . Smoking status: Never Smoker  . Smokeless tobacco: Never Used  Substance and Sexual Activity  . Alcohol use: No  . Drug use: No  . Sexual activity: No  Other Topics Concern  . None  Social History Narrative   Lives in [place] by herself.      Family History:  The patient's family history includes Cancer in her sister; Diabetes in her brother and mother; Emphysema in her father; Heart attack (age of onset: 14) in her mother; Heart disease in her brother and father; Hypertension in her mother.   ROS:   Please see the history of present illness.    , Hearing loss, vision disturbance, dyspnea on exertion, wheezing, back pain, easy bruising, difficulty with balance. All other systems reviewed and are negative.   PHYSICAL EXAM:   VS:  BP (!) 144/76   Pulse 72   Ht 5' 7.5" (1.715 m)   Wt 216 lb 9.6 oz (98.2 kg)    BMI 33.42 kg/m    GEN: Well nourished, well developed, in no acute distress.  Obese. HEENT: normal  Neck: no JVD, carotid bruits, or masses Cardiac:  RRR; no murmurs, rubs, or gallops,no edema  Respiratory:  clear to auscultation bilaterally, normal work of breathing GI: soft, nontender, nondistended, + BS MS: no deformity or atrophy  Skin: warm and dry, no rash Neuro:  Alert and Oriented x 3, Strength and sensation are intact.  Difficulty engaging in conversation due to hearing loss. Psych: euthymic mood, full affect  Wt Readings from Last 3 Encounters:  06/07/17 216 lb 9.6 oz (98.2 kg)  04/24/17 214 lb (97.1 kg)  07/28/15 212 lb (96.2 kg)      Studies/Labs Reviewed:   EKG:  EKG in October 2018 reveal nonspecific T wave flattening, low voltage, no evidence of infarction.   Recent Labs: 04/24/2017: BUN 51; Creatinine, Ser 1.77; Hemoglobin 12.5; Platelets 229; Potassium 4.8; Sodium 133   Lipid Panel    Component Value Date/Time   CHOL 176 07/26/2014 1115   TRIG 195.0 (H) 07/26/2014 1115   HDL 42.90 07/26/2014 1115   CHOLHDL 4 07/26/2014 1115   VLDL 39.0 07/26/2014 1115   LDLCALC 94 07/26/2014 1115    Additional studies/ records that were reviewed today include:  Cardiac Cath 2016: Diagnostic Diagram      Coronary CT Angiography, 2016:  IMPRESSION: 1) Significant mixed plaque in the LM coronary artery including soft plaque with lucent core suggesting high risk feature and correlating with "hazy" appearance on cath. Stenosis 50-75% And with morphologic features this would be considered surgical Disease.    ASSESSMENT:    1. Burning chest pain   2. CAD S/P percutaneous coronary angioplasty   3. Ischemic cardiomyopathy - with near resolution of LVEF post MI   4. Chronic systolic CHF (congestive heart failure) ([place])   5. Essential hypertension, benign      PLAN:  In order of problems listed above:  1. Imdur 60 mg/day.  Return in 1 month.  Relatively  recent coronary bypass surgery in 2016 as outlined above.  Hopefully Imdur improves chest discomfort.  If exertional discomfort continues, may need to consider repeat coronary angiography.  She states that the burning sensation in her chest is been something that predated coronary bypass surgery and feels the complaint is related to a lung problem.  She wants to see a pulmonologist. 2. CAD status post CABG for left main disease. 3. Post revascularization LVEF is normal. 4. Has resolved to chronic diastolic heart failure with EF greater than 50%. 5. Excellent control today.  We will see the patient back in 1 month and rediscuss her current complaints.  May decide to send her to a pulmonary specialist.  Medication Adjustments/Labs and Tests Ordered: Current medicines are reviewed at length with the patient today.  Concerns regarding medicines are outlined above.  Medication changes, Labs and Tests ordered today are listed in the Patient Instructions below. Patient Instructions  Medication Instructions:  1) START Isosorbide 60mg  once daily 2) You may stop Plavix for up to 7 days prior to your procedure on your toe.  Labwork: None  Testing/Procedures: None  Follow-Up: Your physician recommends that you schedule a follow-up appointment in: 4-6 weeks with Dr. Tamala Julian. (Can have end of clinic on 1/28)   Any Other Special Instructions Will Be Listed Below (If Applicable).     If you need a refill on your cardiac medications before your next appointment, please call your pharmacy.      Signed, Sinclair Grooms, MD  06/07/2017 3:49 PM    [facility] [address], [place], [place]  [address]  Phone: [phone]; Fax: [phone]

## 2017-06-07 NOTE — Patient Instructions (Addendum)
Medication Instructions:  1) START Isosorbide 60mg  once daily 2) You may stop Plavix for up to 7 days prior to your procedure on your toe.  Labwork: None  Testing/Procedures: None  Follow-Up: Your physician recommends that you schedule a follow-up appointment in: 4-6 weeks with Dr. Tamala Julian. (Can have end of clinic on 1/28)   Any Other Special Instructions Will Be Listed Below (If Applicable).     If you need a refill on your cardiac medications before your next appointment, please call your pharmacy.

## 2017-06-27 DIAGNOSIS — M109 Gout, unspecified: Secondary | ICD-10-CM | POA: Diagnosis not present

## 2017-06-27 DIAGNOSIS — I2510 Atherosclerotic heart disease of native coronary artery without angina pectoris: Secondary | ICD-10-CM | POA: Diagnosis not present

## 2017-06-27 DIAGNOSIS — E119 Type 2 diabetes mellitus without complications: Secondary | ICD-10-CM | POA: Diagnosis not present

## 2017-06-27 DIAGNOSIS — Z794 Long term (current) use of insulin: Secondary | ICD-10-CM | POA: Diagnosis not present

## 2017-06-27 DIAGNOSIS — E039 Hypothyroidism, unspecified: Secondary | ICD-10-CM | POA: Diagnosis not present

## 2017-07-17 DIAGNOSIS — E113393 Type 2 diabetes mellitus with moderate nonproliferative diabetic retinopathy without macular edema, bilateral: Secondary | ICD-10-CM | POA: Diagnosis not present

## 2017-07-17 DIAGNOSIS — H4312 Vitreous hemorrhage, left eye: Secondary | ICD-10-CM | POA: Diagnosis not present

## 2017-07-18 DIAGNOSIS — L600 Ingrowing nail: Secondary | ICD-10-CM | POA: Diagnosis not present

## 2017-07-21 NOTE — Progress Notes (Signed)
Cardiology Office Note    Date:  07/22/2017   ID:  Carol [name], DOB [birth year], MRN [id number]  PCP:  Lillard Anes, MD  Cardiologist: Sinclair Grooms, MD   Chief Complaint  Patient presents with  . Coronary Artery Disease  . Shortness of Breath  . Edema    History of Present Illness:  Carol [name] is a 82 y.o. female with history of DM, HTN, asthma, hyperthyroidism, HLD,, CKD stage III, left bundle branch block, coronary artery disease with CABG October 2016 with LIMA to LAD, sequential SVG to OM 2 and 3, and SVG to RCA, chronic diastolic systolic heart failure with LVEF improved from 25% to 50%, comes in today as a new patient for longitudinal cardiac management.   She has multiple complaints.  She was seen within the past 2 months with complaint of burning in her chest.  Isosorbide mononitrate was started at that office visit.  Getting additional history from the patient, I note that this symptom has been present for years predating coronary bypass.  Coronary bypass surgery did not resolve this complaint.  Since starting isosorbide she still has the discomfort.  It does not necessarily come on with physical activity.  She states that the isosorbide has helped to decrease the intensity and the prevalence of the discomfort.  According to the patient, she is concerned about lower extremity swelling.  She will occasionally take an extra dose of furosemide.  The swelling does not go away completely.   Past Medical History:  Diagnosis Date  . Anemia    a. Noted on labs 02/2014 possibly procedurally related.  . Asthma   . CAD S/P percutaneous coronary angioplasty 01/22/2014   A. (12/2013): POBA - OM2 99% (very tortuous segment) - reduced ~ 50% & TIMI 3 flow, 80-90% stenosis in mid PDA, Irregularities <50% in mRCA, pLAD and prox LCx; b. 02/17/14: Moderate sized fixed defect in Lateral wall --> cath with OM2 restenosis & otherwise stable --> Xience Alpine DES x 2 (2.25 mm x  12 & 8 mm). c. NSTEMI 02/2014 s/p PTCA/balloon angioplasty only to small caliber PDA.  . Carotid stenosis    Carotid US 11/17: L 1-39; FU prn  . Chronic systolic heart failure ([place])    a. ICM b. ECHO (01/2014): EF 25-30%, grade I DD, trivial MR. c. EF by Myoview 02/17/14 = ~53%. d. EF by cath 02/24/14 - improved to 50-55%.  . CKD (chronic kidney disease), stage III ([place])   . Contrast media allergy   . Environmental allergies   . GERD (gastroesophageal reflux disease)   . Gout, unspecified   . Hyperlipidemia    a. H/o intolerance to Zocor, Lipitor. Had muscle aches on higher dose Crestor.  . Hypertension   . Hypothyroidism   . LBBB (left bundle branch block)    a. Transient during 02/2014 admission.  . NSTEMI (non-ST elevated myocardial infarction) ([place]) 2014; 12/2013, 01/2014  . QT prolongation    a. Noted on EKG 02/2014.  Marland Kitchen Sinus bradycardia    a. HR 40s-50s during 02/2014 admission, limiting BB dose.  Marland Kitchen TIA (transient ischemic attack) 1990's   a. TIA vs stroke 1990's "mild" - sounds like a TIA as she was told she had stroke symptoms but negative scans. Denies residual effects.  . Type II diabetes mellitus ([place])     Past Surgical History:  Procedure Laterality Date  . ABDOMINAL HYSTERECTOMY     complete  . APPENDECTOMY  1959  .  CARDIAC CATHETERIZATION  02/24/2014   Procedure: CORONARY BALLOON ANGIOPLASTY;  Surgeon: Burnell Blanks, MD;  Location: [facility] CATH LAB;  Service: Cardiovascular;;  . CARDIAC CATHETERIZATION N/A 04/20/2015   Procedure: Left Heart Cath and Coronary Angiography;  Surgeon: Belva Crome, MD;  Location: [facility] CV LAB;  Service: Cardiovascular;  Laterality: N/A;  . CATARACT EXTRACTION W/ INTRAOCULAR LENS  IMPLANT, BILATERAL Bilateral 2013  . [place]; 1960  . CHOLECYSTECTOMY  1989  . CORONARY ANGIOPLASTY  12/2013   POBA - OM2  . CORONARY ARTERY BYPASS GRAFT N/A 04/25/2015   Procedure: CORONARY ARTERY BYPASS GRAFTING (CABG) x four,  using left  internal mammary artery and right leg greater saphenous vein harvested endoscopically;  Surgeon: Gaye Pollack, MD;  Location: [facility] OR;  Service: Open Heart Surgery;  Laterality: N/A;  . JOINT REPLACEMENT     right knee  . LEFT HEART CATHETERIZATION WITH CORONARY ANGIOGRAM N/A 01/22/2014   Procedure: LEFT HEART CATHETERIZATION WITH CORONARY ANGIOGRAM;  Surgeon: Sinclair Grooms, MD;  Location: [facility] CATH LAB;  Service: Cardiovascular;  Laterality: N/A;  . LEFT HEART CATHETERIZATION WITH CORONARY ANGIOGRAM N/A 02/17/2014   Procedure: LEFT HEART CATHETERIZATION WITH CORONARY ANGIOGRAM;  Surgeon: Burnell Blanks, MD;  Location: [facility] CATH LAB;  Service: Cardiovascular;  Laterality: N/A;  . LEFT HEART CATHETERIZATION WITH CORONARY ANGIOGRAM N/A 02/24/2014   Procedure: LEFT HEART CATHETERIZATION WITH CORONARY ANGIOGRAM;  Surgeon: Burnell Blanks, MD;  Location: [facility] CATH LAB;  Service: Cardiovascular;  Laterality: N/A;  . LUMBAR LAMINECTOMY/DECOMPRESSION MICRODISCECTOMY Left 12/22/2014   Procedure: CENTRAL DECOMPRESSIVE LUMBAR, AND LAMINECTOMY L5-S1,  S1-S2 FOR SPINAL STENOSIS FORAMINOTOMY L5, S1, S2 ROOTS ON LEFT FOR FORAMINAL STENOSIS, AND DECOMPRESSION L5-S1, S1-S2 ACCORDING TO LABELING OF [place] X-RAYS;  Surgeon: Latanya Maudlin, MD;  Location: [facility] ORS;  Service: Orthopedics;  Laterality: Left;  . PERCUTANEOUS CORONARY STENT INTERVENTION (PCI-S)  02/17/14   For restenosis of OM2 - PCI with Xience [place]pline DES 2.25 mm x 12 mm & 2.25 mm x 8 mm overlapping  . REDUCTION MAMMAPLASTY Bilateral 1990's  . SHOULDER OPEN ROTATOR CUFF REPAIR Bilateral 1980's - 1990's  . TEE WITHOUT CARDIOVERSION N/A 04/25/2015   Procedure: TRANSESOPHAGEAL ECHOCARDIOGRAM (TEE);  Surgeon: Gaye Pollack, MD;  Location: [facility];  Service: Open Heart Surgery;  Laterality: N/A;  . TOTAL KNEE ARTHROPLASTY Right 2009    Current Medications: Outpatient Medications Prior to Visit  Medication Sig Dispense Refill  .  allopurinol (ZYLOPRIM) 300 MG tablet Take 300 mg by mouth every morning.     Marland Kitchen aspirin EC 81 MG EC tablet Take 1 tablet (81 mg total) by mouth daily. 30 tablet 3  . Biotin 5000 MCG CAPS Take 1 capsule by mouth every morning.     . carvedilol (COREG) 6.25 MG tablet Take 6.25 mg by mouth 2 (two) times daily with a meal.    . clopidogrel (PLAVIX) 75 MG tablet Take 1 tablet (75 mg total) by mouth daily. 90 tablet 0  . colchicine 0.6 MG tablet Take 0.6 mg by mouth daily as needed (gout flare up.).     Marland Kitchen famotidine (PEPCID) 10 MG tablet Take 10 mg by mouth 2 (two) times daily.    . furosemide (LASIX) 80 MG tablet Take 80 mg by mouth daily.    Marland Kitchen gabapentin (NEURONTIN) 300 MG capsule Take 300 mg by mouth 2 (two) times daily.    . insulin NPH-regular Human (NOVOLIN 70/30) (70-30) 100 UNIT/ML injection Inject  25-32 Units into the skin daily.    . isosorbide mononitrate (IMDUR) 60 MG 24 hr tablet Take 1 tablet (60 mg total) by mouth daily. 90 tablet 3  . levothyroxine (SYNTHROID, LEVOTHROID) 50 MCG tablet Take 50 mcg by mouth every morning.     . nitroGLYCERIN (NITROSTAT) 0.4 MG SL tablet Place 0.4 mg under the tongue every 5 (five) minutes x 3 doses as needed for chest pain.    . ONE TOUCH ULTRA TEST test strip 1 each by Other route as needed for other.     . pantoprazole (PROTONIX) 40 MG tablet TAKE 1 TABLET BY MOUTH ONCE DAILY 30 tablet 10  . traMADol (ULTRAM) 50 MG tablet Take 1 tablet (50 mg total) by mouth every 6 (six) hours as needed. 15 tablet 0   No facility-administered medications prior to visit.      Allergies:   Contrast media [iodinated diagnostic agents]; Cortizone-10 [hydrocortisone]; Lipitor [atorvastatin]; and Zocor [simvastatin]   Social History   Socioeconomic History  . Marital status: Widowed    Spouse name: None  . Number of children: 2  . Years of education: None  . Highest education level: None  Social Needs  . Financial resource strain: None  . Food insecurity - worry:  None  . Food insecurity - inability: None  . Transportation needs - medical: None  . Transportation needs - non-medical: None  Occupational History  . Occupation: Retired  Tobacco Use  . Smoking status: Never Smoker  . Smokeless tobacco: Never Used  Substance and Sexual Activity  . Alcohol use: No  . Drug use: No  . Sexual activity: No  Other Topics Concern  . None  Social History Narrative   Lives in [place] by herself.      Family History:  The patient's family history includes Cancer in her sister; Diabetes in her brother and mother; Emphysema in her father; Heart attack (age of onset: 60) in her mother; Heart disease in her brother and father; Hypertension in her mother.   ROS:   Please see the history of present illness.    Decreased memory.  Shortness of breath with exertion.  Concerned about swelling in her legs.  Does not notice swelling in hands or face. All other systems reviewed and are negative.   PHYSICAL EXAM:   VS:  BP 126/64   Pulse 70   Ht 5' 7.5" (1.715 m)   Wt 225 lb (102.1 kg)   BMI 34.72 kg/m    GEN: Well nourished, well developed, in no acute distress  HEENT: normal  Neck: no JVD, carotid bruits, or masses Cardiac: RRR; no murmurs, rubs, or gallops,no edema  Respiratory:  clear to auscultation bilaterally, normal work of breathing GI: soft, nontender, nondistended, + BS MS: no deformity or atrophy  Skin: warm and dry, no rash Neuro:  Alert and Oriented x 3, Strength and sensation are intact Psych: euthymic mood, full affect  Wt Readings from Last 3 Encounters:  07/22/17 225 lb (102.1 kg)  06/07/17 216 lb 9.6 oz (98.2 kg)  04/24/17 214 lb (97.1 kg)      Studies/Labs Reviewed:   EKG:  EKG not repeated  Recent Labs: 04/24/2017: BUN 51; Creatinine, Ser 1.77; Hemoglobin 12.5; Platelets 229; Potassium 4.8; Sodium 133   Lipid Panel    Component Value Date/Time   CHOL 176 07/26/2014 1115   TRIG 195.0 (H) 07/26/2014 1115   HDL 42.90  07/26/2014 1115   CHOLHDL 4 07/26/2014 1115   VLDL  39.0 07/26/2014 1115   LDLCALC 94 07/26/2014 1115    Additional studies/ records that were reviewed today include:  No new data    ASSESSMENT:    1. Coronary artery disease involving coronary bypass graft of native heart with angina pectoris ([place])   2. Essential hypertension   3. Chronic combined systolic and diastolic heart failure ([place])   4. CKD stage 3 due to type 2 diabetes mellitus ([place])   5. Hyperlipidemia with target LDL less than 70      PLAN:  In order of problems listed above:  1. Stable CAD.  Burning chest discomfort is probably not ischemic.  She is currently stable and therefore will simply observe over time.  If symptoms worsen she is instructed to contact us. 2. Excellent current control. 3. Neck veins are flat lungs are clear.  There is no evidence of pulmonary congestion.  Lower extremity edema is felt to be related to venous insufficiency.  I have recommended compliance with lower extremity compression stockings.  I have advised against taking extra doses of furosemide. 4. Not addressed 5. Not addressed  She should have clinical follow-up in 6-9 months.  Compliance with compression stockings as advised.    Medication Adjustments/Labs and Tests Ordered: Current medicines are reviewed at length with the patient today.  Concerns regarding medicines are outlined above.  Medication changes, Labs and Tests ordered today are listed in the Patient Instructions below. There are no Patient Instructions on file for this visit.   Signed, Sinclair Grooms, MD  07/22/2017 10:18 AM    [facility] [address], [place], [place]  [address] Phone: [phone]; Fax: [phone]

## 2017-07-22 ENCOUNTER — Encounter: Payer: Self-pay | Admitting: Interventional Cardiology

## 2017-07-22 ENCOUNTER — Ambulatory Visit: Payer: Medicare Other | Admitting: Interventional Cardiology

## 2017-07-22 VITALS — BP 126/64 | HR 70 | Ht 67.5 in | Wt 225.0 lb

## 2017-07-22 DIAGNOSIS — I10 Essential (primary) hypertension: Secondary | ICD-10-CM | POA: Diagnosis not present

## 2017-07-22 DIAGNOSIS — E1122 Type 2 diabetes mellitus with diabetic chronic kidney disease: Secondary | ICD-10-CM

## 2017-07-22 DIAGNOSIS — I5042 Chronic combined systolic (congestive) and diastolic (congestive) heart failure: Secondary | ICD-10-CM | POA: Diagnosis not present

## 2017-07-22 DIAGNOSIS — N183 Chronic kidney disease, stage 3 (moderate): Secondary | ICD-10-CM

## 2017-07-22 DIAGNOSIS — I25709 Atherosclerosis of coronary artery bypass graft(s), unspecified, with unspecified angina pectoris: Secondary | ICD-10-CM

## 2017-07-22 DIAGNOSIS — E785 Hyperlipidemia, unspecified: Secondary | ICD-10-CM

## 2017-07-22 NOTE — Patient Instructions (Signed)
Medication Instructions:  Your physician recommends that you continue on your current medications as directed. Please refer to the Current Medication list given to you today.  Labwork: None  Testing/Procedures: None  Follow-Up: Your physician wants you to follow-up in: 6-9 months with Dr. Tamala Julian. You will receive a reminder letter in the mail two months in advance. If you don't receive a letter, please call our office to schedule the follow-up appointment.   Any Other Special Instructions Will Be Listed Below (If Applicable).  Your physician recommends that you wear your compression hose as much as possible.  He asks that you do NOT take an extra fluid pill when you have swelling.    If you need a refill on your cardiac medications before your next appointment, please call your pharmacy.

## 2017-08-01 DIAGNOSIS — H3582 Retinal ischemia: Secondary | ICD-10-CM | POA: Diagnosis not present

## 2017-08-01 DIAGNOSIS — H4312 Vitreous hemorrhage, left eye: Secondary | ICD-10-CM | POA: Diagnosis not present

## 2017-08-01 DIAGNOSIS — E113411 Type 2 diabetes mellitus with severe nonproliferative diabetic retinopathy with macular edema, right eye: Secondary | ICD-10-CM | POA: Diagnosis not present

## 2017-08-01 DIAGNOSIS — E113592 Type 2 diabetes mellitus with proliferative diabetic retinopathy without macular edema, left eye: Secondary | ICD-10-CM | POA: Diagnosis not present

## 2017-08-15 DIAGNOSIS — E113411 Type 2 diabetes mellitus with severe nonproliferative diabetic retinopathy with macular edema, right eye: Secondary | ICD-10-CM | POA: Diagnosis not present

## 2017-08-15 DIAGNOSIS — E113592 Type 2 diabetes mellitus with proliferative diabetic retinopathy without macular edema, left eye: Secondary | ICD-10-CM | POA: Diagnosis not present

## 2017-09-12 DIAGNOSIS — E113592 Type 2 diabetes mellitus with proliferative diabetic retinopathy without macular edema, left eye: Secondary | ICD-10-CM | POA: Diagnosis not present

## 2017-09-12 DIAGNOSIS — H4312 Vitreous hemorrhage, left eye: Secondary | ICD-10-CM | POA: Diagnosis not present

## 2017-09-12 DIAGNOSIS — H3582 Retinal ischemia: Secondary | ICD-10-CM | POA: Diagnosis not present

## 2017-09-12 DIAGNOSIS — E113411 Type 2 diabetes mellitus with severe nonproliferative diabetic retinopathy with macular edema, right eye: Secondary | ICD-10-CM | POA: Diagnosis not present

## 2017-09-25 DIAGNOSIS — Z79899 Other long term (current) drug therapy: Secondary | ICD-10-CM | POA: Diagnosis not present

## 2017-09-25 DIAGNOSIS — E039 Hypothyroidism, unspecified: Secondary | ICD-10-CM | POA: Diagnosis not present

## 2017-09-25 DIAGNOSIS — E119 Type 2 diabetes mellitus without complications: Secondary | ICD-10-CM | POA: Diagnosis not present

## 2017-09-25 DIAGNOSIS — Z0000 Encounter for general adult medical examination without abnormal findings: Secondary | ICD-10-CM | POA: Diagnosis not present

## 2017-09-25 DIAGNOSIS — Z794 Long term (current) use of insulin: Secondary | ICD-10-CM | POA: Diagnosis not present

## 2017-10-14 DIAGNOSIS — H4312 Vitreous hemorrhage, left eye: Secondary | ICD-10-CM | POA: Diagnosis not present

## 2017-10-14 DIAGNOSIS — E113592 Type 2 diabetes mellitus with proliferative diabetic retinopathy without macular edema, left eye: Secondary | ICD-10-CM | POA: Diagnosis not present

## 2017-10-14 DIAGNOSIS — H3582 Retinal ischemia: Secondary | ICD-10-CM | POA: Diagnosis not present

## 2017-10-14 DIAGNOSIS — E113411 Type 2 diabetes mellitus with severe nonproliferative diabetic retinopathy with macular edema, right eye: Secondary | ICD-10-CM | POA: Diagnosis not present

## 2017-10-29 DIAGNOSIS — M25562 Pain in left knee: Secondary | ICD-10-CM | POA: Diagnosis not present

## 2017-10-29 DIAGNOSIS — M1712 Unilateral primary osteoarthritis, left knee: Secondary | ICD-10-CM | POA: Diagnosis not present

## 2017-10-29 DIAGNOSIS — G8929 Other chronic pain: Secondary | ICD-10-CM | POA: Diagnosis not present

## 2017-10-31 DIAGNOSIS — M25562 Pain in left knee: Secondary | ICD-10-CM | POA: Diagnosis not present

## 2017-11-04 DIAGNOSIS — E113411 Type 2 diabetes mellitus with severe nonproliferative diabetic retinopathy with macular edema, right eye: Secondary | ICD-10-CM | POA: Diagnosis not present

## 2017-11-25 DIAGNOSIS — H3582 Retinal ischemia: Secondary | ICD-10-CM | POA: Diagnosis not present

## 2017-11-25 DIAGNOSIS — E113411 Type 2 diabetes mellitus with severe nonproliferative diabetic retinopathy with macular edema, right eye: Secondary | ICD-10-CM | POA: Diagnosis not present

## 2017-11-25 DIAGNOSIS — H4312 Vitreous hemorrhage, left eye: Secondary | ICD-10-CM | POA: Diagnosis not present

## 2017-11-25 DIAGNOSIS — E113592 Type 2 diabetes mellitus with proliferative diabetic retinopathy without macular edema, left eye: Secondary | ICD-10-CM | POA: Diagnosis not present

## 2017-11-26 DIAGNOSIS — G8929 Other chronic pain: Secondary | ICD-10-CM | POA: Diagnosis not present

## 2017-11-26 DIAGNOSIS — M21062 Valgus deformity, not elsewhere classified, left knee: Secondary | ICD-10-CM | POA: Diagnosis not present

## 2017-11-26 DIAGNOSIS — M1712 Unilateral primary osteoarthritis, left knee: Secondary | ICD-10-CM | POA: Diagnosis not present

## 2017-11-28 ENCOUNTER — Telehealth: Payer: Self-pay | Admitting: *Deleted

## 2017-11-28 NOTE — Telephone Encounter (Signed)
     [facility] Pre-operative Risk Assessment    Request for surgical clearance:  1. What type of surgery is being performed? LEFT TOTAL KNEE ARTHROPLASTY    2. When is this surgery scheduled? TBD    3. Are there any medications that need to be held prior to surgery and how long? PT IS DIABETIC    4. Practice name and name of physician performing surgery? [facility]   5. What is your office phone and fax number? [place] # [phone]; FAX # [phone]   6. Anesthesia type (None, local, MAC, general) ? SPINAL    Julaine Hua 11/28/2017, 4:03 PM  _________________________________________________________________   (provider comments below)

## 2017-11-29 NOTE — Telephone Encounter (Signed)
Dr Tamala Julian can you comment on holding Plavix on this patient prior to TKR.  Kerin Ransom PA-C 11/29/2017 12:07 PM

## 2017-12-01 NOTE — Telephone Encounter (Signed)
It would be okay to hold for 5 days prior. She probably does not need plavix any more. Needs aspirin only if no allergy.

## 2017-12-02 NOTE — Telephone Encounter (Signed)
Phone call placed in reference to cardiac clearance. Pt instructed to call preop clinic back to determine if she can be cleared over the phone of will need office visit. Cardiac Clearance pending.

## 2017-12-02 NOTE — Telephone Encounter (Signed)
   Primary Cardiologist: Sinclair Grooms, MD  Chart reviewed as part of pre-operative protocol coverage. Patient was contacted 12/02/2017 in reference to pre-operative risk assessment for pending surgery as outlined below.  Carol [name] was last seen on 07/22/17 by Tamala Julian.  Since that day, Carol [name] has done well with no change in her normal baseline. She has chronic dyspnea, which she reports his her baseline. Not any worse. She denies CP. She is able to ambulate a flight of stairs w/o CP.   Therefore, based on ACC/AHA guidelines, the patient would be at acceptable risk for the planned procedure without further cardiovascular testing.   Per Dr. Tamala Julian, it is ok for pt to hold Plavix for 5 days prior to procedure.   I will route this recommendation to the requesting party via Epic fax function and remove from pre-op pool.  Please call with questions.  Lyda Jester, PA-C 12/02/2017, 4:08 PM

## 2017-12-05 DIAGNOSIS — T162XXA Foreign body in left ear, initial encounter: Secondary | ICD-10-CM | POA: Diagnosis not present

## 2017-12-09 NOTE — Telephone Encounter (Signed)
Dr Tamala Julian could pt stop plavix for 5 days?  plese send note back to [facility] preop

## 2017-12-11 NOTE — Telephone Encounter (Signed)
   Primary Cardiologist: Sinclair Grooms, MD  Chart reviewed as part of pre-operative protocol coverage. Patient was contacted 12/02/2017 in reference to pre-operative risk assessment for pending surgery as outlined below.  Carol [name] was last seen on 07/22/17 by Tamala Julian.  Since that day, Carol [name] has done well with no change in her normal baseline. She has chronic dyspnea, which she reports his her baseline. Not any worse. She denies CP. She is able to ambulate a flight of stairs w/o CP.   Therefore, based on ACC/AHA guidelines, the patient would be at acceptable risk for the planned procedure without further cardiovascular testing.   Per Dr. Tamala Julian, it is ok for pt to hold Plavix for 5 days prior to procedure.   I will route this recommendation to the requesting party via Epic fax function and remove from pre-op pool.  Please call with questions.  Lyda Jester, PA-C 12/02/2017, 4:08 PM

## 2017-12-30 DIAGNOSIS — Z794 Long term (current) use of insulin: Secondary | ICD-10-CM | POA: Diagnosis not present

## 2017-12-30 DIAGNOSIS — Z1339 Encounter for screening examination for other mental health and behavioral disorders: Secondary | ICD-10-CM | POA: Diagnosis not present

## 2017-12-30 DIAGNOSIS — E119 Type 2 diabetes mellitus without complications: Secondary | ICD-10-CM | POA: Diagnosis not present

## 2018-01-06 DIAGNOSIS — E113592 Type 2 diabetes mellitus with proliferative diabetic retinopathy without macular edema, left eye: Secondary | ICD-10-CM | POA: Diagnosis not present

## 2018-03-06 ENCOUNTER — Encounter: Payer: Self-pay | Admitting: Interventional Cardiology

## 2018-03-10 DIAGNOSIS — H3582 Retinal ischemia: Secondary | ICD-10-CM | POA: Diagnosis not present

## 2018-03-10 DIAGNOSIS — H4312 Vitreous hemorrhage, left eye: Secondary | ICD-10-CM | POA: Diagnosis not present

## 2018-03-10 DIAGNOSIS — E113491 Type 2 diabetes mellitus with severe nonproliferative diabetic retinopathy without macular edema, right eye: Secondary | ICD-10-CM | POA: Diagnosis not present

## 2018-03-10 DIAGNOSIS — E113592 Type 2 diabetes mellitus with proliferative diabetic retinopathy without macular edema, left eye: Secondary | ICD-10-CM | POA: Diagnosis not present

## 2018-03-17 ENCOUNTER — Other Ambulatory Visit: Payer: Self-pay | Admitting: Orthopedic Surgery

## 2018-03-18 NOTE — Progress Notes (Signed)
Cardiology Office Note:    Date:  03/19/2018   ID:  Carol [name], DOB Aug 02, 1935, MRN [id number]  PCP:  Lillard Anes, MD  Cardiologist:  Sinclair Grooms, MD   Referring MD: Lillard Anes,*   Chief Complaint  Patient presents with  . Coronary Artery Disease  . Shortness of Breath    History of Present Illness:    Carol [name] is a 82 y.o. female with a hx of with history of DM, HTN, asthma, hyperthyroidism, HLD,, CKD stage III, left bundle branch block, coronary artery disease with CABG October 2016 with LIMA to LAD, sequential SVG to OM 2 and 3, and SVG to RCA, chronic diastolic systolic heart failure with LVEF improved from 25% to 50%, comes in today as a new patient for longitudinal cardiac management.  Plan is to have left knee replacement surgery by Dr. Lorre Nick in mid October.  She requests cardiac clearance for the surgical procedure.  If she exerts herself too strenuously she will develop some burning discomfort in the chest.  This is been the case since surgery in 2016.  She has had no episodes of spontaneous chest discomfort.  She denies orthopnea, PND, lower extremity swelling, syncope, prolonged palpitations, or claudication.   Past Medical History:  Diagnosis Date  . Anemia    a. Noted on labs 02/2014 possibly procedurally related.  . Asthma   . CAD S/P percutaneous coronary angioplasty 01/22/2014   A. (12/2013): POBA - OM2 99% (very tortuous segment) - reduced ~ 50% & TIMI 3 flow, 80-90% stenosis in mid PDA, Irregularities <50% in mRCA, pLAD and prox LCx; b. 02/17/14: Moderate sized fixed defect in Lateral wall --> cath with OM2 restenosis & otherwise stable --> Xience Alpine DES x 2 (2.25 mm x 12 & 8 mm). c. NSTEMI 02/2014 s/p PTCA/balloon angioplasty only to small caliber PDA.  . Carotid stenosis    Carotid US 11/17: L 1-39; FU prn  . Chronic systolic heart failure ([place])    a. ICM b. ECHO (01/2014): EF 25-30%, grade I DD, trivial MR. c. EF by  Myoview 02/17/14 = ~53%. d. EF by cath 02/24/14 - improved to 50-55%.  . CKD (chronic kidney disease), stage III ([place])   . Contrast media allergy   . Environmental allergies   . GERD (gastroesophageal reflux disease)   . Gout, unspecified   . Hyperlipidemia    a. H/o intolerance to Zocor, Lipitor. Had muscle aches on higher dose Crestor.  . Hypertension   . Hypothyroidism   . LBBB (left bundle branch block)    a. Transient during 02/2014 admission.  . NSTEMI (non-ST elevated myocardial infarction) ([place]) 2014; 12/2013, 01/2014  . QT prolongation    a. Noted on EKG 02/2014.  Marland Kitchen Sinus bradycardia    a. HR 40s-50s during 02/2014 admission, limiting BB dose.  Marland Kitchen TIA (transient ischemic attack) 1990's   a. TIA vs stroke 1990's "mild" - sounds like a TIA as she was told she had stroke symptoms but negative scans. Denies residual effects.  . Type II diabetes mellitus ([place])     Past Surgical History:  Procedure Laterality Date  . ABDOMINAL HYSTERECTOMY     complete  . APPENDECTOMY  1959  . CARDIAC CATHETERIZATION  02/24/2014   Procedure: CORONARY BALLOON ANGIOPLASTY;  Surgeon: Burnell Blanks, MD;  Location: [facility] CATH LAB;  Service: Cardiovascular;;  . CARDIAC CATHETERIZATION N/A 04/20/2015   Procedure: Left Heart Cath and Coronary Angiography;  Surgeon: Lynnell Dike  Tamala Julian, MD;  Location: [facility] CV LAB;  Service: Cardiovascular;  Laterality: N/A;  . CATARACT EXTRACTION W/ INTRAOCULAR LENS  IMPLANT, BILATERAL Bilateral 2013  . [place]; 1960  . CHOLECYSTECTOMY  1989  . CORONARY ANGIOPLASTY  12/2013   POBA - OM2  . CORONARY ARTERY BYPASS GRAFT N/A 04/25/2015   Procedure: CORONARY ARTERY BYPASS GRAFTING (CABG) x four,  using left internal mammary artery and right leg greater saphenous vein harvested endoscopically;  Surgeon: Gaye Pollack, MD;  Location: [facility] OR;  Service: Open Heart Surgery;  Laterality: N/A;  . JOINT REPLACEMENT     right knee  . LEFT HEART CATHETERIZATION WITH  CORONARY ANGIOGRAM N/A 01/22/2014   Procedure: LEFT HEART CATHETERIZATION WITH CORONARY ANGIOGRAM;  Surgeon: Sinclair Grooms, MD;  Location: [facility] CATH LAB;  Service: Cardiovascular;  Laterality: N/A;  . LEFT HEART CATHETERIZATION WITH CORONARY ANGIOGRAM N/A 02/17/2014   Procedure: LEFT HEART CATHETERIZATION WITH CORONARY ANGIOGRAM;  Surgeon: Burnell Blanks, MD;  Location: [facility] CATH LAB;  Service: Cardiovascular;  Laterality: N/A;  . LEFT HEART CATHETERIZATION WITH CORONARY ANGIOGRAM N/A 02/24/2014   Procedure: LEFT HEART CATHETERIZATION WITH CORONARY ANGIOGRAM;  Surgeon: Burnell Blanks, MD;  Location: [facility] CATH LAB;  Service: Cardiovascular;  Laterality: N/A;  . LUMBAR LAMINECTOMY/DECOMPRESSION MICRODISCECTOMY Left 12/22/2014   Procedure: CENTRAL DECOMPRESSIVE LUMBAR, AND LAMINECTOMY L5-S1,  S1-S2 FOR SPINAL STENOSIS FORAMINOTOMY L5, S1, S2 ROOTS ON LEFT FOR FORAMINAL STENOSIS, AND DECOMPRESSION L5-S1, S1-S2 ACCORDING TO LABELING OF [place] X-RAYS;  Surgeon: Latanya Maudlin, MD;  Location: [facility] ORS;  Service: Orthopedics;  Laterality: Left;  . PERCUTANEOUS CORONARY STENT INTERVENTION (PCI-S)  02/17/14   For restenosis of OM2 - PCI with Xience Apline DES 2.25 mm x 12 mm & 2.25 mm x 8 mm overlapping  . REDUCTION MAMMAPLASTY Bilateral 1990's  . SHOULDER OPEN ROTATOR CUFF REPAIR Bilateral 1980's - 1990's  . TEE WITHOUT CARDIOVERSION N/A 04/25/2015   Procedure: TRANSESOPHAGEAL ECHOCARDIOGRAM (TEE);  Surgeon: Gaye Pollack, MD;  Location: [place];  Service: Open Heart Surgery;  Laterality: N/A;  . TOTAL KNEE ARTHROPLASTY Right 2009    Current Medications: Current Meds  Medication Sig  . allopurinol (ZYLOPRIM) 300 MG tablet Take 300 mg by mouth every morning.   Marland Kitchen aspirin EC 81 MG EC tablet Take 1 tablet (81 mg total) by mouth daily.  . Biotin 5000 MCG CAPS Take 1 capsule by mouth every morning.   . carvedilol (COREG) 6.25 MG tablet Take 6.25 mg by mouth 2 (two) times daily with a meal.   . clopidogrel (PLAVIX) 75 MG tablet Take 1 tablet (75 mg total) by mouth daily.  . colchicine 0.6 MG tablet Take 0.6 mg by mouth daily as needed (gout flare up.).   Marland Kitchen famotidine (PEPCID) 10 MG tablet Take 10 mg by mouth 2 (two) times daily.  . furosemide (LASIX) 80 MG tablet Take 80 mg by mouth daily.  Marland Kitchen gabapentin (NEURONTIN) 300 MG capsule Take 300 mg by mouth 2 (two) times daily.  . insulin NPH-regular Human (NOVOLIN 70/30) (70-30) 100 UNIT/ML injection Inject 25-32 Units into the skin daily.  . isosorbide mononitrate (IMDUR) 60 MG 24 hr tablet Take 1 tablet (60 mg total) by mouth daily.  Marland Kitchen levothyroxine (SYNTHROID, LEVOTHROID) 50 MCG tablet Take 50 mcg by mouth every morning.   . ONE TOUCH ULTRA TEST test strip 1 each by Other route as needed for other.   . pantoprazole (PROTONIX) 40 MG tablet TAKE 1  TABLET BY MOUTH ONCE DAILY  . traMADol (ULTRAM) 50 MG tablet Take 1 tablet (50 mg total) by mouth every 6 (six) hours as needed.     Allergies:   Contrast media [iodinated diagnostic agents]; Cortizone-10 [hydrocortisone]; Lipitor [atorvastatin]; and Zocor [simvastatin]   Social History   Socioeconomic History  . Marital status: Widowed    Spouse name: Not on file  . Number of children: 2  . Years of education: Not on file  . Highest education level: Not on file  Occupational History  . Occupation: Retired  Scientific laboratory technician  . Financial resource strain: Not on file  . Food insecurity:    Worry: Not on file    Inability: Not on file  . Transportation needs:    Medical: Not on file    Non-medical: Not on file  Tobacco Use  . Smoking status: Never Smoker  . Smokeless tobacco: Never Used  Substance and Sexual Activity  . Alcohol use: No  . Drug use: No  . Sexual activity: Never  Lifestyle  . Physical activity:    Days per week: Not on file    Minutes per session: Not on file  . Stress: Not on file  Relationships  . Social connections:    Talks on phone: Not on file    Gets  together: Not on file    Attends religious service: Not on file    Active member of club or organization: Not on file    Attends meetings of clubs or organizations: Not on file    Relationship status: Not on file  Other Topics Concern  . Not on file  Social History Narrative   Lives in [place] by herself.      Family History: The patient's family history includes Cancer in her sister; Diabetes in her brother and mother; Emphysema in her father; Heart attack (age of onset: 98) in her mother; Heart disease in her brother and father; Hypertension in her mother. There is no history of Stroke.  ROS:   Please see the history of present illness.    Occasional blood in her urine, diarrhea, nausea vomiting.  She is unable to get in and out of her car without difficulty.  [place] and injured her right knee which is post knee replacement multiple years ago.  She did not lose consciousness or faint.  Complains of snoring, wheezing, hearing loss, back pain, muscle pain, dizziness, easy bruising.  All other systems reviewed and are negative.  EKGs/Labs/Other Studies Reviewed:    The following studies were reviewed today: None  EKG:  EKG is ordered today.  The ekg ordered today demonstrates low voltage, normal sinus rhythm, first-degree AV block, and unchanged when compared to prior tracing done most recently  Recent Labs: 04/24/2017: BUN 51; Creatinine, Ser 1.77; Hemoglobin 12.5; Platelets 229; Potassium 4.8; Sodium 133  Recent Lipid Panel    Component Value Date/Time   CHOL 176 07/26/2014 1115   TRIG 195.0 (H) 07/26/2014 1115   HDL 42.90 07/26/2014 1115   CHOLHDL 4 07/26/2014 1115   VLDL 39.0 07/26/2014 1115   LDLCALC 94 07/26/2014 1115    Physical Exam:    VS:  BP 136/64   Pulse 66   Ht 5' 7.5" (1.715 m)   Wt 225 lb 6.4 oz (102.2 kg)   BMI 34.78 kg/m     Wt Readings from Last 3 Encounters:  03/19/18 225 lb 6.4 oz (102.2 kg)  07/22/17 225 lb (102.1 kg)  06/07/17 216 lb 9.6  oz (98.2  kg)     GEN:  Well nourished, well developed in no acute distress HEENT: Normal NECK: No JVD. LYMPHATICS: No lymphadenopathy CARDIAC: RRR, no murmur, no gallop, no edema. VASCULAR: 2+ bilateral radial pulses.  No bruits. RESPIRATORY:  Clear to auscultation without rales, wheezing or rhonchi  ABDOMEN: Soft, non-tender, non-distended, No pulsatile mass, MUSCULOSKELETAL: No deformity  SKIN: Warm and dry NEUROLOGIC:  Alert and oriented x 3 PSYCHIATRIC:  Normal affect   ASSESSMENT:    1. Coronary artery disease involving coronary bypass graft of native heart with angina pectoris ([place])   2. Chronic combined systolic and diastolic heart failure ([place])   3. Preoperative cardiovascular examination   4. Hyperlipidemia with target LDL less than 70   5. Ischemic cardiomyopathy - with near resolution of LVEF post MI   6. CKD stage 3 due to type 2 diabetes mellitus ([place])    PLAN:    In order of problems listed above:  1. Stable angina unchanged since coronary bypass grafting less than 3 years ago.  No requirement for nitroglycerin.  Gets mild burning in her chest if heavy physical activity or if she walks too far.  She is ambulatory and independent although hampered significantly by left knee arthritis. 2. No evidence of volume overload.  Most recent LVEF of 60% in October 2016 (improved from 30%). 3. The patient is stable from cardiac standpoint and is cleared for the upcoming left knee replacement surgery by Dr. Lorre Nick.  No further cardiac evaluation is needed at this time. 4. LDL target going forward should be less than 70. 5. Resolved after revascularization with current LVEF approximately 60% when last assessed in October 2016.  All medical problems including the left and right knee make the patient significantly impaired with reference to independence.  She was given a permanent handicap sticker.  She is cleared for upcoming left knee replacement surgery.   Medication Adjustments/Labs  and Tests Ordered: Current medicines are reviewed at length with the patient today.  Concerns regarding medicines are outlined above.  Orders Placed This Encounter  Procedures  . EKG 12-Lead   No orders of the defined types were placed in this encounter.   Patient Instructions  Medication Instructions:  Your physician recommends that you continue on your current medications as directed. Please refer to the Current Medication list given to you today.   Labwork: None ordered  Testing/Procedures: None ordered  Follow-Up: Your physician wants you to follow-up in: 1 year with Dr. Tamala Julian. You will receive a reminder letter in the mail two months in advance. If you don't receive a letter, please call our office to schedule the follow-up appointment.   Any Other Special Instructions Will Be Listed Below (If Applicable).     If you need a refill on your cardiac medications before your next appointment, please call your pharmacy.      Signed, Sinclair Grooms, MD  03/19/2018 4:52 PM    [facility]

## 2018-03-19 ENCOUNTER — Ambulatory Visit: Payer: Medicare Other | Admitting: Interventional Cardiology

## 2018-03-19 ENCOUNTER — Encounter: Payer: Self-pay | Admitting: Interventional Cardiology

## 2018-03-19 VITALS — BP 136/64 | HR 66 | Ht 67.5 in | Wt 225.4 lb

## 2018-03-19 DIAGNOSIS — I25709 Atherosclerosis of coronary artery bypass graft(s), unspecified, with unspecified angina pectoris: Secondary | ICD-10-CM | POA: Diagnosis not present

## 2018-03-19 DIAGNOSIS — I5042 Chronic combined systolic (congestive) and diastolic (congestive) heart failure: Secondary | ICD-10-CM

## 2018-03-19 DIAGNOSIS — Z01810 Encounter for preprocedural cardiovascular examination: Secondary | ICD-10-CM | POA: Diagnosis not present

## 2018-03-19 DIAGNOSIS — I255 Ischemic cardiomyopathy: Secondary | ICD-10-CM

## 2018-03-19 DIAGNOSIS — N183 Chronic kidney disease, stage 3 unspecified: Secondary | ICD-10-CM

## 2018-03-19 DIAGNOSIS — E785 Hyperlipidemia, unspecified: Secondary | ICD-10-CM

## 2018-03-19 DIAGNOSIS — E1122 Type 2 diabetes mellitus with diabetic chronic kidney disease: Secondary | ICD-10-CM

## 2018-03-19 NOTE — Patient Instructions (Signed)

## 2018-03-24 DIAGNOSIS — K219 Gastro-esophageal reflux disease without esophagitis: Secondary | ICD-10-CM | POA: Diagnosis not present

## 2018-03-24 DIAGNOSIS — E119 Type 2 diabetes mellitus without complications: Secondary | ICD-10-CM | POA: Diagnosis not present

## 2018-03-24 DIAGNOSIS — M109 Gout, unspecified: Secondary | ICD-10-CM | POA: Diagnosis not present

## 2018-03-24 DIAGNOSIS — E785 Hyperlipidemia, unspecified: Secondary | ICD-10-CM | POA: Diagnosis not present

## 2018-03-31 DIAGNOSIS — E119 Type 2 diabetes mellitus without complications: Secondary | ICD-10-CM | POA: Diagnosis not present

## 2018-03-31 DIAGNOSIS — Z79899 Other long term (current) drug therapy: Secondary | ICD-10-CM | POA: Diagnosis not present

## 2018-03-31 DIAGNOSIS — M109 Gout, unspecified: Secondary | ICD-10-CM | POA: Diagnosis not present

## 2018-03-31 DIAGNOSIS — Z794 Long term (current) use of insulin: Secondary | ICD-10-CM | POA: Diagnosis not present

## 2018-03-31 DIAGNOSIS — E039 Hypothyroidism, unspecified: Secondary | ICD-10-CM | POA: Diagnosis not present

## 2018-04-11 NOTE — Patient Instructions (Addendum)
Carol [name]  04/11/2018   Your procedure is scheduled on: 04-21-18   Report to [facility] Main  Entrance    Report to admitting at 10:30AM    Call this number if you have problems the morning of surgery [phone]     Remember: Do not eat food After Midnight. YOU MAY HAVE CLEAR LIQUIDS FROM MIDNIGHT UNTIL 7:00AM. NOTHING BY MOUTH AFTER 7:00AM! BRUSH YOUR TEETH MORNING OF SURGERY AND RINSE YOUR MOUTH OUT, NO CHEWING GUM CANDY OR MINTS.     CLEAR LIQUID DIET   Foods Allowed                                                                     Foods Excluded  Coffee and tea, regular and decaf                             liquids that you cannot  Plain Jell-O in any flavor                                             see through such as: Fruit ices (not with fruit pulp)                                     milk, soups, orange juice  Iced Popsicles                                    All solid food Carbonated beverages, regular and diet                                    Cranberry, grape and apple juices Sports drinks like Gatorade Lightly seasoned clear broth or consume(fat free) Sugar, honey syrup  Sample Menu Breakfast                                Lunch                                     Supper Cranberry juice                    Beef broth                            Chicken broth Jell-O                                     Grape juice  Apple juice Coffee or tea                        Jell-O                                      Popsicle                                                Coffee or tea                        Coffee or tea  _____________________________________________________________________       Take these medicines the morning of surgery with A SIP OF WATER: ALLO[URINOL, CARVEDILOL, PEPCID IF NEEDED, GABAPENTIN, ISOSORBIDE, LEVOTHYROXINE, PANTOPRAZOLE    FOR DIABETES: PLEASE ASK YOUR PRIMARY CARE PROVIDER  AND/OR ENDOCRINOLOGIST FOR INSTRUCTIONS FOR YOUR NOVOLIN 70/20 INSULIN!!!                                 You may not have any metal on your body including hair pins and              piercings  Do not wear jewelry, make-up, lotions, powders or perfumes, deodorant             Do not wear nail polish.  Do not shave  48 hours prior to surgery.               Do not bring valuables to the hospital. [place].  Contacts, dentures or bridgework may not be worn into surgery.  Leave suitcase in the car. After surgery it may be brought to your room.                  Please read over the following fact sheets you were given: _____________________________________________________________________             [facility] - Preparing for Surgery Before surgery, you can play an important role.  Because skin is not sterile, your skin needs to be as free of germs as possible.  You can reduce the number of germs on your skin by washing with CHG (chlorahexidine gluconate) soap before surgery.  CHG is an antiseptic cleaner which kills germs and bonds with the skin to continue killing germs even after washing. Please DO NOT use if you have an allergy to CHG or antibacterial soaps.  If your skin becomes reddened/irritated stop using the CHG and inform your nurse when you arrive at Short Stay. Do not shave (including legs and underarms) for at least 48 hours prior to the first CHG shower.  You may shave your face/neck. Please follow these instructions carefully:  1.  Shower with CHG Soap the night before surgery and the  morning of Surgery.  2.  If you choose to wash your hair, wash your hair first as usual with your  normal  shampoo.  3.  After you shampoo, rinse your hair and body thoroughly to remove the  shampoo.  4.  Use CHG as you would any other liquid soap.  You can apply chg directly  to the skin and wash                        Gently with a scrungie or clean washcloth.  5.  Apply the CHG Soap to your body ONLY FROM THE NECK DOWN.   Do not use on face/ open                           Wound or open sores. Avoid contact with eyes, ears mouth and genitals (private parts).                       Wash face,  Genitals (private parts) with your normal soap.             6.  Wash thoroughly, paying special attention to the area where your surgery  will be performed.  7.  Thoroughly rinse your body with warm water from the neck down.  8.  DO NOT shower/wash with your normal soap after using and rinsing off  the CHG Soap.                9.  Pat yourself dry with a clean towel.            10.  Wear clean pajamas.            11.  Place clean sheets on your bed the night of your first shower and do not  sleep with pets. Day of Surgery : Do not apply any lotions/deodorants the morning of surgery.  Please wear clean clothes to the hospital/surgery center.  FAILURE TO FOLLOW THESE INSTRUCTIONS MAY RESULT IN THE CANCELLATION OF YOUR SURGERY PATIENT SIGNATURE_________________________________  NURSE SIGNATURE__________________________________  ________________________________________________________________________   Carol [name]  An incentive spirometer is a tool that can help keep your lungs clear and active. This tool measures how well you are filling your lungs with each breath. Taking long deep breaths may help reverse or decrease the chance of developing breathing (pulmonary) problems (especially infection) following:  A long period of time when you are unable to move or be active. BEFORE THE PROCEDURE   If the spirometer includes an indicator to show your best effort, your nurse or respiratory therapist will set it to a desired goal.  If possible, sit up straight or lean slightly forward. Try not to slouch.  Hold the incentive spirometer in an upright position. INSTRUCTIONS FOR USE  1. Sit on the edge of your bed  if possible, or sit up as far as you can in bed or on a chair. 2. Hold the incentive spirometer in an upright position. 3. Breathe out normally. 4. Place the mouthpiece in your mouth and seal your lips tightly around it. 5. Breathe in slowly and as deeply as possible, raising the piston or the ball toward the top of the column. 6. Hold your breath for 3-5 seconds or for as long as possible. Allow the piston or ball to fall to the bottom of the column. 7. Remove the mouthpiece from your mouth and breathe out normally. 8. Rest for a few seconds and repeat Steps 1 through 7 at least 10 times every 1-2 hours when you are awake. Take your time and take a few normal breaths between deep breaths. 9. The spirometer may include an indicator to  show your best effort. Use the indicator as a goal to work toward during each repetition. 10. After each set of 10 deep breaths, practice coughing to be sure your lungs are clear. If you have an incision (the cut made at the time of surgery), support your incision when coughing by placing a pillow or rolled up towels firmly against it. Once you are able to get out of bed, walk around indoors and cough well. You may stop using the incentive spirometer when instructed by your caregiver.  RISKS AND COMPLICATIONS  Take your time so you do not get dizzy or light-headed.  If you are in pain, you may need to take or ask for pain medication before doing incentive spirometry. It is harder to take a deep breath if you are having pain. AFTER USE  Rest and breathe slowly and easily.  It can be helpful to keep track of a log of your progress. Your caregiver can provide you with a simple table to help with this. If you are using the spirometer at home, follow these instructions: [place] IF:   You are having difficultly using the spirometer.  You have trouble using the spirometer as often as instructed.  Your pain medication is not giving enough relief while  using the spirometer.  You develop fever of 100.5 F (38.1 C) or higher. SEEK IMMEDIATE MEDICAL CARE IF:   You cough up bloody sputum that had not been present before.  You develop fever of 102 F (38.9 C) or greater.  You develop worsening pain at or near the incision site. MAKE SURE YOU:   Understand these instructions.  Will watch your condition.  Will get help right away if you are not doing well or get worse. Document Released: 10/22/2006 Document Revised: 09/03/2011 Document Reviewed: 12/23/2006 [facility] Patient Information 2014 [place], [place].   ________________________________________________________________________

## 2018-04-11 NOTE — Progress Notes (Signed)
LOV/CARDIAC CLEARANCE DR.[facility] 03-19-18 Epic   EKG 03-19-18 Epic

## 2018-04-15 ENCOUNTER — Encounter (HOSPITAL_COMMUNITY)
Admission: RE | Admit: 2018-04-15 | Discharge: 2018-04-15 | Disposition: A | Discharge status: Home / Self Care | Payer: Medicare Other | Source: Ambulatory Visit | Attending: Orthopedic Surgery | Admitting: Orthopedic Surgery

## 2018-04-15 ENCOUNTER — Other Ambulatory Visit: Payer: Self-pay

## 2018-04-15 ENCOUNTER — Encounter (HOSPITAL_COMMUNITY): Payer: Self-pay

## 2018-04-15 DIAGNOSIS — D649 Anemia, unspecified: Secondary | ICD-10-CM | POA: Diagnosis not present

## 2018-04-15 DIAGNOSIS — E785 Hyperlipidemia, unspecified: Secondary | ICD-10-CM | POA: Diagnosis not present

## 2018-04-15 DIAGNOSIS — Z01812 Encounter for preprocedural laboratory examination: Secondary | ICD-10-CM | POA: Insufficient documentation

## 2018-04-15 DIAGNOSIS — I2510 Atherosclerotic heart disease of native coronary artery without angina pectoris: Secondary | ICD-10-CM | POA: Insufficient documentation

## 2018-04-15 DIAGNOSIS — K219 Gastro-esophageal reflux disease without esophagitis: Secondary | ICD-10-CM | POA: Insufficient documentation

## 2018-04-15 DIAGNOSIS — E1122 Type 2 diabetes mellitus with diabetic chronic kidney disease: Secondary | ICD-10-CM | POA: Insufficient documentation

## 2018-04-15 DIAGNOSIS — Z8673 Personal history of transient ischemic attack (TIA), and cerebral infarction without residual deficits: Secondary | ICD-10-CM | POA: Diagnosis not present

## 2018-04-15 DIAGNOSIS — I129 Hypertensive chronic kidney disease with stage 1 through stage 4 chronic kidney disease, or unspecified chronic kidney disease: Secondary | ICD-10-CM | POA: Insufficient documentation

## 2018-04-15 DIAGNOSIS — M1711 Unilateral primary osteoarthritis, right knee: Secondary | ICD-10-CM | POA: Insufficient documentation

## 2018-04-15 DIAGNOSIS — E039 Hypothyroidism, unspecified: Secondary | ICD-10-CM | POA: Insufficient documentation

## 2018-04-15 DIAGNOSIS — N183 Chronic kidney disease, stage 3 (moderate): Secondary | ICD-10-CM | POA: Diagnosis not present

## 2018-04-15 DIAGNOSIS — I447 Left bundle-branch block, unspecified: Secondary | ICD-10-CM | POA: Insufficient documentation

## 2018-04-15 HISTORY — DX: Unspecified hearing loss, unspecified ear: H91.90

## 2018-04-15 LAB — COMPREHENSIVE METABOLIC PANEL
ALBUMIN: 4 g/dL (ref 3.5–5.0)
ALT: 13 U/L (ref 0–44)
AST: 21 U/L (ref 15–41)
Alkaline Phosphatase: 114 U/L (ref 38–126)
Anion gap: 8 (ref 5–15)
BUN: 33 mg/dL — AB (ref 8–23)
CHLORIDE: 102 mmol/L (ref 98–111)
CO2: 29 mmol/L (ref 22–32)
CREATININE: 1.3 mg/dL — AB (ref 0.44–1.00)
Calcium: 9.3 mg/dL (ref 8.9–10.3)
GFR calc Af Amer: 44 mL/min — ABNORMAL LOW (ref >60)
GFR calc non Af Amer: 38 mL/min — ABNORMAL LOW (ref >60)
GLUCOSE: 267 mg/dL — AB (ref 70–99)
Potassium: 4.6 mmol/L (ref 3.5–5.1)
Sodium: 139 mmol/L (ref 135–145)
Total Bilirubin: 0.6 mg/dL (ref 0.3–1.2)
Total Protein: 7.2 g/dL (ref 6.5–8.1)

## 2018-04-15 LAB — CBC WITH DIFFERENTIAL/PLATELET
ABS IMMATURE GRANULOCYTES: 0.02 10*3/uL (ref 0.00–0.07)
BASOS PCT: 1 %
Basophils Absolute: 0 10*3/uL (ref 0.0–0.1)
Eosinophils Absolute: 0.1 10*3/uL (ref 0.0–0.5)
Eosinophils Relative: 1 %
HCT: 36.5 % (ref 36.0–46.0)
Hemoglobin: 11.3 g/dL — ABNORMAL LOW (ref 12.0–15.0)
IMMATURE GRANULOCYTES: 0 %
LYMPHS ABS: 1.6 10*3/uL (ref 0.7–4.0)
Lymphocytes Relative: 25 %
MCH: 30.3 pg (ref 26.0–34.0)
MCHC: 31 g/dL (ref 30.0–36.0)
MCV: 97.9 fL (ref 80.0–100.0)
MONOS PCT: 9 %
Monocytes Absolute: 0.6 10*3/uL (ref 0.1–1.0)
NEUTROS ABS: 4.2 10*3/uL (ref 1.7–7.7)
Neutrophils Relative %: 64 %
PLATELETS: 218 10*3/uL (ref 150–400)
RBC: 3.73 MIL/uL — ABNORMAL LOW (ref 3.87–5.11)
RDW: 14.8 % (ref 11.5–15.5)
WBC: 6.6 10*3/uL (ref 4.0–10.5)
nRBC: 0 % (ref 0.0–0.2)

## 2018-04-15 LAB — GLUCOSE, CAPILLARY: Glucose-Capillary: 246 mg/dL — ABNORMAL HIGH (ref 70–99)

## 2018-04-15 LAB — SURGICAL PCR SCREEN
MRSA, PCR: NEGATIVE
Staphylococcus aureus: NEGATIVE

## 2018-04-16 ENCOUNTER — Other Ambulatory Visit (HOSPITAL_COMMUNITY): Payer: Self-pay | Admitting: Emergency Medicine

## 2018-04-16 ENCOUNTER — Other Ambulatory Visit: Payer: Self-pay | Admitting: Orthopedic Surgery

## 2018-04-16 LAB — HEMOGLOBIN A1C
HEMOGLOBIN A1C: 7.5 % — AB (ref 4.8–5.6)
Mean Plasma Glucose: 169 mg/dL

## 2018-04-16 NOTE — Progress Notes (Signed)
Patient confirmed at her pre-op appt yesterday that she is to have a LEFT TKA. Consent order in epic was for a right TKA. Patient reports she already had a right TKA done 10 years ago also with Dr Ronnie Derby. RN discontinued incorrect consent order and contacted Dr Ronnie Derby office today ; spoke with surgery scheduler to request new  consent order with correct limb. SS to speak to PA [facility] to put in the order. Patient will sign consent day of surgery

## 2018-04-20 MED ORDER — BUPIVACAINE LIPOSOME 1.3 % IJ SUSP
20.0000 mL | Freq: Once | INTRAMUSCULAR | Status: DC
  Filled 2018-04-20: qty 20

## 2018-04-21 ENCOUNTER — Encounter (HOSPITAL_COMMUNITY): Payer: Self-pay | Admitting: Certified Registered Nurse Anesthetist

## 2018-04-21 ENCOUNTER — Ambulatory Visit (HOSPITAL_COMMUNITY): Payer: Medicare Other | Admitting: Anesthesiology

## 2018-04-21 ENCOUNTER — Telehealth (HOSPITAL_COMMUNITY): Payer: Self-pay | Admitting: *Deleted

## 2018-04-21 ENCOUNTER — Encounter (HOSPITAL_COMMUNITY)
Admission: RE | Disposition: A | Discharge status: Home / Self Care | Payer: Self-pay | Source: Ambulatory Visit | Attending: Orthopedic Surgery

## 2018-04-21 ENCOUNTER — Observation Stay (HOSPITAL_COMMUNITY)
Admission: RE | Admit: 2018-04-21 | Discharge: 2018-04-22 | Disposition: A | Discharge status: Home / Self Care | Payer: Medicare Other | Source: Ambulatory Visit | Attending: Orthopedic Surgery | Admitting: Orthopedic Surgery

## 2018-04-21 ENCOUNTER — Other Ambulatory Visit: Payer: Self-pay

## 2018-04-21 DIAGNOSIS — E039 Hypothyroidism, unspecified: Secondary | ICD-10-CM | POA: Diagnosis not present

## 2018-04-21 DIAGNOSIS — E1122 Type 2 diabetes mellitus with diabetic chronic kidney disease: Secondary | ICD-10-CM | POA: Insufficient documentation

## 2018-04-21 DIAGNOSIS — Z79899 Other long term (current) drug therapy: Secondary | ICD-10-CM | POA: Insufficient documentation

## 2018-04-21 DIAGNOSIS — Z7902 Long term (current) use of antithrombotics/antiplatelets: Secondary | ICD-10-CM | POA: Insufficient documentation

## 2018-04-21 DIAGNOSIS — M25562 Pain in left knee: Secondary | ICD-10-CM | POA: Diagnosis not present

## 2018-04-21 DIAGNOSIS — Z96651 Presence of right artificial knee joint: Secondary | ICD-10-CM | POA: Diagnosis not present

## 2018-04-21 DIAGNOSIS — E669 Obesity, unspecified: Secondary | ICD-10-CM | POA: Insufficient documentation

## 2018-04-21 DIAGNOSIS — Z7982 Long term (current) use of aspirin: Secondary | ICD-10-CM | POA: Insufficient documentation

## 2018-04-21 DIAGNOSIS — I130 Hypertensive heart and chronic kidney disease with heart failure and stage 1 through stage 4 chronic kidney disease, or unspecified chronic kidney disease: Secondary | ICD-10-CM | POA: Diagnosis not present

## 2018-04-21 DIAGNOSIS — Z951 Presence of aortocoronary bypass graft: Secondary | ICD-10-CM | POA: Insufficient documentation

## 2018-04-21 DIAGNOSIS — N183 Chronic kidney disease, stage 3 (moderate): Secondary | ICD-10-CM | POA: Insufficient documentation

## 2018-04-21 DIAGNOSIS — Z955 Presence of coronary angioplasty implant and graft: Secondary | ICD-10-CM | POA: Diagnosis not present

## 2018-04-21 DIAGNOSIS — Z79890 Hormone replacement therapy (postmenopausal): Secondary | ICD-10-CM | POA: Insufficient documentation

## 2018-04-21 DIAGNOSIS — Z96652 Presence of left artificial knee joint: Secondary | ICD-10-CM

## 2018-04-21 DIAGNOSIS — E1165 Type 2 diabetes mellitus with hyperglycemia: Secondary | ICD-10-CM | POA: Diagnosis not present

## 2018-04-21 DIAGNOSIS — Z794 Long term (current) use of insulin: Secondary | ICD-10-CM | POA: Insufficient documentation

## 2018-04-21 DIAGNOSIS — Z96659 Presence of unspecified artificial knee joint: Secondary | ICD-10-CM

## 2018-04-21 DIAGNOSIS — M1712 Unilateral primary osteoarthritis, left knee: Principal | ICD-10-CM | POA: Insufficient documentation

## 2018-04-21 DIAGNOSIS — G8918 Other acute postprocedural pain: Secondary | ICD-10-CM | POA: Diagnosis not present

## 2018-04-21 DIAGNOSIS — I5022 Chronic systolic (congestive) heart failure: Secondary | ICD-10-CM | POA: Diagnosis not present

## 2018-04-21 DIAGNOSIS — Z6834 Body mass index (BMI) 34.0-34.9, adult: Secondary | ICD-10-CM | POA: Insufficient documentation

## 2018-04-21 HISTORY — PX: TOTAL KNEE ARTHROPLASTY: SHX125

## 2018-04-21 LAB — PROTIME-INR
INR: 0.94
Prothrombin Time: 12.5 seconds (ref 11.4–15.2)

## 2018-04-21 LAB — GLUCOSE, CAPILLARY
GLUCOSE-CAPILLARY: 91 mg/dL (ref 70–99)
Glucose-Capillary: 102 mg/dL — ABNORMAL HIGH (ref 70–99)

## 2018-04-21 SURGERY — ARTHROPLASTY, KNEE, TOTAL
Anesthesia: Spinal | Site: Knee | Laterality: Left

## 2018-04-21 MED ORDER — ONDANSETRON HCL 4 MG/2ML IJ SOLN
INTRAMUSCULAR | Status: DC | PRN
  Administered 2018-04-21: 4 mg via INTRAVENOUS

## 2018-04-21 MED ORDER — FENTANYL CITRATE (PF) 100 MCG/2ML IJ SOLN
INTRAMUSCULAR | Status: AC
  Filled 2018-04-21: qty 2

## 2018-04-21 MED ORDER — DEXAMETHASONE SODIUM PHOSPHATE 10 MG/ML IJ SOLN
10.0000 mg | Freq: Once | INTRAMUSCULAR | Status: AC
  Administered 2018-04-22: 10 mg via INTRAVENOUS
  Filled 2018-04-21: qty 1

## 2018-04-21 MED ORDER — FLEET ENEMA 7-19 GM/118ML RE ENEM: 1.0000 | ENEMA | Freq: Once | RECTAL | Status: DC | PRN

## 2018-04-21 MED ORDER — ALUM & MAG HYDROXIDE-SIMETH 200-200-20 MG/5ML PO SUSP: 30.0000 mL | ORAL | Status: DC | PRN

## 2018-04-21 MED ORDER — SODIUM CHLORIDE 0.9 % IR SOLN
Status: DC | PRN
  Administered 2018-04-21: 1000 mL

## 2018-04-21 MED ORDER — FAMOTIDINE 20 MG PO TABS: 10.0000 mg | ORAL_TABLET | Freq: Two times a day (BID) | ORAL | Status: DC | PRN

## 2018-04-21 MED ORDER — METOCLOPRAMIDE HCL 5 MG PO TABS: 5.0000 mg | ORAL_TABLET | Freq: Three times a day (TID) | ORAL | Status: DC | PRN

## 2018-04-21 MED ORDER — OXYCODONE HCL 5 MG PO TABS: 5.0000 mg | ORAL_TABLET | ORAL | Status: DC | PRN

## 2018-04-21 MED ORDER — CARVEDILOL 6.25 MG PO TABS
6.2500 mg | ORAL_TABLET | Freq: Two times a day (BID) | ORAL | Status: DC
  Administered 2018-04-21 – 2018-04-22 (×2): 6.25 mg via ORAL
  Filled 2018-04-21 (×2): qty 1

## 2018-04-21 MED ORDER — BUPIVACAINE-EPINEPHRINE 0.5% -1:200000 IJ SOLN
INTRAMUSCULAR | Status: DC | PRN
  Administered 2018-04-21: 20 mL

## 2018-04-21 MED ORDER — OXYCODONE HCL 5 MG PO TABS: 5.0000 mg | ORAL_TABLET | Freq: Once | ORAL | Status: DC | PRN

## 2018-04-21 MED ORDER — LACTATED RINGERS IV SOLN
INTRAVENOUS | Status: DC
  Administered 2018-04-21 (×2): via INTRAVENOUS

## 2018-04-21 MED ORDER — GABAPENTIN 300 MG PO CAPS: 300.0000 mg | ORAL_CAPSULE | Freq: Once | ORAL | Status: DC

## 2018-04-21 MED ORDER — TRANEXAMIC ACID-NACL 1000-0.7 MG/100ML-% IV SOLN
1000.0000 mg | Freq: Once | INTRAVENOUS | Status: AC
  Administered 2018-04-21: 1000 mg via INTRAVENOUS
  Filled 2018-04-21: qty 100

## 2018-04-21 MED ORDER — METHOCARBAMOL 500 MG PO TABS: 500.0000 mg | ORAL_TABLET | Freq: Four times a day (QID) | ORAL | Status: DC | PRN

## 2018-04-21 MED ORDER — BUPIVACAINE IN DEXTROSE 0.75-8.25 % IT SOLN
INTRATHECAL | Status: DC | PRN
  Administered 2018-04-21: 1.6 mL via INTRATHECAL

## 2018-04-21 MED ORDER — SODIUM CHLORIDE 0.9 % IV SOLN
INTRAVENOUS | Status: DC | PRN
  Administered 2018-04-21: 50 ug/min via INTRAVENOUS

## 2018-04-21 MED ORDER — ROPIVACAINE HCL 7.5 MG/ML IJ SOLN
INTRAMUSCULAR | Status: DC | PRN
  Administered 2018-04-21: 20 mL via PERINEURAL

## 2018-04-21 MED ORDER — CHLORHEXIDINE GLUCONATE 4 % EX LIQD: 60.0000 mL | Freq: Once | CUTANEOUS | Status: DC

## 2018-04-21 MED ORDER — DEXTROSE 5 % IV SOLN: 3.0000 g | INTRAVENOUS | Status: DC

## 2018-04-21 MED ORDER — ALLOPURINOL 300 MG PO TABS
300.0000 mg | ORAL_TABLET | Freq: Every morning | ORAL | Status: DC
  Administered 2018-04-22: 300 mg via ORAL
  Filled 2018-04-21: qty 1

## 2018-04-21 MED ORDER — CEFAZOLIN SODIUM-DEXTROSE 2-4 GM/100ML-% IV SOLN
2.0000 g | Freq: Four times a day (QID) | INTRAVENOUS | Status: AC
  Administered 2018-04-21 – 2018-04-22 (×2): 2 g via INTRAVENOUS
  Filled 2018-04-21 (×2): qty 100

## 2018-04-21 MED ORDER — SODIUM CHLORIDE 0.9 % IJ SOLN
INTRAMUSCULAR | Status: AC
  Filled 2018-04-21: qty 20

## 2018-04-21 MED ORDER — BUPIVACAINE LIPOSOME 1.3 % IJ SUSP
INTRAMUSCULAR | Status: DC | PRN
  Administered 2018-04-21: 20 mL

## 2018-04-21 MED ORDER — BISACODYL 5 MG PO TBEC: 5.0000 mg | DELAYED_RELEASE_TABLET | Freq: Every day | ORAL | Status: DC | PRN

## 2018-04-21 MED ORDER — PHENYLEPHRINE 40 MCG/ML (10ML) SYRINGE FOR IV PUSH (FOR BLOOD PRESSURE SUPPORT)
PREFILLED_SYRINGE | INTRAVENOUS | Status: DC | PRN
  Administered 2018-04-21: 80 ug via INTRAVENOUS

## 2018-04-21 MED ORDER — GABAPENTIN 300 MG PO CAPS
300.0000 mg | ORAL_CAPSULE | Freq: Three times a day (TID) | ORAL | Status: DC
  Administered 2018-04-21 – 2018-04-22 (×3): 300 mg via ORAL
  Filled 2018-04-21 (×3): qty 1

## 2018-04-21 MED ORDER — ASPIRIN EC 325 MG PO TBEC
325.0000 mg | DELAYED_RELEASE_TABLET | Freq: Every day | ORAL | Status: DC
  Administered 2018-04-21 – 2018-04-22 (×2): 325 mg via ORAL
  Filled 2018-04-21 (×2): qty 1

## 2018-04-21 MED ORDER — ZOLPIDEM TARTRATE 5 MG PO TABS
5.0000 mg | ORAL_TABLET | Freq: Every evening | ORAL | Status: DC | PRN
  Administered 2018-04-21: 5 mg via ORAL
  Filled 2018-04-21: qty 1

## 2018-04-21 MED ORDER — SODIUM CHLORIDE 0.9% FLUSH
INTRAVENOUS | Status: DC | PRN
  Administered 2018-04-21: 20 mL

## 2018-04-21 MED ORDER — CEFAZOLIN SODIUM-DEXTROSE 2-4 GM/100ML-% IV SOLN
2.0000 g | INTRAVENOUS | Status: AC
  Administered 2018-04-21: 2 g via INTRAVENOUS
  Filled 2018-04-21: qty 100

## 2018-04-21 MED ORDER — GABAPENTIN 300 MG PO CAPS
300.0000 mg | ORAL_CAPSULE | Freq: Once | ORAL | Status: AC
  Administered 2018-04-21: 300 mg via ORAL
  Filled 2018-04-21: qty 1

## 2018-04-21 MED ORDER — INSULIN ASPART PROT & ASPART (70-30 MIX) 100 UNIT/ML ~~LOC~~ SUSP
25.0000 [IU] | Freq: Every day | SUBCUTANEOUS | Status: DC
  Administered 2018-04-22: 25 [IU] via SUBCUTANEOUS
  Filled 2018-04-21: qty 10

## 2018-04-21 MED ORDER — ISOSORBIDE MONONITRATE ER 60 MG PO TB24
60.0000 mg | ORAL_TABLET | Freq: Every day | ORAL | Status: DC
  Administered 2018-04-22: 60 mg via ORAL
  Filled 2018-04-21: qty 1

## 2018-04-21 MED ORDER — SODIUM CHLORIDE 0.9 % IV SOLN
INTRAVENOUS | Status: DC
  Administered 2018-04-21: 19:00:00 via INTRAVENOUS

## 2018-04-21 MED ORDER — STERILE WATER FOR INJECTION IJ SOLN
INTRAMUSCULAR | Status: DC | PRN
  Administered 2018-04-21: 2000 mL

## 2018-04-21 MED ORDER — PROPOFOL 500 MG/50ML IV EMUL
INTRAVENOUS | Status: DC | PRN
  Administered 2018-04-21: 75 ug/kg/min via INTRAVENOUS

## 2018-04-21 MED ORDER — FENTANYL CITRATE (PF) 100 MCG/2ML IJ SOLN
25.0000 ug | INTRAMUSCULAR | Status: DC | PRN
  Administered 2018-04-21 (×2): 50 ug via INTRAVENOUS

## 2018-04-21 MED ORDER — ACETAMINOPHEN 500 MG PO TABS
1000.0000 mg | ORAL_TABLET | Freq: Four times a day (QID) | ORAL | Status: AC
  Administered 2018-04-21 – 2018-04-22 (×4): 1000 mg via ORAL
  Filled 2018-04-21 (×4): qty 2

## 2018-04-21 MED ORDER — ONDANSETRON HCL 4 MG/2ML IJ SOLN: 4.0000 mg | Freq: Four times a day (QID) | INTRAMUSCULAR | Status: DC | PRN

## 2018-04-21 MED ORDER — ONDANSETRON HCL 4 MG PO TABS: 4.0000 mg | ORAL_TABLET | Freq: Four times a day (QID) | ORAL | Status: DC | PRN

## 2018-04-21 MED ORDER — SENNOSIDES-DOCUSATE SODIUM 8.6-50 MG PO TABS: 1.0000 | ORAL_TABLET | Freq: Every evening | ORAL | Status: DC | PRN

## 2018-04-21 MED ORDER — OXYCODONE HCL 5 MG/5ML PO SOLN
5.0000 mg | Freq: Once | ORAL | Status: DC | PRN
  Filled 2018-04-21: qty 5

## 2018-04-21 MED ORDER — BUPIVACAINE-EPINEPHRINE 0.5% -1:200000 IJ SOLN
INTRAMUSCULAR | Status: AC
  Filled 2018-04-21: qty 1

## 2018-04-21 MED ORDER — BUPIVACAINE LIPOSOME 1.3 % IJ SUSP: 20.0000 mL | Freq: Once | INTRAMUSCULAR | Status: DC

## 2018-04-21 MED ORDER — DOCUSATE SODIUM 100 MG PO CAPS
100.0000 mg | ORAL_CAPSULE | Freq: Two times a day (BID) | ORAL | Status: DC
  Administered 2018-04-21 – 2018-04-22 (×2): 100 mg via ORAL
  Filled 2018-04-21 (×2): qty 1

## 2018-04-21 MED ORDER — PANTOPRAZOLE SODIUM 40 MG PO TBEC
40.0000 mg | DELAYED_RELEASE_TABLET | Freq: Every day | ORAL | Status: DC
  Administered 2018-04-21 – 2018-04-22 (×2): 40 mg via ORAL
  Filled 2018-04-21 (×2): qty 1

## 2018-04-21 MED ORDER — FENTANYL CITRATE (PF) 100 MCG/2ML IJ SOLN
50.0000 ug | Freq: Once | INTRAMUSCULAR | Status: AC
  Administered 2018-04-21: 50 ug via INTRAVENOUS
  Filled 2018-04-21: qty 2

## 2018-04-21 MED ORDER — PROPOFOL 10 MG/ML IV BOLUS
INTRAVENOUS | Status: DC | PRN
  Administered 2018-04-21: 20 mg via INTRAVENOUS

## 2018-04-21 MED ORDER — CLOPIDOGREL BISULFATE 75 MG PO TABS
75.0000 mg | ORAL_TABLET | Freq: Every day | ORAL | Status: DC
  Administered 2018-04-22: 75 mg via ORAL
  Filled 2018-04-21: qty 1

## 2018-04-21 MED ORDER — LEVOTHYROXINE SODIUM 50 MCG PO TABS
50.0000 ug | ORAL_TABLET | Freq: Every day | ORAL | Status: DC
  Administered 2018-04-22: 50 ug via ORAL
  Filled 2018-04-21: qty 1

## 2018-04-21 MED ORDER — MIDAZOLAM HCL 2 MG/2ML IJ SOLN
1.0000 mg | Freq: Once | INTRAMUSCULAR | Status: DC
  Filled 2018-04-21: qty 2

## 2018-04-21 MED ORDER — FUROSEMIDE 40 MG PO TABS
80.0000 mg | ORAL_TABLET | Freq: Every day | ORAL | Status: DC
  Administered 2018-04-21 – 2018-04-22 (×2): 80 mg via ORAL
  Filled 2018-04-21 (×2): qty 2

## 2018-04-21 MED ORDER — MENTHOL 3 MG MT LOZG: 1.0000 | LOZENGE | OROMUCOSAL | Status: DC | PRN

## 2018-04-21 MED ORDER — DEXAMETHASONE SODIUM PHOSPHATE 10 MG/ML IJ SOLN: 8.0000 mg | Freq: Once | INTRAMUSCULAR | Status: DC

## 2018-04-21 MED ORDER — METOCLOPRAMIDE HCL 5 MG/ML IJ SOLN: 5.0000 mg | Freq: Three times a day (TID) | INTRAMUSCULAR | Status: DC | PRN

## 2018-04-21 MED ORDER — PROPOFOL 10 MG/ML IV BOLUS
INTRAVENOUS | Status: AC
  Filled 2018-04-21: qty 60

## 2018-04-21 MED ORDER — ACETAMINOPHEN 500 MG PO TABS
1000.0000 mg | ORAL_TABLET | Freq: Once | ORAL | Status: AC
  Administered 2018-04-21: 1000 mg via ORAL
  Filled 2018-04-21: qty 2

## 2018-04-21 MED ORDER — FERROUS SULFATE 325 (65 FE) MG PO TABS
325.0000 mg | ORAL_TABLET | Freq: Three times a day (TID) | ORAL | Status: DC
  Administered 2018-04-22 (×2): 325 mg via ORAL
  Filled 2018-04-21 (×2): qty 1

## 2018-04-21 MED ORDER — DIPHENHYDRAMINE HCL 12.5 MG/5ML PO ELIX: 12.5000 mg | ORAL_SOLUTION | ORAL | Status: DC | PRN

## 2018-04-21 MED ORDER — TRANEXAMIC ACID-NACL 1000-0.7 MG/100ML-% IV SOLN: 1000.0000 mg | INTRAVENOUS | Status: DC

## 2018-04-21 MED ORDER — DEXAMETHASONE SODIUM PHOSPHATE 10 MG/ML IJ SOLN
INTRAMUSCULAR | Status: DC | PRN
  Administered 2018-04-21: 10 mg via INTRAVENOUS

## 2018-04-21 MED ORDER — PHENOL 1.4 % MT LIQD
1.0000 | OROMUCOSAL | Status: DC | PRN
  Filled 2018-04-21: qty 177

## 2018-04-21 MED ORDER — ACETAMINOPHEN 500 MG PO TABS: 1000.0000 mg | ORAL_TABLET | Freq: Once | ORAL | Status: DC

## 2018-04-21 MED ORDER — PANTOPRAZOLE SODIUM 40 MG PO TBEC: 40.0000 mg | DELAYED_RELEASE_TABLET | Freq: Every day | ORAL | Status: DC

## 2018-04-21 MED ORDER — ONDANSETRON HCL 4 MG/2ML IJ SOLN: 4.0000 mg | Freq: Once | INTRAMUSCULAR | Status: DC | PRN

## 2018-04-21 MED ORDER — TRANEXAMIC ACID-NACL 1000-0.7 MG/100ML-% IV SOLN
1000.0000 mg | INTRAVENOUS | Status: DC
  Filled 2018-04-21: qty 100

## 2018-04-21 MED ORDER — TRAMADOL HCL 50 MG PO TABS
50.0000 mg | ORAL_TABLET | Freq: Four times a day (QID) | ORAL | Status: DC
  Administered 2018-04-21 – 2018-04-22 (×4): 50 mg via ORAL
  Filled 2018-04-21 (×4): qty 1

## 2018-04-21 MED ORDER — HYDROMORPHONE HCL 1 MG/ML IJ SOLN: 0.5000 mg | INTRAMUSCULAR | Status: DC | PRN

## 2018-04-21 MED ORDER — METHOCARBAMOL 500 MG IVPB - SIMPLE MED
500.0000 mg | Freq: Four times a day (QID) | INTRAVENOUS | Status: DC | PRN
  Filled 2018-04-21: qty 50

## 2018-04-21 SURGICAL SUPPLY — 59 items
ARTISURF 10M PLY L 6-9EF KNEE (Knees) ×3 IMPLANT
BAG ZIPLOCK 12X15 (MISCELLANEOUS) ×3 IMPLANT
BANDAGE ACE 6X5 VEL STRL LF (GAUZE/BANDAGES/DRESSINGS) ×3 IMPLANT
BLADE SAGITTAL 13X1.27X60 (BLADE) ×2 IMPLANT
BLADE SAGITTAL 13X1.27X60MM (BLADE) ×1
BLADE SAW SGTL 83.5X18.5 (BLADE) ×3 IMPLANT
BLADE SURG 15 STRL LF DISP TIS (BLADE) ×1 IMPLANT
BLADE SURG 15 STRL SS (BLADE) ×2
BOWL SMART MIX CTS (DISPOSABLE) ×3 IMPLANT
CEMENT BONE SIMPLEX SPEEDSET (Cement) ×6 IMPLANT
CLOSURE WOUND 1/2 X4 (GAUZE/BANDAGES/DRESSINGS) ×2
COVER SURGICAL LIGHT HANDLE (MISCELLANEOUS) ×3 IMPLANT
COVER WAND RF STERILE (DRAPES) IMPLANT
CUFF TOURN SGL QUICK 34 (TOURNIQUET CUFF) ×2
CUFF TRNQT CYL 34X4X40X1 (TOURNIQUET CUFF) ×1 IMPLANT
DECANTER SPIKE VIAL GLASS SM (MISCELLANEOUS) ×6 IMPLANT
DRAPE INCISE IOBAN 66X45 STRL (DRAPES) ×6 IMPLANT
DRAPE U-SHAPE 47X51 STRL (DRAPES) ×3 IMPLANT
DRESSING AQUACEL AG SP 3.5X10 (GAUZE/BANDAGES/DRESSINGS) ×1 IMPLANT
DRSG AQUACEL AG SP 3.5X10 (GAUZE/BANDAGES/DRESSINGS) ×3
DURAPREP 26ML APPLICATOR (WOUND CARE) ×6 IMPLANT
ELECT REM PT RETURN 15FT ADLT (MISCELLANEOUS) ×3 IMPLANT
FEMUR  CMT CCR STD SZ8 L KNEE (Knees) ×2 IMPLANT
FEMUR CMT CCR STD SZ8 L KNEE (Knees) ×1 IMPLANT
FEMUR CMTD CCR STD SZ8 L KNEE (Knees) ×1 IMPLANT
GLOVE BIOGEL M STRL SZ7.5 (GLOVE) ×3 IMPLANT
GLOVE BIOGEL PI IND STRL 7.5 (GLOVE) ×1 IMPLANT
GLOVE BIOGEL PI IND STRL 8.5 (GLOVE) ×2 IMPLANT
GLOVE BIOGEL PI IND STRL 9 (GLOVE) ×1 IMPLANT
GLOVE BIOGEL PI INDICATOR 7.5 (GLOVE) ×2
GLOVE BIOGEL PI INDICATOR 8.5 (GLOVE) ×4
GLOVE BIOGEL PI INDICATOR 9 (GLOVE) ×2
GLOVE ECLIPSE 8.5 STRL (GLOVE) ×3 IMPLANT
GLOVE SURG ORTHO 8.0 STRL STRW (GLOVE) ×9 IMPLANT
GOWN STRL REUS W/ TWL XL LVL3 (GOWN DISPOSABLE) ×1 IMPLANT
GOWN STRL REUS W/TWL 2XL LVL3 (GOWN DISPOSABLE) ×6 IMPLANT
GOWN STRL REUS W/TWL XL LVL3 (GOWN DISPOSABLE) ×2
HANDPIECE INTERPULSE COAX TIP (DISPOSABLE) ×2
HOOD PEEL AWAY FLYTE STAYCOOL (MISCELLANEOUS) ×9 IMPLANT
MANIFOLD NEPTUNE II (INSTRUMENTS) ×3 IMPLANT
NS IRRIG 1000ML POUR BTL (IV SOLUTION) ×3 IMPLANT
PACK TOTAL KNEE CUSTOM (KITS) ×3 IMPLANT
POSITIONER SURGICAL ARM (MISCELLANEOUS) ×3 IMPLANT
SET HNDPC FAN SPRY TIP SCT (DISPOSABLE) ×1 IMPLANT
STEM POLY PAT PLY 35M KNEE (Knees) ×3 IMPLANT
STEM TIBIA 5 DEG SZ E L KNEE (Knees) ×1 IMPLANT
STRIP CLOSURE SKIN 1/2X4 (GAUZE/BANDAGES/DRESSINGS) ×4 IMPLANT
SUT BONE WAX W31G (SUTURE) ×3 IMPLANT
SUT MNCRL AB 3-0 PS2 18 (SUTURE) ×3 IMPLANT
SUT STRATAFIX 0 PDS 27 VIOLET (SUTURE) ×3
SUT STRATAFIX PDS+ 0 24IN (SUTURE) ×3 IMPLANT
SUT VIC AB 1 CT1 36 (SUTURE) ×3 IMPLANT
SUTURE STRATFX 0 PDS 27 VIOLET (SUTURE) ×1 IMPLANT
SYR CONTROL 10ML LL (SYRINGE) ×6 IMPLANT
TIBIA STEM 5 DEG SZ E L KNEE (Knees) ×3 IMPLANT
TRAY FOLEY MTR SLVR 14FR STAT (SET/KITS/TRAYS/PACK) ×3 IMPLANT
WATER STERILE IRR 1000ML POUR (IV SOLUTION) ×6 IMPLANT
WRAP KNEE MAXI GEL POST OP (GAUZE/BANDAGES/DRESSINGS) ×3 IMPLANT
YANKAUER SUCT BULB TIP 10FT TU (MISCELLANEOUS) ×3 IMPLANT

## 2018-04-21 NOTE — H&P (Signed)
Carol [name] MRN:  [id number] DOB/SEX:  Jun 03, 1936/female  CHIEF COMPLAINT:  Painful left Knee  HISTORY: Patient is a 82 y.o. female presented with a history of pain in the left knee. Onset of symptoms was gradual starting a few years ago with gradually worsening course since that time. Patient has been treated conservatively with over-the-counter NSAIDs and activity modification. Patient currently rates pain in the knee at 10 out of 10 with activity. There is pain at night.  PAST MEDICAL HISTORY: Patient Active Problem List   Diagnosis Date Noted  . Chronic combined systolic and diastolic heart failure ([place]) 04/19/2015  . GERD (gastroesophageal reflux disease) 04/19/2015  . Spinal stenosis, lumbar region, with neurogenic claudication 12/22/2014  . Diabetes ([place]) 04/10/2014  . Asthma, moderate persistent 03/23/2014  . Post-infarction angina ([place]) 02/17/2014  . Abnormal nuclear stress test: Intermediate risk 02/17/2014  . Presence of drug coated stent in left circumflex coronary artery: OM2 - Xience Alpine DES 2.25 mm x 12, 2.25 mm x 8 mm overlap 02/17/2014    Class: Acute  . Ischemic cardiomyopathy - with near resolution of LVEF post MI 02/15/2014  . CKD stage 3 due to type 2 diabetes mellitus ([place]) 02/15/2014  . Burning chest pain 01/22/2014  . Hyperlipidemia with target LDL less than 70 01/22/2014  . History of non-ST elevation myocardial infarction (NSTEMI) 01/22/2014  . Coronary artery disease involving coronary bypass graft of native heart with angina pectoris ([place]) 01/22/2014  . Mixed conductive and sensorineural hearing loss 12/05/2012  . Essential hypertension 12/05/2012  . Unspecified hypothyroidism 12/05/2012  . Lower back pain 12/05/2012  . Gout, unspecified    Past Medical History:  Diagnosis Date  . Anemia    a. Noted on labs 02/2014 possibly procedurally related.  . Asthma   . CAD S/P percutaneous coronary angioplasty 01/22/2014   A. (12/2013): POBA - OM2 99% (very  tortuous segment) - reduced ~ 50% & TIMI 3 flow, 80-90% stenosis in mid PDA, Irregularities <50% in mRCA, pLAD and prox LCx; b. 02/17/14: Moderate sized fixed defect in Lateral wall --> cath with OM2 restenosis & otherwise stable --> Xience Alpine DES x 2 (2.25 mm x 12 & 8 mm). c. NSTEMI 02/2014 s/p PTCA/balloon angioplasty only to small caliber PDA.  . Carotid stenosis    Carotid US 11/17: L 1-39; FU prn  . Chronic systolic heart failure ([place])    a. ICM b. ECHO (01/2014): EF 25-30%, grade I DD, trivial MR. c. EF by Myoview 02/17/14 = ~53%. d. EF by cath 02/24/14 - improved to 50-55%.  . CKD (chronic kidney disease), stage III ([place])   . Contrast media allergy   . Environmental allergies   . GERD (gastroesophageal reflux disease)   . Gout, unspecified   . HOH (hard of hearing)   . Hyperlipidemia    a. H/o intolerance to Zocor, Lipitor. Had muscle aches on higher dose Crestor.  . Hypertension   . Hypothyroidism   . LBBB (left bundle branch block)    a. Transient during 02/2014 admission.  . NSTEMI (non-ST elevated myocardial infarction) ([place]) 2014; 12/2013, 01/2014  . QT prolongation    a. Noted on EKG 02/2014.  Marland Kitchen Sinus bradycardia    a. HR 40s-50s during 02/2014 admission, limiting BB dose.  Marland Kitchen TIA (transient ischemic attack) 1990's   a. TIA vs stroke 1990's "mild" - sounds like a TIA as she was told she had stroke symptoms but negative scans. Denies residual effects.  . Type II diabetes  mellitus [facility])    Past Surgical History:  Procedure Laterality Date  . ABDOMINAL HYSTERECTOMY     complete  . APPENDECTOMY  1959  . CARDIAC CATHETERIZATION  02/24/2014   Procedure: CORONARY BALLOON ANGIOPLASTY;  Surgeon: Burnell Blanks, MD;  Location: [facility] CATH LAB;  Service: Cardiovascular;;  . CARDIAC CATHETERIZATION N/A 04/20/2015   Procedure: Left Heart Cath and Coronary Angiography;  Surgeon: Belva Crome, MD;  Location: [place] CV LAB;  Service: Cardiovascular;  Laterality: N/A;  . CATARACT  EXTRACTION W/ INTRAOCULAR LENS  IMPLANT, BILATERAL Bilateral 2013  . [place]; 1960  . CHOLECYSTECTOMY  1989  . CORONARY ANGIOPLASTY  12/2013   POBA - OM2  . CORONARY ARTERY BYPASS GRAFT N/A 04/25/2015   Procedure: CORONARY ARTERY BYPASS GRAFTING (CABG) x four,  using left internal mammary artery and right leg greater saphenous vein harvested endoscopically;  Surgeon: Gaye Pollack, MD;  Location: [place] OR;  Service: Open Heart Surgery;  Laterality: N/A;  . JOINT REPLACEMENT  10 years ago    right knee  . LEFT HEART CATHETERIZATION WITH CORONARY ANGIOGRAM N/A 01/22/2014   Procedure: LEFT HEART CATHETERIZATION WITH CORONARY ANGIOGRAM;  Surgeon: Sinclair Grooms, MD;  Location: [facility] CATH LAB;  Service: Cardiovascular;  Laterality: N/A;  . LEFT HEART CATHETERIZATION WITH CORONARY ANGIOGRAM N/A 02/17/2014   Procedure: LEFT HEART CATHETERIZATION WITH CORONARY ANGIOGRAM;  Surgeon: Burnell Blanks, MD;  Location[facility] CATH LAB;  Service: Cardiovascular;  Laterality: N/A;  . LEFT HEART CATHETERIZATION WITH CORONARY ANGIOGRAM N/A 02/24/2014   Procedure: LEFT HEART CATHETERIZATION WITH CORONARY ANGIOGRAM;  Surgeon: Burnell Blanks, MD;  Location: [facility] CATH LAB;  Service: Cardiovascular;  Laterality: N/A;  . LUMBAR LAMINECTOMY/DECOMPRESSION MICRODISCECTOMY Left 12/22/2014   Procedure: CENTRAL DECOMPRESSIVE LUMBAR, AND LAMINECTOMY L5-S1,  S1-S2 FOR SPINAL STENOSIS FORAMINOTOMY L5, S1, S2 ROOTS ON LEFT FOR FORAMINAL STENOSIS, AND DECOMPRESSION L5-S1, S1-S2 ACCORDING TO LABELING OF [place] X-RAYS;  Surgeon: Latanya Maudlin, MD;  Location: [facility] ORS;  Service: Orthopedics;  Laterality: Left;  . PERCUTANEOUS CORONARY STENT INTERVENTION (PCI-S)  02/17/14   For restenosis of OM2 - PCI with Xience Apline DES 2.25 mm x 12 mm & 2.25 mm x 8 mm overlapping  . REDUCTION MAMMAPLASTY Bilateral 1990's  . SHOULDER OPEN ROTATOR CUFF REPAIR Bilateral 1980's - 1990's  . TEE WITHOUT CARDIOVERSION N/A  04/25/2015   Procedure: TRANSESOPHAGEAL ECHOCARDIOGRAM (TEE);  Surgeon: Gaye Pollack, MD;  Location: [place];  Service: Open Heart Surgery;  Laterality: N/A;  . TOTAL KNEE ARTHROPLASTY Right 2009     MEDICATIONS:   No medications prior to admission.    ALLERGIES:   Allergies  Allergen Reactions  . Contrast Media [Iodinated Diagnostic Agents] Other (See Comments)    unknown  . Cortizone-10 [Hydrocortisone] Other (See Comments)    unknown  . Lipitor [Atorvastatin]     Muscle aches- severe  . Zocor [Simvastatin]     Muscle aches- severe    REVIEW OF SYSTEMS:  A comprehensive review of systems was negative except for: Musculoskeletal: positive for arthralgias and bone pain   FAMILY HISTORY:   Family History  Problem Relation Age of Onset  . Hypertension Mother   . Diabetes Mother   . Heart attack Mother 15  . Heart disease Father   . Emphysema Father   . Diabetes Brother   . Heart disease Brother   . Cancer Sister   . Stroke Neg Hx     SOCIAL HISTORY:  Social History   Tobacco Use  . Smoking status: Never Smoker  . Smokeless tobacco: Never Used  Substance Use Topics  . Alcohol use: No     EXAMINATION:  Vital signs in last 24 hours:    There were no vitals taken for this visit.  General Appearance:    Alert, cooperative, no distress, appears stated age  Head:    Normocephalic, without obvious abnormality, atraumatic  Eyes:    PERRL, conjunctiva/corneas clear, EOM's intact, fundi    benign, both eyes  Ears:    Normal TM's and external ear canals, both ears  Nose:   Nares normal, septum midline, mucosa normal, no drainage    or sinus tenderness  Throat:   Lips, mucosa, and tongue normal; teeth and gums normal  Neck:   Supple, symmetrical, trachea midline, no adenopathy;    thyroid:  no enlargement/tenderness/nodules; no carotid   bruit or JVD  Back:     Symmetric, no curvature, ROM normal, no CVA tenderness  Lungs:     Clear to auscultation bilaterally,  respirations unlabored  Chest Wall:    No tenderness or deformity   Heart:    Regular rate and rhythm, S1 and S2 normal, no murmur, rub   or gallop  Breast Exam:    No tenderness, masses, or nipple abnormality  Abdomen:     Soft, non-tender, bowel sounds active all four quadrants,    no masses, no organomegaly  Genitalia:    Normal female without lesion, discharge or tenderness  Rectal:    Normal tone, no masses or tenderness;   guaiac negative stool  Extremities:   Extremities normal, atraumatic, no cyanosis or edema  Pulses:   2+ and symmetric all extremities  Skin:   Skin color, texture, turgor normal, no rashes or lesions  Lymph nodes:   Cervical, supraclavicular, and axillary nodes normal  Neurologic:   CNII-XII intact, normal strength, sensation and reflexes    throughout    Musculoskeletal:  ROM 0-120, Ligaments INTACT,  Imaging Review Plain radiographs demonstrate severe degenerative joint disease of the left knee. The overall alignment is neutral. The bone quality appears to be excellent for age and reported activity level.  Assessment/Plan: Primary osteoarthritis, left knee   The patient history, physical examination and imaging studies are consistent with advanced degenerative joint disease of the left knee. The patient has failed conservative treatment.  The clearance notes were reviewed.  After discussion with the patient it was felt that Total Knee Replacement was indicated. The procedure,  risks, and benefits of total knee arthroplasty were presented and reviewed. The risks including but not limited to aseptic loosening, infection, blood clots, vascular injury, stiffness, patella tracking problems complications among others were discussed. The patient acknowledged the explanation, agreed to proceed with the plan.  Preoperative templating of the joint replacement has been completed, documented, and submitted to the Operating Room personnel in order to optimize intra-operative  equipment management.    Patient's anticipated LOS is less than 2 midnights, meeting these requirements: - Lives within 1 hour of care - Has a competent adult at home to recover with post-op recover - NO history of  - Chronic pain requiring opiods  - Diabetes  - Coronary Artery Disease  - Heart failure  - Heart attack  - Stroke  - DVT/VTE  - Cardiac arrhythmia  - Respiratory Failure/COPD  - Renal failure  - Anemia  - Advanced Liver disease       Donia Ast 04/21/2018,  7:26 AM

## 2018-04-21 NOTE — Plan of Care (Signed)
Plan of care 

## 2018-04-21 NOTE — Progress Notes (Signed)
AssistedDr. Brock with left, ultrasound guided, adductor canal block. Side rails up, monitors on throughout procedure. See vital signs in flow sheet. Tolerated Procedure well.  

## 2018-04-21 NOTE — Anesthesia Preprocedure Evaluation (Addendum)
Anesthesia Evaluation  Patient identified by MRN, date of birth, ID band Patient awake    Reviewed: Allergy & Precautions, NPO status , Patient's Chart, lab work & pertinent test results  History of Anesthesia Complications Negative for: history of anesthetic complications  Airway Mallampati: III  TM Distance: >3 FB Neck ROM: Full    Dental  (+) Lower Dentures, Upper Dentures   Pulmonary asthma ,    breath sounds clear to auscultation       Cardiovascular hypertension, Pt. on medications and Pt. on home beta blockers (-) angina+ CAD, + Past MI, + Cardiac Stents and + CABG  + dysrhythmias  Rhythm:Regular Rate:Normal   '17 Carotid Duplex - 1-39% left ICAS, 0% right ICAS  '16 TEE - EF ~60%, no valvulopathy or dysfunction noted  '16 Cath - 1.Mid RCA to Dist RCA lesion, 70% stenosed. 2.1st RPLB lesion, 70% stenosed. 3.1st Mrg-1 lesion, 50% stenosed. 4.Dist Cx lesion, 40% stenosed. 5.Prox LAD to Dist LAD lesion, 25% stenosed. 6.LM lesion, 50% stenosed    Neuro/Psych  Hard of hearing Spinal stenosis with claudication  TIAnegative psych ROS   GI/Hepatic Neg liver ROS, GERD  Medicated,  Endo/Other  diabetes, Poorly Controlled, Type 2, Insulin DependentHypothyroidism  Obesity   Renal/GU CRFRenal disease     Musculoskeletal  (+) Arthritis ,  Gout    Abdominal   Peds  Hematology negative hematology ROS (+)   Anesthesia Other Findings   Reproductive/Obstetrics                            Anesthesia Physical Anesthesia Plan  ASA: III  Anesthesia Plan: Spinal   Post-op Pain Management:  Regional for Post-op pain   Induction:   PONV Risk Score and Plan: 2 and Treatment may vary due to age or medical condition and Propofol infusion  Airway Management Planned: Natural Airway and Simple Face Mask  Additional Equipment: None  Intra-op Plan:   Post-operative Plan:   Informed  Consent: I have reviewed the patients History and Physical, chart, labs and discussed the procedure including the risks, benefits and alternatives for the proposed anesthesia with the patient or authorized representative who has indicated his/her understanding and acceptance.     Plan Discussed with: CRNA and Anesthesiologist  Anesthesia Plan Comments: (Labs reviewed, platelets acceptable. Discussed risks and benefits of spinal, including spinal/epidural hematoma, infection, failed block, and PDPH. Patient expressed understanding and wished to proceed. )       Anesthesia Quick Evaluation

## 2018-04-21 NOTE — Anesthesia Procedure Notes (Signed)
Anesthesia Regional Block: Adductor canal block   Pre-Anesthetic Checklist: ,, timeout performed, Correct Patient, Correct Site, Correct Laterality, Correct Procedure, Correct Position, site marked, Risks and benefits discussed,  Surgical consent,  Pre-op evaluation,  At surgeon's request and post-op pain management  Laterality: Left  Prep: chloraprep       Needles:  Injection technique: Single-shot  Needle Type: Echogenic Needle     Needle Length: 10cm  Needle Gauge: 21     Additional Needles:   Narrative:  Start time: 04/21/2018 11:38 AM End time: 04/21/2018 11:42 AM Injection made incrementally with aspirations every 5 mL.  Performed by: Personally  Anesthesiologist: Audry Pili, MD  Additional Notes: No pain on injection. No increased resistance to injection. Injection made in 5cc increments. Good needle visualization. Patient tolerated the procedure well.

## 2018-04-21 NOTE — Anesthesia Procedure Notes (Signed)
Spinal  Patient location during procedure: OR Start time: 04/21/2018 1:14 PM Staffing Resident/CRNA: [place] Shasha Buchbinder C, CRNA Performed: resident/CRNA  Preanesthetic Checklist Completed: patient identified, site marked, surgical consent, pre-op evaluation, timeout performed, IV checked, risks and benefits discussed and monitors and equipment checked Spinal Block Patient position: sitting Prep: site prepped and draped and DuraPrep Patient monitoring: heart rate, continuous pulse ox and blood pressure Approach: midline Location: L2-3 Injection technique: single-shot Needle Needle type: Sprotte  Needle gauge: 24 G Needle length: 9 cm Assessment Sensory level: T6 Additional Notes Expiration date of kit checked and confirmed. Patient tolerated procedure well, without complications.

## 2018-04-21 NOTE — Evaluation (Signed)
Physical Therapy Evaluation Patient Details Name: Carol [name] MRN: [id number] DOB: 1936/03/29 Today's Date: 04/21/2018   History of Present Illness  82 YO female s/p L TKR on 04/21/18. 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     Clinical Impression  Pt presents with limited L knee ROM, L knee pain, difficulty performing mobility tasks, L knee buckling with ambulation, and decreased tolerance for ambulation. Pt to benefit from acute PT to address deficits. Pt ambulated 4 ft in room with RW with min assist for L knee guarding. Pt limited by L knee buckling this session, but pt able to assist in correcting buckling by performing L knee extension. PT to progress mobility as tolerated, and will continue to follow acutely.      Follow Up Recommendations Follow surgeon's recommendation for DC plan and follow-up therapies;Supervision for mobility/OOB(HHPT)    Equipment Recommendations  None recommended by PT    Recommendations for Other Services       Precautions / Restrictions        Mobility  Bed Mobility Overal bed mobility: Needs Assistance Bed Mobility: Supine to Sit     Supine to sit: Min assist;HOB elevated     General bed mobility comments: Min assist for LLE management, increased time to scoot to EOB. Pt with cramping sensation along hip adductors upon sitting EOB.   Transfers Overall transfer level: Needs assistance Equipment used: Rolling walker (2 wheeled) Transfers: Sit to/from Stand Sit to Stand: Min assist;From elevated surface         General transfer comment: Min assist for power up, steadying. Pt with increased time to come to upright standing. Verbal cuing for hand placement.   Ambulation/Gait Ambulation/Gait assistance: Min assist Gait Distance (Feet): 4 Feet Assistive device: Rolling walker (2 wheeled) Gait Pattern/deviations: Step-to pattern;Decreased  stride length;Antalgic;Decreased stance time - left;Decreased weight shift to left Gait velocity: decr    General Gait Details: Pt with decreased weight shift to L. With forward stepping, pt with self- and PT-corrected L knee buckling. PT had pt take a few steps forward, with close guarding, to turn and sit in recliner. Pt unsafe for further ambulation at this time. Assist for L knee guarding, steadying, sequencing.  Stairs            Wheelchair Mobility    Modified Rankin (Stroke Patients Only)       Balance Overall balance assessment: Mild deficits observed, not formally tested                                           Pertinent Vitals/Pain Pain Assessment: 0-10 Pain Score: 6  Pain Location: L knee  Pain Descriptors / Indicators: Aching;Sore Pain Intervention(s): Limited activity within patient's tolerance;Repositioned;Monitored during session;Premedicated before session;Ice applied    Home Living Family/patient expects to be discharged to:: Private residence Living Arrangements: Alone Available Help at Discharge: Family;Available 24 hours/day;Friend(s)(daughter will be staying with pt for the rest of the week, son lives next door, has a "precious neighbor across the street") Type of Home: House Home Access: Stairs to enter Entrance Stairs-Rails: None Entrance Stairs-Number of Steps: 2 Home Layout: One level Home Equipment: Bedside commode;Walker - 2 wheels;Crutches;Cane - single point;Shower seat      Prior Function Level of Independence: Independent  Hand Dominance   Dominant Hand: Right    Extremity/Trunk Assessment   Upper Extremity Assessment Upper Extremity Assessment: Overall WFL for tasks assessed    Lower Extremity Assessment Lower Extremity Assessment: Generalized weakness;LLE deficits/detail LLE Deficits / Details: suspected post-surgical weakness; able to perform SLR with mod assist, ankle pumps, quad set      Cervical / Trunk Assessment Cervical / Trunk Assessment: Normal  Communication   Communication: HOH  Cognition Arousal/Alertness: Awake/alert Behavior During Therapy: WFL for tasks assessed/performed Overall Cognitive Status: Within Functional Limits for tasks assessed                                        General Comments      Exercises Total Joint Exercises Ankle Circles/Pumps: AROM;Both;10 reps;Seated Goniometric ROM: Knee AAROM 5-50*, limited by pain    Assessment/Plan    PT Assessment Patient needs continued PT services  PT Problem List Decreased strength;Pain;Decreased range of motion;Decreased activity tolerance;Decreased knowledge of use of DME;Decreased balance;Decreased safety awareness;Decreased mobility       PT Treatment Interventions DME instruction;Therapeutic activities;Gait training;Patient/family education;Therapeutic exercise;Stair training;Balance training;Functional mobility training    PT Goals (Current goals can be found in the Care Plan section)  Acute Rehab PT Goals Patient Stated Goal: none stated PT Goal Formulation: With patient Time For Goal Achievement: 04/28/18 Potential to Achieve Goals: Good    Frequency 7X/week   Barriers to discharge        Co-evaluation               AM-PAC PT "6 Clicks" Daily Activity  Outcome Measure Difficulty turning over in bed (including adjusting bedclothes, sheets and blankets)?: Unable Difficulty moving from lying on back to sitting on the side of the bed? : Unable Difficulty sitting down on and standing up from a chair with arms (e.g., wheelchair, bedside commode, etc,.)?: Unable Help needed moving to and from a bed to chair (including a wheelchair)?: A Little Help needed walking in hospital room?: A Little Help needed climbing 3-5 steps with a railing? : A Lot 6 Click Score: 11    End of Session Equipment Utilized During Treatment: Gait belt Activity Tolerance: Other  (comment);Patient limited by pain(L knee buckling ) Patient left: in chair;with chair alarm set;with call bell/phone within reach;with SCD's reapplied Nurse Communication: Mobility status;Other (comment)(L knee buckling ) PT Visit Diagnosis: Difficulty in walking, not elsewhere classified (R26.2);Other abnormalities of gait and mobility (R26.89)    Time: 5974-1638 PT Time Calculation (min) (ACUTE ONLY): 24 min   Charges:   PT Evaluation $PT Eval Low Complexity: 1 Low PT Treatments $Therapeutic Activity: 8-22 mins       Cordella Nyquist Conception Chancy, PT Acute Rehabilitation Services Pager [phone]  Office [phone]  Axton Cihlar D Brexley Cutshaw 04/21/2018, 8:09 PM

## 2018-04-21 NOTE — Transfer of Care (Signed)
Immediate Anesthesia Transfer of Care Note  Patient: Carol [name]  Procedure(s) Performed: LEFT TOTAL KNEE ARTHROPLASTY (Left Knee)  Patient Location: PACU  Anesthesia Type:Spinal  Level of Consciousness: awake, alert  and oriented  Airway & Oxygen Therapy: Patient Spontanous Breathing and Patient connected to face mask oxygen  Post-op Assessment: Report given to RN and Post -op Vital signs reviewed and stable  Post vital signs: Reviewed and stable  Last Vitals:  Vitals Value Taken Time  BP 110/51 04/21/2018  2:42 PM  Temp    Pulse 52 04/21/2018  2:44 PM  Resp 15 04/21/2018  2:44 PM  SpO2 100 % 04/21/2018  2:44 PM  Vitals shown include unvalidated device data.  Last Pain:  Vitals:   04/21/18 1019  TempSrc: Oral         Complications: No apparent anesthesia complications

## 2018-04-21 NOTE — Anesthesia Postprocedure Evaluation (Signed)
Anesthesia Post Note  Patient: [name]  Procedure(s) Performed: LEFT TOTAL KNEE ARTHROPLASTY (Left Knee)     Patient location during evaluation: PACU Anesthesia Type: Spinal Level of consciousness: oriented and awake and alert Pain management: pain level controlled Vital Signs Assessment: post-procedure vital signs reviewed and stable Respiratory status: spontaneous breathing, respiratory function stable and patient connected to nasal cannula oxygen Cardiovascular status: blood pressure returned to baseline and stable Postop Assessment: no headache, no backache and no apparent nausea or vomiting Anesthetic complications: no    Last Vitals:  Vitals:   04/21/18 1500 04/21/18 1515  BP: 133/62 (!) 148/64  Pulse: (!) 47 (!) 52  Resp: (!) 24 12  Temp: (!) 35 C (!) 36.3 C  SpO2: 100% 100%    Last Pain:  Vitals:   04/21/18 1515  TempSrc:   PainSc: 2                  Deondrea Markos S

## 2018-04-22 ENCOUNTER — Encounter (HOSPITAL_COMMUNITY): Payer: Self-pay | Admitting: Orthopedic Surgery

## 2018-04-22 DIAGNOSIS — Z951 Presence of aortocoronary bypass graft: Secondary | ICD-10-CM | POA: Diagnosis not present

## 2018-04-22 DIAGNOSIS — E039 Hypothyroidism, unspecified: Secondary | ICD-10-CM | POA: Diagnosis not present

## 2018-04-22 DIAGNOSIS — M25562 Pain in left knee: Secondary | ICD-10-CM | POA: Diagnosis not present

## 2018-04-22 DIAGNOSIS — I5022 Chronic systolic (congestive) heart failure: Secondary | ICD-10-CM | POA: Diagnosis not present

## 2018-04-22 DIAGNOSIS — I130 Hypertensive heart and chronic kidney disease with heart failure and stage 1 through stage 4 chronic kidney disease, or unspecified chronic kidney disease: Secondary | ICD-10-CM | POA: Diagnosis not present

## 2018-04-22 DIAGNOSIS — Z955 Presence of coronary angioplasty implant and graft: Secondary | ICD-10-CM | POA: Diagnosis not present

## 2018-04-22 DIAGNOSIS — Z79899 Other long term (current) drug therapy: Secondary | ICD-10-CM | POA: Diagnosis not present

## 2018-04-22 DIAGNOSIS — Z96652 Presence of left artificial knee joint: Secondary | ICD-10-CM | POA: Diagnosis not present

## 2018-04-22 DIAGNOSIS — Z794 Long term (current) use of insulin: Secondary | ICD-10-CM | POA: Diagnosis not present

## 2018-04-22 DIAGNOSIS — N183 Chronic kidney disease, stage 3 (moderate): Secondary | ICD-10-CM | POA: Diagnosis not present

## 2018-04-22 DIAGNOSIS — Z96651 Presence of right artificial knee joint: Secondary | ICD-10-CM | POA: Diagnosis not present

## 2018-04-22 DIAGNOSIS — M1712 Unilateral primary osteoarthritis, left knee: Secondary | ICD-10-CM | POA: Diagnosis not present

## 2018-04-22 DIAGNOSIS — E1165 Type 2 diabetes mellitus with hyperglycemia: Secondary | ICD-10-CM | POA: Diagnosis not present

## 2018-04-22 DIAGNOSIS — Z7982 Long term (current) use of aspirin: Secondary | ICD-10-CM | POA: Diagnosis not present

## 2018-04-22 DIAGNOSIS — Z7902 Long term (current) use of antithrombotics/antiplatelets: Secondary | ICD-10-CM | POA: Diagnosis not present

## 2018-04-22 DIAGNOSIS — E1122 Type 2 diabetes mellitus with diabetic chronic kidney disease: Secondary | ICD-10-CM | POA: Diagnosis not present

## 2018-04-22 LAB — BASIC METABOLIC PANEL
ANION GAP: 10 (ref 5–15)
BUN: 39 mg/dL — ABNORMAL HIGH (ref 8–23)
CO2: 25 mmol/L (ref 22–32)
Calcium: 8.5 mg/dL — ABNORMAL LOW (ref 8.9–10.3)
Chloride: 103 mmol/L (ref 98–111)
Creatinine, Ser: 1.48 mg/dL — ABNORMAL HIGH (ref 0.44–1.00)
GFR calc non Af Amer: 32 mL/min — ABNORMAL LOW (ref >60)
GFR, EST AFRICAN AMERICAN: 37 mL/min — AB (ref >60)
GLUCOSE: 430 mg/dL — AB (ref 70–99)
Potassium: 4.7 mmol/L (ref 3.5–5.1)
Sodium: 138 mmol/L (ref 135–145)

## 2018-04-22 LAB — CBC
HEMATOCRIT: 33.6 % — AB (ref 36.0–46.0)
HEMOGLOBIN: 10.7 g/dL — AB (ref 12.0–15.0)
MCH: 30.6 pg (ref 26.0–34.0)
MCHC: 31.8 g/dL (ref 30.0–36.0)
MCV: 96 fL (ref 80.0–100.0)
NRBC: 0 % (ref 0.0–0.2)
Platelets: 194 10*3/uL (ref 150–400)
RBC: 3.5 MIL/uL — ABNORMAL LOW (ref 3.87–5.11)
RDW: 14.6 % (ref 11.5–15.5)
WBC: 8.9 10*3/uL (ref 4.0–10.5)

## 2018-04-22 LAB — GLUCOSE, CAPILLARY: Glucose-Capillary: 363 mg/dL — ABNORMAL HIGH (ref 70–99)

## 2018-04-22 MED ORDER — OXYCODONE HCL 5 MG PO TABS: 5.0000 mg | ORAL_TABLET | Freq: Four times a day (QID) | ORAL | 0 refills | Status: DC | PRN

## 2018-04-22 MED ORDER — METHOCARBAMOL 500 MG PO TABS: 500.0000 mg | ORAL_TABLET | Freq: Four times a day (QID) | ORAL | 0 refills | Status: DC | PRN

## 2018-04-22 MED ORDER — ASPIRIN 325 MG PO TBEC: 325.0000 mg | DELAYED_RELEASE_TABLET | Freq: Every day | ORAL | 0 refills | Status: DC

## 2018-04-22 NOTE — Progress Notes (Signed)
Physical Therapy Treatment Patient Details Name: Carol [name] MRN: [id number] DOB: 05-19-1936 Today's Date: 04/22/2018    History of Present Illness 82 YO female s/p L TKR on 04/21/18. 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     PT Comments    POD # 1 pm session Daughter present during session.  Practiced one step twice and amb a greater distance in hallway.  Reviewed all TKR TE's following HEP handout.  Also instructed on use of ICE. Pt has met goals to D/C to home.   Follow Up Recommendations  Follow surgeon's recommendation for DC plan and follow-up therapies;Supervision for mobility/OOB(HH PT)     Equipment Recommendations  None recommended by PT    Recommendations for Other Services       Precautions / Restrictions Precautions Precautions: Knee;Fall Restrictions Weight Bearing Restrictions: No Other Position/Activity Restrictions: WBAT    Mobility  Bed Mobility               General bed mobility comments: OOB in recliner   Transfers Overall transfer level: Needs assistance Equipment used: Rolling walker (2 wheeled) Transfers: Sit to/from Stand Sit to Stand: Supervision;Min guard         General transfer comment: increased timne and one VC safety with turns  Ambulation/Gait Ambulation/Gait assistance: Supervision;Min guard Gait Distance (Feet): 75 Feet Assistive device: Rolling walker (2 wheeled) Gait Pattern/deviations: Step-to pattern;Decreased stride length;Antalgic;Decreased stance time - left;Decreased weight shift to left Gait velocity: decr    General Gait Details: tolerated an increased time with 25% VC's on safety with turns   Stairs Stairs: Yes     Number of Stairs: 1 General stair comments: with daughter present, practiced one step twice    Wheelchair Mobility    Modified Rankin (Stroke Patients Only)       Balance                                             Cognition Arousal/Alertness: Awake/alert Behavior During Therapy: WFL for tasks assessed/performed Overall Cognitive Status: Within Functional Limits for tasks assessed                                 General Comments: very sharpe for her age      Exercises      General Comments        Pertinent Vitals/Pain Pain Assessment: 0-10 Pain Score: 4  Pain Location: L knee  Pain Descriptors / Indicators: Aching;Sore;Operative site guarding Pain Intervention(s): Premedicated before session;Monitored during session;Repositioned;Ice applied    Home Living                      Prior Function            PT Goals (current goals can now be found in the care plan section) Progress towards PT goals: Progressing toward goals    Frequency    7X/week      PT Plan Current plan remains appropriate    Co-evaluation              AM-PAC PT "6 Clicks" Daily Activity  Outcome Measure  Difficulty turning over in bed (including adjusting bedclothes, sheets and blankets)?: Unable Difficulty moving from  lying on back to sitting on the side of the bed? : Unable Difficulty sitting down on and standing up from a chair with arms (e.g., wheelchair, bedside commode, etc,.)?: Unable Help needed moving to and from a bed to chair (including a wheelchair)?: A Little Help needed walking in hospital room?: A Little Help needed climbing 3-5 steps with a railing? : A Lot 6 Click Score: 11    End of Session Equipment Utilized During Treatment: Gait belt Activity Tolerance: Patient tolerated treatment well Patient left: in chair;with call bell/phone within reach   PT Visit Diagnosis: Difficulty in walking, not elsewhere classified (R26.2);Other abnormalities of gait and mobility (R26.89)     Time: 1450-1515 PT Time Calculation (min) (ACUTE ONLY): 25 min  Charges:  $Gait Training: 8-22 mins $Therapeutic Activity:  8-22 mins                     Rica Koyanagi  PTA Acute  Rehabilitation Services Pager      [phone] Office      [phone]

## 2018-04-22 NOTE — Discharge Summary (Signed)
SPORTS MEDICINE & JOINT REPLACEMENT   Carol Mulch, MD   Carol Shadow, PA-C [place], [place], [place]  [address]                             [phone]  PATIENT ID: Carol [name]        MRN:  [id number]          DOB/AGE: Mar 09, 1936 / 82 y.o.    DISCHARGE SUMMARY  ADMISSION DATE:    04/21/2018 DISCHARGE DATE:   04/22/2018   ADMISSION DIAGNOSIS: S/P total knee replacement [Z96.659]    DISCHARGE DIAGNOSIS:  Left Knee Osteoarthritis    ADDITIONAL DIAGNOSIS: Active Problems:   S/P total knee replacement  Past Medical History:  Diagnosis Date  . Anemia    a. Noted on labs 02/2014 possibly procedurally related.  . Asthma   . CAD S/P percutaneous coronary angioplasty 01/22/2014   A. (12/2013): POBA - OM2 99% (very tortuous segment) - reduced ~ 50% & TIMI 3 flow, 80-90% stenosis in mid PDA, Irregularities <50% in mRCA, pLAD and prox LCx; b. 02/17/14: Moderate sized fixed defect in Lateral wall --> cath with OM2 restenosis & otherwise stable --> Xience Alpine DES x 2 (2.25 mm x 12 & 8 mm). c. NSTEMI 02/2014 s/p PTCA/balloon angioplasty only to small caliber PDA.  . Carotid stenosis    Carotid US 11/17: L 1-39; FU prn  . Chronic systolic heart failure ([place])    a. ICM b. ECHO (01/2014): EF 25-30%, grade I DD, trivial MR. c. EF by Myoview 02/17/14 = ~53%. d. EF by cath 02/24/14 - improved to 50-55%.  . CKD (chronic kidney disease), stage III ([place])   . Contrast media allergy   . Environmental allergies   . GERD (gastroesophageal reflux disease)   . Gout, unspecified   . HOH (hard of hearing)   . Hyperlipidemia    a. H/o intolerance to Zocor, Lipitor. Had muscle aches on higher dose Crestor.  . Hypertension   . Hypothyroidism   . LBBB (left bundle branch block)    a. Transient during 02/2014 admission.  . NSTEMI (non-ST elevated myocardial infarction) ([place]) 2014; 12/2013, 01/2014  . QT prolongation    a. Noted on EKG 02/2014.  Marland Kitchen Sinus bradycardia    a. HR 40s-50s during  02/2014 admission, limiting BB dose.  Marland Kitchen TIA (transient ischemic attack) 1990's   a. TIA vs stroke 1990's "mild" - sounds like a TIA as she was told she had stroke symptoms but negative scans. Denies residual effects.  . Type II diabetes mellitus ([place])     PROCEDURE: Procedure(s): LEFT TOTAL KNEE ARTHROPLASTY on 04/21/2018  CONSULTS:    HISTORY:  See H&P in chart  HOSPITAL COURSE:  Carol [name] is a 82 y.o. admitted on 04/21/2018 and found to have a diagnosis of Left Knee Osteoarthritis.  After appropriate laboratory studies were obtained  they were taken to the operating room on 04/21/2018 and underwent Procedure(s): LEFT TOTAL KNEE ARTHROPLASTY.   They were given perioperative antibiotics:  Anti-infectives (From admission, onward)   Start     Dose/Rate Route Frequency Ordered Stop   04/21/18 1930  ceFAZolin (ANCEF) IVPB 2g/100 mL premix     2 g 200 mL/hr over 30 Minutes Intravenous Every 6 hours 04/21/18 1648 04/22/18 0145   04/21/18 1030  ceFAZolin (ANCEF) IVPB 2g/100 mL premix     2 g 200 mL/hr over 30 Minutes Intravenous  On call to O.R. 04/21/18 1023 04/21/18 1315   04/21/18 1030  ceFAZolin (ANCEF) 3 g in dextrose 5 % 50 mL IVPB  Status:  Discontinued     3 g 100 mL/hr over 30 Minutes Intravenous On call to O.R. 04/21/18 1023 04/21/18 1025    .  Patient given tranexamic acid IV or topical and exparel intra-operatively.  Tolerated the procedure well.    POD# 1: Vital signs were stable.  Patient denied Chest pain, shortness of breath, or calf pain.  Patient was started on Aspirin twice daily at 8am.  Consults to PT, OT, and care management were made.  The patient was weight bearing as tolerated.  CPM was placed on the operative leg 0-90 degrees for 6-8 hours a day. When out of the CPM, patient was placed in the foam block to achieve full extension. Incentive spirometry was taught.  Dressing was changed.       POD #2, Continued  PT for ambulation and exercise program.  IV  saline locked.  O2 discontinued.    The remainder of the hospital course was dedicated to ambulation and strengthening.   The patient was discharged on 1 Day Post-Op in  Good condition.  Blood products given:none  DIAGNOSTIC STUDIES: Recent vital signs:  Patient Vitals for the past 24 hrs:  BP Temp Temp src Pulse Resp SpO2 Height Weight  04/22/18 1031 (!) 141/56 98 F (36.7 C) Oral 65 - 94 % - -  04/22/18 0624 (!) 157/57 98 F (36.7 C) Oral 65 - 99 % - -  04/22/18 0109 (!) 145/59 (!) 97.4 F (36.3 C) Oral 64 - 97 % - -  04/21/18 2108 (!) 170/60 97.6 F (36.4 C) Oral 67 - 95 % - -  04/21/18 1953 (!) 181/67 - - 66 - 93 % - -  04/21/18 1857 (!) 173/67 97.7 F (36.5 C) Oral 66 16 100 % - -  04/21/18 1734 (!) 175/64 97.6 F (36.4 C) Oral (!) 57 14 100 % - -  04/21/18 1645 (!) 165/78 97.8 F (36.6 C) Oral 64 18 99 % 5\' 7"  (1.702 m) 100.2 kg  04/21/18 1644 (!) 165/78 - - 64 18 99 % - -  04/21/18 1615 (!) 161/72 97.8 F (36.6 C) - 60 12 99 % - -  04/21/18 1600 (!) 158/67 - - (!) 57 (!) 9 99 % - -  04/21/18 1545 134/69 - - (!) 53 12 100 % - -  04/21/18 1530 (!) 150/66 - - (!) 52 14 100 % - -  04/21/18 1515 (!) 148/64 (!) 97.4 F (36.3 C) - (!) 52 12 100 % - -  04/21/18 1500 133/62 (!) 95 F (35 C) - (!) 47 (!) 24 100 % - -  04/21/18 1445 (!) 110/53 - - (!) 52 14 100 % - -  04/21/18 1442 (!) 110/51 (!) 96.3 F (35.7 C) - (!) 54 15 100 % - -       Recent laboratory studies: Recent Labs    04/22/18 0531  WBC 8.9  HGB 10.7*  HCT 33.6*  PLT 194   Recent Labs    04/22/18 0531  NA 138  K 4.7  CL 103  CO2 25  BUN 39*  CREATININE 1.48*  GLUCOSE 430*  CALCIUM 8.5*   Lab Results  Component Value Date   INR 0.94 04/21/2018   INR 1.28 04/25/2015   INR 1.05 04/19/2015     Recent Radiographic Studies :  No  results found.  DISCHARGE INSTRUCTIONS: Discharge Instructions    CPM   Complete by:  As directed    Continuous passive motion machine (CPM):      Use the CPM  from 0 to 90 for 4-6 hours per day.      You may increase by 10 per day.  You may break it up into 2 or 3 sessions per day.      Use CPM for 2 weeks or until you are told to stop.   Call MD / Call 911   Complete by:  As directed    If you experience chest pain or shortness of breath, CALL 911 and be transported to the hospital emergency room.  If you develope a fever above 101 F, pus (white drainage) or increased drainage or redness at the wound, or calf pain, call your surgeon's office.   Constipation Prevention   Complete by:  As directed    Drink plenty of fluids.  Prune juice may be helpful.  You may use a stool softener, such as Colace (over the counter) 100 mg twice a day.  Use MiraLax (over the counter) for constipation as needed.   Diet - low sodium heart healthy   Complete by:  As directed    Discharge instructions   Complete by:  As directed    INSTRUCTIONS AFTER JOINT REPLACEMENT   Remove items at home which could result in a fall. This includes throw rugs or furniture in walking pathways ICE to the affected joint every three hours while awake for 30 minutes at a time, for at least the first 3-5 days, and then as needed for pain and swelling.  Continue to use ice for pain and swelling. You may notice swelling that will progress down to the foot and ankle.  This is normal after surgery.  Elevate your leg when you are not up walking on it.   Continue to use the breathing machine you got in the hospital (incentive spirometer) which will help keep your temperature down.  It is common for your temperature to cycle up and down following surgery, especially at night when you are not up moving around and exerting yourself.  The breathing machine keeps your lungs expanded and your temperature down.   DIET:  As you were doing prior to hospitalization, we recommend a well-balanced diet.  DRESSING / WOUND CARE / SHOWERING  Keep the surgical dressing until follow up.  The dressing is water  proof, so you can shower without any extra covering.  IF THE DRESSING FALLS OFF or the wound gets wet inside, change the dressing with sterile gauze.  Please use good hand washing techniques before changing the dressing.  Do not use any lotions or creams on the incision until instructed by your surgeon.    ACTIVITY  Increase activity slowly as tolerated, but follow the weight bearing instructions below.   No driving for 6 weeks or until further direction given by your physician.  You cannot drive while taking narcotics.  No lifting or carrying greater than 10 lbs. until further directed by your surgeon. Avoid periods of inactivity such as sitting longer than an hour when not asleep. This helps prevent blood clots.  You may return to work once you are authorized by your doctor.     WEIGHT BEARING   Weight bearing as tolerated with assist device (walker, cane, etc) as directed, use it as long as suggested by your surgeon or therapist, typically at least 4-6  weeks.   EXERCISES  Results after joint replacement surgery are often greatly improved when you follow the exercise, range of motion and muscle strengthening exercises prescribed by your doctor. Safety measures are also important to protect the joint from further injury. Any time any of these exercises cause you to have increased pain or swelling, decrease what you are doing until you are comfortable again and then slowly increase them. If you have problems or questions, call your caregiver or physical therapist for advice.   Rehabilitation is important following a joint replacement. After just a few days of immobilization, the muscles of the leg can become weakened and shrink (atrophy).  These exercises are designed to build up the tone and strength of the thigh and leg muscles and to improve motion. Often times heat used for twenty to thirty minutes before working out will loosen up your tissues and help with improving the range of motion but  do not use heat for the first two weeks following surgery (sometimes heat can increase post-operative swelling).   These exercises can be done on a training (exercise) mat, on the floor, on a table or on a bed. Use whatever works the best and is most comfortable for you.    Use music or television while you are exercising so that the exercises are a pleasant break in your day. This will make your life better with the exercises acting as a break in your routine that you can look forward to.   Perform all exercises about fifteen times, three times per day or as directed.  You should exercise both the operative leg and the other leg as well.   Exercises include:   Quad Sets - Tighten up the muscle on the front of the thigh (Quad) and hold for 5-10 seconds.   Straight Leg Raises - With your knee straight (if you were given a brace, keep it on), lift the leg to 60 degrees, hold for 3 seconds, and slowly lower the leg.  Perform this exercise against resistance later as your leg gets stronger.  Leg Slides: Lying on your back, slowly slide your foot toward your buttocks, bending your knee up off the floor (only go as far as is comfortable). Then slowly slide your foot back down until your leg is flat on the floor again.  Angel Wings: Lying on your back spread your legs to the side as far apart as you can without causing discomfort.  Hamstring Strength:  Lying on your back, push your heel against the floor with your leg straight by tightening up the muscles of your buttocks.  Repeat, but this time bend your knee to a comfortable angle, and push your heel against the floor.  You may put a pillow under the heel to make it more comfortable if necessary.   A rehabilitation program following joint replacement surgery can speed recovery and prevent re-injury in the future due to weakened muscles. Contact your doctor or a physical therapist for more information on knee rehabilitation.    CONSTIPATION  Constipation  is defined medically as fewer than three stools per week and severe constipation as less than one stool per week.  Even if you have a regular bowel pattern at home, your normal regimen is likely to be disrupted due to multiple reasons following surgery.  Combination of anesthesia, postoperative narcotics, change in appetite and fluid intake all can affect your bowels.   YOU MUST use at least one of the following options; they are  listed in order of increasing strength to get the job done.  They are all available over the counter, and you may need to use some, POSSIBLY even all of these options:    Drink plenty of fluids (prune juice may be helpful) and high fiber foods Colace 100 mg by mouth twice a day  Senokot for constipation as directed and as needed Dulcolax (bisacodyl), take with full glass of water  Miralax (polyethylene glycol) once or twice a day as needed.  If you have tried all these things and are unable to have a bowel movement in the first 3-4 days after surgery call either your surgeon or your primary doctor.    If you experience loose stools or diarrhea, hold the medications until you stool forms back up.  If your symptoms do not get better within 1 week or if they get worse, check with your doctor.  If you experience "the worst abdominal pain ever" or develop nausea or vomiting, please contact the office immediately for further recommendations for treatment.   ITCHING:  If you experience itching with your medications, try taking only a single pain pill, or even half a pain pill at a time.  You can also use Benadryl over the counter for itching or also to help with sleep.   TED HOSE STOCKINGS:  Use stockings on both legs until for at least 2 weeks or as directed by physician office. They may be removed at night for sleeping.  MEDICATIONS:  See your medication summary on the "After Visit Summary" that nursing will review with you.  You may have some home medications which will be  placed on hold until you complete the course of blood thinner medication.  It is important for you to complete the blood thinner medication as prescribed.  PRECAUTIONS:  If you experience chest pain or shortness of breath - call 911 immediately for transfer to the hospital emergency department.   If you develop a fever greater that 101 F, purulent drainage from wound, increased redness or drainage from wound, foul odor from the wound/dressing, or calf pain - CONTACT YOUR SURGEON.                                                   FOLLOW-UP APPOINTMENTS:  If you do not already have a post-op appointment, please call the office for an appointment to be seen by your surgeon.  Guidelines for how soon to be seen are listed in your "After Visit Summary", but are typically between 1-4 weeks after surgery.  OTHER INSTRUCTIONS:   Knee Replacement:  Do not place pillow under knee, focus on keeping the knee straight while resting. CPM instructions: 0-90 degrees, 2 hours in the morning, 2 hours in the afternoon, and 2 hours in the evening. Place foam block, curve side up under heel at all times except when in CPM or when walking.  DO NOT modify, tear, cut, or change the foam block in any way.  MAKE SURE YOU:  Understand these instructions.  Get help right away if you are not doing well or get worse.    Thank you for letting us be a part of your medical care team.  It is a privilege we respect greatly.  We hope these instructions will help you stay on track for a fast and full recovery!  Increase activity slowly as tolerated   Complete by:  As directed       DISCHARGE MEDICATIONS:   Allergies as of 04/22/2018      Reactions   Contrast Media [iodinated Diagnostic Agents] Other (See Comments)   unknown   Cortizone-10 [hydrocortisone] Other (See Comments)   unknown   Lipitor [atorvastatin]    Muscle aches- severe   Zocor [simvastatin]    Muscle aches- severe      Medication List    STOP taking  these medications   traMADol 50 MG tablet Commonly known as:  ULTRAM     TAKE these medications   allopurinol 300 MG tablet Commonly known as:  ZYLOPRIM Take 300 mg by mouth every morning.   aspirin 325 MG EC tablet Take 1 tablet (325 mg total) by mouth daily. What changed:    medication strength  how much to take   Biotin 5000 MCG Caps Take 5,000 mcg by mouth every morning.   carvedilol 6.25 MG tablet Commonly known as:  COREG Take 6.25 mg by mouth 2 (two) times daily with a meal.   clopidogrel 75 MG tablet Commonly known as:  PLAVIX Take 1 tablet (75 mg total) by mouth daily.   colchicine 0.6 MG tablet Take 0.6 mg by mouth daily as needed (gout flare up.).   famotidine 10 MG tablet Commonly known as:  PEPCID Take 10 mg by mouth 2 (two) times daily as needed for heartburn or indigestion.   furosemide 80 MG tablet Commonly known as:  LASIX Take 80 mg by mouth daily.   gabapentin 300 MG capsule Commonly known as:  NEURONTIN Take 300 mg by mouth 2 (two) times daily.   insulin NPH-regular Human (70-30) 100 UNIT/ML injection Commonly known as:  NOVOLIN 70/30 Inject 25-32 Units into the skin daily.   isosorbide mononitrate 60 MG 24 hr tablet Commonly known as:  IMDUR Take 1 tablet (60 mg total) by mouth daily.   levothyroxine 50 MCG tablet Commonly known as:  SYNTHROID, LEVOTHROID Take 50 mcg by mouth every morning.   methocarbamol 500 MG tablet Commonly known as:  ROBAXIN Take 1-2 tablets (500-1,000 mg total) by mouth every 6 (six) hours as needed for muscle spasms.   ONE TOUCH ULTRA TEST test strip Generic drug:  glucose blood 1 each by Other route as needed for other.   oxyCODONE 5 MG immediate release tablet Commonly known as:  Oxy IR/ROXICODONE Take 1-2 tablets (5-10 mg total) by mouth every 6 (six) hours as needed for moderate pain (pain score 4-6).   pantoprazole 40 MG tablet Commonly known as:  [place] 1 TABLET BY MOUTH ONCE DAILY             Durable Medical Equipment  (From admission, onward)         Start     Ordered   04/21/18 1649  DME Walker rolling  Once    Question:  Patient needs a walker to treat with the following condition  Answer:  S/P total knee replacement   04/21/18 1648   04/21/18 1649  DME 3 n 1  Once     04/21/18 1648   04/21/18 1649  DME Bedside commode  Once    Question:  Patient needs a bedside commode to treat with the following condition  Answer:  S/P total knee replacement   04/21/18 1648          FOLLOW UP VISIT:   [facility]  [place] Follow up.   Specialty:  [place] Why:  physical therapy Contact information: [address] [address] [phone]           DISPOSITION: HOME VS. SNF  CONDITION:  Good   Carol [name] 04/22/2018, 12:43 PM

## 2018-04-22 NOTE — Progress Notes (Signed)
Discharge paperwork discussed with pt and daughter at the bedside. Questions were answered, and they demonstrated understanding.  Pt was escorted in stable condition by wheelchair to main lobby.

## 2018-04-22 NOTE — Op Note (Signed)
TOTAL KNEE REPLACEMENT OPERATIVE NOTE:  04/21/2018  2:42 PM  PATIENT:  Carol [name]  82 y.o. female  PRE-OPERATIVE DIAGNOSIS:  Left Knee Osteoarthritis  POST-OPERATIVE DIAGNOSIS:  Left Knee Osteoarthritis  PROCEDURE:  Procedure(s): LEFT TOTAL KNEE ARTHROPLASTY  SURGEON:  Surgeon(s): Vickey Huger, MD  PHYSICIAN ASSISTANT: Carlyon Shadow, PA-C  ANESTHESIA:   spinal  SPECIMEN: None  COUNTS:  Correct  TOURNIQUET:   Total Tourniquet Time Documented: Thigh (Left) - 39 minutes Total: Thigh (Left) - 39 minutes   DICTATION:  Indication for procedure:    The patient is a 82 y.o. female who has failed conservative treatment for Left Knee Osteoarthritis.  Informed consent was obtained prior to anesthesia. The risks versus benefits of the operation were explain and in a way the patient can, and did, understand.   On the implant demand matching protocol, this patient scored 8.  Therefore, this patient was not receive a polyethylene insert with vitamin E which is a high demand implant.  Description of procedure:     The patient was taken to the operating room and placed under anesthesia.  The patient was positioned in the usual fashion taking care that all body parts were adequately padded and/or protected.  A tourniquet was applied and the leg prepped and draped in the usual sterile fashion.  The extremity was exsanguinated with the esmarch and tourniquet inflated to 350 mmHg.  Pre-operative range of motion was normal.  The knee was in 5 degree of mild valgus.  A midline incision approximately 6-7 inches long was made with a #10 blade.  A new blade was used to make a parapatellar arthrotomy going 2-3 cm into the quadriceps tendon, over the patella, and alongside the medial aspect of the patellar tendon.  A synovectomy was then performed with the #10 blade and forceps. I then elevated the deep MCL off the medial tibial metaphysis subperiosteally around to the semimembranosus  attachment.    I everted the patella and used calipers to measure patellar thickness.  I used the reamer to ream down to appropriate thickness to recreate the native thickness.  I then removed excess bone with the rongeur and sagittal saw.  I used the appropriately sized template and drilled the three lug holes.  I then put the trial in place and measured the thickness with the calipers to ensure recreation of the native thickness.  The trial was then removed and the patella subluxed and the knee brought into flexion.  A homan retractor was place to retract and protect the patella and lateral structures.  A Z-retractor was place medially to protect the medial structures.  The extra-medullary alignment system was used to make cut the tibial articular surface perpendicular to the anamotic axis of the tibia and in 3 degrees of posterior slope.  The cut surface and alignment jig was removed.  I then used the intramedullary alignment guide to make a 4 valgus cut on the distal femur.  I then marked out the epicondylar axis on the distal femur.  The posterior condylar axis measured 3 degrees.  I then used the anterior referencing sizer and measured the femur to be a size 8.  The 4-In-1 cutting block was screwed into place in external rotation matching the posterior condylar angle, making our cuts perpendicular to the epicondylar axis.  Anterior, posterior and chamfer cuts were made with the sagittal saw.  The cutting block and cut pieces were removed.  A lamina spreader was placed in 90 degrees of flexion.  The ACL, PCL, menisci, and posterior condylar osteophytes were removed.  A 10 mm spacer blocked was found to offer good flexion and extension gap balance after mild in degree releasing.   The scoop retractor was then placed and the femoral finishing block was pinned in place.  The small sagittal saw was used as well as the lug drill to finish the femur.  The block and cut surfaces were removed and the medullary  canal hole filled with autograft bone from the cut pieces.  The tibia was delivered forward in deep flexion and external rotation.  A size E tray was selected and pinned into place centered on the medial 1/3 of the tibial tubercle.  The reamer and keel was used to prepare the tibia through the tray.    I then trialed with the size 8 femur, size E tibia, a 11 mm insert and the 35 patella.  I had excellent flexion/extension gap balance, excellent patella tracking.  Flexion was full and beyond 120 degrees; extension was zero.  These components were chosen and the staff opened them to me on the back table while the knee was lavaged copiously and the cement mixed.  The soft tissue was infiltrated with 60cc of exparel 1.3% through a 21 gauge needle.  I cemented in the components and removed all excess cement.  The polyethylene tibial component was snapped into place and the knee placed in extension while cement was hardening.  The capsule was infilltrated with a 60cc exparel/marcaine/saline mixture.   Once the cement was hard, the tourniquet was let down.  Hemostasis was obtained.  The arthrotomy was closed using a #1 stratofix running suture.  The deep soft tissues were closed with #0 vicryls and the subcuticular layer closed with #2-0 vicryl.  The skin was reapproximated and closed with 3.0 Monocryl.  The wound was covered with steristrips, aquacel dressing, and a TED stocking.   The patient was then awakened, extubated, and taken to the recovery room in stable condition.  BLOOD LOSS:  707EM COMPLICATIONS:  None.  PLAN OF CARE: Admit for overnight observation  PATIENT DISPOSITION:  PACU - hemodynamically stable.   Delay start of Pharmacological VTE agent (>24hrs) due to surgical blood loss or risk of bleeding:  not applicable  Please fax a copy of this op note to my office at [phone] (please only include page 1 and 2 of the Case Information op note)

## 2018-04-22 NOTE — Progress Notes (Signed)
Physical Therapy Treatment Patient Details Name: Carol [name] MRN: [id number] DOB: 1935/10/20 Today's Date: 04/22/2018    History of Present Illness 82 YO female s/p L TKR on 04/21/18. 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     PT Comments    POD #1 AM SESSION Assisted with amb a greater distance and performed all supine TKR TE's following HEP.  Instructed on proper tech, freq as well as use of ICE.    Follow Up Recommendations  Follow surgeon's recommendation for DC plan and follow-up therapies;Supervision for mobility/OOB(HH PT)     Equipment Recommendations  None recommended by PT    Recommendations for Other Services       Precautions / Restrictions Precautions Precautions: Knee;Fall Restrictions Weight Bearing Restrictions: No Other Position/Activity Restrictions: WBAT    Mobility  Bed Mobility               General bed mobility comments: OOB in recliner   Transfers Overall transfer level: Needs assistance Equipment used: Rolling walker (2 wheeled) Transfers: Sit to/from Stand Sit to Stand: Supervision;Min guard         General transfer comment: increased timne and one VC safety with turns  Ambulation/Gait Ambulation/Gait assistance: Supervision;Min guard Gait Distance (Feet): 55 Feet Assistive device: Rolling walker (2 wheeled) Gait Pattern/deviations: Step-to pattern;Decreased stride length;Antalgic;Decreased stance time - left;Decreased weight shift to left Gait velocity: decr    General Gait Details: tolerated an increased time with 25% VC's on safety with turns   Marine scientist [place] (Stroke Patients Only)       Balance                                            Cognition Arousal/Alertness: Awake/alert Behavior During Therapy: WFL for tasks assessed/performed Overall  Cognitive Status: Within Functional Limits for tasks assessed                                 General Comments: very sharpe for her age      Exercises   Total Knee Replacement TE's 10 reps B LE ankle pumps 10 reps towel squeezes 10 reps knee presses 10 reps heel slides  10 reps SAQ's 10 reps SLR's 10 reps ABD Followed by ICE     General Comments        Pertinent Vitals/Pain Pain Assessment: 0-10 Pain Score: 4  Pain Location: L knee  Pain Descriptors / Indicators: Aching;Sore;Operative site guarding Pain Intervention(s): Premedicated before session;Monitored during session;Repositioned;Ice applied    Home Living                      Prior Function            PT Goals (current goals can now be found in the care plan section) Progress towards PT goals: Progressing toward goals    Frequency    7X/week      PT Plan Current plan remains appropriate    Co-evaluation              AM-PAC PT "6 Clicks" Daily Activity  Outcome Measure  Difficulty turning over  in bed (including adjusting bedclothes, sheets and blankets)?: Unable Difficulty moving from lying on back to sitting on the side of the bed? : Unable Difficulty sitting down on and standing up from a chair with arms (e.g., wheelchair, bedside commode, etc,.)?: Unable Help needed moving to and from a bed to chair (including a wheelchair)?: A Little Help needed walking in hospital room?: A Little Help needed climbing 3-5 steps with a railing? : A Lot 6 Click Score: 11    End of Session Equipment Utilized During Treatment: Gait belt Activity Tolerance: Patient tolerated treatment well Patient left: in chair;with call bell/phone within reach   PT Visit Diagnosis: Difficulty in walking, not elsewhere classified (R26.2);Other abnormalities of gait and mobility (R26.89)     Time: 1145-1200 PT Time Calculation (min) (ACUTE ONLY): 15 min  Charges:  $Gait Training: 8-22 mins                      Rica Koyanagi  PTA Acute  Rehabilitation Services Pager      [phone] Office      [phone]

## 2018-04-22 NOTE — Progress Notes (Signed)
SPORTS MEDICINE AND JOINT REPLACEMENT  Lara Mulch, MD    Carlyon Shadow, PA-C [address], [place], [place]  [id number]                             [phone]   PROGRESS NOTE  Subjective:  negative for Chest Pain  negative for Shortness of Breath  negative for Nausea/Vomiting   negative for Calf Pain  negative for Bowel Movement   Tolerating Diet: yes         Patient reports pain as 3 on 0-10 scale.    Objective: Vital signs in last 24 hours:    Patient Vitals for the past 24 hrs:  BP Temp Temp src Pulse Resp SpO2 Height Weight  04/22/18 0624 (!) 157/57 98 F (36.7 C) Oral 65 - 99 % - -  04/22/18 0109 (!) 145/59 (!) 97.4 F (36.3 C) Oral 64 - 97 % - -  04/21/18 2108 (!) 170/60 97.6 F (36.4 C) Oral 67 - 95 % - -  04/21/18 1953 (!) 181/67 - - 66 - 93 % - -  04/21/18 1857 (!) 173/67 97.7 F (36.5 C) Oral 66 16 100 % - -  04/21/18 1734 (!) 175/64 97.6 F (36.4 C) Oral (!) 57 14 100 % - -  04/21/18 1645 (!) 165/78 97.8 F (36.6 C) Oral 64 18 99 % 5\' 7"  (1.702 m) 100.2 kg  04/21/18 1644 (!) 165/78 - - 64 18 99 % - -  04/21/18 1615 (!) 161/72 97.8 F (36.6 C) - 60 12 99 % - -  04/21/18 1600 (!) 158/67 - - (!) 57 (!) 9 99 % - -  04/21/18 1545 134/69 - - (!) 53 12 100 % - -  04/21/18 1530 (!) 150/66 - - (!) 52 14 100 % - -  04/21/18 1515 (!) 148/64 (!) 97.4 F (36.3 C) - (!) 52 12 100 % - -  04/21/18 1500 133/62 (!) 95 F (35 C) - (!) 47 (!) 24 100 % - -  04/21/18 1445 (!) 110/53 - - (!) 52 14 100 % - -  04/21/18 1442 (!) 110/51 (!) 96.3 F (35.7 C) - (!) 54 15 100 % - -  04/21/18 1201 - - - (!) 59 15 100 % - -  04/21/18 1200 - - - (!) 59 14 100 % - -  04/21/18 1159 (!) 162/67 - - (!) 59 14 100 % - -  04/21/18 1158 - - - (!) 58 10 100 % - -  04/21/18 1157 - - - (!) 59 12 100 % - -  04/21/18 1156 - - - 61 17 100 % - -  04/21/18 1155 - - - 60 16 100 % - -  04/21/18 1154 135/78 - - 61 12 100 % - -  04/21/18 1153 - - - 61 10 100 % - -  04/21/18 1152 - - - 60  14 100 % - -  04/21/18 1151 - - - 62 15 100 % - -  04/21/18 1150 - - - 61 17 100 % - -  04/21/18 1149 (!) 179/59 - - 62 15 100 % - -  04/21/18 1148 - - - 62 15 100 % - -  04/21/18 1147 - - - 62 15 100 % - -  04/21/18 1146 - - - 61 18 100 % - -  04/21/18 1145 - - - 61 10 99 % - -  04/21/18 1144 (!) 166/64 - - 62 10 100 % - -  04/21/18 1143 - - - 63 14 100 % - -  04/21/18 1142 - - - 66 12 98 % - -  04/21/18 1141 - - - 66 14 99 % - -  04/21/18 1140 - - - 62 13 100 % - -  04/21/18 1139 - - - (!) 59 11 98 % - -  04/21/18 1050 - - - - - - 5' 7.5" (1.715 m) 100.2 kg  04/21/18 1019 (!) 170/70 98.7 F (37.1 C) Oral 65 16 99 % - -    @flow {1959:LAST@   Intake/Output from previous day:   10/28 0701 - 10/29 0700 In: 3579.5 [P.O.:840; I.V.:2539.5] Out: 4251 [Urine:4201]   Intake/Output this shift:   No intake/output data recorded.   Intake/Output      10/28 0701 - 10/29 0700 10/29 0701 - 10/30 0700   P.O. 840    I.V. (mL/kg) 2539.5 (25.3)    IV Piggyback 200    Total Intake(mL/kg) 3579.5 (35.7)    Urine (mL/kg/hr) 4201    Blood 50    Total Output 4251    Net -671.5            LABORATORY DATA: Recent Labs    04/15/18 1110 04/22/18 0531  WBC 6.6 8.9  HGB 11.3* 10.7*  HCT 36.5 33.6*  PLT 218 194   Recent Labs    04/15/18 1110 04/22/18 0531  NA 139 138  K 4.6 4.7  CL 102 103  CO2 29 25  BUN 33* 39*  CREATININE 1.30* 1.48*  GLUCOSE 267* 430*  CALCIUM 9.3 8.5*   Lab Results  Component Value Date   INR 0.94 04/21/2018   INR 1.28 04/25/2015   INR 1.05 04/19/2015    Examination:  General appearance: alert, cooperative and no distress Extremities: extremities normal, atraumatic, no cyanosis or edema  Wound Exam: clean, dry, intact   Drainage:  None: wound tissue dry  Motor Exam: Quadriceps and Hamstrings Intact  Sensory Exam: Superficial Peroneal, Deep Peroneal and Tibial normal   Assessment:    1 Day Post-Op  Procedure(s) (LRB): LEFT TOTAL KNEE  ARTHROPLASTY (Left)  ADDITIONAL DIAGNOSIS:  Active Problems:   S/P total knee replacement     Plan: Physical Therapy as ordered Weight Bearing as Tolerated (WBAT)  DVT Prophylaxis:  Aspirin  DISCHARGE PLAN: Home  DISCHARGE NEEDS: HHPT     Patient doing well, expected D/C home today  Patient's anticipated LOS is less than 2 midnights, meeting these requirements: - Lives within 1 hour of care - Has a competent adult at home to recover with post-op recover - NO history of  - Chronic pain requiring opiods  - Diabetes  - Coronary Artery Disease  - Heart failure  - Heart attack  - Stroke  - DVT/VTE  - Cardiac arrhythmia  - Respiratory Failure/COPD  - Renal failure  - Anemia  - Advanced Liver disease        Donia Ast 04/22/2018, 7:09 AM

## 2018-04-22 NOTE — Care Management Obs Status (Signed)
[facility] NOTIFICATION   Patient Details  Name: Carol [name] MRN: [id number] Date of Birth: 12-13-1935   Medicare Observation Status Notification Given:  Yes    Guadalupe Maple, RN 04/22/2018, 10:56 AM

## 2018-04-22 NOTE — Care Management Obs Status (Signed)
[facility] NOTIFICATION   Patient Details  Name: CHIDERA DEARCOS MRN: [id number] Date of Birth: 1935/08/30   Medicare Observation Status Notification Given:       Guadalupe Maple, RN 04/22/2018, 10:56 AM

## 2018-04-22 NOTE — Care Management Note (Signed)
Case Management Note  Patient Details  Name: ASHA GRUMBINE MRN: [id number] Date of Birth: [birth year]  Subjective/Objective:  Discharge planning, spoke with patient at beside. [place] for [facility] services, PT to eval and treat.   Action/Plan: [facility] for the referral, they have accepted. Patient has DME.  Expected Discharge Date:                  Expected Discharge Plan:  [place]  In-House Referral:  NA  Discharge planning Services  CM Consult  Post Acute Care Choice:  Home Health Choice offered to:  Patient  DME Arranged:  N/A DME Agency:  NA  HH Arranged:  PT HH Agency:  [facility]  Status of Service:  Completed, signed off  If discussed at [place] of Stay Meetings, dates discussed:    Additional Comments:  Guadalupe Maple, RN 04/22/2018, 11:10 AM

## 2018-04-22 NOTE — Plan of Care (Signed)
Pt alert and oriented, resting with no complaints this am. Plan of care discussed with pt, RN will monitor.

## 2018-04-23 DIAGNOSIS — I5020 Unspecified systolic (congestive) heart failure: Secondary | ICD-10-CM | POA: Diagnosis not present

## 2018-04-23 DIAGNOSIS — I252 Old myocardial infarction: Secondary | ICD-10-CM | POA: Diagnosis not present

## 2018-04-23 DIAGNOSIS — D649 Anemia, unspecified: Secondary | ICD-10-CM | POA: Diagnosis not present

## 2018-04-23 DIAGNOSIS — K769 Liver disease, unspecified: Secondary | ICD-10-CM | POA: Diagnosis not present

## 2018-04-23 DIAGNOSIS — E039 Hypothyroidism, unspecified: Secondary | ICD-10-CM | POA: Diagnosis not present

## 2018-04-23 DIAGNOSIS — Z7982 Long term (current) use of aspirin: Secondary | ICD-10-CM | POA: Diagnosis not present

## 2018-04-23 DIAGNOSIS — I2510 Atherosclerotic heart disease of native coronary artery without angina pectoris: Secondary | ICD-10-CM | POA: Diagnosis not present

## 2018-04-23 DIAGNOSIS — I130 Hypertensive heart and chronic kidney disease with heart failure and stage 1 through stage 4 chronic kidney disease, or unspecified chronic kidney disease: Secondary | ICD-10-CM | POA: Diagnosis not present

## 2018-04-23 DIAGNOSIS — E1122 Type 2 diabetes mellitus with diabetic chronic kidney disease: Secondary | ICD-10-CM | POA: Diagnosis not present

## 2018-04-23 DIAGNOSIS — K219 Gastro-esophageal reflux disease without esophagitis: Secondary | ICD-10-CM | POA: Diagnosis not present

## 2018-04-23 DIAGNOSIS — J45909 Unspecified asthma, uncomplicated: Secondary | ICD-10-CM | POA: Diagnosis not present

## 2018-04-23 DIAGNOSIS — Z7952 Long term (current) use of systemic steroids: Secondary | ICD-10-CM | POA: Diagnosis not present

## 2018-04-23 DIAGNOSIS — Z96652 Presence of left artificial knee joint: Secondary | ICD-10-CM | POA: Diagnosis not present

## 2018-04-23 DIAGNOSIS — M109 Gout, unspecified: Secondary | ICD-10-CM | POA: Diagnosis not present

## 2018-04-23 DIAGNOSIS — Z794 Long term (current) use of insulin: Secondary | ICD-10-CM | POA: Diagnosis not present

## 2018-04-23 DIAGNOSIS — Z471 Aftercare following joint replacement surgery: Secondary | ICD-10-CM | POA: Diagnosis not present

## 2018-04-23 DIAGNOSIS — N183 Chronic kidney disease, stage 3 (moderate): Secondary | ICD-10-CM | POA: Diagnosis not present

## 2018-04-23 DIAGNOSIS — I447 Left bundle-branch block, unspecified: Secondary | ICD-10-CM | POA: Diagnosis not present

## 2018-04-24 DIAGNOSIS — J45909 Unspecified asthma, uncomplicated: Secondary | ICD-10-CM | POA: Diagnosis not present

## 2018-04-24 DIAGNOSIS — K769 Liver disease, unspecified: Secondary | ICD-10-CM | POA: Diagnosis not present

## 2018-04-24 DIAGNOSIS — I5020 Unspecified systolic (congestive) heart failure: Secondary | ICD-10-CM | POA: Diagnosis not present

## 2018-04-24 DIAGNOSIS — Z471 Aftercare following joint replacement surgery: Secondary | ICD-10-CM | POA: Diagnosis not present

## 2018-04-24 DIAGNOSIS — I2510 Atherosclerotic heart disease of native coronary artery without angina pectoris: Secondary | ICD-10-CM | POA: Diagnosis not present

## 2018-04-24 DIAGNOSIS — I130 Hypertensive heart and chronic kidney disease with heart failure and stage 1 through stage 4 chronic kidney disease, or unspecified chronic kidney disease: Secondary | ICD-10-CM | POA: Diagnosis not present

## 2018-04-24 DIAGNOSIS — Z794 Long term (current) use of insulin: Secondary | ICD-10-CM | POA: Diagnosis not present

## 2018-04-24 DIAGNOSIS — M109 Gout, unspecified: Secondary | ICD-10-CM | POA: Diagnosis not present

## 2018-04-24 DIAGNOSIS — N183 Chronic kidney disease, stage 3 (moderate): Secondary | ICD-10-CM | POA: Diagnosis not present

## 2018-04-24 DIAGNOSIS — Z96652 Presence of left artificial knee joint: Secondary | ICD-10-CM | POA: Diagnosis not present

## 2018-04-24 DIAGNOSIS — I252 Old myocardial infarction: Secondary | ICD-10-CM | POA: Diagnosis not present

## 2018-04-24 DIAGNOSIS — Z7982 Long term (current) use of aspirin: Secondary | ICD-10-CM | POA: Diagnosis not present

## 2018-04-24 DIAGNOSIS — E785 Hyperlipidemia, unspecified: Secondary | ICD-10-CM | POA: Diagnosis not present

## 2018-04-24 DIAGNOSIS — E119 Type 2 diabetes mellitus without complications: Secondary | ICD-10-CM | POA: Diagnosis not present

## 2018-04-24 DIAGNOSIS — E039 Hypothyroidism, unspecified: Secondary | ICD-10-CM | POA: Diagnosis not present

## 2018-04-24 DIAGNOSIS — I447 Left bundle-branch block, unspecified: Secondary | ICD-10-CM | POA: Diagnosis not present

## 2018-04-24 DIAGNOSIS — K219 Gastro-esophageal reflux disease without esophagitis: Secondary | ICD-10-CM | POA: Diagnosis not present

## 2018-04-24 DIAGNOSIS — D649 Anemia, unspecified: Secondary | ICD-10-CM | POA: Diagnosis not present

## 2018-04-24 DIAGNOSIS — E1122 Type 2 diabetes mellitus with diabetic chronic kidney disease: Secondary | ICD-10-CM | POA: Diagnosis not present

## 2018-04-24 DIAGNOSIS — Z7952 Long term (current) use of systemic steroids: Secondary | ICD-10-CM | POA: Diagnosis not present

## 2018-04-25 DIAGNOSIS — Z7982 Long term (current) use of aspirin: Secondary | ICD-10-CM | POA: Diagnosis not present

## 2018-04-25 DIAGNOSIS — I2510 Atherosclerotic heart disease of native coronary artery without angina pectoris: Secondary | ICD-10-CM | POA: Diagnosis not present

## 2018-04-25 DIAGNOSIS — E039 Hypothyroidism, unspecified: Secondary | ICD-10-CM | POA: Diagnosis not present

## 2018-04-25 DIAGNOSIS — K769 Liver disease, unspecified: Secondary | ICD-10-CM | POA: Diagnosis not present

## 2018-04-25 DIAGNOSIS — J45909 Unspecified asthma, uncomplicated: Secondary | ICD-10-CM | POA: Diagnosis not present

## 2018-04-25 DIAGNOSIS — I252 Old myocardial infarction: Secondary | ICD-10-CM | POA: Diagnosis not present

## 2018-04-25 DIAGNOSIS — Z794 Long term (current) use of insulin: Secondary | ICD-10-CM | POA: Diagnosis not present

## 2018-04-25 DIAGNOSIS — M109 Gout, unspecified: Secondary | ICD-10-CM | POA: Diagnosis not present

## 2018-04-25 DIAGNOSIS — N183 Chronic kidney disease, stage 3 (moderate): Secondary | ICD-10-CM | POA: Diagnosis not present

## 2018-04-25 DIAGNOSIS — Z96652 Presence of left artificial knee joint: Secondary | ICD-10-CM | POA: Diagnosis not present

## 2018-04-25 DIAGNOSIS — Z471 Aftercare following joint replacement surgery: Secondary | ICD-10-CM | POA: Diagnosis not present

## 2018-04-25 DIAGNOSIS — I130 Hypertensive heart and chronic kidney disease with heart failure and stage 1 through stage 4 chronic kidney disease, or unspecified chronic kidney disease: Secondary | ICD-10-CM | POA: Diagnosis not present

## 2018-04-25 DIAGNOSIS — Z7952 Long term (current) use of systemic steroids: Secondary | ICD-10-CM | POA: Diagnosis not present

## 2018-04-25 DIAGNOSIS — I447 Left bundle-branch block, unspecified: Secondary | ICD-10-CM | POA: Diagnosis not present

## 2018-04-25 DIAGNOSIS — D649 Anemia, unspecified: Secondary | ICD-10-CM | POA: Diagnosis not present

## 2018-04-25 DIAGNOSIS — K219 Gastro-esophageal reflux disease without esophagitis: Secondary | ICD-10-CM | POA: Diagnosis not present

## 2018-04-25 DIAGNOSIS — I5020 Unspecified systolic (congestive) heart failure: Secondary | ICD-10-CM | POA: Diagnosis not present

## 2018-04-25 DIAGNOSIS — E1122 Type 2 diabetes mellitus with diabetic chronic kidney disease: Secondary | ICD-10-CM | POA: Diagnosis not present

## 2018-04-28 DIAGNOSIS — I130 Hypertensive heart and chronic kidney disease with heart failure and stage 1 through stage 4 chronic kidney disease, or unspecified chronic kidney disease: Secondary | ICD-10-CM | POA: Diagnosis not present

## 2018-04-28 DIAGNOSIS — E1122 Type 2 diabetes mellitus with diabetic chronic kidney disease: Secondary | ICD-10-CM | POA: Diagnosis not present

## 2018-04-28 DIAGNOSIS — D649 Anemia, unspecified: Secondary | ICD-10-CM | POA: Diagnosis not present

## 2018-04-28 DIAGNOSIS — J45909 Unspecified asthma, uncomplicated: Secondary | ICD-10-CM | POA: Diagnosis not present

## 2018-04-28 DIAGNOSIS — Z471 Aftercare following joint replacement surgery: Secondary | ICD-10-CM | POA: Diagnosis not present

## 2018-04-28 DIAGNOSIS — I447 Left bundle-branch block, unspecified: Secondary | ICD-10-CM | POA: Diagnosis not present

## 2018-04-28 DIAGNOSIS — I252 Old myocardial infarction: Secondary | ICD-10-CM | POA: Diagnosis not present

## 2018-04-28 DIAGNOSIS — Z7952 Long term (current) use of systemic steroids: Secondary | ICD-10-CM | POA: Diagnosis not present

## 2018-04-28 DIAGNOSIS — K219 Gastro-esophageal reflux disease without esophagitis: Secondary | ICD-10-CM | POA: Diagnosis not present

## 2018-04-28 DIAGNOSIS — E039 Hypothyroidism, unspecified: Secondary | ICD-10-CM | POA: Diagnosis not present

## 2018-04-28 DIAGNOSIS — I2510 Atherosclerotic heart disease of native coronary artery without angina pectoris: Secondary | ICD-10-CM | POA: Diagnosis not present

## 2018-04-28 DIAGNOSIS — I5020 Unspecified systolic (congestive) heart failure: Secondary | ICD-10-CM | POA: Diagnosis not present

## 2018-04-28 DIAGNOSIS — N183 Chronic kidney disease, stage 3 (moderate): Secondary | ICD-10-CM | POA: Diagnosis not present

## 2018-04-28 DIAGNOSIS — K769 Liver disease, unspecified: Secondary | ICD-10-CM | POA: Diagnosis not present

## 2018-04-28 DIAGNOSIS — Z7982 Long term (current) use of aspirin: Secondary | ICD-10-CM | POA: Diagnosis not present

## 2018-04-28 DIAGNOSIS — Z794 Long term (current) use of insulin: Secondary | ICD-10-CM | POA: Diagnosis not present

## 2018-04-28 DIAGNOSIS — Z96652 Presence of left artificial knee joint: Secondary | ICD-10-CM | POA: Diagnosis not present

## 2018-04-28 DIAGNOSIS — M109 Gout, unspecified: Secondary | ICD-10-CM | POA: Diagnosis not present

## 2018-04-29 DIAGNOSIS — I2510 Atherosclerotic heart disease of native coronary artery without angina pectoris: Secondary | ICD-10-CM | POA: Diagnosis not present

## 2018-04-29 DIAGNOSIS — N183 Chronic kidney disease, stage 3 (moderate): Secondary | ICD-10-CM | POA: Diagnosis not present

## 2018-04-29 DIAGNOSIS — E1122 Type 2 diabetes mellitus with diabetic chronic kidney disease: Secondary | ICD-10-CM | POA: Diagnosis not present

## 2018-04-29 DIAGNOSIS — I447 Left bundle-branch block, unspecified: Secondary | ICD-10-CM | POA: Diagnosis not present

## 2018-04-29 DIAGNOSIS — M109 Gout, unspecified: Secondary | ICD-10-CM | POA: Diagnosis not present

## 2018-04-29 DIAGNOSIS — I252 Old myocardial infarction: Secondary | ICD-10-CM | POA: Diagnosis not present

## 2018-04-29 DIAGNOSIS — I130 Hypertensive heart and chronic kidney disease with heart failure and stage 1 through stage 4 chronic kidney disease, or unspecified chronic kidney disease: Secondary | ICD-10-CM | POA: Diagnosis not present

## 2018-04-29 DIAGNOSIS — E039 Hypothyroidism, unspecified: Secondary | ICD-10-CM | POA: Diagnosis not present

## 2018-04-29 DIAGNOSIS — I5020 Unspecified systolic (congestive) heart failure: Secondary | ICD-10-CM | POA: Diagnosis not present

## 2018-04-29 DIAGNOSIS — K769 Liver disease, unspecified: Secondary | ICD-10-CM | POA: Diagnosis not present

## 2018-04-29 DIAGNOSIS — D649 Anemia, unspecified: Secondary | ICD-10-CM | POA: Diagnosis not present

## 2018-04-29 DIAGNOSIS — Z7952 Long term (current) use of systemic steroids: Secondary | ICD-10-CM | POA: Diagnosis not present

## 2018-04-29 DIAGNOSIS — J45909 Unspecified asthma, uncomplicated: Secondary | ICD-10-CM | POA: Diagnosis not present

## 2018-04-29 DIAGNOSIS — Z471 Aftercare following joint replacement surgery: Secondary | ICD-10-CM | POA: Diagnosis not present

## 2018-04-29 DIAGNOSIS — Z794 Long term (current) use of insulin: Secondary | ICD-10-CM | POA: Diagnosis not present

## 2018-04-29 DIAGNOSIS — K219 Gastro-esophageal reflux disease without esophagitis: Secondary | ICD-10-CM | POA: Diagnosis not present

## 2018-04-29 DIAGNOSIS — Z7982 Long term (current) use of aspirin: Secondary | ICD-10-CM | POA: Diagnosis not present

## 2018-04-29 DIAGNOSIS — Z96652 Presence of left artificial knee joint: Secondary | ICD-10-CM | POA: Diagnosis not present

## 2018-04-30 DIAGNOSIS — I5020 Unspecified systolic (congestive) heart failure: Secondary | ICD-10-CM | POA: Diagnosis not present

## 2018-04-30 DIAGNOSIS — K769 Liver disease, unspecified: Secondary | ICD-10-CM | POA: Diagnosis not present

## 2018-04-30 DIAGNOSIS — Z96652 Presence of left artificial knee joint: Secondary | ICD-10-CM | POA: Diagnosis not present

## 2018-04-30 DIAGNOSIS — Z7952 Long term (current) use of systemic steroids: Secondary | ICD-10-CM | POA: Diagnosis not present

## 2018-04-30 DIAGNOSIS — I2510 Atherosclerotic heart disease of native coronary artery without angina pectoris: Secondary | ICD-10-CM | POA: Diagnosis not present

## 2018-04-30 DIAGNOSIS — E1122 Type 2 diabetes mellitus with diabetic chronic kidney disease: Secondary | ICD-10-CM | POA: Diagnosis not present

## 2018-04-30 DIAGNOSIS — J45909 Unspecified asthma, uncomplicated: Secondary | ICD-10-CM | POA: Diagnosis not present

## 2018-04-30 DIAGNOSIS — E039 Hypothyroidism, unspecified: Secondary | ICD-10-CM | POA: Diagnosis not present

## 2018-04-30 DIAGNOSIS — I252 Old myocardial infarction: Secondary | ICD-10-CM | POA: Diagnosis not present

## 2018-04-30 DIAGNOSIS — M109 Gout, unspecified: Secondary | ICD-10-CM | POA: Diagnosis not present

## 2018-04-30 DIAGNOSIS — I130 Hypertensive heart and chronic kidney disease with heart failure and stage 1 through stage 4 chronic kidney disease, or unspecified chronic kidney disease: Secondary | ICD-10-CM | POA: Diagnosis not present

## 2018-04-30 DIAGNOSIS — D649 Anemia, unspecified: Secondary | ICD-10-CM | POA: Diagnosis not present

## 2018-04-30 DIAGNOSIS — Z7982 Long term (current) use of aspirin: Secondary | ICD-10-CM | POA: Diagnosis not present

## 2018-04-30 DIAGNOSIS — K219 Gastro-esophageal reflux disease without esophagitis: Secondary | ICD-10-CM | POA: Diagnosis not present

## 2018-04-30 DIAGNOSIS — Z471 Aftercare following joint replacement surgery: Secondary | ICD-10-CM | POA: Diagnosis not present

## 2018-04-30 DIAGNOSIS — Z794 Long term (current) use of insulin: Secondary | ICD-10-CM | POA: Diagnosis not present

## 2018-04-30 DIAGNOSIS — N183 Chronic kidney disease, stage 3 (moderate): Secondary | ICD-10-CM | POA: Diagnosis not present

## 2018-04-30 DIAGNOSIS — I447 Left bundle-branch block, unspecified: Secondary | ICD-10-CM | POA: Diagnosis not present

## 2018-05-01 DIAGNOSIS — Z96652 Presence of left artificial knee joint: Secondary | ICD-10-CM | POA: Diagnosis not present

## 2018-05-05 DIAGNOSIS — R2689 Other abnormalities of gait and mobility: Secondary | ICD-10-CM | POA: Diagnosis not present

## 2018-05-05 DIAGNOSIS — Z96652 Presence of left artificial knee joint: Secondary | ICD-10-CM | POA: Diagnosis not present

## 2018-05-05 DIAGNOSIS — M25462 Effusion, left knee: Secondary | ICD-10-CM | POA: Diagnosis not present

## 2018-05-05 DIAGNOSIS — M62552 Muscle wasting and atrophy, not elsewhere classified, left thigh: Secondary | ICD-10-CM | POA: Diagnosis not present

## 2018-05-05 DIAGNOSIS — M25562 Pain in left knee: Secondary | ICD-10-CM | POA: Diagnosis not present

## 2018-05-07 DIAGNOSIS — M62552 Muscle wasting and atrophy, not elsewhere classified, left thigh: Secondary | ICD-10-CM | POA: Diagnosis not present

## 2018-05-07 DIAGNOSIS — M25562 Pain in left knee: Secondary | ICD-10-CM | POA: Diagnosis not present

## 2018-05-07 DIAGNOSIS — R2689 Other abnormalities of gait and mobility: Secondary | ICD-10-CM | POA: Diagnosis not present

## 2018-05-07 DIAGNOSIS — Z96652 Presence of left artificial knee joint: Secondary | ICD-10-CM | POA: Diagnosis not present

## 2018-05-07 DIAGNOSIS — M25462 Effusion, left knee: Secondary | ICD-10-CM | POA: Diagnosis not present

## 2018-05-09 DIAGNOSIS — M62552 Muscle wasting and atrophy, not elsewhere classified, left thigh: Secondary | ICD-10-CM | POA: Diagnosis not present

## 2018-05-09 DIAGNOSIS — Z96652 Presence of left artificial knee joint: Secondary | ICD-10-CM | POA: Diagnosis not present

## 2018-05-09 DIAGNOSIS — M25562 Pain in left knee: Secondary | ICD-10-CM | POA: Diagnosis not present

## 2018-05-09 DIAGNOSIS — R2689 Other abnormalities of gait and mobility: Secondary | ICD-10-CM | POA: Diagnosis not present

## 2018-05-09 DIAGNOSIS — M25462 Effusion, left knee: Secondary | ICD-10-CM | POA: Diagnosis not present

## 2018-05-12 DIAGNOSIS — R2689 Other abnormalities of gait and mobility: Secondary | ICD-10-CM | POA: Diagnosis not present

## 2018-05-12 DIAGNOSIS — M25562 Pain in left knee: Secondary | ICD-10-CM | POA: Diagnosis not present

## 2018-05-12 DIAGNOSIS — M25462 Effusion, left knee: Secondary | ICD-10-CM | POA: Diagnosis not present

## 2018-05-12 DIAGNOSIS — M62552 Muscle wasting and atrophy, not elsewhere classified, left thigh: Secondary | ICD-10-CM | POA: Diagnosis not present

## 2018-05-12 DIAGNOSIS — Z96652 Presence of left artificial knee joint: Secondary | ICD-10-CM | POA: Diagnosis not present

## 2018-05-14 DIAGNOSIS — Z96652 Presence of left artificial knee joint: Secondary | ICD-10-CM | POA: Diagnosis not present

## 2018-05-14 DIAGNOSIS — M25462 Effusion, left knee: Secondary | ICD-10-CM | POA: Diagnosis not present

## 2018-05-14 DIAGNOSIS — M25562 Pain in left knee: Secondary | ICD-10-CM | POA: Diagnosis not present

## 2018-05-14 DIAGNOSIS — R2689 Other abnormalities of gait and mobility: Secondary | ICD-10-CM | POA: Diagnosis not present

## 2018-05-14 DIAGNOSIS — M62552 Muscle wasting and atrophy, not elsewhere classified, left thigh: Secondary | ICD-10-CM | POA: Diagnosis not present

## 2018-05-16 DIAGNOSIS — R2689 Other abnormalities of gait and mobility: Secondary | ICD-10-CM | POA: Diagnosis not present

## 2018-05-16 DIAGNOSIS — M25562 Pain in left knee: Secondary | ICD-10-CM | POA: Diagnosis not present

## 2018-05-16 DIAGNOSIS — M25462 Effusion, left knee: Secondary | ICD-10-CM | POA: Diagnosis not present

## 2018-05-16 DIAGNOSIS — Z96652 Presence of left artificial knee joint: Secondary | ICD-10-CM | POA: Diagnosis not present

## 2018-05-16 DIAGNOSIS — M62552 Muscle wasting and atrophy, not elsewhere classified, left thigh: Secondary | ICD-10-CM | POA: Diagnosis not present

## 2018-05-19 DIAGNOSIS — M25462 Effusion, left knee: Secondary | ICD-10-CM | POA: Diagnosis not present

## 2018-05-19 DIAGNOSIS — M62552 Muscle wasting and atrophy, not elsewhere classified, left thigh: Secondary | ICD-10-CM | POA: Diagnosis not present

## 2018-05-19 DIAGNOSIS — Z96652 Presence of left artificial knee joint: Secondary | ICD-10-CM | POA: Diagnosis not present

## 2018-05-19 DIAGNOSIS — R2689 Other abnormalities of gait and mobility: Secondary | ICD-10-CM | POA: Diagnosis not present

## 2018-05-19 DIAGNOSIS — M25562 Pain in left knee: Secondary | ICD-10-CM | POA: Diagnosis not present

## 2018-05-28 DIAGNOSIS — Z96652 Presence of left artificial knee joint: Secondary | ICD-10-CM | POA: Diagnosis not present

## 2018-05-28 DIAGNOSIS — M25562 Pain in left knee: Secondary | ICD-10-CM | POA: Diagnosis not present

## 2018-05-28 DIAGNOSIS — M62552 Muscle wasting and atrophy, not elsewhere classified, left thigh: Secondary | ICD-10-CM | POA: Diagnosis not present

## 2018-05-28 DIAGNOSIS — M25462 Effusion, left knee: Secondary | ICD-10-CM | POA: Diagnosis not present

## 2018-05-28 DIAGNOSIS — R2689 Other abnormalities of gait and mobility: Secondary | ICD-10-CM | POA: Diagnosis not present

## 2018-06-09 DIAGNOSIS — H4312 Vitreous hemorrhage, left eye: Secondary | ICD-10-CM | POA: Diagnosis not present

## 2018-06-09 DIAGNOSIS — E113592 Type 2 diabetes mellitus with proliferative diabetic retinopathy without macular edema, left eye: Secondary | ICD-10-CM | POA: Diagnosis not present

## 2018-06-09 DIAGNOSIS — E113491 Type 2 diabetes mellitus with severe nonproliferative diabetic retinopathy without macular edema, right eye: Secondary | ICD-10-CM | POA: Diagnosis not present

## 2018-06-09 DIAGNOSIS — H3582 Retinal ischemia: Secondary | ICD-10-CM | POA: Diagnosis not present

## 2018-06-30 DIAGNOSIS — E119 Type 2 diabetes mellitus without complications: Secondary | ICD-10-CM | POA: Diagnosis not present

## 2018-07-03 DIAGNOSIS — E039 Hypothyroidism, unspecified: Secondary | ICD-10-CM | POA: Diagnosis not present

## 2018-07-03 DIAGNOSIS — M109 Gout, unspecified: Secondary | ICD-10-CM | POA: Diagnosis not present

## 2018-07-03 DIAGNOSIS — E1165 Type 2 diabetes mellitus with hyperglycemia: Secondary | ICD-10-CM | POA: Diagnosis not present

## 2018-07-24 DIAGNOSIS — E039 Hypothyroidism, unspecified: Secondary | ICD-10-CM | POA: Diagnosis not present

## 2018-07-24 DIAGNOSIS — M109 Gout, unspecified: Secondary | ICD-10-CM | POA: Diagnosis not present

## 2018-07-24 DIAGNOSIS — E1165 Type 2 diabetes mellitus with hyperglycemia: Secondary | ICD-10-CM | POA: Diagnosis not present

## 2018-07-28 DIAGNOSIS — H4312 Vitreous hemorrhage, left eye: Secondary | ICD-10-CM | POA: Diagnosis not present

## 2018-07-28 DIAGNOSIS — E113491 Type 2 diabetes mellitus with severe nonproliferative diabetic retinopathy without macular edema, right eye: Secondary | ICD-10-CM | POA: Diagnosis not present

## 2018-07-28 DIAGNOSIS — E113592 Type 2 diabetes mellitus with proliferative diabetic retinopathy without macular edema, left eye: Secondary | ICD-10-CM | POA: Diagnosis not present

## 2018-07-28 DIAGNOSIS — H3582 Retinal ischemia: Secondary | ICD-10-CM | POA: Diagnosis not present

## 2018-07-29 DIAGNOSIS — Z96652 Presence of left artificial knee joint: Secondary | ICD-10-CM | POA: Diagnosis not present

## 2018-08-12 DIAGNOSIS — E113393 Type 2 diabetes mellitus with moderate nonproliferative diabetic retinopathy without macular edema, bilateral: Secondary | ICD-10-CM | POA: Diagnosis not present

## 2018-08-12 DIAGNOSIS — H4312 Vitreous hemorrhage, left eye: Secondary | ICD-10-CM | POA: Diagnosis not present

## 2018-08-13 DIAGNOSIS — E113491 Type 2 diabetes mellitus with severe nonproliferative diabetic retinopathy without macular edema, right eye: Secondary | ICD-10-CM | POA: Diagnosis not present

## 2018-08-13 DIAGNOSIS — H3582 Retinal ischemia: Secondary | ICD-10-CM | POA: Diagnosis not present

## 2018-08-13 DIAGNOSIS — E113592 Type 2 diabetes mellitus with proliferative diabetic retinopathy without macular edema, left eye: Secondary | ICD-10-CM | POA: Diagnosis not present

## 2018-08-13 DIAGNOSIS — H4312 Vitreous hemorrhage, left eye: Secondary | ICD-10-CM | POA: Diagnosis not present

## 2018-08-21 DIAGNOSIS — E113592 Type 2 diabetes mellitus with proliferative diabetic retinopathy without macular edema, left eye: Secondary | ICD-10-CM | POA: Diagnosis not present

## 2018-08-22 DIAGNOSIS — E119 Type 2 diabetes mellitus without complications: Secondary | ICD-10-CM | POA: Diagnosis not present

## 2018-08-22 DIAGNOSIS — E039 Hypothyroidism, unspecified: Secondary | ICD-10-CM | POA: Diagnosis not present

## 2018-09-23 DIAGNOSIS — E119 Type 2 diabetes mellitus without complications: Secondary | ICD-10-CM | POA: Diagnosis not present

## 2018-09-23 DIAGNOSIS — E785 Hyperlipidemia, unspecified: Secondary | ICD-10-CM | POA: Diagnosis not present

## 2018-09-29 DIAGNOSIS — E113592 Type 2 diabetes mellitus with proliferative diabetic retinopathy without macular edema, left eye: Secondary | ICD-10-CM | POA: Diagnosis not present

## 2018-10-16 DIAGNOSIS — E039 Hypothyroidism, unspecified: Secondary | ICD-10-CM | POA: Diagnosis not present

## 2018-10-16 DIAGNOSIS — E1165 Type 2 diabetes mellitus with hyperglycemia: Secondary | ICD-10-CM | POA: Diagnosis not present

## 2018-10-16 DIAGNOSIS — E785 Hyperlipidemia, unspecified: Secondary | ICD-10-CM | POA: Diagnosis not present

## 2018-10-16 DIAGNOSIS — Z79899 Other long term (current) drug therapy: Secondary | ICD-10-CM | POA: Diagnosis not present

## 2018-10-20 DIAGNOSIS — M109 Gout, unspecified: Secondary | ICD-10-CM | POA: Diagnosis not present

## 2018-10-20 DIAGNOSIS — E1165 Type 2 diabetes mellitus with hyperglycemia: Secondary | ICD-10-CM | POA: Diagnosis not present

## 2018-10-20 DIAGNOSIS — Z0000 Encounter for general adult medical examination without abnormal findings: Secondary | ICD-10-CM | POA: Diagnosis not present

## 2018-10-20 DIAGNOSIS — I2510 Atherosclerotic heart disease of native coronary artery without angina pectoris: Secondary | ICD-10-CM | POA: Diagnosis not present

## 2018-11-10 DIAGNOSIS — H4312 Vitreous hemorrhage, left eye: Secondary | ICD-10-CM | POA: Diagnosis not present

## 2018-11-10 DIAGNOSIS — E113592 Type 2 diabetes mellitus with proliferative diabetic retinopathy without macular edema, left eye: Secondary | ICD-10-CM | POA: Diagnosis not present

## 2018-11-10 DIAGNOSIS — E113411 Type 2 diabetes mellitus with severe nonproliferative diabetic retinopathy with macular edema, right eye: Secondary | ICD-10-CM | POA: Diagnosis not present

## 2019-01-05 DIAGNOSIS — E113592 Type 2 diabetes mellitus with proliferative diabetic retinopathy without macular edema, left eye: Secondary | ICD-10-CM | POA: Diagnosis not present

## 2019-01-05 DIAGNOSIS — E113593 Type 2 diabetes mellitus with proliferative diabetic retinopathy without macular edema, bilateral: Secondary | ICD-10-CM | POA: Diagnosis not present

## 2019-01-05 DIAGNOSIS — H3582 Retinal ischemia: Secondary | ICD-10-CM | POA: Diagnosis not present

## 2019-01-05 DIAGNOSIS — H4313 Vitreous hemorrhage, bilateral: Secondary | ICD-10-CM | POA: Diagnosis not present

## 2019-01-08 DIAGNOSIS — E1140 Type 2 diabetes mellitus with diabetic neuropathy, unspecified: Secondary | ICD-10-CM | POA: Diagnosis not present

## 2019-01-08 DIAGNOSIS — L97512 Non-pressure chronic ulcer of other part of right foot with fat layer exposed: Secondary | ICD-10-CM | POA: Diagnosis not present

## 2019-01-08 DIAGNOSIS — E11621 Type 2 diabetes mellitus with foot ulcer: Secondary | ICD-10-CM | POA: Diagnosis not present

## 2019-01-08 DIAGNOSIS — L03031 Cellulitis of right toe: Secondary | ICD-10-CM | POA: Diagnosis not present

## 2019-01-08 DIAGNOSIS — M2041 Other hammer toe(s) (acquired), right foot: Secondary | ICD-10-CM | POA: Diagnosis not present

## 2019-01-13 DIAGNOSIS — L97512 Non-pressure chronic ulcer of other part of right foot with fat layer exposed: Secondary | ICD-10-CM | POA: Diagnosis not present

## 2019-01-13 DIAGNOSIS — L03031 Cellulitis of right toe: Secondary | ICD-10-CM | POA: Diagnosis not present

## 2019-01-13 DIAGNOSIS — M2041 Other hammer toe(s) (acquired), right foot: Secondary | ICD-10-CM | POA: Diagnosis not present

## 2019-01-13 DIAGNOSIS — E1165 Type 2 diabetes mellitus with hyperglycemia: Secondary | ICD-10-CM | POA: Diagnosis not present

## 2019-01-13 DIAGNOSIS — E1140 Type 2 diabetes mellitus with diabetic neuropathy, unspecified: Secondary | ICD-10-CM | POA: Diagnosis not present

## 2019-01-22 DIAGNOSIS — E039 Hypothyroidism, unspecified: Secondary | ICD-10-CM | POA: Diagnosis not present

## 2019-01-22 DIAGNOSIS — E0865 Diabetes mellitus due to underlying condition with hyperglycemia: Secondary | ICD-10-CM | POA: Diagnosis not present

## 2019-01-22 DIAGNOSIS — M109 Gout, unspecified: Secondary | ICD-10-CM | POA: Diagnosis not present

## 2019-01-22 DIAGNOSIS — E0840 Diabetes mellitus due to underlying condition with diabetic neuropathy, unspecified: Secondary | ICD-10-CM | POA: Diagnosis not present

## 2019-01-22 DIAGNOSIS — M2041 Other hammer toe(s) (acquired), right foot: Secondary | ICD-10-CM | POA: Diagnosis not present

## 2019-01-22 DIAGNOSIS — Z79899 Other long term (current) drug therapy: Secondary | ICD-10-CM | POA: Diagnosis not present

## 2019-01-22 DIAGNOSIS — E1165 Type 2 diabetes mellitus with hyperglycemia: Secondary | ICD-10-CM | POA: Diagnosis not present

## 2019-01-22 DIAGNOSIS — L97512 Non-pressure chronic ulcer of other part of right foot with fat layer exposed: Secondary | ICD-10-CM | POA: Diagnosis not present

## 2019-01-22 DIAGNOSIS — L03031 Cellulitis of right toe: Secondary | ICD-10-CM | POA: Diagnosis not present

## 2019-01-26 DIAGNOSIS — H4311 Vitreous hemorrhage, right eye: Secondary | ICD-10-CM | POA: Diagnosis not present

## 2019-01-26 DIAGNOSIS — E113591 Type 2 diabetes mellitus with proliferative diabetic retinopathy without macular edema, right eye: Secondary | ICD-10-CM | POA: Diagnosis not present

## 2019-01-27 DIAGNOSIS — L97512 Non-pressure chronic ulcer of other part of right foot with fat layer exposed: Secondary | ICD-10-CM | POA: Diagnosis not present

## 2019-01-27 DIAGNOSIS — L03031 Cellulitis of right toe: Secondary | ICD-10-CM | POA: Diagnosis not present

## 2019-01-27 DIAGNOSIS — E1165 Type 2 diabetes mellitus with hyperglycemia: Secondary | ICD-10-CM | POA: Diagnosis not present

## 2019-01-27 DIAGNOSIS — E1140 Type 2 diabetes mellitus with diabetic neuropathy, unspecified: Secondary | ICD-10-CM | POA: Diagnosis not present

## 2019-01-27 DIAGNOSIS — M2041 Other hammer toe(s) (acquired), right foot: Secondary | ICD-10-CM | POA: Diagnosis not present

## 2019-03-16 DIAGNOSIS — E113512 Type 2 diabetes mellitus with proliferative diabetic retinopathy with macular edema, left eye: Secondary | ICD-10-CM | POA: Diagnosis not present

## 2019-03-16 DIAGNOSIS — E113591 Type 2 diabetes mellitus with proliferative diabetic retinopathy without macular edema, right eye: Secondary | ICD-10-CM | POA: Diagnosis not present

## 2019-03-16 DIAGNOSIS — H3582 Retinal ischemia: Secondary | ICD-10-CM | POA: Diagnosis not present

## 2019-03-16 DIAGNOSIS — H4313 Vitreous hemorrhage, bilateral: Secondary | ICD-10-CM | POA: Diagnosis not present

## 2019-04-06 DIAGNOSIS — D509 Iron deficiency anemia, unspecified: Secondary | ICD-10-CM | POA: Diagnosis not present

## 2019-04-06 DIAGNOSIS — Z794 Long term (current) use of insulin: Secondary | ICD-10-CM | POA: Diagnosis not present

## 2019-04-06 DIAGNOSIS — R109 Unspecified abdominal pain: Secondary | ICD-10-CM | POA: Diagnosis not present

## 2019-04-06 DIAGNOSIS — A0472 Enterocolitis due to Clostridium difficile, not specified as recurrent: Secondary | ICD-10-CM | POA: Diagnosis not present

## 2019-04-06 DIAGNOSIS — E039 Hypothyroidism, unspecified: Secondary | ICD-10-CM | POA: Diagnosis not present

## 2019-04-06 DIAGNOSIS — B9620 Unspecified Escherichia coli [E. coli] as the cause of diseases classified elsewhere: Secondary | ICD-10-CM | POA: Diagnosis not present

## 2019-04-06 DIAGNOSIS — N390 Urinary tract infection, site not specified: Secondary | ICD-10-CM | POA: Diagnosis not present

## 2019-04-06 DIAGNOSIS — N3000 Acute cystitis without hematuria: Secondary | ICD-10-CM | POA: Diagnosis not present

## 2019-04-06 DIAGNOSIS — D72829 Elevated white blood cell count, unspecified: Secondary | ICD-10-CM | POA: Diagnosis not present

## 2019-04-06 DIAGNOSIS — E119 Type 2 diabetes mellitus without complications: Secondary | ICD-10-CM | POA: Diagnosis not present

## 2019-04-06 DIAGNOSIS — Z79899 Other long term (current) drug therapy: Secondary | ICD-10-CM | POA: Diagnosis not present

## 2019-04-06 DIAGNOSIS — K529 Noninfective gastroenteritis and colitis, unspecified: Secondary | ICD-10-CM | POA: Diagnosis not present

## 2019-04-06 DIAGNOSIS — N179 Acute kidney failure, unspecified: Secondary | ICD-10-CM | POA: Diagnosis not present

## 2019-04-06 DIAGNOSIS — K449 Diaphragmatic hernia without obstruction or gangrene: Secondary | ICD-10-CM | POA: Diagnosis not present

## 2019-04-17 DIAGNOSIS — Z09 Encounter for follow-up examination after completed treatment for conditions other than malignant neoplasm: Secondary | ICD-10-CM | POA: Diagnosis not present

## 2019-04-17 DIAGNOSIS — E039 Hypothyroidism, unspecified: Secondary | ICD-10-CM | POA: Diagnosis not present

## 2019-04-17 DIAGNOSIS — E119 Type 2 diabetes mellitus without complications: Secondary | ICD-10-CM | POA: Diagnosis not present

## 2019-04-17 DIAGNOSIS — A0472 Enterocolitis due to Clostridium difficile, not specified as recurrent: Secondary | ICD-10-CM | POA: Diagnosis not present

## 2019-04-17 DIAGNOSIS — I2510 Atherosclerotic heart disease of native coronary artery without angina pectoris: Secondary | ICD-10-CM | POA: Diagnosis not present

## 2019-04-25 DIAGNOSIS — K219 Gastro-esophageal reflux disease without esophagitis: Secondary | ICD-10-CM | POA: Diagnosis not present

## 2019-04-25 DIAGNOSIS — M109 Gout, unspecified: Secondary | ICD-10-CM | POA: Diagnosis not present

## 2019-04-25 DIAGNOSIS — E039 Hypothyroidism, unspecified: Secondary | ICD-10-CM | POA: Diagnosis not present

## 2019-05-03 NOTE — Progress Notes (Signed)
Cardiology Office Note:    Date:  05/04/2019   ID:  [name], DOB May 04, 1936, MRN [id number]  PCP:  Ronita Hipps, MD  Cardiologist:  Sinclair Grooms, MD   Referring MD: Lillard Anes,*   Chief Complaint  Patient presents with  . Coronary Artery Disease  . Congestive Heart Failure    History of Present Illness:    Carol [name] is a 83 y.o. female with a hx of DM, HTN, asthma, hyperthyroidism, HLD,, CKD stage III, left bundle branch block, coronary artery disease with CABG October 2016 with LIMA to LAD, sequential SVG to OM 2 and 3, and SVG to RCA, chronic diastolic systolic heart failure with LVEF improved from 25% to 50%, comes in today as a new patient for longitudinal cardiac management.  Carol [name] is doing well.  She has moved to [facility] and now lives near her daughter.  She recently became very ill, developing C. difficile colitis after antibiotics were used to treat a foot infection.  She states that it was touching go for a while but eventually she got over it.  There were no cardiac complications during the serious illness.  She did have knee replacement surgery involving the left knee.  She states it did not go well.  Past Medical History:  Diagnosis Date  . Anemia    a. Noted on labs 02/2014 possibly procedurally related.  . Asthma   . CAD S/P percutaneous coronary angioplasty 01/22/2014   A. (12/2013): POBA - OM2 99% (very tortuous segment) - reduced ~ 50% & TIMI 3 flow, 80-90% stenosis in mid PDA, Irregularities <50% in mRCA, pLAD and prox LCx; b. 02/17/14: Moderate sized fixed defect in Lateral wall --> cath with OM2 restenosis & otherwise stable --> Xience Alpine DES x 2 (2.25 mm x 12 & 8 mm). c. NSTEMI 02/2014 s/p PTCA/balloon angioplasty only to small caliber PDA.  . Carotid stenosis    Carotid US 11/17: L 1-39; FU prn  . Chronic systolic heart failure ([place])    a. ICM b. ECHO (01/2014): EF 25-30%, grade I DD, trivial MR. c. EF by  Myoview 02/17/14 = ~53%. d. EF by cath 02/24/14 - improved to 50-55%.  . CKD (chronic kidney disease), stage III   . Contrast media allergy   . Environmental allergies   . GERD (gastroesophageal reflux disease)   . Gout, unspecified   . HOH (hard of hearing)   . Hyperlipidemia    a. H/o intolerance to Zocor, Lipitor. Had muscle aches on higher dose Crestor.  . Hypertension   . Hypothyroidism   . LBBB (left bundle branch block)    a. Transient during 02/2014 admission.  . NSTEMI (non-ST elevated myocardial infarction) ([place]) 2014; 12/2013, 01/2014  . QT prolongation    a. Noted on EKG 02/2014.  Marland Kitchen Sinus bradycardia    a. HR 40s-50s during 02/2014 admission, limiting BB dose.  Marland Kitchen TIA (transient ischemic attack) 1990's   a. TIA vs stroke 1990's "mild" - sounds like a TIA as she was told she had stroke symptoms but negative scans. Denies residual effects.  . Type II diabetes mellitus ([place])     Past Surgical History:  Procedure Laterality Date  . ABDOMINAL HYSTERECTOMY     complete  . APPENDECTOMY  1959  . CARDIAC CATHETERIZATION  02/24/2014   Procedure: CORONARY BALLOON ANGIOPLASTY;  Surgeon: Burnell Blanks, MD;  Location: [facility] CATH LAB;  Service: Cardiovascular;;  . CARDIAC CATHETERIZATION N/A  04/20/2015   Procedure: Left Heart Cath and Coronary Angiography;  Surgeon: Belva Crome, MD;  Location: [facility] CV LAB;  Service: Cardiovascular;  Laterality: N/A;  . CATARACT EXTRACTION W/ INTRAOCULAR LENS  IMPLANT, BILATERAL Bilateral 2013  . [place]; 1960  . CHOLECYSTECTOMY  1989  . CORONARY ANGIOPLASTY  12/2013   POBA - OM2  . CORONARY ARTERY BYPASS GRAFT N/A 04/25/2015   Procedure: CORONARY ARTERY BYPASS GRAFTING (CABG) x four,  using left internal mammary artery and right leg greater saphenous vein harvested endoscopically;  Surgeon: Gaye Pollack, MD;  Location: [facility] OR;  Service: Open Heart Surgery;  Laterality: N/A;  . JOINT REPLACEMENT  10 years ago    right knee  .  LEFT HEART CATHETERIZATION WITH CORONARY ANGIOGRAM N/A 01/22/2014   Procedure: LEFT HEART CATHETERIZATION WITH CORONARY ANGIOGRAM;  Surgeon: Sinclair Grooms, MD;  Location: [facility] CATH LAB;  Service: Cardiovascular;  Laterality: N/A;  . LEFT HEART CATHETERIZATION WITH CORONARY ANGIOGRAM N/A 02/17/2014   Procedure: LEFT HEART CATHETERIZATION WITH CORONARY ANGIOGRAM;  Surgeon: Burnell Blanks, MD;  Location: [facility] CATH LAB;  Service: Cardiovascular;  Laterality: N/A;  . LEFT HEART CATHETERIZATION WITH CORONARY ANGIOGRAM N/A 02/24/2014   Procedure: LEFT HEART CATHETERIZATION WITH CORONARY ANGIOGRAM;  Surgeon: Burnell Blanks, MD;  Location: [facility] CATH LAB;  Service: Cardiovascular;  Laterality: N/A;  . LUMBAR LAMINECTOMY/DECOMPRESSION MICRODISCECTOMY Left 12/22/2014   Procedure: CENTRAL DECOMPRESSIVE LUMBAR, AND LAMINECTOMY L5-S1,  S1-S2 FOR SPINAL STENOSIS FORAMINOTOMY L5, S1, S2 ROOTS ON LEFT FOR FORAMINAL STENOSIS, AND DECOMPRESSION L5-S1, S1-S2 ACCORDING TO LABELING OF [place] X-RAYS;  Surgeon: Latanya Maudlin, MD;  Location: [facility] ORS;  Service: Orthopedics;  Laterality: Left;  . PERCUTANEOUS CORONARY STENT INTERVENTION (PCI-S)  02/17/14   For restenosis of OM2 - PCI with Xience Apline DES 2.25 mm x 12 mm & 2.25 mm x 8 mm overlapping  . REDUCTION MAMMAPLASTY Bilateral 1990's  . SHOULDER OPEN ROTATOR CUFF REPAIR Bilateral 1980's - 1990's  . TEE WITHOUT CARDIOVERSION N/A 04/25/2015   Procedure: TRANSESOPHAGEAL ECHOCARDIOGRAM (TEE);  Surgeon: Gaye Pollack, MD;  Location: [facility];  Service: Open Heart Surgery;  Laterality: N/A;  . TOTAL KNEE ARTHROPLASTY Right 2009  . TOTAL KNEE ARTHROPLASTY Left 04/21/2018   Procedure: LEFT TOTAL KNEE ARTHROPLASTY;  Surgeon: Vickey Huger, MD;  Location: [facility] ORS;  Service: Orthopedics;  Laterality: Left;  Adductor Block    Current Medications: Current Meds  Medication Sig  . allopurinol (ZYLOPRIM) 300 MG tablet Take 300 mg by mouth every morning.   .  Biotin 5000 MCG CAPS Take 5,000 mcg by mouth every morning.   . carvedilol (COREG) 6.25 MG tablet Take 6.25 mg by mouth 2 (two) times daily with a meal.  . clopidogrel (PLAVIX) 75 MG tablet Take 1 tablet (75 mg total) by mouth daily.  . colchicine 0.6 MG tablet Take 0.6 mg by mouth daily as needed (gout flare up.).   Marland Kitchen empagliflozin (JARDIANCE) 25 MG TABS tablet Take 25 mg by mouth daily.  Marland Kitchen ezetimibe (ZETIA) 10 MG tablet Take 10 mg by mouth daily.  . famotidine (PEPCID) 10 MG tablet Take 10 mg by mouth 2 (two) times daily as needed for heartburn or indigestion.   . furosemide (LASIX) 80 MG tablet Take 80 mg by mouth daily.  Marland Kitchen gabapentin (NEURONTIN) 300 MG capsule Take 300 mg by mouth 2 (two) times daily.  . insulin NPH-regular Human (NOVOLIN 70/30) (70-30) 100 UNIT/ML injection Inject 25-32 Units  into the skin daily.  . Lactobacillus Acid-Pectin (ACIDOPHILUS/PECTIN) CAPS Take 1 tablet by mouth daily.  Marland Kitchen levothyroxine (SYNTHROID, LEVOTHROID) 50 MCG tablet Take 50 mcg by mouth every morning.   . methocarbamol (ROBAXIN) 500 MG tablet Take 1-2 tablets (500-1,000 mg total) by mouth every 6 (six) hours as needed for muscle spasms.  . nitroGLYCERIN (NITROSTAT) 0.4 MG SL tablet Place 0.4 mg under the tongue as needed.  . ONE TOUCH ULTRA TEST test strip 1 each by Other route as needed for other.   . oxyCODONE (OXY IR/ROXICODONE) 5 MG immediate release tablet Take 1-2 tablets (5-10 mg total) by mouth every 6 (six) hours as needed for moderate pain (pain score 4-6).  . pantoprazole (PROTONIX) 40 MG tablet TAKE 1 TABLET BY MOUTH ONCE DAILY (Patient taking differently: Take 40 mg by mouth daily. )  . traMADol (ULTRAM) 50 MG tablet Take 50 mg by mouth as needed.  . [DISCONTINUED] aspirin EC 325 MG EC tablet Take 1 tablet (325 mg total) by mouth daily.     Allergies:   Contrast media [iodinated diagnostic agents], Cortizone-10 [hydrocortisone], Lipitor [atorvastatin], and Zocor [simvastatin]   Social  History   Socioeconomic History  . Marital status: Widowed    Spouse name: Not on file  . Number of children: 2  . Years of education: Not on file  . Highest education level: Not on file  Occupational History  . Occupation: Retired  Scientific laboratory technician  . Financial resource strain: Not on file  . Food insecurity    Worry: Not on file    Inability: Not on file  . Transportation needs    Medical: Not on file    Non-medical: Not on file  Tobacco Use  . Smoking status: Never Smoker  . Smokeless tobacco: Never Used  Substance and Sexual Activity  . Alcohol use: No  . Drug use: No  . Sexual activity: Never  Lifestyle  . Physical activity    Days per week: Not on file    Minutes per session: Not on file  . Stress: Not on file  Relationships  . Social Herbalist on phone: Not on file    Gets together: Not on file    Attends religious service: Not on file    Active member of club or organization: Not on file    Attends meetings of clubs or organizations: Not on file    Relationship status: Not on file  Other Topics Concern  . Not on file  Social History Narrative   Lives in [place] by herself.      Family History: The patient's family history includes Cancer in her sister; Diabetes in her brother and mother; Emphysema in her father; Heart attack (age of onset: 29) in her mother; Heart disease in her brother and father; Hypertension in her mother. There is no history of Stroke.  ROS:   Please see the history of present illness.    Under a lot of stress that has to do with family matters.  As noted above [place] is relocated [place] [facility].  Her knee is still stiff.  She did not complete rehab.  All other systems reviewed and are negative.  EKGs/Labs/Other Studies Reviewed:    The following studies were reviewed today: No new data   EKG:  EKG normal sinus rhythm, low voltage, nonspecific T wave flattening, PACs, and when compared to prior  tracing from September 2019, no changes noted.  Recent Labs: No results found for requested labs within last 8760 hours.  Recent Lipid Panel    Component Value Date/Time   CHOL 176 07/26/2014 1115   TRIG 195.0 (H) 07/26/2014 1115   HDL 42.90 07/26/2014 1115   CHOLHDL 4 07/26/2014 1115   VLDL 39.0 07/26/2014 1115   LDLCALC 94 07/26/2014 1115    Physical Exam:    VS:  BP 118/64   Pulse 62   Ht 5' 7.5" (1.715 m)   Wt 196 lb 12.8 oz (89.3 kg)   SpO2 98%   BMI 30.37 kg/m     Wt Readings from Last 3 Encounters:  05/04/19 196 lb 12.8 oz (89.3 kg)  04/21/18 221 lb (100.2 kg)  04/15/18 221 lb (100.2 kg)     GEN: Moderate obesity. No acute distress HEENT: Normal NECK: No JVD. LYMPHATICS: No lymphadenopathy CARDIAC:  RRR without murmur, gallop, or edema. VASCULAR:  Normal Pulses. No bruits. RESPIRATORY:  Clear to auscultation without rales, wheezing or rhonchi  ABDOMEN: Soft, non-tender, non-distended, No pulsatile mass, MUSCULOSKELETAL: No deformity  SKIN: Warm and dry NEUROLOGIC:  Alert and oriented x 3 PSYCHIATRIC:  Normal affect   ASSESSMENT:    1. Chronic combined systolic and diastolic heart failure ([place])   2. Coronary artery disease involving coronary bypass graft of native heart with angina pectoris ([place])   3. Hyperlipidemia with target LDL less than 70   4. Essential hypertension, benign   5. CKD stage 3 due to type 2 diabetes mellitus ([place])   6. Educated about COVID-19 virus infection    PLAN:    In order of problems listed above:  1. Secondary prevention discussed.  She needs to establish with a primary cardiologist in [facility] , [place]. 2. No evidence of volume overload/CHF. 3. LDL target should be less than 70.  Most recent was 50 in July. 4. Target blood pressure 130/80 mmHg. 5. Most recent creatinine from July 30 was essentially normal 6. The 3W's are being observed and endorsed by the patient.   Overall education and awareness concerning  primary/secondary risk prevention was discussed in detail: LDL less than 70, hemoglobin A1c less than 7, blood pressure target less than 130/80 mmHg, >150 minutes of moderate aerobic activity per week, avoidance of smoking, weight control (via diet and exercise), and continued surveillance/management of/for obstructive sleep apnea.    Medication Adjustments/Labs and Tests Ordered: Current medicines are reviewed at length with the patient today.  Concerns regarding medicines are outlined above.  Orders Placed This Encounter  Procedures  . EKG 12-Lead   Meds ordered this encounter  Medications  . aspirin EC 81 MG tablet    Sig: Take 1 tablet (81 mg total) by mouth daily.    Dispense:  90 tablet    Refill:  3    Patient Instructions  Medication Instructions:  1) DECREASE Aspirin to 81mg  once daily  *If you need a refill on your cardiac medications before your next appointment, please call your pharmacy*  Lab Work: None If you have labs (blood work) drawn today and your tests are completely normal, you will receive your results only by: Marland Kitchen MyChart Message (if you have MyChart) OR . A paper copy in the mail If you have any lab test that is abnormal or we need to change your treatment, we will call you to review the results.  Testing/Procedures: None  Follow-Up: At [facility], you and your health needs are our priority.  As part of our continuing  mission to provide you with exceptional heart care, we have created designated Provider Care Teams.  These Care Teams include your primary Cardiologist (physician) and Advanced Practice Providers (APPs -  Physician Assistants and Nurse Practitioners) who all work together to provide you with the care you need, when you need it.  Your next appointment:   12 months  The format for your next appointment:   In Person  Provider:   You may see Sinclair Grooms, MD or one of the following Advanced Practice Providers on your designated Care  Team:    Truitt Merle, NP  Cecilie Kicks, NP  Kathyrn Drown, NP   Other Instructions      Signed, Sinclair Grooms, MD  05/04/2019 10:52 AM    [place]

## 2019-05-04 ENCOUNTER — Encounter: Payer: Self-pay | Admitting: Interventional Cardiology

## 2019-05-04 ENCOUNTER — Other Ambulatory Visit: Payer: Self-pay

## 2019-05-04 ENCOUNTER — Ambulatory Visit: Payer: Medicare Other | Admitting: Interventional Cardiology

## 2019-05-04 VITALS — BP 118/64 | HR 62 | Ht 67.5 in | Wt 196.8 lb

## 2019-05-04 DIAGNOSIS — E785 Hyperlipidemia, unspecified: Secondary | ICD-10-CM

## 2019-05-04 DIAGNOSIS — Z7189 Other specified counseling: Secondary | ICD-10-CM

## 2019-05-04 DIAGNOSIS — I10 Essential (primary) hypertension: Secondary | ICD-10-CM

## 2019-05-04 DIAGNOSIS — I5042 Chronic combined systolic (congestive) and diastolic (congestive) heart failure: Secondary | ICD-10-CM | POA: Diagnosis not present

## 2019-05-04 DIAGNOSIS — E1122 Type 2 diabetes mellitus with diabetic chronic kidney disease: Secondary | ICD-10-CM

## 2019-05-04 DIAGNOSIS — I25709 Atherosclerosis of coronary artery bypass graft(s), unspecified, with unspecified angina pectoris: Secondary | ICD-10-CM | POA: Diagnosis not present

## 2019-05-04 DIAGNOSIS — N183 Chronic kidney disease, stage 3 unspecified: Secondary | ICD-10-CM

## 2019-05-04 MED ORDER — ASPIRIN EC 81 MG PO TBEC: 81.0000 mg | DELAYED_RELEASE_TABLET | Freq: Every day | ORAL | 3 refills | Status: DC

## 2019-05-04 NOTE — Patient Instructions (Signed)
Medication Instructions:  1) DECREASE Aspirin to 81mg  once daily  *If you need a refill on your cardiac medications before your next appointment, please call your pharmacy*  Lab Work: None If you have labs (blood work) drawn today and your tests are completely normal, you will receive your results only by: Marland Kitchen MyChart Message (if you have MyChart) OR . A paper copy in the mail If you have any lab test that is abnormal or we need to change your treatment, we will call you to review the results.  Testing/Procedures: None  Follow-Up: At [facility], you and your health needs are our priority.  As part of our continuing mission to provide you with exceptional heart care, we have created designated Provider Care Teams.  These Care Teams include your primary Cardiologist (physician) and Advanced Practice Providers (APPs -  Physician Assistants and Nurse Practitioners) who all work together to provide you with the care you need, when you need it.  Your next appointment:   12 months  The format for your next appointment:   In Person  Provider:   You may see Sinclair Grooms, MD or one of the following Advanced Practice Providers on your designated Care Team:    Truitt Merle, NP  Cecilie Kicks, NP  Kathyrn Drown, NP   Other Instructions

## 2019-05-14 DIAGNOSIS — A0472 Enterocolitis due to Clostridium difficile, not specified as recurrent: Secondary | ICD-10-CM | POA: Diagnosis not present

## 2019-05-14 DIAGNOSIS — Z794 Long term (current) use of insulin: Secondary | ICD-10-CM | POA: Diagnosis not present

## 2019-05-14 DIAGNOSIS — E119 Type 2 diabetes mellitus without complications: Secondary | ICD-10-CM | POA: Diagnosis not present

## 2019-05-15 DIAGNOSIS — A0472 Enterocolitis due to Clostridium difficile, not specified as recurrent: Secondary | ICD-10-CM | POA: Diagnosis not present

## 2019-05-22 DIAGNOSIS — Z794 Long term (current) use of insulin: Secondary | ICD-10-CM | POA: Diagnosis not present

## 2019-05-22 DIAGNOSIS — E119 Type 2 diabetes mellitus without complications: Secondary | ICD-10-CM | POA: Diagnosis not present

## 2019-05-22 DIAGNOSIS — E039 Hypothyroidism, unspecified: Secondary | ICD-10-CM | POA: Diagnosis not present

## 2019-05-22 DIAGNOSIS — I2510 Atherosclerotic heart disease of native coronary artery without angina pectoris: Secondary | ICD-10-CM | POA: Diagnosis not present

## 2019-05-22 DIAGNOSIS — Z0000 Encounter for general adult medical examination without abnormal findings: Secondary | ICD-10-CM | POA: Diagnosis not present

## 2019-05-22 DIAGNOSIS — A0472 Enterocolitis due to Clostridium difficile, not specified as recurrent: Secondary | ICD-10-CM | POA: Diagnosis not present

## 2019-06-04 DIAGNOSIS — H4313 Vitreous hemorrhage, bilateral: Secondary | ICD-10-CM | POA: Diagnosis not present

## 2019-06-04 DIAGNOSIS — E113512 Type 2 diabetes mellitus with proliferative diabetic retinopathy with macular edema, left eye: Secondary | ICD-10-CM | POA: Diagnosis not present

## 2019-06-04 DIAGNOSIS — E113591 Type 2 diabetes mellitus with proliferative diabetic retinopathy without macular edema, right eye: Secondary | ICD-10-CM | POA: Diagnosis not present

## 2019-06-04 DIAGNOSIS — H3582 Retinal ischemia: Secondary | ICD-10-CM | POA: Diagnosis not present

## 2019-06-05 DIAGNOSIS — I2583 Coronary atherosclerosis due to lipid rich plaque: Secondary | ICD-10-CM | POA: Diagnosis not present

## 2019-06-05 DIAGNOSIS — I2510 Atherosclerotic heart disease of native coronary artery without angina pectoris: Secondary | ICD-10-CM | POA: Diagnosis not present

## 2019-06-05 DIAGNOSIS — I10 Essential (primary) hypertension: Secondary | ICD-10-CM | POA: Diagnosis not present

## 2019-06-05 DIAGNOSIS — R600 Localized edema: Secondary | ICD-10-CM | POA: Diagnosis not present

## 2019-06-05 DIAGNOSIS — E7800 Pure hypercholesterolemia, unspecified: Secondary | ICD-10-CM | POA: Diagnosis not present

## 2019-06-22 DIAGNOSIS — I2510 Atherosclerotic heart disease of native coronary artery without angina pectoris: Secondary | ICD-10-CM | POA: Diagnosis not present

## 2019-06-24 DIAGNOSIS — I2510 Atherosclerotic heart disease of native coronary artery without angina pectoris: Secondary | ICD-10-CM | POA: Diagnosis not present

## 2019-06-24 DIAGNOSIS — I2583 Coronary atherosclerosis due to lipid rich plaque: Secondary | ICD-10-CM | POA: Diagnosis not present

## 2021-02-25 HISTORY — PX: AMPUTATION TOE: SHX6595

## 2021-05-13 ENCOUNTER — Encounter (HOSPITAL_COMMUNITY): Payer: Self-pay | Admitting: Emergency Medicine

## 2021-05-13 ENCOUNTER — Other Ambulatory Visit: Payer: Self-pay

## 2021-05-13 ENCOUNTER — Emergency Department (HOSPITAL_COMMUNITY): Payer: Medicare HMO

## 2021-05-13 ENCOUNTER — Emergency Department (HOSPITAL_COMMUNITY)
Admission: EM | Admit: 2021-05-13 | Discharge: 2021-05-13 | Disposition: A | Discharge status: Home / Self Care | Payer: Medicare HMO | Attending: Emergency Medicine | Admitting: Emergency Medicine

## 2021-05-13 DIAGNOSIS — N183 Chronic kidney disease, stage 3 unspecified: Secondary | ICD-10-CM | POA: Diagnosis not present

## 2021-05-13 DIAGNOSIS — I2510 Atherosclerotic heart disease of native coronary artery without angina pectoris: Secondary | ICD-10-CM | POA: Insufficient documentation

## 2021-05-13 DIAGNOSIS — E1122 Type 2 diabetes mellitus with diabetic chronic kidney disease: Secondary | ICD-10-CM | POA: Diagnosis not present

## 2021-05-13 DIAGNOSIS — I130 Hypertensive heart and chronic kidney disease with heart failure and stage 1 through stage 4 chronic kidney disease, or unspecified chronic kidney disease: Secondary | ICD-10-CM | POA: Diagnosis not present

## 2021-05-13 DIAGNOSIS — M25551 Pain in right hip: Secondary | ICD-10-CM | POA: Insufficient documentation

## 2021-05-13 DIAGNOSIS — Z23 Encounter for immunization: Secondary | ICD-10-CM | POA: Insufficient documentation

## 2021-05-13 DIAGNOSIS — Z951 Presence of aortocoronary bypass graft: Secondary | ICD-10-CM | POA: Diagnosis not present

## 2021-05-13 DIAGNOSIS — Z96653 Presence of artificial knee joint, bilateral: Secondary | ICD-10-CM | POA: Insufficient documentation

## 2021-05-13 DIAGNOSIS — J4540 Moderate persistent asthma, uncomplicated: Secondary | ICD-10-CM | POA: Diagnosis not present

## 2021-05-13 DIAGNOSIS — Z794 Long term (current) use of insulin: Secondary | ICD-10-CM | POA: Diagnosis not present

## 2021-05-13 DIAGNOSIS — Z7984 Long term (current) use of oral hypoglycemic drugs: Secondary | ICD-10-CM | POA: Diagnosis not present

## 2021-05-13 DIAGNOSIS — S52571A Other intraarticular fracture of lower end of right radius, initial encounter for closed fracture: Secondary | ICD-10-CM | POA: Diagnosis not present

## 2021-05-13 DIAGNOSIS — Z7982 Long term (current) use of aspirin: Secondary | ICD-10-CM | POA: Diagnosis not present

## 2021-05-13 DIAGNOSIS — I5042 Chronic combined systolic (congestive) and diastolic (congestive) heart failure: Secondary | ICD-10-CM | POA: Diagnosis not present

## 2021-05-13 DIAGNOSIS — E039 Hypothyroidism, unspecified: Secondary | ICD-10-CM | POA: Insufficient documentation

## 2021-05-13 DIAGNOSIS — Z7902 Long term (current) use of antithrombotics/antiplatelets: Secondary | ICD-10-CM | POA: Diagnosis not present

## 2021-05-13 DIAGNOSIS — W010XXA Fall on same level from slipping, tripping and stumbling without subsequent striking against object, initial encounter: Secondary | ICD-10-CM | POA: Insufficient documentation

## 2021-05-13 DIAGNOSIS — S6991XA Unspecified injury of right wrist, hand and finger(s), initial encounter: Secondary | ICD-10-CM | POA: Diagnosis present

## 2021-05-13 DIAGNOSIS — Z79899 Other long term (current) drug therapy: Secondary | ICD-10-CM | POA: Diagnosis not present

## 2021-05-13 MED ORDER — TETANUS-DIPHTH-ACELL PERTUSSIS 5-2.5-18.5 LF-MCG/0.5 IM SUSY
0.5000 mL | PREFILLED_SYRINGE | Freq: Once | INTRAMUSCULAR | Status: AC
  Administered 2021-05-13: 0.5 mL via INTRAMUSCULAR
  Filled 2021-05-13: qty 0.5

## 2021-05-13 MED ORDER — TRAMADOL HCL 50 MG PO TABS: 50.0000 mg | ORAL_TABLET | Freq: Four times a day (QID) | ORAL | 0 refills | Status: DC | PRN

## 2021-05-13 MED ORDER — TRAMADOL HCL 50 MG PO TABS
50.0000 mg | ORAL_TABLET | Freq: Once | ORAL | Status: AC
  Administered 2021-05-13: 50 mg via ORAL
  Filled 2021-05-13: qty 1

## 2021-05-13 NOTE — ED Notes (Signed)
Ortho at bedside applying sugar tong splint to patient right, lower arm.

## 2021-05-13 NOTE — ED Triage Notes (Addendum)
Pt to triage via [facility] from home.  Pt tripped and fell around 6pm last night.  C/o R wrist and R hip pain.  States she has been able to ambulate but hip is sore.  Unable to sleep due to pain.  Felt like she was going to pass out this morning.   CBG 221 after eating a honey bun this morning  IV- 20g LAC Zofran 4mg  IV  Refused pain meds from EMS

## 2021-05-13 NOTE — ED Provider Notes (Signed)
[facility] EMERGENCY DEPARTMENT Provider Note   CSN: [id number] Arrival date & time: 05/13/21  2229     History Chief Complaint  Patient presents with   Carol [name]    Carol [name] is a 85 y.o. female.   Fall  Patient presented to the ED for evaluation after a fall last evening.  Patient was walking to her neighbor is walking up a path when she tripped on the curb next to the edge of the grass.  Patient ended up falling backwards.  She landed on her right hip and right wrist.  Patient was able to get up and walk on her own.  She continues to have some soreness in her hip but has noticed more pain and swelling in her right wrist.  She denies any headache or loss of consciousness.  She is taking Plavix but not anticoagulants.  This morning patient felt lightheaded a few times when she was trying to get up.  She was was brought to the ED by EMS.  Since waiting here the patient has been able to get up and walk to the bathroom without feeling lightheaded or dizzy.  She is not having any chest pain or fevers.  She denies any numbness or weakness    Past Medical History:  Diagnosis Date   Anemia    a. Noted on labs 02/2014 possibly procedurally related.   Asthma    CAD S/P percutaneous coronary angioplasty 01/22/2014   A. (12/2013): POBA - OM2 99% (very tortuous segment) - reduced ~ 50% & TIMI 3 flow, 80-90% stenosis in mid PDA, Irregularities <50% in mRCA, pLAD and prox LCx; b. 02/17/14: Moderate sized fixed defect in Lateral wall --> cath with OM2 restenosis & otherwise stable --> Xience Alpine DES x 2 (2.25 mm x 12 & 8 mm). c. NSTEMI 02/2014 s/p PTCA/balloon angioplasty only to small caliber PDA.   Carotid stenosis    Carotid US 11/17: L 1-39; FU prn   Chronic systolic heart failure ([place])    a. ICM b. ECHO (01/2014): EF 25-30%, grade I DD, trivial MR. c. EF by Myoview 02/17/14 = ~53%. d. EF by cath 02/24/14 - improved to 50-55%.   CKD (chronic kidney disease), stage III (HCC)     Contrast media allergy    Environmental allergies    GERD (gastroesophageal reflux disease)    Gout, unspecified    HOH (hard of hearing)    Hyperlipidemia    a. H/o intolerance to Zocor, Lipitor. Had muscle aches on higher dose Crestor.   Hypertension    Hypothyroidism    LBBB (left bundle branch block)    a. Transient during 02/2014 admission.   NSTEMI (non-ST elevated myocardial infarction) ([place]) 2014; 12/2013, 01/2014   QT prolongation    a. Noted on EKG 02/2014.   Sinus bradycardia    a. HR 40s-50s during 02/2014 admission, limiting BB dose.   TIA (transient ischemic attack) 1990's   a. TIA vs stroke 1990's "mild" - sounds like a TIA as she was told she had stroke symptoms but negative scans. Denies residual effects.   Type II diabetes mellitus [facility])     Patient Active Problem List   Diagnosis Date Noted   S/P total knee replacement 04/21/2018   Chronic combined systolic and diastolic heart failure ([place]) 04/19/2015   GERD (gastroesophageal reflux disease) 04/19/2015   Spinal stenosis, lumbar region, with neurogenic claudication 12/22/2014   Diabetes ([place]) 04/10/2014   Asthma, moderate persistent 03/23/2014  Post-infarction angina ([place]) 02/17/2014   Abnormal nuclear stress test: Intermediate risk 02/17/2014   Presence of drug coated stent in left circumflex coronary artery: OM2 - Xience Alpine DES 2.25 mm x 12, 2.25 mm x 8 mm overlap 02/17/2014    Class: Acute   Ischemic cardiomyopathy - with near resolution of LVEF post MI 02/15/2014   CKD stage 3 due to type 2 diabetes mellitus ([place]) 02/15/2014   Burning chest pain 01/22/2014   Hyperlipidemia with target LDL less than 70 01/22/2014   History of non-ST elevation myocardial infarction (NSTEMI) 01/22/2014   Coronary artery disease involving coronary bypass graft of native heart with angina pectoris ([place]) 01/22/2014   Mixed conductive and sensorineural hearing loss 12/05/2012   Essential hypertension 12/05/2012   Unspecified  hypothyroidism 12/05/2012   Lower back pain 12/05/2012   Gout, unspecified     Past Surgical History:  Procedure Laterality Date   ABDOMINAL HYSTERECTOMY     complete   APPENDECTOMY  1959   CARDIAC CATHETERIZATION  02/24/2014   Procedure: CORONARY BALLOON ANGIOPLASTY;  Surgeon: Burnell Blanks, MD;  Location[facility] C[facility] LAB;  Service: Cardiovascular;;   CARDIAC CATHETERIZATION N/A 04/20/2015   Procedure: Left Heart Cath and Coronary Angiography;  Surgeon: Belva Crome, MD;  Location: [place] CV LAB;  Service: Cardiovascular;  Laterality: N/A;   CATARACT EXTRACTION W/ INTRAOCULAR LENS  IMPLANT, BILATERAL Bilateral 2013   CESAREAN SECTION  1959; [place]  12/2013   POBA - OM2   CORONARY ARTERY BYPASS GRAFT N/A 04/25/2015   Procedure: CORONARY ARTERY BYPASS GRAFTING (CABG) x four,  using left internal mammary artery and right leg greater saphenous vein harvested endoscopically;  Surgeon: Gaye Pollack, MD;  Location: [place];  Service: Open Heart Surgery;  Laterality: N/A;   JOINT REPLACEMENT  10 years ago    right knee   LEFT HEART CATHETERIZATION WITH CORONARY ANGIOGRAM N/A 01/22/2014   Procedure: LEFT HEART CATHETERIZATION WITH CORONARY ANGIOGRAM;  Surgeon: Sinclair Grooms, MD;  Location: [facility] CATH LAB;  Service: Cardiovascular;  Laterality: N/A;   LEFT HEART CATHETERIZATION WITH CORONARY ANGIOGRAM N/A 02/17/2014   Procedure: LEFT HEART CATHETERIZATION WITH CORONARY ANGIOGRAM;  Surgeon: Burnell Blanks, MD;  Location: [facility] CATH LAB;  Service: Cardiovascular;  Laterality: N/A;   LEFT HEART CATHETERIZATION WITH CORONARY ANGIOGRAM N/A 02/24/2014   Procedure: LEFT HEART CATHETERIZATION WITH CORONARY ANGIOGRAM;  Surgeon: Burnell Blanks, MD;  Location: [facility] CATH LAB;  Service: Cardiovascular;  Laterality: N/A;   LUMBAR LAMINECTOMY/DECOMPRESSION MICRODISCECTOMY Left 12/22/2014   Procedure: CENTRAL DECOMPRESSIVE LUMBAR, AND LAMINECTOMY  L5-S1,  S1-S2 FOR SPINAL STENOSIS FORAMINOTOMY L5, S1, S2 ROOTS ON LEFT FOR FORAMINAL STENOSIS, AND DECOMPRESSION L5-S1, S1-S2 ACCORDING TO LABELING OF [place] X-RAYS;  Surgeon: Latanya Maudlin, MD;  Location: [facility];  Service: Orthopedics;  Laterality: Left;   PERCUTANEOUS CORONARY STENT INTERVENTION (PCI-S)  02/17/14   For restenosis of OM2 - PCI with Xience Apline DES 2.25 mm x 12 mm & 2.25 mm x 8 mm overlapping   REDUCTION MAMMAPLASTY Bilateral 1990's   SHOULDER OPEN ROTATOR CUFF REPAIR Bilateral 1980's - 1990's   TEE WITHOUT CARDIOVERSION N/A 04/25/2015   Procedure: TRANSESOPHAGEAL ECHOCARDIOGRAM (TEE);  Surgeon: Gaye Pollack, MD;  Location: [place];  Service: Open Heart Surgery;  Laterality: N/A;   TOTAL KNEE ARTHROPLASTY Right 2009   TOTAL KNEE ARTHROPLASTY Left 04/21/2018   Procedure: LEFT TOTAL KNEE ARTHROPLASTY;  Surgeon: Ronnie Derby,  Richardson Landry, MD;  Location: [facility];  Service: Orthopedics;  Laterality: Left;  Adductor Block     OB History   No obstetric history on file.     Family History  Problem Relation Age of Onset   Hypertension Mother    Diabetes Mother    Heart attack Mother 23   Heart disease Father    Emphysema Father    Diabetes Brother    Heart disease Brother    Cancer Sister    Stroke Neg Hx     Social History   Tobacco Use   Smoking status: Never   Smokeless tobacco: Never  Vaping Use   Vaping Use: Never used  Substance Use Topics   Alcohol use: No   Drug use: No    Home Medications Prior to Admission medications   Medication Sig Start Date End Date Taking? Authorizing Provider  traMADol (ULTRAM) 50 MG tablet Take 1 tablet (50 mg total) by mouth every 6 (six) hours as needed. 05/13/21  Yes Dorie Rank, MD  allopurinol (ZYLOPRIM) 300 MG tablet Take 300 mg by mouth every morning.     [provider]  aspirin EC 81 MG tablet Take 1 tablet (81 mg total) by mouth daily. 05/04/19   Belva Crome, MD  Biotin 5000 MCG CAPS Take 5,000 mcg by  mouth every morning.     [provider]  carvedilol (COREG) 6.25 MG tablet Take 6.25 mg by mouth 2 (two) times daily with a meal.    [provider]  clopidogrel (PLAVIX) 75 MG tablet Take 1 tablet (75 mg total) by mouth daily. 05/24/16   Burnell Blanks, MD  colchicine 0.6 MG tablet Take 0.6 mg by mouth daily as needed (gout flare up.).  02/19/14   Thurnell Lose, MD  empagliflozin (JARDIANCE) 25 MG TABS tablet Take 25 mg by mouth daily.    [provider]  ezetimibe (ZETIA) 10 MG tablet Take 10 mg by mouth daily. 10/14/18   [provider]  famotidine (PEPCID) 10 MG tablet Take 10 mg by mouth 2 (two) times daily as needed for heartburn or indigestion.     [provider]  furosemide (LASIX) 80 MG tablet Take 80 mg by mouth daily.    [provider]  gabapentin (NEURONTIN) 300 MG capsule Take 300 mg by mouth 2 (two) times daily.    [provider]  insulin NPH-regular Human (NOVOLIN 70/30) (70-30) 100 UNIT/ML injection Inject 25-32 Units into the skin daily. 05/27/15   [provider]  isosorbide mononitrate (IMDUR) 60 MG 24 hr tablet Take 1 tablet (60 mg total) by mouth daily. 06/07/17 06/02/18  Belva Crome, MD  Lactobacillus Acid-Pectin (ACIDOPHILUS/PECTIN) CAPS Take 1 tablet by mouth daily. 04/11/19   [provider]  levothyroxine (SYNTHROID, LEVOTHROID) 50 MCG tablet Take 50 mcg by mouth every morning.  05/31/14   [provider]  methocarbamol (ROBAXIN) 500 MG tablet Take 1-2 tablets (500-1,000 mg total) by mouth every 6 (six) hours as needed for muscle spasms. 04/22/18   Donia Ast, PA  nitroGLYCERIN (NITROSTAT) 0.4 MG SL tablet Place 0.4 mg under the tongue as needed.    [provider]  ONE TOUCH ULTRA TEST test strip 1 each by Other route as needed for other.  04/18/15   [provider]  oxyCODONE (OXY IR/ROXICODONE) 5 MG immediate release tablet Take 1-2 tablets  (5-10 mg total) by mouth every 6 (six) hours as needed for moderate pain (  pain score 4-6). 04/22/18   Donia Ast, PA  pantoprazole (PROTONIX) 40 MG tablet TAKE 1 TABLET BY MOUTH ONCE DAILY Patient taking differently: Take 40 mg by mouth daily.  08/12/15   Burnell Blanks, MD  traMADol (ULTRAM) 50 MG tablet Take 50 mg by mouth as needed. 10/20/18   [provider]    Allergies    Contrast media [iodinated diagnostic agents], Cortizone-10 [hydrocortisone], Lipitor [atorvastatin], and Zocor [simvastatin]  Review of Systems   Review of Systems  All other systems reviewed and are negative.  Physical Exam Updated Vital Signs BP (!) 175/80   Pulse 68   Temp (!) 97 F (36.1 C)   Resp 15   SpO2 98%   Physical Exam Vitals and nursing note reviewed.  Constitutional:      General: She is not in acute distress.    Appearance: She is well-developed.  HENT:     Head: Normocephalic and atraumatic.     Right Ear: External ear normal.     Left Ear: External ear normal.  Eyes:     General: No scleral icterus.       Right eye: No discharge.        Left eye: No discharge.     Conjunctiva/sclera: Conjunctivae normal.  Neck:     Trachea: No tracheal deviation.  Cardiovascular:     Rate and Rhythm: Normal rate and regular rhythm.  Pulmonary:     Effort: Pulmonary effort is normal. No respiratory distress.     Breath sounds: Normal breath sounds. No stridor. No wheezing or rales.  Abdominal:     General: Bowel sounds are normal. There is no distension.     Palpations: Abdomen is soft.     Tenderness: There is no abdominal tenderness. There is no guarding or rebound.  Musculoskeletal:        General: No deformity.     Right shoulder: Normal.     Left shoulder: Normal.     Right elbow: No deformity. Lacerations: small abrasion noted right elbow.No tenderness.     Left elbow: No deformity. No tenderness.     Right wrist: Swelling, tenderness and bony tenderness  present. Decreased range of motion.     Right hand: Normal.     Left hand: Normal.     Cervical back: Normal and neck supple.     Thoracic back: Normal.     Lumbar back: Normal.     Right hip: Tenderness present. No crepitus. Normal range of motion.     Left hip: Normal.  Skin:    General: Skin is warm and dry.     Findings: No rash.  Neurological:     General: No focal deficit present.     Mental Status: She is alert.     Cranial Nerves: No cranial nerve deficit (no facial droop, extraocular movements intact, no slurred speech).     Sensory: No sensory deficit.     Motor: No abnormal muscle tone or seizure activity.     Coordination: Coordination normal.  Psychiatric:        Mood and Affect: Mood normal.    ED Results / Procedures / Treatments   Labs (all labs ordered are listed, but only abnormal results are displayed) Labs Reviewed - No data to display  EKG None  Radiology DG Wrist Complete Right  Result Date: 05/13/2021 CLINICAL DATA:  Right wrist pain after fall EXAM: RIGHT WRIST - COMPLETE 3+ VIEW COMPARISON:  None. FINDINGS: Acute  comminuted intra-articular fracture of the distal radius with minimal displacement of fragments. Suspected subtle acute nondisplaced fracture of the ulnar styloid process. Carpal bones appear intact. Degenerative changes at the first carpometacarpal joint noted. Chondrocalcinosis distal to the ulna. Soft tissue swelling of the wrist. IMPRESSION: Acute comminuted intra-articular fracture of the distal radius. Suspected subtle acute nondisplaced fracture of the ulnar styloid. Electronically Signed   By: Ofilia Neas M.D.   On: 05/13/2021 11:58   DG Hip Unilat  With Pelvis 2-3 Views Right  Result Date: 05/13/2021 CLINICAL DATA:  Right hip pain after fall EXAM: DG HIP (WITH OR WITHOUT PELVIS) 2-3V RIGHT COMPARISON:  None. FINDINGS: No acute fracture or dislocation identified. Mild-to-moderate narrowing of the hip joint space small marginal  osteophytes. Bones are osteopenic. Arterial vascular calcifications noted. IMPRESSION: No acute osseous abnormality identified. Electronically Signed   By: Ofilia Neas M.D.   On: 05/13/2021 11:56    Procedures Procedures   Medications Ordered in ED Medications  Tdap (BOOSTRIX) injection 0.5 mL (has no administration in time range)    ED Course  I have reviewed the triage vital signs and the nursing notes.  Pertinent labs & imaging results that were available during my care of the patient were reviewed by me and considered in my medical decision making (see chart for details).  Clinical Course as of 05/13/21 1524  Sat May 13, 2021  1456 DG Wrist Complete Right [JK]  1456 DG Wrist Complete Right Images reviewed.  Intra-articular distal radius fracture noted [JK]  1457 DG Hip Unilat  With Pelvis 2-3 Views Right Images reviewed.  No evidence of fracture [JK]    Clinical Course User Index [JK] Dorie Rank, MD   MDM Rules/Calculators/A&P                          Has an intra-articular right distal radius fracture.  No significant displacement.  No signs of neurovascular deficit.  Patient was placed in a splint.  Recommend outpatient follow-up with orthopedics.  Tetanus updated for her abrasion.  Patient also complained of hip pain but x-ray does not show any fracture and she has been able to walk with out significant pain or discomfort.  Doubt occult fracture.  Fall is mechanical.  No head injury or loss of consciousness.  Final Clinical Impression(s) / ED Diagnoses Final diagnoses:  Other closed intra-articular fracture of distal end of right radius, initial encounter    Rx / DC Orders ED Discharge Orders          Ordered    traMADol (ULTRAM) 50 MG tablet  Every 6 hours PRN        05/13/21 1522             Dorie Rank, MD 05/13/21 1525

## 2021-05-13 NOTE — Discharge Instructions (Signed)
Follow up with an orthopedic surgeon regarding your wrist injury.  Keep the splint on in the meantime.  Apply ice to help with swelling.  Take tylenol or the tramadol as needed for pain.

## 2021-05-13 NOTE — Progress Notes (Signed)
Orthopedic Tech Progress Note Patient Details:  Carol [name] 1936/06/25 [id number]  Ortho Devices Type of Ortho Device: Ace wrap, Arm sling, Sugartong splint Ortho Device/Splint Location: right Ortho Device/Splint Interventions: Application   Post Interventions Patient Tolerated: Well Instructions Provided: Care of device  [name] 05/13/2021, 3:19 PM

## 2021-06-27 DIAGNOSIS — H43811 Vitreous degeneration, right eye: Secondary | ICD-10-CM | POA: Diagnosis not present

## 2021-06-27 DIAGNOSIS — E113593 Type 2 diabetes mellitus with proliferative diabetic retinopathy without macular edema, bilateral: Secondary | ICD-10-CM | POA: Diagnosis not present

## 2021-06-27 DIAGNOSIS — H4312 Vitreous hemorrhage, left eye: Secondary | ICD-10-CM | POA: Diagnosis not present

## 2021-06-27 DIAGNOSIS — H35371 Puckering of macula, right eye: Secondary | ICD-10-CM | POA: Diagnosis not present

## 2021-07-11 DIAGNOSIS — S52511A Displaced fracture of right radial styloid process, initial encounter for closed fracture: Secondary | ICD-10-CM | POA: Diagnosis not present

## 2021-07-19 DIAGNOSIS — H4312 Vitreous hemorrhage, left eye: Secondary | ICD-10-CM | POA: Diagnosis not present

## 2021-07-19 DIAGNOSIS — H43811 Vitreous degeneration, right eye: Secondary | ICD-10-CM | POA: Diagnosis not present

## 2021-07-19 DIAGNOSIS — E113593 Type 2 diabetes mellitus with proliferative diabetic retinopathy without macular edema, bilateral: Secondary | ICD-10-CM | POA: Diagnosis not present

## 2021-07-19 DIAGNOSIS — H35371 Puckering of macula, right eye: Secondary | ICD-10-CM | POA: Diagnosis not present

## 2021-07-19 DIAGNOSIS — E113592 Type 2 diabetes mellitus with proliferative diabetic retinopathy without macular edema, left eye: Secondary | ICD-10-CM | POA: Diagnosis not present

## 2021-08-10 DIAGNOSIS — H4312 Vitreous hemorrhage, left eye: Secondary | ICD-10-CM | POA: Diagnosis not present

## 2021-08-15 DIAGNOSIS — S52511A Displaced fracture of right radial styloid process, initial encounter for closed fracture: Secondary | ICD-10-CM | POA: Diagnosis not present

## 2021-08-22 DIAGNOSIS — J988 Other specified respiratory disorders: Secondary | ICD-10-CM | POA: Diagnosis not present

## 2021-08-22 DIAGNOSIS — Z20822 Contact with and (suspected) exposure to covid-19: Secondary | ICD-10-CM | POA: Diagnosis not present

## 2021-09-07 DIAGNOSIS — E113592 Type 2 diabetes mellitus with proliferative diabetic retinopathy without macular edema, left eye: Secondary | ICD-10-CM | POA: Diagnosis not present

## 2021-09-09 DIAGNOSIS — J069 Acute upper respiratory infection, unspecified: Secondary | ICD-10-CM | POA: Diagnosis not present

## 2021-09-09 DIAGNOSIS — R051 Acute cough: Secondary | ICD-10-CM | POA: Diagnosis not present

## 2021-09-28 ENCOUNTER — Encounter: Payer: Self-pay | Admitting: Family Medicine

## 2021-09-28 ENCOUNTER — Ambulatory Visit (INDEPENDENT_AMBULATORY_CARE_PROVIDER_SITE_OTHER): Payer: Medicare HMO | Admitting: Family Medicine

## 2021-09-28 VITALS — BP 138/60 | HR 64 | Temp 97.8°F | Ht 64.74 in | Wt 200.8 lb

## 2021-09-28 DIAGNOSIS — N183 Chronic kidney disease, stage 3 unspecified: Secondary | ICD-10-CM

## 2021-09-28 DIAGNOSIS — Z13820 Encounter for screening for osteoporosis: Secondary | ICD-10-CM

## 2021-09-28 DIAGNOSIS — E1122 Type 2 diabetes mellitus with diabetic chronic kidney disease: Secondary | ICD-10-CM

## 2021-09-28 DIAGNOSIS — E785 Hyperlipidemia, unspecified: Secondary | ICD-10-CM | POA: Diagnosis not present

## 2021-09-28 DIAGNOSIS — I130 Hypertensive heart and chronic kidney disease with heart failure and stage 1 through stage 4 chronic kidney disease, or unspecified chronic kidney disease: Secondary | ICD-10-CM

## 2021-09-28 DIAGNOSIS — I25709 Atherosclerosis of coronary artery bypass graft(s), unspecified, with unspecified angina pectoris: Secondary | ICD-10-CM

## 2021-09-28 DIAGNOSIS — Z89421 Acquired absence of other right toe(s): Secondary | ICD-10-CM | POA: Diagnosis not present

## 2021-09-28 DIAGNOSIS — M791 Myalgia, unspecified site: Secondary | ICD-10-CM

## 2021-09-28 DIAGNOSIS — T466X5A Adverse effect of antihyperlipidemic and antiarteriosclerotic drugs, initial encounter: Secondary | ICD-10-CM

## 2021-09-28 DIAGNOSIS — R5383 Other fatigue: Secondary | ICD-10-CM

## 2021-09-28 DIAGNOSIS — K5904 Chronic idiopathic constipation: Secondary | ICD-10-CM | POA: Insufficient documentation

## 2021-09-28 MED ORDER — TRAMADOL HCL 50 MG PO TABS
50.0000 mg | ORAL_TABLET | Freq: Four times a day (QID) | ORAL | 0 refills | Status: DC | PRN
Start: 2021-09-28 — End: 2023-09-30

## 2021-09-28 MED ORDER — LINACLOTIDE 72 MCG PO CAPS: 72.0000 ug | ORAL_CAPSULE | Freq: Every day | ORAL | 2 refills | Status: DC

## 2021-09-28 NOTE — Progress Notes (Addendum)
? ?New Patient Office Visit ? ?Subjective:  ?Patient ID: Carol [name], female    DOB: May 16, 1936  Age: 86 y.o. MRN: [id number] ? ?CC:  ?Chief Complaint  ?Patient presents with  ? Establish Care  ? ? ?HPI ?Carol [name] presents to establish care. Recently moved back from [place], [place], has a son and grandchildren close by. Lives by herself currently with her dog Buster. Widow of 11 years. Wears hearing aides. Has never smoke cigarettes, used alcohol, drugs. Checks daily fasting BG, unless symptomatic. Has been having lows in AM recently (40-50s) and are up . Does not eat the best diet. Checks blood sugars daily until monitor broke which was two weeks ago. Ranges 120-130/ 60-70. Complains of weakness, shortness of breath, and decreased energy for the past year. In the past took B12 injections. She plans to see Cardiology but does not have a current appointment. Saw an allergist in the past (1970s). Last saw a podiatrist in September.  ? ?Patient was on steroids twice in the last couple of months. Sugars were up to 4-500s. Last a1c 7.5. Has a poor appetite. NPH 70/30 32 U daily. Patient did add 12 U  before bedtime while on steroids. Tradjenta 5 mg once daily. Dxd in her 95s.  ? ?She fell in November when moving, alone. She was crossing street in dark and lost balance. Injury did include break in R arm. No surgery with this injury.  ? ?CORONARY ARTERY DISEASE: Stents. 3 heart attacks. CABG. MCHG: Dr. Tamala Julian. On zetia 10 mg daily, plavix 75 mg daily, Imdur 60 mg daily. Aspirin 81 mg once daily. Carvedilol 6.25 mg one twice daily.  ? ?Hypothyroidism: on synthroid 50 mcg once daily.  ?B12 deficiency: Previously took b12 shots.  Taking oral B12.  ? ?Chronic Pain: On tramadol  ?Patient has chronic pain in right great toe. Had hammer toe  ?History of back pain and knee pain. Has had tkr.  ? ?ASTHMA: on albuterol mdi only uses once every 3 months.allergy medicines: flonase..  ? ?Chronic idiopathic constipation: Failed  on sennakot and miralax. BMS every 2-3 days. Has take dulcolax 90% of the time.  ? ?Past Medical History:  ?Diagnosis Date  ? Anemia   ? a. Noted on labs 02/2014 possibly procedurally related.  ? Asthma   ? CAD S/P percutaneous coronary angioplasty 01/22/2014  ? A. (12/2013): POBA - OM2 99% (very tortuous segment) - reduced ~ 50% & TIMI 3 flow, 80-90% stenosis in mid PDA, Irregularities <50% in mRCA, pLAD and prox LCx; b. 02/17/14: Moderate sized fixed defect in Lateral wall --> cath with OM2 restenosis & otherwise stable --> Xience Alpine DES x 2 (2.25 mm x 12 & 8 mm). c. NSTEMI 02/2014 s/p PTCA/balloon angioplasty only to small caliber PDA.  ? Carotid stenosis   ? Carotid US 11/17: L 1-39; FU prn  ? Chronic systolic heart failure ([place])   ? a. ICM b. ECHO (01/2014): EF 25-30%, grade I DD, trivial MR. c. EF by Myoview 02/17/14 = ~53%. d. EF by cath 02/24/14 - improved to 50-55%.  ? CKD (chronic kidney disease), stage III ([place])   ? Contrast media allergy   ? Environmental allergies   ? GERD (gastroesophageal reflux disease)   ? Gout, unspecified   ? HOH (hard of hearing)   ? Hyperlipidemia   ? a. H/o intolerance to Zocor, Lipitor. Had muscle aches on higher dose Crestor.  ? Hypertension   ? Hypothyroidism   ? LBBB (left  bundle branch block)   ? a. Transient during 02/2014 admission.  ? NSTEMI (non-ST elevated myocardial infarction) ([place]) 2014; 12/2013, 01/2014  ? QT prolongation   ? a. Noted on EKG 02/2014.  ? Sinus bradycardia   ? a. HR 40s-50s during 02/2014 admission, limiting BB dose.  ? TIA (transient ischemic attack) 1990's  ? a. TIA vs stroke 1990's "mild" - sounds like a TIA as she was told she had stroke symptoms but negative scans. Denies residual effects.  ? Type II diabetes mellitus ([place])   ? ? ?Past Surgical History:  ?Procedure Laterality Date  ? ABDOMINAL HYSTERECTOMY    ? complete  ? AMPUTATION TOE Right 02/25/2021  ? hammer toe.  ? APPENDECTOMY  1959  ? CARDIAC CATHETERIZATION  02/24/2014  ? Procedure: CORONARY  BALLOON ANGIOPLASTY;  Surgeon: Burnell Blanks, MD;  Location: [facility] CATH LAB;  Service: Cardiovascular;;  ? CARDIAC CATHETERIZATION N/A 04/20/2015  ? Procedure: Left Heart Cath and Coronary Angiography;  Surgeon: Belva Crome, MD;  Location: [place] CV LAB;  Service: Cardiovascular;  Laterality: N/A;  ? CATARACT EXTRACTION W/ INTRAOCULAR LENS  IMPLANT, BILATERAL Bilateral 2013  ? [place]; 1960  ? CHOLECYSTECTOMY  1989  ? CORONARY ANGIOPLASTY  12/2013  ? POBA - OM2  ? CORONARY ARTERY BYPASS GRAFT N/A 04/25/2015  ? Procedure: CORONARY ARTERY BYPASS GRAFTING (CABG) x four,  using left internal mammary artery and right leg greater saphenous vein harvested endoscopically;  Surgeon: Gaye Pollack, MD;  Location: [place] OR;  Service: Open Heart Surgery;  Laterality: N/A;  ? JOINT REPLACEMENT Right 10 years ago   ? right knee (TKR)  ? LEFT HEART CATHETERIZATION WITH CORONARY ANGIOGRAM N/A 01/22/2014  ? Procedure: LEFT HEART CATHETERIZATION WITH CORONARY ANGIOGRAM;  Surgeon: Sinclair Grooms, MD;  Location: [facility] CATH LAB;  Service: Cardiovascular;  Laterality: N/A;  ? LEFT HEART CATHETERIZATION WITH CORONARY ANGIOGRAM N/A 02/17/2014  ? Procedure: LEFT HEART CATHETERIZATION WITH CORONARY ANGIOGRAM;  Surgeon: Burnell Blanks, MD;  Location: [facility] CATH LAB;  Service: Cardiovascular;  Laterality: N/A;  ? LEFT HEART CATHETERIZATION WITH CORONARY ANGIOGRAM N/A 02/24/2014  ? Procedure: LEFT HEART CATHETERIZATION WITH CORONARY ANGIOGRAM;  Surgeon: Burnell Blanks, MD;  Location: [facility] CATH LAB;  Service: Cardiovascular;  Laterality: N/A;  ? LUMBAR LAMINECTOMY/DECOMPRESSION MICRODISCECTOMY Left 12/22/2014  ? Procedure: CENTRAL DECOMPRESSIVE LUMBAR, AND LAMINECTOMY L5-S1,  S1-S2 FOR SPINAL STENOSIS FORAMINOTOMY L5, S1, S2 ROOTS ON LEFT FOR FORAMINAL STENOSIS, AND DECOMPRESSION L5-S1, S1-S2 ACCORDING TO LABELING OF [place] X-RAYS;  Surgeon: Latanya Maudlin, MD;  Location[facility];  Service:  Orthopedics;  Laterality: Left;  ? PERCUTANEOUS CORONARY STENT INTERVENTION (PCI-S)  02/17/2014  ? For restenosis of OM2 - PCI with Xience Apline DES 2.25 mm x 12 mm & 2.25 mm x 8 mm overlapping  ? REDUCTION MAMMAPLASTY Bilateral 1990's  ? SHOULDER OPEN ROTATOR CUFF REPAIR Bilateral 1980's - 1990's  ? TEE WITHOUT CARDIOVERSION N/A 04/25/2015  ? Procedure: TRANSESOPHAGEAL ECHOCARDIOGRAM (TEE);  Surgeon: Gaye Pollack, MD;  Location: [place];  Service: Open Heart Surgery;  Laterality: N/A;  ? TOTAL KNEE ARTHROPLASTY Right 2009  ? TOTAL KNEE ARTHROPLASTY Left 04/21/2018  ? TKR;  Surgeon: Vickey Huger, MD;  Location: [facility];  Service: Orthopedics;  Laterality: Left;  Adductor Block  ? ? ?Family History  ?Problem Relation Age of Onset  ? Hypertension Mother   ? Diabetes Mother   ? Heart attack Mother 81  ? Heart disease  Father   ? Emphysema Father   ? Diabetes Brother   ? Heart disease Brother   ? Cancer Sister   ? Stroke Neg Hx   ? ? ?Social History  ? ?Socioeconomic History  ? Marital status: Widowed  ?  Spouse name: Not on file  ? Number of children: 2  ? Years of education: Not on file  ? Highest education level: Not on file  ?Occupational History  ? Occupation: Retired  ?Tobacco Use  ? Smoking status: Never  ? Smokeless tobacco: Never  ?Vaping Use  ? Vaping Use: Never used  ?Substance and Sexual Activity  ? Alcohol use: No  ? Drug use: No  ? Sexual activity: Never  ?Other Topics Concern  ? Not on file  ?Social History Narrative  ? Lives in [place] by herself.   ? ?Social Determinants of Health  ? ?Financial Resource Strain: Low Risk   ? Difficulty of Paying Living Expenses: Not hard at all  ?Food Insecurity: No Food Insecurity  ? Worried About Charity fundraiser in the Last Year: Never true  ? Ran Out of Food in the Last Year: Never true  ?Transportation Needs: No Transportation Needs  ? Lack of Transportation (Medical): No  ? Lack of Transportation (Non-Medical): No  ?Physical Activity: Sufficiently Active  ? Days  of Exercise per Week: 6 days  ? Minutes of Exercise per Session: 40 min  ?Stress: No Stress Concern Present  ? Feeling of Stress : Not at all  ?Social Connections: Not on file  ?Intimate Partner Violence: N

## 2021-09-28 NOTE — Patient Instructions (Addendum)
Linzess 75 mg once daily in am.  ?Please eat 3 meals according to education.  ? ?Change Novolin 70/30 to 15 U before breakfast and Supper.  ? ?

## 2021-09-29 ENCOUNTER — Telehealth: Payer: Self-pay | Admitting: *Deleted

## 2021-09-29 ENCOUNTER — Other Ambulatory Visit: Payer: Medicare HMO

## 2021-09-29 ENCOUNTER — Other Ambulatory Visit: Payer: Self-pay

## 2021-09-29 DIAGNOSIS — E1122 Type 2 diabetes mellitus with diabetic chronic kidney disease: Secondary | ICD-10-CM

## 2021-09-29 DIAGNOSIS — N183 Chronic kidney disease, stage 3 unspecified: Secondary | ICD-10-CM | POA: Diagnosis not present

## 2021-09-29 DIAGNOSIS — Z794 Long term (current) use of insulin: Secondary | ICD-10-CM

## 2021-09-29 DIAGNOSIS — R5383 Other fatigue: Secondary | ICD-10-CM | POA: Diagnosis not present

## 2021-09-29 MED ORDER — FREESTYLE LIBRE READER DEVI: 1.0000 | 0 refills | Status: DC

## 2021-09-29 NOTE — Chronic Care Management (AMB) (Signed)
?  Care Management  ? ?Note ? ?09/29/2021 ?Name: Carol [name] MRN: [id number] DOB: 15-Jan-1936 ? ?Carol [name] is a 86 y.o. year old female who is a primary care patient of Cox, Kirsten, MD. I reached out to [name] by phone today offer care coordination services.  ? ?Carol [name] was given information about care management services today including:  ?Care management services include personalized support from designated clinical staff supervised by her physician, including individualized plan of care and coordination with other care providers ?24/7 contact phone numbers for assistance for urgent and routine care needs. ?The patient may stop care management services at any time by phone call to the office staff. ? ?Patient agreed to services and verbal consent obtained.  ? ?Follow up plan: ?Telephone appointment with care management team member scheduled for:10/06/21 ? ?Laverda Sorenson  ?Care Guide, Embedded Care Coordination ?[facility]  ?Direct Dial: [phone] ? ?

## 2021-09-30 LAB — CBC WITH DIFFERENTIAL/PLATELET
Basophils Absolute: 0.1 10*3/uL (ref 0.0–0.2)
Basos: 1 %
EOS (ABSOLUTE): 0.1 10*3/uL (ref 0.0–0.4)
Eos: 1 %
Hematocrit: 34.9 % (ref 34.0–46.6)
Hemoglobin: 11.8 g/dL (ref 11.1–15.9)
Immature Grans (Abs): 0 10*3/uL (ref 0.0–0.1)
Immature Granulocytes: 0 %
Lymphocytes Absolute: 1.7 10*3/uL (ref 0.7–3.1)
Lymphs: 19 %
MCH: 30.6 pg (ref 26.6–33.0)
MCHC: 33.8 g/dL (ref 31.5–35.7)
MCV: 91 fL (ref 79–97)
Monocytes Absolute: 0.7 10*3/uL (ref 0.1–0.9)
Monocytes: 8 %
Neutrophils Absolute: 6.3 10*3/uL (ref 1.4–7.0)
Neutrophils: 71 %
Platelets: 202 10*3/uL (ref 150–450)
RBC: 3.85 x10E6/uL (ref 3.77–5.28)
RDW: 13.9 % (ref 11.7–15.4)
WBC: 8.9 10*3/uL (ref 3.4–10.8)

## 2021-09-30 LAB — COMPREHENSIVE METABOLIC PANEL
ALT: 13 IU/L (ref 0–32)
AST: 18 IU/L (ref 0–40)
Albumin/Globulin Ratio: 1.8 (ref 1.2–2.2)
Albumin: 4.4 g/dL (ref 3.6–4.6)
Alkaline Phosphatase: 110 IU/L (ref 44–121)
BUN/Creatinine Ratio: 32 — ABNORMAL HIGH (ref 12–28)
BUN: 49 mg/dL — ABNORMAL HIGH (ref 8–27)
Bilirubin Total: 0.5 mg/dL (ref 0.0–1.2)
CO2: 26 mmol/L (ref 20–29)
Calcium: 10.1 mg/dL (ref 8.7–10.3)
Chloride: 102 mmol/L (ref 96–106)
Creatinine, Ser: 1.51 mg/dL — ABNORMAL HIGH (ref 0.57–1.00)
Globulin, Total: 2.4 g/dL (ref 1.5–4.5)
Glucose: 50 mg/dL — ABNORMAL LOW (ref 70–99)
Potassium: 4.8 mmol/L (ref 3.5–5.2)
Sodium: 143 mmol/L (ref 134–144)
Total Protein: 6.8 g/dL (ref 6.0–8.5)
eGFR: 34 mL/min/{1.73_m2} — ABNORMAL LOW (ref >59)

## 2021-09-30 LAB — HEMOGLOBIN A1C
Est. average glucose Bld gHb Est-mCnc: 183 mg/dL
Hgb A1c MFr Bld: 8 % — ABNORMAL HIGH (ref 4.8–5.6)

## 2021-09-30 LAB — TSH: TSH: 3.25 u[IU]/mL (ref 0.450–4.500)

## 2021-09-30 LAB — LIPID PANEL
Chol/HDL Ratio: 3.5 ratio (ref 0.0–4.4)
Cholesterol, Total: 177 mg/dL (ref 100–199)
HDL: 50 mg/dL (ref >39)
LDL Chol Calc (NIH): 104 mg/dL — ABNORMAL HIGH (ref 0–99)
Triglycerides: 131 mg/dL (ref 0–149)
VLDL Cholesterol Cal: 23 mg/dL (ref 5–40)

## 2021-09-30 LAB — B12 AND FOLATE PANEL
Folate: 20 ng/mL (ref >3.0)
Vitamin B-12: >2000 pg/mL — ABNORMAL HIGH (ref 232–1245)

## 2021-09-30 LAB — VITAMIN D 25 HYDROXY (VIT D DEFICIENCY, FRACTURES): Vit D, 25-Hydroxy: 36.9 ng/mL (ref 30.0–100.0)

## 2021-10-01 NOTE — Progress Notes (Signed)
Blood count normal.  ?Liver function normal.  ?Kidney function abnormal. Stable. ?Thyroid function normal.  ?Cholesterol: LDL little high at 104. Goal < 70.  ?HBA1C: 8.0. Diabetes not at goal. I recommended change Novolin 70/30 to 15 U before breakfast and Supper.  ?Folate normal.  ?B12 high. Please ask if taking B12. She does not need to take as much as she is.  ?Vitamin D normal.

## 2021-10-03 ENCOUNTER — Telehealth: Payer: Self-pay

## 2021-10-03 DIAGNOSIS — Z13820 Encounter for screening for osteoporosis: Secondary | ICD-10-CM | POA: Insufficient documentation

## 2021-10-03 DIAGNOSIS — T466X5A Adverse effect of antihyperlipidemic and antiarteriosclerotic drugs, initial encounter: Secondary | ICD-10-CM | POA: Insufficient documentation

## 2021-10-03 DIAGNOSIS — R5383 Other fatigue: Secondary | ICD-10-CM | POA: Insufficient documentation

## 2021-10-03 DIAGNOSIS — I1310 Hypertensive heart and chronic kidney disease without heart failure, with stage 1 through stage 4 chronic kidney disease, or unspecified chronic kidney disease: Secondary | ICD-10-CM | POA: Insufficient documentation

## 2021-10-03 NOTE — Assessment & Plan Note (Signed)
Well controlled.  ?No changes to medicines.  ?Continue to work on eating a healthy diet and exercise.  ?Labs drawn today.  ?

## 2021-10-03 NOTE — Assessment & Plan Note (Addendum)
Not at goal.  ?Continue zetia.  ?Intolerant to statins.   ?Continue to work on eating a healthy diet and exercise.  ?Labs drawn today.  ?

## 2021-10-03 NOTE — Assessment & Plan Note (Addendum)
Management per specialist. Dr. Tamala Julian.  ?On zetia 10 mg daily, plavix 75 mg daily, Imdur 60 mg daily. Aspirin 81 mg once daily. Carvedilol 6.25 mg one twice daily.  ?

## 2021-10-03 NOTE — Assessment & Plan Note (Signed)
Linzess 75 mg once daily in am.  ?Please eat 3 meals according to education.  ?

## 2021-10-03 NOTE — Telephone Encounter (Signed)
Carol [name] called to report that her phone is not compatible with the continuous glucose monitor.  She also spoke with her insurance company and the analyzer is not covered by her insurance and she is going to continue to do finger stick checks of her blood sugar.   ?

## 2021-10-03 NOTE — Assessment & Plan Note (Addendum)
Control: not at goal. ?Recommend CGM to monitor low sugars and direct insulin therapy.  ?Recommend check feet daily. ?Recommend annual eye exams. ?Medicines:  I recommended change Novolin 70/30 to 15 U before breakfast and Supper.  ?Folate normal. Continue to work on eating a healthy diet and exercise.  ?Labs drawn today.   ? ?

## 2021-10-03 NOTE — Assessment & Plan Note (Signed)
Labs drawn today

## 2021-10-03 NOTE — Assessment & Plan Note (Addendum)
Intolerant to statins. 

## 2021-10-03 NOTE — Assessment & Plan Note (Signed)
Ordered Bone Density 

## 2021-10-05 DIAGNOSIS — E113592 Type 2 diabetes mellitus with proliferative diabetic retinopathy without macular edema, left eye: Secondary | ICD-10-CM | POA: Diagnosis not present

## 2021-10-06 ENCOUNTER — Ambulatory Visit (INDEPENDENT_AMBULATORY_CARE_PROVIDER_SITE_OTHER): Payer: Medicare HMO

## 2021-10-06 DIAGNOSIS — I5042 Chronic combined systolic (congestive) and diastolic (congestive) heart failure: Secondary | ICD-10-CM

## 2021-10-06 DIAGNOSIS — E785 Hyperlipidemia, unspecified: Secondary | ICD-10-CM

## 2021-10-06 DIAGNOSIS — I130 Hypertensive heart and chronic kidney disease with heart failure and stage 1 through stage 4 chronic kidney disease, or unspecified chronic kidney disease: Secondary | ICD-10-CM

## 2021-10-06 DIAGNOSIS — Z794 Long term (current) use of insulin: Secondary | ICD-10-CM

## 2021-10-06 DIAGNOSIS — I25709 Atherosclerosis of coronary artery bypass graft(s), unspecified, with unspecified angina pectoris: Secondary | ICD-10-CM

## 2021-10-06 DIAGNOSIS — N184 Chronic kidney disease, stage 4 (severe): Secondary | ICD-10-CM

## 2021-10-06 NOTE — Patient Instructions (Addendum)
Visit Information ? ?Thank you for taking time to visit with me today. Please don't hesitate to contact me if I can be of assistance to you before our next scheduled telephone appointment.  ?Check blood sugars daily either fasting or 1 1/2 hours after eating with goal of 80-130 fasting and 180 or less after meals ? ?Following are the goals we discussed today:  ?Take all medications as prescribed ?Attend all scheduled provider appointments ?Call pharmacy for medication refills 3-7 days in advance of running out of medications ?Perform all self care activities independently  ?Call provider office for new concerns or questions  ?call office if I gain more than 2 pounds in one day or 5 pounds in one week ?keep legs up while sitting ?track weight in diary ?watch for swelling in feet, ankles and legs every day ?weigh myself daily ?eat more whole grains, fruits and vegetables, lean meats and healthy fats ?keep appointment with eye doctor ?check blood sugar at prescribed times: twice daily and when you have symptoms of low or high blood sugar ?check feet daily for cuts, sores or redness ?enter blood sugar readings and medication or insulin into daily log ?drink 6 to 8 glasses of water each day ?fill half of plate with vegetables ?manage portion size ?keep feet up while sitting ?check blood pressure 3 times per week ?choose a place to take my blood pressure (home, clinic or office, retail store) ?keep a blood pressure log ?call doctor for signs and symptoms of high blood pressure ?limit salt intake to '2300mg'$ /day ?call for medicine refill 2 or 3 days before it runs out ?take all medications exactly as prescribed ?call doctor with any symptoms you believe are related to your medicine ?Living With Diabetes ?Diabetes (type 1 diabetes mellitus or type 2 diabetes mellitus) is a condition in which the body does not have enough of a hormone called insulin, or the body does not respond properly to insulin. Normally, insulin allows  sugars (glucose) to enter cells in the body. With diabetes, extra glucose builds up in the blood instead of going into cells. This results in high blood glucose (hyperglycemia). ?How to manage lifestyle changes ?Managing diabetes includes medical treatments as well as lifestyle changes. If diabetes is not managed well, serious physical and emotional complications can occur. Taking good care of yourself means that you are responsible for: ?Monitoring glucose regularly. ?Eating a healthy diet. ?Exercising regularly. ?Meeting with health care providers. ?Taking medicines as directed. ?Most people feel some stress about managing their diabetes. When this stress becomes too much, it is known as diabetes-related distress. This is very common. Living with diabetes can place you at risk for diabetes distress, depression, or anxiety. These disorders can make diabetes more difficult to manage. ?How to recognize stress ?You may have diabetes distress if you: ?Avoid or ignore your daily diabetes care. This includes glucose testing, following a meal plan, and taking medications. ?Feel overwhelmed by your daily diabetes care. ?Experience emotional reactions such as anger, sadness, or fear related to your daily diabetes care. ?Feel fear or shame about not doing everything perfectly that you have been told to do. ?Emotional distress ?Symptoms of diabetes distress include: ?Anger about having a diagnosis of diabetes. ?Fear or frustration about your diagnosis and the changes you need to make to manage the condition. ?Being overly worried about the care that you need or the cost of the care that you need. ?Feeling like you caused your condition by doing something wrong. ?Fear about unpredictable  fluctuations in your blood glucose, like low or high blood glucose. ?Feeling judged by your health care providers. ?Feeling very alone with the disease. ?Depression ?Having diabetes means that you are at a higher risk for depression. Your  health care provider may test (screen) you for symptoms of depression. It is important to recognize symptoms and to start treatment for depression soon after it is diagnosed. The following are some symptoms of depression: ?Loss of interest in things that you used to enjoy. ?Feeling depressed much or most of the time. ?A change in appetite. ?Trouble getting to sleep or staying asleep. ?Feeling tired most of the day. ?Feeling nervous and anxious. ?Feeling guilty and worrying that you are a burden to others. ?Having thoughts of hurting yourself or feeling that you want to die. ?If you have any of these symptoms, more days than not, for 2 weeks or longer, you may have depression. This would be a good time to contact your health care provider. ?Follow these instructions at home: ?Managing diabetes distress ?The following are some ways to manage emotional distress: ?Learn as much as you can about diabetes and its treatment. Take one step at a time to improve your management. ?Meet with a certified diabetes care and education specialist. Take a class to learn how to manage your condition. ?Consider working with a Social worker or therapist. ?Keep a journal of your thoughts and concerns. ?Accept that some things are out of your control. ?Talk with other people who have diabetes. It can help to talk about the distress that you feel. ?Find ways to manage stress that work for you. These may include art or music therapy, exercise, meditation, and hobbies. ?Seek support from spiritual leaders, family, and friends. ? ?General instructions ?Do your best to follow your diabetes management plan. ?If you are struggling to follow your plan, talk with a certified diabetes care and education specialist, or with someone else who has diabetes. They may have ideas that will help. ?Forgive yourself for not being perfect. Almost everyone struggles with the tasks of diabetes. ?Keep all follow-up visits. This is important. ?Where to find  support ?Search for information and support from the American Diabetes Association: www.diabetes.org ?Find a certified diabetes education and care specialist. Make an appointment through the Association of Diabetes Care & Education Specialists: www.diabeteseducator.org ?Contact a health care provider if: ?You believe your diabetes is getting out of control. ?You are concerned you may be depressed. ?You think your medications are not helping control your diabetes. ?You are feeling overwhelmed with your diabetes. ?Get help right away if: ?You have thoughts about hurting yourself or others. ?If you ever feel like you may hurt yourself or others, or have thoughts about taking your own life, get help right away. You can go to your nearest emergency department or call: ?Your local emergency services (911 in the [place] ?A suicide crisis helpline, such as the [facility] at 812-664-9059 or 988 in the [place]. This is open 24 hours a day. ?Summary ?Diabetes (type 1 diabetes mellitus or type 2 diabetes mellitus) is a condition in which the body does not have enough of a hormone called insulin, or the body does not respond properly to insulin. ?Living with diabetes puts you at risk for medical and emotional issues, such as diabetes distress, depression, and anxiety. ?Recognizing the symptoms of diabetes distress and depression may help you avoid problems with your diabetes control. If you experience symptoms, it is important to discuss this with your health care  provider, certified diabetes care and education specialist, or therapist. ?It is important to start treatment for diabetes distress and depression soon after diagnosis. ?Ask your health care provider to recommend a therapist who understands both depression and diabetes. ?This information is not intended to replace advice given to you by your health care provider. Make sure you discuss any questions you have with your health care  provider. ?Document Revised: 01/04/2021 Document Reviewed: 10/22/2019 ?Elsevier Patient Education ? [place]. ? ?Our next appointment is by telephone on 11/21/21 at 10:45 AM ? ?Please call the care guide team at [phone]

## 2021-10-06 NOTE — Chronic Care Management (AMB) (Signed)
? Care Management ?  ? RN Visit Note ? ?10/06/2021 ?Name: Carol [name] MRN: [id number] DOB: [birth year] ? ?Subjective: ?Carol [name] is a 86 y.o. year old female who is a primary care patient of Cox, Kirsten, MD. The care management team was consulted for assistance with disease management and care coordination needs.   ? ?Engaged with patient by telephone for initial visit in response to provider referral for case management and/or care coordination services.  ? ?Consent to Services:  ? [name] was given information about Care Management services today including:  ?Care Management services includes personalized support from designated clinical staff supervised by her physician, including individualized plan of care and coordination with other care providers ?24/7 contact phone numbers for assistance for urgent and routine care needs. ?The patient may stop case management services at any time by phone call to the office staff. ? ?Patient agreed to services and consent obtained.  ? ?Assessment: Review of patient past medical history, allergies, medications, health status, including review of consultants reports, laboratory and other test data, was performed as part of comprehensive evaluation and provision of chronic care management services.  ? ?SDOH (Social Determinants of Health) assessments and interventions performed:  ?SDOH Interventions   ? ?Flowsheet Row Most Recent Value  ?SDOH Interventions   ?Food Insecurity Interventions Intervention Not Indicated  ?Financial Strain Interventions Intervention Not Indicated  ?Housing Interventions Intervention Not Indicated  ?Physical Activity Interventions Intervention Not Indicated  ?Stress Interventions Intervention Not Indicated  ?Transportation Interventions Intervention Not Indicated  ? ?  ?  ? ?Care Plan ? ?Allergies  ?Allergen Reactions  ? Contrast Media [Iodinated Contrast Media] Other (See Comments)  ?  unknown  ? Cortizone-10 [Hydrocortisone] Other (See  Comments)  ?  unknown  ? Lipitor [Atorvastatin]   ?  Muscle aches- severe  ? Zocor [Simvastatin]   ?  Muscle aches- severe  ? ? ?Outpatient Encounter Medications as of 10/06/2021  ?Medication Sig  ? albuterol (VENTOLIN HFA) 108 (90 Base) MCG/ACT inhaler Inhale into the lungs.  ? Alcohol Swabs (ALCOHOL WIPES) 70 % PADS USE ONE PAD TO CLEAN AREA TO CHECK BLOOD SUGAR AS  DIRECTED  ? allopurinol (ZYLOPRIM) 300 MG tablet Take 300 mg by mouth every morning.   ? aspirin EC 81 MG tablet Take 1 tablet (81 mg total) by mouth daily.  ? Biotin 5000 MCG CAPS Take 5,000 mcg by mouth every morning.   ? Blood Glucose Monitoring Suppl (TRUE METRIX METER) DEVI Use to test blood sugars once daily. ICD: E11.65  ? carvedilol (COREG) 6.25 MG tablet Take 6.25 mg by mouth 2 (two) times daily with a meal.  ? clopidogrel (PLAVIX) 75 MG tablet Take 1 tablet (75 mg total) by mouth daily.  ? colchicine 0.6 MG tablet Take 0.6 mg by mouth daily as needed (gout flare up.).   ? Continuous Blood Gluc Receiver (FREESTYLE LIBRE READER) DEVI 1 each by Does not apply route as directed.  ? ezetimibe (ZETIA) 10 MG tablet Take 10 mg by mouth daily.  ? famotidine (PEPCID) 10 MG tablet Take 10 mg by mouth 2 (two) times daily as needed for heartburn or indigestion.   ? fluticasone (FLONASE) 50 MCG/ACT nasal spray USE 1 SPRAY NASALLY EVERY DAY  ? furosemide (LASIX) 80 MG tablet Take 80 mg by mouth daily.  ? gabapentin (NEURONTIN) 300 MG capsule Take 300 mg by mouth 2 (two) times daily.  ? insulin NPH-regular Human (NOVOLIN 70/30) (70-30) 100 UNIT/ML injection Inject 25-32  Units into the skin daily.  ? isosorbide mononitrate (IMDUR) 60 MG 24 hr tablet Take 1 tablet (60 mg total) by mouth daily.  ? levothyroxine (SYNTHROID, LEVOTHROID) 50 MCG tablet Take 50 mcg by mouth every morning.   ? linaclotide (LINZESS) 72 MCG capsule Take 1 capsule (72 mcg total) by mouth daily before breakfast.  ? methocarbamol (ROBAXIN) 500 MG tablet Take 1-2 tablets (500-1,000 mg  total) by mouth every 6 (six) hours as needed for muscle spasms.  ? nitroGLYCERIN (NITROSTAT) 0.4 MG SL tablet Place 0.4 mg under the tongue as needed.  ? ONE TOUCH ULTRA TEST test strip 1 each by Other route as needed for other.   ? pantoprazole (PROTONIX) 40 MG tablet TAKE 1 TABLET BY MOUTH ONCE DAILY (Patient taking differently: Take 40 mg by mouth daily. )  ? potassium chloride (KLOR-CON) 10 MEQ tablet Take 10 mEq by mouth daily.  ? TRADJENTA 5 MG TABS tablet Take 5 mg by mouth daily.  ? TRUEplus Lancets 33G MISC Use to test blood sugar once daily. ICD: E11.65  ? ?No facility-administered encounter medications on file as of 10/06/2021.  ? ? ?Patient Active Problem List  ? Diagnosis Date Noted  ? Osteoporosis screening 10/03/2021  ? Myalgia due to statin 10/03/2021  ? Hypertensive heart and renal disease 10/03/2021  ? Other fatigue 10/03/2021  ? Chronic idiopathic constipation 09/28/2021  ? S/P total knee replacement 04/21/2018  ? Chronic combined systolic and diastolic heart failure ([place]) 04/19/2015  ? GERD (gastroesophageal reflux disease) 04/19/2015  ? Spinal stenosis, lumbar region, with neurogenic claudication 12/22/2014  ? Diabetes ([place]) 04/10/2014  ? Asthma, moderate persistent 03/23/2014  ? Post-infarction angina ([place]) 02/17/2014  ? Abnormal nuclear stress test: Intermediate risk 02/17/2014  ? Presence of drug coated stent in left circumflex coronary artery: OM2 - Xience Alpine DES 2.25 mm x 12, 2.25 mm x 8 mm overlap 02/17/2014  ?  Class: Acute  ? Ischemic cardiomyopathy - with near resolution of LVEF post MI 02/15/2014  ? CKD stage 3 due to type 2 diabetes mellitus ([place]) 02/15/2014  ? Burning chest pain 01/22/2014  ? Hyperlipidemia with target LDL less than 70 01/22/2014  ? History of non-ST elevation myocardial infarction (NSTEMI) 01/22/2014  ? Coronary artery disease involving coronary bypass graft of native heart with angina pectoris ([place]) 01/22/2014  ? 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 Essential hypertension 12/05/2012  ? Unspecified hypothyroidism 12/05/2012  ? Lower back pain 12/05/2012  ? Gout, unspecified   ? ? ?Conditions to be addressed/monitored: CHF, CAD, HTN, HLD, and DMII ? ?Care Plan : RN Care Manager Plan of Care  ?Updates made by Dimitri Ped, RN since 10/06/2021 12:00 AM  ?  ? ?Problem: Disease Management and Care Coordination Needs(DM, CAD, CHF, HTN, HLD)   ?Priority: High  ?  ? ?Long-Range Goal: Establish Plan of Care for Care Coordination and Disease Management Needs(DM, CAD, CHF, HTN, HLD)   ?Start Date: 10/06/2021  ?Expected End Date: 10/06/2022  ?Priority: High  ?Note:   ?Current Barriers:  ?Knowledge Deficits related to plan of care for management of CHF, CAD, HTN, HLD, and DMII  ?Chronic Disease Management support and education needs related to CHF, CAD, HTN, HLD, and DMII  ?States she wants to get her diabetes under better control.  States she has not been eating as healthy as she should and eats a lot of crackers.  States this last week she has started eating more vegetables.  States  that while she was getting unpacked from her move she was not looking after herself as she should.  States that she checks her CBG 1-2 times a day and it was 117 this morning and has ranged from 117-200 this last week.  States she was not able to use the [place] because her phone was not compatible and was told the reader is very expensive. Denies any chest pains, increased swelling or shortness of breath.  States she has not been weighing daily or checking her B/P at home recently. ? ?RNCM Clinical Goal(s):  ?Patient will verbalize understanding of plan for management of CHF, CAD, HTN, HLD, and DMII as evidenced by voiced adherence to plan of care ?verbalize basic understanding of  CHF, CAD, HTN, HLD, and DMII disease process and self health management plan as evidenced by voiced understanding and teach back  ?take all medications exactly as prescribed and will call provider for  medication related questions as evidenced by dispense report and pt verbalization  ?attend all scheduled medical appointments: Dr. Tobie Poet 11/09/21, cardiology 01/11/22 as evidenced by medical records ?demonstr

## 2021-10-08 ENCOUNTER — Encounter: Payer: Self-pay | Admitting: Family Medicine

## 2021-10-09 ENCOUNTER — Telehealth: Payer: Self-pay

## 2021-10-09 NOTE — Telephone Encounter (Signed)
Patient will reach out to insurance.  ? ?Carol [name] 10/09/21 5:13 PM ? ?

## 2021-10-09 NOTE — Telephone Encounter (Signed)
Patient is unable to use freestyle libre due to phone not being compatible. A reader was sent previously but she was told this was very expensive. She would like to be able to use a CGM but is unsure of the next step. Patient has been checking finger sticks 2-3 times a day and does not like doing this. She requesting a message be sent to provider.  ? ?Harrell Lark 10/09/21 2:21 PM ? ?

## 2021-10-11 ENCOUNTER — Telehealth: Payer: Self-pay

## 2021-10-11 NOTE — Telephone Encounter (Signed)
Patient called stated that for the starter kit for libre freestyle it will cost $136.42 and every 3 months it willl cost $102.52. patient states she was to call back to let you know.  ?

## 2021-10-12 ENCOUNTER — Other Ambulatory Visit: Payer: Self-pay | Admitting: Family Medicine

## 2021-10-12 NOTE — Telephone Encounter (Signed)
Attempted patient and left VM for return call.  ? ?Harrell Lark 10/12/21 2:30 PM ? ?

## 2021-10-22 DIAGNOSIS — E1122 Type 2 diabetes mellitus with diabetic chronic kidney disease: Secondary | ICD-10-CM | POA: Diagnosis not present

## 2021-10-22 DIAGNOSIS — I25709 Atherosclerosis of coronary artery bypass graft(s), unspecified, with unspecified angina pectoris: Secondary | ICD-10-CM | POA: Diagnosis not present

## 2021-10-22 DIAGNOSIS — N184 Chronic kidney disease, stage 4 (severe): Secondary | ICD-10-CM

## 2021-10-22 DIAGNOSIS — I130 Hypertensive heart and chronic kidney disease with heart failure and stage 1 through stage 4 chronic kidney disease, or unspecified chronic kidney disease: Secondary | ICD-10-CM

## 2021-10-22 DIAGNOSIS — E785 Hyperlipidemia, unspecified: Secondary | ICD-10-CM | POA: Diagnosis not present

## 2021-10-22 DIAGNOSIS — Z794 Long term (current) use of insulin: Secondary | ICD-10-CM | POA: Diagnosis not present

## 2021-10-24 ENCOUNTER — Other Ambulatory Visit: Payer: Self-pay

## 2021-10-26 ENCOUNTER — Other Ambulatory Visit: Payer: Self-pay

## 2021-10-26 MED ORDER — TRUE METRIX BLOOD GLUCOSE TEST VI STRP: ORAL_STRIP | 12 refills | Status: AC

## 2021-10-30 ENCOUNTER — Encounter: Payer: Self-pay | Admitting: Family Medicine

## 2021-11-02 DIAGNOSIS — E113592 Type 2 diabetes mellitus with proliferative diabetic retinopathy without macular edema, left eye: Secondary | ICD-10-CM | POA: Diagnosis not present

## 2021-11-02 LAB — HM DIABETES EYE EXAM

## 2021-11-09 ENCOUNTER — Ambulatory Visit (INDEPENDENT_AMBULATORY_CARE_PROVIDER_SITE_OTHER): Payer: Medicare HMO | Admitting: Family Medicine

## 2021-11-09 VITALS — BP 146/60 | HR 68 | Temp 96.2°F | Resp 16 | Ht 67.0 in | Wt 200.0 lb

## 2021-11-09 DIAGNOSIS — M791 Myalgia, unspecified site: Secondary | ICD-10-CM

## 2021-11-09 DIAGNOSIS — N184 Chronic kidney disease, stage 4 (severe): Secondary | ICD-10-CM

## 2021-11-09 DIAGNOSIS — E1122 Type 2 diabetes mellitus with diabetic chronic kidney disease: Secondary | ICD-10-CM

## 2021-11-09 DIAGNOSIS — R42 Dizziness and giddiness: Secondary | ICD-10-CM

## 2021-11-09 DIAGNOSIS — Z794 Long term (current) use of insulin: Secondary | ICD-10-CM

## 2021-11-09 DIAGNOSIS — K5904 Chronic idiopathic constipation: Secondary | ICD-10-CM

## 2021-11-09 NOTE — Patient Instructions (Addendum)
Recommend taking evening insulin prior to supper based on sugar before your meal.  For constipation: Start MiraLAX twice daily.  Take Linzess 72 mg once daily if needed.  Dizziness: move slowly when changing positions. Checking labs.   [facility]: [phone] Call to check on Free Style [place] order

## 2021-11-09 NOTE — Progress Notes (Signed)
Subjective:  Patient ID: Carol [name], female    DOB: 1935/09/13  Age: 86 y.o. MRN: [id number]  Chief Complaint  Patient presents with   Diabetes   Constipation    6 week follow up   HPI:  CORONARY ARTERY DISEASE: Stents. 3 heart attacks. CABG. M[facility]G: Dr. Tamala Julian. On zetia 10 mg daily, plavix 75 mg daily, Imdur 60 mg daily. Aspirin 81 mg once daily. Carvedilol 6.25 mg one twice daily. Patient is scheduled to see Dr. Tamala Julian   B12 deficiency: Previously took b12 shots.  Taking oral B12. B12 level was good. .  Diabetes: Diabetes not at goal. I recommended change Novolin 70/30 to 15 U before breakfast and Supper. Patient did not tolerate this dose due to sugars being too high. Changed to 70/30 20 U in am and if high in the evening she takes 12 U. Rarely having to take. If over 180 will take 12 U.  Sugars in am 74-180.  Constipation(6 week follow up): Took linzess 72 mg once daily in am. Very expensive.  Patient would like to try the miralax twice a day.    Complaining of dizziness, weakness, and leg cramps. Light headed. No chest pain. NO palpitations.    Current Outpatient Medications on File Prior to Visit  Medication Sig Dispense Refill   albuterol (VENTOLIN HFA) 108 (90 Base) MCG/ACT inhaler Inhale into the lungs.     Alcohol Swabs (ALCOHOL WIPES) 70 % PADS USE ONE PAD TO CLEAN AREA TO CHECK BLOOD SUGAR AS  DIRECTED     allopurinol (ZYLOPRIM) 300 MG tablet Take 300 mg by mouth every morning.      aspirin EC 81 MG tablet Take 1 tablet (81 mg total) by mouth daily. 90 tablet 3   Biotin 5000 MCG CAPS Take 5,000 mcg by mouth every morning.      Blood Glucose Monitoring Suppl (TRUE METRIX METER) DEVI Use to test blood sugars once daily. ICD: E11.65     carvedilol (COREG) 6.25 MG tablet Take 6.25 mg by mouth 2 (two) times daily with a meal.     clopidogrel (PLAVIX) 75 MG tablet Take 1 tablet (75 mg total) by mouth daily. 90 tablet 0   colchicine 0.6 MG tablet Take 0.6 mg by mouth daily  as needed (gout flare up.).      ezetimibe (ZETIA) 10 MG tablet Take 10 mg by mouth daily.     famotidine (PEPCID) 10 MG tablet Take 10 mg by mouth 2 (two) times daily as needed for heartburn or indigestion.      fluticasone (FLONASE) 50 MCG/ACT nasal spray USE 1 SPRAY NASALLY EVERY DAY     furosemide (LASIX) 80 MG tablet Take 80 mg by mouth daily.     gabapentin (NEURONTIN) 300 MG capsule Take 300 mg by mouth 2 (two) times daily.     glucose blood (TRUE METRIX BLOOD GLUCOSE TEST) test strip Use as instructed 100 each 12   insulin NPH-regular Human (NOVOLIN 70/30) (70-30) 100 UNIT/ML injection Inject 25-32 Units into the skin daily.     isosorbide mononitrate (IMDUR) 60 MG 24 hr tablet Take 1 tablet (60 mg total) by mouth daily. 90 tablet 3   levothyroxine (SYNTHROID, LEVOTHROID) 50 MCG tablet Take 50 mcg by mouth every morning.      linaclotide (LINZESS) 72 MCG capsule Take 1 capsule (72 mcg total) by mouth daily before breakfast. 30 capsule 2   methocarbamol (ROBAXIN) 500 MG tablet Take 1-2 tablets (500-1,000 mg total) by  mouth every 6 (six) hours as needed for muscle spasms. 60 tablet 0   nitroGLYCERIN (NITROSTAT) 0.4 MG SL tablet Place 0.4 mg under the tongue as needed.     pantoprazole (PROTONIX) 40 MG tablet TAKE 1 TABLET BY MOUTH ONCE DAILY (Patient taking differently: Take 40 mg by mouth daily.) 30 tablet 10   potassium chloride (KLOR-CON) 10 MEQ tablet Take 10 mEq by mouth daily.     TRADJENTA 5 MG TABS tablet Take 5 mg by mouth daily.     TRUEplus Lancets 33G MISC Use to test blood sugar once daily. ICD: E11.65     Continuous Blood Gluc Receiver (FREESTYLE LIBRE READER) DEVI 1 each by Does not apply route as directed. (Patient not taking: Reported on 11/09/2021) 1 each 0   No current facility-administered medications on file prior to visit.   Past Medical History:  Diagnosis Date   Anemia    a. Noted on labs 02/2014 possibly procedurally related.   Asthma    CAD S/P percutaneous  coronary angioplasty 01/22/2014   A. (12/2013): POBA - OM2 99% (very tortuous segment) - reduced ~ 50% & TIMI 3 flow, 80-90% stenosis in mid PDA, Irregularities <50% in mRCA, pLAD and prox LCx; b. 02/17/14: Moderate sized fixed defect in Lateral wall --> cath with OM2 restenosis & otherwise stable --> Xience Alpine DES x 2 (2.25 mm x 12 & 8 mm). c. NSTEMI 02/2014 s/p PTCA/balloon angioplasty only to small caliber PDA.   Carotid stenosis    Carotid US 11/17: L 1-39; FU prn   Chronic systolic heart failure ([place])    a. ICM b. ECHO (01/2014): EF 25-30%, grade I DD, trivial MR. c. EF by Myoview 02/17/14 = ~53%. d. EF by cath 02/24/14 - improved to 50-55%.   CKD (chronic kidney disease), stage III (HCC)    Contrast media allergy    Environmental allergies    GERD (gastroesophageal reflux disease)    Gout, unspecified    HOH (hard of hearing)    Hyperlipidemia    a. H/o intolerance to Zocor, Lipitor. Had muscle aches on higher dose Crestor.   Hypertension    Hypothyroidism    LBBB (left bundle branch block)    a. Transient during 02/2014 admission.   NSTEMI (non-ST elevated myocardial infarction) ([place]) 2014; 12/2013, 01/2014   QT prolongation    a. Noted on EKG 02/2014.   Sinus bradycardia    a. HR 40s-50s during 02/2014 admission, limiting BB dose.   TIA (transient ischemic attack) 1990's   a. TIA vs stroke 1990's "mild" - sounds like a TIA as she was told she had stroke symptoms but negative scans. Denies residual effects.   Type II diabetes mellitus [facility])    Past Surgical History:  Procedure Laterality Date   ABDOMINAL HYSTERECTOMY     complete   AMPUTATION TOE Right 02/25/2021   hammer toe.   [place] CATHETERIZATION  02/24/2014   Procedure: CORONARY BALLOON ANGIOPLASTY;  Surgeon: Burnell Blanks, MD;  Location: [facility] CATH LAB;  Service: Cardiovascular;;   CARDIAC CATHETERIZATION N/A 04/20/2015   Procedure: Left Heart Cath and Coronary Angiography;  Surgeon: Belva Crome, MD;  Location: [place] CV LAB;  Service: Cardiovascular;  Laterality: N/A;   CATARACT EXTRACTION W/ INTRAOCULAR LENS  IMPLANT, BILATERAL Bilateral 2013   CESAREAN SECTION  1959; [place]  12/2013   POBA - OM2   CORONARY ARTERY BYPASS  GRAFT N/A 04/25/2015   Procedure: CORONARY ARTERY BYPASS GRAFTING (CABG) x four,  using left internal mammary artery and right leg greater saphenous vein harvested endoscopically;  Surgeon: Gaye Pollack, MD;  Location: [place] OR;  Service: Open Heart Surgery;  Laterality: N/A;   JOINT REPLACEMENT Right 10 years ago    right knee (TKR)   LEFT HEART CATHETERIZATION WITH CORONARY ANGIOGRAM N/A 01/22/2014   Procedure: LEFT HEART CATHETERIZATION WITH CORONARY ANGIOGRAM;  Surgeon: Sinclair Grooms, MD;  Location: [facility] CATH LAB;  Service: Cardiovascular;  Laterality: N/A;   LEFT HEART CATHETERIZATION WITH CORONARY ANGIOGRAM N/A 02/17/2014   Procedure: LEFT HEART CATHETERIZATION WITH CORONARY ANGIOGRAM;  Surgeon: Burnell Blanks, MD;  Location: [facility] CATH LAB;  Service: Cardiovascular;  Laterality: N/A;   LEFT HEART CATHETERIZATION WITH CORONARY ANGIOGRAM N/A 02/24/2014   Procedure: LEFT HEART CATHETERIZATION WITH CORONARY ANGIOGRAM;  Surgeon: Burnell Blanks, MD;  Location: [facility] CATH LAB;  Service: Cardiovascular;  Laterality: N/A;   LUMBAR LAMINECTOMY/DECOMPRESSION MICRODISCECTOMY Left 12/22/2014   Procedure: CENTRAL DECOMPRESSIVE LUMBAR, AND LAMINECTOMY L5-S1,  S1-S2 FOR SPINAL STENOSIS FORAMINOTOMY L5, S1, S2 ROOTS ON LEFT FOR FORAMINAL STENOSIS, AND DECOMPRESSION L5-S1, S1-S2 ACCORDING TO LABELING OF [place] X-RAYS;  Surgeon: Latanya Maudlin, MD;  Location: [facility] ORS;  Service: Orthopedics;  Laterality: Left;   PERCUTANEOUS CORONARY STENT INTERVENTION (PCI-S)  02/17/2014   For restenosis of OM2 - PCI with Xience Apline DES 2.25 mm x 12 mm & 2.25 mm x 8 mm overlapping   REDUCTION MAMMAPLASTY Bilateral  1990's   SHOULDER OPEN ROTATOR CUFF REPAIR Bilateral 1980's - 1990's   TEE WITHOUT CARDIOVERSION N/A 04/25/2015   Procedure: TRANSESOPHAGEAL ECHOCARDIOGRAM (TEE);  Surgeon: Gaye Pollack, MD;  Location: [place];  Service: Open Heart Surgery;  Laterality: N/A;   TOTAL KNEE ARTHROPLASTY Right 2009   TOTAL KNEE ARTHROPLASTY Left 04/21/2018   TKR;  Surgeon: Vickey Huger, MD;  Location: [facility] ORS;  Service: Orthopedics;  Laterality: Left;  Adductor Block    Family History  Problem Relation Age of Onset   Hypertension Mother    Diabetes Mother    Heart attack Mother 58   Heart disease Father    Emphysema Father    Diabetes Brother    Heart disease Brother    Cancer Sister    Stroke Neg Hx    Social History   Socioeconomic History   Marital status: Widowed    Spouse name: Not on file   Number of children: 2   Years of education: Not on file   Highest education level: Not on file  Occupational History   Occupation: Retired  Tobacco Use   Smoking status: Never   Smokeless tobacco: Never  Vaping Use   Vaping Use: Never used  Substance and Sexual Activity   Alcohol use: No   Drug use: No   Sexual activity: Never  Other Topics Concern   Not on file  Social History Narrative   Lives in [place] by herself.    Social Determinants of Health   Financial Resource Strain: Low Risk    Difficulty of Paying Living Expenses: Not hard at all  Food Insecurity: No Food Insecurity   Worried About Charity fundraiser in the Last Year: Never true   [place] in the Last Year: Never true  Transportation Needs: No Transportation Needs   Lack of Transportation (Medical): No   Lack of Transportation (Non-Medical): No  Physical Activity: Sufficiently Active  Days of Exercise per Week: 6 days   Minutes of Exercise per Session: 40 min  Stress: No Stress Concern Present   Feeling of Stress : Not at all  Social Connections: Not on file    Review of Systems  Constitutional:  Negative for  appetite change, fatigue and fever.  HENT:  Negative for congestion, ear pain, sinus pressure and sore throat.   Respiratory:  Positive for shortness of breath. Negative for cough and wheezing.   Cardiovascular:  Negative for chest pain and palpitations.  Gastrointestinal:  Positive for constipation (controlled with medication). Negative for abdominal pain, diarrhea, nausea and vomiting.  Genitourinary:  Negative for dysuria and hematuria.  Musculoskeletal:  Positive for arthralgias, back pain and myalgias. Negative for joint swelling.  Skin:  Negative for rash.  Neurological:  Positive for dizziness. Negative for weakness and headaches.  Psychiatric/Behavioral:  Negative for dysphoric mood. The patient is not nervous/anxious.     Objective:  BP (!) 146/60   Pulse 68   Temp (!) 96.2 F (35.7 C)   Resp 16   Ht '5\' 7"'$  (1.702 m)   Wt 200 lb (90.7 kg)   BMI 31.32 kg/m      11/09/2021   10:30 AM 09/28/2021    2:26 PM 05/13/2021    3:30 PM  BP/Weight  Systolic BP 381 017 510  Diastolic BP 60 60 63  Wt. (Lbs) 200 200.8   BMI 31.32 kg/m2 33.68 kg/m2    Patient became orthostatic when going from sitting to standing.  I am unable to pull this data into my note.  Physical Exam Vitals reviewed.  Constitutional:      Appearance: Normal appearance. She is normal weight.  Cardiovascular:     Rate and Rhythm: Normal rate and regular rhythm.     Pulses: Normal pulses.     Heart sounds: Normal heart sounds.  Pulmonary:     Effort: Pulmonary effort is normal.     Breath sounds: Normal breath sounds.  Abdominal:     General: Abdomen is flat. Bowel sounds are normal.     Palpations: Abdomen is soft.  Neurological:     Mental Status: She is alert and oriented to person, place, and time.  Psychiatric:        Mood and Affect: Mood normal.        Behavior: Behavior normal.    Diabetic Foot Exam - Simple   No data filed      Lab Results  Component Value Date   WBC 7.1 11/09/2021    HGB 11.6 11/09/2021   HCT 35.1 11/09/2021   PLT 201 11/09/2021   GLUCOSE 136 (H) 11/09/2021   CHOL 177 09/29/2021   TRIG 131 09/29/2021   HDL 50 09/29/2021   LDLCALC 104 (H) 09/29/2021   ALT 15 11/09/2021   AST 22 11/09/2021   NA 143 11/09/2021   K 4.5 11/09/2021   CL 102 11/09/2021   CREATININE 1.63 (H) 11/09/2021   BUN 42 (H) 11/09/2021   CO2 26 11/09/2021   TSH 3.250 09/29/2021   INR 0.94 04/21/2018   HGBA1C 8.0 (H) 09/29/2021   MICROALBUR 2.1 (H) 07/26/2014      Assessment & Plan:   Problem List Items Addressed This Visit       Digestive   Chronic idiopathic constipation - Primary    Start MiraLAX twice daily.  Take Linzess 72 mg once daily if needed.        Endocrine   Diabetes (  [place])    Recommend taking evening insulin prior to supper based on sugar before your meal. Recommend to call to check on Free Style Libre order        Other   Dizziness    move slowly when changing positions. Checking labs.  EKG: Normal sinus rhythm no ST changes      Relevant Orders   Comprehensive metabolic panel (Completed)   CBC with Differential (Completed)   EKG 12-Lead   Myalgia    Checking labs.       Relevant Orders   Magnesium (Completed)   CK (Completed)   Phosphorus (Completed)  .  No orders of the defined types were placed in this encounter.   Orders Placed This Encounter  Procedures   Comprehensive metabolic panel   Magnesium   CK   CBC with Differential   Phosphorus   EKG 12-Lead     Follow-up: Return in about 8 weeks (around 01/01/2022) for chronic fasting, awv with KIM.Marland Kitchen  An After Visit Summary was printed and given to the patient.  I,Lauren M Auman,acting as a scribe for Rochel Brome, MD.,have documented all relevant documentation on the behalf of Rochel Brome, MD,as directed by  Rochel Brome, MD while in the presence of Rochel Brome, MD.    Rochel Brome, MD [place] [phone]

## 2021-11-10 DIAGNOSIS — R42 Dizziness and giddiness: Secondary | ICD-10-CM | POA: Insufficient documentation

## 2021-11-10 DIAGNOSIS — M791 Myalgia, unspecified site: Secondary | ICD-10-CM | POA: Insufficient documentation

## 2021-11-10 LAB — COMPREHENSIVE METABOLIC PANEL
ALT: 15 IU/L (ref 0–32)
AST: 22 IU/L (ref 0–40)
Albumin/Globulin Ratio: 1.6 (ref 1.2–2.2)
Albumin: 4.2 g/dL (ref 3.6–4.6)
Alkaline Phosphatase: 110 IU/L (ref 44–121)
BUN/Creatinine Ratio: 26 (ref 12–28)
BUN: 42 mg/dL — ABNORMAL HIGH (ref 8–27)
Bilirubin Total: 0.4 mg/dL (ref 0.0–1.2)
CO2: 26 mmol/L (ref 20–29)
Calcium: 9.6 mg/dL (ref 8.7–10.3)
Chloride: 102 mmol/L (ref 96–106)
Creatinine, Ser: 1.63 mg/dL — ABNORMAL HIGH (ref 0.57–1.00)
Globulin, Total: 2.6 g/dL (ref 1.5–4.5)
Glucose: 136 mg/dL — ABNORMAL HIGH (ref 70–99)
Potassium: 4.5 mmol/L (ref 3.5–5.2)
Sodium: 143 mmol/L (ref 134–144)
Total Protein: 6.8 g/dL (ref 6.0–8.5)
eGFR: 31 mL/min/{1.73_m2} — ABNORMAL LOW (ref >59)

## 2021-11-10 LAB — CBC WITH DIFFERENTIAL/PLATELET
Basophils Absolute: 0 10*3/uL (ref 0.0–0.2)
Basos: 0 %
EOS (ABSOLUTE): 0 10*3/uL (ref 0.0–0.4)
Eos: 1 %
Hematocrit: 35.1 % (ref 34.0–46.6)
Hemoglobin: 11.6 g/dL (ref 11.1–15.9)
Immature Grans (Abs): 0 10*3/uL (ref 0.0–0.1)
Immature Granulocytes: 0 %
Lymphocytes Absolute: 1.6 10*3/uL (ref 0.7–3.1)
Lymphs: 22 %
MCH: 30.9 pg (ref 26.6–33.0)
MCHC: 33 g/dL (ref 31.5–35.7)
MCV: 93 fL (ref 79–97)
Monocytes Absolute: 0.6 10*3/uL (ref 0.1–0.9)
Monocytes: 8 %
Neutrophils Absolute: 4.8 10*3/uL (ref 1.4–7.0)
Neutrophils: 69 %
Platelets: 201 10*3/uL (ref 150–450)
RBC: 3.76 x10E6/uL — ABNORMAL LOW (ref 3.77–5.28)
RDW: 14.3 % (ref 11.7–15.4)
WBC: 7.1 10*3/uL (ref 3.4–10.8)

## 2021-11-10 LAB — PHOSPHORUS: Phosphorus: 4.3 mg/dL (ref 3.0–4.3)

## 2021-11-10 LAB — CK: Total CK: 120 U/L (ref 26–161)

## 2021-11-10 LAB — MAGNESIUM: Magnesium: 2.4 mg/dL — ABNORMAL HIGH (ref 1.6–2.3)

## 2021-11-10 NOTE — Assessment & Plan Note (Signed)
Start MiraLAX twice daily.  Take Linzess 72 mg once daily if needed.

## 2021-11-10 NOTE — Assessment & Plan Note (Addendum)
move slowly when changing positions. Checking labs.  EKG: Normal sinus rhythm no ST changes

## 2021-11-10 NOTE — Assessment & Plan Note (Signed)
Checking labs

## 2021-11-10 NOTE — Assessment & Plan Note (Addendum)
Recommend taking evening insulin prior to supper based on sugar before your meal. Recommend to call to check on Free Style Libre order

## 2021-11-15 DIAGNOSIS — E1122 Type 2 diabetes mellitus with diabetic chronic kidney disease: Secondary | ICD-10-CM | POA: Diagnosis not present

## 2021-11-20 ENCOUNTER — Encounter: Payer: Self-pay | Admitting: Family Medicine

## 2021-11-21 ENCOUNTER — Ambulatory Visit: Payer: Medicare HMO

## 2021-11-21 DIAGNOSIS — I5042 Chronic combined systolic (congestive) and diastolic (congestive) heart failure: Secondary | ICD-10-CM

## 2021-11-21 DIAGNOSIS — E1122 Type 2 diabetes mellitus with diabetic chronic kidney disease: Secondary | ICD-10-CM

## 2021-11-21 NOTE — Patient Instructions (Signed)
Visit Information  Thank you for taking time to visit with me today. Please don't hesitate to contact me if I can be of assistance to you before our next scheduled telephone appointment.  Following are the goals we discussed today:  Patient Goals/Self-Care Activities: Take all medications as prescribed Attend all scheduled provider appointments Call pharmacy for medication refills 3-7 days in advance of running out of medications Perform all self care activities independently  Call provider office for new concerns or questions  Continue to weight daily and begin to record your weights and take with you to provider visits call office if I gain more than 2 pounds in one day or 5 pounds in one week watch for swelling in feet, ankles and legs every day weigh myself daily follow rescue plan if symptoms flare-up eat more whole grains, fruits and vegetables, lean meats and healthy fats keep appointment with eye doctor check blood sugar at prescribed times: three times daily and when you have symptoms of low or high blood sugar check feet daily for cuts, sores or redness enter blood sugar readings and medication or insulin into daily log drink 6 to 8 glasses of water each day fill half of plate with vegetables manage portion size check blood pressure 3 times per week choose a place to take my blood pressure (home, clinic or office, retail store) write blood pressure results in a log or diary limit salt intake to 2362m/day Continue to be as active as possible per provider recommendation  Our next appointment is by telephone on 12/21/21 at 1:00 pm  Please call the care guide team at [phone] you need to cancel or reschedule your appointment.   If you are experiencing a Mental Health or [place] need someone to talk to, please call the Suicide and Crisis Lifeline: 988 call 1-800-273-TALK (toll free, 24 hour hotline)   Patient verbalizes understanding of  instructions and care plan provided today and agrees to view in [place] [facility] [place] status and patient understanding of how to access instructions and care plan via MyChart confirmed with patient.     The patient has been provided with contact information for the care management team and has been advised to call with any health related questions or concerns.   JThea Silversmith RN, MSN, BSN, CCM Care Management Coordinator [phone]  Hypoglycemia Hypoglycemia is when the sugar (glucose) level in your blood is too low. Low blood sugar can happen to people who have diabetes and people who do not have diabetes. Low blood sugar can happen quickly, and it can be an emergency. What are the causes? This condition happens most often in people who have diabetes. It may be caused by: Diabetes medicine. Not eating enough, or not eating often enough. Doing more physical activity. Drinking alcohol on an empty stomach. If you do not have diabetes, this condition may be caused by: A tumor in the pancreas. Not eating enough, or not eating for long periods at a time (fasting). A very bad infection or illness. Problems after having weight loss (bariatric) surgery. Kidney failure or liver failure. Certain medicines. What increases the risk? This condition is more likely to develop in people who: Have diabetes and take medicines to lower their blood sugar. Abuse alcohol. Have a very bad illness. What are the signs or symptoms? Mild Hunger. Sweating and feeling clammy. Feeling dizzy or light-headed. Being sleepy or having trouble sleeping. Feeling like you may vomit (nauseous). A fast heartbeat. A headache.  Blurry vision. Mood changes, such as: Being grouchy. Feeling worried or nervous (anxious). Tingling or loss of feeling (numbness) around your mouth, lips, or tongue. Moderate Confusion and poor judgment. Behavior changes. Weakness. Uneven heartbeat. Trouble with moving  (coordination). Very low Very low blood sugar (severe hypoglycemia) is a medical emergency. It can cause: Fainting. Seizures. Loss of consciousness (coma). Death. How is this treated? Treating low blood sugar Low blood sugar is often treated by eating or drinking something that has sugar in it right away. The food or drink should contain 15 grams of a fast-acting carb (carbohydrate). Options include: 4 oz (120 mL) of fruit juice. 4 oz (120 mL) of regular soda (not diet soda). A few pieces of hard candy. Check food labels to see how many pieces to eat for 15 grams. 1 Tbsp (15 mL) of sugar or honey. 4 glucose tablets. 1 tube of glucose gel. Treating low blood sugar if you have diabetes If you can think clearly and swallow safely, follow the 15:15 rule: Take 15 grams of a fast-acting carb. Talk with your doctor about how much you should take. Always keep a source of fast-acting carb with you, such as: Glucose tablets (take 4 tablets). A few pieces of hard candy. Check food labels to see how many pieces to eat for 15 grams. 4 oz (120 mL) of fruit juice. 4 oz (120 mL) of regular soda (not diet soda). 1 Tbsp (15 mL) of honey or sugar. 1 tube of glucose gel. Check your blood sugar 15 minutes after you take the carb. If your blood sugar is still at or below 70 mg/dL (3.9 mmol/L), take 15 grams of a carb again. If your blood sugar does not go above 70 mg/dL (3.9 mmol/L) after 3 tries, get help right away. After your blood sugar goes back to normal, eat a meal or a snack within 1 hour.  Treating very low blood sugar If your blood sugar is below 54 mg/dL (3 mmol/L), you have very low blood sugar, or severe hypoglycemia. This is an emergency. Get medical help right away. If you have very low blood sugar and you cannot eat or drink, you will need to be given a hormone called glucagon. A family member or friend should learn how to check your blood sugar and how to give you glucagon. Ask your  doctor if you need to have an emergency glucagon kit at home. Very low blood sugar may also need to be treated in a hospital. Follow these instructions at home: General instructions Take over-the-counter and prescription medicines only as told by your doctor. Stay aware of your blood sugar as told by your doctor. If you drink alcohol: Limit how much you have to: 0-1 drink a day for women who are not pregnant. 0-2 drinks a day for men. Know how much alcohol is in your drink. In the [place] one drink equals one 12 oz bottle of beer (355 mL), one 5 oz glass of wine (148 mL), or one 1 oz glass of hard liquor (44 mL). Be sure to eat food when you drink alcohol. Know that your body absorbs alcohol quickly. This may lead to low blood sugar later. Be sure to keep checking your blood sugar. Keep all follow-up visits. If you have diabetes:  Always have a fast-acting carb (15 grams) with you to treat low blood sugar. Follow your diabetes care plan as told by your doctor. Make sure you: Know the symptoms of low blood sugar. Check your  blood sugar as often as told. Always check it before and after exercise. Always check your blood sugar before you drive. Take your medicines as told. Follow your meal plan. Eat on time. Do not skip meals. Share your diabetes care plan with: Your work or school. People you live with. Carry a card or wear jewelry that says you have diabetes. Where to find more information American Diabetes Association: www.diabetes.org Contact a doctor if: You have trouble keeping your blood sugar in your target range. You have low blood sugar often. Get help right away if: You still have symptoms after you eat or drink something that contains 15 grams of fast-acting carb, and you cannot get your blood sugar above 70 mg/dL by following the 15:15 rule. Your blood sugar is below 54 mg/dL (3 mmol/L). You have a seizure. You faint. These symptoms may be an emergency. Get help right  away. Call your local emergency services (911 in the [place] Do not wait to see if the symptoms will go away. Do not drive yourself to the hospital. Summary Hypoglycemia happens when the level of sugar (glucose) in your blood is too low. Low blood sugar can happen to people who have diabetes and people who do not have diabetes. Low blood sugar can happen quickly, and it can be an emergency. Make sure you know the symptoms of low blood sugar and know how to treat it. Always keep a source of sugar (fast-acting carb) with you to treat low blood sugar. This information is not intended to replace advice given to you by your health care provider. Make sure you discuss any questions you have with your health care provider. Document Revised: 05/12/2020 Document Reviewed: 05/12/2020 Elsevier Patient Education  [place].

## 2021-11-21 NOTE — Chronic Care Management (AMB) (Signed)
Care Management    RN Visit Note  11/21/2021 Name: Carol [name] MRN: [id number] DOB: 08/03/1935  Subjective: Carol [name] is a 86 y.o. year old female who is a primary care patient of Cox, Kirsten, MD. The care management team was consulted for assistance with disease management and care coordination needs.    Engaged with patient by telephone for follow up visit in response to provider referral for case management and/or care coordination services.   Consent to Services:   Carol [name] was given information about Care Management services today including:  Care Management services includes personalized support from designated clinical staff supervised by her physician, including individualized plan of care and coordination with other care providers 24/7 contact phone numbers for assistance for urgent and routine care needs. The patient may stop case management services at any time by phone call to the office staff.  Patient agreed to services and consent obtained.   Assessment: Review of patient past medical history, allergies, medications, health status, including review of consultants reports, laboratory and other test data, was performed as part of comprehensive evaluation and provision of chronic care management services.   SDOH (Social Determinants of Health) assessments and interventions performed:    Care Plan  Allergies  Allergen Reactions   Contrast Media [Iodinated Contrast Media] Other (See Comments)    unknown   Cortizone-10 [Hydrocortisone] Other (See Comments)    unknown   Lipitor [Atorvastatin]     Muscle aches- severe   Zocor [Simvastatin]     Muscle aches- severe    Outpatient Encounter Medications as of 11/21/2021  Medication Sig Note   albuterol (VENTOLIN HFA) 108 (90 Base) MCG/ACT inhaler Inhale into the lungs.    allopurinol (ZYLOPRIM) 300 MG tablet Take 300 mg by mouth every morning.     aspirin EC 81 MG tablet Take 1 tablet (81 mg total) by mouth  daily.    Biotin 5000 MCG CAPS Take 5,000 mcg by mouth every morning.     carvedilol (COREG) 6.25 MG tablet Take 6.25 mg by mouth 2 (two) times daily with a meal.    clopidogrel (PLAVIX) 75 MG tablet Take 1 tablet (75 mg total) by mouth daily.    colchicine 0.6 MG tablet Take 0.6 mg by mouth daily as needed (gout flare up.).     ezetimibe (ZETIA) 10 MG tablet Take 10 mg by mouth daily.    famotidine (PEPCID) 10 MG tablet Take 10 mg by mouth 2 (two) times daily as needed for heartburn or indigestion.     fluticasone (FLONASE) 50 MCG/ACT nasal spray USE 1 SPRAY NASALLY EVERY DAY    furosemide (LASIX) 80 MG tablet Take 80 mg by mouth daily.    gabapentin (NEURONTIN) 300 MG capsule Take 300 mg by mouth 2 (two) times daily.    insulin NPH-regular Human (NOVOLIN 70/30) (70-30) 100 UNIT/ML injection Inject 25-32 Units into the skin daily. Reports 20 units in morning and 10-12 at night if needed.    levothyroxine (SYNTHROID, LEVOTHROID) 50 MCG tablet Take 50 mcg by mouth every morning.     linaclotide (LINZESS) 72 MCG capsule Take 1 capsule (72 mcg total) by mouth daily before breakfast.    methocarbamol (ROBAXIN) 500 MG tablet Take 1-2 tablets (500-1,000 mg total) by mouth every 6 (six) hours as needed for muscle spasms.    nitroGLYCERIN (NITROSTAT) 0.4 MG SL tablet Place 0.4 mg under the tongue as needed.    pantoprazole (PROTONIX) 40 MG tablet TAKE 1  TABLET BY MOUTH ONCE DAILY 11/21/2021: Reports takes as needed   potassium chloride (KLOR-CON) 10 MEQ tablet Take 10 mEq by mouth daily.    TRADJENTA 5 MG TABS tablet Take 5 mg by mouth daily.    Alcohol Swabs (ALCOHOL WIPES) 70 % PADS USE ONE PAD TO CLEAN AREA TO CHECK BLOOD SUGAR AS  DIRECTED    Blood Glucose Monitoring Suppl (TRUE METRIX METER) DEVI Use to test blood sugars once daily. ICD: E11.65    Continuous Blood Gluc Receiver (FREESTYLE LIBRE READER) DEVI 1 each by Does not apply route as directed. (Patient not taking: Reported on 11/09/2021)     glucose blood (TRUE METRIX BLOOD GLUCOSE TEST) test strip Use as instructed    isosorbide mononitrate (IMDUR) 60 MG 24 hr tablet Take 1 tablet (60 mg total) by mouth daily.    TRUEplus Lancets 33G MISC Use to test blood sugar once daily. ICD: E11.65    No facility-administered encounter medications on file as of 11/21/2021.    Patient Active Problem List   Diagnosis Date Noted   Dizziness 11/10/2021   Myalgia 11/10/2021   Osteoporosis screening 10/03/2021   Myalgia due to statin 10/03/2021   Hypertensive heart and renal disease 10/03/2021   Other fatigue 10/03/2021   Chronic idiopathic constipation 09/28/2021   S/P total knee replacement 04/21/2018   Chronic combined systolic and diastolic heart failure ([place]) 04/19/2015   GERD (gastroesophageal reflux disease) 04/19/2015   Spinal stenosis, lumbar region, with neurogenic claudication 12/22/2014   Diabetes ([place]) 04/10/2014   Asthma, moderate persistent 03/23/2014   Post-infarction angina ([place]) 02/17/2014   Abnormal nuclear stress test: Intermediate risk 02/17/2014   Presence of drug coated stent in left circumflex coronary artery: OM2 - Xience Alpine DES 2.25 mm x 12, 2.25 mm x 8 mm overlap 02/17/2014    Class: Acute   Ischemic cardiomyopathy - with near resolution of LVEF post MI 02/15/2014   CKD stage 3 due to type 2 diabetes mellitus ([place]) 02/15/2014   Burning chest pain 01/22/2014   Hyperlipidemia with target LDL less than 70 01/22/2014   History of non-ST elevation myocardial infarction (NSTEMI) 01/22/2014   Coronary artery disease involving coronary bypass graft of native heart with angina pectoris ([place]) 01/22/2014   Mixed conductive and sensorineural hearing loss 12/05/2012   Essential hypertension 12/05/2012   Unspecified hypothyroidism 12/05/2012   Lower back pain 12/05/2012   Gout, unspecified     Conditions to be addressed/monitored: CHF, CAD, HTN, HLD, and DMII  Care Plan : RN Care Manager Plan of Care  Updates made  by Luretha Rued, RN since 11/21/2021 12:00 AM     Problem: Disease Management and Care Coordination Needs(DM, CAD, CHF, HTN, HLD)   Priority: High     Long-Range Goal: Establish Plan of Care for Care Coordination and Disease Management Needs(DM, CAD, CHF, HTN, HLD)   Start Date: 10/06/2021  Expected End Date: 10/06/2022  Priority: High  Note:   Current Barriers:  Knowledge Deficits related to plan of care for management of CHF, CAD, HTN, HLD, and DMII  Chronic Disease Management support and education needs related to CHF, CAD, HTN, HLD, and DMII  States she wants to get her diabetes under better control.  States she has not been eating as healthy as she should and eats a lot of crackers.  States this last week she has started eating more vegetables.  States that while she was getting unpacked from her move she was not looking after herself  as she should.  States that she checks her CBG 1-2 times a day and it was 117 this morning and has ranged from 117-200 this last week.  States she was not able to use the Libre because her phone was not compatible and was told the reader is very expensive. Denies any chest pains, increased swelling or shortness of breath.  States she has not been weighing daily or checking her B/P at home recently. 11/21/21 RNCM called to follow up. Carol [name] reports her blood sugar this morning was 57. She states she can tell when her blood sugar is low-she reports she treated by eating a milky way candy bar, then ate breakfast: Corn Flakes with Bananas, coffee and gram crackers. She states she did not recheck her blood sugar, but could tell it was no longer low. She reports she usually checks her blood sugar 3-4 times/day and will check before lunch. She reports her blood sugar last night was 165 (ate bedtime snack and gave herself a little insulin). She reports taking 70/30 insulin and states she will take 20 units in the morning and the rest(10-12 units at night if needed).  She states she is awaiting the Freestyle Libre to come in the mail. She states she checked blood pressure last night and does not remember exactly what it was she reports it was around 130/60. She states she continues to weigh self daily, but does not  record her weights and denies any signs/symptoms of Heart failure exacerbation. She is without questions or concerns at this time.  RNCM Clinical Goal(s):  Patient will verbalize understanding of plan for management of CHF, CAD, HTN, HLD, and DMII as evidenced by voiced adherence to plan of care verbalize basic understanding of  CHF, CAD, HTN, HLD, and DMII disease process and self health management plan as evidenced by voiced understanding and teach back  take all medications exactly as prescribed and will call provider for medication related questions as evidenced by dispense report and pt verbalization  attend all scheduled medical appointments: Dr. Cox 11/09/21, cardiology 01/11/22 as evidenced by medical records demonstrate Improved adherence to prescribed treatment plan for CHF, CAD, HTN, HLD, and DMII as evidenced by readings within limits, voiced adherence to plan of care continue to work with RN Care Manager to address care management and care coordination needs related to  CHF, CAD, HTN, HLD, and DMII as evidenced by adherence to CM Team Scheduled appointments through collaboration with RN Care manager, provider, and care team.   Interventions: 1:1 collaboration with primary care provider regarding development and update of comprehensive plan of care as evidenced by provider attestation and co-signature Inter-disciplinary care team collaboration (see longitudinal plan of care) Evaluation of current treatment plan related to  self management and patient's adherence to plan as established by provider [facility]ailed [facility]  calendar  CAD Interventions: (Status:  Goal on track:  Yes.) Long Term Goal Assessed understanding of CAD  diagnosis Medications reviewed and encouraged to continue to take as recommended  Reviewed Importance of taking all medications as prescribed Encouraged to attend provider visits as scheduled  Heart Failure Interventions:  (Status:  Goal on track:  Yes.) Long Term Goal Discussed importance of daily weight and advised patient to weigh and record daily Medications reviewed and encouraged to take as recommended Reviewed signs/symptoms of Heart Failure exacerbation Reinforced the importance of recording daily weights in management of heart failure  Diabetes Interventions:  (Status:  Goal on track:  Yes.) Long Term   Goal Assessed patient's understanding of A1c goal: <7% Reviewed medications with patient and discussed importance of medication adherence Provided patient with written educational materials related to hypo and hyperglycemia and importance of correct treatment Advised patient, providing education and rationale, to check cbg 2-3 times a day and record, calling provider for findings outside established parameters Discussed Rule of 15 in management of low blood sugar. Reviewed fasting blood sugar goals of 80-130 and less than 180 1 1/2-2 hours after meals Discussed the importance of adding protein and healthy fats in diet and when treating low blood sugar. Some suggestions such as egg, peanut butter, meats, legumes Embedded pharmacy referral: medication review; Medication reconciliation; freestyle libre (new) assist as needed/cost-Patient states will cost her about $50/month. Lab Results  Component Value Date   HGBA1C 8.0 (H) 09/29/2021  Hyperlipidemia Interventions:  (Status:  Goal on track:  Yes.) Long Term Goal Medication review performed; medication list updated in electronic medical record.  Provider established cholesterol goals reviewed Counseled on importance of regular laboratory monitoring as prescribed Reviewed importance of limiting foods high in cholesterol  Hypertension  Interventions:  (Status:  Goal on track:  Yes.) Long Term Goal Last practice recorded BP readings:  BP Readings from Last 3 Encounters:  11/09/21 (!) 146/60  09/28/21 138/60  05/13/21 (!) 182/63  Most recent eGFR/CrCl:  Lab Results  Component Value Date   EGFR 34 (L) 09/29/2021    No components found for: CRCL  Evaluation of current treatment plan related to hypertension self management and patient's adherence to plan as established by provider Reviewed medications with patient and discussed importance of compliance Advised patient to discuss Target blood pressure range with provider Provided education on prescribed diet low sodium, low CHO heart healthy  Patient Goals/Self-Care Activities: Take all medications as prescribed Attend all scheduled provider appointments Call pharmacy for medication refills 3-7 days in advance of running out of medications Perform all self care activities independently  Call provider office for new concerns or questions  Continue to weight daily and begin to record your weights and take with you to provider visits call office if I gain more than 2 pounds in one day or 5 pounds in one week watch for swelling in feet, ankles and legs every day weigh myself daily follow rescue plan if symptoms flare-up eat more whole grains, fruits and vegetables, lean meats and healthy fats keep appointment with eye doctor check blood sugar at prescribed times: three times daily and when you have symptoms of low or high blood sugar check feet daily for cuts, sores or redness enter blood sugar readings and medication or insulin into daily log drink 6 to 8 glasses of water each day fill half of plate with vegetables manage portion size check blood pressure 3 times per week choose a place to take my blood pressure (home, clinic or office, retail store) write blood pressure results in a log or diary limit salt intake to 2300mg/day Continue to be as active as possible  per provider recommendation  Follow Up Plan:  Telephone follow up appointment with care management team member scheduled for:  12/21/21 The patient has been provided with contact information for the care management team and has been advised to call with any health related questions or concerns.     Plan: Telephone follow up appointment with care management team member scheduled for:  12/21/21 The patient has been provided with contact information for the care management team and has been advised to call with any   health related questions or concerns.   Juana Wallace, RN, MSN, BSN, CCM Care Management Coordinator [phone]  

## 2021-11-22 NOTE — Progress Notes (Signed)
Tati RNCM referral to Pharmacy. Please see message from [place] and schedule with PharmD   Thank you   [place] Management  Direct Dial: [phone]   Pharmacy referral: Care Coordination: medication review; Medication reconciliation; review insulin dosing; freestyle libre (new) assist as needed/cost-Patient states will cost her about $50/month. Thank you.   Thea Silversmith, RN, MSN, BSN, CCM  Care Management Coordinator  [phone]

## 2021-12-06 ENCOUNTER — Ambulatory Visit (INDEPENDENT_AMBULATORY_CARE_PROVIDER_SITE_OTHER): Payer: Medicare HMO

## 2021-12-06 ENCOUNTER — Telehealth: Payer: Self-pay

## 2021-12-06 VITALS — BP 134/68 | HR 70 | Resp 16 | Ht 67.0 in | Wt 204.2 lb

## 2021-12-06 DIAGNOSIS — Z0000 Encounter for general adult medical examination without abnormal findings: Secondary | ICD-10-CM | POA: Diagnosis not present

## 2021-12-06 NOTE — Progress Notes (Signed)
Tati, I wanted to follow up on Juana's request for the Pharmacist to call  I do not see a call or appointment scheduled.   Thank you   [place] Management  Direct Dial: [phone]    Pharmacy referral: Care Coordination: medication review; Medication reconciliation; review insulin dosing; freestyle libre (new) assist as needed/cost-Patient states will cost her about $50/month. Thank you.

## 2021-12-10 ENCOUNTER — Other Ambulatory Visit: Payer: Self-pay | Admitting: Family Medicine

## 2021-12-10 NOTE — Telephone Encounter (Signed)
Done. Dr. Concha Sudol  

## 2021-12-20 NOTE — Progress Notes (Signed)
Cardiology Office Note:    Date:  12/22/2021   ID:  Carol [name], DOB 11/16/1935, MRN [id number]  PCP:  Rochel Brome, MD  Cardiologist:  Sinclair Grooms, MD   Referring MD: Rochel Brome, MD   Chief Complaint  Patient presents with   Congestive Heart Failure   Coronary Artery Disease   Hyperlipidemia   Hypertension    History of Present Illness:    Carol [name] is a 86 y.o. female with a hx of DM, HTN, asthma, hyperthyroidism, HLD,, CKD stage III, left bundle branch block, coronary artery disease with CABG October 2016 with LIMA to LAD, sequential SVG to OM 2 and 3, and SVG to RCA, chronic diastolic systolic heart failure with LVEF improved from 25% to 50%, comes in today as a new patient for longitudinal cardiac management.   Dyspnea on exertion with walking too fast or too far.  No dyspnea at rest orthopnea.  Stable angina with activity.  Variable threshold.  Occasional lower extremity swelling.  No claudication.  Easy bruising.  Past Medical History:  Diagnosis Date   Anemia    a. Noted on labs 02/2014 possibly procedurally related.   Asthma    CAD S/P percutaneous coronary angioplasty 01/22/2014   A. (12/2013): POBA - OM2 99% (very tortuous segment) - reduced ~ 50% & TIMI 3 flow, 80-90% stenosis in mid PDA, Irregularities <50% in mRCA, pLAD and prox LCx; b. 02/17/14: Moderate sized fixed defect in Lateral wall --> cath with OM2 restenosis & otherwise stable --> Xience Alpine DES x 2 (2.25 mm x 12 & 8 mm). c. NSTEMI 02/2014 s/p PTCA/balloon angioplasty only to small caliber PDA.   Carotid stenosis    Carotid US 11/17: L 1-39; FU prn   Chronic systolic heart failure ([place])    a. ICM b. ECHO (01/2014): EF 25-30%, grade I DD, trivial MR. c. EF by Myoview 02/17/14 = ~53%. d. EF by cath 02/24/14 - improved to 50-55%.   CKD (chronic kidney disease), stage III (HCC)    Contrast media allergy    Environmental allergies    GERD (gastroesophageal reflux disease)    Gout,  unspecified    HOH (hard of hearing)    Hyperlipidemia    a. H/o intolerance to Zocor, Lipitor. Had muscle aches on higher dose Crestor.   Hypertension    Hypothyroidism    LBBB (left bundle branch block)    a. Transient during 02/2014 admission.   NSTEMI (non-ST elevated myocardial infarction) ([place]) 2014; 12/2013, 01/2014   QT prolongation    a. Noted on EKG 02/2014.   Sinus bradycardia    a. HR 40s-50s during 02/2014 admission, limiting BB dose.   TIA (transient ischemic attack) 1990's   a. TIA vs stroke 1990's "mild" - sounds like a TIA as she was told she had stroke symptoms but negative scans. Denies residual effects.   Type II diabetes mellitus [facility])     Past Surgical History:  Procedure Laterality Date   ABDOMINAL HYSTERECTOMY     complete   AMPUTATION TOE Right 02/25/2021   hammer toe.   [place] CATHETERIZATION  02/24/2014   Procedure: CORONARY BALLOON ANGIOPLASTY;  Surgeon: Burnell Blanks, MD;  Location: [facility] CATH LAB;  Service: Cardiovascular;;   CARDIAC CATHETERIZATION N/A 04/20/2015   Procedure: Left Heart Cath and Coronary Angiography;  Surgeon: Belva Crome, MD;  Location: [facility] CV LAB;  Service: Cardiovascular;  Laterality: N/A;   CATARACT EXTRACTION  W/ INTRAOCULAR LENS  IMPLANT, BILATERAL Bilateral 2013   CESAREAN SECTION  1959; [place]  12/2013   POBA - OM2   CORONARY ARTERY BYPASS GRAFT N/A 04/25/2015   Procedure: CORONARY ARTERY BYPASS GRAFTING (CABG) x four,  using left internal mammary artery and right leg greater saphenous vein harvested endoscopically;  Surgeon: Gaye Pollack, MD;  Location: [facility] OR;  Service: Open Heart Surgery;  Laterality: N/A;   JOINT REPLACEMENT Right 10 years ago    right knee (TKR)   LEFT HEART CATHETERIZATION WITH CORONARY ANGIOGRAM N/A 01/22/2014   Procedure: LEFT HEART CATHETERIZATION WITH CORONARY ANGIOGRAM;  Surgeon: Sinclair Grooms, MD;  Location: [facility]  CATH LAB;  Service: Cardiovascular;  Laterality: N/A;   LEFT HEART CATHETERIZATION WITH CORONARY ANGIOGRAM N/A 02/17/2014   Procedure: LEFT HEART CATHETERIZATION WITH CORONARY ANGIOGRAM;  Surgeon: Burnell Blanks, MD;  Location: [facility] CATH LAB;  Service: Cardiovascular;  Laterality: N/A;   LEFT HEART CATHETERIZATION WITH CORONARY ANGIOGRAM N/A 02/24/2014   Procedure: LEFT HEART CATHETERIZATION WITH CORONARY ANGIOGRAM;  Surgeon: Burnell Blanks, MD;  Location: [facility] CATH LAB;  Service: Cardiovascular;  Laterality: N/A;   LUMBAR LAMINECTOMY/DECOMPRESSION MICRODISCECTOMY Left 12/22/2014   Procedure: CENTRAL DECOMPRESSIVE LUMBAR, AND LAMINECTOMY L5-S1,  S1-S2 FOR SPINAL STENOSIS FORAMINOTOMY L5, S1, S2 ROOTS ON LEFT FOR FORAMINAL STENOSIS, AND DECOMPRESSION L5-S1, S1-S2 ACCORDING TO LABELING OF [place] X-RAYS;  Surgeon: Latanya Maudlin, MD;  Location: [facility] ORS;  Service: Orthopedics;  Laterality: Left;   PERCUTANEOUS CORONARY STENT INTERVENTION (PCI-S)  02/17/2014   For restenosis of OM2 - PCI with Xience Apline DES 2.25 mm x 12 mm & 2.25 mm x 8 mm overlapping   REDUCTION MAMMAPLASTY Bilateral 1990's   SHOULDER OPEN ROTATOR CUFF REPAIR Bilateral 1980's - 1990's   TEE WITHOUT CARDIOVERSION N/A 04/25/2015   Procedure: TRANSESOPHAGEAL ECHOCARDIOGRAM (TEE);  Surgeon: Gaye Pollack, MD;  Location: [facility];  Service: Open Heart Surgery;  Laterality: N/A;   TOTAL KNEE ARTHROPLASTY Right 2009   TOTAL KNEE ARTHROPLASTY Left 04/21/2018   TKR;  Surgeon: Vickey Huger, MD;  Location: [facility] ORS;  Service: Orthopedics;  Laterality: Left;  Adductor Block    Current Medications: Current Meds  Medication Sig   albuterol (VENTOLIN HFA) 108 (90 Base) MCG/ACT inhaler Inhale into the lungs.   Alcohol Swabs (ALCOHOL WIPES) 70 % PADS USE ONE PAD TO CLEAN AREA TO CHECK BLOOD SUGAR AS  DIRECTED   allopurinol (ZYLOPRIM) 300 MG tablet Take 300 mg by mouth every morning.    Biotin 5000 MCG CAPS Take 5,000 mcg  by mouth every morning.    Blood Glucose Monitoring Suppl (TRUE METRIX METER) DEVI Use to test blood sugars once daily. ICD: E11.65   carvedilol (COREG) 6.25 MG tablet Take 6.25 mg by mouth 2 (two) times daily with a meal.   clopidogrel (PLAVIX) 75 MG tablet Take 1 tablet (75 mg total) by mouth daily.   colchicine 0.6 MG tablet Take 0.6 mg by mouth daily as needed (gout flare up.).    Continuous Blood Gluc Receiver (FREESTYLE LIBRE READER) DEVI 1 each by Does not apply route as directed.   ezetimibe (ZETIA) 10 MG tablet Take 10 mg by mouth daily.   famotidine (PEPCID) 10 MG tablet Take 10 mg by mouth 2 (two) times daily as needed for heartburn or indigestion.    fluticasone (FLONASE) 50 MCG/ACT nasal spray USE 1 SPRAY NASALLY EVERY DAY   furosemide (LASIX)  80 MG tablet Take 80 mg by mouth daily.   gabapentin (NEURONTIN) 300 MG capsule Take 300 mg by mouth 2 (two) times daily.   glucose blood (TRUE METRIX BLOOD GLUCOSE TEST) test strip Use as instructed   insulin NPH-regular Human (NOVOLIN 70/30) (70-30) 100 UNIT/ML injection Inject 25-32 Units into the skin daily. Reports 20 units in morning and 10-12 at night if needed.   levothyroxine (SYNTHROID, LEVOTHROID) 50 MCG tablet Take 50 mcg by mouth every morning.    linaclotide (LINZESS) 72 MCG capsule Take 1 capsule (72 mcg total) by mouth daily before breakfast.   methocarbamol (ROBAXIN) 500 MG tablet Take 1-2 tablets (500-1,000 mg total) by mouth every 6 (six) hours as needed for muscle spasms.   nitroGLYCERIN (NITROSTAT) 0.4 MG SL tablet Place 0.4 mg under the tongue as needed.   pantoprazole (PROTONIX) 40 MG tablet TAKE 1 TABLET BY MOUTH ONCE DAILY   potassium chloride (KLOR-CON) 10 MEQ tablet Take 10 mEq by mouth daily.   TRADJENTA 5 MG TABS tablet Take 5 mg by mouth daily.   TRUEplus Lancets 33G MISC Use to test blood sugar once daily. ICD: E11.65   [DISCONTINUED] aspirin EC 81 MG tablet Take 1 tablet (81 mg total) by mouth daily.      Allergies:   Iodinated contrast media, Atorvastatin, Corticosteroids, Hydrocodone, Other, Oxycodone, Simvastatin, and Cortizone-10 [hydrocortisone]   Social History   Socioeconomic History   Marital status: Widowed    Spouse name: Not on file   Number of children: 2   Years of education: Not on file   Highest education level: Not on file  Occupational History   Occupation: Retired  Tobacco Use   Smoking status: Never   Smokeless tobacco: Never  Vaping Use   Vaping Use: Never used  Substance and Sexual Activity   Alcohol use: No   Drug use: No   Sexual activity: Never  Other Topics Concern   Not on file  Social History Narrative   Lives in [place] by herself.    Social Determinants of Health   Financial Resource Strain: Low Risk  (10/06/2021)   Overall Financial Resource Strain (CARDIA)    Difficulty of Paying Living Expenses: Not hard at all  Food Insecurity: No Food Insecurity (10/06/2021)   Hunger Vital Sign    Worried About Running Out of Food in the Last Year: Never true    Ran Out of Food in the Last Year: Never true  Transportation Needs: No Transportation Needs (10/06/2021)   PRAPARE - Hydrologist (Medical): No    Lack of Transportation (Non-Medical): No  Physical Activity: Sufficiently Active (10/06/2021)   Exercise Vital Sign    Days of Exercise per Week: 6 days    Minutes of Exercise per Session: 40 min  Stress: No Stress Concern Present (10/06/2021)   [place]    Feeling of Stress : Not at all  Social Connections: Not on file     Family History: The patient's family history includes Cancer in her sister; Diabetes in her brother and mother; Emphysema in her father; Heart attack (age of onset: 16) in her mother; Heart disease in her brother and father; Hypertension in her mother. There is no history of Stroke.  ROS:   Please see the history of present illness.     Easy bruising.  Her shoulders hurt if her neck is in a certain position.  All other systems reviewed  and are negative.  EKGs/Labs/Other Studies Reviewed:    The following studies were reviewed today: 2D Doppler echocardiogram 2016: Study Conclusions   - Left ventricle: The cavity size was normal. Wall thickness was    normal. Systolic function was normal. The estimated ejection    fraction was in the range of 60% to 65%. Doppler parameters are    consistent with abnormal left ventricular relaxation (grade 1    diastolic dysfunction).   Impressions:   - Poor acoustic windows limit study.   EKG:  EKG performed on December 07, 2021 demonstrates reveals sinus rhythm, low voltage, poor R wave progression  Recent Labs: 09/29/2021: TSH 3.250 11/09/2021: ALT 15; BUN 42; Creatinine, Ser 1.63; Hemoglobin 11.6; Magnesium 2.4; Platelets 201; Potassium 4.5; Sodium 143  Recent Lipid Panel    Component Value Date/Time   CHOL 177 09/29/2021 1027   TRIG 131 09/29/2021 1027   HDL 50 09/29/2021 1027   CHOLHDL 3.5 09/29/2021 1027   CHOLHDL 4 07/26/2014 1115   VLDL 39.0 07/26/2014 1115   LDLCALC 104 (H) 09/29/2021 1027    Physical Exam:    VS:  BP 130/60   Pulse 60   Ht '5\' 7"'$  (1.702 m)   Wt 200 lb 6.4 oz (90.9 kg)   SpO2 97%   BMI 31.39 kg/m     Wt Readings from Last 3 Encounters:  12/22/21 200 lb 6.4 oz (90.9 kg)  12/21/21 200 lb (90.7 kg)  12/06/21 204 lb 3.2 oz (92.6 kg)     GEN: Obese. No acute distress HEENT: Normal NECK: No JVD. LYMPHATICS: No lymphadenopathy CARDIAC: No murmur. RRR no gallop, or edema. VASCULAR:  Normal Pulses. No bruits. RESPIRATORY:  Clear to auscultation without rales, wheezing or rhonchi  ABDOMEN: Soft, non-tender, non-distended, No pulsatile mass, MUSCULOSKELETAL: No deformity  SKIN: Warm and dry NEUROLOGIC:  Alert and oriented x 3 PSYCHIATRIC:  Normal affect   ASSESSMENT:    1. Chronic combined systolic and diastolic heart failure ([place])   2.  CAD in native artery   3. Other hyperlipidemia   4. Ischemic cardiomyopathy - with near resolution of LVEF post MI   5. Essential hypertension   6. Hypertensive heart and renal disease with renal failure, stage 1 through stage 4 or unspecified chronic kidney disease, with heart failure ([place])   7. SOB (shortness of breath)    PLAN:    In order of problems listed above:  Repeat 2D Doppler echocardiogram to reassess LV function with particular interest on diastolic function.  Consider SGLT2 therapy which may also help with A1c. De-escalate dual antiplatelet therapy to monotherapy with clopidogrel Continue aggressive lipid-lowering with Zetia.  Most recent LDL done in April was 104.  Numbers need to be better.  She has failed simvastatin and atorvastatin due to musculoskeletal symptoms.  She needs to go to lipid clinic.  Target LDL should be less than 70 at a minimum. 2D Doppler echo Blood pressure control is adequate. May need better contro which could be provided by an SGLT2 Echo for diastolic function   Overall education and awareness concerning secondary risk prevention was discussed in detail: LDL less than 70, hemoglobin A1c less than 7, blood pressure target less than 130/80 mmHg, >150 minutes of moderate aerobic activity per week, avoidance of smoking, weight control (via diet and exercise), and continued surveillance/management of/for obstructive sleep apnea.    Medication Adjustments/Labs and Tests Ordered: Current medicines are reviewed at length with the patient today.  Concerns regarding medicines are  outlined above.  Orders Placed This Encounter  Procedures   ECHOCARDIOGRAM COMPLETE   No orders of the defined types were placed in this encounter.   Patient Instructions  Medication Instructions:  Your physician has recommended you make the following change in your medication:   1) STOP Aspirin 81 mg  *If you need a refill on your cardiac medications before your next  appointment, please call your pharmacy*  Lab Work: NONE  Testing/Procedures: Your physician has requested that you have an echocardiogram. Echocardiography is a painless test that uses sound waves to create images of your heart. It provides your doctor with information about the size and shape of your heart and how well your heart's chambers and valves are working. This procedure takes approximately one hour. There are no restrictions for this procedure.  Follow-Up: At [facility], you and your health needs are our priority.  As part of our continuing mission to provide you with exceptional heart care, we have created designated Provider Care Teams.  These Care Teams include your primary Cardiologist (physician) and Advanced Practice Providers (APPs -  Physician Assistants and Nurse Practitioners) who all work together to provide you with the care you need, when you need it.  Your next appointment:   1 year(s)  The format for your next appointment:   In Person  Provider:   Sinclair Grooms, MD {   Important Information About Sugar         Signed, Sinclair Grooms, MD  12/22/2021 9:07 AM    [place]

## 2021-12-21 ENCOUNTER — Ambulatory Visit (INDEPENDENT_AMBULATORY_CARE_PROVIDER_SITE_OTHER): Payer: Medicare HMO

## 2021-12-21 VITALS — Wt 200.0 lb

## 2021-12-21 DIAGNOSIS — I10 Essential (primary) hypertension: Secondary | ICD-10-CM

## 2021-12-21 DIAGNOSIS — I5042 Chronic combined systolic (congestive) and diastolic (congestive) heart failure: Secondary | ICD-10-CM

## 2021-12-21 DIAGNOSIS — E7800 Pure hypercholesterolemia, unspecified: Secondary | ICD-10-CM

## 2021-12-21 NOTE — Patient Instructions (Signed)
Visit Information  Thank you for taking time to visit with me today. Please don't hesitate to contact me if I can be of assistance to you before our next scheduled telephone appointment.  Following are the goals we discussed today:  Take all medications as prescribed Attend all scheduled provider appointments Call pharmacy for medication refills 3-7 days in advance of running out of medications Perform all self care activities independently  Call provider office for new concerns or questions  Continue to weight daily and begin to record your weights and take with you to provider visits call office if I gain more than 2 pounds in one day or 5 pounds in one week watch for swelling in feet, ankles and legs every day weigh myself daily follow rescue plan if symptoms flare-up eat more whole grains, fruits and vegetables, lean meats and healthy fats keep appointment with eye doctor check blood sugar at prescribed times: three times daily and when you have symptoms of low or high blood sugar check feet daily for cuts, sores or redness enter blood sugar readings and medication or insulin into daily log drink 6 to 8 glasses of water each day fill half of plate with vegetables manage portion size check blood pressure 3 times per week choose a place to take my blood pressure (home, clinic or office, retail store) write blood pressure results in a log or diary limit salt intake to '2300mg'$ /day Continue to be as active as possible per provider recommendation  Our next appointment is by telephone on 01/11/2022 at 1pm  Please call the care guide team at [phone] if you need to cancel or reschedule your appointment.   If you are experiencing a Mental Health or [place] or need someone to talk to, please call the Suicide and Crisis Lifeline: 988 call the [place] National Suicide Prevention Lifeline: (617)870-0285 or TTY: 540-879-0726 TTY 346-872-7816) to talk to a trained  counselor call 1-800-273-TALK (toll free, 24 hour hotline) call 911   Patient verbalizes understanding of instructions and care plan provided today and agrees to view in [place]. Active MyChart status and patient understanding of how to access instructions and care plan via MyChart confirmed with patient.     Tomasa Rand RN, BSN, CEN RN Case Freight forwarder - [employer]/gallery exhibitions officer Mobile: [phone]

## 2021-12-21 NOTE — Chronic Care Management (AMB) (Signed)
Chronic Care Management   CCM RN Visit Note  12/21/2021 Name: Carol [name] MRN: [id number] DOB: 10/11/1935  Subjective: Carol [name] is a 86 y.o. year old female who is a primary care patient of Cox, Kirsten, MD. The care management team was consulted for assistance with disease management and care coordination needs.    Engaged with patient by telephone for follow up visit in response to provider referral for case management and/or care coordination services.   Consent to Services:  The patient was given information about Chronic Care Management services, agreed to services, and gave verbal consent prior to initiation of services.  Please see initial visit note for detailed documentation.   Patient agreed to services and verbal consent obtained.   Assessment: Review of patient past medical history, allergies, medications, health status, including review of consultants reports, laboratory and other test data, was performed as part of comprehensive evaluation and provision of chronic care management services.   SDOH (Social Determinants of Health) assessments and interventions performed:    CCM Care Plan  Allergies  Allergen Reactions   Iodinated Contrast Media Other (See Comments) and Anaphylaxis    unknown Contrast agent    Atorvastatin Other (See Comments)    Muscle aches- severe Muscle aches- severe Muscle aches- severe Muscle aches- severe   Corticosteroids Other (See Comments)    Elevated blood sugar  Elevated blood sugar   Hydrocodone Nausea And Vomiting   Other Nausea And Vomiting    Pain medications except can tramadol   Oxycodone Nausea And Vomiting   Simvastatin Other (See Comments)    Muscle aches- severe Muscle aches- severe Muscle aches- severe Muscle aches- severe   Cortizone-10 [Hydrocortisone] Other (See Comments)    unknown    Outpatient Encounter Medications as of 12/21/2021  Medication Sig Note   albuterol (VENTOLIN HFA) 108 (90 Base)  MCG/ACT inhaler Inhale into the lungs.    Alcohol Swabs (ALCOHOL WIPES) 70 % PADS USE ONE PAD TO CLEAN AREA TO CHECK BLOOD SUGAR AS  DIRECTED    allopurinol (ZYLOPRIM) 300 MG tablet Take 300 mg by mouth every morning.     aspirin EC 81 MG tablet Take 1 tablet (81 mg total) by mouth daily.    Biotin 5000 MCG CAPS Take 5,000 mcg by mouth every morning.     Blood Glucose Monitoring Suppl (TRUE METRIX METER) DEVI Use to test blood sugars once daily. ICD: E11.65    carvedilol (COREG) 6.25 MG tablet Take 6.25 mg by mouth 2 (two) times daily with a meal.    clopidogrel (PLAVIX) 75 MG tablet Take 1 tablet (75 mg total) by mouth daily.    colchicine 0.6 MG tablet Take 0.6 mg by mouth daily as needed (gout flare up.).     Continuous Blood Gluc Receiver (FREESTYLE LIBRE READER) DEVI 1 each by Does not apply route as directed. (Patient not taking: Reported on 11/09/2021)    ezetimibe (ZETIA) 10 MG tablet Take 10 mg by mouth daily.    famotidine (PEPCID) 10 MG tablet Take 10 mg by mouth 2 (two) times daily as needed for heartburn or indigestion.     fluticasone (FLONASE) 50 MCG/ACT nasal spray USE 1 SPRAY NASALLY EVERY DAY    furosemide (LASIX) 80 MG tablet Take 80 mg by mouth daily.    gabapentin (NEURONTIN) 300 MG capsule Take 300 mg by mouth 2 (two) times daily.    glucose blood (TRUE METRIX BLOOD GLUCOSE TEST) test strip Use as instructed  insulin NPH-regular Human (NOVOLIN 70/30) (70-30) 100 UNIT/ML injection Inject 25-32 Units into the skin daily. Reports 20 units in morning and 10-12 at night if needed.    isosorbide mononitrate (IMDUR) 60 MG 24 hr tablet Take 1 tablet (60 mg total) by mouth daily.    levothyroxine (SYNTHROID, LEVOTHROID) 50 MCG tablet Take 50 mcg by mouth every morning.     linaclotide (LINZESS) 72 MCG capsule Take 1 capsule (72 mcg total) by mouth daily before breakfast.    methocarbamol (ROBAXIN) 500 MG tablet Take 1-2 tablets (500-1,000 mg total) by mouth every 6 (six) hours as  needed for muscle spasms.    nitroGLYCERIN (NITROSTAT) 0.4 MG SL tablet Place 0.4 mg under the tongue as needed.    pantoprazole (PROTONIX) 40 MG tablet TAKE 1 TABLET BY MOUTH ONCE DAILY 11/21/2021: Reports takes as needed   potassium chloride (KLOR-CON) 10 MEQ tablet Take 10 mEq by mouth daily.    TRADJENTA 5 MG TABS tablet Take 5 mg by mouth daily.    TRUEplus Lancets 33G MISC Use to test blood sugar once daily. ICD: E11.65    No facility-administered encounter medications on file as of 12/21/2021.    Patient Active Problem List   Diagnosis Date Noted   Dizziness 11/10/2021   Myalgia 11/10/2021   Osteoporosis screening 10/03/2021   Myalgia due to statin 10/03/2021   Hypertensive heart and renal disease 10/03/2021   Other fatigue 10/03/2021   Chronic idiopathic constipation 09/28/2021   S/P TKR (total knee replacement) using cement, left 04/21/2018   Hx of CABG 04/25/2015   Chronic combined systolic and diastolic heart failure ([place]) 04/19/2015   GERD (gastroesophageal reflux disease) 04/19/2015   Spinal stenosis, lumbar region, with neurogenic claudication 12/22/2014   Diabetes ([place]) 04/10/2014   Asthma, moderate persistent 03/23/2014   Post-infarction angina ([place]) 02/17/2014   Abnormal nuclear stress test: Intermediate risk 02/17/2014   Presence of drug coated stent in left circumflex coronary artery: OM2 - Xience Alpine DES 2.25 mm x 12, 2.25 mm x 8 mm overlap 02/17/2014    Class: Acute   Ischemic cardiomyopathy - with near resolution of LVEF post MI 02/15/2014   Chronic kidney disease (CKD), stage III (moderate) ([place]) 02/15/2014   Diabetes mellitus due to underlying condition, uncontrolled, with diabetic neuropathy, with long-term current use of insulin 02/01/2014   Left-sided chest pain 01/22/2014   Hyperlipidemia 01/22/2014   History of non-ST elevation myocardial infarction (NSTEMI) 01/22/2014   CAD in native artery 01/22/2014   Mixed conductive and sensorineural hearing  loss 12/05/2012   Essential hypertension 12/05/2012   Acquired hypothyroidism 12/05/2012   Lower back pain 12/05/2012   Gout, unspecified     Conditions to be addressed/monitored:CHF, HTN, HLD, and DMII  Care Plan : RN Care Manager Plan of Care  Updates made by Thana Ates, RN since 12/21/2021 12:00 AM     Problem: Disease Management and Care Coordination Needs(DM, CAD, CHF, HTN, HLD)   Priority: High     Long-Range Goal: Establish Plan of Care for Care Coordination and Disease Management Needs(DM, CAD, CHF, HTN, HLD)   Start Date: 10/06/2021  Expected End Date: 10/06/2022  Priority: High  Note:   Current Barriers:  Knowledge Deficits related to plan of care for management of CHF, CAD, HTN, HLD, and DMII  Chronic Disease Management support and education needs related to CHF, CAD, HTN, HLD, and DMII  States she wants to get her diabetes under better control.  States she has not been  eating as healthy as she should and eats a lot of crackers.  States this last week she has started eating more vegetables.  States that while she was getting unpacked from her move she was not looking after herself as she should.  States that she checks her CBG 1-2 times a day and it was 117 this morning and has ranged from 117-200 this last week.  States she was not able to use the [place] because her phone was not compatible and was told the reader is very expensive. Denies any chest pains, increased swelling or shortness of breath.  States she has not been weighing daily or checking her B/P at home recently. 11/21/21 RNCM called to follow up. Ms. Aguilera reports her blood sugar this morning was 57. She states she can tell when her blood sugar is low-she reports she treated by eating a milky way candy bar, then ate breakfast: Corn Flakes with Bananas, coffee and gram crackers. She states she did not recheck her blood sugar, but could tell it was no longer low. She reports she usually checks her blood sugar 3-4  times/day and will check before lunch. She reports her blood sugar last night was 165 (ate bedtime snack and gave herself a little insulin). She reports taking 70/30 insulin and states she will take 20 units in the morning and the rest(10-12 units at night if needed). She states she is awaiting the Freestyle Libre to come in the mail. She states she checked blood pressure last night and does not remember exactly what it was she reports it was around 130/60. She states she continues to weigh self daily, but does not  record her weights and denies any signs/symptoms of Heart failure exacerbation. She is without questions or concerns at this time. 12/21/2021  Spoke with patient who reports that she is dong well.  DM: reports CBG after lunch today of 160.  Reports she loves her new freestyle libre.  Report no recent low readings.  Reports feet are good. Denies any recent low CBG readings. Reports she is monitoring atleast 4 times per day. Reports she had been on prednisone the last time before she had her A1c checked.  HTN: Patient reports she is self monitoring and does not remember her reading today. Reports her blood pressure has been normal.  Reports she continues to follow a low salt diet. CHF: denies any swelling. Reports her weight is unchanged.  Hearing: reports to me that she is very hard of hearing. Reports she has new hearing aids but it hasn't help much.    RNCM Clinical Goal(s):  Patient will verbalize understanding of plan for management of CHF, CAD, HTN, HLD, and DMII as evidenced by voiced adherence to plan of care verbalize basic understanding of  CHF, CAD, HTN, HLD, and DMII disease process and self health management plan as evidenced by voiced understanding and teach back  take all medications exactly as prescribed and will call provider for medication related questions as evidenced by dispense report and pt verbalization  attend all scheduled medical appointments: Dr. Tobie Poet 11/09/21, cardiology  01/11/22 as evidenced by medical records demonstrate Improved adherence to prescribed treatment plan for CHF, CAD, HTN, HLD, and DMII as evidenced by readings within limits, voiced adherence to plan of care continue to work with RN Care Manager to address care management and care coordination needs related to  CHF, CAD, HTN, HLD, and DMII as evidenced by adherence to CM Team Scheduled appointments through collaboration with RN  Care manager, provider, and care team.   Interventions: 1:1 collaboration with primary care provider regarding development and update of comprehensive plan of care as evidenced by provider attestation and co-signature Inter-disciplinary care team collaboration (see longitudinal plan of care) Evaluation of current treatment plan related to  self management and patient's adherence to plan as established by provider [place]  calendar  CAD Interventions: (Status:  Goal Met.) Long Term Goal Assessed understanding of CAD diagnosis Medications reviewed and encouraged to continue to take as recommended  Reviewed Importance of taking all medications as prescribed Encouraged to attend provider visits as scheduled  Heart Failure Interventions:  (Status:  Goal Met.) Long Term Goal Today's Vitals   12/21/21 1339  Weight: 200 lb (90.7 kg)  PainSc: 0-No pain   Discussed importance of daily weight and advised patient to weigh and record daily Medications reviewed and encouraged to take as recommended Reviewed signs/symptoms of Heart Failure exacerbation Reinforced the importance of recording daily weights in management of heart failure  Diabetes Interventions:  (Status:  Goal on track:  Yes.) Long Term Goal Assessed patient's understanding of A1c goal: <7% Reviewed medications with patient and discussed importance of medication adherence Provided patient with written educational materials related to hypo and hyperglycemia and importance of correct  treatment Advised patient, providing education and rationale, to check cbg 2-3 times a day and record, calling provider for findings outside established parameters Discussed Rule of 15 in management of low blood sugar. Reviewed fasting blood sugar goals of 80-130 and less than 180 1 1/2-2 hours after meals Discussed the importance of adding protein and healthy fats in diet and when treating low blood sugar. Some suggestions such as egg, peanut butter, meats, legumes  Lab Results  Component Value Date   HGBA1C 8.0 (H) 09/29/2021   Pending labs on 01/05/2022  Reviewed with patient to continue to take her medications as prescribed and self monitor   Hyperlipidemia Interventions:  (Status:  Goal Met.) Long Term Goal Medication review performed; medication list updated in electronic medical record.  Provider established cholesterol goals reviewed Counseled on importance of regular laboratory monitoring as prescribed Reviewed importance of limiting foods high in cholesterol  Hypertension Interventions:  (Status:  Goal on track:  Yes.) Long Term Goal Last practice recorded BP readings:  BP Readings from Last 3 Encounters:  12/06/21 134/68  11/09/21 (!) 146/60  09/28/21 138/60  Most recent eGFR/CrCl:  Lab Results  Component Value Date   EGFR 31 (L) 11/09/2021      Evaluation of current treatment plan related to hypertension self management and patient's adherence to plan as established by provider Reviewed medications with patient and discussed importance of compliance Advised patient to discuss Target blood pressure range with provider Provided education on prescribed diet low sodium, low CHO heart healthy Encouraged patient to continue to self monitor, take medications as prescribed and follow her low salt diet.   Patient Goals/Self-Care Activities: Take all medications as prescribed Attend all scheduled provider appointments Call pharmacy for medication refills 3-7 days in advance  of running out of medications Perform all self care activities independently  Call provider office for new concerns or questions  Continue to weight daily and begin to record your weights and take with you to provider visits call office if I gain more than 2 pounds in one day or 5 pounds in one week watch for swelling in feet, ankles and legs every day weigh myself daily follow rescue plan if symptoms flare-up  eat more whole grains, fruits and vegetables, lean meats and healthy fats keep appointment with eye doctor check blood sugar at prescribed times: three times daily and when you have symptoms of low or high blood sugar check feet daily for cuts, sores or redness enter blood sugar readings and medication or insulin into daily log drink 6 to 8 glasses of water each day fill half of plate with vegetables manage portion size check blood pressure 3 times per week choose a place to take my blood pressure (home, clinic or office, retail store) write blood pressure results in a log or diary limit salt intake to 2392m/day Continue to be as active as possible per provider recommendation       Plan:Telephone follow up appointment with care management team member scheduled for:  01/11/2022 ATomasa RandRN, BSN, CEN RN Case Manager - [facility] FMuseum/gallery exhibitions officerMobile: [phone]

## 2021-12-22 ENCOUNTER — Ambulatory Visit (INDEPENDENT_AMBULATORY_CARE_PROVIDER_SITE_OTHER): Payer: Medicare HMO | Admitting: Interventional Cardiology

## 2021-12-22 ENCOUNTER — Encounter: Payer: Self-pay | Admitting: Interventional Cardiology

## 2021-12-22 VITALS — BP 130/60 | HR 60 | Ht 67.0 in | Wt 200.4 lb

## 2021-12-22 DIAGNOSIS — I509 Heart failure, unspecified: Secondary | ICD-10-CM

## 2021-12-22 DIAGNOSIS — I10 Essential (primary) hypertension: Secondary | ICD-10-CM

## 2021-12-22 DIAGNOSIS — I5042 Chronic combined systolic (congestive) and diastolic (congestive) heart failure: Secondary | ICD-10-CM | POA: Diagnosis not present

## 2021-12-22 DIAGNOSIS — I110 Hypertensive heart disease with heart failure: Secondary | ICD-10-CM

## 2021-12-22 DIAGNOSIS — Z794 Long term (current) use of insulin: Secondary | ICD-10-CM | POA: Diagnosis not present

## 2021-12-22 DIAGNOSIS — I2510 Atherosclerotic heart disease of native coronary artery without angina pectoris: Secondary | ICD-10-CM

## 2021-12-22 DIAGNOSIS — I255 Ischemic cardiomyopathy: Secondary | ICD-10-CM | POA: Diagnosis not present

## 2021-12-22 DIAGNOSIS — I130 Hypertensive heart and chronic kidney disease with heart failure and stage 1 through stage 4 chronic kidney disease, or unspecified chronic kidney disease: Secondary | ICD-10-CM | POA: Diagnosis not present

## 2021-12-22 DIAGNOSIS — E7849 Other hyperlipidemia: Secondary | ICD-10-CM

## 2021-12-22 DIAGNOSIS — E785 Hyperlipidemia, unspecified: Secondary | ICD-10-CM

## 2021-12-22 DIAGNOSIS — R0602 Shortness of breath: Secondary | ICD-10-CM | POA: Diagnosis not present

## 2021-12-22 DIAGNOSIS — E1159 Type 2 diabetes mellitus with other circulatory complications: Secondary | ICD-10-CM

## 2021-12-22 NOTE — Patient Instructions (Signed)
Medication Instructions:  Your physician has recommended you make the following change in your medication:   1) STOP Aspirin 81 mg  *If you need a refill on your cardiac medications before your next appointment, please call your pharmacy*  Lab Work: NONE  Testing/Procedures: Your physician has requested that you have an echocardiogram. Echocardiography is a painless test that uses sound waves to create images of your heart. It provides your doctor with information about the size and shape of your heart and how well your heart's chambers and valves are working. This procedure takes approximately one hour. There are no restrictions for this procedure.  Follow-Up: At [facility], you and your health needs are our priority.  As part of our continuing mission to provide you with exceptional heart care, we have created designated Provider Care Teams.  These Care Teams include your primary Cardiologist (physician) and Advanced Practice Providers (APPs -  Physician Assistants and Nurse Practitioners) who all work together to provide you with the care you need, when you need it.  Your next appointment:   1 year(s)  The format for your next appointment:   In Person  Provider:   Sinclair Grooms, MD {   Important Information About Sugar

## 2021-12-25 ENCOUNTER — Telehealth: Payer: Self-pay

## 2021-12-25 DIAGNOSIS — E7849 Other hyperlipidemia: Secondary | ICD-10-CM

## 2021-12-25 NOTE — Telephone Encounter (Signed)
Spoke with patient to discuss Dr. Thompson Caul recommendation.  Per Dr. Tamala Julian (after visit on 12/22/21): LDL 101, goal is <70. Refer to lipid clinic for cholesterol management.  Patient voiced concern with having to come to [facility] office for the [facility]. She states she lives in [place] and does not have assistance with transportation and she does not feel comfortable driving to [place] by herself unless she absolutely needs to.  Patient scheduled to have echo performed on 01/10/22 at our [facility] office and is agreeable to being seen in [facility] if appt can be made for the same day.  Referral placed and message sent to Pharm D to schedule appt.

## 2021-12-27 DIAGNOSIS — E1122 Type 2 diabetes mellitus with diabetic chronic kidney disease: Secondary | ICD-10-CM | POA: Diagnosis not present

## 2022-01-03 DIAGNOSIS — N959 Unspecified menopausal and perimenopausal disorder: Secondary | ICD-10-CM | POA: Diagnosis not present

## 2022-01-03 DIAGNOSIS — M8580 Other specified disorders of bone density and structure, unspecified site: Secondary | ICD-10-CM | POA: Diagnosis not present

## 2022-01-03 DIAGNOSIS — M85852 Other specified disorders of bone density and structure, left thigh: Secondary | ICD-10-CM | POA: Diagnosis not present

## 2022-01-04 ENCOUNTER — Other Ambulatory Visit: Payer: Self-pay

## 2022-01-04 DIAGNOSIS — Z13820 Encounter for screening for osteoporosis: Secondary | ICD-10-CM

## 2022-01-05 ENCOUNTER — Other Ambulatory Visit: Payer: Medicare HMO

## 2022-01-05 DIAGNOSIS — E1122 Type 2 diabetes mellitus with diabetic chronic kidney disease: Secondary | ICD-10-CM

## 2022-01-05 DIAGNOSIS — N184 Chronic kidney disease, stage 4 (severe): Secondary | ICD-10-CM | POA: Diagnosis not present

## 2022-01-05 DIAGNOSIS — I10 Essential (primary) hypertension: Secondary | ICD-10-CM

## 2022-01-05 DIAGNOSIS — E7800 Pure hypercholesterolemia, unspecified: Secondary | ICD-10-CM

## 2022-01-05 DIAGNOSIS — Z794 Long term (current) use of insulin: Secondary | ICD-10-CM | POA: Diagnosis not present

## 2022-01-06 LAB — COMPREHENSIVE METABOLIC PANEL
ALT: 10 IU/L (ref 0–32)
AST: 14 IU/L (ref 0–40)
Albumin/Globulin Ratio: 2 (ref 1.2–2.2)
Albumin: 4.3 g/dL (ref 3.7–4.7)
Alkaline Phosphatase: 120 IU/L (ref 44–121)
BUN/Creatinine Ratio: 25 (ref 12–28)
BUN: 35 mg/dL — ABNORMAL HIGH (ref 8–27)
Bilirubin Total: 0.3 mg/dL (ref 0.0–1.2)
CO2: 26 mmol/L (ref 20–29)
Calcium: 9.4 mg/dL (ref 8.7–10.3)
Chloride: 102 mmol/L (ref 96–106)
Creatinine, Ser: 1.4 mg/dL — ABNORMAL HIGH (ref 0.57–1.00)
Globulin, Total: 2.2 g/dL (ref 1.5–4.5)
Glucose: 94 mg/dL (ref 70–99)
Potassium: 5 mmol/L (ref 3.5–5.2)
Sodium: 141 mmol/L (ref 134–144)
Total Protein: 6.5 g/dL (ref 6.0–8.5)
eGFR: 37 mL/min/{1.73_m2} — ABNORMAL LOW (ref >59)

## 2022-01-06 LAB — CBC WITH DIFF/PLATELET
Basophils Absolute: 0.1 10*3/uL (ref 0.0–0.2)
Basos: 1 %
EOS (ABSOLUTE): 0.1 10*3/uL (ref 0.0–0.4)
Eos: 1 %
Hematocrit: 35 % (ref 34.0–46.6)
Hemoglobin: 11.8 g/dL (ref 11.1–15.9)
Immature Grans (Abs): 0 10*3/uL (ref 0.0–0.1)
Immature Granulocytes: 0 %
Lymphocytes Absolute: 1.8 10*3/uL (ref 0.7–3.1)
Lymphs: 25 %
MCH: 31.8 pg (ref 26.6–33.0)
MCHC: 33.7 g/dL (ref 31.5–35.7)
MCV: 94 fL (ref 79–97)
Monocytes Absolute: 0.7 10*3/uL (ref 0.1–0.9)
Monocytes: 9 %
Neutrophils Absolute: 4.5 10*3/uL (ref 1.4–7.0)
Neutrophils: 64 %
Platelets: 201 10*3/uL (ref 150–450)
RBC: 3.71 x10E6/uL — ABNORMAL LOW (ref 3.77–5.28)
RDW: 13.2 % (ref 11.7–15.4)
WBC: 7.1 10*3/uL (ref 3.4–10.8)

## 2022-01-06 LAB — LIPID PANEL
Chol/HDL Ratio: 3.4 ratio (ref 0.0–4.4)
Cholesterol, Total: 148 mg/dL (ref 100–199)
HDL: 44 mg/dL (ref >39)
LDL Chol Calc (NIH): 82 mg/dL (ref 0–99)
Triglycerides: 125 mg/dL (ref 0–149)
VLDL Cholesterol Cal: 22 mg/dL (ref 5–40)

## 2022-01-06 LAB — CARDIOVASCULAR RISK ASSESSMENT

## 2022-01-06 LAB — HEMOGLOBIN A1C
Est. average glucose Bld gHb Est-mCnc: 154 mg/dL
Hgb A1c MFr Bld: 7 % — ABNORMAL HIGH (ref 4.8–5.6)

## 2022-01-09 ENCOUNTER — Ambulatory Visit (INDEPENDENT_AMBULATORY_CARE_PROVIDER_SITE_OTHER): Payer: Medicare HMO | Admitting: Family Medicine

## 2022-01-09 VITALS — BP 130/60 | HR 60 | Temp 98.6°F | Resp 14 | Ht 67.0 in | Wt 200.0 lb

## 2022-01-09 DIAGNOSIS — I130 Hypertensive heart and chronic kidney disease with heart failure and stage 1 through stage 4 chronic kidney disease, or unspecified chronic kidney disease: Secondary | ICD-10-CM | POA: Diagnosis not present

## 2022-01-09 DIAGNOSIS — K5904 Chronic idiopathic constipation: Secondary | ICD-10-CM

## 2022-01-09 DIAGNOSIS — M791 Myalgia, unspecified site: Secondary | ICD-10-CM | POA: Diagnosis not present

## 2022-01-09 DIAGNOSIS — E1140 Type 2 diabetes mellitus with diabetic neuropathy, unspecified: Secondary | ICD-10-CM

## 2022-01-09 DIAGNOSIS — Z6831 Body mass index (BMI) 31.0-31.9, adult: Secondary | ICD-10-CM

## 2022-01-09 DIAGNOSIS — E039 Hypothyroidism, unspecified: Secondary | ICD-10-CM | POA: Diagnosis not present

## 2022-01-09 DIAGNOSIS — M1A9XX0 Chronic gout, unspecified, without tophus (tophi): Secondary | ICD-10-CM | POA: Diagnosis not present

## 2022-01-09 DIAGNOSIS — N1832 Chronic kidney disease, stage 3b: Secondary | ICD-10-CM | POA: Diagnosis not present

## 2022-01-09 DIAGNOSIS — K219 Gastro-esophageal reflux disease without esophagitis: Secondary | ICD-10-CM

## 2022-01-09 DIAGNOSIS — E6609 Other obesity due to excess calories: Secondary | ICD-10-CM

## 2022-01-09 DIAGNOSIS — T466X5A Adverse effect of antihyperlipidemic and antiarteriosclerotic drugs, initial encounter: Secondary | ICD-10-CM

## 2022-01-09 DIAGNOSIS — Z794 Long term (current) use of insulin: Secondary | ICD-10-CM

## 2022-01-09 MED ORDER — TRAMADOL HCL 50 MG PO TABS: 50.0000 mg | ORAL_TABLET | Freq: Four times a day (QID) | ORAL | 1 refills | Status: DC | PRN

## 2022-01-09 MED ORDER — COLCHICINE 0.6 MG PO TABS
0.6000 mg | ORAL_TABLET | Freq: Two times a day (BID) | ORAL | 1 refills | Status: DC | PRN
Start: 2022-01-09 — End: 2023-01-28

## 2022-01-09 NOTE — Progress Notes (Signed)
Subjective:  Patient ID: Carol [name], female    DOB: 20-Oct-1935  Age: 86 y.o. MRN: [id number]  Chief Complaint  Patient presents with   Diabetes    HPI CORONARY ARTERY DISEASE/CONGESTIVE HEART FAILURE/hypertension/CKD stage 3b: Stents. 3 heart attacks. CABG. MCHG: Dr. Tamala Julian. On zetia 10 mg daily, plavix 75 mg daily, Imdur 60 mg daily, Carvedilol 6.25 mg one twice daily, and hydralazine 25 mg twice daily, and lasix 80 mg daily. Patient saw Dr. Tamala Julian and is scheduled for echocardiogram.  Intolerant to statins.   B12 deficiency: Last B12 was > 2000. Patient held B12.    Diabetes: Diabetes: neuropathy.  Last A1C was 8.0. Medications: Novolin 70/30 to 20 U before breakfast and none at Supper unless over 200 and then takes 12 U.  Sugars in am 100-260 CGM: free style libre.  Checking feet for sores.   Constipation(6 week follow up): resolved for the most part. Occasionally has to take miralax or linzess.  Neuropathy: takes tramadol 50 mg four times a day as needed severe  'pain. On gabapentin 300 mg twice daily.   Hyperlipidemia: on zetia 10 mg daily,   GERD: on pantoprazole 40 mg  daily and pepcid 10 mg 2 times daily as needed.   Gout: uses colchicine twice daily as needed. ON allopurinol 300 mg ad.   Hypothyroidism: on levothyroxine 50 mcg once daily in am.   Current Outpatient Medications on File Prior to Visit  Medication Sig Dispense Refill   calcium carbonate (OSCAL) 1500 (600 Ca) MG TABS tablet Take 600 mg by mouth 2 (two) times daily with a meal.     albuterol (VENTOLIN HFA) 108 (90 Base) MCG/ACT inhaler Inhale into the lungs.     Alcohol Swabs (ALCOHOL WIPES) 70 % PADS USE ONE PAD TO CLEAN AREA TO CHECK BLOOD SUGAR AS  DIRECTED     allopurinol (ZYLOPRIM) 300 MG tablet Take 300 mg by mouth every morning.      Biotin 5000 MCG CAPS Take 5,000 mcg by mouth every morning.      Blood Glucose Monitoring Suppl (TRUE METRIX METER) DEVI Use to test blood sugars once daily.  ICD: E11.65     carvedilol (COREG) 6.25 MG tablet Take 6.25 mg by mouth 2 (two) times daily with a meal.     clopidogrel (PLAVIX) 75 MG tablet Take 1 tablet (75 mg total) by mouth daily. 90 tablet 0   Continuous Blood Gluc Receiver (FREESTYLE LIBRE READER) DEVI 1 each by Does not apply route as directed. 1 each 0   ezetimibe (ZETIA) 10 MG tablet Take 10 mg by mouth daily.     famotidine (PEPCID) 10 MG tablet Take 10 mg by mouth 2 (two) times daily as needed for heartburn or indigestion.      fluticasone (FLONASE) 50 MCG/ACT nasal spray USE 1 SPRAY NASALLY EVERY DAY     furosemide (LASIX) 80 MG tablet Take 80 mg by mouth daily.     gabapentin (NEURONTIN) 300 MG capsule Take 300 mg by mouth 2 (two) times daily.     glucose blood (TRUE METRIX BLOOD GLUCOSE TEST) test strip Use as instructed 100 each 12   hydrALAZINE (APRESOLINE) 25 MG tablet Take 25 mg by mouth 2 (two) times daily.     insulin NPH-regular Human (NOVOLIN 70/30) (70-30) 100 UNIT/ML injection Inject 25-32 Units into the skin daily. Reports 20 units in morning and 10-12 at night if needed.     isosorbide mononitrate (IMDUR) 60 MG 24 hr  tablet Take 1 tablet (60 mg total) by mouth daily. 90 tablet 3   levothyroxine (SYNTHROID, LEVOTHROID) 50 MCG tablet Take 50 mcg by mouth every morning.      linaclotide (LINZESS) 72 MCG capsule Take 1 capsule (72 mcg total) by mouth daily before breakfast. (Patient not taking: Reported on 01/09/2022) 30 capsule 2   lisinopril (ZESTRIL) 5 MG tablet Take 5 mg by mouth daily.     methocarbamol (ROBAXIN) 500 MG tablet Take 1-2 tablets (500-1,000 mg total) by mouth every 6 (six) hours as needed for muscle spasms. 60 tablet 0   nitroGLYCERIN (NITROSTAT) 0.4 MG SL tablet Place 0.4 mg under the tongue as needed.     pantoprazole (PROTONIX) 40 MG tablet TAKE 1 TABLET BY MOUTH ONCE DAILY 30 tablet 10   potassium chloride (KLOR-CON) 10 MEQ tablet Take 10 mEq by mouth daily.     TRADJENTA 5 MG TABS tablet Take 5 mg  by mouth daily.     TRUEplus Lancets 33G MISC Use to test blood sugar once daily. ICD: E11.65     No current facility-administered medications on file prior to visit.   Past Medical History:  Diagnosis Date   Anemia    a. Noted on labs 02/2014 possibly procedurally related.   Asthma    CAD S/P percutaneous coronary angioplasty 01/22/2014   A. (12/2013): POBA - OM2 99% (very tortuous segment) - reduced ~ 50% & TIMI 3 flow, 80-90% stenosis in mid PDA, Irregularities <50% in mRCA, pLAD and prox LCx; b. 02/17/14: Moderate sized fixed defect in Lateral wall --> cath with OM2 restenosis & otherwise stable --> Xience Alpine DES x 2 (2.25 mm x 12 & 8 mm). c. NSTEMI 02/2014 s/p PTCA/balloon angioplasty only to small caliber PDA.   Carotid stenosis    Carotid US 11/17: L 1-39; FU prn   Chronic systolic heart failure ([place])    a. ICM b. ECHO (01/2014): EF 25-30%, grade I DD, trivial MR. c. EF by Myoview 02/17/14 = ~53%. d. EF by cath 02/24/14 - improved to 50-55%.   CKD (chronic kidney disease), stage III (HCC)    Contrast media allergy    Environmental allergies    GERD (gastroesophageal reflux disease)    Gout, unspecified    HOH (hard of hearing)    Hyperlipidemia    a. H/o intolerance to Zocor, Lipitor. Had muscle aches on higher dose Crestor.   Hypertension    Hypothyroidism    LBBB (left bundle branch block)    a. Transient during 02/2014 admission.   NSTEMI (non-ST elevated myocardial infarction) ([place]) 2014; 12/2013, 01/2014   QT prolongation    a. Noted on EKG 02/2014.   Sinus bradycardia    a. HR 40s-50s during 02/2014 admission, limiting BB dose.   TIA (transient ischemic attack) 1990's   a. TIA vs stroke 1990's "mild" - sounds like a TIA as she was told she had stroke symptoms but negative scans. Denies residual effects.   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   [place] CATHETERIZATION  02/24/2014   Procedure: CORONARY BALLOON ANGIOPLASTY;  Surgeon: Burnell Blanks, MD;  Location[facility] CATH LAB;  Service: Cardiovascular;;   CARDIAC CATHETERIZATION N/A 04/20/2015   Procedure: Left Heart Cath and Coronary Angiography;  Surgeon: Belva Crome, MD;  Location:  [place] INVASIVE CV LAB;  Service: Cardiovascular;  Laterality: N/A;   CATARACT EXTRACTION W/ INTRAOCULAR LENS  IMPLANT, BILATERAL Bilateral 2013   CESAREAN SECTION  1959; [place]  12/2013   POBA - OM2   CORONARY ARTERY BYPASS GRAFT N/A 04/25/2015   Procedure: CORONARY ARTERY BYPASS GRAFTING (CABG) x four,  using left internal mammary artery and right leg greater saphenous vein harvested endoscopically;  Surgeon: Gaye Pollack, MD;  Location: [place];  Service: Open Heart Surgery;  Laterality: N/A;   JOINT REPLACEMENT Right 10 years ago    right knee (TKR)   LEFT HEART CATHETERIZATION WITH CORONARY ANGIOGRAM N/A 01/22/2014   Procedure: LEFT HEART CATHETERIZATION WITH CORONARY ANGIOGRAM;  Surgeon: Sinclair Grooms, MD;  Location: [facility] CATH LAB;  Service: Cardiovascular;  Laterality: N/A;   LEFT HEART CATHETERIZATION WITH CORONARY ANGIOGRAM N/A 02/17/2014   Procedure: LEFT HEART CATHETERIZATION WITH CORONARY ANGIOGRAM;  Surgeon: Burnell Blanks, MD;  Location: [facility] CATH LAB;  Service: Cardiovascular;  Laterality: N/A;   LEFT HEART CATHETERIZATION WITH CORONARY ANGIOGRAM N/A 02/24/2014   Procedure: LEFT HEART CATHETERIZATION WITH CORONARY ANGIOGRAM;  Surgeon: Burnell Blanks, MD;  Location: [facility] CATH LAB;  Service: Cardiovascular;  Laterality: N/A;   LUMBAR LAMINECTOMY/DECOMPRESSION MICRODISCECTOMY Left 12/22/2014   Procedure: CENTRAL DECOMPRESSIVE LUMBAR, AND LAMINECTOMY L5-S1,  S1-S2 FOR SPINAL STENOSIS FORAMINOTOMY L5, S1, S2 ROOTS ON LEFT FOR FORAMINAL STENOSIS, AND DECOMPRESSION L5-S1, S1-S2 ACCORDING TO LABELING OF  [place] X-RAYS;  Surgeon: Latanya Maudlin, MD;  Location: [facility];  Service: Orthopedics;  Laterality: Left;   PERCUTANEOUS CORONARY STENT INTERVENTION (PCI-S)  02/17/2014   For restenosis of OM2 - PCI with Xience Apline DES 2.25 mm x 12 mm & 2.25 mm x 8 mm overlapping   REDUCTION MAMMAPLASTY Bilateral 1990's   SHOULDER OPEN ROTATOR CUFF REPAIR Bilateral 1980's - 1990's   TEE WITHOUT CARDIOVERSION N/A 04/25/2015   Procedure: TRANSESOPHAGEAL ECHOCARDIOGRAM (TEE);  Surgeon: Gaye Pollack, MD;  Location: [place];  Service: Open Heart Surgery;  Laterality: N/A;   TOTAL KNEE ARTHROPLASTY Right 2009   TOTAL KNEE ARTHROPLASTY Left 04/21/2018   TKR;  Surgeon: Vickey Huger, MD;  Location: [facility];  Service: Orthopedics;  Laterality: Left;  Adductor Block    Family History  Problem Relation Age of Onset   Hypertension Mother    Diabetes Mother    Heart attack Mother 47   Heart disease Father    Emphysema Father    Diabetes Brother    Heart disease Brother    Cancer Sister    Stroke Neg Hx    Social History   Socioeconomic History   Marital status: Widowed    Spouse name: Not on file   Number of children: 2   Years of education: Not on file   Highest education level: Not on file  Occupational History   Occupation: Retired  Tobacco Use   Smoking status: Never   Smokeless tobacco: Never  Vaping Use   Vaping Use: Never used  Substance and Sexual Activity   Alcohol use: No   Drug use: No   Sexual activity: Never  Other Topics Concern   Not on file  Social History Narrative   Lives in [place] by herself.    Social Determinants of Health   Financial Resource Strain: Low Risk  (10/06/2021)   Overall Financial Resource Strain (CARDIA)    Difficulty of Paying Living Expenses: Not hard at all  Food Insecurity: No Food Insecurity (10/06/2021)   Hunger Vital Sign    Worried About Running Out of Food in the Last Year: Never true    Ran Out of Food in the Last Year: Never true   Transportation Needs: No Transportation Needs (10/06/2021)   PRAPARE - Hydrologist (Medical): No    Lack of Transportation (Non-Medical): No  Physical Activity: Sufficiently Active (10/06/2021)   Exercise Vital Sign    Days of Exercise per Week: 6 days    Minutes of Exercise per Session: 40 min  Stress: No Stress Concern Present (10/06/2021)   [place]    Feeling of Stress : Not at all  Social Connections: Not on file    Review of Systems  Constitutional:  Negative for chills, fatigue and fever.  HENT:  Negative for congestion, rhinorrhea and sore throat.   Respiratory:  Negative for cough and shortness of breath.   Cardiovascular:  Negative for chest pain.  Gastrointestinal:  Negative for abdominal pain, constipation, diarrhea, nausea and vomiting.  Genitourinary:  Negative for dysuria and urgency.  Musculoskeletal:  Negative for back pain and myalgias.  Neurological:  Positive for light-headedness, numbness (feet tingling) and headaches. Negative for dizziness and weakness.  Psychiatric/Behavioral:  Negative for dysphoric mood. The patient is not nervous/anxious.      Objective:  BP 130/60   Pulse 60   Temp 98.6 F (37 C)   Resp 14   Ht '5\' 7"'$  (1.702 m)   Wt 200 lb (90.7 kg)   BMI 31.32 kg/m      01/14/2022    8:46 PM 01/11/2022    3:41 PM 12/22/2021    8:43 AM  BP/Weight  Systolic BP 924  268  Diastolic BP 60  60  Wt. (Lbs) 200 200 200.4  BMI 31.32 kg/m2 31.32 kg/m2 31.39 kg/m2    Physical Exam Vitals reviewed.  Constitutional:      Appearance: Normal appearance. She is obese.  Neck:     Vascular: No carotid bruit.  Cardiovascular:     Rate and Rhythm: Normal rate and regular rhythm.     Heart sounds: Normal heart sounds.  Pulmonary:     Effort: Pulmonary effort is normal. No respiratory distress.     Breath sounds: Normal breath sounds.  Abdominal:      General: Abdomen is flat. Bowel sounds are normal.     Palpations: Abdomen is soft.     Tenderness: There is no abdominal tenderness.  Musculoskeletal:     Comments: Negative tinels and phalens BL.  Neurological:     Mental Status: She is alert and oriented to person, place, and time.  Psychiatric:        Mood and Affect: Mood normal.        Behavior: Behavior normal.     Diabetic Foot Exam - Simple   Simple Foot Form  01/09/2022  9:53 PM  Visual Inspection See comments: Yes Sensation Testing See comments: Yes Pulse Check Posterior Tibialis and Dorsalis pulse intact bilaterally: Yes Comments Decreased sensation in BL feet.  Right 2nd toe amputation.      Lab Results  Component Value Date   WBC 7.1 01/05/2022   HGB 11.8 01/05/2022   HCT 35.0 01/05/2022   PLT 201 01/05/2022   GLUCOSE 94 01/05/2022   CHOL 148 01/05/2022   TRIG 125 01/05/2022   HDL 44 01/05/2022   LDLCALC 82 01/05/2022  ALT 10 01/05/2022   AST 14 01/05/2022   NA 141 01/05/2022   K 5.0 01/05/2022   CL 102 01/05/2022   CREATININE 1.40 (H) 01/05/2022   BUN 35 (H) 01/05/2022   CO2 26 01/05/2022   TSH 3.250 09/29/2021   INR 0.94 04/21/2018   HGBA1C 7.0 (H) 01/05/2022   MICROALBUR 2.1 (H) 07/26/2014      Assessment & Plan:   Problem List Items Addressed This Visit       Cardiovascular and Mediastinum   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  Continue On zetia 10 mg daily, plavix 75 mg daily, Imdur 60 mg daily, Carvedilol 6.25 mg one twice daily, and hydralazine 25 mg twice daily, and lasix 80 mg daily.       Relevant Medications   hydrALAZINE (APRESOLINE) 25 MG tablet   lisinopril (ZESTRIL) 5 MG tablet     Digestive   GERD (gastroesophageal reflux disease) - Primary    The current medical regimen is effective;  continue present plan and medications. Continue protonix and pepcid.       Chronic idiopathic constipation    Continue miralax as needed.         Endocrine   Acquired hypothyroidism   Controlled type 2 diabetes mellitus with diabetic neuropathy, with long-term current use of insulin (HCC)    Control: good Recommend check sugars fasting daily. Recommend check feet daily. Recommend annual eye exams. Medicines: Continue Novolin 70/30 to 20 U before breakfast and none at Supper unless over 200 and then takes 12 U.  Continue to work on eating a healthy diet and exercise.  Labs drawn today.         Relevant Medications   lisinopril (ZESTRIL) 5 MG tablet     Genitourinary   Stage 3b chronic kidney disease (HCC)    Stable.        Other   Gout, unspecified    The current medical regimen is effective;  continue present plan and medications.       Myalgia due to statin    Intolerant to statins.      Class 1 obesity due to excess calories with serious comorbidity and body mass index (BMI) of 31.0 to 31.9 in adult    Recommend continue to work on eating healthy diet and exercise.      .  Meds ordered this encounter  Medications   DISCONTD: traMADol (ULTRAM) 50 MG tablet    Sig: Take 1 tablet (50 mg total) by mouth every 6 (six) hours as needed.    Dispense:  30 tablet    Refill:  1   colchicine 0.6 MG tablet    Sig: Take 1 tablet (0.6 mg total) by mouth 2 (two) times daily as needed for up to 7 days (gout flare up.).    Dispense:  30 tablet    Refill:  1   traMADol (ULTRAM) 50 MG tablet    Sig: Take 1 tablet (50 mg total) by mouth every 6 (six) hours as needed.    Dispense:  30 tablet    Refill:  1   Follow-up: Return in about 3 months (around 04/11/2022) for chronic follow up.  An After Visit Summary was printed and given to the patient.  Rochel Brome, MD [facility] [phone]

## 2022-01-10 ENCOUNTER — Other Ambulatory Visit (HOSPITAL_COMMUNITY): Payer: Medicare HMO

## 2022-01-11 ENCOUNTER — Ambulatory Visit (INDEPENDENT_AMBULATORY_CARE_PROVIDER_SITE_OTHER): Payer: Medicare HMO

## 2022-01-11 ENCOUNTER — Ambulatory Visit: Payer: Medicare HMO | Admitting: Interventional Cardiology

## 2022-01-11 VITALS — Wt 200.0 lb

## 2022-01-11 DIAGNOSIS — I10 Essential (primary) hypertension: Secondary | ICD-10-CM

## 2022-01-11 DIAGNOSIS — I5042 Chronic combined systolic (congestive) and diastolic (congestive) heart failure: Secondary | ICD-10-CM

## 2022-01-11 NOTE — Patient Instructions (Signed)
Visit Information  Thank you for taking time to visit with me today. Please don't hesitate to contact me if I can be of assistance to you before our next scheduled telephone appointment.  Following are the goals we discussed today:  Follow up with MD as planned. Continue to take all medications as prescribed.   If you are experiencing a Mental Health or [place] or need someone to talk to, please call the Suicide and Crisis Lifeline: 988 call the [place] National Suicide Prevention Lifeline: 219 236 9616 or TTY: 531 074 9570 TTY 814-134-4204) to talk to a trained counselor call 1-800-273-TALK (toll free, 24 hour hotline) call 911   Patient verbalizes understanding of instructions and care plan provided today and agrees to view in [place]. Active MyChart status and patient understanding of how to access instructions and care plan via MyChart confirmed with patient.     Tomasa Rand RN, BSN, CEN RN Case Freight forwarder - [employer]/gallery exhibitions officer Mobile: [phone]

## 2022-01-11 NOTE — Chronic Care Management (AMB) (Signed)
Chronic Care Management   CCM RN Visit Note  01/11/2022 Name: Carol [name] MRN: [id number] DOB: 22-Apr-1936  Subjective: Carol [name] is a 86 y.o. year old female who is a primary care patient of Cox, Kirsten, MD. The care management team was consulted for assistance with disease management and care coordination needs.    Engaged with patient by telephone for follow up visit in response to provider referral for case management and/or care coordination services.   Consent to Services:  The patient was given information about Chronic Care Management services, agreed to services, and gave verbal consent prior to initiation of services.  Please see initial visit note for detailed documentation.   Patient agreed to services and verbal consent obtained.   Assessment: Review of patient past medical history, allergies, medications, health status, including review of consultants reports, laboratory and other test data, was performed as part of comprehensive evaluation and provision of chronic care management services.   SDOH (Social Determinants of Health) assessments and interventions performed:    CCM Care Plan  Allergies  Allergen Reactions   Iodinated Contrast Media Other (See Comments) and Anaphylaxis    unknown Contrast agent    Atorvastatin Other (See Comments)    Muscle aches- severe Muscle aches- severe Muscle aches- severe Muscle aches- severe   Corticosteroids Other (See Comments)    Elevated blood sugar  Elevated blood sugar   Hydrocodone Nausea And Vomiting   Other Nausea And Vomiting    Pain medications except can tramadol   Oxycodone Nausea And Vomiting   Simvastatin Other (See Comments)    Muscle aches- severe Muscle aches- severe Muscle aches- severe Muscle aches- severe   Cortizone-10 [Hydrocortisone] Other (See Comments)    unknown    Outpatient Encounter Medications as of 01/11/2022  Medication Sig Note   albuterol (VENTOLIN HFA) 108 (90 Base)  MCG/ACT inhaler Inhale into the lungs.    Alcohol Swabs (ALCOHOL WIPES) 70 % PADS USE ONE PAD TO CLEAN AREA TO CHECK BLOOD SUGAR AS  DIRECTED    allopurinol (ZYLOPRIM) 300 MG tablet Take 300 mg by mouth every morning.     Biotin 5000 MCG CAPS Take 5,000 mcg by mouth every morning.     Blood Glucose Monitoring Suppl (TRUE METRIX METER) DEVI Use to test blood sugars once daily. ICD: E11.65    calcium carbonate (OSCAL) 1500 (600 Ca) MG TABS tablet Take 600 mg by mouth 2 (two) times daily with a meal.    carvedilol (COREG) 6.25 MG tablet Take 6.25 mg by mouth 2 (two) times daily with a meal.    clopidogrel (PLAVIX) 75 MG tablet Take 1 tablet (75 mg total) by mouth daily.    colchicine 0.6 MG tablet Take 1 tablet (0.6 mg total) by mouth 2 (two) times daily as needed for up to 7 days (gout flare up.).    Continuous Blood Gluc Receiver (FREESTYLE LIBRE READER) DEVI 1 each by Does not apply route as directed.    ezetimibe (ZETIA) 10 MG tablet Take 10 mg by mouth daily.    famotidine (PEPCID) 10 MG tablet Take 10 mg by mouth 2 (two) times daily as needed for heartburn or indigestion.     fluticasone (FLONASE) 50 MCG/ACT nasal spray USE 1 SPRAY NASALLY EVERY DAY    furosemide (LASIX) 80 MG tablet Take 80 mg by mouth daily.    gabapentin (NEURONTIN) 300 MG capsule Take 300 mg by mouth 2 (two) times daily.    glucose blood (TRUE  METRIX BLOOD GLUCOSE TEST) test strip Use as instructed    insulin NPH-regular Human (NOVOLIN 70/30) (70-30) 100 UNIT/ML injection Inject 25-32 Units into the skin daily. Reports 20 units in morning and 10-12 at night if needed.    isosorbide mononitrate (IMDUR) 60 MG 24 hr tablet Take 1 tablet (60 mg total) by mouth daily.    levothyroxine (SYNTHROID, LEVOTHROID) 50 MCG tablet Take 50 mcg by mouth every morning.     linaclotide (LINZESS) 72 MCG capsule Take 1 capsule (72 mcg total) by mouth daily before breakfast. (Patient not taking: Reported on 01/09/2022)    methocarbamol  (ROBAXIN) 500 MG tablet Take 1-2 tablets (500-1,000 mg total) by mouth every 6 (six) hours as needed for muscle spasms.    nitroGLYCERIN (NITROSTAT) 0.4 MG SL tablet Place 0.4 mg under the tongue as needed.    pantoprazole (PROTONIX) 40 MG tablet TAKE 1 TABLET BY MOUTH ONCE DAILY 11/21/2021: Reports takes as needed   potassium chloride (KLOR-CON) 10 MEQ tablet Take 10 mEq by mouth daily.    TRADJENTA 5 MG TABS tablet Take 5 mg by mouth daily.    traMADol (ULTRAM) 50 MG tablet Take 1 tablet (50 mg total) by mouth every 6 (six) hours as needed.    TRUEplus Lancets 33G MISC Use to test blood sugar once daily. ICD: E11.65    No facility-administered encounter medications on file as of 01/11/2022.    Patient Active Problem List   Diagnosis Date Noted   Dizziness 11/10/2021   Myalgia 11/10/2021   Osteoporosis screening 10/03/2021   Myalgia due to statin 10/03/2021   Hypertensive heart and renal disease 10/03/2021   Other fatigue 10/03/2021   Chronic idiopathic constipation 09/28/2021   S/P TKR (total knee replacement) using cement, left 04/21/2018   Hx of CABG 04/25/2015   Chronic combined systolic and diastolic heart failure ([place]) 04/19/2015   GERD (gastroesophageal reflux disease) 04/19/2015   Spinal stenosis, lumbar region, with neurogenic claudication 12/22/2014   Diabetes ([place]) 04/10/2014   Asthma, moderate persistent 03/23/2014   Post-infarction angina ([place]) 02/17/2014   Abnormal nuclear stress test: Intermediate risk 02/17/2014   Presence of drug coated stent in left circumflex coronary artery: OM2 - Xience Alpine DES 2.25 mm x 12, 2.25 mm x 8 mm overlap 02/17/2014    Class: Acute   Ischemic cardiomyopathy - with near resolution of LVEF post MI 02/15/2014   Chronic kidney disease (CKD), stage III (moderate) ([place]) 02/15/2014   Diabetes mellitus due to underlying condition, uncontrolled, with diabetic neuropathy, with long-term current use of insulin 02/01/2014   Left-sided chest pain  01/22/2014   Hyperlipidemia 01/22/2014   History of non-ST elevation myocardial infarction (NSTEMI) 01/22/2014   CAD in native artery 01/22/2014   Mixed conductive and sensorineural hearing loss 12/05/2012   Essential hypertension 12/05/2012   Acquired hypothyroidism 12/05/2012   Lower back pain 12/05/2012   Gout, unspecified     Conditions to be addressed/monitored:CHF, HTN, HLD, and DMII  Care Plan : RN Care Manager Plan of Care  Updates made by Thana Ates, RN since 01/11/2022 12:00 AM     Problem: Disease Management and Care Coordination Needs(DM, CAD, CHF, HTN, HLD)   Priority: High     Long-Range Goal: Establish Plan of Care for Care Coordination and Disease Management Needs(DM, CAD, CHF, HTN, HLD)   Start Date: 10/06/2021  Expected End Date: 10/06/2022  Priority: High  Note:   Current Barriers:  Knowledge Deficits related to plan of care for management  of CHF, CAD, HTN, HLD, and DMII  Chronic Disease Management support and education needs related to CHF, CAD, HTN, HLD, and DMII  States she wants to get her diabetes under better control.  States she has not been eating as healthy as she should and eats a lot of crackers.  States this last week she has started eating more vegetables.  States that while she was getting unpacked from her move she was not looking after herself as she should.  States that she checks her CBG 1-2 times a day and it was 117 this morning and has ranged from 117-200 this last week.  States she was not able to use the [place] because her phone was not compatible and was told the reader is very expensive. Denies any chest pains, increased swelling or shortness of breath.  States she has not been weighing daily or checking her B/P at home recently. 11/21/21 RNCM called to follow up. Ms. Lanigan reports her blood sugar this morning was 57. She states she can tell when her blood sugar is low-she reports she treated by eating a milky way candy bar, then ate  breakfast: Corn Flakes with Bananas, coffee and gram crackers. She states she did not recheck her blood sugar, but could tell it was no longer low. She reports she usually checks her blood sugar 3-4 times/day and will check before lunch. She reports her blood sugar last night was 165 (ate bedtime snack and gave herself a little insulin). She reports taking 70/30 insulin and states she will take 20 units in the morning and the rest(10-12 units at night if needed). She states she is awaiting the Freestyle Libre to come in the mail. She states she checked blood pressure last night and does not remember exactly what it was she reports it was around 130/60. She states she continues to weigh self daily, but does not  record her weights and denies any signs/symptoms of Heart failure exacerbation. She is without questions or concerns at this time. 12/21/2021  Spoke with patient who reports that she is dong well.  DM: reports CBG after lunch today of 160.  Reports she loves her new freestyle libre.  Report no recent low readings.  Reports feet are good. Denies any recent low CBG readings. Reports she is monitoring atleast 4 times per day. Reports she had been on prednisone the last time before she had her A1c checked.  HTN: Patient reports she is self monitoring and does not remember her reading today. Reports her blood pressure has been normal.  Reports she continues to follow a low salt diet. CHF: denies any swelling. Reports her weight is unchanged.  Hearing: reports to me that she is very hard of hearing. Reports she has new hearing aids but it hasn't help much.   01/11/2022 Spoke with patient today and she reports she is doing well. Noted to have difficultly hearing on the phone. Patient report her CBG this am was 101.  States 200 now because she has been eating watermelon and peaches. Reports she enjoys fresh fruit.  Patient reports that she walks about 1 mile per day. States she walks a half mile first thing in the  am and late in the evening.  Reports no swelling. States BP is good. Reports no new concerns today.   RNCM Clinical Goal(s):  Patient will verbalize understanding of plan for management of CHF, CAD, HTN, HLD, and DMII as evidenced by voiced adherence to plan of care verbalize basic  understanding of  CHF, CAD, HTN, HLD, and DMII disease process and self health management plan as evidenced by voiced understanding and teach back  take all medications exactly as prescribed and will call provider for medication related questions as evidenced by dispense report and pt verbalization  attend all scheduled medical appointments: Dr. Tobie Poet 11/09/21, cardiology 01/11/22 as evidenced by medical records demonstrate Improved adherence to prescribed treatment plan for CHF, CAD, HTN, HLD, and DMII as evidenced by readings within limits, voiced adherence to plan of care continue to work with RN Care Manager to address care management and care coordination needs related to  CHF, CAD, HTN, HLD, and DMII as evidenced by adherence to CM Team Scheduled appointments through collaboration with RN Care manager, provider, and care team.   Interventions: 1:1 collaboration with primary care provider regarding development and update of comprehensive plan of care as evidenced by provider attestation and co-signature Inter-disciplinary care team collaboration (see longitudinal plan of care) Evaluation of current treatment plan related to  self management and patient's adherence to plan as established by provider [facility]  calendar  CAD Interventions: (Status:  Goal Met.) Long Term Goal Assessed understanding of CAD diagnosis Medications reviewed and encouraged to continue to take as recommended  Reviewed Importance of taking all medications as prescribed Encouraged to attend provider visits as scheduled  Heart Failure Interventions:  (Status:  Goal Met.) Long Term Goal Today's Vitals   01/11/22 1541   Weight: 200 lb (90.7 kg)  PainSc: 0-No pain   Discussed importance of daily weight and advised patient to weigh and record daily Medications reviewed and encouraged to take as recommended Reviewed signs/symptoms of Heart Failure exacerbation Reinforced the importance of recording daily weights in management of heart failure  Diabetes Interventions:  (Status:  Goal Met.) Long Term Goal Assessed patient's understanding of A1c goal: <7% Reviewed medications with patient and discussed importance of medication adherence Provided patient with written educational materials related to hypo and hyperglycemia and importance of correct treatment Advised patient, providing education and rationale, to check cbg 2-3 times a day and record, calling provider for findings outside established parameters Encouraged patient to continue to self monitor and call MD for high or low readings.  Lab Results  Component Value Date   HGBA1C 7.0 (H) 01/05/2022   Pending labs on 01/05/2022  Reviewed with patient to continue to take her medications as prescribed and self monitor   Hyperlipidemia Interventions:  (Status:  Goal Met.) Long Term Goal Medication review performed; medication list updated in electronic medical record.  Provider established cholesterol goals reviewed Counseled on importance of regular laboratory monitoring as prescribed Reviewed importance of limiting foods high in cholesterol  Hypertension Interventions:  (Status:  Goal Met.) Long Term Goal Last practice recorded BP readings:  BP Readings from Last 3 Encounters:  12/22/21 130/60  12/06/21 134/68  11/09/21 (!) 146/60  Most recent eGFR/CrCl:  Lab Results  Component Value Date   EGFR 37 (L) 01/05/2022      Evaluation of current treatment plan related to hypertension self management and patient's adherence to plan as established by provider Reviewed medications with patient and discussed importance of compliance Advised patient to  discuss Target blood pressure range with provider Provided education on prescribed diet low sodium, low CHO heart healthy Encouraged patient to continue to self monitor, take medications as prescribed and follow her low salt diet.   Patient Goals/Self-Care Activities: Take all medications as prescribed Attend all scheduled provider appointments Call  pharmacy for medication refills 3-7 days in advance of running out of medications Perform all self care activities independently  Call provider office for new concerns or questions  Continue to weight daily and begin to record your weights and take with you to provider visits call office if I gain more than 2 pounds in one day or 5 pounds in one week watch for swelling in feet, ankles and legs every day weigh myself daily follow rescue plan if symptoms flare-up eat more whole grains, fruits and vegetables, lean meats and healthy fats keep appointment with eye doctor check blood sugar at prescribed times: three times daily and when you have symptoms of low or high blood sugar check feet daily for cuts, sores or redness enter blood sugar readings and medication or insulin into daily log drink 6 to 8 glasses of water each day fill half of plate with vegetables manage portion size check blood pressure 3 times per week choose a place to take my blood pressure (home, clinic or office, retail store) write blood pressure results in a log or diary limit salt intake to 2363m/day Continue to be as active as possible per provider recommendation      Case close. Goals met. Plan:No further follow up required: with this case manager. Let MD know if you need assistance in the  future.  ATomasa RandRN, BSN, CCareers information officerfor APerformance Food GroupMobile: [phone]

## 2022-01-14 ENCOUNTER — Encounter: Payer: Self-pay | Admitting: Family Medicine

## 2022-01-14 DIAGNOSIS — E6609 Other obesity due to excess calories: Secondary | ICD-10-CM | POA: Insufficient documentation

## 2022-01-14 NOTE — Assessment & Plan Note (Addendum)
The current medical regimen is effective;  continue present plan and medications.  Continue On zetia 10 mg daily, plavix 75 mg daily, Imdur 60 mg daily, Carvedilol 6.25 mg one twice daily, and hydralazine 25 mg twice daily, and lasix 80 mg daily.

## 2022-01-14 NOTE — Assessment & Plan Note (Signed)
Continue miralax as needed.

## 2022-01-14 NOTE — Assessment & Plan Note (Signed)
>>  ASSESSMENT AND PLAN FOR HYPERTENSIVE HEART AND RENAL DISEASE WITH CHF (HCC) WRITTEN ON 01/14/2022  9:39 PM BY Keylen Eckenrode, MD  The current medical regimen is effective;  continue present plan and medications.  Continue On zetia 10 mg daily, plavix 75 mg daily, Imdur 60 mg daily, Carvedilol 6.25 mg one twice daily, and hydralazine 25 mg twice daily, and lasix 80 mg daily.

## 2022-01-14 NOTE — Assessment & Plan Note (Signed)
Control: good Recommend check sugars fasting daily. Recommend check feet daily. Recommend annual eye exams. Medicines: Continue Novolin 70/30 to 20 U before breakfast and none at [place] unless over 200 and then takes 12 U.  Continue to work on eating a healthy diet and exercise.  Labs drawn today.

## 2022-01-14 NOTE — Assessment & Plan Note (Signed)
Recommend continue to work on eating healthy diet and exercise.  

## 2022-01-14 NOTE — Assessment & Plan Note (Signed)
Stable

## 2022-01-14 NOTE — Assessment & Plan Note (Signed)
The current medical regimen is effective;  continue present plan and medications. Continue protonix and pepcid.

## 2022-01-14 NOTE — Assessment & Plan Note (Signed)
The current medical regimen is effective;  continue present plan and medications.  

## 2022-01-14 NOTE — Assessment & Plan Note (Signed)
Intolerant to statins. 

## 2022-01-16 ENCOUNTER — Ambulatory Visit (HOSPITAL_COMMUNITY): Payer: Medicare HMO | Attending: Internal Medicine

## 2022-01-16 ENCOUNTER — Ambulatory Visit: Payer: Medicare HMO | Admitting: Pharmacist

## 2022-01-16 DIAGNOSIS — E7800 Pure hypercholesterolemia, unspecified: Secondary | ICD-10-CM | POA: Diagnosis not present

## 2022-01-16 DIAGNOSIS — R0602 Shortness of breath: Secondary | ICD-10-CM | POA: Insufficient documentation

## 2022-01-16 LAB — ECHOCARDIOGRAM COMPLETE
Area-P 1/2: 3.42 cm2
S' Lateral: 3 cm

## 2022-01-16 MED ORDER — ROSUVASTATIN CALCIUM 5 MG PO TABS: 5.0000 mg | ORAL_TABLET | Freq: Every day | ORAL | 3 refills | Status: DC

## 2022-01-16 NOTE — Patient Instructions (Signed)
Your LDL cholesterol is 82 and your goal is < 55  Start taking rosuvastatin (Crestor) '5mg'$  - 1 tablet once daily. If you notice any muscle pain, you can decrease this to 1 tablet every other day  Continue taking your ezetimibe (Zetia) for your cholesterol too  Have your primary care doctor recheck your cholesterol in about 3 months  Call clinic with any concerns [phone]

## 2022-01-16 NOTE — Progress Notes (Signed)
Patient ID: Carol [name]                 DOB: [birth year]                    MRN: [id number]     HPI: Carol [name] is a 86 y.o. female patient referred to lipid clinic by Dr Tamala Julian. PMH is significant for CAD s/p NSTEMI 2014 and 2015, CABG 78/2423, diastolic CHF, HTN, DM, CKD stage III, TIA, LBBB, hyperthyroidism, gout, and asthma.  Pt presents today for follow up. Reports prior muscle aches on simvastatin and atorvastatin. Has been tolerating ezetimibe well.  Current Medications: ezetimibe '10mg'$  daily Intolerances: simvastatin '40mg'$  daily, atorvastatin '80mg'$  daily - myalgias Risk Factors: progressive ASCVD, CHF, HTN, DM, CKD, TIA LDL goal: '55mg'$ /dL  Diet: Likes chicken, eggs, cereal with almond milk, avoids red meat.  Exercise: walks her dog 1/2 mile twice a day  Family History: Cancer in her sister; Diabetes in her brother and mother; Emphysema in her father; Heart attack (age of onset: 46) in her mother; Heart disease in her brother and father; Hypertension in her mother.  Social History: Denies tobacco, alcohol and drug use.  Labs: 01/05/22: TC 148, TG 125, HDL 44, LDL 82 (ezetimibe '10mg'$  daily)  Past Medical History:  Diagnosis Date   Anemia    a. Noted on labs 02/2014 possibly procedurally related.   Asthma    CAD S/P percutaneous coronary angioplasty 01/22/2014   A. (12/2013): POBA - OM2 99% (very tortuous segment) - reduced ~ 50% & TIMI 3 flow, 80-90% stenosis in mid PDA, Irregularities <50% in mRCA, pLAD and prox LCx; b. 02/17/14: Moderate sized fixed defect in Lateral wall --> cath with OM2 restenosis & otherwise stable --> Xience Alpine DES x 2 (2.25 mm x 12 & 8 mm). c. NSTEMI 02/2014 s/p PTCA/balloon angioplasty only to small caliber PDA.   Carotid stenosis    Carotid US 11/17: L 1-39; FU prn   Chronic systolic heart failure ([place])    a. ICM b. ECHO (01/2014): EF 25-30%, grade I DD, trivial MR. c. EF by Myoview 02/17/14 = ~53%. d. EF by cath 02/24/14 - improved to 50-55%.   CKD  (chronic kidney disease), stage III (HCC)    Contrast media allergy    Environmental allergies    GERD (gastroesophageal reflux disease)    Gout, unspecified    HOH (hard of hearing)    Hyperlipidemia    a. H/o intolerance to Zocor, Lipitor. Had muscle aches on higher dose Crestor.   Hypertension    Hypothyroidism    LBBB (left bundle branch block)    a. Transient during 02/2014 admission.   NSTEMI (non-ST elevated myocardial infarction) ([place]) 2014; 12/2013, 01/2014   QT prolongation    a. Noted on EKG 02/2014.   Sinus bradycardia    a. HR 40s-50s during 02/2014 admission, limiting BB dose.   TIA (transient ischemic attack) 1990's   a. TIA vs stroke 1990's "mild" - sounds like a TIA as she was told she had stroke symptoms but negative scans. Denies residual effects.   Type II diabetes mellitus ([place])     Current Outpatient Medications on File Prior to Visit  Medication Sig Dispense Refill   albuterol (VENTOLIN HFA) 108 (90 Base) MCG/ACT inhaler Inhale into the lungs.     Alcohol Swabs (ALCOHOL WIPES) 70 % PADS USE ONE PAD TO CLEAN AREA TO CHECK BLOOD SUGAR AS  DIRECTED     allopurinol (  ZYLOPRIM) 300 MG tablet Take 300 mg by mouth every morning.      Biotin 5000 MCG CAPS Take 5,000 mcg by mouth every morning.      Blood Glucose Monitoring Suppl (TRUE METRIX METER) DEVI Use to test blood sugars once daily. ICD: E11.65     calcium carbonate (OSCAL) 1500 (600 Ca) MG TABS tablet Take 600 mg by mouth 2 (two) times daily with a meal.     carvedilol (COREG) 6.25 MG tablet Take 6.25 mg by mouth 2 (two) times daily with a meal.     clopidogrel (PLAVIX) 75 MG tablet Take 1 tablet (75 mg total) by mouth daily. 90 tablet 0   colchicine 0.6 MG tablet Take 1 tablet (0.6 mg total) by mouth 2 (two) times daily as needed for up to 7 days (gout flare up.). 30 tablet 1   Continuous Blood Gluc Receiver (FREESTYLE LIBRE READER) DEVI 1 each by Does not apply route as directed. 1 each 0   ezetimibe (ZETIA) 10 MG  tablet Take 10 mg by mouth daily.     famotidine (PEPCID) 10 MG tablet Take 10 mg by mouth 2 (two) times daily as needed for heartburn or indigestion.      fluticasone (FLONASE) 50 MCG/ACT nasal spray USE 1 SPRAY NASALLY EVERY DAY     furosemide (LASIX) 80 MG tablet Take 80 mg by mouth daily.     gabapentin (NEURONTIN) 300 MG capsule Take 300 mg by mouth 2 (two) times daily.     glucose blood (TRUE METRIX BLOOD GLUCOSE TEST) test strip Use as instructed 100 each 12   hydrALAZINE (APRESOLINE) 25 MG tablet Take 25 mg by mouth 2 (two) times daily.     insulin NPH-regular Human (NOVOLIN 70/30) (70-30) 100 UNIT/ML injection Inject 25-32 Units into the skin daily. Reports 20 units in morning and 10-12 at night if needed.     isosorbide mononitrate (IMDUR) 60 MG 24 hr tablet Take 1 tablet (60 mg total) by mouth daily. 90 tablet 3   levothyroxine (SYNTHROID, LEVOTHROID) 50 MCG tablet Take 50 mcg by mouth every morning.      linaclotide (LINZESS) 72 MCG capsule Take 1 capsule (72 mcg total) by mouth daily before breakfast. (Patient not taking: Reported on 01/09/2022) 30 capsule 2   lisinopril (ZESTRIL) 5 MG tablet Take 5 mg by mouth daily.     methocarbamol (ROBAXIN) 500 MG tablet Take 1-2 tablets (500-1,000 mg total) by mouth every 6 (six) hours as needed for muscle spasms. 60 tablet 0   nitroGLYCERIN (NITROSTAT) 0.4 MG SL tablet Place 0.4 mg under the tongue as needed.     pantoprazole (PROTONIX) 40 MG tablet TAKE 1 TABLET BY MOUTH ONCE DAILY 30 tablet 10   potassium chloride (KLOR-CON) 10 MEQ tablet Take 10 mEq by mouth daily.     TRADJENTA 5 MG TABS tablet Take 5 mg by mouth daily.     traMADol (ULTRAM) 50 MG tablet Take 1 tablet (50 mg total) by mouth every 6 (six) hours as needed. 30 tablet 1   TRUEplus Lancets 33G MISC Use to test blood sugar once daily. ICD: E11.65     No current facility-administered medications on file prior to visit.    Allergies  Allergen Reactions   Iodinated Contrast  Media Other (See Comments) and Anaphylaxis    unknown Contrast agent    Atorvastatin Other (See Comments)    Muscle aches- severe Muscle aches- severe Muscle aches- severe Muscle aches- severe  Corticosteroids Other (See Comments)    Elevated blood sugar  Elevated blood sugar   Hydrocodone Nausea And Vomiting   Other Nausea And Vomiting    Pain medications except can tramadol   Oxycodone Nausea And Vomiting   Simvastatin Other (See Comments)    Muscle aches- severe Muscle aches- severe Muscle aches- severe Muscle aches- severe   Cortizone-10 [Hydrocortisone] Other (See Comments)    unknown    Assessment/Plan:  1. Hyperlipidemia - LDL 82 on ezetimibe '10mg'$  daily, above goal < 55. She's intolerant to atorvastatin '80mg'$  daily and simvastatin '40mg'$  daily. Discussed that higher doses of lipophilic statins like she took are more likely to cause myalgias. Will rechallenge with lower dose of rosuvastatin '5mg'$  daily. Advised pt if she does develop myalgias to decrease frequency to every other day. Continue ezetimibe. Sees her PCP every 3 months who is more conveniently located, she will have them recheck lab work this fall.  Corderius Saraceni E. Fatin Bachicha, PharmD, BCACP, [facility] [address], [place], [place] [address] Phone: [phone]; Fax: [phone] 01/16/2022 10:40 AM

## 2022-01-17 ENCOUNTER — Telehealth: Payer: Self-pay

## 2022-01-17 DIAGNOSIS — I10 Essential (primary) hypertension: Secondary | ICD-10-CM

## 2022-01-17 DIAGNOSIS — Z79899 Other long term (current) drug therapy: Secondary | ICD-10-CM

## 2022-01-17 MED ORDER — EMPAGLIFLOZIN 10 MG PO TABS: 10.0000 mg | ORAL_TABLET | Freq: Every day | ORAL | 11 refills | Status: DC

## 2022-01-17 NOTE — Progress Notes (Signed)
Subjective:   Carol [name] is a 86 y.o. female who presents for Medicare Annual (Subsequent) preventive examination.  This wellness visit is conducted by a nurse.  The patient's medications were reviewed and reconciled since the patient's last visit.  History details were provided by the patient.  The history appears to be reliable.    Medical History: Patient history and Family history was reviewed  Medications, Allergies, and preventative health maintenance was reviewed and updated.  Cardiac Risk Factors include: advanced age (>63mn, >>93women);diabetes mellitus;obesity (BMI >30kg/m2)     Objective:    Today's Vitals   12/06/21 0855  BP: 134/68  Pulse: 70  Resp: 16  SpO2: 95%  Weight: 204 lb 3.2 oz (92.6 kg)  Height: '5\' 7"'$  (1.702 m)  PainSc: 0-No pain   Body mass index is 31.98 kg/m.     10/06/2021   11:15 AM 04/21/2018    4:45 PM 04/15/2018   10:56 AM 04/24/2017   12:08 PM 04/20/2015    8:14 AM 12/22/2014    4:50 PM 12/20/2014   10:34 AM  Advanced Directives  Does Patient Have a Medical Advance Directive? Yes Yes Yes No No No No  Type of [place]amed[place]of [place] will Living will Living will      Does patient want to make changes to medical advance directive? No - Patient declined No - Patient declined No - Patient declined      Copy of HHoltin Chart? No - copy requested        Would patient like information on creating a medical advance directive?    No - Patient declined No - patient declined information No - patient declined information No - patient declined information    Current Medications (verified) Outpatient Encounter Medications as of 12/06/2021  Medication Sig   albuterol (VENTOLIN HFA) 108 (90 Base) MCG/ACT inhaler Inhale into the lungs.   Alcohol Swabs (ALCOHOL WIPES) 70 % PADS USE ONE PAD TO CLEAN AREA TO CHECK BLOOD SUGAR AS  DIRECTED   allopurinol (ZYLOPRIM) 300 MG tablet Take 300 mg by mouth  every morning.    Biotin 5000 MCG CAPS Take 5,000 mcg by mouth every morning.    Blood Glucose Monitoring Suppl (TRUE METRIX METER) DEVI Use to test blood sugars once daily. ICD: E11.65   carvedilol (COREG) 6.25 MG tablet Take 6.25 mg by mouth 2 (two) times daily with a meal.   clopidogrel (PLAVIX) 75 MG tablet Take 1 tablet (75 mg total) by mouth daily.   Continuous Blood Gluc Receiver (FREESTYLE LIBRE READER) DEVI 1 each by Does not apply route as directed.   ezetimibe (ZETIA) 10 MG tablet Take 10 mg by mouth daily.   famotidine (PEPCID) 10 MG tablet Take 10 mg by mouth 2 (two) times daily as needed for heartburn or indigestion.    fluticasone (FLONASE) 50 MCG/ACT nasal spray USE 1 SPRAY NASALLY EVERY DAY   furosemide (LASIX) 80 MG tablet Take 80 mg by mouth daily.   gabapentin (NEURONTIN) 300 MG capsule Take 300 mg by mouth 2 (two) times daily.   glucose blood (TRUE METRIX BLOOD GLUCOSE TEST) test strip Use as instructed   insulin NPH-regular Human (NOVOLIN 70/30) (70-30) 100 UNIT/ML injection Inject 25-32 Units into the skin daily. Reports 20 units in morning and 10-12 at night if needed.   isosorbide mononitrate (IMDUR) 60 MG 24 hr tablet Take 1 tablet (60 mg total) by mouth daily.   levothyroxine (  SYNTHROID, LEVOTHROID) 50 MCG tablet Take 50 mcg by mouth every morning.    methocarbamol (ROBAXIN) 500 MG tablet Take 1-2 tablets (500-1,000 mg total) by mouth every 6 (six) hours as needed for muscle spasms.   nitroGLYCERIN (NITROSTAT) 0.4 MG SL tablet Place 0.4 mg under the tongue as needed.   pantoprazole (PROTONIX) 40 MG tablet TAKE 1 TABLET BY MOUTH ONCE DAILY   potassium chloride (KLOR-CON) 10 MEQ tablet Take 10 mEq by mouth daily.   TRADJENTA 5 MG TABS tablet Take 5 mg by mouth daily.   TRUEplus Lancets 33G MISC Use to test blood sugar once daily. ICD: E11.65   [DISCONTINUED] aspirin EC 81 MG tablet Take 1 tablet (81 mg total) by mouth daily.   [DISCONTINUED] colchicine 0.6 MG tablet  Take 0.6 mg by mouth daily as needed (gout flare up.).    [DISCONTINUED] linaclotide (LINZESS) 72 MCG capsule Take 1 capsule (72 mcg total) by mouth daily before breakfast. (Patient not taking: Reported on 01/09/2022)   No facility-administered encounter medications on file as of 12/06/2021.    Allergies (verified) Iodinated contrast media, Atorvastatin, Corticosteroids, Hydrocodone, Other, Oxycodone, Simvastatin, and Cortizone-10 [hydrocortisone]   History: Past Medical History:  Diagnosis Date   Anemia    a. Noted on labs 02/2014 possibly procedurally related.   Asthma    CAD S/P percutaneous coronary angioplasty 01/22/2014   A. (12/2013): POBA - OM2 99% (very tortuous segment) - reduced ~ 50% & TIMI 3 flow, 80-90% stenosis in mid PDA, Irregularities <50% in mRCA, pLAD and prox LCx; b. 02/17/14: Moderate sized fixed defect in Lateral wall --> cath with OM2 restenosis & otherwise stable --> Xience Alpine DES x 2 (2.25 mm x 12 & 8 mm). c. NSTEMI 02/2014 s/p PTCA/balloon angioplasty only to small caliber PDA.   Carotid stenosis    Carotid US 11/17: L 1-39; FU prn   Chronic systolic heart failure ([place])    a. ICM b. ECHO (01/2014): EF 25-30%, grade I DD, trivial MR. c. EF by Myoview 02/17/14 = ~53%. d. EF by cath 02/24/14 - improved to 50-55%.   CKD (chronic kidney disease), stage III (HCC)    Contrast media allergy    Environmental allergies    GERD (gastroesophageal reflux disease)    Gout, unspecified    HOH (hard of hearing)    Hyperlipidemia    a. H/o intolerance to Zocor, Lipitor. Had muscle aches on higher dose Crestor.   Hypertension    Hypothyroidism    LBBB (left bundle branch block)    a. Transient during 02/2014 admission.   NSTEMI (non-ST elevated myocardial infarction) ([place]) 2014; 12/2013, 01/2014   QT prolongation    a. Noted on EKG 02/2014.   Sinus bradycardia    a. HR 40s-50s during 02/2014 admission, limiting BB dose.   TIA (transient ischemic attack) 1990's   a. TIA vs stroke  1990's "mild" - sounds like a TIA as she was told she had stroke symptoms but negative scans. Denies residual effects.   Type II diabetes mellitus [facility])    Past Surgical History:  Procedure Laterality Date   ABDOMINAL HYSTERECTOMY     complete   AMPUTATION TOE Right 02/25/2021   hammer toe.   [place] CATHETERIZATION  02/24/2014   Procedure: CORONARY BALLOON ANGIOPLASTY;  Surgeon: Burnell Blanks, MD;  Location: [facility] CATH LAB;  Service: Cardiovascular;;   CARDIAC CATHETERIZATION N/A 04/20/2015   Procedure: Left Heart Cath and Coronary Angiography;  Surgeon: Mallie Mussel  Nicholes Stairs, MD;  Location: [place] CV LAB;  Service: Cardiovascular;  Laterality: N/A;   CATARACT EXTRACTION W/ INTRAOCULAR LENS  IMPLANT, BILATERAL Bilateral 2013   CESAREAN SECTION  1959; [place]  12/2013   POBA - OM2   CORONARY ARTERY BYPASS GRAFT N/A 04/25/2015   Procedure: CORONARY ARTERY BYPASS GRAFTING (CABG) x four,  using left internal mammary artery and right leg greater saphenous vein harvested endoscopically;  Surgeon: Gaye Pollack, MD;  Location: [place];  Service: Open Heart Surgery;  Laterality: N/A;   JOINT REPLACEMENT Right 10 years ago    right knee (TKR)   LEFT HEART CATHETERIZATION WITH CORONARY ANGIOGRAM N/A 01/22/2014   Procedure: LEFT HEART CATHETERIZATION WITH CORONARY ANGIOGRAM;  Surgeon: Sinclair Grooms, MD;  Location: [facility] CATH LAB;  Service: Cardiovascular;  Laterality: N/A;   LEFT HEART CATHETERIZATION WITH CORONARY ANGIOGRAM N/A 02/17/2014   Procedure: LEFT HEART CATHETERIZATION WITH CORONARY ANGIOGRAM;  Surgeon: Burnell Blanks, MD;  Location: [facility] CATH LAB;  Service: Cardiovascular;  Laterality: N/A;   LEFT HEART CATHETERIZATION WITH CORONARY ANGIOGRAM N/A 02/24/2014   Procedure: LEFT HEART CATHETERIZATION WITH CORONARY ANGIOGRAM;  Surgeon: Burnell Blanks, MD;  Location[facility] C[facility] LAB;  Service: Cardiovascular;   Laterality: N/A;   LUMBAR LAMINECTOMY/DECOMPRESSION MICRODISCECTOMY Left 12/22/2014   Procedure: CENTRAL DECOMPRESSIVE LUMBAR, AND LAMINECTOMY L5-S1,  S1-S2 FOR SPINAL STENOSIS FORAMINOTOMY L5, S1, S2 ROOTS ON LEFT FOR FORAMINAL STENOSIS, AND DECOMPRESSION L5-S1, S1-S2 ACCORDING TO LABELING OF [place] X-RAYS;  Surgeon: Latanya Maudlin, MD;  Location: [facility];  Service: Orthopedics;  Laterality: Left;   PERCUTANEOUS CORONARY STENT INTERVENTION (PCI-S)  02/17/2014   For restenosis of OM2 - PCI with Xience Apline DES 2.25 mm x 12 mm & 2.25 mm x 8 mm overlapping   REDUCTION MAMMAPLASTY Bilateral 1990's   SHOULDER OPEN ROTATOR CUFF REPAIR Bilateral 1980's - 1990's   TEE WITHOUT CARDIOVERSION N/A 04/25/2015   Procedure: TRANSESOPHAGEAL ECHOCARDIOGRAM (TEE);  Surgeon: Gaye Pollack, MD;  Location: [place];  Service: Open Heart Surgery;  Laterality: N/A;   TOTAL KNEE ARTHROPLASTY Right 2009   TOTAL KNEE ARTHROPLASTY Left 04/21/2018   TKR;  Surgeon: Vickey Huger, MD;  Location: [facility];  Service: Orthopedics;  Laterality: Left;  Adductor Block   Family History  Problem Relation Age of Onset   Hypertension Mother    Diabetes Mother    Heart attack Mother 102   Heart disease Father    Emphysema Father    Diabetes Brother    Heart disease Brother    Cancer Sister    Stroke Neg Hx    Social History   Socioeconomic History   Marital status: Widowed    Spouse name: Not on file   Number of children: 2   Years of education: Not on file   Highest education level: Not on file  Occupational History   Occupation: Retired  Tobacco Use   Smoking status: Never   Smokeless tobacco: Never  Vaping Use   Vaping Use: Never used  Substance and Sexual Activity   Alcohol use: No   Drug use: No   Sexual activity: Never  Other Topics Concern   Not on file  Social History Narrative   Lives in [place] by herself.    Social Determinants of Health   Financial Resource Strain: Low Risk   (10/06/2021)   Overall Financial Resource Strain (CARDIA)    Difficulty of Paying Living  Expenses: Not hard at all  Food Insecurity: No Food Insecurity (10/06/2021)   Hunger Vital Sign    Worried About Running Out of Food in the Last Year: Never true    Ran Out of Food in the Last Year: Never true  Transportation Needs: No Transportation Needs (10/06/2021)   PRAPARE - Hydrologist (Medical): No    Lack of Transportation (Non-Medical): No  Physical Activity: Sufficiently Active (10/06/2021)   Exercise Vital Sign    Days of Exercise per Week: 6 days    Minutes of Exercise per Session: 40 min  Stress: No Stress Concern Present (10/06/2021)   [place]    Feeling of Stress : Not at all  Social Connections: Not on file    Tobacco Counseling Counseling given: Not Answered   Clinical Intake:  Pre-visit preparation completed: Yes Pain : No/denies pain Pain Score: 0-No pain   BMI - recorded: 31.98 Nutritional Status: BMI > 30  Obese Nutritional Risks: None Diabetes: Yes (A1C 8.0) CBG done?: No Did pt. bring in CBG monitor from home?: No       Activities of Daily Living    12/06/2021    9:03 AM  In your present state of health, do you have any difficulty performing the following activities:  Hearing? 1  Vision? 1  Difficulty concentrating or making decisions? 0  Walking or climbing stairs? 0  Dressing or bathing? 0  Doing errands, shopping? 0  Preparing Food and eating ? N  Using the Toilet? N  In the past six months, have you accidently leaked urine? Y  Do you have problems with loss of bowel control? N  Managing your Medications? N  Managing your Finances? N  Housekeeping or managing your Housekeeping? N    Patient Care Team: Rochel Brome, MD as PCP - General (Internal Medicine) Belva Crome, MD as PCP - Cardiology (Cardiology)     Assessment:   This is a routine  wellness examination for [place].  Hearing/Vision screen No results found.  Dietary issues and exercise activities discussed: Current Exercise Habits: The patient does not participate in regular exercise at present  Depression Screen    01/09/2022   10:18 AM 11/09/2021   10:40 AM 10/06/2021   11:14 AM  PHQ 2/9 Scores  PHQ - 2 Score 0 0 0    Fall Risk    01/09/2022   10:17 AM 11/09/2021   10:40 AM 10/06/2021   11:11 AM  Fall Risk   Falls in the past year? 0 0 1  Number falls in past yr: 0 0 0  Comment   fx rt arm  Injury with Fall? 0 0 1  Comment   broke rt arm  Risk for fall due to : No Fall Risks  History of fall(s)  Follow up Falls evaluation completed Falls evaluation completed Education provided;Falls prevention discussed    FALL RISK PREVENTION PERTAINING TO THE HOME:  Any stairs in or around the home? Yes  If so, are there any without handrails? No  Home free of loose throw rugs in walkways, pet beds, electrical cords, etc? Yes  Adequate lighting in your home to reduce risk of falls? Yes   ASSISTIVE DEVICES UTILIZED TO PREVENT FALLS:  Use of a cane, walker or w/c? No  Grab bars in the bathroom? No  Shower chair or bench in shower? Yes  Gait slow and steady without use of assistive  device  Cognitive Function:        Immunizations Immunization History  Administered Date(s) Administered   Influenza-Unspecified 06/25/2014   Pneumococcal Conjugate-13 07/13/2014   Pneumococcal Polysaccharide-23 03/24/2008   Tdap 05/13/2021   Zoster, Live 06/26/2011, 08/05/2014    TDAP status: Up to date  Flu Vaccine status: Declined, Education has been provided regarding the importance of this vaccine but patient still declined. Advised may receive this vaccine at local pharmacy or Health Dept. Aware to provide a copy of the vaccination record if obtained from local pharmacy or Health Dept. Verbalized acceptance and understanding.  Pneumococcal vaccine status: Up to  date  Covid-19 vaccine status: Declined, Education has been provided regarding the importance of this vaccine but patient still declined. Advised may receive this vaccine at local pharmacy or Health Dept.or vaccine clinic. Aware to provide a copy of the vaccination record if obtained from local pharmacy or Health Dept. Verbalized acceptance and understanding.   Screening Tests Health Maintenance  Topic Date Due   COVID-19 Vaccine (1) Never done   Zoster Vaccines- Shingrix (1 of 2) Never done   FOOT EXAM  07/27/2016   INFLUENZA VACCINE  01/23/2022   HEMOGLOBIN A1C  07/08/2022   OPHTHALMOLOGY EXAM  11/03/2022   TETANUS/TDAP  05/14/2031   Pneumonia Vaccine 68+ Years old  Completed   DEXA SCAN  Completed   HPV VACCINES  Aged Out    Health Maintenance  Health Maintenance Due  Topic Date Due   COVID-19 Vaccine (1) Never done   Zoster Vaccines- Shingrix (1 of 2) Never done   FOOT EXAM  07/27/2016    Colorectal cancer screening: No longer required.   Lung Cancer Screening: (Low Dose CT Chest recommended if Age 40-80 years, 30 pack-year currently smoking OR have quit w/in 15years.) does not qualify.   Additional Screening:  Vision Screening: Recommended annual ophthalmology exams for early detection of glaucoma and other disorders of the eye. Is the patient up to date with their annual eye exam?  Yes  Who is the provider or what is the name of the office in which the patient attends annual eye exams? [place] Screening: Recommended annual dental exams for proper oral hygiene     Plan:     I have personally reviewed and noted the following in the patient's chart:   Medical and social history Use of alcohol, tobacco or illicit drugs  Current medications and supplements including opioid prescriptions.  Functional ability and status Nutritional status Physical activity Advanced directives List of other physicians Hospitalizations, surgeries, and ER visits in  previous 12 months Vitals Screenings to include cognitive, depression, and falls Referrals and appointments  In addition, I have reviewed and discussed with patient certain preventive protocols, quality metrics, and best practice recommendations. A written personalized care plan for preventive services as well as general preventive health recommendations were provided to patient.     Erie Noe, LPN   8/89/1694

## 2022-01-17 NOTE — Patient Instructions (Signed)

## 2022-01-17 NOTE — Telephone Encounter (Signed)
-----   Message from Belva Crome, MD sent at 01/16/2022  4:50 PM EDT ----- Let the patient know the echocardiogram demonstrates stiffness of heart when filling. This can cause SOB on exertion. Please consider Jardiance or [place]. A copy will be sent to Stacie Glaze, DO

## 2022-01-17 NOTE — Telephone Encounter (Signed)
Spoke with patient and discussed results of echo.  Per Dr. Tamala Julian: Let the patient know the echocardiogram demonstrates stiffness of heart when filling. This can cause SOB on exertion. Please consider Jardiance or [place].  Patient verbalized understanding and states she has tried Jardiance in the past and would like to try again as she did not have any side effects in the past with this medication.  Jardiance '10mg'$  QD ordered and sent to pharmacy of choice with 14-day free trial coupon attached to pharmacy note.  Provided number for [place] [phone]) to inquire about patient assistance of potential cost reduction of Jardiance Rx.  BMET ordered and scheduled for 01/29/22 at our [facility] office.  Patient expressed appreciation for time and call.

## 2022-01-17 NOTE — Addendum Note (Signed)
Addended by: Molli Barrows on: 01/17/2022 02:03 PM   Modules accepted: Orders

## 2022-01-22 DIAGNOSIS — I5042 Chronic combined systolic (congestive) and diastolic (congestive) heart failure: Secondary | ICD-10-CM

## 2022-01-22 DIAGNOSIS — I10 Essential (primary) hypertension: Secondary | ICD-10-CM | POA: Diagnosis not present

## 2022-01-23 ENCOUNTER — Ambulatory Visit: Payer: Medicare HMO

## 2022-01-27 DIAGNOSIS — E1122 Type 2 diabetes mellitus with diabetic chronic kidney disease: Secondary | ICD-10-CM | POA: Diagnosis not present

## 2022-01-29 ENCOUNTER — Other Ambulatory Visit: Payer: Medicare HMO

## 2022-01-30 ENCOUNTER — Telehealth: Payer: Self-pay | Admitting: Interventional Cardiology

## 2022-01-30 NOTE — Telephone Encounter (Signed)
Pt c/o medication issue:  1. Name of Medication: rosuvastatin (CRESTOR) 5 MG tablet  2. How are you currently taking this medication (dosage and times per day)? Take 1 tablet (5 mg total) by mouth daily.  3. Are you having a reaction (difficulty breathing--STAT)? no  4. What is your medication issue? Patient states she can't walk, she is sore all over. And she is itchy.

## 2022-01-31 NOTE — Telephone Encounter (Signed)
Recommend referral to lipid clinic

## 2022-01-31 NOTE — Telephone Encounter (Signed)
Patient reports she stopped taking Rosuvastatin last week due to it causing her legs to be sore making her unable to walk through her home. She reports the medication also caused her to have itching. She states "I'm not putting that in my body ever again."  Will forward to Dr. Tamala Julian and Pharm D to review and advise.

## 2022-02-01 DIAGNOSIS — E113591 Type 2 diabetes mellitus with proliferative diabetic retinopathy without macular edema, right eye: Secondary | ICD-10-CM | POA: Diagnosis not present

## 2022-02-01 DIAGNOSIS — E113593 Type 2 diabetes mellitus with proliferative diabetic retinopathy without macular edema, bilateral: Secondary | ICD-10-CM | POA: Diagnosis not present

## 2022-02-01 DIAGNOSIS — E113592 Type 2 diabetes mellitus with proliferative diabetic retinopathy without macular edema, left eye: Secondary | ICD-10-CM | POA: Diagnosis not present

## 2022-02-05 ENCOUNTER — Telehealth: Payer: Self-pay | Admitting: Interventional Cardiology

## 2022-02-05 ENCOUNTER — Other Ambulatory Visit: Payer: Self-pay

## 2022-02-05 DIAGNOSIS — I10 Essential (primary) hypertension: Secondary | ICD-10-CM

## 2022-02-05 MED ORDER — CLOPIDOGREL BISULFATE 75 MG PO TABS: 75.0000 mg | ORAL_TABLET | Freq: Every day | ORAL | 1 refills | Status: DC

## 2022-02-05 MED ORDER — ISOSORBIDE MONONITRATE ER 60 MG PO TB24: 60.0000 mg | ORAL_TABLET | Freq: Every day | ORAL | 1 refills | Status: DC

## 2022-02-05 NOTE — Telephone Encounter (Signed)
Pt states she is supposed to donate blood to [facility], [place], [place] [address].  She states that we need to call or send over the okay she is good to donate blood over there. Please advise.

## 2022-02-06 NOTE — Telephone Encounter (Signed)
Tried to call patient back. No answer and voicemail is full.

## 2022-02-07 NOTE — Telephone Encounter (Signed)
Returned call to patient, she states she did not call about donating blood. She states she had gone to [facility] in [place] for requested BMET but no order was available.  BMET reordered and released to [facility]. Patient states she can go back tomorrow to have labs drawn.

## 2022-02-08 DIAGNOSIS — I10 Essential (primary) hypertension: Secondary | ICD-10-CM | POA: Diagnosis not present

## 2022-02-09 LAB — BASIC METABOLIC PANEL
BUN/Creatinine Ratio: 26 (ref 12–28)
BUN: 41 mg/dL — ABNORMAL HIGH (ref 8–27)
CO2: 24 mmol/L (ref 20–29)
Calcium: 9.7 mg/dL (ref 8.7–10.3)
Chloride: 99 mmol/L (ref 96–106)
Creatinine, Ser: 1.58 mg/dL — ABNORMAL HIGH (ref 0.57–1.00)
Glucose: 258 mg/dL — ABNORMAL HIGH (ref 70–99)
Potassium: 4.5 mmol/L (ref 3.5–5.2)
Sodium: 139 mmol/L (ref 134–144)
eGFR: 32 mL/min/{1.73_m2} — ABNORMAL LOW (ref >59)

## 2022-02-12 ENCOUNTER — Telehealth: Payer: Self-pay

## 2022-02-12 NOTE — Telephone Encounter (Signed)
-----   Message from Belva Crome, MD sent at 02/09/2022 11:52 AM EDT ----- Let the patient know the labs are stable since starting SGLT2 therapy.  How she feeling? A copy will be sent to Stacie Glaze, DO

## 2022-02-12 NOTE — Telephone Encounter (Signed)
Spoke with patient to discuss lab results.  Patient states she misplaced number for patient assistance for [facility].  Provided number for [facility] [phone].  Patient expressed appreciation for call.

## 2022-02-27 DIAGNOSIS — E1122 Type 2 diabetes mellitus with diabetic chronic kidney disease: Secondary | ICD-10-CM | POA: Diagnosis not present

## 2022-03-09 DIAGNOSIS — M7989 Other specified soft tissue disorders: Secondary | ICD-10-CM | POA: Diagnosis not present

## 2022-03-09 DIAGNOSIS — M79662 Pain in left lower leg: Secondary | ICD-10-CM | POA: Diagnosis not present

## 2022-03-12 ENCOUNTER — Encounter: Payer: Self-pay | Admitting: Family Medicine

## 2022-03-12 ENCOUNTER — Ambulatory Visit (INDEPENDENT_AMBULATORY_CARE_PROVIDER_SITE_OTHER): Payer: Medicare HMO | Admitting: Family Medicine

## 2022-03-12 ENCOUNTER — Telehealth: Payer: Self-pay

## 2022-03-12 VITALS — BP 132/78 | HR 66 | Temp 97.2°F | Ht 67.0 in | Wt 203.0 lb

## 2022-03-12 DIAGNOSIS — E1140 Type 2 diabetes mellitus with diabetic neuropathy, unspecified: Secondary | ICD-10-CM | POA: Diagnosis not present

## 2022-03-12 DIAGNOSIS — Z794 Long term (current) use of insulin: Secondary | ICD-10-CM

## 2022-03-12 DIAGNOSIS — R21 Rash and other nonspecific skin eruption: Secondary | ICD-10-CM | POA: Diagnosis not present

## 2022-03-12 MED ORDER — PREDNISONE 50 MG PO TABS: 50.0000 mg | ORAL_TABLET | Freq: Every day | ORAL | 0 refills | Status: DC

## 2022-03-12 MED ORDER — DOXYCYCLINE HYCLATE 100 MG PO TABS: 100.0000 mg | ORAL_TABLET | Freq: Two times a day (BID) | ORAL | 0 refills | Status: DC

## 2022-03-12 NOTE — Telephone Encounter (Signed)
Carol [name] called to report that she was seen at Urgent Care on Friday and sent to [facility] for a ultrasound of her leg which was negative. She continues to have redness and swelling of her leg.  She is taking the antibiotic as prescribed.  Follow-up scheduled for this afternoon with Dr. Tobie Poet.

## 2022-03-12 NOTE — Progress Notes (Unsigned)
Acute Office Visit  Subjective:    Patient ID: Carol [name], female    DOB: Nov 13, 1935, 86 y.o.   MRN: [id number]  Chief Complaint  Patient presents with   Follow-up    HPI Patient is in today for ***  Past Medical History:  Diagnosis Date   Anemia    a. Noted on labs 02/2014 possibly procedurally related.   Asthma    CAD S/P percutaneous coronary angioplasty 01/22/2014   A. (12/2013): POBA - OM2 99% (very tortuous segment) - reduced ~ 50% & TIMI 3 flow, 80-90% stenosis in mid PDA, Irregularities <50% in mRCA, pLAD and prox LCx; b. 02/17/14: Moderate sized fixed defect in Lateral wall --> cath with OM2 restenosis & otherwise stable --> Xience Alpine DES x 2 (2.25 mm x 12 & 8 mm). c. NSTEMI 02/2014 s/p PTCA/balloon angioplasty only to small caliber PDA.   Carotid stenosis    Carotid US 11/17: L 1-39; FU prn   Chronic systolic heart failure ([place])    a. ICM b. ECHO (01/2014): EF 25-30%, grade I DD, trivial MR. c. EF by Myoview 02/17/14 = ~53%. d. EF by cath 02/24/14 - improved to 50-55%.   CKD (chronic kidney disease), stage III (HCC)    Contrast media allergy    Environmental allergies    GERD (gastroesophageal reflux disease)    Gout, unspecified    HOH (hard of hearing)    Hyperlipidemia    a. H/o intolerance to Zocor, Lipitor. Had muscle aches on higher dose Crestor.   Hypertension    Hypothyroidism    LBBB (left bundle branch block)    a. Transient during 02/2014 admission.   NSTEMI (non-ST elevated myocardial infarction) ([place]) 2014; 12/2013, 01/2014   QT prolongation    a. Noted on EKG 02/2014.   Sinus bradycardia    a. HR 40s-50s during 02/2014 admission, limiting BB dose.   TIA (transient ischemic attack) 1990's   a. TIA vs stroke 1990's "mild" - sounds like a TIA as she was told she had stroke symptoms but negative scans. Denies residual effects.   Type II diabetes mellitus [facility])     Past Surgical History:  Procedure Laterality Date   ABDOMINAL HYSTERECTOMY      complete   AMPUTATION TOE Right 02/25/2021   hammer toe.   [place] CATHETERIZATION  02/24/2014   Procedure: CORONARY BALLOON ANGIOPLASTY;  Surgeon: Burnell Blanks, MD;  Location: [facility] CATH LAB;  Service: Cardiovascular;;   CARDIAC CATHETERIZATION N/A 04/20/2015   Procedure: Left Heart Cath and Coronary Angiography;  Surgeon: Belva Crome, MD;  Location: [facility] CV LAB;  Service: Cardiovascular;  Laterality: N/A;   CATARACT EXTRACTION W/ INTRAOCULAR LENS  IMPLANT, BILATERAL Bilateral 2013   CESAREAN SECTION  1959; [place]  12/2013   POBA - OM2   CORONARY ARTERY BYPASS GRAFT N/A 04/25/2015   Procedure: CORONARY ARTERY BYPASS GRAFTING (CABG) x four,  using left internal mammary artery and right leg greater saphenous vein harvested endoscopically;  Surgeon: Gaye Pollack, MD;  Location: [facility];  Service: Open Heart Surgery;  Laterality: N/A;   JOINT REPLACEMENT Right 10 years ago    right knee (TKR)   LEFT HEART CATHETERIZATION WITH CORONARY ANGIOGRAM N/A 01/22/2014   Procedure: LEFT HEART CATHETERIZATION WITH CORONARY ANGIOGRAM;  Surgeon: Sinclair Grooms, MD;  Location: [facility] CATH LAB;  Service: Cardiovascular;  Laterality: N/A;   LEFT  HEART CATHETERIZATION WITH CORONARY ANGIOGRAM N/A 02/17/2014   Procedure: LEFT HEART CATHETERIZATION WITH CORONARY ANGIOGRAM;  Surgeon: Burnell Blanks, MD;  Location: [facility] CATH LAB;  Service: Cardiovascular;  Laterality: N/A;   LEFT HEART CATHETERIZATION WITH CORONARY ANGIOGRAM N/A 02/24/2014   Procedure: LEFT HEART CATHETERIZATION WITH CORONARY ANGIOGRAM;  Surgeon: Burnell Blanks, MD;  Location: [facility] CATH LAB;  Service: Cardiovascular;  Laterality: N/A;   LUMBAR LAMINECTOMY/DECOMPRESSION MICRODISCECTOMY Left 12/22/2014   Procedure: CENTRAL DECOMPRESSIVE LUMBAR, AND LAMINECTOMY L5-S1,  S1-S2 FOR SPINAL STENOSIS FORAMINOTOMY L5, S1, S2 ROOTS ON LEFT FOR FORAMINAL STENOSIS, AND  DECOMPRESSION L5-S1, S1-S2 ACCORDING TO LABELING OF [place] X-RAYS;  Surgeon: Latanya Maudlin, MD;  Location: [facility];  Service: Orthopedics;  Laterality: Left;   PERCUTANEOUS CORONARY STENT INTERVENTION (PCI-S)  02/17/2014   For restenosis of OM2 - PCI with Xience Apline DES 2.25 mm x 12 mm & 2.25 mm x 8 mm overlapping   REDUCTION MAMMAPLASTY Bilateral 1990's   SHOULDER OPEN ROTATOR CUFF REPAIR Bilateral 1980's - 1990's   TEE WITHOUT CARDIOVERSION N/A 04/25/2015   Procedure: TRANSESOPHAGEAL ECHOCARDIOGRAM (TEE);  Surgeon: Gaye Pollack, MD;  Location: [facility];  Service: Open Heart Surgery;  Laterality: N/A;   TOTAL KNEE ARTHROPLASTY Right 2009   TOTAL KNEE ARTHROPLASTY Left 04/21/2018   TKR;  Surgeon: Vickey Huger, MD;  Location: [facility];  Service: Orthopedics;  Laterality: Left;  Adductor Block    Family History  Problem Relation Age of Onset   Hypertension Mother    Diabetes Mother    Heart attack Mother 31   Heart disease Father    Emphysema Father    Diabetes Brother    Heart disease Brother    Cancer Sister    Stroke Neg Hx     Social History   Socioeconomic History   Marital status: Widowed    Spouse name: Not on file   Number of children: 2   Years of education: Not on file   Highest education level: Not on file  Occupational History   Occupation: Retired  Tobacco Use   Smoking status: Never   Smokeless tobacco: Never  Vaping Use   Vaping Use: Never used  Substance and Sexual Activity   Alcohol use: No   Drug use: No   Sexual activity: Never  Other Topics Concern   Not on file  Social History Narrative   Lives in [place] by herself.    Social Determinants of Health   Financial Resource Strain: Low Risk  (10/06/2021)   Overall Financial Resource Strain (CARDIA)    Difficulty of Paying Living Expenses: Not hard at all  Food Insecurity: No Food Insecurity (10/06/2021)   Hunger Vital Sign    Worried About Running Out of Food in the Last Year: Never  true    Ran Out of Food in the Last Year: Never true  Transportation Needs: No Transportation Needs (10/06/2021)   PRAPARE - Hydrologist (Medical): No    Lack of Transportation (Non-Medical): No  Physical Activity: Sufficiently Active (10/06/2021)   Exercise Vital Sign    Days of Exercise per Week: 6 days    Minutes of Exercise per Session: 40 min  Stress: No Stress Concern Present (10/06/2021)   [place]    Feeling of Stress : Not at all  Social Connections: Not on file  Intimate Partner Violence: Not on file    Outpatient Medications Prior to  Visit  Medication Sig Dispense Refill   albuterol (VENTOLIN HFA) 108 (90 Base) MCG/ACT inhaler Inhale into the lungs.     Alcohol Swabs (ALCOHOL WIPES) 70 % PADS USE ONE PAD TO CLEAN AREA TO CHECK BLOOD SUGAR AS  DIRECTED     allopurinol (ZYLOPRIM) 300 MG tablet Take 300 mg by mouth every morning.      Biotin 5000 MCG CAPS Take 5,000 mcg by mouth every morning.      Blood Glucose Monitoring Suppl (TRUE METRIX METER) DEVI Use to test blood sugars once daily. ICD: E11.65     calcium carbonate (OSCAL) 1500 (600 Ca) MG TABS tablet Take 600 mg by mouth 2 (two) times daily with a meal.     carvedilol (COREG) 6.25 MG tablet Take 6.25 mg by mouth 2 (two) times daily with a meal.     clopidogrel (PLAVIX) 75 MG tablet Take 1 tablet (75 mg total) by mouth daily. 90 tablet 1   Continuous Blood Gluc Receiver (FREESTYLE LIBRE READER) DEVI 1 each by Does not apply route as directed. 1 each 0   empagliflozin (JARDIANCE) 10 MG TABS tablet Take 1 tablet (10 mg total) by mouth daily before breakfast. 30 tablet 11   ezetimibe (ZETIA) 10 MG tablet Take 10 mg by mouth daily.     famotidine (PEPCID) 10 MG tablet Take 10 mg by mouth 2 (two) times daily as needed for heartburn or indigestion.      fluticasone (FLONASE) 50 MCG/ACT nasal spray USE 1 SPRAY NASALLY EVERY DAY      furosemide (LASIX) 80 MG tablet Take 80 mg by mouth daily.     gabapentin (NEURONTIN) 300 MG capsule Take 300 mg by mouth 2 (two) times daily.     glucose blood (TRUE METRIX BLOOD GLUCOSE TEST) test strip Use as instructed 100 each 12   hydrALAZINE (APRESOLINE) 25 MG tablet Take 25 mg by mouth 2 (two) times daily.     insulin NPH-regular Human (NOVOLIN 70/30) (70-30) 100 UNIT/ML injection Inject 25-32 Units into the skin daily. Reports 20 units in morning and 10-12 at night if needed.     isosorbide mononitrate (IMDUR) 60 MG 24 hr tablet Take 1 tablet (60 mg total) by mouth daily. 90 tablet 1   levothyroxine (SYNTHROID, LEVOTHROID) 50 MCG tablet Take 50 mcg by mouth every morning.      lisinopril (ZESTRIL) 5 MG tablet Take 5 mg by mouth daily.     methocarbamol (ROBAXIN) 500 MG tablet Take 1-2 tablets (500-1,000 mg total) by mouth every 6 (six) hours as needed for muscle spasms. 60 tablet 0   nitroGLYCERIN (NITROSTAT) 0.4 MG SL tablet Place 0.4 mg under the tongue as needed.     pantoprazole (PROTONIX) 40 MG tablet TAKE 1 TABLET BY MOUTH ONCE DAILY 30 tablet 10   potassium chloride (KLOR-CON) 10 MEQ tablet Take 10 mEq by mouth daily.     rosuvastatin (CRESTOR) 5 MG tablet Take 1 tablet (5 mg total) by mouth daily. 90 tablet 3   sulfamethoxazole-trimethoprim (BACTRIM DS) 800-160 MG tablet Take 1 tablet by mouth 2 (two) times daily.     TRADJENTA 5 MG TABS tablet Take 5 mg by mouth daily.     traMADol (ULTRAM) 50 MG tablet Take 1 tablet (50 mg total) by mouth every 6 (six) hours as needed. 30 tablet 1   TRUEplus Lancets 33G MISC Use to test blood sugar once daily. ICD: E11.65     colchicine 0.6 MG tablet Take 1  tablet (0.6 mg total) by mouth 2 (two) times daily as needed for up to 7 days (gout flare up.). 30 tablet 1   No facility-administered medications prior to visit.    Allergies  Allergen Reactions   Iodinated Contrast Media Other (See Comments) and Anaphylaxis    unknown Contrast  agent    Atorvastatin Other (See Comments)    Muscle aches- severe Muscle aches- severe Muscle aches- severe Muscle aches- severe   Corticosteroids Other (See Comments)    Elevated blood sugar  Elevated blood sugar   Hydrocodone Nausea And Vomiting   Other Nausea And Vomiting    Pain medications except can tramadol   Oxycodone Nausea And Vomiting   Simvastatin Other (See Comments)    Muscle aches- severe Muscle aches- severe Muscle aches- severe Muscle aches- severe   Cortizone-10 [Hydrocortisone] Other (See Comments)    unknown    Review of Systems  Constitutional:  Negative for appetite change, fatigue and fever.  HENT:  Negative for congestion, ear pain, sinus pressure and sore throat.   Respiratory:  Negative for cough, chest tightness, shortness of breath and wheezing.   Cardiovascular:  Positive for leg swelling (left worse than right). Negative for chest pain and palpitations.  Gastrointestinal:  Negative for abdominal pain, constipation, diarrhea, nausea and vomiting.  Genitourinary:  Negative for dysuria and hematuria.  Musculoskeletal:  Negative for arthralgias, back pain, joint swelling and myalgias.  Skin:  Positive for rash (Both lower legs).  Neurological:  Negative for dizziness, weakness and headaches.  Psychiatric/Behavioral:  Negative for dysphoric mood. The patient is not nervous/anxious.        Objective:    Physical Exam Vitals reviewed.  Constitutional:      Appearance: Normal appearance. She is normal weight.  Cardiovascular:     Rate and Rhythm: Normal rate and regular rhythm.     Heart sounds: Normal heart sounds.  Pulmonary:     Effort: Pulmonary effort is normal.     Breath sounds: Normal breath sounds.  Abdominal:     General: Abdomen is flat. Bowel sounds are normal.     Palpations: Abdomen is soft.  Neurological:     Mental Status: She is alert and oriented to person, place, and time.  Psychiatric:        Mood and Affect: Mood  normal.        Behavior: Behavior normal.     BP 132/78 (BP Location: Left Arm, Patient Position: Sitting)   Pulse 66   Temp (!) 97.2 F (36.2 C) (Temporal)   Ht 5' 7"  (1.702 m)   Wt 203 lb (92.1 kg)   SpO2 97%   BMI 31.79 kg/m  Wt Readings from Last 3 Encounters:  03/12/22 203 lb (92.1 kg)  01/11/22 200 lb (90.7 kg)  01/14/22 200 lb (90.7 kg)    Health Maintenance Due  Topic Date Due   COVID-19 Vaccine (1) Never done   Zoster Vaccines- Shingrix (1 of 2) Never done   FOOT EXAM  07/27/2016   INFLUENZA VACCINE  01/23/2022    There are no preventive care reminders to display for this patient.   Lab Results  Component Value Date   TSH 3.250 09/29/2021   Lab Results  Component Value Date   WBC 7.1 01/05/2022   HGB 11.8 01/05/2022   HCT 35.0 01/05/2022   MCV 94 01/05/2022   PLT 201 01/05/2022   Lab Results  Component Value Date   NA 139 02/08/2022   K  4.5 02/08/2022   CO2 24 02/08/2022   GLUCOSE 258 (H) 02/08/2022   BUN 41 (H) 02/08/2022   CREATININE 1.58 (H) 02/08/2022   BILITOT 0.3 01/05/2022   ALKPHOS 120 01/05/2022   AST 14 01/05/2022   ALT 10 01/05/2022   PROT 6.5 01/05/2022   ALBUMIN 4.3 01/05/2022   CALCIUM 9.7 02/08/2022   ANIONGAP 10 04/22/2018   EGFR 32 (L) 02/08/2022   GFR 52.99 (L) 03/04/2014   Lab Results  Component Value Date   CHOL 148 01/05/2022   Lab Results  Component Value Date   HDL 44 01/05/2022   Lab Results  Component Value Date   LDLCALC 82 01/05/2022   Lab Results  Component Value Date   TRIG 125 01/05/2022   Lab Results  Component Value Date   CHOLHDL 3.4 01/05/2022   Lab Results  Component Value Date   HGBA1C 7.0 (H) 01/05/2022         Assessment & Plan:   There are no diagnoses linked to this encounter.   No orders of the defined types were placed in this encounter.   I,Lauren M Auman,acting as a scribe for Rochel Brome, MD.,have documented all relevant documentation on the behalf of Rochel Brome,  MD,as directed by  Rochel Brome, MD while in the presence of Rochel Brome, MD.   Oleta Mouse, CMA

## 2022-03-12 NOTE — Patient Instructions (Signed)
Start Doxycycline. Stop Bactrim DS.  Start prednisone.  Increase insulin by 5 U while on prednisone.

## 2022-03-12 NOTE — Progress Notes (Unsigned)
Subjective:  Patient ID: Carol [name], female    DOB: [birth year]  Age: 86 y.o. MRN: [id number]  No chief complaint on file.   HPI   Current Outpatient Medications on File Prior to Visit  Medication Sig Dispense Refill   albuterol (VENTOLIN HFA) 108 (90 Base) MCG/ACT inhaler Inhale into the lungs.     Alcohol Swabs (ALCOHOL WIPES) 70 % PADS USE ONE PAD TO CLEAN AREA TO CHECK BLOOD SUGAR AS  DIRECTED     allopurinol (ZYLOPRIM) 300 MG tablet Take 300 mg by mouth every morning.      Biotin 5000 MCG CAPS Take 5,000 mcg by mouth every morning.      Blood Glucose Monitoring Suppl (TRUE METRIX METER) DEVI Use to test blood sugars once daily. ICD: E11.65     calcium carbonate (OSCAL) 1500 (600 Ca) MG TABS tablet Take 600 mg by mouth 2 (two) times daily with a meal.     carvedilol (COREG) 6.25 MG tablet Take 6.25 mg by mouth 2 (two) times daily with a meal.     clopidogrel (PLAVIX) 75 MG tablet Take 1 tablet (75 mg total) by mouth daily. 90 tablet 1   colchicine 0.6 MG tablet Take 1 tablet (0.6 mg total) by mouth 2 (two) times daily as needed for up to 7 days (gout flare up.). 30 tablet 1   Continuous Blood Gluc Receiver (FREESTYLE LIBRE READER) DEVI 1 each by Does not apply route as directed. 1 each 0   empagliflozin (JARDIANCE) 10 MG TABS tablet Take 1 tablet (10 mg total) by mouth daily before breakfast. 30 tablet 11   ezetimibe (ZETIA) 10 MG tablet Take 10 mg by mouth daily.     famotidine (PEPCID) 10 MG tablet Take 10 mg by mouth 2 (two) times daily as needed for heartburn or indigestion.      fluticasone (FLONASE) 50 MCG/ACT nasal spray USE 1 SPRAY NASALLY EVERY DAY     furosemide (LASIX) 80 MG tablet Take 80 mg by mouth daily.     gabapentin (NEURONTIN) 300 MG capsule Take 300 mg by mouth 2 (two) times daily.     glucose blood (TRUE METRIX BLOOD GLUCOSE TEST) test strip Use as instructed 100 each 12   hydrALAZINE (APRESOLINE) 25 MG tablet Take 25 mg by mouth 2 (two) times daily.      insulin NPH-regular Human (NOVOLIN 70/30) (70-30) 100 UNIT/ML injection Inject 25-32 Units into the skin daily. Reports 20 units in morning and 10-12 at night if needed.     isosorbide mononitrate (IMDUR) 60 MG 24 hr tablet Take 1 tablet (60 mg total) by mouth daily. 90 tablet 1   levothyroxine (SYNTHROID, LEVOTHROID) 50 MCG tablet Take 50 mcg by mouth every morning.      lisinopril (ZESTRIL) 5 MG tablet Take 5 mg by mouth daily.     methocarbamol (ROBAXIN) 500 MG tablet Take 1-2 tablets (500-1,000 mg total) by mouth every 6 (six) hours as needed for muscle spasms. 60 tablet 0   nitroGLYCERIN (NITROSTAT) 0.4 MG SL tablet Place 0.4 mg under the tongue as needed.     pantoprazole (PROTONIX) 40 MG tablet TAKE 1 TABLET BY MOUTH ONCE DAILY 30 tablet 10   potassium chloride (KLOR-CON) 10 MEQ tablet Take 10 mEq by mouth daily.     rosuvastatin (CRESTOR) 5 MG tablet Take 1 tablet (5 mg total) by mouth daily. 90 tablet 3   TRADJENTA 5 MG TABS tablet Take 5 mg by mouth daily.  traMADol (ULTRAM) 50 MG tablet Take 1 tablet (50 mg total) by mouth every 6 (six) hours as needed. 30 tablet 1   TRUEplus Lancets 33G MISC Use to test blood sugar once daily. ICD: E11.65     No current facility-administered medications on file prior to visit.   Past Medical History:  Diagnosis Date   Anemia    a. Noted on labs 02/2014 possibly procedurally related.   Asthma    CAD S/P percutaneous coronary angioplasty 01/22/2014   A. (12/2013): POBA - OM2 99% (very tortuous segment) - reduced ~ 50% & TIMI 3 flow, 80-90% stenosis in mid PDA, Irregularities <50% in mRCA, pLAD and prox LCx; b. 02/17/14: Moderate sized fixed defect in Lateral wall --> cath with OM2 restenosis & otherwise stable --> Xience Alpine DES x 2 (2.25 mm x 12 & 8 mm). c. NSTEMI 02/2014 s/p PTCA/balloon angioplasty only to small caliber PDA.   Carotid stenosis    Carotid US 11/17: L 1-39; FU prn   Chronic systolic heart failure ([place])    a. ICM b. ECHO (01/2014):  EF 25-30%, grade I DD, trivial MR. c. EF by Myoview 02/17/14 = ~53%. d. EF by cath 02/24/14 - improved to 50-55%.   CKD (chronic kidney disease), stage III (HCC)    Contrast media allergy    Environmental allergies    GERD (gastroesophageal reflux disease)    Gout, unspecified    HOH (hard of hearing)    Hyperlipidemia    a. H/o intolerance to Zocor, Lipitor. Had muscle aches on higher dose Crestor.   Hypertension    Hypothyroidism    LBBB (left bundle branch block)    a. Transient during 02/2014 admission.   NSTEMI (non-ST elevated myocardial infarction) ([place]) 2014; 12/2013, 01/2014   QT prolongation    a. Noted on EKG 02/2014.   Sinus bradycardia    a. HR 40s-50s during 02/2014 admission, limiting BB dose.   TIA (transient ischemic attack) 1990's   a. TIA vs stroke 1990's "mild" - sounds like a TIA as she was told she had stroke symptoms but negative scans. Denies residual effects.   Type II diabetes mellitus [facility])    Past Surgical History:  Procedure Laterality Date   ABDOMINAL HYSTERECTOMY     complete   AMPUTATION TOE Right 02/25/2021   hammer toe.   [place] CATHETERIZATION  02/24/2014   Procedure: CORONARY BALLOON ANGIOPLASTY;  Surgeon: Burnell Blanks, MD;  Location: [facility] CATH LAB;  Service: Cardiovascular;;   CARDIAC CATHETERIZATION N/A 04/20/2015   Procedure: Left Heart Cath and Coronary Angiography;  Surgeon: Belva Crome, MD;  Location: [place] CV LAB;  Service: Cardiovascular;  Laterality: N/A;   CATARACT EXTRACTION W/ INTRAOCULAR LENS  IMPLANT, BILATERAL Bilateral 2013   CESAREAN SECTION  1959; [place]  12/2013   POBA - OM2   CORONARY ARTERY BYPASS GRAFT N/A 04/25/2015   Procedure: CORONARY ARTERY BYPASS GRAFTING (CABG) x four,  using left internal mammary artery and right leg greater saphenous vein harvested endoscopically;  Surgeon: Gaye Pollack, MD;  Location: [place];  Service: Open Heart  Surgery;  Laterality: N/A;   JOINT REPLACEMENT Right 10 years ago    right knee (TKR)   LEFT HEART CATHETERIZATION WITH CORONARY ANGIOGRAM N/A 01/22/2014   Procedure: LEFT HEART CATHETERIZATION WITH CORONARY ANGIOGRAM;  Surgeon: Sinclair Grooms, MD;  [facility] C[facility] LAB;  Service: Cardiovascular;  Laterality: N/A;   LEFT HEART CATHETERIZATION WITH CORONARY ANGIOGRAM N/A 02/17/2014   Procedure: LEFT HEART CATHETERIZATION WITH CORONARY ANGIOGRAM;  Surgeon: Burnell Blanks, MD;  Location: [facility] CATH LAB;  Service: Cardiovascular;  Laterality: N/A;   LEFT HEART CATHETERIZATION WITH CORONARY ANGIOGRAM N/A 02/24/2014   Procedure: LEFT HEART CATHETERIZATION WITH CORONARY ANGIOGRAM;  Surgeon: Burnell Blanks, MD;  Location: [facility] CATH LAB;  Service: Cardiovascular;  Laterality: N/A;   LUMBAR LAMINECTOMY/DECOMPRESSION MICRODISCECTOMY Left 12/22/2014   Procedure: CENTRAL DECOMPRESSIVE LUMBAR, AND LAMINECTOMY L5-S1,  S1-S2 FOR SPINAL STENOSIS FORAMINOTOMY L5, S1, S2 ROOTS ON LEFT FOR FORAMINAL STENOSIS, AND DECOMPRESSION L5-S1, S1-S2 ACCORDING TO LABELING OF [place] X-RAYS;  Surgeon: Latanya Maudlin, MD;  Location: [facility];  Service: Orthopedics;  Laterality: Left;   PERCUTANEOUS CORONARY STENT INTERVENTION (PCI-S)  02/17/2014   For restenosis of OM2 - PCI with Xience Apline DES 2.25 mm x 12 mm & 2.25 mm x 8 mm overlapping   REDUCTION MAMMAPLASTY Bilateral 1990's   SHOULDER OPEN ROTATOR CUFF REPAIR Bilateral 1980's - 1990's   TEE WITHOUT CARDIOVERSION N/A 04/25/2015   Procedure: TRANSESOPHAGEAL ECHOCARDIOGRAM (TEE);  Surgeon: Gaye Pollack, MD;  Location: [place];  Service: Open Heart Surgery;  Laterality: N/A;   TOTAL KNEE ARTHROPLASTY Right 2009   TOTAL KNEE ARTHROPLASTY Left 04/21/2018   TKR;  Surgeon: Vickey Huger, MD;  Location: [facility];  Service: Orthopedics;  Laterality: Left;  Adductor Block    Family History  Problem Relation Age of Onset   Hypertension Mother    Diabetes  Mother    Heart attack Mother 3   Heart disease Father    Emphysema Father    Diabetes Brother    Heart disease Brother    Cancer Sister    Stroke Neg Hx    Social History   Socioeconomic History   Marital status: Widowed    Spouse name: Not on file   Number of children: 2   Years of education: Not on file   Highest education level: Not on file  Occupational History   Occupation: Retired  Tobacco Use   Smoking status: Never   Smokeless tobacco: Never  Vaping Use   Vaping Use: Never used  Substance and Sexual Activity   Alcohol use: No   Drug use: No   Sexual activity: Never  Other Topics Concern   Not on file  Social History Narrative   Lives in [place] by herself.    Social Determinants of Health   Financial Resource Strain: Low Risk  (10/06/2021)   Overall Financial Resource Strain (CARDIA)    Difficulty of Paying Living Expenses: Not hard at all  Food Insecurity: No Food Insecurity (10/06/2021)   Hunger Vital Sign    Worried About Running Out of Food in the Last Year: Never true    Ran Out of Food in the Last Year: Never true  Transportation Needs: No Transportation Needs (10/06/2021)   PRAPARE - Hydrologist (Medical): No    Lack of Transportation (Non-Medical): No  Physical Activity: Sufficiently Active (10/06/2021)   Exercise Vital Sign    Days of Exercise per Week: 6 days    Minutes of Exercise per Session: 40 min  Stress: No Stress Concern Present (10/06/2021)   [place]    Feeling of Stress : Not at all  Social Connections: Not on file    Review of Systems   Objective:  There  were no vitals taken for this visit.     01/14/2022    8:46 PM 01/11/2022    3:41 PM 12/22/2021    8:43 AM  BP/Weight  Systolic BP 300  762  Diastolic BP 60  60  Wt. (Lbs) 200 200 200.4  BMI 31.32 kg/m2 31.32 kg/m2 31.39 kg/m2    Physical Exam  Diabetic Foot Exam - Simple    No data filed      Lab Results  Component Value Date   WBC 7.1 01/05/2022   HGB 11.8 01/05/2022   HCT 35.0 01/05/2022   PLT 201 01/05/2022   GLUCOSE 258 (H) 02/08/2022   CHOL 148 01/05/2022   TRIG 125 01/05/2022   HDL 44 01/05/2022   LDLCALC 82 01/05/2022   ALT 10 01/05/2022   AST 14 01/05/2022   NA 139 02/08/2022   K 4.5 02/08/2022   CL 99 02/08/2022   CREATININE 1.58 (H) 02/08/2022   BUN 41 (H) 02/08/2022   CO2 24 02/08/2022   TSH 3.250 09/29/2021   INR 0.94 04/21/2018   HGBA1C 7.0 (H) 01/05/2022   MICROALBUR 2.1 (H) 07/26/2014      Assessment & Plan:   Problem List Items Addressed This Visit   None .  No orders of the defined types were placed in this encounter.   No orders of the defined types were placed in this encounter.    Follow-up: No follow-ups on file.  An After Visit Summary was printed and given to the patient.  Rochel Brome, MD [facility] [phone]

## 2022-03-13 ENCOUNTER — Telehealth: Payer: Self-pay | Admitting: Interventional Cardiology

## 2022-03-13 LAB — CBC WITH DIFFERENTIAL/PLATELET
Basophils Absolute: 0.1 10*3/uL (ref 0.0–0.2)
Basos: 1 %
EOS (ABSOLUTE): 0.1 10*3/uL (ref 0.0–0.4)
Eos: 1 %
Hematocrit: 32.6 % — ABNORMAL LOW (ref 34.0–46.6)
Hemoglobin: 11.1 g/dL (ref 11.1–15.9)
Immature Grans (Abs): 0 10*3/uL (ref 0.0–0.1)
Immature Granulocytes: 1 %
Lymphocytes Absolute: 1.5 10*3/uL (ref 0.7–3.1)
Lymphs: 21 %
MCH: 31.1 pg (ref 26.6–33.0)
MCHC: 34 g/dL (ref 31.5–35.7)
MCV: 91 fL (ref 79–97)
Monocytes Absolute: 0.7 10*3/uL (ref 0.1–0.9)
Monocytes: 9 %
Neutrophils Absolute: 5 10*3/uL (ref 1.4–7.0)
Neutrophils: 67 %
Platelets: 217 10*3/uL (ref 150–450)
RBC: 3.57 x10E6/uL — ABNORMAL LOW (ref 3.77–5.28)
RDW: 13.2 % (ref 11.7–15.4)
WBC: 7.3 10*3/uL (ref 3.4–10.8)

## 2022-03-13 LAB — COMPREHENSIVE METABOLIC PANEL
ALT: 11 IU/L (ref 0–32)
AST: 18 IU/L (ref 0–40)
Albumin/Globulin Ratio: 1.7 (ref 1.2–2.2)
Albumin: 4 g/dL (ref 3.7–4.7)
Alkaline Phosphatase: 146 IU/L — ABNORMAL HIGH (ref 44–121)
BUN/Creatinine Ratio: 21 (ref 12–28)
BUN: 35 mg/dL — ABNORMAL HIGH (ref 8–27)
Bilirubin Total: 0.3 mg/dL (ref 0.0–1.2)
CO2: 20 mmol/L (ref 20–29)
Calcium: 9.2 mg/dL (ref 8.7–10.3)
Chloride: 102 mmol/L (ref 96–106)
Creatinine, Ser: 1.7 mg/dL — ABNORMAL HIGH (ref 0.57–1.00)
Globulin, Total: 2.4 g/dL (ref 1.5–4.5)
Glucose: 132 mg/dL — ABNORMAL HIGH (ref 70–99)
Potassium: 4.5 mmol/L (ref 3.5–5.2)
Sodium: 142 mmol/L (ref 134–144)
Total Protein: 6.4 g/dL (ref 6.0–8.5)
eGFR: 29 mL/min/{1.73_m2} — ABNORMAL LOW (ref >59)

## 2022-03-13 LAB — SEDIMENTATION RATE: Sed Rate: 20 mm/hr (ref 0–40)

## 2022-03-13 LAB — ROCKY MTN SPOTTED FVR AB, IGM-BLOOD: RMSF IgM: 1.08 index — ABNORMAL HIGH (ref 0.00–0.89)

## 2022-03-13 LAB — LYME DISEASE SEROLOGY W/REFLEX: Lyme Total Antibody EIA: NEGATIVE

## 2022-03-13 NOTE — Telephone Encounter (Signed)
**Note De-Identified Carol [name] Obfuscation** I called the pt and advised her that we have not received an application from her Carol [name] fax.  She states that a friend took it with her to work and was to fax it to [place] from there.  She could not tell me the fax number her friend was to fax her application to as she states she gave it to her friend and did not write the number down for herself.  I gave her our fax number [phone]) and she repeated it back to me correctly. She states that she will talk with her friend and that they will fax it to [place] again at this fax number.  She thanked me for calling her back.

## 2022-03-13 NOTE — Telephone Encounter (Signed)
Pt states she faxed a paper to fill out for help with a prescription and she wanted to make sure that it was received. Please call back.

## 2022-03-13 NOTE — Progress Notes (Signed)
Blood count normal.  Liver function normal.  Kidney function abnormal. Worsening. No nsaids. Hydrate.  Labs positive for RMSF. Must complete doxycycline. Other symptoms she may develop are fever and headaches. Please let us know if she does not continue to improve.  Negative for lyme. Negative esr (inflammatory marker.)

## 2022-03-14 ENCOUNTER — Ambulatory Visit: Payer: Medicare HMO | Admitting: Physician Assistant

## 2022-03-14 ENCOUNTER — Telehealth: Payer: Self-pay | Admitting: Interventional Cardiology

## 2022-03-14 DIAGNOSIS — R21 Rash and other nonspecific skin eruption: Secondary | ICD-10-CM | POA: Insufficient documentation

## 2022-03-14 NOTE — Assessment & Plan Note (Addendum)
DDX: RMSF, LYME DISEASE, VASCULITIS.  CHANGE BACTRIM TO DOXY. START PREDNISONE LABS ORDERED.

## 2022-03-14 NOTE — Telephone Encounter (Signed)
Spoke with pt who states she has bilateral swelling of her feet ankles and legs up to her calf.  She has also developed a rash of her lower extremities.  She denies CP, SOB or dizziness.  She states she was up most of the night last night urinating and has lost 4 pounds since yesterday.  Her weight was up 10 pounds over the past 8 days.  She has seen urgent care and her primary care provider this week.  She is asking if she should increase her Furosemide. Reviewing labs completed at her PCP office reveals pt positive for RMSF.  Pt advised she should continue current medications as prescribed and to be sure she finishes Doxycycline.  Rash and swelling should improve and if symptoms increase or worsen she should contact her PCP office as advised by her PCP.  Pt verbalizes understanding and agrees with current plan.

## 2022-03-14 NOTE — Telephone Encounter (Signed)
Pt c/o swelling: STAT is pt has developed SOB within 24 hours  If swelling, where is the swelling located? Legs and feet  How much weight have you gained and in what time span? Thinks 12 pounds not sure the time span  Have you gained 3 pounds in a day or 5 pounds in a week? 10 pounds in 5 days  Do you have a log of your daily weights (if so, list)?  197 lbs Friday when she went to urgent care, 207 lbs yesterday, has not weighed this morning Are you currently taking a fluid pill? yes  Are you currently SOB? no  Have you traveled recently? no  Patient states she has swelling in her legs and feet. She says it was just her left leg at first but now her right leg has it as well. She says she also has broken out in red blotches on her legs and it is worse on the back of them. She says she went to urgent care and they have no idea what it is. She says Monday she saw her PCP and they also do not know what it is. She says the labs came back normal except her kidney's. She says they asked if she had been bitten by a tick, but she has not found a tick on her. She says she was put on 2 different medications. She was put on prednisone 10 mg 1 x a day, the other one is doxycycline hyclate 100 mg 2x a day. She would like to know if he wants her to take a half tablet more of the fluid pill.

## 2022-03-14 NOTE — Assessment & Plan Note (Signed)
INCREASE INSULIN IN AM AND PM BY 5 U WHILE ON PREDNISONE

## 2022-03-30 DIAGNOSIS — E1122 Type 2 diabetes mellitus with diabetic chronic kidney disease: Secondary | ICD-10-CM | POA: Diagnosis not present

## 2022-04-02 NOTE — Telephone Encounter (Signed)
Patient is following up, requesting an update on patient assistance forms. Have they been received? Please advise.

## 2022-04-03 NOTE — Telephone Encounter (Signed)
**Note De-Identified Carol [name] Obfuscation** The pt states that her friend faxed the providers page of a BI cares application for Jardiance to the office for her about 3 weeks ago but we have not received it.  I advised her that I can print a providers page of a [employer] Cares Pt Asst application, complete it, and have Dr Tamala Julian sign and date it.  The pt does not drive so it is very difficult for her to get to the office to pick up the completed providers page so I advised her that we will fax it to [facility] with a note on the cover letter letting them  know that the pt is mailing her part of the application to them herself.  The pt was grateful for our help and is in agreement with this plan. She states that she is mailing her part of the application to [employer] today.

## 2022-04-05 NOTE — Telephone Encounter (Signed)
Patient assistance forms signed and faxed. 

## 2022-04-09 ENCOUNTER — Other Ambulatory Visit: Payer: Medicare HMO

## 2022-04-16 ENCOUNTER — Other Ambulatory Visit: Payer: Medicare HMO

## 2022-04-16 DIAGNOSIS — E7800 Pure hypercholesterolemia, unspecified: Secondary | ICD-10-CM

## 2022-04-16 DIAGNOSIS — E1140 Type 2 diabetes mellitus with diabetic neuropathy, unspecified: Secondary | ICD-10-CM | POA: Diagnosis not present

## 2022-04-16 DIAGNOSIS — I130 Hypertensive heart and chronic kidney disease with heart failure and stage 1 through stage 4 chronic kidney disease, or unspecified chronic kidney disease: Secondary | ICD-10-CM

## 2022-04-16 DIAGNOSIS — Z794 Long term (current) use of insulin: Secondary | ICD-10-CM | POA: Diagnosis not present

## 2022-04-16 DIAGNOSIS — E039 Hypothyroidism, unspecified: Secondary | ICD-10-CM

## 2022-04-17 LAB — CBC WITH DIFFERENTIAL/PLATELET
Basophils Absolute: 0 10*3/uL (ref 0.0–0.2)
Basos: 1 %
EOS (ABSOLUTE): 0 10*3/uL (ref 0.0–0.4)
Eos: 1 %
Hematocrit: 35.7 % (ref 34.0–46.6)
Hemoglobin: 11.7 g/dL (ref 11.1–15.9)
Immature Grans (Abs): 0 10*3/uL (ref 0.0–0.1)
Immature Granulocytes: 0 %
Lymphocytes Absolute: 1.6 10*3/uL (ref 0.7–3.1)
Lymphs: 28 %
MCH: 30.8 pg (ref 26.6–33.0)
MCHC: 32.8 g/dL (ref 31.5–35.7)
MCV: 94 fL (ref 79–97)
Monocytes Absolute: 0.7 10*3/uL (ref 0.1–0.9)
Monocytes: 12 %
Neutrophils Absolute: 3.2 10*3/uL (ref 1.4–7.0)
Neutrophils: 58 %
Platelets: 215 10*3/uL (ref 150–450)
RBC: 3.8 x10E6/uL (ref 3.77–5.28)
RDW: 13.9 % (ref 11.7–15.4)
WBC: 5.5 10*3/uL (ref 3.4–10.8)

## 2022-04-17 LAB — LIPID PANEL
Chol/HDL Ratio: 4 ratio (ref 0.0–4.4)
Cholesterol, Total: 171 mg/dL (ref 100–199)
HDL: 43 mg/dL (ref >39)
LDL Chol Calc (NIH): 102 mg/dL — ABNORMAL HIGH (ref 0–99)
Triglycerides: 145 mg/dL (ref 0–149)
VLDL Cholesterol Cal: 26 mg/dL (ref 5–40)

## 2022-04-17 LAB — CARDIOVASCULAR RISK ASSESSMENT

## 2022-04-17 LAB — COMPREHENSIVE METABOLIC PANEL
ALT: 17 IU/L (ref 0–32)
AST: 23 IU/L (ref 0–40)
Albumin/Globulin Ratio: 1.7 (ref 1.2–2.2)
Albumin: 4 g/dL (ref 3.7–4.7)
Alkaline Phosphatase: 121 IU/L (ref 44–121)
BUN/Creatinine Ratio: 17 (ref 12–28)
BUN: 22 mg/dL (ref 8–27)
Bilirubin Total: 0.5 mg/dL (ref 0.0–1.2)
CO2: 28 mmol/L (ref 20–29)
Calcium: 9.6 mg/dL (ref 8.7–10.3)
Chloride: 102 mmol/L (ref 96–106)
Creatinine, Ser: 1.28 mg/dL — ABNORMAL HIGH (ref 0.57–1.00)
Globulin, Total: 2.3 g/dL (ref 1.5–4.5)
Glucose: 106 mg/dL — ABNORMAL HIGH (ref 70–99)
Potassium: 4.1 mmol/L (ref 3.5–5.2)
Sodium: 144 mmol/L (ref 134–144)
Total Protein: 6.3 g/dL (ref 6.0–8.5)
eGFR: 41 mL/min/{1.73_m2} — ABNORMAL LOW (ref >59)

## 2022-04-17 LAB — HEMOGLOBIN A1C
Est. average glucose Bld gHb Est-mCnc: 169 mg/dL
Hgb A1c MFr Bld: 7.5 % — ABNORMAL HIGH (ref 4.8–5.6)

## 2022-04-17 LAB — TSH: TSH: 2.58 u[IU]/mL (ref 0.450–4.500)

## 2022-04-18 NOTE — Telephone Encounter (Signed)
Provider page signed by Dr. Caryl Comes and has been faxed to [facility]. Confirmation received that fax was successful.

## 2022-04-18 NOTE — Telephone Encounter (Signed)
**Note De-Identified Dorri Ozturk Obfuscation** Letter received from [facility] stating that they need a prescription for the pts Jardiance.  I called [employer] and s/w Elberta Fortis who advised me that the providers page which includes the [employer] is unreadable.  I have completed the providers page of another [place] application and have e-mailed it to [place], [employer], retail so she can obtain our DOD, Dr Olin Pia signature, date it, and so she can fax to [place] at the fax number written on the cover letter included.

## 2022-04-19 ENCOUNTER — Encounter: Payer: Self-pay | Admitting: Family Medicine

## 2022-04-19 ENCOUNTER — Ambulatory Visit: Payer: Medicare HMO | Admitting: Family Medicine

## 2022-04-19 NOTE — Progress Notes (Signed)
Subjective:  Patient ID: Carol [name], female    DOB: 11-27-1935  Age: 86 y.o. MRN: [id number]  Chief Complaint  Patient presents with   Diabetes   HPI:  CORONARY ARTERY DISEASE/CONGESTIVE HEART FAILURE/hypertension/CKD stage 3b: Stents. 3 heart attacks. CABG. MCHG: Dr. Tamala Julian. On zetia 10 mg daily, plavix 75 mg daily, Imdur 60 mg daily, Carvedilol 6.25 mg one twice daily, and hydralazine 25 mg twice daily, and lasix 80 mg daily. Patient saw Dr. Tamala Julian. Echo 12/2021.  B12 deficiency: Last B12 was > 2000. Patient held B12.    Diabetes: Diabetes: neuropathy.  Last A1C was 7.5. Medications: Novolin 70/30 to 25 U before breakfast and none at Supper unless over 200 and then takes 12 U.  Sugars in am 100-200 CGM: free style libre.  Checking feet for sores.   Constipation: resolved for the most part. Occasionally has to take miralax or linzess.  Neuropathy: takes tramadol 50 mg four times a day as needed severe  'pain. On gabapentin 300 mg twice daily.   Hyperlipidemia: on zetia 10 mg daily,   GERD: on pantoprazole 40 mg  daily and pepcid 10 mg 2 times daily as needed.   Gout: uses colchicine twice daily as needed. ON allopurinol 300 mg qd.   Hypothyroidism: on levothyroxine 50 mcg once daily in am.   Current Outpatient Medications on File Prior to Visit  Medication Sig Dispense Refill   Alcohol Swabs (ALCOHOL WIPES) 70 % PADS USE ONE PAD TO CLEAN AREA TO CHECK BLOOD SUGAR AS  DIRECTED     allopurinol (ZYLOPRIM) 300 MG tablet Take 300 mg by mouth every morning.      Blood Glucose Monitoring Suppl (TRUE METRIX METER) DEVI Use to test blood sugars once daily. ICD: E11.65     calcium carbonate (OSCAL) 1500 (600 Ca) MG TABS tablet Take 600 mg by mouth 2 (two) times daily with a meal.     carvedilol (COREG) 6.25 MG tablet Take 6.25 mg by mouth 2 (two) times daily with a meal.     clopidogrel (PLAVIX) 75 MG tablet Take 1 tablet (75 mg total) by mouth daily. 90 tablet 1   Continuous  Blood Gluc Receiver (FREESTYLE LIBRE READER) DEVI 1 each by Does not apply route as directed. 1 each 0   empagliflozin (JARDIANCE) 10 MG TABS tablet Take 1 tablet (10 mg total) by mouth daily before breakfast. 30 tablet 11   ezetimibe (ZETIA) 10 MG tablet Take 10 mg by mouth daily.     famotidine (PEPCID) 10 MG tablet Take 10 mg by mouth 2 (two) times daily as needed for heartburn or indigestion.      fluticasone (FLONASE) 50 MCG/ACT nasal spray USE 1 SPRAY NASALLY EVERY DAY     furosemide (LASIX) 80 MG tablet Take 80 mg by mouth daily.     gabapentin (NEURONTIN) 300 MG capsule Take 300 mg by mouth 2 (two) times daily.     glucose blood (TRUE METRIX BLOOD GLUCOSE TEST) test strip Use as instructed 100 each 12   hydrALAZINE (APRESOLINE) 25 MG tablet Take 25 mg by mouth 2 (two) times daily.     insulin NPH-regular Human (NOVOLIN 70/30) (70-30) 100 UNIT/ML injection Inject 25-32 Units into the skin daily. Reports 20 units in morning and 10-12 at night if needed.     isosorbide mononitrate (IMDUR) 60 MG 24 hr tablet Take 1 tablet (60 mg total) by mouth daily. 90 tablet 1   levothyroxine (SYNTHROID, LEVOTHROID) 50 MCG tablet  Take 50 mcg by mouth every morning.      lisinopril (ZESTRIL) 5 MG tablet Take 5 mg by mouth daily.     methocarbamol (ROBAXIN) 500 MG tablet Take 1-2 tablets (500-1,000 mg total) by mouth every 6 (six) hours as needed for muscle spasms. 60 tablet 0   nitroGLYCERIN (NITROSTAT) 0.4 MG SL tablet Place 0.4 mg under the tongue as needed.     pantoprazole (PROTONIX) 40 MG tablet TAKE 1 TABLET BY MOUTH ONCE DAILY 30 tablet 10   potassium chloride (KLOR-CON) 10 MEQ tablet Take 10 mEq by mouth daily.     TRADJENTA 5 MG TABS tablet Take 5 mg by mouth daily.     TRUEplus Lancets 33G MISC Use to test blood sugar once daily. ICD: E11.65     Biotin 5000 MCG CAPS Take 5,000 mcg by mouth every morning.  (Patient not taking: Reported on 04/20/2022)     colchicine 0.6 MG tablet Take 1 tablet (0.6  mg total) by mouth 2 (two) times daily as needed for up to 7 days (gout flare up.). 30 tablet 1   No current facility-administered medications on file prior to visit.   Past Medical History:  Diagnosis Date   Anemia    a. Noted on labs 02/2014 possibly procedurally related.   Asthma    CAD S/P percutaneous coronary angioplasty 01/22/2014   A. (12/2013): POBA - OM2 99% (very tortuous segment) - reduced ~ 50% & TIMI 3 flow, 80-90% stenosis in mid PDA, Irregularities <50% in mRCA, pLAD and prox LCx; b. 02/17/14: Moderate sized fixed defect in Lateral wall --> cath with OM2 restenosis & otherwise stable --> Xience Alpine DES x 2 (2.25 mm x 12 & 8 mm). c. NSTEMI 02/2014 s/p PTCA/balloon angioplasty only to small caliber PDA.   Carotid stenosis    Carotid US 11/17: L 1-39; FU prn   Chronic systolic heart failure ([place])    a. ICM b. ECHO (01/2014): EF 25-30%, grade I DD, trivial MR. c. EF by Myoview 02/17/14 = ~53%. d. EF by cath 02/24/14 - improved to 50-55%.   CKD (chronic kidney disease), stage III (HCC)    Contrast media allergy    Environmental allergies    GERD (gastroesophageal reflux disease)    Gout, unspecified    HOH (hard of hearing)    Hyperlipidemia    a. H/o intolerance to Zocor, Lipitor. Had muscle aches on higher dose Crestor.   Hypertension    Hypothyroidism    LBBB (left bundle branch block)    a. Transient during 02/2014 admission.   NSTEMI (non-ST elevated myocardial infarction) ([place]) 2014; 12/2013, 01/2014   QT prolongation    a. Noted on EKG 02/2014.   Sinus bradycardia    a. HR 40s-50s during 02/2014 admission, limiting BB dose.   TIA (transient ischemic attack) 1990's   a. TIA vs stroke 1990's "mild" - sounds like a TIA as she was told she had stroke symptoms but negative scans. Denies residual effects.   Type II diabetes mellitus [facility])    Past Surgical History:  Procedure Laterality Date   ABDOMINAL HYSTERECTOMY     complete   AMPUTATION TOE Right 02/25/2021   hammer toe.    [place] CATHETERIZATION  02/24/2014   Procedure: CORONARY BALLOON ANGIOPLASTY;  Surgeon: Burnell Blanks, MD;  Location: [facility] CATH LAB;  Service: Cardiovascular;;   CARDIAC CATHETERIZATION N/A 04/20/2015   Procedure: Left Heart Cath and Coronary Angiography;  Surgeon: Mallie Mussel  Nicholes Stairs, MD;  Location: [place] CV LAB;  Service: Cardiovascular;  Laterality: N/A;   CATARACT EXTRACTION W/ INTRAOCULAR LENS  IMPLANT, BILATERAL Bilateral 2013   CESAREAN SECTION  1959; [place]  12/2013   POBA - OM2   CORONARY ARTERY BYPASS GRAFT N/A 04/25/2015   Procedure: CORONARY ARTERY BYPASS GRAFTING (CABG) x four,  using left internal mammary artery and right leg greater saphenous vein harvested endoscopically;  Surgeon: Gaye Pollack, MD;  Location: [place];  Service: Open Heart Surgery;  Laterality: N/A;   JOINT REPLACEMENT Right 10 years ago    right knee (TKR)   LEFT HEART CATHETERIZATION WITH CORONARY ANGIOGRAM N/A 01/22/2014   Procedure: LEFT HEART CATHETERIZATION WITH CORONARY ANGIOGRAM;  Surgeon: Sinclair Grooms, MD;  Location: [facility] CATH LAB;  Service: Cardiovascular;  Laterality: N/A;   LEFT HEART CATHETERIZATION WITH CORONARY ANGIOGRAM N/A 02/17/2014   Procedure: LEFT HEART CATHETERIZATION WITH CORONARY ANGIOGRAM;  Surgeon: Burnell Blanks, MD;  Location: [facility] CATH LAB;  Service: Cardiovascular;  Laterality: N/A;   LEFT HEART CATHETERIZATION WITH CORONARY ANGIOGRAM N/A 02/24/2014   Procedure: LEFT HEART CATHETERIZATION WITH CORONARY ANGIOGRAM;  Surgeon: Burnell Blanks, MD;  Location: [facility] CATH LAB;  Service: Cardiovascular;  Laterality: N/A;   LUMBAR LAMINECTOMY/DECOMPRESSION MICRODISCECTOMY Left 12/22/2014   Procedure: CENTRAL DECOMPRESSIVE LUMBAR, AND LAMINECTOMY L5-S1,  S1-S2 FOR SPINAL STENOSIS FORAMINOTOMY L5, S1, S2 ROOTS ON LEFT FOR FORAMINAL STENOSIS, AND DECOMPRESSION L5-S1, S1-S2 ACCORDING TO LABELING OF [place] X-RAYS;  Surgeon: Latanya Maudlin, MD;  Location: [facility];  Service: Orthopedics;  Laterality: Left;   PERCUTANEOUS CORONARY STENT INTERVENTION (PCI-S)  02/17/2014   For restenosis of OM2 - PCI with Xience Apline DES 2.25 mm x 12 mm & 2.25 mm x 8 mm overlapping   REDUCTION MAMMAPLASTY Bilateral 1990's   SHOULDER OPEN ROTATOR CUFF REPAIR Bilateral 1980's - 1990's   TEE WITHOUT CARDIOVERSION N/A 04/25/2015   Procedure: TRANSESOPHAGEAL ECHOCARDIOGRAM (TEE);  Surgeon: Gaye Pollack, MD;  Location: [place];  Service: Open Heart Surgery;  Laterality: N/A;   TOTAL KNEE ARTHROPLASTY Right 2009   TOTAL KNEE ARTHROPLASTY Left 04/21/2018   TKR;  Surgeon: Vickey Huger, MD;  Location: [facility];  Service: Orthopedics;  Laterality: Left;  Adductor Block    Family History  Problem Relation Age of Onset   Hypertension Mother    Diabetes Mother    Heart attack Mother 46   Heart disease Father    Emphysema Father    Diabetes Brother    Heart disease Brother    Cancer Sister    Stroke Neg Hx    Social History   Socioeconomic History   Marital status: Widowed    Spouse name: Not on file   Number of children: 2   Years of education: Not on file   Highest education level: Not on file  Occupational History   Occupation: Retired  Tobacco Use   Smoking status: Never   Smokeless tobacco: Never  Vaping Use   Vaping Use: Never used  Substance and Sexual Activity   Alcohol use: No   Drug use: No   Sexual activity: Never  Other Topics Concern   Not on file  Social History Narrative   Lives in [place] by herself.    Social Determinants of Health   Financial Resource Strain: Low Risk  (10/06/2021)   Overall Financial Resource Strain (CARDIA)    Difficulty of Paying Living  Expenses: Not hard at all  Food Insecurity: No Food Insecurity (10/06/2021)   Hunger Vital Sign    Worried About Running Out of Food in the Last Year: Never true    Ran Out of Food in the Last Year: Never true   Transportation Needs: No Transportation Needs (10/06/2021)   PRAPARE - Hydrologist (Medical): No    Lack of Transportation (Non-Medical): No  Physical Activity: Sufficiently Active (10/06/2021)   Exercise Vital Sign    Days of Exercise per Week: 6 days    Minutes of Exercise per Session: 40 min  Stress: No Stress Concern Present (10/06/2021)   [place]    Feeling of Stress : Not at all  Social Connections: Not on file    Review of Systems  Constitutional:  Negative for chills, fatigue and fever.  HENT:  Negative for congestion, ear pain and sore throat.   Respiratory:  Negative for cough and shortness of breath.   Cardiovascular:  Negative for chest pain.  Gastrointestinal:  Negative for abdominal pain, constipation, diarrhea, nausea and vomiting.  Genitourinary:  Negative for dysuria and urgency.  Musculoskeletal:  Negative for arthralgias and myalgias.  Skin:  Negative for rash.  Neurological:  Negative for dizziness and headaches.  Psychiatric/Behavioral:  Negative for dysphoric mood. The patient is not nervous/anxious.      Objective:  BP 138/72 (BP Location: Right Arm, Patient Position: Sitting)   Pulse 67   Temp 97.7 F (36.5 C) (Temporal)   Ht '5\' 7"'$  (1.702 m)   Wt 196 lb (88.9 kg)   SpO2 98%   BMI 30.70 kg/m      04/20/2022   12:08 PM 04/20/2022   11:55 AM 03/12/2022    4:26 PM  BP/Weight  Systolic BP 161 096 045  Diastolic BP 72 78 78  Wt. (Lbs)  196 203  BMI  30.7 kg/m2 31.79 kg/m2    Physical Exam Vitals reviewed.  Constitutional:      Appearance: Normal appearance. She is normal weight.  Neck:     Vascular: No carotid bruit.  Cardiovascular:     Rate and Rhythm: Normal rate and regular rhythm.     Heart sounds: Normal heart sounds.  Pulmonary:     Effort: Pulmonary effort is normal. No respiratory distress.     Breath sounds: Normal breath sounds.   Abdominal:     General: Abdomen is flat. Bowel sounds are normal.     Palpations: Abdomen is soft.     Tenderness: There is no abdominal tenderness.  Neurological:     Mental Status: She is alert and oriented to person, place, and time.  Psychiatric:        Mood and Affect: Mood normal.        Behavior: Behavior normal.     Diabetic Foot Exam - Simple   Simple Foot Form Diabetic Foot exam was performed with the following findings: Yes 04/20/2022 12:20 PM  Visual Inspection See comments: Yes Sensation Testing Intact to touch and monofilament testing bilaterally: Yes Pulse Check Posterior Tibialis and Dorsalis pulse intact bilaterally: Yes Comments [place] BL.  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        Lab Results  Component Value Date   WBC 5.5 04/16/2022   HGB 11.7 04/16/2022   HCT 35.7 04/16/2022   PLT 215 04/16/2022   GLUCOSE 106 (H) 04/16/2022   CHOL 171 04/16/2022   TRIG 145  04/16/2022   HDL 43 04/16/2022   LDLCALC 102 (H) 04/16/2022   ALT 17 04/16/2022   AST 23 04/16/2022   NA 144 04/16/2022   K 4.1 04/16/2022   CL 102 04/16/2022   CREATININE 1.28 (H) 04/16/2022   BUN 22 04/16/2022   CO2 28 04/16/2022   TSH 2.580 04/16/2022   INR 0.94 04/21/2018   HGBA1C 7.5 (H) 04/16/2022   MICROALBUR 2.1 (H) 07/26/2014      Assessment & Plan:   Problem List Items Addressed This Visit       Cardiovascular and Mediastinum   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 Continue on Plavix 75 mg daily, Imdur 60 mg daily, Carvedilol 6.25 mg 1 tablet twice daily, Hydralizine 25 mg 1 tablet twice daily, and Lasix 80 mg daily.      RESOLVED: Essential hypertension (Chronic)     Respiratory   Asthma, moderate persistent    Management per specialist.       Relevant Medications   albuterol (VENTOLIN HFA) 108 (90 Base) MCG/ACT inhaler     Digestive   GERD  (gastroesophageal reflux disease) - Primary    The current medical regimen is effective; continue present plan and medications. Continue Protonix and pepcid.        Endocrine   Acquired hypothyroidism    Previously well controlled Continue Synthroid at current dose  Recheck TSH and adjust Synthroid as indicated        Controlled type 2 diabetes mellitus with diabetic neuropathy, with long-term current use of insulin (HCC)    Control: Fair Recommend check sugars fasting daily. Recommend check feet daily. Recommend annual eye exams. Medicines: Continue Novolin 70/30 inject 25-32 units daily, Jardiance 10 mg 1 tablet daily. Continue to work on eating a healthy diet and exercise.  Labs drawn today.        Relevant Orders   Microalbumin / creatinine urine ratio (Completed)     Genitourinary   Stage 3b chronic kidney disease (HCC)    Stable.        Other   Hyperlipidemia    Uncontrolled.  No changes to medicines. Zetia 10 mg daily. Continue to work on eating a healthy diet and exercise.  Labs drawn today.       .  Meds ordered this encounter  Medications   traMADol (ULTRAM) 50 MG tablet    Sig: Take 1 tablet (50 mg total) by mouth every 6 (six) hours as needed.    Dispense:  30 tablet    Refill:  2   albuterol (VENTOLIN HFA) 108 (90 Base) MCG/ACT inhaler    Sig: Inhale 2 puffs into the lungs every 6 (six) hours as needed for wheezing or shortness of breath.    Dispense:  24 g    Refill:  1    90 day rx.    Orders Placed This Encounter  Procedures   Microalbumin / creatinine urine ratio     Follow-up: Return in about 3 months (around 07/21/2022) for chronic fasting, awv appears to be overdue. Please schedule with Maudie Mercury..  An After Visit Summary was printed and given to the patient.  I,Lauren M Auman,acting as a scribe for Rochel Brome, MD.,have documented all relevant documentation on the behalf of Rochel Brome, MD,as directed by  Rochel Brome, MD while in the  presence of Rochel Brome, MD.   Rochel Brome, MD [employer] [phone])  [phone]  

## 2022-04-20 ENCOUNTER — Ambulatory Visit (INDEPENDENT_AMBULATORY_CARE_PROVIDER_SITE_OTHER): Payer: Medicare HMO | Admitting: Family Medicine

## 2022-04-20 ENCOUNTER — Encounter: Payer: Self-pay | Admitting: Family Medicine

## 2022-04-20 VITALS — BP 138/72 | HR 67 | Temp 97.7°F | Ht 67.0 in | Wt 196.0 lb

## 2022-04-20 DIAGNOSIS — E1140 Type 2 diabetes mellitus with diabetic neuropathy, unspecified: Secondary | ICD-10-CM | POA: Diagnosis not present

## 2022-04-20 DIAGNOSIS — E039 Hypothyroidism, unspecified: Secondary | ICD-10-CM

## 2022-04-20 DIAGNOSIS — J4540 Moderate persistent asthma, uncomplicated: Secondary | ICD-10-CM

## 2022-04-20 DIAGNOSIS — Z794 Long term (current) use of insulin: Secondary | ICD-10-CM

## 2022-04-20 DIAGNOSIS — I10 Essential (primary) hypertension: Secondary | ICD-10-CM

## 2022-04-20 DIAGNOSIS — E782 Mixed hyperlipidemia: Secondary | ICD-10-CM | POA: Diagnosis not present

## 2022-04-20 DIAGNOSIS — I130 Hypertensive heart and chronic kidney disease with heart failure and stage 1 through stage 4 chronic kidney disease, or unspecified chronic kidney disease: Secondary | ICD-10-CM | POA: Diagnosis not present

## 2022-04-20 DIAGNOSIS — K219 Gastro-esophageal reflux disease without esophagitis: Secondary | ICD-10-CM | POA: Diagnosis not present

## 2022-04-20 DIAGNOSIS — N1832 Chronic kidney disease, stage 3b: Secondary | ICD-10-CM

## 2022-04-20 MED ORDER — TRAMADOL HCL 50 MG PO TABS: 50.0000 mg | ORAL_TABLET | Freq: Four times a day (QID) | ORAL | 2 refills | Status: DC | PRN

## 2022-04-20 MED ORDER — ALBUTEROL SULFATE HFA 108 (90 BASE) MCG/ACT IN AERS: 2.0000 | INHALATION_SPRAY | Freq: Four times a day (QID) | RESPIRATORY_TRACT | 1 refills | Status: DC | PRN

## 2022-04-20 NOTE — Assessment & Plan Note (Addendum)
Control: Fair Recommend check sugars fasting daily. Recommend check feet daily. Recommend annual eye exams. Medicines: Continue Novolin 70/30 inject 25-32 units daily, Jardiance 10 mg 1 tablet daily. Continue to work on eating a healthy diet and exercise.  Labs drawn today.

## 2022-04-20 NOTE — Assessment & Plan Note (Signed)
Stable

## 2022-04-20 NOTE — Assessment & Plan Note (Signed)
The current medical regimen is effective; continue present plan and medications. Continue Protonix and pepcid.

## 2022-04-20 NOTE — Assessment & Plan Note (Signed)
>>  ASSESSMENT AND PLAN FOR HYPERTENSIVE HEART AND RENAL DISEASE WITH CHF (HCC) WRITTEN ON 04/20/2022 12:02 PM BY Precious Reel, CMA  The current medical regimen is effective; continue present plan and medications. Continue on Plavix 75 mg daily, Imdur 60 mg daily, Carvedilol 6.25 mg 1 tablet twice daily, Hydralizine 25 mg 1 tablet twice daily, and Lasix 80 mg daily.

## 2022-04-20 NOTE — Assessment & Plan Note (Signed)
The current medical regimen is effective; continue present plan and medications. Continue on Plavix 75 mg daily, Imdur 60 mg daily, Carvedilol 6.25 mg 1 tablet twice daily, Hydralizine 25 mg 1 tablet twice daily, and Lasix 80 mg daily.

## 2022-04-22 LAB — MICROALBUMIN / CREATININE URINE RATIO
Creatinine, Urine: 118.2 mg/dL
Microalb/Creat Ratio: 357 mg/g creat — ABNORMAL HIGH (ref 0–29)
Microalbumin, Urine: 421.9 ug/mL

## 2022-04-27 ENCOUNTER — Telehealth: Payer: Self-pay

## 2022-04-27 NOTE — Assessment & Plan Note (Signed)
Management per specialist. 

## 2022-04-27 NOTE — Assessment & Plan Note (Signed)
Uncontrolled.  No changes to medicines. Zetia 10 mg daily. Continue to work on eating a healthy diet and exercise.  Labs drawn today.

## 2022-04-27 NOTE — Telephone Encounter (Signed)
**Note De-Identified Ramzy Cappelletti Obfuscation** Letter received from [facility] [employer] stating that they are missing the pts proof of income.  I checked her [employer] application that she left at the office and she did not leave her proof of income with her application.  The letter states that they have notified the pt of this need as well.

## 2022-04-27 NOTE — Assessment & Plan Note (Signed)
Previously well controlled Continue Synthroid at current dose  Recheck TSH and adjust Synthroid as indicated   

## 2022-05-01 DIAGNOSIS — E1122 Type 2 diabetes mellitus with diabetic chronic kidney disease: Secondary | ICD-10-CM | POA: Diagnosis not present

## 2022-06-01 DIAGNOSIS — E1122 Type 2 diabetes mellitus with diabetic chronic kidney disease: Secondary | ICD-10-CM | POA: Diagnosis not present

## 2022-06-08 NOTE — Telephone Encounter (Signed)
PATIENT WAS APPROVED 05/31/22-06/24/23 PHONE # [phone]

## 2022-06-22 ENCOUNTER — Telehealth: Payer: Self-pay

## 2022-06-22 NOTE — Patient Outreach (Signed)
  Care Coordination   Initial Visit Note   06/22/2022 Name: Carol [name] MRN: [id number] DOB: 06-Nov-1935  Carol [name] is a 86 y.o. year old female who sees Cox, Kirsten, MD for primary care. I spoke with  Carol [name] by phone today.  What matters to the patients health and wellness today?  Placed call to patient today to review and offer [place] Coordination program.  Patient reports to me that she is doing ok except for her passing out spells. States that her blood pressure has been low. Today's reading of 89/42.  Reports BP has been low for a while.  States she thinks the lisinopril is too much for her. States she took today's dose but is not taking any more.  Reports CBG 120's.  C/O back pain. Taking tylenol and tramadol for pain.  Continues to be active.     Goals Addressed               This Visit's Progress     COMPLETED: " I am having passing out spells" (pt-stated)        Care Coordination Interventions: Advised patient to monitor her blood pressure and to call MD office when BP is low Reviewed scheduled/upcoming provider appointments including PCP follow up Encouraged patient to eat and drink well Reviewed when to call 911 and MD office Message sent to MD to make aware of passing out spells. Patient denies need for return call           SDOH assessments and interventions completed:  No     Care Coordination Interventions:  Yes, provided   Follow up plan: No further intervention required.   Encounter Outcome:  Pt. Visit Completed   Tomasa Rand, RN, BSN, CEN [place] Coordinator [phone]

## 2022-06-22 NOTE — Patient Outreach (Signed)
  Care Coordination   Follow Up Visit Note   06/22/2022 Name: ALANYA VUKELICH MRN: [id number] DOB: 08-04-1935  KALYSTA KNEISLEY is a 86 y.o. year old female who sees Cox, Kirsten, MD for primary care. I spoke with  [name] by phone today.  What matters to the patients health and wellness today?  Care coordination. Message from MD. Called patient back with recommendation. Patient refuses to go to the urgent care or ED.  Reviewed importance of eating and drinking well. Reviewed fall prevention. Reviewed with patient the importance of calling 911 if needed.     SDOH assessments and interventions completed:  No     Care Coordination Interventions:  Yes, provided   Follow up plan: No further intervention required.   Encounter Outcome:  Pt. Visit Completed   Tomasa Rand, RN, BSN, CEN [place] Coordinator [phone]

## 2022-07-10 ENCOUNTER — Other Ambulatory Visit: Payer: Self-pay | Admitting: Family Medicine

## 2022-07-12 DIAGNOSIS — E113593 Type 2 diabetes mellitus with proliferative diabetic retinopathy without macular edema, bilateral: Secondary | ICD-10-CM | POA: Diagnosis not present

## 2022-07-12 DIAGNOSIS — H4312 Vitreous hemorrhage, left eye: Secondary | ICD-10-CM | POA: Diagnosis not present

## 2022-07-12 DIAGNOSIS — E113591 Type 2 diabetes mellitus with proliferative diabetic retinopathy without macular edema, right eye: Secondary | ICD-10-CM | POA: Diagnosis not present

## 2022-07-12 DIAGNOSIS — E113592 Type 2 diabetes mellitus with proliferative diabetic retinopathy without macular edema, left eye: Secondary | ICD-10-CM | POA: Diagnosis not present

## 2022-07-15 NOTE — Progress Notes (Signed)
Subjective:  Patient ID: Carol [name], female    DOB: 1935-07-02  Age: 87 y.o. MRN: [id number]  Chief Complaint  Patient presents with  . Diabetes  . Hyperlipidemia    HPI  CORONARY ARTERY DISEASE/CONGESTIVE HEART FAILURE/hypertension/CKD stage 3b: Stents. 3 heart attacks. CABG. MCHG: Dr. Tamala Julian. On zetia 10 mg daily, plavix 75 mg daily, Imdur 60 mg daily, Carvedilol 6.25 mg one twice daily, and lasix 80 mg daily. Patient saw Dr. Tamala Julian. Echo 7/20. NTG Sl has with her.   Diabetes: Diabetes: neuropathy.  Last A1C was 7.5. Medications: Novolin 70/30 to 25 U before breakfast and tradjenta 5 mg daily, Jardiance 10 mg daily. .  Glucose log: check once daily in am. 64-235. CGM: free style libre.  Checking feet for sores.  Eats twice a day. Not eating her vegetables. Eating chicken breast, potatoes (air fry.) no fruits.   Constipation: resolved for the most part. Occasionally has to take miralax or linzess.  Neuropathy: takes tramadol 50 mg four times a day as needed severe pain. On gabapentin 300 mg twice daily. .   Hyperlipidemia: on zetia 10 mg daily, Intolerant to statins.   GERD: on pantoprazole 40 mg  daily and pepcid 10 mg 2 times daily as needed.   Gout: Had a gout flare on Saturday. She had to take colchicine twice daily x 3 d. ON allopurinol 300 mg qd.   Hypothyroidism: on levothyroxine 50 mcg once daily in am.    Patient feels dizzy and light headed for few seconds. Her vision will go black and has to grab on to something and then it will pass. Has had 2 falls in the last 6 months when she was bending over. She hit her forehead when [place] the [place]. Has been worsening over the last 6 weeks, but has had it for a year. Has it happen 3-4 times per week.   Complaining of entire back hurts, especially in her neck. Patient was seeing Dr. Tamala Julian, cardiology,  in [place], [place], but he has retired.   Eyes having decreased vision due to macular degeneration and  diabetic changes. She got injections last week and her vision is improving.   Current Outpatient Medications on File Prior to Visit  Medication Sig Dispense Refill  . albuterol (VENTOLIN HFA) 108 (90 Base) MCG/ACT inhaler Inhale 2 puffs into the lungs every 6 (six) hours as needed for wheezing or shortness of breath. 24 g 1  . Alcohol Swabs (ALCOHOL WIPES) 70 % PADS USE ONE PAD TO CLEAN AREA TO CHECK BLOOD SUGAR AS  DIRECTED    . allopurinol (ZYLOPRIM) 300 MG tablet Take 300 mg by mouth every morning.     . Biotin 5000 MCG CAPS Take 5,000 mcg by mouth every morning.    . Blood Glucose Monitoring Suppl (TRUE METRIX METER) DEVI Use to test blood sugars once daily. ICD: E11.65    . carvedilol (COREG) 6.25 MG tablet Take 6.25 mg by mouth 2 (two) times daily with a meal.    . clopidogrel (PLAVIX) 75 MG tablet TAKE 1 TABLET EVERY DAY (NEED TO SCHEDULE A FOLLOW UP VISIT) 90 tablet 3  . Continuous Blood Gluc Receiver (FREESTYLE LIBRE READER) DEVI 1 each by Does not apply route as directed. 1 each 0  . empagliflozin (JARDIANCE) 10 MG TABS tablet Take 1 tablet (10 mg total) by mouth daily before breakfast. 30 tablet 11  . ezetimibe (ZETIA) 10 MG tablet Take 10 mg by mouth daily.    Marland Kitchen  famotidine (PEPCID) 10 MG tablet Take 10 mg by mouth 2 (two) times daily as needed for heartburn or indigestion.     . fluticasone (FLONASE) 50 MCG/ACT nasal spray USE 1 SPRAY NASALLY EVERY DAY    . gabapentin (NEURONTIN) 300 MG capsule Take 300 mg by mouth 2 (two) times daily.    Marland Kitchen glucose blood (TRUE METRIX BLOOD GLUCOSE TEST) test strip Use as instructed 100 each 12  . insulin NPH-regular Human (NOVOLIN 70/30) (70-30) 100 UNIT/ML injection Inject 25-32 Units into the skin daily. Reports 20 units in morning and 10-12 at night if needed.    . isosorbide mononitrate (IMDUR) 60 MG 24 hr tablet TAKE 1 TABLET EVERY DAY 90 tablet 3  . levothyroxine (SYNTHROID, LEVOTHROID) 50 MCG tablet Take 50 mcg by mouth every morning.     Marland Kitchen  lisinopril (ZESTRIL) 5 MG tablet Take 5 mg by mouth daily.    . methocarbamol (ROBAXIN) 500 MG tablet Take 1-2 tablets (500-1,000 mg total) by mouth every 6 (six) hours as needed for muscle spasms. 60 tablet 0  . potassium chloride (KLOR-CON) 10 MEQ tablet Take 10 mEq by mouth daily.    . traMADol (ULTRAM) 50 MG tablet Take 1 tablet (50 mg total) by mouth every 6 (six) hours as needed. 30 tablet 2  . TRUEplus Lancets 33G MISC Use to test blood sugar once daily. ICD: E11.65    . calcium carbonate (OSCAL) 1500 (600 Ca) MG TABS tablet Take 600 mg by mouth 2 (two) times daily with a meal. (Patient not taking: Reported on 07/16/2022)    . colchicine 0.6 MG tablet Take 1 tablet (0.6 mg total) by mouth 2 (two) times daily as needed for up to 7 days (gout flare up.). 30 tablet 1  . hydrALAZINE (APRESOLINE) 25 MG tablet Take 25 mg by mouth 2 (two) times daily. (Patient not taking: Reported on 07/16/2022)    . nitroGLYCERIN (NITROSTAT) 0.4 MG SL tablet Place 0.4 mg under the tongue every 5 (five) minutes x 3 doses as needed for chest pain.     No current facility-administered medications on file prior to visit.   Past Medical History:  Diagnosis Date  . Anemia    a. Noted on labs 02/2014 possibly procedurally related.  . Asthma   . CAD S/P percutaneous coronary angioplasty 01/22/2014   A. (12/2013): POBA - OM2 99% (very tortuous segment) - reduced ~ 50% & TIMI 3 flow, 80-90% stenosis in mid PDA, Irregularities <50% in mRCA, pLAD and prox LCx; b. 02/17/14: Moderate sized fixed defect in Lateral wall --> cath with OM2 restenosis & otherwise stable --> Xience Alpine DES x 2 (2.25 mm x 12 & 8 mm). c. NSTEMI 02/2014 s/p PTCA/balloon angioplasty only to small caliber PDA.  . Carotid stenosis    Carotid US 11/17: L 1-39; FU prn  . Chronic systolic heart failure ([place])    a. ICM b. ECHO (01/2014): EF 25-30%, grade I DD, trivial MR. c. EF by Myoview 02/17/14 = ~53%. d. EF by cath 02/24/14 - improved to 50-55%.  . CKD  (chronic kidney disease), stage III ([place])   . Contrast media allergy   . Environmental allergies   . GERD (gastroesophageal reflux disease)   . Gout, unspecified   . HOH (hard of hearing)   . Hyperlipidemia    a. H/o intolerance to Zocor, Lipitor. Had muscle aches on higher dose Crestor.  . Hypertension   . Hypothyroidism   . LBBB (left bundle branch block)  a. Transient during 02/2014 admission.  . NSTEMI (non-ST elevated myocardial infarction) ([place]) 2014; 12/2013, 01/2014  . QT prolongation    a. Noted on EKG 02/2014.  Marland Kitchen Sinus bradycardia    a. HR 40s-50s during 02/2014 admission, limiting BB dose.  Marland Kitchen TIA (transient ischemic attack) 1990's   a. TIA vs stroke 1990's "mild" - sounds like a TIA as she was told she had stroke symptoms but negative scans. Denies residual effects.  . Type II diabetes mellitus ([place])    Past Surgical History:  Procedure Laterality Date  . ABDOMINAL HYSTERECTOMY     complete  . AMPUTATION TOE Right 02/25/2021   hammer toe.  . APPENDECTOMY  1959  . CARDIAC CATHETERIZATION  02/24/2014   Procedure: CORONARY BALLOON ANGIOPLASTY;  Surgeon: Burnell Blanks, MD;  Location: [facility] CATH LAB;  Service: Cardiovascular;;  . CARDIAC CATHETERIZATION N/A 04/20/2015   Procedure: Left Heart Cath and Coronary Angiography;  Surgeon: Belva Crome, MD;  Location: [facility] CV LAB;  Service: Cardiovascular;  Laterality: N/A;  . CATARACT EXTRACTION W/ INTRAOCULAR LENS  IMPLANT, BILATERAL Bilateral 2013  . [place]; 1960  . CHOLECYSTECTOMY  1989  . CORONARY ANGIOPLASTY  12/2013   POBA - OM2  . CORONARY ARTERY BYPASS GRAFT N/A 04/25/2015   Procedure: CORONARY ARTERY BYPASS GRAFTING (CABG) x four,  using left internal mammary artery and right leg greater saphenous vein harvested endoscopically;  Surgeon: Gaye Pollack, MD;  Location: [facility] OR;  Service: Open Heart Surgery;  Laterality: N/A;  . JOINT REPLACEMENT Right 10 years ago    right knee (TKR)  . LEFT  HEART CATHETERIZATION WITH CORONARY ANGIOGRAM N/A 01/22/2014   Procedure: LEFT HEART CATHETERIZATION WITH CORONARY ANGIOGRAM;  Surgeon: Sinclair Grooms, MD;  Location: [facility] CATH LAB;  Service: Cardiovascular;  Laterality: N/A;  . LEFT HEART CATHETERIZATION WITH CORONARY ANGIOGRAM N/A 02/17/2014   Procedure: LEFT HEART CATHETERIZATION WITH CORONARY ANGIOGRAM;  Surgeon: Burnell Blanks, MD;  Location: [facility] CATH LAB;  Service: Cardiovascular;  Laterality: N/A;  . LEFT HEART CATHETERIZATION WITH CORONARY ANGIOGRAM N/A 02/24/2014   Procedure: LEFT HEART CATHETERIZATION WITH CORONARY ANGIOGRAM;  Surgeon: Burnell Blanks, MD;  Location: [facility] CATH LAB;  Service: Cardiovascular;  Laterality: N/A;  . LUMBAR LAMINECTOMY/DECOMPRESSION MICRODISCECTOMY Left 12/22/2014   Procedure: CENTRAL DECOMPRESSIVE LUMBAR, AND LAMINECTOMY L5-S1,  S1-S2 FOR SPINAL STENOSIS FORAMINOTOMY L5, S1, S2 ROOTS ON LEFT FOR FORAMINAL STENOSIS, AND DECOMPRESSION L5-S1, S1-S2 ACCORDING TO LABELING OF [place] X-RAYS;  Surgeon: Latanya Maudlin, MD;  Location: [facility] ORS;  Service: Orthopedics;  Laterality: Left;  . PERCUTANEOUS CORONARY STENT INTERVENTION (PCI-S)  02/17/2014   For restenosis of OM2 - PCI with Xience Apline DES 2.25 mm x 12 mm & 2.25 mm x 8 mm overlapping  . REDUCTION MAMMAPLASTY Bilateral 1990's  . SHOULDER OPEN ROTATOR CUFF REPAIR Bilateral 1980's - 1990's  . TEE WITHOUT CARDIOVERSION N/A 04/25/2015   Procedure: TRANSESOPHAGEAL ECHOCARDIOGRAM (TEE);  Surgeon: Gaye Pollack, MD;  Location: [facility];  Service: Open Heart Surgery;  Laterality: N/A;  . TOTAL KNEE ARTHROPLASTY Right 2009  . TOTAL KNEE ARTHROPLASTY Left 04/21/2018   TKR;  Surgeon: Vickey Huger, MD;  Location: [facility] ORS;  Service: Orthopedics;  Laterality: Left;  Adductor Block    Family History  Problem Relation Age of Onset  . Hypertension Mother   . Diabetes Mother   . Heart attack Mother 18  . Heart disease Father   . Emphysema Father   .  Diabetes Brother   . Heart disease Brother   . Cancer Sister   . Stroke Neg Hx    Social History   Socioeconomic History  . Marital status: Widowed    Spouse name: Not on file  . Number of children: 2  . Years of education: Not on file  . Highest education level: Not on file  Occupational History  . Occupation: Retired  Tobacco Use  . Smoking status: Never  . Smokeless tobacco: Never  Vaping Use  . Vaping Use: Never used  Substance and Sexual Activity  . Alcohol use: No  . Drug use: No  . Sexual activity: Never  Other Topics Concern  . Not on file  Social History Narrative   Lives in [place] by herself.    Social Determinants of Health   Financial Resource Strain: Low Risk  (10/06/2021)   Overall Financial Resource Strain (CARDIA)   . Difficulty of Paying Living Expenses: Not hard at all  Food Insecurity: No Food Insecurity (10/06/2021)   Hunger Vital Sign   . Worried About Charity fundraiser in the Last Year: Never true   . Ran Out of Food in the Last Year: Never true  Transportation Needs: No Transportation Needs (10/06/2021)   PRAPARE - Transportation   . Lack of Transportation (Medical): No   . Lack of Transportation (Non-Medical): No  Physical Activity: Sufficiently Active (10/06/2021)   Exercise Vital Sign   . Days of Exercise per Week: 6 days   . Minutes of Exercise per Session: 40 min  Stress: No Stress Concern Present (10/06/2021)   [place]   . Feeling of Stress : Not at all  Social Connections: Not on file    Review of Systems  Constitutional:  Positive for fatigue. Negative for chills and fever.  HENT:  Positive for postnasal drip. Negative for congestion, rhinorrhea and sore throat.   Eyes:  Positive for visual disturbance (visual changes in left eye).  Respiratory:  Positive for shortness of breath. Negative for cough.   Cardiovascular:  Negative for chest pain.  Gastrointestinal:   Positive for constipation. Negative for abdominal pain, diarrhea, nausea and vomiting.  Genitourinary:  Negative for dysuria and urgency.  Musculoskeletal:  Positive for back pain and neck pain. Negative for myalgias.       Pain in left leg and foot.    Neurological:  Positive for weakness and light-headedness. Negative for dizziness and headaches.       Near syncopal episodes daily for the last two weeks.    Psychiatric/Behavioral:  Negative for dysphoric mood. The patient is not nervous/anxious.      Objective:  Temp (!) 95.3 F (35.2 C)   Resp 18   Ht '5\' 7"'$  (1.702 m)   Wt 192 lb (87.1 kg)   BMI 30.07 kg/m      07/16/2022    9:45 AM 04/20/2022   12:08 PM 04/20/2022   11:55 AM  BP/Weight  Systolic BP  250 539  Diastolic BP  72 78  Wt. (Lbs) 192  196  BMI 30.07 kg/m2  30.7 kg/m2    Physical Exam  Diabetic Foot Exam - Simple   Simple Foot Form Diabetic Foot exam was performed with the following findings: Yes 07/16/2022 10:20 AM  Visual Inspection See comments: Yes Sensation Testing Intact to touch and monofilament testing bilaterally: Yes Pulse Check Posterior Tibialis and Dorsalis pulse intact bilaterally: Yes Comments BL large bunions. 2nd toe  missing from right foot.  Very dry skins.       Lab Results  Component Value Date   WBC 5.5 04/16/2022   HGB 11.7 04/16/2022   HCT 35.7 04/16/2022   PLT 215 04/16/2022   GLUCOSE 106 (H) 04/16/2022   CHOL 171 04/16/2022   TRIG 145 04/16/2022   HDL 43 04/16/2022   LDLCALC 102 (H) 04/16/2022   ALT 17 04/16/2022   AST 23 04/16/2022   NA 144 04/16/2022   K 4.1 04/16/2022   CL 102 04/16/2022   CREATININE 1.28 (H) 04/16/2022   BUN 22 04/16/2022   CO2 28 04/16/2022   TSH 2.580 04/16/2022   INR 0.94 04/21/2018   HGBA1C 7.5 (H) 04/16/2022   MICROALBUR 2.1 (H) 07/26/2014      Assessment & Plan:   No problem-specific Assessment & Plan notes found for this encounter.    Total time spent on today's visit was  greater than 30 minutes, including both face-to-face time and nonface-to-face time personally spent on review of chart (labs and imaging), discussing labs and goals, discussing further work-up, treatment options, referrals to specialist if needed, reviewing outside records of pertinent, answering patient's questions, and coordinating care.   Follow-up: Return in about 3 months (around 10/15/2022) for 40 minutes please, chronic fasting.  An After Visit Summary was printed and given to the patient.  Rochel Brome, MD [facility] [phone]

## 2022-07-16 ENCOUNTER — Ambulatory Visit (INDEPENDENT_AMBULATORY_CARE_PROVIDER_SITE_OTHER): Payer: Medicare HMO | Admitting: Family Medicine

## 2022-07-16 ENCOUNTER — Ambulatory Visit: Payer: Medicare HMO | Attending: Family Medicine

## 2022-07-16 VITALS — BP 120/60 | HR 56 | Temp 95.3°F | Resp 18 | Ht 67.0 in | Wt 192.0 lb

## 2022-07-16 DIAGNOSIS — Z794 Long term (current) use of insulin: Secondary | ICD-10-CM

## 2022-07-16 DIAGNOSIS — I130 Hypertensive heart and chronic kidney disease with heart failure and stage 1 through stage 4 chronic kidney disease, or unspecified chronic kidney disease: Secondary | ICD-10-CM

## 2022-07-16 DIAGNOSIS — T466X5A Adverse effect of antihyperlipidemic and antiarteriosclerotic drugs, initial encounter: Secondary | ICD-10-CM

## 2022-07-16 DIAGNOSIS — E1140 Type 2 diabetes mellitus with diabetic neuropathy, unspecified: Secondary | ICD-10-CM

## 2022-07-16 DIAGNOSIS — R42 Dizziness and giddiness: Secondary | ICD-10-CM

## 2022-07-16 DIAGNOSIS — G8929 Other chronic pain: Secondary | ICD-10-CM

## 2022-07-16 DIAGNOSIS — M5440 Lumbago with sciatica, unspecified side: Secondary | ICD-10-CM

## 2022-07-16 DIAGNOSIS — E782 Mixed hyperlipidemia: Secondary | ICD-10-CM | POA: Diagnosis not present

## 2022-07-16 DIAGNOSIS — R001 Bradycardia, unspecified: Secondary | ICD-10-CM

## 2022-07-16 DIAGNOSIS — M791 Myalgia, unspecified site: Secondary | ICD-10-CM

## 2022-07-16 DIAGNOSIS — I2510 Atherosclerotic heart disease of native coronary artery without angina pectoris: Secondary | ICD-10-CM | POA: Diagnosis not present

## 2022-07-16 DIAGNOSIS — E1165 Type 2 diabetes mellitus with hyperglycemia: Secondary | ICD-10-CM | POA: Diagnosis not present

## 2022-07-16 DIAGNOSIS — N1832 Chronic kidney disease, stage 3b: Secondary | ICD-10-CM | POA: Diagnosis not present

## 2022-07-16 DIAGNOSIS — E039 Hypothyroidism, unspecified: Secondary | ICD-10-CM | POA: Diagnosis not present

## 2022-07-16 DIAGNOSIS — K219 Gastro-esophageal reflux disease without esophagitis: Secondary | ICD-10-CM | POA: Diagnosis not present

## 2022-07-16 MED ORDER — LINAGLIPTIN 5 MG PO TABS: 5.0000 mg | ORAL_TABLET | Freq: Every day | ORAL | 0 refills | Status: DC

## 2022-07-16 MED ORDER — FUROSEMIDE 80 MG PO TABS: 80.0000 mg | ORAL_TABLET | Freq: Every day | ORAL | 0 refills | Status: DC

## 2022-07-16 MED ORDER — NITROGLYCERIN 0.4 MG SL SUBL: 0.4000 mg | SUBLINGUAL_TABLET | SUBLINGUAL | 0 refills | Status: DC | PRN

## 2022-07-16 MED ORDER — PANTOPRAZOLE SODIUM 40 MG PO TBEC: 40.0000 mg | DELAYED_RELEASE_TABLET | Freq: Every day | ORAL | 0 refills | Status: DC

## 2022-07-16 MED ORDER — TRAMADOL HCL 50 MG PO TABS: 50.0000 mg | ORAL_TABLET | Freq: Four times a day (QID) | ORAL | 2 refills | Status: DC | PRN

## 2022-07-16 NOTE — Patient Instructions (Addendum)
Hold carvedilol due to bradycardia. Refer to cardiology. Order Holter monitor

## 2022-07-16 NOTE — Progress Notes (Unsigned)
Enrolled for [employer] to mail a ZIO XT long term holter monitor to the patients address on file.   DOD to read. 

## 2022-07-17 LAB — HEMOGLOBIN A1C
Est. average glucose Bld gHb Est-mCnc: 166 mg/dL
Hgb A1c MFr Bld: 7.4 % — ABNORMAL HIGH (ref 4.8–5.6)

## 2022-07-17 LAB — COMPREHENSIVE METABOLIC PANEL
ALT: 12 IU/L (ref 0–32)
AST: 19 IU/L (ref 0–40)
Albumin/Globulin Ratio: 1.8 (ref 1.2–2.2)
Albumin: 4.5 g/dL (ref 3.7–4.7)
Alkaline Phosphatase: 135 IU/L — ABNORMAL HIGH (ref 44–121)
BUN/Creatinine Ratio: 20 (ref 12–28)
BUN: 33 mg/dL — ABNORMAL HIGH (ref 8–27)
Bilirubin Total: 0.5 mg/dL (ref 0.0–1.2)
CO2: 23 mmol/L (ref 20–29)
Calcium: 9.5 mg/dL (ref 8.7–10.3)
Chloride: 101 mmol/L (ref 96–106)
Creatinine, Ser: 1.69 mg/dL — ABNORMAL HIGH (ref 0.57–1.00)
Globulin, Total: 2.5 g/dL (ref 1.5–4.5)
Glucose: 98 mg/dL (ref 70–99)
Potassium: 4.9 mmol/L (ref 3.5–5.2)
Sodium: 143 mmol/L (ref 134–144)
Total Protein: 7 g/dL (ref 6.0–8.5)
eGFR: 30 mL/min/{1.73_m2} — ABNORMAL LOW (ref >59)

## 2022-07-17 LAB — CBC WITH DIFFERENTIAL/PLATELET
Basophils Absolute: 0.1 10*3/uL (ref 0.0–0.2)
Basos: 1 %
EOS (ABSOLUTE): 0.1 10*3/uL (ref 0.0–0.4)
Eos: 1 %
Hematocrit: 39.7 % (ref 34.0–46.6)
Hemoglobin: 13 g/dL (ref 11.1–15.9)
Immature Grans (Abs): 0 10*3/uL (ref 0.0–0.1)
Immature Granulocytes: 0 %
Lymphocytes Absolute: 1.9 10*3/uL (ref 0.7–3.1)
Lymphs: 22 %
MCH: 31 pg (ref 26.6–33.0)
MCHC: 32.7 g/dL (ref 31.5–35.7)
MCV: 95 fL (ref 79–97)
Monocytes Absolute: 0.6 10*3/uL (ref 0.1–0.9)
Monocytes: 7 %
Neutrophils Absolute: 5.9 10*3/uL (ref 1.4–7.0)
Neutrophils: 69 %
Platelets: 233 10*3/uL (ref 150–450)
RBC: 4.19 x10E6/uL (ref 3.77–5.28)
RDW: 13.3 % (ref 11.7–15.4)
WBC: 8.5 10*3/uL (ref 3.4–10.8)

## 2022-07-17 LAB — MICROALBUMIN / CREATININE URINE RATIO
Creatinine, Urine: 117.4 mg/dL
Microalb/Creat Ratio: 48 mg/g creat — ABNORMAL HIGH (ref 0–29)
Microalbumin, Urine: 56.1 ug/mL

## 2022-07-17 LAB — LIPID PANEL
Chol/HDL Ratio: 4 ratio (ref 0.0–4.4)
Cholesterol, Total: 188 mg/dL (ref 100–199)
HDL: 47 mg/dL (ref >39)
LDL Chol Calc (NIH): 113 mg/dL — ABNORMAL HIGH (ref 0–99)
Triglycerides: 157 mg/dL — ABNORMAL HIGH (ref 0–149)
VLDL Cholesterol Cal: 28 mg/dL (ref 5–40)

## 2022-07-17 LAB — CARDIOVASCULAR RISK ASSESSMENT

## 2022-07-18 ENCOUNTER — Other Ambulatory Visit: Payer: Self-pay | Admitting: Family Medicine

## 2022-07-18 ENCOUNTER — Encounter: Payer: Self-pay | Admitting: Family Medicine

## 2022-07-18 DIAGNOSIS — R001 Bradycardia, unspecified: Secondary | ICD-10-CM | POA: Insufficient documentation

## 2022-07-18 DIAGNOSIS — E782 Mixed hyperlipidemia: Secondary | ICD-10-CM

## 2022-07-18 DIAGNOSIS — I2510 Atherosclerotic heart disease of native coronary artery without angina pectoris: Secondary | ICD-10-CM

## 2022-07-18 MED ORDER — EMPAGLIFLOZIN 25 MG PO TABS: 25.0000 mg | ORAL_TABLET | Freq: Every day | ORAL | 0 refills | Status: DC

## 2022-07-18 MED ORDER — REPATHA 140 MG/ML ~~LOC~~ SOSY: 140.0000 mg | PREFILLED_SYRINGE | SUBCUTANEOUS | 0 refills | Status: DC

## 2022-07-18 NOTE — Assessment & Plan Note (Signed)
Statin intolerant.  Start on repatha.  Labs ordered.

## 2022-07-18 NOTE — Assessment & Plan Note (Signed)
The current medical regimen is effective;  continue present plan and medications. Continue pantoprazole 40 mg  daily and pepcid 10 mg 2 times daily as needed.

## 2022-07-18 NOTE — Assessment & Plan Note (Signed)
Previously well controlled Continue Synthroid at current dose  Recheck TSH and adjust Synthroid as indicated   

## 2022-07-18 NOTE — Assessment & Plan Note (Signed)
>>  ASSESSMENT AND PLAN FOR HYPERTENSIVE HEART AND RENAL DISEASE WITH CHF ([facility]) WRITTEN ON 07/18/2022 10:05 PM BY Valin Massie, MD  Well controlled.  No changes to medicines.  Continue to work on eating a healthy diet and exercise.  Labs drawn today.

## 2022-07-18 NOTE — Assessment & Plan Note (Signed)
Ordered holtor monitor.  Refer to cardiology.

## 2022-07-18 NOTE — Assessment & Plan Note (Signed)
The current medical regimen is effective;  continue present plan and medications. Referring back to cardiology to establish with a new cardiologist as Dr. Tamala Julian retired.

## 2022-07-18 NOTE — Assessment & Plan Note (Addendum)
Control: not at goal Recommend: CGM Recommend check feet daily. Recommend annual eye exams. Medicines: Increase jardiance 25 mg daily. Continue Novolin 70/30 to 25 U before breakfast and tradjenta 5 mg daily. Continue to work on eating a healthy diet and exercise.  Labs drawn today.

## 2022-07-18 NOTE — Assessment & Plan Note (Signed)
Hold carvedilol due to bradycardia. Refer to cardiology. Order Holter monitor

## 2022-07-18 NOTE — Assessment & Plan Note (Signed)
Well controlled.  ?No changes to medicines.  ?Continue to work on eating a healthy diet and exercise.  ?Labs drawn today.  ?

## 2022-07-19 DIAGNOSIS — E1122 Type 2 diabetes mellitus with diabetic chronic kidney disease: Secondary | ICD-10-CM | POA: Diagnosis not present

## 2022-07-26 DIAGNOSIS — R42 Dizziness and giddiness: Secondary | ICD-10-CM

## 2022-08-01 ENCOUNTER — Ambulatory Visit: Payer: Medicare HMO | Admitting: Physician Assistant

## 2022-08-02 DIAGNOSIS — H9190 Unspecified hearing loss, unspecified ear: Secondary | ICD-10-CM | POA: Insufficient documentation

## 2022-08-02 DIAGNOSIS — R9431 Abnormal electrocardiogram [ECG] [EKG]: Secondary | ICD-10-CM | POA: Insufficient documentation

## 2022-08-02 DIAGNOSIS — Z9109 Other allergy status, other than to drugs and biological substances: Secondary | ICD-10-CM | POA: Insufficient documentation

## 2022-08-02 DIAGNOSIS — G459 Transient cerebral ischemic attack, unspecified: Secondary | ICD-10-CM | POA: Insufficient documentation

## 2022-08-02 DIAGNOSIS — I6529 Occlusion and stenosis of unspecified carotid artery: Secondary | ICD-10-CM | POA: Insufficient documentation

## 2022-08-02 DIAGNOSIS — I214 Non-ST elevation (NSTEMI) myocardial infarction: Secondary | ICD-10-CM | POA: Insufficient documentation

## 2022-08-02 DIAGNOSIS — I447 Left bundle-branch block, unspecified: Secondary | ICD-10-CM | POA: Insufficient documentation

## 2022-08-02 DIAGNOSIS — J45909 Unspecified asthma, uncomplicated: Secondary | ICD-10-CM | POA: Insufficient documentation

## 2022-08-02 DIAGNOSIS — Z91041 Radiographic dye allergy status: Secondary | ICD-10-CM | POA: Insufficient documentation

## 2022-08-02 DIAGNOSIS — D649 Anemia, unspecified: Secondary | ICD-10-CM | POA: Insufficient documentation

## 2022-08-02 NOTE — Progress Notes (Signed)
Cardiology Office Note:    Date:  08/03/2022   ID:  Carol [name], DOB 1936/01/14, MRN [id number]  PCP:  Rochel Brome, MD  Cardiologist:  Shirlee More, MD    Referring MD: Rochel Brome, MD    ASSESSMENT:    1. CAD in native artery   2. Chronic combined systolic and diastolic heart failure ([place])   3. LBBB (left bundle branch block)   4. Stage 3b chronic kidney disease ([place])   5. Pure hypercholesterolemia   6. Bradycardia    PLAN:    In order of problems listed above:  Overall she has done well with CAD and is not having anginal discomfort continue treatment including clopidogrel lipid-lowering starting Repatha along with Zetia she is statin intolerant reduce her beta-blocker to once daily 50% and oral mononitrate. Heart failure is stable she has no fluid overload continue her current loop diuretic ACE inhibitor beta-blocker and combined hydralazine oral nitrate.  EF is normalized We will see if we can access that EKG she had done in her PCP office recently Stable CKD I asked her to give her Repatha a chance and I will plan to do a lipid profile in 6 weeks and I suspect her LDL will be at target. Await the results of her event monitor she is still wearing   Next appointment: 6 months   Medication Adjustments/Labs and Tests Ordered: Current medicines are reviewed at length with the patient today.  Concerns regarding medicines are outlined above.  Orders Placed This Encounter  Procedures   Lipid Profile   Meds ordered this encounter  Medications   carvedilol (COREG) 6.25 MG tablet    Sig: Take 1 tablet (6.25 mg total) by mouth daily.    Dispense:  90 tablet    Refill:  3    No chief complaint on file.   History of Present Illness:    Carol [name] is a 86 y.o. female with a hx of coronary artery disease with CABG October 2016 chronic combined heart failure with EF improving from 25 to 50% left bundle branch block stage III CKD hyperlipidemia diabetes and asthma  last seen Dr. Daneen Schick service 12/22/2021.  She had an echocardiogram after that visit 01/16/2022: Left ventricle normal in size wall thickness EF 60 to 65% with grade 1 diastolic dysfunction right ventricle normal size and function aortic valve was mildly calcified without stenosis or regurgitation.  With concern of bradycardia she had an event monitor applied 07/16/2022 and carvedilol was discontinued.  Laboratory studies showed a worsening GFR at 30 cc her creatinine 1.69 normal potassium 4.9 lipid panel showed LDL above target at 113 CBC with a normal hemoglobin of 13.0  Compliance with diet, lifestyle and medications: Yes  There are multiple issues, first that she wants by my staff to do her first Repatha injection and I asked her to finish her first prescription before abandoning it she is somewhat hesitant to use it and I will plan on getting a lipid profile in 6 weeks Second problem blood pressure runs consistently 120 130/70 higher than my office She is wearing an event monitor and then tells me she has episodes of near loss of consciousness she has not had 1 since wearing the monitor She did not adjust her beta-blocker I told her to reduce it to once daily and I think we should wait to see the results of her event monitor. She is not having angina edema shortness of breath. She had an EKG in  her 34 office which I cannot review in epic I will have my office call to see if we can get a copy on 07/16/2022 Past Medical History:  Diagnosis Date   Anemia    a. Noted on labs 02/2014 possibly procedurally related.   Asthma    CAD S/P percutaneous coronary angioplasty 01/22/2014   A. (12/2013): POBA - OM2 99% (very tortuous segment) - reduced ~ 50% & TIMI 3 flow, 80-90% stenosis in mid PDA, Irregularities <50% in mRCA, pLAD and prox LCx; b. 02/17/14: Moderate sized fixed defect in Lateral wall --> cath with OM2 restenosis & otherwise stable --> Xience Alpine DES x 2 (2.25 mm x 12 & 8  mm). c. NSTEMI 02/2014 s/p PTCA/balloon angioplasty only to small caliber PDA.   Carotid stenosis    Carotid US 11/17: L 1-39; FU prn   Chronic systolic heart failure ([place])    a. ICM b. ECHO (01/2014): EF 25-30%, grade I DD, trivial MR. c. EF by Myoview 02/17/14 = ~53%. d. EF by cath 02/24/14 - improved to 50-55%.   CKD (chronic kidney disease), stage III (HCC)    Contrast media allergy    Environmental allergies    GERD (gastroesophageal reflux disease)    Gout, unspecified    HOH (hard of hearing)    Hyperlipidemia    a. H/o intolerance to Zocor, Lipitor. Had muscle aches on higher dose Crestor.   Hypertension    Hypothyroidism    LBBB (left bundle branch block)    a. Transient during 02/2014 admission.   NSTEMI (non-ST elevated myocardial infarction) ([place]) 2014; 12/2013, 01/2014   QT prolongation    a. Noted on EKG 02/2014.   Sinus bradycardia    a. HR 40s-50s during 02/2014 admission, limiting BB dose.   TIA (transient ischemic attack) 1990's   a. TIA vs stroke 1990's "mild" - sounds like a TIA as she was told she had stroke symptoms but negative scans. Denies residual effects.   Type II diabetes mellitus [facility])     Past Surgical History:  Procedure Laterality Date   ABDOMINAL HYSTERECTOMY     complete   AMPUTATION TOE Right 02/25/2021   hammer toe.   [place] CATHETERIZATION  02/24/2014   Procedure: CORONARY BALLOON ANGIOPLASTY;  Surgeon: Burnell Blanks, MD;  Location: [facility] CATH LAB;  Service: Cardiovascular;;   CARDIAC CATHETERIZATION N/A 04/20/2015   Procedure: Left Heart Cath and Coronary Angiography;  Surgeon: Belva Crome, MD;  Location: [facility] CV LAB;  Service: Cardiovascular;  Laterality: N/A;   CATARACT EXTRACTION W/ INTRAOCULAR LENS  IMPLANT, BILATERAL Bilateral 2013   CESAREAN SECTION  1959; [place]  12/2013   POBA - OM2   CORONARY ARTERY BYPASS GRAFT N/A 04/25/2015   Procedure: CORONARY ARTERY  BYPASS GRAFTING (CABG) x four,  using left internal mammary artery and right leg greater saphenous vein harvested endoscopically;  Surgeon: Gaye Pollack, MD;  Location: [place];  Service: Open Heart Surgery;  Laterality: N/A;   JOINT REPLACEMENT Right 10 years ago    right knee (TKR)   LEFT HEART CATHETERIZATION WITH CORONARY ANGIOGRAM N/A 01/22/2014   Procedure: LEFT HEART CATHETERIZATION WITH CORONARY ANGIOGRAM;  Surgeon: Sinclair Grooms, MD;  Location: [facility] CATH LAB;  Service: Cardiovascular;  Laterality: N/A;   LEFT HEART CATHETERIZATION WITH CORONARY ANGIOGRAM N/A 02/17/2014   Procedure: LEFT HEART CATHETERIZATION WITH CORONARY ANGIOGRAM;  Surgeon: Burnell Blanks,  MD;  Location: [facility] CATH LAB;  Service: Cardiovascular;  Laterality: N/A;   LEFT HEART CATHETERIZATION WITH CORONARY ANGIOGRAM N/A 02/24/2014   Procedure: LEFT HEART CATHETERIZATION WITH CORONARY ANGIOGRAM;  Surgeon: Burnell Blanks, MD;  Location: [facility] CATH LAB;  Service: Cardiovascular;  Laterality: N/A;   LUMBAR LAMINECTOMY/DECOMPRESSION MICRODISCECTOMY Left 12/22/2014   Procedure: CENTRAL DECOMPRESSIVE LUMBAR, AND LAMINECTOMY L5-S1,  S1-S2 FOR SPINAL STENOSIS FORAMINOTOMY L5, S1, S2 ROOTS ON LEFT FOR FORAMINAL STENOSIS, AND DECOMPRESSION L5-S1, S1-S2 ACCORDING TO LABELING OF [place] X-RAYS;  Surgeon: Latanya Maudlin, MD;  Location: [facility]S;  Service: Orthopedics;  Laterality: Left;   PERCUTANEOUS CORONARY STENT INTERVENTION (PCI-S)  02/17/2014   For restenosis of OM2 - PCI with Xience Apline DES 2.25 mm x 12 mm & 2.25 mm x 8 mm overlapping   REDUCTION MAMMAPLASTY Bilateral 1990's   SHOULDER OPEN ROTATOR CUFF REPAIR Bilateral 1980's - 1990's   TEE WITHOUT CARDIOVERSION N/A 04/25/2015   Procedure: TRANSESOPHAGEAL ECHOCARDIOGRAM (TEE);  Surgeon: Gaye Pollack, MD;  Location: [place];  Service: Open Heart Surgery;  Laterality: N/A;   TOTAL KNEE ARTHROPLASTY Right 2009   TOTAL KNEE ARTHROPLASTY Left 04/21/2018    TKR;  Surgeon: Vickey Huger, MD;  Location: [facility] OR[facility];  Service: Orthopedics;  Laterality: Left;  Adductor Block    Current Medications: Current Meds  Medication Sig   albuterol (VENTOLIN HFA) 108 (90 Base) MCG/ACT inhaler Inhale 2 puffs into the lungs every 6 (six) hours as needed for wheezing or shortness of breath.   Alcohol Swabs (ALCOHOL WIPES) 70 % PADS USE ONE PAD TO CLEAN AREA TO CHECK BLOOD SUGAR AS  DIRECTED   allopurinol (ZYLOPRIM) 300 MG tablet Take 300 mg by mouth every morning.    Biotin 5000 MCG CAPS Take 5,000 mcg by mouth every morning.   Blood Glucose Monitoring Suppl (TRUE METRIX METER) DEVI Use to test blood sugars once daily. ICD: E11.65   calcium carbonate (OSCAL) 1500 (600 Ca) MG TABS tablet Take 600 mg by mouth 2 (two) times daily with a meal.   carvedilol (COREG) 6.25 MG tablet Take 1 tablet (6.25 mg total) by mouth daily.   clopidogrel (PLAVIX) 75 MG tablet TAKE 1 TABLET EVERY DAY (NEED TO SCHEDULE A FOLLOW UP VISIT)   colchicine 0.6 MG tablet Take 1 tablet (0.6 mg total) by mouth 2 (two) times daily as needed for up to 7 days (gout flare up.).   Continuous Blood Gluc Receiver (FREESTYLE LIBRE READER) DEVI 1 each by Does not apply route as directed.   empagliflozin (JARDIANCE) 25 MG TABS tablet Take 1 tablet (25 mg total) by mouth daily before breakfast.   Evolocumab (REPATHA) 140 MG/ML SOSY Inject 140 mg into the skin every 14 (fourteen) days.   ezetimibe (ZETIA) 10 MG tablet Take 10 mg by mouth daily.   famotidine (PEPCID) 10 MG tablet Take 10 mg by mouth 2 (two) times daily as needed for heartburn or indigestion.    fluticasone (FLONASE) 50 MCG/ACT nasal spray USE 1 SPRAY NASALLY EVERY DAY   furosemide (LASIX) 80 MG tablet Take 1 tablet (80 mg total) by mouth daily.   gabapentin (NEURONTIN) 300 MG capsule Take 300 mg by mouth 2 (two) times daily.   glucose blood (TRUE METRIX BLOOD GLUCOSE TEST) test strip Use as instructed   hydrALAZINE (APRESOLINE) 25 MG tablet Take  25 mg by mouth 2 (two) times daily.   insulin NPH-regular Human (NOVOLIN 70/30) (70-30) 100 UNIT/ML injection Inject 25-32 Units into the  skin daily. Reports 20 units in morning and 10-12 at night if needed.   isosorbide mononitrate (IMDUR) 60 MG 24 hr tablet TAKE 1 TABLET EVERY DAY   levothyroxine (SYNTHROID, LEVOTHROID) 50 MCG tablet Take 50 mcg by mouth every morning.    linagliptin (TRADJENTA) 5 MG TABS tablet Take 1 tablet (5 mg total) by mouth daily.   lisinopril (ZESTRIL) 5 MG tablet Take 5 mg by mouth daily.   methocarbamol (ROBAXIN) 500 MG tablet Take 1-2 tablets (500-1,000 mg total) by mouth every 6 (six) hours as needed for muscle spasms.   nitroGLYCERIN (NITROSTAT) 0.4 MG SL tablet Place 0.4 mg under the tongue every 5 (five) minutes x 3 doses as needed for chest pain.   nitroGLYCERIN (NITROSTAT) 0.4 MG SL tablet Place 1 tablet (0.4 mg total) under the tongue every 5 (five) minutes as needed for chest pain (x 3 times. Call EMS if you need a second.).   pantoprazole (PROTONIX) 40 MG tablet Take 1 tablet (40 mg total) by mouth daily.   potassium chloride (KLOR-CON) 10 MEQ tablet Take 10 mEq by mouth daily.   traMADol (ULTRAM) 50 MG tablet Take 1 tablet (50 mg total) by mouth every 6 (six) hours as needed.   TRUEplus Lancets 33G MISC Use to test blood sugar once daily. ICD: E11.65   [DISCONTINUED] carvedilol (COREG) 6.25 MG tablet Take 6.25 mg by mouth 2 (two) times daily with a meal.     Allergies:   Iodinated contrast media, Atorvastatin, Corticosteroids, Hydrocodone, Other, Oxycodone, Simvastatin, and Cortizone-10 [hydrocortisone]   Social History   Socioeconomic History   Marital status: Widowed    Spouse name: Not on file   Number of children: 2   Years of education: Not on file   Highest education level: Not on file  Occupational History   Occupation: Retired  Tobacco Use   Smoking status: Never   Smokeless tobacco: Never  Vaping Use   Vaping Use: Never used  Substance  and Sexual Activity   Alcohol use: No   Drug use: No   Sexual activity: Never  Other Topics Concern   Not on file  Social History Narrative   Lives in [place] by herself.    Social Determinants of Health   Financial Resource Strain: Low Risk  (10/06/2021)   Overall Financial Resource Strain (CARDIA)    Difficulty of Paying Living Expenses: Not hard at all  Food Insecurity: No Food Insecurity (10/06/2021)   Hunger Vital Sign    Worried About Running Out of Food in the Last Year: Never true    Ran Out of Food in the Last Year: Never true  Transportation Needs: No Transportation Needs (10/06/2021)   PRAPARE - Hydrologist (Medical): No    Lack of Transportation (Non-Medical): No  Physical Activity: Sufficiently Active (10/06/2021)   Exercise Vital Sign    Days of Exercise per Week: 6 days    Minutes of Exercise per Session: 40 min  Stress: No Stress Concern Present (10/06/2021)   [place]    Feeling of Stress : Not at all  Social Connections: Not on file     Family History: The patient's family history includes Cancer in her sister; Diabetes in her brother and mother; Emphysema in her father; Heart attack (age of onset: 58) in her mother; Heart disease in her brother and father; Hypertension in her mother. There is no history of Stroke. ROS:  Please see the history of present illness.    All other systems reviewed and are negative.  EKGs/Labs/Other Studies Reviewed:    The following studies were reviewed today:   Recent Labs: 11/09/2021: Magnesium 2.4 04/16/2022: TSH 2.580 07/16/2022: ALT 12; BUN 33; Creatinine, Ser 1.69; Hemoglobin 13.0; Platelets 233; Potassium 4.9; Sodium 143  Recent Lipid Panel    Component Value Date/Time   CHOL 188 07/16/2022 1134   TRIG 157 (H) 07/16/2022 1134   HDL 47 07/16/2022 1134   CHOLHDL 4.0 07/16/2022 1134   CHOLHDL 4 07/26/2014 1115   VLDL  39.0 07/26/2014 1115   LDLCALC 113 (H) 07/16/2022 1134    Physical Exam:    VS:  BP (!) 158/60 (BP Location: Right Arm, Patient Position: Sitting)   Pulse 68   Ht 5' 7"$  (1.702 m)   Wt 195 lb (88.5 kg)   SpO2 97%   BMI 30.54 kg/m     Wt Readings from Last 3 Encounters:  08/03/22 195 lb (88.5 kg)  07/16/22 192 lb (87.1 kg)  04/20/22 196 lb (88.9 kg)     GEN:  Well nourished, well developed in no acute distress HEENT: Normal NECK: No JVD; No carotid bruits LYMPHATICS: No lymphadenopathy CARDIAC: RRR, no murmurs, rubs, gallops RESPIRATORY:  Clear to auscultation without rales, wheezing or rhonchi  ABDOMEN: Soft, non-tender, non-distended MUSCULOSKELETAL:  No edema; No deformity  SKIN: Warm and dry NEUROLOGIC:  Alert and oriented x 3 PSYCHIATRIC:  Normal affect   Seen with Jerl Santos CMA chaperone   Signed, Shirlee More, MD  08/03/2022 2:29 PM    [place]

## 2022-08-03 ENCOUNTER — Encounter: Payer: Self-pay | Admitting: Cardiology

## 2022-08-03 ENCOUNTER — Ambulatory Visit: Payer: Medicare HMO | Attending: Cardiology | Admitting: Cardiology

## 2022-08-03 VITALS — BP 158/60 | HR 68 | Ht 67.0 in | Wt 195.0 lb

## 2022-08-03 DIAGNOSIS — E7800 Pure hypercholesterolemia, unspecified: Secondary | ICD-10-CM | POA: Diagnosis not present

## 2022-08-03 DIAGNOSIS — N1832 Chronic kidney disease, stage 3b: Secondary | ICD-10-CM | POA: Diagnosis not present

## 2022-08-03 DIAGNOSIS — I5042 Chronic combined systolic (congestive) and diastolic (congestive) heart failure: Secondary | ICD-10-CM

## 2022-08-03 DIAGNOSIS — I447 Left bundle-branch block, unspecified: Secondary | ICD-10-CM

## 2022-08-03 DIAGNOSIS — I2510 Atherosclerotic heart disease of native coronary artery without angina pectoris: Secondary | ICD-10-CM

## 2022-08-03 DIAGNOSIS — R001 Bradycardia, unspecified: Secondary | ICD-10-CM

## 2022-08-03 MED ORDER — CARVEDILOL 6.25 MG PO TABS: 6.2500 mg | ORAL_TABLET | Freq: Every day | ORAL | 3 refills | Status: DC

## 2022-08-03 NOTE — Patient Instructions (Signed)
Medication Instructions:  Your physician has recommended you make the following change in your medication:   START: Coreg 6.25 mg daily  *If you need a refill on your cardiac medications before your next appointment, please call your pharmacy*   Lab Work: Your physician recommends that you return for lab work in:   Labs in 6 weeks: Lipids  If you have labs (blood work) drawn today and your tests are completely normal, you will receive your results only by: [place] (if you have [place]) OR A paper copy in the mail If you have any lab test that is abnormal or we need to change your treatment, we will call you to review the results.   Testing/Procedures: None   Follow-Up: At [facility], you and your health needs are our priority.  As part of our continuing mission to provide you with exceptional heart care, we have created designated Provider Care Teams.  These Care Teams include your primary Cardiologist (physician) and Advanced Practice Providers (APPs -  Physician Assistants and Nurse Practitioners) who all work together to provide you with the care you need, when you need it.  We recommend signing up for the patient portal called "MyChart".  Sign up information is provided on this After Visit Summary.  MyChart is used to connect with patients for Virtual Visits (Telemedicine).  Patients are able to view lab/test results, encounter notes, upcoming appointments, etc.  Non-urgent messages can be sent to your provider as well.   To learn more about what you can do with MyChart, go to [email or web address].    Your next appointment:   6 month(s)  Provider:   Shirlee More, MD    Other Instructions None  This visit was accompanied by Jerl Santos.

## 2022-08-08 ENCOUNTER — Telehealth: Payer: Self-pay | Admitting: Physician Assistant

## 2022-08-08 ENCOUNTER — Other Ambulatory Visit: Payer: Self-pay | Admitting: Family Medicine

## 2022-08-08 NOTE — Telephone Encounter (Signed)
STAT if patient feels like he/she is going to faint   Are you dizzy now? No   Do you feel faint or have you passed out? Feels faint when she stands up   Do you have any other symptoms? Pain in between shoulder, down neck, and back  Have you checked your HR and BP (record if available)? Has been ranging high past two weeks

## 2022-08-08 NOTE — Telephone Encounter (Signed)
Patient complains of feeling faint but denies passing out. She states this has been going on for several weeks. She now has neck and back pain between shoulder blades. She checked her BP but does not remember what it was. She states it was high but reports the bottom number being about 50.  She was seen recently in [place] but would prefer to return to the [place] office and see a provider here. She also just completed wearing an event monitor but needs to send it back in.   Advised her to keep her appointment on Friday at our office and discuss her concerns at that time. Advised if she has worsening symptoms of feeling faint, loses consciousness or the pain in her neck and back worsens to go to ED for evaluation. Patient verbalized understanding and had no questions.

## 2022-08-09 ENCOUNTER — Ambulatory Visit: Payer: Medicare HMO | Admitting: Cardiology

## 2022-08-09 DIAGNOSIS — R55 Syncope and collapse: Secondary | ICD-10-CM | POA: Insufficient documentation

## 2022-08-09 NOTE — Progress Notes (Addendum)
Cardiology Office Note:    Date:  08/10/2022   ID:  [name], DOB 1936/03/19, MRN [id number]  PCP:  Rochel Brome, MD  [place] Providers Cardiologist:  None     Referring MD: Rochel Brome, MD   Patient Profile: Coronary artery disease NSTEMI S/p POBA to OM2 in 12/2013 NSTEMI S/p DES to OM2 in 01/2014 S/p POBA to PDA in 02/2014 S/p CABG in 03/2015 (L-LAD, S-OM2/OM3, S-RCA) HFimpEF (heart failure with improved ejection fraction)  Ischemic CM TTE in 01/2014: EF 25-30 TTE 01/16/2022: EF 60-65, no RWMA, GR 1 DD, normal RVSF, mild MR, trivial AI, AV sclerosis, RAP 8 Diabetes mellitus  Hypertension  Hyperlipidemia  Chronic kidney disease  Left Bundle Branch Block  Hyperthyroidism  Hx of TIA     History of Present Illness:   Carol [name] is a 87 y.o. female with the above problem list.  She was last seen by Dr. Tamala Julian in June 2023.  She had follow-up with primary care in January 2024.  There was concern for bradycardia.  Carvedilol was stopped and she was placed on an event monitor.  She saw Dr. Bettina Gavia in [facility] 08/03/2022.  She was having symptoms of near syncope.  She had not yet stopped her beta-blocker and this was reduced to once daily dosing.  Event monitor results are still pending.  She returns for further evaluation of near syncope. She is here with her neighbor. She notes issues with dizziness over the past year but it is worse over the past few weeks. She has not had syncope. She describes a spinning sensation. She did not notice any improvement while off of the Carvedilol (she tells me she stopped it for 2 days). Her Ortho VS at her PCPs office 07/16/22 were abnormal (130/68 >> 106/50). She does not wear compression hose. She has a painful toe on her R foot. She has been having substernal chest heaviness/tightness. This can come on with certain types of exertion. However, she does not always get it with exertion. She also notes post neck and bilateral shoulder pain  with mild exertion. She had this prior to her CABG in 2016. Her current symptoms are not as bad. She has not had orthopnea. She has chronic edema that is mild and unchanged.   EKG: NSR, HR 62, normal axis, low voltage, QTc 438, no acute ST-TW changes.    Subjective    Reviewed and updated this encounter:   Tobacco  Allergies  Meds  Problems  Med Hx  Surg Hx  Fam Hx     Review of Systems  Constitutional: Negative for fever.  Respiratory:  Negative for cough.   Gastrointestinal:  Negative for hematochezia.  Genitourinary:  Negative for hematuria.    Objective   Labs/Other Test Reviewed:   Recent Labs: 11/09/2021: Magnesium 2.4 04/16/2022: TSH 2.580 07/16/2022: ALT 12; BUN 33; Creatinine, Ser 1.69; Hemoglobin 13.0; Platelets 233; Potassium 4.9; Sodium 143   Recent Lipid Panel Recent Labs    07/16/22 1134  CHOL 188  TRIG 157*  HDL 47  LDLCALC 113*     Risk Assessment/Calculations/Metrics:             Physical Exam:   VS:  BP 116/62   Pulse 62   Ht 5' 7.5" (1.715 m)   Wt 192 lb 6.4 oz (87.3 kg)   SpO2 94%   BMI 29.69 kg/m    Wt Readings from Last 3 Encounters:  08/10/22 192 lb 6.4 oz (87.3 kg)  08/03/22 195 lb (88.5 kg)  07/16/22 192 lb (87.1 kg)    Constitutional:      Appearance: Healthy appearance. Not in distress.  Neck:     Vascular: JVD normal.  Pulmonary:     Breath sounds: Normal breath sounds. No wheezing. No rales.  Cardiovascular:     Normal rate. Regular rhythm. Normal S1. Normal S2.      Murmurs: There is no murmur.  Edema:    Peripheral edema absent.  Abdominal:     Palpations: Abdomen is soft.      Assessment & Plan    ASSESSMENT & PLAN:   Coronary artery disease involving native coronary artery of native heart with angina pectoris ([place]) History of prior myocardial infarction and 2015 treated with angioplasty and subsequent stenting to the OM2 as well as angioplasty to the PDA.  She ultimately underwent CABG in 2016.  She has been having  chest discomfort and posterior neck and shoulder pain with exertion.  She has been able to exert herself at other times without symptoms.  Her neck pain reminds her of her previous angina.  Her electrocardiogram does not demonstrate any acute changes.  We discussed further evaluation of her symptoms to include cardiac catheterization versus stress testing.  I favor cardiac catheterization.  However, she has chronic kidney disease stage III and would be at higher risk for contrast-induced nephropathy.  The patient prefers to undergo stress testing initially.  I reviewed her case today with Dr. Irish Lack (attending MD) who agreed that stress testing and medical therapy is the best, initial option.  She has had evidence of orthostatic hypotension recently.  Her dizziness may be related to this or multifactorial (vertigo, bradycardia).  Event monitor is pending.  Given her orthostatic hypotension, I do not think she would be able to tolerate further advances in her antianginals (carvedilol, isosorbide). Arrange Lexiscan Myoview Start ranolazine 500 mg twice daily Continue Plavix 75 mg daily, carvedilol 6.25 mg once daily, isosorbide mononitrate 60 mg daily, nitroglycerin as needed Follow-up in 4 to 6 weeks  Heart failure with improved ejection fraction (HFimpEF) (HCC) Ischemic cardiomyopathy.  EF was as low as 25-30.  Most recent echocardiogram in July 2023 with normal EF.  Volume status appears stable.  As she does have orthostatic hypotension, I have recommended that we decrease her furosemide to 40 mg daily and change potassium to as needed.  She knows to weigh herself daily.  If she gains 3 or more pounds in 1 day, she can take extra furosemide 40 mg along with K+ 10 mEq.  Continue carvedilol 6.25 mg daily, Jardiance 25 mg daily, hydralazine 25 mg twice daily, isosorbide mononitrate 60 mg daily.  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) Blood pressure is controlled.  As noted, she does have  evidence of orthostatic hypotension.  Reduce furosemide as noted.  Near syncope As noted, she has evidence of orthostatic hypotension on vital signs taken at primary care recently.  She also describes a spinning sensation.  She has noted bradycardia on prior electrocardiogram.  Event monitor is pending.  Decrease furosemide as noted.  Hyperlipidemia Recent LDL above goal.  She has recently started on Repatha.          Shared Decision Making/Informed Consent The risks [chest pain, shortness of breath, cardiac arrhythmias, dizziness, blood pressure fluctuations, myocardial infarction, stroke/transient ischemic attack, nausea, vomiting, allergic reaction, radiation exposure, metallic taste sensation and life-threatening complications (estimated to be 1 in 10,000)], benefits (risk stratification, diagnosing coronary  artery disease, treatment guidance) and alternatives of a nuclear stress test were discussed in detail with Carol [name] and she agrees to proceed.   Dispo:  Return in about 6 weeks (around 09/21/2022) for Follow up after testing, w/ Richardson Dopp, PA-C.   Signed, Richardson Dopp, PA-C  08/10/2022 12:54 PM    [address], [place], [place]  [address] Phone: [phone]; Fax: [phone]

## 2022-08-10 ENCOUNTER — Ambulatory Visit: Payer: Medicare HMO | Attending: Physician Assistant | Admitting: Physician Assistant

## 2022-08-10 ENCOUNTER — Encounter: Payer: Self-pay | Admitting: Physician Assistant

## 2022-08-10 ENCOUNTER — Telehealth (HOSPITAL_COMMUNITY): Payer: Self-pay | Admitting: *Deleted

## 2022-08-10 VITALS — BP 116/62 | HR 62 | Ht 67.5 in | Wt 192.4 lb

## 2022-08-10 DIAGNOSIS — I25119 Atherosclerotic heart disease of native coronary artery with unspecified angina pectoris: Secondary | ICD-10-CM

## 2022-08-10 DIAGNOSIS — I130 Hypertensive heart and chronic kidney disease with heart failure and stage 1 through stage 4 chronic kidney disease, or unspecified chronic kidney disease: Secondary | ICD-10-CM

## 2022-08-10 DIAGNOSIS — I5022 Chronic systolic (congestive) heart failure: Secondary | ICD-10-CM | POA: Diagnosis not present

## 2022-08-10 DIAGNOSIS — R55 Syncope and collapse: Secondary | ICD-10-CM | POA: Diagnosis not present

## 2022-08-10 DIAGNOSIS — E782 Mixed hyperlipidemia: Secondary | ICD-10-CM | POA: Diagnosis not present

## 2022-08-10 MED ORDER — RANOLAZINE ER 500 MG PO TB12: 500.0000 mg | ORAL_TABLET | Freq: Two times a day (BID) | ORAL | 6 refills | Status: DC

## 2022-08-10 MED ORDER — FUROSEMIDE 80 MG PO TABS: 40.0000 mg | ORAL_TABLET | Freq: Every day | ORAL | 0 refills | Status: DC

## 2022-08-10 NOTE — Assessment & Plan Note (Signed)
As noted, she has evidence of orthostatic hypotension on vital signs taken at primary care recently.  She also describes a spinning sensation.  She has noted bradycardia on prior electrocardiogram.  Event monitor is pending.  Decrease furosemide as noted.

## 2022-08-10 NOTE — Assessment & Plan Note (Signed)
Recent LDL above goal.  She has recently started on Repatha.

## 2022-08-10 NOTE — Assessment & Plan Note (Signed)
Ischemic cardiomyopathy.  EF was as low as 25-30.  Most recent echocardiogram in July 2023 with normal EF.  Volume status appears stable.  As she does have orthostatic hypotension, I have recommended that we decrease her furosemide to 40 mg daily and change potassium to as needed.  She knows to weigh herself daily.  If she gains 3 or more pounds in 1 day, she can take extra furosemide 40 mg along with K+ 10 mEq.  Continue carvedilol 6.25 mg daily, Jardiance 25 mg daily, hydralazine 25 mg twice daily, isosorbide mononitrate 60 mg daily.

## 2022-08-10 NOTE — Telephone Encounter (Signed)
Pt reached and given instructions for MPI study on 08/13/22.

## 2022-08-10 NOTE — Assessment & Plan Note (Signed)
Blood pressure is controlled.  As noted, she does have evidence of orthostatic hypotension.  Reduce furosemide as noted.

## 2022-08-10 NOTE — Patient Instructions (Signed)
Medication Instructions:   DECEASE Lasix one half (0.5)  tablet by mouth ( 40 mg) daily. If weight increases by 3 lbs in 24 hours or 5 lbs in one week take one (1) extra tablet (40 mg) of Lasix and Potassium.  START Ranexa one (1) tablet by mouth ( 500 mg) twice daily.   ONLY Take potassium if you take extra lasix for wt gain.   *If you need a refill on your cardiac medications before your next appointment, please call your pharmacy*   Lab Work:  None ordered.  If you have labs (blood work) drawn today and your tests are completely normal, you will receive your results only by: [place] (if you have MyChart) OR A paper copy in the mail If you have any lab test that is abnormal or we need to change your treatment, we will call you to review the results.   Testing/Procedures:  You are scheduled for a Myocardial Perfusion Imaging Study on Monday, February  at 12:45 pm.   Please arrive 15 minutes prior to your appointment time for registration and insurance purposes.   The test will take approximately 3 to 4 hours to complete; you may bring reading material. If someone comes with you to your appointment, they will need to remain in the main lobby due to limited space in the testing area.    How to prepare for your Myocardial Perfusion test:   Do not eat or drink 3 hours prior to your test, except you may have water.    Do not consume products containing caffeine (regular or decaffeinated) 12 hours prior to your test (ex: coffee, chocolate, soda, tea)   Do bring a list of your current medications with you. If not listed below, you may take your medications as normal.   Bring any held medication to your appointment, as you may be required to take it once the test is complete.   Do wear comfortable clothes (no dresses) and walking shoes. Tennis shoes are preferred. No heels or open toed shoes. Hold lasix am of test.  Do not wear perfume or lotions (deodorant is allowed).    If these instructions are not followed, you test will have to be rescheduled.   Please report to [address] for your test. If you have questions or concerns about your appointment, please call the Nuclear Lab at [phone].  If you cannot keep your appointment, please provide 24 hour notification to the Nuclear lab to avoid a possible $50 charge to your account.       Follow-Up: At [facility], you and your health needs are our priority.  As part of our continuing mission to provide you with exceptional heart care, we have created designated Provider Care Teams.  These Care Teams include your primary Cardiologist (physician) and Advanced Practice Providers (APPs -  Physician Assistants and Nurse Practitioners) who all work together to provide you with the care you need, when you need it.  We recommend signing up for the patient portal called "MyChart".  Sign up information is provided on this After Visit Summary.  MyChart is used to connect with patients for Virtual Visits (Telemedicine).  Patients are able to view lab/test results, encounter notes, upcoming appointments, etc.  Non-urgent messages can be sent to your provider as well.   To learn more about what you can do with MyChart, go to [email or web address].    Your next appointment:   6 week(s)  Provider:  Heather Pemberton,MD    Other Instructions:  Wear compression and you can cut off the toes.

## 2022-08-10 NOTE — Assessment & Plan Note (Signed)
>>  ASSESSMENT AND PLAN FOR HYPERTENSIVE HEART AND RENAL DISEASE WITH CHF (HCC) WRITTEN ON 08/10/2022 12:51 PM BY WEAVER, SCOTT T, PA-C  Blood pressure is controlled.  As noted, she does have evidence of orthostatic hypotension.  Reduce furosemide as noted.

## 2022-08-10 NOTE — Assessment & Plan Note (Addendum)
History of prior myocardial infarction and 2015 treated with angioplasty and subsequent stenting to the OM2 as well as angioplasty to the PDA.  She ultimately underwent CABG in 2016.  She has been having chest discomfort and posterior neck and shoulder pain with exertion.  She has been able to exert herself at other times without symptoms.  Her neck pain reminds her of her previous angina.  Her electrocardiogram does not demonstrate any acute changes.  We discussed further evaluation of her symptoms to include cardiac catheterization versus stress testing.  I favor cardiac catheterization.  However, she has chronic kidney disease stage III and would be at higher risk for contrast-induced nephropathy.  The patient prefers to undergo stress testing initially.  I reviewed her case today with Dr. Irish Lack (attending MD) who agreed that stress testing and medical therapy is the best, initial option.  She has had evidence of orthostatic hypotension recently.  Her dizziness may be related to this or multifactorial (vertigo, bradycardia).  Event monitor is pending.  Given her orthostatic hypotension, I do not think she would be able to tolerate further advances in her antianginals (carvedilol, isosorbide). Arrange Lexiscan Myoview Start ranolazine 500 mg twice daily Continue Plavix 75 mg daily, carvedilol 6.25 mg once daily, isosorbide mononitrate 60 mg daily, nitroglycerin as needed Follow-up in 4 to 6 weeks

## 2022-08-13 ENCOUNTER — Ambulatory Visit (HOSPITAL_COMMUNITY): Payer: Medicare HMO | Attending: Cardiology

## 2022-08-13 DIAGNOSIS — I25119 Atherosclerotic heart disease of native coronary artery with unspecified angina pectoris: Secondary | ICD-10-CM | POA: Insufficient documentation

## 2022-08-13 DIAGNOSIS — R55 Syncope and collapse: Secondary | ICD-10-CM

## 2022-08-13 MED ORDER — TECHNETIUM TC 99M TETROFOSMIN IV KIT
10.2000 | PACK | Freq: Once | INTRAVENOUS | Status: AC | PRN
  Administered 2022-08-13: 10.2 via INTRAVENOUS

## 2022-08-15 ENCOUNTER — Ambulatory Visit (HOSPITAL_COMMUNITY): Payer: Medicare HMO

## 2022-08-19 DIAGNOSIS — E1122 Type 2 diabetes mellitus with diabetic chronic kidney disease: Secondary | ICD-10-CM | POA: Diagnosis not present

## 2022-08-20 ENCOUNTER — Ambulatory Visit (HOSPITAL_COMMUNITY): Payer: Medicare HMO | Attending: Cardiology

## 2022-08-20 DIAGNOSIS — I25119 Atherosclerotic heart disease of native coronary artery with unspecified angina pectoris: Secondary | ICD-10-CM | POA: Insufficient documentation

## 2022-08-20 DIAGNOSIS — R42 Dizziness and giddiness: Secondary | ICD-10-CM | POA: Diagnosis not present

## 2022-08-20 DIAGNOSIS — R55 Syncope and collapse: Secondary | ICD-10-CM | POA: Insufficient documentation

## 2022-08-20 LAB — MYOCARDIAL PERFUSION IMAGING
LV dias vol: 84 mL (ref 46–106)
LV sys vol: 33 mL
Nuc Stress EF: 60 %
Peak HR: 75 {beats}/min
Rest HR: 59 {beats}/min
Rest Nuclear Isotope Dose: 10.2 mCi
SDS: 0
SRS: 6
SSS: 5
ST Depression (mm): 0 mm
Stress Nuclear Isotope Dose: 32.9 mCi
TID: 1.45

## 2022-08-20 MED ORDER — TECHNETIUM TC 99M TETROFOSMIN IV KIT
32.9000 | PACK | Freq: Once | INTRAVENOUS | Status: AC | PRN
  Administered 2022-08-20: 32.9 via INTRAVENOUS

## 2022-08-20 MED ORDER — REGADENOSON 0.4 MG/5ML IV SOLN
0.4000 mg | Freq: Once | INTRAVENOUS | Status: AC
  Administered 2022-08-20: 0.4 mg via INTRAVENOUS

## 2022-08-24 NOTE — Progress Notes (Signed)
Pt has been made aware of normal result and verbalized understanding.  jw

## 2022-08-30 DIAGNOSIS — E113592 Type 2 diabetes mellitus with proliferative diabetic retinopathy without macular edema, left eye: Secondary | ICD-10-CM | POA: Diagnosis not present

## 2022-09-05 NOTE — Progress Notes (Signed)
Cardiology Office Note:    Date:  09/17/2022   ID:  Carol [name], DOB Jun 03, 1936, MRN [id number]  PCP:  Rochel Brome, MD   [place] Providers Cardiologist:  None   Referring MD: Rochel Brome, MD    History of Present Illness:    Carol [name] is a 87 y.o. female with a hx of CAD with prior NSTEMI s/p POBA to OM2 in 12/2013, DES to [place] 01/2014, POBA to PDA in 02/2014, CABG in 03/2015 with LIMA-LAD, SVG-OM2/OM3, SVG-RCA, chronic HFimpEF, DMII, HTN, HLD, LBBB, and prior TIA who was followed by Dr. Tamala Julian who now returns to clinic for follow-up.  Was last seen in clinic by Richardson Dopp, [place] on 07/2022 where she was having dizziness and chest pain with exertion. Myoview 07/2022 with lateral infarct but no ischemia. EF 60%. Was recommended for continued medical therapy. Cardiac monitor with no arrhythmias or significant ectopy. Patient triggered events correlated with NSR.  Today, the patient feels better today. Continues to have occasional blackout spells, but they are improved. Specifically, she feels dizzy and loses vision for a few seconds and then it resolves. Episodes always occur with standing. Also struggling with significant insomnia. No chest pain, LE edema, orthopnea, PND. Has chronic dyspnea on exertion which she attributes to her lung disease. No bleeding issues. Tolerating medications as prescribed.   Notably, she brought in her repatha today but it was not in an auto-injector. Dr. Fuller Canada administered the med today and sent in the pen for her to administer at home.    Past Medical History:  Diagnosis Date   Anemia    a. Noted on labs 02/2014 possibly procedurally related.   Asthma    CAD S/P percutaneous coronary angioplasty 01/22/2014   A. (12/2013): POBA - OM2 99% (very tortuous segment) - reduced ~ 50% & TIMI 3 flow, 80-90% stenosis in mid PDA, Irregularities <50% in mRCA, pLAD and prox LCx; b. 02/17/14: Moderate sized fixed defect in Lateral wall --> cath  with OM2 restenosis & otherwise stable --> Xience Alpine DES x 2 (2.25 mm x 12 & 8 mm). c. NSTEMI 02/2014 s/p PTCA/balloon angioplasty only to small caliber PDA.   Carotid stenosis    Carotid US 11/17: L 1-39; FU prn   Chronic systolic heart failure ([place])    a. ICM b. ECHO (01/2014): EF 25-30%, grade I DD, trivial MR. c. EF by Myoview 02/17/14 = ~53%. d. EF by cath 02/24/14 - improved to 50-55%.   CKD (chronic kidney disease), stage III (HCC)    Contrast media allergy    Environmental allergies    GERD (gastroesophageal reflux disease)    Gout, unspecified    HOH (hard of hearing)    Hyperlipidemia    a. H/o intolerance to Zocor, Lipitor. Had muscle aches on higher dose Crestor.   Hypertension    Hypothyroidism    LBBB (left bundle branch block)    a. Transient during 02/2014 admission.   NSTEMI (non-ST elevated myocardial infarction) ([place]) 2014; 12/2013, 01/2014   QT prolongation    a. Noted on EKG 02/2014.   Sinus bradycardia    a. HR 40s-50s during 02/2014 admission, limiting BB dose.   TIA (transient ischemic attack) 1990's   a. TIA vs stroke 1990's "mild" - sounds like a TIA as she was told she had stroke symptoms but negative scans. Denies residual effects.   Type II diabetes mellitus ([place])     Past Surgical History:  Procedure Laterality Date  ABDOMINAL HYSTERECTOMY     complete   AMPUTATION TOE Right 02/25/2021   hammer toe.   [place] CATHETERIZATION  02/24/2014   Procedure: CORONARY BALLOON ANGIOPLASTY;  Surgeon: Burnell Blanks, MD;  Location: [facility] CATH LAB;  Service: Cardiovascular;;   CARDIAC CATHETERIZATION N/A 04/20/2015   Procedure: Left Heart Cath and Coronary Angiography;  Surgeon: Belva Crome, MD;  Location: [facility] CV LAB;  Service: Cardiovascular;  Laterality: N/A;   CATARACT EXTRACTION W/ INTRAOCULAR LENS  IMPLANT, BILATERAL Bilateral 2013   CESAREAN SECTION  1959; [place]  12/2013   POBA  - OM2   CORONARY ARTERY BYPASS GRAFT N/A 04/25/2015   Procedure: CORONARY ARTERY BYPASS GRAFTING (CABG) x four,  using left internal mammary artery and right leg greater saphenous vein harvested endoscopically;  Surgeon: Gaye Pollack, MD;  Location: [facility];  Service: Open Heart Surgery;  Laterality: N/A;   JOINT REPLACEMENT Right 10 years ago    right knee (TKR)   LEFT HEART CATHETERIZATION WITH CORONARY ANGIOGRAM N/A 01/22/2014   Procedure: LEFT HEART CATHETERIZATION WITH CORONARY ANGIOGRAM;  Surgeon: Sinclair Grooms, MD;  Location: [facility] CATH LAB;  Service: Cardiovascular;  Laterality: N/A;   LEFT HEART CATHETERIZATION WITH CORONARY ANGIOGRAM N/A 02/17/2014   Procedure: LEFT HEART CATHETERIZATION WITH CORONARY ANGIOGRAM;  Surgeon: Burnell Blanks, MD;  Location: [facility] CATH LAB;  Service: Cardiovascular;  Laterality: N/A;   LEFT HEART CATHETERIZATION WITH CORONARY ANGIOGRAM N/A 02/24/2014   Procedure: LEFT HEART CATHETERIZATION WITH CORONARY ANGIOGRAM;  Surgeon: Burnell Blanks, MD;  Location: [facility] CATH LAB;  Service: Cardiovascular;  Laterality: N/A;   LUMBAR LAMINECTOMY/DECOMPRESSION MICRODISCECTOMY Left 12/22/2014   Procedure: CENTRAL DECOMPRESSIVE LUMBAR, AND LAMINECTOMY L5-S1,  S1-S2 FOR SPINAL STENOSIS FORAMINOTOMY L5, S1, S2 ROOTS ON LEFT FOR FORAMINAL STENOSIS, AND DECOMPRESSION L5-S1, S1-S2 ACCORDING TO LABELING OF [place] X-RAYS;  Surgeon: Latanya Maudlin, MD;  Location: [facility];  Service: Orthopedics;  Laterality: Left;   PERCUTANEOUS CORONARY STENT INTERVENTION (PCI-S)  02/17/2014   For restenosis of OM2 - PCI with Xience Apline DES 2.25 mm x 12 mm & 2.25 mm x 8 mm overlapping   REDUCTION MAMMAPLASTY Bilateral 1990's   SHOULDER OPEN ROTATOR CUFF REPAIR Bilateral 1980's - 1990's   TEE WITHOUT CARDIOVERSION N/A 04/25/2015   Procedure: TRANSESOPHAGEAL ECHOCARDIOGRAM (TEE);  Surgeon: Gaye Pollack, MD;  Location: [facility];  Service: Open Heart Surgery;  Laterality: N/A;    TOTAL KNEE ARTHROPLASTY Right 2009   TOTAL KNEE ARTHROPLASTY Left 04/21/2018   TKR;  Surgeon: Vickey Huger, MD;  Location: [facility];  Service: Orthopedics;  Laterality: Left;  Adductor Block    Current Medications: Current Meds  Medication Sig   albuterol (VENTOLIN HFA) 108 (90 Base) MCG/ACT inhaler Inhale 2 puffs into the lungs every 6 (six) hours as needed for wheezing or shortness of breath.   Alcohol Swabs (ALCOHOL WIPES) 70 % PADS USE ONE PAD TO CLEAN AREA TO CHECK BLOOD SUGAR AS  DIRECTED   allopurinol (ZYLOPRIM) 300 MG tablet Take 300 mg by mouth every morning.    Biotin 5000 MCG CAPS Take 5,000 mcg by mouth every morning.   Blood Glucose Monitoring Suppl (TRUE METRIX METER) DEVI Use to test blood sugars once daily. ICD: E11.65   calcium carbonate (OSCAL) 1500 (600 Ca) MG TABS tablet Take 600 mg by mouth 2 (two) times daily with a meal.   carvedilol (COREG) 6.25  MG tablet Take 1 tablet (6.25 mg total) by mouth daily.   clopidogrel (PLAVIX) 75 MG tablet TAKE 1 TABLET EVERY DAY (NEED TO SCHEDULE A FOLLOW UP VISIT)   Continuous Blood Gluc Receiver (FREESTYLE LIBRE READER) DEVI 1 each by Does not apply route as directed.   empagliflozin (JARDIANCE) 25 MG TABS tablet Take 1 tablet (25 mg total) by mouth daily before breakfast.   Evolocumab (REPATHA SURECLICK) XX123456 MG/ML SOAJ Inject 140 mg into the skin every 14 (fourteen) days.   ezetimibe (ZETIA) 10 MG tablet Take 10 mg by mouth daily.   famotidine (PEPCID) 10 MG tablet Take 10 mg by mouth 2 (two) times daily as needed for heartburn or indigestion.    fluticasone (FLONASE) 50 MCG/ACT nasal spray USE 1 SPRAY NASALLY EVERY DAY   furosemide (LASIX) 80 MG tablet Take 0.5 tablets (40 mg total) by mouth daily. Take one (1) extra tablet for wt gain 3 lbs in 24 hours or 5 lbs in one week with Potassium tablet.   gabapentin (NEURONTIN) 300 MG capsule Take 300 mg by mouth daily.   glucose blood (TRUE METRIX BLOOD GLUCOSE TEST) test strip Use as  instructed   hydrALAZINE (APRESOLINE) 25 MG tablet Take 25 mg by mouth 2 (two) times daily.   insulin NPH-regular Human (NOVOLIN 70/30) (70-30) 100 UNIT/ML injection Inject 25-32 Units into the skin daily. Reports 20 units in morning and 10-12 at night if needed.   isosorbide mononitrate (IMDUR) 60 MG 24 hr tablet TAKE 1 TABLET EVERY DAY   levothyroxine (SYNTHROID, LEVOTHROID) 50 MCG tablet Take 50 mcg by mouth every morning.    linagliptin (TRADJENTA) 5 MG TABS tablet Take 1 tablet (5 mg total) by mouth daily.   methocarbamol (ROBAXIN) 500 MG tablet Take 1-2 tablets (500-1,000 mg total) by mouth every 6 (six) hours as needed for muscle spasms.   nitroGLYCERIN (NITROSTAT) 0.4 MG SL tablet Place 1 tablet (0.4 mg total) under the tongue every 5 (five) minutes as needed for chest pain (x 3 times. Call EMS if you need a second.).   pantoprazole (PROTONIX) 40 MG tablet Take 1 tablet (40 mg total) by mouth daily.   potassium chloride (KLOR-CON) 10 MEQ tablet Take 10 mEq by mouth as needed. To take one (1) tablet if you take extra lasix for weight gain.   ranolazine (RANEXA) 500 MG 12 hr tablet Take 1 tablet (500 mg total) by mouth 2 (two) times daily.   traMADol (ULTRAM) 50 MG tablet Take 1 tablet (50 mg total) by mouth every 6 (six) hours as needed.   TRUEplus Lancets 33G MISC Use to test blood sugar once daily. ICD: E11.65   [DISCONTINUED] Evolocumab (REPATHA) 140 MG/ML SOSY Inject 140 mg into the skin every 14 (fourteen) days.     Allergies:   Iodinated contrast media, Atorvastatin, Corticosteroids, Hydrocodone, Other, Oxycodone, Simvastatin, and Cortizone-10 [hydrocortisone]   Social History   Socioeconomic History   Marital status: Widowed    Spouse name: Not on file   Number of children: 2   Years of education: Not on file   Highest education level: Not on file  Occupational History   Occupation: Retired  Tobacco Use   Smoking status: Never   Smokeless tobacco: Never  Vaping Use    Vaping Use: Never used  Substance and Sexual Activity   Alcohol use: No   Drug use: No   Sexual activity: Never  Other Topics Concern   Not on file  Social History Narrative  Lives in [place] by herself.    Social Determinants of Health   Financial Resource Strain: Low Risk  (10/06/2021)   Overall Financial Resource Strain (CARDIA)    Difficulty of Paying Living Expenses: Not hard at all  Food Insecurity: No Food Insecurity (10/06/2021)   Hunger Vital Sign    Worried About Running Out of Food in the Last Year: Never true    Ran Out of Food in the Last Year: Never true  Transportation Needs: No Transportation Needs (10/06/2021)   PRAPARE - Hydrologist (Medical): No    Lack of Transportation (Non-Medical): No  Physical Activity: Sufficiently Active (10/06/2021)   Exercise Vital Sign    Days of Exercise per Week: 6 days    Minutes of Exercise per Session: 40 min  Stress: No Stress Concern Present (10/06/2021)   [place]    Feeling of Stress : Not at all  Social Connections: Not on file     Family History: The patient's family history includes Cancer in her sister; Diabetes in her brother and mother; Emphysema in her father; Heart attack (age of onset: 15) in her mother; Heart disease in her brother and father; Hypertension in her mother. There is no history of Stroke.  ROS:   Please see the history of present illness.     All other systems reviewed and are negative.  EKGs/Labs/Other Studies Reviewed:    The following studies were reviewed today: Cardiac Monitor 07/2022: Patch Wear Time:  12 days and 21 hours    Predominant rhythm was sinus rhythm Less than 1% ventricular and supraventricular ectopy Arm/neck pain, dizziness, tingling, lightheaded associated with sinus rhythm Mobitz 1 AV block noted, all nocturnal 0% atrial fibrillation burden  Myoview 07/2022:   Findings are  consistent with infarction. The study is intermediate risk.   No ST deviation was noted.   Left ventricular function is normal. Nuclear stress EF: 60 %. The left ventricular ejection fraction is normal (55-65%). End diastolic cavity size is normal. End systolic cavity size is normal.   Prior study available for comparison from 12/25/2012. No changes compared to prior study.   Lateral infarct with preserved ejection fraction. Intermediate risk due to infarct burden.  TTE 12/2021: IMPRESSIONS     1. Left ventricular ejection fraction, by estimation, is 60 to 65%. The  left ventricle has normal function. The left ventricle has no regional  wall motion abnormalities. Left ventricular diastolic parameters are  consistent with Grade I diastolic  dysfunction (impaired relaxation).   2. Right ventricular systolic function is normal. The right ventricular  size is normal. Tricuspid regurgitation signal is inadequate for assessing  PA pressure.   3. The mitral valve is normal in structure. Mild mitral valve  regurgitation. No evidence of mitral stenosis.   4. The aortic valve is tricuspid. There is mild calcification of the  aortic valve. Aortic valve regurgitation is trivial. Aortic valve  sclerosis is present, with no evidence of aortic valve stenosis.   5. The inferior vena cava is dilated in size with >50% respiratory  variability, suggesting right atrial pressure of 8 mmHg.   Comparison(s): No prior Echocardiogram.   EKG:  EKG is not ordered today.    Recent Labs: 11/09/2021: Magnesium 2.4 04/16/2022: TSH 2.580 07/16/2022: ALT 12; BUN 33; Creatinine, Ser 1.69; Hemoglobin 13.0; Platelets 233; Potassium 4.9; Sodium 143  Recent Lipid Panel    Component Value Date/Time   CHOL  188 07/16/2022 1134   TRIG 157 (H) 07/16/2022 1134   HDL 47 07/16/2022 1134   CHOLHDL 4.0 07/16/2022 1134   CHOLHDL 4 07/26/2014 1115   VLDL 39.0 07/26/2014 1115   LDLCALC 113 (H) 07/16/2022 1134     Risk  Assessment/Calculations:                Physical Exam:    VS:  BP 132/79   Pulse 63   Ht 5' 7.5" (1.715 m)   Wt 194 lb 12.8 oz (88.4 kg)   SpO2 98%   BMI 30.06 kg/m     Wt Readings from Last 3 Encounters:  09/17/22 194 lb 12.8 oz (88.4 kg)  08/13/22 192 lb (87.1 kg)  08/10/22 192 lb 6.4 oz (87.3 kg)     GEN:  Well nourished, well developed in no acute distress HEENT: Normal NECK: No JVD; No carotid bruits CARDIAC: RRR, 2/6 systolic murmur. No rubs, gallops RESPIRATORY:  Clear to auscultation without rales, wheezing or rhonchi  ABDOMEN: Soft, non-tender, non-distended MUSCULOSKELETAL:  No edema; No deformity  SKIN: Warm and dry NEUROLOGIC:  Alert and oriented x 3 PSYCHIATRIC:  Normal affect   ASSESSMENT:    1. Coronary artery disease involving native coronary artery of native heart with angina pectoris ([place])   2. Orthostasis   3. Mixed hyperlipidemia   4. Chronic combined systolic and diastolic heart failure ([place])   5. LBBB (left bundle branch block)    PLAN:    In order of problems listed above:  #CAD s/p CABG: -Patient with extensive CAD history s/p CABG in 2016 as detailed above -Myoview 07/2022 with lateral scar but no ischemia -Recommended for continued medical management -Continue plavix 75mg  daily -Continue repatha 140mg  BID -Continue zetia 10mg  daily -Continue imdur 60mg  daily -Continue coreg 6.25mg  BID -Continue ranolazine 500mg  BID  #Chronic Systolic HF with Improved EF: -EF as low as 25-30% -TTE 12/2021 with EF 60-65%, G1DD -Currently euvolemic -Continue lasix 40mg  daily -Continue coreg 6.25mg  BID  #HTN: -Had episodes of orthostatic hypotension in PCP office with symptoms now improved -Continue coreg 6.25mg  BID -Continue imdur 60mg  daily -Continue hydralazine 25 mg BID  #HLD: -Continue repatha 140mg  BID (given today and sent in the pen today for ease of administration) -Continue zetia 10mg  daily  #Orthostasis: -Suspect dizzy  episodes with standing are related to orthostasis -Reassuring cardiac work-up as detailed above -Will continue with hydration, compression socks, slow position changes -If needed, can decrease her lasix/BP dosing  #Chronic LBBB: -Stable         Medication Adjustments/Labs and Tests Ordered: Current medicines are reviewed at length with the patient today.  Concerns regarding medicines are outlined above.  No orders of the defined types were placed in this encounter.  Meds ordered this encounter  Medications   Evolocumab (REPATHA SURECLICK) [id number] MG/ML SOAJ    Sig: Inject 140 mg into the skin every 14 (fourteen) days.    Dispense:  2 mL    Refill:  11    Patient Instructions  Medication Instructions:   Your physician recommends that you continue on your current medications as directed. Please refer to the Current Medication list given to you today.  *If you need a refill on your cardiac medications before your next appointment, please call your pharmacy*   Follow-Up: At [facility], you and your health needs are our priority.  As part of our continuing mission to provide you with exceptional heart care, we have created designated Provider Care  Teams.  These Care Teams include your primary Cardiologist (physician) and Advanced Practice Providers (APPs -  Physician Assistants and Nurse Practitioners) who all work together to provide you with the care you need, when you need it.  We recommend signing up for the patient portal called "MyChart".  Sign up information is provided on this After Visit Summary.  MyChart is used to connect with patients for Virtual Visits (Telemedicine).  Patients are able to view lab/test results, encounter notes, upcoming appointments, etc.  Non-urgent messages can be sent to your provider as well.   To learn more about what you can do with MyChart, go to [email or web address].    Your next appointment:   6 month(s)  Provider:   Dr. Johney Frame     Signed, Freada Bergeron, MD  09/17/2022 11:45 AM    [place]

## 2022-09-10 ENCOUNTER — Other Ambulatory Visit: Payer: Self-pay

## 2022-09-17 ENCOUNTER — Encounter: Payer: Self-pay | Admitting: Cardiology

## 2022-09-17 ENCOUNTER — Ambulatory Visit: Payer: Medicare HMO | Attending: Cardiology | Admitting: Cardiology

## 2022-09-17 VITALS — BP 132/79 | HR 63 | Ht 67.5 in | Wt 194.8 lb

## 2022-09-17 DIAGNOSIS — I447 Left bundle-branch block, unspecified: Secondary | ICD-10-CM

## 2022-09-17 DIAGNOSIS — I5042 Chronic combined systolic (congestive) and diastolic (congestive) heart failure: Secondary | ICD-10-CM

## 2022-09-17 DIAGNOSIS — I25119 Atherosclerotic heart disease of native coronary artery with unspecified angina pectoris: Secondary | ICD-10-CM | POA: Diagnosis not present

## 2022-09-17 DIAGNOSIS — I951 Orthostatic hypotension: Secondary | ICD-10-CM

## 2022-09-17 DIAGNOSIS — E782 Mixed hyperlipidemia: Secondary | ICD-10-CM

## 2022-09-17 MED ORDER — REPATHA SURECLICK 140 MG/ML ~~LOC~~ SOAJ: 1.0000 | SUBCUTANEOUS | 11 refills | Status: DC

## 2022-09-17 NOTE — Patient Instructions (Signed)
Medication Instructions:   Your physician recommends that you continue on your current medications as directed. Please refer to the Current Medication list given to you today.  *If you need a refill on your cardiac medications before your next appointment, please call your pharmacy*     Follow-Up: At [facility], you and your health needs are our priority.  As part of our continuing mission to provide you with exceptional heart care, we have created designated Provider Care Teams.  These Care Teams include your primary Cardiologist (physician) and Advanced Practice Providers (APPs -  Physician Assistants and Nurse Practitioners) who all work together to provide you with the care you need, when you need it.  We recommend signing up for the patient portal called "MyChart".  Sign up information is provided on this After Visit Summary.  MyChart is used to connect with patients for Virtual Visits (Telemedicine).  Patients are able to view lab/test results, encounter notes, upcoming appointments, etc.  Non-urgent messages can be sent to your provider as well.   To learn more about what you can do with MyChart, go to [email or web address].    Your next appointment:   6 month(s)  Provider:   Dr. Pemberton   

## 2022-09-19 DIAGNOSIS — E1122 Type 2 diabetes mellitus with diabetic chronic kidney disease: Secondary | ICD-10-CM | POA: Diagnosis not present

## 2022-09-27 DIAGNOSIS — E113592 Type 2 diabetes mellitus with proliferative diabetic retinopathy without macular edema, left eye: Secondary | ICD-10-CM | POA: Diagnosis not present

## 2022-10-03 ENCOUNTER — Telehealth: Payer: Self-pay

## 2022-10-03 ENCOUNTER — Other Ambulatory Visit: Payer: Self-pay | Admitting: Family Medicine

## 2022-10-03 DIAGNOSIS — I5032 Chronic diastolic (congestive) heart failure: Secondary | ICD-10-CM

## 2022-10-03 DIAGNOSIS — K219 Gastro-esophageal reflux disease without esophagitis: Secondary | ICD-10-CM

## 2022-10-03 DIAGNOSIS — I2510 Atherosclerotic heart disease of native coronary artery without angina pectoris: Secondary | ICD-10-CM

## 2022-10-03 DIAGNOSIS — E1140 Type 2 diabetes mellitus with diabetic neuropathy, unspecified: Secondary | ICD-10-CM

## 2022-10-03 NOTE — Progress Notes (Signed)
This patient is appearing on the insurance-provided list for being at risk of failing the adherence measure for cholesterol and diabetes medications this calendar year.   Medication: Jardiance (filled 1/25 for a 90 day supply), Tradjenta (filled 07/16/2022 for a 90 day supply), rosuvastatin (filled 04/19/2022 for a 90 day supply)   Spoke with patient to discuss adherence, any barriers to adherence. Patient states she has plenty of the above medications on hand.  Valeda Malm, Pharm.D. PGY-2 Ambulatory Care Pharmacy Resident

## 2022-10-17 ENCOUNTER — Other Ambulatory Visit: Payer: Self-pay | Admitting: Family Medicine

## 2022-10-17 DIAGNOSIS — E782 Mixed hyperlipidemia: Secondary | ICD-10-CM

## 2022-10-17 DIAGNOSIS — I2510 Atherosclerotic heart disease of native coronary artery without angina pectoris: Secondary | ICD-10-CM

## 2022-10-18 ENCOUNTER — Ambulatory Visit: Payer: Medicare HMO | Admitting: Family Medicine

## 2022-10-20 DIAGNOSIS — E1122 Type 2 diabetes mellitus with diabetic chronic kidney disease: Secondary | ICD-10-CM | POA: Diagnosis not present

## 2022-10-22 ENCOUNTER — Ambulatory Visit (INDEPENDENT_AMBULATORY_CARE_PROVIDER_SITE_OTHER): Payer: Medicare HMO | Admitting: Family Medicine

## 2022-10-22 ENCOUNTER — Encounter: Payer: Self-pay | Admitting: Family Medicine

## 2022-10-22 VITALS — BP 106/62 | HR 70 | Temp 96.9°F | Ht 67.5 in | Wt 197.0 lb

## 2022-10-22 DIAGNOSIS — M1A9XX0 Chronic gout, unspecified, without tophus (tophi): Secondary | ICD-10-CM

## 2022-10-22 DIAGNOSIS — E1140 Type 2 diabetes mellitus with diabetic neuropathy, unspecified: Secondary | ICD-10-CM

## 2022-10-22 DIAGNOSIS — E782 Mixed hyperlipidemia: Secondary | ICD-10-CM

## 2022-10-22 DIAGNOSIS — I130 Hypertensive heart and chronic kidney disease with heart failure and stage 1 through stage 4 chronic kidney disease, or unspecified chronic kidney disease: Secondary | ICD-10-CM | POA: Diagnosis not present

## 2022-10-22 DIAGNOSIS — I2510 Atherosclerotic heart disease of native coronary artery without angina pectoris: Secondary | ICD-10-CM

## 2022-10-22 DIAGNOSIS — E6609 Other obesity due to excess calories: Secondary | ICD-10-CM | POA: Diagnosis not present

## 2022-10-22 DIAGNOSIS — K219 Gastro-esophageal reflux disease without esophagitis: Secondary | ICD-10-CM

## 2022-10-22 DIAGNOSIS — Z794 Long term (current) use of insulin: Secondary | ICD-10-CM

## 2022-10-22 DIAGNOSIS — I509 Heart failure, unspecified: Secondary | ICD-10-CM

## 2022-10-22 DIAGNOSIS — I10 Essential (primary) hypertension: Secondary | ICD-10-CM

## 2022-10-22 DIAGNOSIS — E039 Hypothyroidism, unspecified: Secondary | ICD-10-CM | POA: Diagnosis not present

## 2022-10-22 DIAGNOSIS — Z6830 Body mass index (BMI) 30.0-30.9, adult: Secondary | ICD-10-CM

## 2022-10-22 NOTE — Progress Notes (Signed)
Subjective:  Patient ID: Carol [name], female    DOB: [birth year]  Age: 87 y.o. MRN: [id number]  Chief Complaint  Patient presents with   Hyperlipidemia   Hypertension   Diabetes    Diabetes: Diabetes: neuropathy.  Last A1C was 7.4. Medications: Novolin 70/30 to 25 U before breakfast and tradjenta 5 mg daily, Jardiance 25 mg daily. .  Glucose log: check once daily in am. 45-90, 112-200 CGM: free style libre.  Checking feet for sores.  Eats twice a day. Not eating her vegetables. Eating chicken breast, potatoes (air fry.) no fruits.    Constipation: resolved for the most part. Occasionally has to take miralax or linzess.   Neuropathy: takes tramadol 50 mg four times a day as needed severe pain. On gabapentin 300 mg twice daily. .    Hyperlipidemia: on zetia 10 mg daily, repatha every 14 days. Intolerant to statins.    GERD: on pantoprazole 40 mg  daily and pepcid 10 mg 2 times daily as needed.    Gout: ON allopurinol 300 mg qd.    Hypothyroidism: on levothyroxine 50 mcg once daily in am.     Hypertensive heart disease with angina: on imdur, plavix, repatha, carvedilol 6.25 mg twice daily, hydralazine 25 mg twice daily, and ranexa 500 mg twice daily.      10/22/2022    2:45 PM 07/16/2022    9:59 AM 01/09/2022   10:18 AM 11/09/2021   10:40 AM 10/06/2021   11:14 AM  Depression screen PHQ 2/9  Decreased Interest 0 0 0 0 0  Down, Depressed, Hopeless 0 0 0 0 0  PHQ - 2 Score 0 0 0 0 0         10/06/2021   11:11 AM 11/09/2021   10:40 AM 01/09/2022   10:17 AM 07/16/2022    9:59 AM 10/22/2022    2:45 PM  Fall Risk  Falls in the past year? 1 0 0 1 0  Number of falls in past year - Comments fx rt arm      Was there an injury with Fall? 1 0 0 0 0  Was there an injury with Fall? - Comments broke rt arm      Fall Risk Category Calculator 2 0 0 2 0  Fall Risk Category (Retired) Moderate Low Low    (RETIRED) Patient Fall Risk Level Moderate fall risk  Low fall risk    Patient at  Risk for Falls Due to History of fall(s)  No Fall Risks History of fall(s) No Fall Risks  Fall risk Follow up Education provided;Falls prevention discussed Falls evaluation completed Falls evaluation completed Falls evaluation completed Falls evaluation completed      Review of Systems  Constitutional:  Negative for chills, fatigue and fever.  HENT:  Negative for congestion, ear pain and sore throat.   Respiratory:  Negative for cough and shortness of breath.   Cardiovascular:  Negative for chest pain.  Gastrointestinal:  Negative for abdominal pain, constipation, diarrhea, nausea and vomiting.  Genitourinary:  Negative for dysuria and urgency.  Musculoskeletal:  Positive for arthralgias. Negative for myalgias.  Skin:  Negative for rash.  Neurological:  Negative for dizziness and headaches.  Psychiatric/Behavioral:  Negative for dysphoric mood. The patient is not nervous/anxious.     Current Outpatient Medications on File Prior to Visit  Medication Sig Dispense Refill   albuterol (VENTOLIN HFA) 108 (90 Base) MCG/ACT inhaler Inhale 2 puffs into the lungs every 6 (six) hours as needed  for wheezing or shortness of breath. 24 g 1   Alcohol Swabs (ALCOHOL WIPES) 70 % PADS USE ONE PAD TO CLEAN AREA TO CHECK BLOOD SUGAR AS  DIRECTED     allopurinol (ZYLOPRIM) 300 MG tablet Take 300 mg by mouth every morning.      Biotin 5000 MCG CAPS Take 5,000 mcg by mouth every morning.     Blood Glucose Monitoring Suppl (TRUE METRIX METER) DEVI Use to test blood sugars once daily. ICD: E11.65     calcium carbonate (OSCAL) 1500 (600 Ca) MG TABS tablet Take 600 mg by mouth 2 (two) times daily with a meal.     carvedilol (COREG) 6.25 MG tablet Take 1 tablet (6.25 mg total) by mouth daily. 90 tablet 3   clopidogrel (PLAVIX) 75 MG tablet TAKE 1 TABLET EVERY DAY (NEED TO SCHEDULE A FOLLOW UP VISIT) 90 tablet 3   colchicine 0.6 MG tablet Take 1 tablet (0.6 mg total) by mouth 2 (two) times daily as needed for up to 7  days (gout flare up.). 30 tablet 1   Continuous Blood Gluc Receiver (FREESTYLE LIBRE READER) DEVI 1 each by Does not apply route as directed. 1 each 0   empagliflozin (JARDIANCE) 25 MG TABS tablet Take 1 tablet (25 mg total) by mouth daily before breakfast. 90 tablet 0   Evolocumab (REPATHA SURECLICK) 140 MG/ML SOAJ Inject 140 mg into the skin every 14 (fourteen) days. 2 mL 11   ezetimibe (ZETIA) 10 MG tablet Take 10 mg by mouth daily.     famotidine (PEPCID) 10 MG tablet Take 10 mg by mouth 2 (two) times daily as needed for heartburn or indigestion.      fluticasone (FLONASE) 50 MCG/ACT nasal spray USE 1 SPRAY NASALLY EVERY DAY     furosemide (LASIX) 80 MG tablet TAKE 1 TABLET EVERY DAY 90 tablet 3   gabapentin (NEURONTIN) 300 MG capsule Take 300 mg by mouth daily.     glucose blood (TRUE METRIX BLOOD GLUCOSE TEST) test strip Use as instructed 100 each 12   hydrALAZINE (APRESOLINE) 25 MG tablet Take 25 mg by mouth 2 (two) times daily.     insulin NPH-regular Human (NOVOLIN 70/30) (70-30) 100 UNIT/ML injection Inject 25-32 Units into the skin daily. Reports 20 units in morning and 10-12 at night if needed.     isosorbide mononitrate (IMDUR) 60 MG 24 hr tablet TAKE 1 TABLET EVERY DAY 90 tablet 3   levothyroxine (SYNTHROID, LEVOTHROID) 50 MCG tablet Take 50 mcg by mouth every morning.      methocarbamol (ROBAXIN) 500 MG tablet Take 1-2 tablets (500-1,000 mg total) by mouth every 6 (six) hours as needed for muscle spasms. 60 tablet 0   nitroGLYCERIN (NITROSTAT) 0.4 MG SL tablet DISSOLVE 1 TABLET UNDER THE TONGUE EVERY 5 MINUTES AS NEEDED FOR CHEST PAIN UP TO 3 TIMES. CALL EMS IF YOU NEED A SECOND. 25 tablet 11   pantoprazole (PROTONIX) 40 MG tablet TAKE 1 TABLET EVERY DAY 90 tablet 3   potassium chloride (KLOR-CON) 10 MEQ tablet Take 10 mEq by mouth as needed. To take one (1) tablet if you take extra lasix for weight gain.     ranolazine (RANEXA) 500 MG 12 hr tablet Take 1 tablet (500 mg total) by  mouth 2 (two) times daily. 60 tablet 6   TRADJENTA 5 MG TABS tablet TAKE 1 TABLET EVERY DAY 90 tablet 3   traMADol (ULTRAM) 50 MG tablet Take 1 tablet (50 mg total) by mouth  every 6 (six) hours as needed. 30 tablet 2   TRUEplus Lancets 33G MISC Use to test blood sugar once daily. ICD: E11.65     No current facility-administered medications on file prior to visit.   Past Medical History:  Diagnosis Date   Anemia    a. Noted on labs 02/2014 possibly procedurally related.   Asthma    CAD S/P percutaneous coronary angioplasty 01/22/2014   A. (12/2013): POBA - OM2 99% (very tortuous segment) - reduced ~ 50% & TIMI 3 flow, 80-90% stenosis in mid PDA, Irregularities <50% in mRCA, pLAD and prox LCx; b. 02/17/14: Moderate sized fixed defect in Lateral wall --> cath with OM2 restenosis & otherwise stable --> Xience Alpine DES x 2 (2.25 mm x 12 & 8 mm). c. NSTEMI 02/2014 s/p PTCA/balloon angioplasty only to small caliber PDA.   Carotid stenosis    Carotid US 11/17: L 1-39; FU prn   Chronic systolic heart failure (HCC)    a. ICM b. ECHO (01/2014): EF 25-30%, grade I DD, trivial MR. c. EF by Myoview 02/17/14 = ~53%. d. EF by cath 02/24/14 - improved to 50-55%.   CKD (chronic kidney disease), stage III (HCC)    Contrast media allergy    Environmental allergies    GERD (gastroesophageal reflux disease)    Gout, unspecified    HOH (hard of hearing)    Hyperlipidemia    a. H/o intolerance to Zocor, Lipitor. Had muscle aches on higher dose Crestor.   Hypertension    Hypothyroidism    LBBB (left bundle branch block)    a. Transient during 02/2014 admission.   NSTEMI (non-ST elevated myocardial infarction) (HCC) 2014; 12/2013, 01/2014   QT prolongation    a. Noted on EKG 02/2014.   Sinus bradycardia    a. HR 40s-50s during 02/2014 admission, limiting BB dose.   TIA (transient ischemic attack) 1990's   a. TIA vs stroke 1990's "mild" - sounds like a TIA as she was told she had stroke symptoms but negative scans.  Denies residual effects.   Type II diabetes mellitus [facility])    Past Surgical History:  Procedure Laterality Date   ABDOMINAL HYSTERECTOMY     complete   AMPUTATION TOE Right 02/25/2021   hammer toe.   APPENDECTOMY  1959   CARDIAC CATHETERIZATION  02/24/2014   Procedure: CORONARY BALLOON ANGIOPLASTY;  Surgeon: Kathleene Hazel, MD;  Location: [facility] CATH LAB;  Service: Cardiovascular;;   CARDIAC CATHETERIZATION N/A 04/20/2015   Procedure: Left Heart Cath and Coronary Angiography;  Surgeon: Lyn Records, MD;  Location: [facility] INVASIVE CV LAB;  Service: Cardiovascular;  Laterality: N/A;   CATARACT EXTRACTION W/ INTRAOCULAR LENS  IMPLANT, BILATERAL Bilateral 2013   CESAREAN SECTION  1959; 1960   CHOLECYSTECTOMY  1989   CORONARY ANGIOPLASTY  12/2013   POBA - OM2   CORONARY ARTERY BYPASS GRAFT N/A 04/25/2015   Procedure: CORONARY ARTERY BYPASS GRAFTING (CABG) x four,  using left internal mammary artery and right leg greater saphenous vein harvested endoscopically;  Surgeon: Alleen Borne, MD;  Location: [facility] OR;  Service: Open Heart Surgery;  Laterality: N/A;   JOINT REPLACEMENT Right 10 years ago    right knee (TKR)   LEFT HEART CATHETERIZATION WITH CORONARY ANGIOGRAM N/A 01/22/2014   Procedure: LEFT HEART CATHETERIZATION WITH CORONARY ANGIOGRAM;  Surgeon: Lesleigh Noe, MD;  Location: [facility] CATH LAB;  Service: Cardiovascular;  Laterality: N/A;   LEFT HEART CATHETERIZATION WITH CORONARY ANGIOGRAM N/A 02/17/2014  Procedure: LEFT HEART CATHETERIZATION WITH CORONARY ANGIOGRAM;  Surgeon: Kathleene Hazel, MD;  Location: [facility] CATH LAB;  Service: Cardiovascular;  Laterality: N/A;   LEFT HEART CATHETERIZATION WITH CORONARY ANGIOGRAM N/A 02/24/2014   Procedure: LEFT HEART CATHETERIZATION WITH CORONARY ANGIOGRAM;  Surgeon: Kathleene Hazel, MD;  Location: [facility] CATH LAB;  Service: Cardiovascular;  Laterality: N/A;   LUMBAR LAMINECTOMY/DECOMPRESSION MICRODISCECTOMY Left 12/22/2014   Procedure:  CENTRAL DECOMPRESSIVE LUMBAR, AND LAMINECTOMY L5-S1,  S1-S2 FOR SPINAL STENOSIS FORAMINOTOMY L5, S1, S2 ROOTS ON LEFT FOR FORAMINAL STENOSIS, AND DECOMPRESSION L5-S1, S1-S2 ACCORDING TO LABELING OF [facility] X-RAYS;  Surgeon: Ranee Gosselin, MD;  Location: [facility] ORS;  Service: Orthopedics;  Laterality: Left;   PERCUTANEOUS CORONARY STENT INTERVENTION (PCI-S)  02/17/2014   For restenosis of OM2 - PCI with Xience Apline DES 2.25 mm x 12 mm & 2.25 mm x 8 mm overlapping   REDUCTION MAMMAPLASTY Bilateral 1990's   SHOULDER OPEN ROTATOR CUFF REPAIR Bilateral 1980's - 1990's   TEE WITHOUT CARDIOVERSION N/A 04/25/2015   Procedure: TRANSESOPHAGEAL ECHOCARDIOGRAM (TEE);  Surgeon: Alleen Borne, MD;  Location: [facility] OR;  Service: Open Heart Surgery;  Laterality: N/A;   TOTAL KNEE ARTHROPLASTY Right 2009   TOTAL KNEE ARTHROPLASTY Left 04/21/2018   TKR;  Surgeon: Dannielle Huh, MD;  Location: [facility] ORS;  Service: Orthopedics;  Laterality: Left;  Adductor Block    Family History  Problem Relation Age of Onset   Hypertension Mother    Diabetes Mother    Heart attack Mother 63   Heart disease Father    Emphysema Father    Diabetes Brother    Heart disease Brother    Cancer Sister    Stroke Neg Hx    Social History   Socioeconomic History   Marital status: Widowed    Spouse name: Not on file   Number of children: 2   Years of education: Not on file   Highest education level: Not on file  Occupational History   Occupation: Retired  Tobacco Use   Smoking status: Never   Smokeless tobacco: Never  Vaping Use   Vaping Use: Never used  Substance and Sexual Activity   Alcohol use: No   Drug use: No   Sexual activity: Never  Other Topics Concern   Not on file  Social History Narrative   Lives in [place] by herself.    Social Determinants of Health   Financial Resource Strain: Low Risk  (10/22/2022)   Overall Financial Resource Strain (CARDIA)    Difficulty of Paying Living Expenses: Not  hard at all  Food Insecurity: No Food Insecurity (10/22/2022)   Hunger Vital Sign    Worried About Running Out of Food in the Last Year: Never true    Ran Out of Food in the Last Year: Never true  Transportation Needs: No Transportation Needs (10/22/2022)   PRAPARE - Administrator, Civil Service (Medical): No    Lack of Transportation (Non-Medical): No  Physical Activity: Sufficiently Active (10/22/2022)   Exercise Vital Sign    Days of Exercise per Week: 6 days    Minutes of Exercise per Session: 40 min  Stress: No Stress Concern Present (10/22/2022)   [employer] of Occupational Health - Occupational Stress Questionnaire    Feeling of Stress : Not at all  Social Connections: Moderately Isolated (10/22/2022)   Social Connection and Isolation Panel [NHANES]    Frequency of Communication with Friends and Family: Three times a week  Frequency of Social Gatherings with Friends and Family: Three times a week    Attends Religious Services: More than 4 times per year    Active Member of Clubs or Organizations: No    Attends Banker Meetings: Never    Marital Status: Widowed    Objective:  BP 106/62   Pulse 70   Temp (!) 96.9 F (36.1 C)   Ht 5' 7.5" (1.715 m)   Wt 197 lb (89.4 kg)   SpO2 98%   BMI 30.40 kg/m      10/22/2022    2:36 PM 09/17/2022   11:02 AM 09/17/2022   10:36 AM  BP/Weight  Systolic BP 106 132 146  Diastolic BP 62 79 68  Wt. (Lbs) 197  194.8  BMI 30.4 kg/m2  30.06 kg/m2    Physical Exam Vitals reviewed.  Constitutional:      Appearance: Normal appearance. She is obese.  Neck:     Vascular: No carotid bruit.  Cardiovascular:     Rate and Rhythm: Normal rate and regular rhythm.     Heart sounds: Normal heart sounds.  Pulmonary:     Effort: Pulmonary effort is normal. No respiratory distress.     Breath sounds: Normal breath sounds.  Abdominal:     General: Abdomen is flat. Bowel sounds are normal.     Palpations: Abdomen  is soft.     Tenderness: There is no abdominal tenderness.  Neurological:     Mental Status: She is alert and oriented to person, place, and time.  Psychiatric:        Mood and Affect: Mood normal.        Behavior: Behavior normal.     Diabetic Foot Exam - Simple   Simple Foot Form Diabetic Foot exam was performed with the following findings: Yes 10/22/2022  3:21 PM  Visual Inspection See comments: Yes Sensation Testing Intact to touch and monofilament testing bilaterally: Yes Pulse Check Posterior Tibialis and Dorsalis pulse intact bilaterally: Yes Comments Patient has BL large bunions. 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      Lab Results  Component Value Date   WBC 7.4 10/23/2022   HGB 12.2 10/23/2022   HCT 36.5 10/23/2022   PLT 234 10/23/2022   GLUCOSE 89 10/23/2022   CHOL 104 10/23/2022   TRIG 89 10/23/2022   HDL 48 10/23/2022   LDLCALC 39 10/23/2022   ALT 12 10/23/2022   AST 17 10/23/2022   NA 142 10/23/2022   K 4.8 10/23/2022   CL 100 10/23/2022   CREATININE 1.95 (H) 10/23/2022   BUN 37 (H) 10/23/2022   CO2 25 10/23/2022   TSH 2.580 04/16/2022   INR 0.94 04/21/2018   HGBA1C 6.5 (H) 10/23/2022   MICROALBUR 2.1 (H) 07/26/2014      Assessment & Plan:    CAD in native artery Assessment & Plan: Stable.  On imdur, plavix, repatha, carvedilol 6.25 mg twice daily, hydralazine 25 mg twice daily, and ranexa 500 mg twice daily.    Mixed hyperlipidemia Assessment & Plan: Well controlled.  Statin intolerant.  Continue zetia and repatha. Labs ordered.   Orders: -     Lipid panel; Future  Hypertensive heart and renal disease with CHF [facility]) Assessment & Plan: Well controlled.  No changes to medicines. on imdur, plavix, repatha, carvedilol 6.25 mg twice daily, hydralazine 25 mg twice daily, and ranexa 500 mg twice daily.  Continue to work on eating a healthy diet and exercise.  Labs drawn today.  Orders: -     Comprehensive metabolic panel;  Future  Gastroesophageal reflux disease without esophagitis Assessment & Plan: The current medical regimen is effective;  continue present plan and medications. Continue pantoprazole 40 mg  daily and pepcid 10 mg 2 times daily as needed.    Controlled type 2 diabetes mellitus with diabetic neuropathy, with long-term current use of insulin (HCC) Assessment & Plan: Control: not at goal Recommend: CGM Recommend check feet daily. Recommend annual eye exams. Medicines: Continue jardiance 25 mg daily. Continue Novolin 70/30 to 25 U before breakfast and tradjenta 5 mg daily. Continue to work on eating a healthy diet and exercise.  Labs drawn today.     Orders: -     CBC with Differential/Platelet; Future -     Hemoglobin A1c; Future -     Microalbumin / creatinine urine ratio; Future  Acquired hypothyroidism Assessment & Plan: Previously well controlled Continue Synthroid at current dose  Recheck TSH and adjust Synthroid as indicated     Chronic gout without tophus, unspecified cause, unspecified site Assessment & Plan: The current medical regimen is effective;  continue present plan and medications.  Continue allopurinol 300 mg daily.    Class 1 obesity due to excess calories with serious comorbidity and body mass index (BMI) of 30.0 to 30.9 in adult Assessment & Plan: Recommend continue to work on eating healthy diet and exercise. Comorbidities: hyperlipidemia, hypertension      No orders of the defined types were placed in this encounter.   Orders Placed This Encounter  Procedures   CBC with Differential/Platelet   Comprehensive metabolic panel   Lipid panel   Hemoglobin A1c   Microalbumin / creatinine urine ratio     Follow-up: Return in about 3 months (around 01/21/2023) for chronic, fasting.   I,Katherina A Bramblett,acting as a scribe for Blane Ohara, MD.,have documented all relevant documentation on the behalf of Blane Ohara, MD,as directed by  Blane Ohara, MD while in the presence of Blane Ohara, MD.   An After Visit Summary was printed and given to the patient.  I attest that I have reviewed this visit and agree with the plan scribed by my staff.   Blane Ohara, MD [facility] [phone]

## 2022-10-23 ENCOUNTER — Other Ambulatory Visit: Payer: Medicare HMO

## 2022-10-23 DIAGNOSIS — I130 Hypertensive heart and chronic kidney disease with heart failure and stage 1 through stage 4 chronic kidney disease, or unspecified chronic kidney disease: Secondary | ICD-10-CM | POA: Diagnosis not present

## 2022-10-23 DIAGNOSIS — E782 Mixed hyperlipidemia: Secondary | ICD-10-CM | POA: Diagnosis not present

## 2022-10-23 DIAGNOSIS — E1140 Type 2 diabetes mellitus with diabetic neuropathy, unspecified: Secondary | ICD-10-CM

## 2022-10-23 DIAGNOSIS — Z794 Long term (current) use of insulin: Secondary | ICD-10-CM | POA: Diagnosis not present

## 2022-10-24 LAB — COMPREHENSIVE METABOLIC PANEL
ALT: 12 IU/L (ref 0–32)
AST: 17 IU/L (ref 0–40)
Albumin/Globulin Ratio: 1.7 (ref 1.2–2.2)
Albumin: 4.2 g/dL (ref 3.7–4.7)
Alkaline Phosphatase: 151 IU/L — ABNORMAL HIGH (ref 44–121)
BUN/Creatinine Ratio: 19 (ref 12–28)
BUN: 37 mg/dL — ABNORMAL HIGH (ref 8–27)
Bilirubin Total: 0.5 mg/dL (ref 0.0–1.2)
CO2: 25 mmol/L (ref 20–29)
Calcium: 9.8 mg/dL (ref 8.7–10.3)
Chloride: 100 mmol/L (ref 96–106)
Creatinine, Ser: 1.95 mg/dL — ABNORMAL HIGH (ref 0.57–1.00)
Globulin, Total: 2.5 g/dL (ref 1.5–4.5)
Glucose: 89 mg/dL (ref 70–99)
Potassium: 4.8 mmol/L (ref 3.5–5.2)
Sodium: 142 mmol/L (ref 134–144)
Total Protein: 6.7 g/dL (ref 6.0–8.5)
eGFR: 25 mL/min/{1.73_m2} — ABNORMAL LOW (ref >59)

## 2022-10-24 LAB — CBC WITH DIFFERENTIAL/PLATELET
Basophils Absolute: 0 10*3/uL (ref 0.0–0.2)
Basos: 1 %
EOS (ABSOLUTE): 0 10*3/uL (ref 0.0–0.4)
Eos: 1 %
Hematocrit: 36.5 % (ref 34.0–46.6)
Hemoglobin: 12.2 g/dL (ref 11.1–15.9)
Immature Grans (Abs): 0 10*3/uL (ref 0.0–0.1)
Immature Granulocytes: 0 %
Lymphocytes Absolute: 1.6 10*3/uL (ref 0.7–3.1)
Lymphs: 22 %
MCH: 30.5 pg (ref 26.6–33.0)
MCHC: 33.4 g/dL (ref 31.5–35.7)
MCV: 91 fL (ref 79–97)
Monocytes Absolute: 0.6 10*3/uL (ref 0.1–0.9)
Monocytes: 9 %
Neutrophils Absolute: 5.1 10*3/uL (ref 1.4–7.0)
Neutrophils: 67 %
Platelets: 234 10*3/uL (ref 150–450)
RBC: 4 x10E6/uL (ref 3.77–5.28)
RDW: 13.4 % (ref 11.7–15.4)
WBC: 7.4 10*3/uL (ref 3.4–10.8)

## 2022-10-24 LAB — HEMOGLOBIN A1C
Est. average glucose Bld gHb Est-mCnc: 140 mg/dL
Hgb A1c MFr Bld: 6.5 % — ABNORMAL HIGH (ref 4.8–5.6)

## 2022-10-24 LAB — CARDIOVASCULAR RISK ASSESSMENT

## 2022-10-24 LAB — LIPID PANEL
Chol/HDL Ratio: 2.2 ratio (ref 0.0–4.4)
Cholesterol, Total: 104 mg/dL (ref 100–199)
HDL: 48 mg/dL (ref >39)
LDL Chol Calc (NIH): 39 mg/dL (ref 0–99)
Triglycerides: 89 mg/dL (ref 0–149)
VLDL Cholesterol Cal: 17 mg/dL (ref 5–40)

## 2022-10-24 LAB — MICROALBUMIN / CREATININE URINE RATIO
Creatinine, Urine: 79.4 mg/dL
Microalb/Creat Ratio: 56 mg/g creat — ABNORMAL HIGH (ref 0–29)
Microalbumin, Urine: 44.2 ug/mL

## 2022-10-27 DIAGNOSIS — I10 Essential (primary) hypertension: Secondary | ICD-10-CM | POA: Insufficient documentation

## 2022-10-27 NOTE — Assessment & Plan Note (Signed)
Well controlled.  No changes to medicines. on imdur, plavix, repatha, carvedilol 6.25 mg twice daily, hydralazine 25 mg twice daily, and ranexa 500 mg twice daily.  Continue to work on eating a healthy diet and exercise.  Labs drawn today.

## 2022-10-27 NOTE — Assessment & Plan Note (Signed)
Stable.  On imdur, plavix, repatha, carvedilol 6.25 mg twice daily, hydralazine 25 mg twice daily, and ranexa 500 mg twice daily.

## 2022-10-27 NOTE — Assessment & Plan Note (Signed)
>>  ASSESSMENT AND PLAN FOR HYPERTENSIVE HEART AND RENAL DISEASE WITH CHF (HCC) WRITTEN ON 10/28/2022  2:42 PM BY Shalawn Wynder, MD  Well controlled.  No changes to medicines. on imdur, plavix, repatha, carvedilol 6.25 mg twice daily, hydralazine 25 mg twice daily, and ranexa 500 mg twice daily.  Continue to work on eating a healthy diet and exercise.  Labs drawn today.

## 2022-10-28 NOTE — Assessment & Plan Note (Signed)
Control: not at goal Recommend: CGM Recommend check feet daily. Recommend annual eye exams. Medicines: Continue jardiance 25 mg daily. Continue Novolin 70/30 to 25 U before breakfast and tradjenta 5 mg daily. Continue to work on eating a healthy diet and exercise.  Labs drawn today.

## 2022-10-28 NOTE — Assessment & Plan Note (Signed)
Well controlled.  Statin intolerant.  Continue zetia and repatha. Labs ordered.

## 2022-10-28 NOTE — Assessment & Plan Note (Signed)
Recommend continue to work on eating healthy diet and exercise. Comorbidities: hyperlipidemia, hypertension. 

## 2022-10-28 NOTE — Assessment & Plan Note (Signed)
The current medical regimen is effective;  continue present plan and medications. Continue pantoprazole 40 mg  daily and pepcid 10 mg 2 times daily as needed.  

## 2022-10-28 NOTE — Assessment & Plan Note (Signed)
Previously well controlled Continue Synthroid at current dose  Recheck TSH and adjust Synthroid as indicated   

## 2022-10-28 NOTE — Assessment & Plan Note (Signed)
The current medical regimen is effective;  continue present plan and medications. Continue allopurinol 300 mg daily.  

## 2022-11-06 ENCOUNTER — Other Ambulatory Visit: Payer: Self-pay

## 2022-11-06 DIAGNOSIS — Z96659 Presence of unspecified artificial knee joint: Secondary | ICD-10-CM | POA: Diagnosis not present

## 2022-11-06 DIAGNOSIS — Z96652 Presence of left artificial knee joint: Secondary | ICD-10-CM | POA: Diagnosis not present

## 2022-11-06 DIAGNOSIS — T8484XA Pain due to internal orthopedic prosthetic devices, implants and grafts, initial encounter: Secondary | ICD-10-CM | POA: Diagnosis not present

## 2022-11-06 DIAGNOSIS — E039 Hypothyroidism, unspecified: Secondary | ICD-10-CM

## 2022-11-06 DIAGNOSIS — M25462 Effusion, left knee: Secondary | ICD-10-CM | POA: Diagnosis not present

## 2022-11-06 DIAGNOSIS — M25562 Pain in left knee: Secondary | ICD-10-CM | POA: Diagnosis not present

## 2022-11-06 MED ORDER — TRAMADOL HCL 50 MG PO TABS
50.0000 mg | ORAL_TABLET | Freq: Four times a day (QID) | ORAL | 2 refills | Status: DC | PRN
Start: 2022-11-06 — End: 2022-12-17

## 2022-11-08 DIAGNOSIS — E113592 Type 2 diabetes mellitus with proliferative diabetic retinopathy without macular edema, left eye: Secondary | ICD-10-CM | POA: Diagnosis not present

## 2022-11-15 DIAGNOSIS — R600 Localized edema: Secondary | ICD-10-CM | POA: Insufficient documentation

## 2022-11-15 DIAGNOSIS — L600 Ingrowing nail: Secondary | ICD-10-CM | POA: Diagnosis not present

## 2022-11-23 DIAGNOSIS — E1122 Type 2 diabetes mellitus with diabetic chronic kidney disease: Secondary | ICD-10-CM | POA: Diagnosis not present

## 2022-12-10 ENCOUNTER — Telehealth: Payer: Self-pay

## 2022-12-10 NOTE — Telephone Encounter (Signed)
Patient called stating that she has noticed a knot over top of her navel area. She states that she noticed it after doing some lifting over the past week and it has only gotten worse as far as pain wise. She states that the pain now is a 9 and I told patient if her pain was this bad she would need to go on to the ER. Patient stated that no she was not going to the ER because she would have to sit there for 12 hours with sicker people and catch something else and it would take them too long when all she needs is just to come here to see her doctor. I advised patient again to go to ER or urgent care and she still refused. Please advise?

## 2022-12-10 NOTE — Telephone Encounter (Signed)
Patient informed and patient scheduled

## 2022-12-11 ENCOUNTER — Encounter: Payer: Self-pay | Admitting: Family Medicine

## 2022-12-11 ENCOUNTER — Ambulatory Visit (INDEPENDENT_AMBULATORY_CARE_PROVIDER_SITE_OTHER): Payer: Medicare HMO | Admitting: Family Medicine

## 2022-12-11 VITALS — BP 138/70 | HR 64 | Temp 96.8°F | Ht 67.0 in | Wt 197.0 lb

## 2022-12-11 DIAGNOSIS — B029 Zoster without complications: Secondary | ICD-10-CM | POA: Diagnosis not present

## 2022-12-11 MED ORDER — GABAPENTIN 300 MG PO CAPS: 300.0000 mg | ORAL_CAPSULE | Freq: Three times a day (TID) | ORAL | 1 refills | Status: DC | PRN

## 2022-12-11 MED ORDER — VALACYCLOVIR HCL 500 MG PO TABS: 500.0000 mg | ORAL_TABLET | Freq: Three times a day (TID) | ORAL | 0 refills | Status: DC

## 2022-12-11 MED ORDER — PREDNISONE 20 MG PO TABS: ORAL_TABLET | ORAL | 0 refills | Status: AC

## 2022-12-11 NOTE — Assessment & Plan Note (Signed)
Increase insulin 10 units  daily  if sugars are over 200 while on prednisone. Increase gabapentin 300 mg three times daily, prn  Prednisone and Valtrex sent to pharmacy. Take as directed.

## 2022-12-11 NOTE — Patient Instructions (Addendum)
Increase insulin 10 units  daily  if sugars are over 200 while on prednisone. Increase gabapentin 300 mg three times daily  Prednisone and Valtrex sent to pharmacy. Take as directed.

## 2022-12-11 NOTE — Progress Notes (Signed)
Acute Office Visit  Subjective:    Patient ID: KD BORUCH, female    DOB: 06-Jun-1936, 87 y.o.   MRN: [id number]  Chief Complaint  Patient presents with   Abdominal Pain    HPI: Patient is in today for abdominal pain RLQ x 1 week, sharp with movement and when touched, hx of appendectomy. Has had a diverticulitis flare up "years ago". Has been eating nuts. Has had some nausea. Pain radiates to back as well. History of shingles. Has red areas on her right lower abdomin and right lower back.   Past Medical History:  Diagnosis Date   Anemia    a. Noted on labs 02/2014 possibly procedurally related.   Asthma    CAD S/P percutaneous coronary angioplasty 01/22/2014   A. (12/2013): POBA - OM2 99% (very tortuous segment) - reduced ~ 50% & TIMI 3 flow, 80-90% stenosis in mid PDA, Irregularities <50% in mRCA, pLAD and prox LCx; b. 02/17/14: Moderate sized fixed defect in Lateral wall --> cath with OM2 restenosis & otherwise stable --> Xience Alpine DES x 2 (2.25 mm x 12 & 8 mm). c. NSTEMI 02/2014 s/p PTCA/balloon angioplasty only to small caliber PDA.   Carotid stenosis    Carotid US 11/17: L 1-39; FU prn   Chronic systolic heart failure (HCC)    a. ICM b. ECHO (01/2014): EF 25-30%, grade I DD, trivial MR. c. EF by Myoview 02/17/14 = ~53%. d. EF by cath 02/24/14 - improved to 50-55%.   CKD (chronic kidney disease), stage III (HCC)    Contrast media allergy    Environmental allergies    GERD (gastroesophageal reflux disease)    Gout, unspecified    HOH (hard of hearing)    Hyperlipidemia    a. H/o intolerance to Zocor, Lipitor. Had muscle aches on higher dose Crestor.   Hypertension    Hypothyroidism    LBBB (left bundle branch block)    a. Transient during 02/2014 admission.   NSTEMI (non-ST elevated myocardial infarction) (HCC) 2014; 12/2013, 01/2014   QT prolongation    a. Noted on EKG 02/2014.   Sinus bradycardia    a. HR 40s-50s during 02/2014 admission, limiting BB dose.   TIA (transient  ischemic attack) 1990's   a. TIA vs stroke 1990's "mild" - sounds like a TIA as she was told she had stroke symptoms but negative scans. Denies residual effects.   Type II diabetes mellitus [facility])     Past Surgical History:  Procedure Laterality Date   ABDOMINAL HYSTERECTOMY     complete   AMPUTATION TOE Right 02/25/2021   hammer toe.   APPENDECTOMY  1959   CARDIAC CATHETERIZATION  02/24/2014   Procedure: CORONARY BALLOON ANGIOPLASTY;  Surgeon: Kathleene Hazel, MD;  Location: [facility] CATH LAB;  Service: Cardiovascular;;   CARDIAC CATHETERIZATION N/A 04/20/2015   Procedure: Left Heart Cath and Coronary Angiography;  Surgeon: Lyn Records, MD;  Location: [facility] INVASIVE CV LAB;  Service: Cardiovascular;  Laterality: N/A;   CATARACT EXTRACTION W/ INTRAOCULAR LENS  IMPLANT, BILATERAL Bilateral 2013   CESAREAN SECTION  1959; 1960   CHOLECYSTECTOMY  1989   CORONARY ANGIOPLASTY  12/2013   POBA - OM2   CORONARY ARTERY BYPASS GRAFT N/A 04/25/2015   Procedure: CORONARY ARTERY BYPASS GRAFTING (CABG) x four,  using left internal mammary artery and right leg greater saphenous vein harvested endoscopically;  Surgeon: Alleen Borne, MD;  Location: [facility] OR;  Service: Open Heart Surgery;  Laterality: N/A;  JOINT REPLACEMENT Right 10 years ago    right knee (TKR)   LEFT HEART CATHETERIZATION WITH CORONARY ANGIOGRAM N/A 01/22/2014   Procedure: LEFT HEART CATHETERIZATION WITH CORONARY ANGIOGRAM;  Surgeon: Lesleigh Noe, MD;  Location: [facility] CATH LAB;  Service: Cardiovascular;  Laterality: N/A;   LEFT HEART CATHETERIZATION WITH CORONARY ANGIOGRAM N/A 02/17/2014   Procedure: LEFT HEART CATHETERIZATION WITH CORONARY ANGIOGRAM;  Surgeon: Kathleene Hazel, MD;  Location: [facility] CATH LAB;  Service: Cardiovascular;  Laterality: N/A;   LEFT HEART CATHETERIZATION WITH CORONARY ANGIOGRAM N/A 02/24/2014   Procedure: LEFT HEART CATHETERIZATION WITH CORONARY ANGIOGRAM;  Surgeon: Kathleene Hazel, MD;  Location:  [facility] CATH LAB;  Service: Cardiovascular;  Laterality: N/A;   LUMBAR LAMINECTOMY/DECOMPRESSION MICRODISCECTOMY Left 12/22/2014   Procedure: CENTRAL DECOMPRESSIVE LUMBAR, AND LAMINECTOMY L5-S1,  S1-S2 FOR SPINAL STENOSIS FORAMINOTOMY L5, S1, S2 ROOTS ON LEFT FOR FORAMINAL STENOSIS, AND DECOMPRESSION L5-S1, S1-S2 ACCORDING TO LABELING OF [facility] X-RAYS;  Surgeon: Ranee Gosselin, MD;  Location: [facility] ORS;  Service: Orthopedics;  Laterality: Left;   PERCUTANEOUS CORONARY STENT INTERVENTION (PCI-S)  02/17/2014   For restenosis of OM2 - PCI with Xience Apline DES 2.25 mm x 12 mm & 2.25 mm x 8 mm overlapping   REDUCTION MAMMAPLASTY Bilateral 1990's   SHOULDER OPEN ROTATOR CUFF REPAIR Bilateral 1980's - 1990's   TEE WITHOUT CARDIOVERSION N/A 04/25/2015   Procedure: TRANSESOPHAGEAL ECHOCARDIOGRAM (TEE);  Surgeon: Alleen Borne, MD;  Location[facility] OR;  Service: Open Heart Surgery;  Laterality: N/A;   TOTAL KNEE ARTHROPLASTY Right 2009   TOTAL KNEE ARTHROPLASTY Left 04/21/2018   TKR;  Surgeon: Dannielle Huh, MD;  Location: [facility] ORS;  Service: Orthopedics;  Laterality: Left;  Adductor Block    Family History  Problem Relation Age of Onset   Hypertension Mother    Diabetes Mother    Heart attack Mother 43   Heart disease Father    Emphysema Father    Diabetes Brother    Heart disease Brother    Cancer Sister    Stroke Neg Hx     Social History   Socioeconomic History   Marital status: Widowed    Spouse name: Not on file   Number of children: 2   Years of education: Not on file   Highest education level: Not on file  Occupational History   Occupation: Retired  Tobacco Use   Smoking status: Never   Smokeless tobacco: Never  Vaping Use   Vaping Use: Never used  Substance and Sexual Activity   Alcohol use: No   Drug use: No   Sexual activity: Never  Other Topics Concern   Not on file  Social History Narrative   Lives in [place] by herself.    Social Determinants of Health    Financial Resource Strain: Low Risk  (10/22/2022)   Overall Financial Resource Strain (CARDIA)    Difficulty of Paying Living Expenses: Not hard at all  Food Insecurity: No Food Insecurity (10/22/2022)   Hunger Vital Sign    Worried About Running Out of Food in the Last Year: Never true    Ran Out of Food in the Last Year: Never true  Transportation Needs: No Transportation Needs (10/22/2022)   PRAPARE - Administrator, Civil Service (Medical): No    Lack of Transportation (Non-Medical): No  Physical Activity: Sufficiently Active (10/22/2022)   Exercise Vital Sign    Days of Exercise per Week: 6 days    Minutes of Exercise  per Session: 40 min  Stress: No Stress Concern Present (10/22/2022)   [employer] of Occupational Health - Occupational Stress Questionnaire    Feeling of Stress : Not at all  Social Connections: Moderately Isolated (10/22/2022)   Social Connection and Isolation Panel [NHANES]    Frequency of Communication with Friends and Family: Three times a week    Frequency of Social Gatherings with Friends and Family: Three times a week    Attends Religious Services: More than 4 times per year    Active Member of Clubs or Organizations: No    Attends Banker Meetings: Never    Marital Status: Widowed  Intimate Partner Violence: Not At Risk (10/22/2022)   Humiliation, Afraid, Rape, and Kick questionnaire    Fear of Current or Ex-Partner: No    Emotionally Abused: No    Physically Abused: No    Sexually Abused: No    Outpatient Medications Prior to Visit  Medication Sig Dispense Refill   albuterol (VENTOLIN HFA) 108 (90 Base) MCG/ACT inhaler Inhale 2 puffs into the lungs every 6 (six) hours as needed for wheezing or shortness of breath. 24 g 1   Alcohol Swabs (ALCOHOL WIPES) 70 % PADS USE ONE PAD TO CLEAN AREA TO CHECK BLOOD SUGAR AS  DIRECTED     allopurinol (ZYLOPRIM) 300 MG tablet Take 300 mg by mouth every morning.      Biotin 5000 MCG CAPS  Take 5,000 mcg by mouth every morning.     Blood Glucose Monitoring Suppl (TRUE METRIX METER) DEVI Use to test blood sugars once daily. ICD: E11.65     calcium carbonate (OSCAL) 1500 (600 Ca) MG TABS tablet Take 600 mg by mouth 2 (two) times daily with a meal.     carvedilol (COREG) 6.25 MG tablet Take 1 tablet (6.25 mg total) by mouth daily. 90 tablet 3   clopidogrel (PLAVIX) 75 MG tablet TAKE 1 TABLET EVERY DAY (NEED TO SCHEDULE A FOLLOW UP VISIT) 90 tablet 3   colchicine 0.6 MG tablet Take 1 tablet (0.6 mg total) by mouth 2 (two) times daily as needed for up to 7 days (gout flare up.). 30 tablet 1   Continuous Blood Gluc Receiver (FREESTYLE LIBRE READER) DEVI 1 each by Does not apply route as directed. 1 each 0   empagliflozin (JARDIANCE) 25 MG TABS tablet Take 1 tablet (25 mg total) by mouth daily before breakfast. 90 tablet 0   Evolocumab (REPATHA SURECLICK) 140 MG/ML SOAJ Inject 140 mg into the skin every 14 (fourteen) days. 2 mL 11   ezetimibe (ZETIA) 10 MG tablet Take 10 mg by mouth daily.     famotidine (PEPCID) 10 MG tablet Take 10 mg by mouth 2 (two) times daily as needed for heartburn or indigestion.      fluticasone (FLONASE) 50 MCG/ACT nasal spray USE 1 SPRAY NASALLY EVERY DAY     furosemide (LASIX) 80 MG tablet TAKE 1 TABLET EVERY DAY 90 tablet 3   glucose blood (TRUE METRIX BLOOD GLUCOSE TEST) test strip Use as instructed 100 each 12   hydrALAZINE (APRESOLINE) 25 MG tablet Take 25 mg by mouth 2 (two) times daily.     insulin NPH-regular Human (NOVOLIN 70/30) (70-30) 100 UNIT/ML injection Inject 25-32 Units into the skin daily. Reports 20 units in morning and 10-12 at night if needed.     isosorbide mononitrate (IMDUR) 60 MG 24 hr tablet TAKE 1 TABLET EVERY DAY 90 tablet 3   levothyroxine (SYNTHROID, LEVOTHROID) 50  MCG tablet Take 50 mcg by mouth every morning.      methocarbamol (ROBAXIN) 500 MG tablet Take 1-2 tablets (500-1,000 mg total) by mouth every 6 (six) hours as needed for  muscle spasms. 60 tablet 0   nitroGLYCERIN (NITROSTAT) 0.4 MG SL tablet DISSOLVE 1 TABLET UNDER THE TONGUE EVERY 5 MINUTES AS NEEDED FOR CHEST PAIN UP TO 3 TIMES. CALL EMS IF YOU NEED A SECOND. 25 tablet 11   pantoprazole (PROTONIX) 40 MG tablet TAKE 1 TABLET EVERY DAY 90 tablet 3   potassium chloride (KLOR-CON) 10 MEQ tablet Take 10 mEq by mouth as needed. To take one (1) tablet if you take extra lasix for weight gain.     ranolazine (RANEXA) 500 MG 12 hr tablet Take 1 tablet (500 mg total) by mouth 2 (two) times daily. 60 tablet 6   TRADJENTA 5 MG TABS tablet TAKE 1 TABLET EVERY DAY 90 tablet 3   traMADol (ULTRAM) 50 MG tablet Take 1 tablet (50 mg total) by mouth every 6 (six) hours as needed. 30 tablet 2   TRUEplus Lancets 33G MISC Use to test blood sugar once daily. ICD: E11.65     gabapentin (NEURONTIN) 300 MG capsule Take 300 mg by mouth daily.     No facility-administered medications prior to visit.    Allergies  Allergen Reactions   Iodinated Contrast Media Anaphylaxis and Other (See Comments)    unknown Contrast agent "Makes me shake all over"   Atorvastatin Other (See Comments)    Muscle aches- severe   Corticosteroids Other (See Comments)    Elevated blood sugar     Hydrocodone Nausea And Vomiting   Other Nausea And Vomiting    Pain medications except can tramadol   Oxycodone Nausea And Vomiting   Simvastatin Other (See Comments)    Muscle aches- severe   Cortizone-10 [Hydrocortisone] Other (See Comments)    unknown    Review of Systems  Constitutional:  Negative for chills, fatigue and fever.  HENT:  Negative for congestion, ear pain, rhinorrhea and sore throat.   Respiratory:  Negative for cough and shortness of breath.   Cardiovascular:  Negative for chest pain.  Gastrointestinal:  Positive for abdominal pain. Negative for constipation, diarrhea, nausea and vomiting.  Genitourinary:  Negative for dysuria and urgency.  Musculoskeletal:  Negative for back pain and  myalgias.  Neurological:  Negative for dizziness, weakness, light-headedness and headaches.  Psychiatric/Behavioral:  Negative for dysphoric mood. The patient is not nervous/anxious.        Objective:        12/11/2022    9:00 AM 10/22/2022    2:36 PM 09/17/2022   11:02 AM  Vitals with BMI  Height 5\' 7"  5' 7.5"   Weight 197 lbs 197 lbs   BMI 30.85 30.38   Systolic 138 106 161  Diastolic 70 62 79  Pulse 64 70     No data found.   Physical Exam Vitals reviewed.  Constitutional:      Appearance: Normal appearance. She is normal weight.  Cardiovascular:     Rate and Rhythm: Normal rate and regular rhythm.     Heart sounds: Normal heart sounds.  Pulmonary:     Effort: Pulmonary effort is normal. No respiratory distress.     Breath sounds: Normal breath sounds.  Abdominal:     General: Abdomen is flat. Bowel sounds are normal.     Palpations: Abdomen is soft.     Tenderness: There is abdominal tenderness (  RLQ over rash).  Skin:      Neurological:     Mental Status: She is alert and oriented to person, place, and time.  Psychiatric:        Mood and Affect: Mood normal.        Behavior: Behavior normal.     Health Maintenance Due  Topic Date Due   COVID-19 Vaccine (1) Never done   Zoster Vaccines- Shingrix (1 of 2) Never done   OPHTHALMOLOGY EXAM  11/03/2022   Medicare Annual Wellness (AWV)  12/07/2022    There are no preventive care reminders to display for this patient.   Lab Results  Component Value Date   TSH 2.580 04/16/2022   Lab Results  Component Value Date   WBC 7.4 10/23/2022   HGB 12.2 10/23/2022   HCT 36.5 10/23/2022   MCV 91 10/23/2022   PLT 234 10/23/2022   Lab Results  Component Value Date   NA 142 10/23/2022   K 4.8 10/23/2022   CO2 25 10/23/2022   GLUCOSE 89 10/23/2022   BUN 37 (H) 10/23/2022   CREATININE 1.95 (H) 10/23/2022   BILITOT 0.5 10/23/2022   ALKPHOS 151 (H) 10/23/2022   AST 17 10/23/2022   ALT 12 10/23/2022   PROT  6.7 10/23/2022   ALBUMIN 4.2 10/23/2022   CALCIUM 9.8 10/23/2022   ANIONGAP 10 04/22/2018   EGFR 25 (L) 10/23/2022   GFR 52.99 (L) 03/04/2014   Lab Results  Component Value Date   CHOL 104 10/23/2022   Lab Results  Component Value Date   HDL 48 10/23/2022   Lab Results  Component Value Date   LDLCALC 39 10/23/2022   Lab Results  Component Value Date   TRIG 89 10/23/2022   Lab Results  Component Value Date   CHOLHDL 2.2 10/23/2022   Lab Results  Component Value Date   HGBA1C 6.5 (H) 10/23/2022       Assessment & Plan:  Herpes zoster without complication Assessment & Plan: Increase insulin 10 units  daily  if sugars are over 200 while on prednisone. Increase gabapentin 300 mg three times daily, prn  Prednisone and Valtrex sent to pharmacy. Take as directed.   Other orders -     valACYclovir HCl; Take 1 tablet (500 mg total) by mouth 3 (three) times daily.  Dispense: 21 tablet; Refill: 0 -     predniSONE; Take 3 tablets (60 mg total) by mouth daily with breakfast for 3 days, THEN 2 tablets (40 mg total) daily with breakfast for 3 days, THEN 1 tablet (20 mg total) daily with breakfast for 3 days.  Dispense: 18 tablet; Refill: 0 -     Gabapentin; Take 1 capsule (300 mg total) by mouth 3 (three) times daily as needed.  Dispense: 90 capsule; Refill: 1     Meds ordered this encounter  Medications   valACYclovir (VALTREX) 500 MG tablet    Sig: Take 1 tablet (500 mg total) by mouth 3 (three) times daily.    Dispense:  21 tablet    Refill:  0   predniSONE (DELTASONE) 20 MG tablet    Sig: Take 3 tablets (60 mg total) by mouth daily with breakfast for 3 days, THEN 2 tablets (40 mg total) daily with breakfast for 3 days, THEN 1 tablet (20 mg total) daily with breakfast for 3 days.    Dispense:  18 tablet    Refill:  0   gabapentin (NEURONTIN) 300 MG capsule    Sig:  Take 1 capsule (300 mg total) by mouth 3 (three) times daily as needed.    Dispense:  90 capsule     Refill:  1    No orders of the defined types were placed in this encounter.    Follow-up: Return if symptoms worsen or fail to improve.  An After Visit Summary was printed and given to the patient.  Blane Ohara, MD [facility] [phone]

## 2022-12-17 ENCOUNTER — Other Ambulatory Visit: Payer: Self-pay | Admitting: Family Medicine

## 2022-12-17 DIAGNOSIS — E039 Hypothyroidism, unspecified: Secondary | ICD-10-CM

## 2022-12-17 MED ORDER — TRAMADOL HCL 50 MG PO TABS
50.0000 mg | ORAL_TABLET | Freq: Four times a day (QID) | ORAL | 2 refills | Status: DC | PRN
Start: 2022-12-17 — End: 2023-02-18

## 2022-12-17 MED ORDER — ALLOPURINOL 300 MG PO TABS: 300.0000 mg | ORAL_TABLET | Freq: Every morning | ORAL | 1 refills | Status: DC

## 2023-01-02 DIAGNOSIS — E1122 Type 2 diabetes mellitus with diabetic chronic kidney disease: Secondary | ICD-10-CM | POA: Diagnosis not present

## 2023-01-28 ENCOUNTER — Telehealth: Payer: Self-pay

## 2023-01-28 ENCOUNTER — Other Ambulatory Visit: Payer: Self-pay

## 2023-01-28 NOTE — Telephone Encounter (Signed)
Prescription Request  01/28/2023   What is the name of the medication or equipment?  colchicine 0.6 MG tablet   Have you contacted your pharmacy to request a refill? No   Which pharmacy would you like this sent to?  [facility] - [place], [place] - [address] [place] [address] Phone: [phone] Fax: [phone]   Patient notified that their request is being sent to the clinical staff for review and that they should receive a response within 2 business days.   Please advise at [facility] [phone]

## 2023-01-29 MED ORDER — COLCHICINE 0.6 MG PO TABS: 0.6000 mg | ORAL_TABLET | Freq: Two times a day (BID) | ORAL | 1 refills | Status: DC | PRN

## 2023-02-02 DIAGNOSIS — E1122 Type 2 diabetes mellitus with diabetic chronic kidney disease: Secondary | ICD-10-CM | POA: Diagnosis not present

## 2023-02-12 ENCOUNTER — Ambulatory Visit: Payer: Medicare HMO | Admitting: Family Medicine

## 2023-02-18 ENCOUNTER — Other Ambulatory Visit: Payer: Self-pay | Admitting: Family Medicine

## 2023-02-18 ENCOUNTER — Encounter: Payer: Self-pay | Admitting: Family Medicine

## 2023-02-18 ENCOUNTER — Ambulatory Visit (INDEPENDENT_AMBULATORY_CARE_PROVIDER_SITE_OTHER): Payer: Medicare HMO | Admitting: Family Medicine

## 2023-02-18 VITALS — BP 140/68 | HR 78 | Temp 96.9°F | Resp 16 | Ht 67.0 in | Wt 190.2 lb

## 2023-02-18 DIAGNOSIS — E782 Mixed hyperlipidemia: Secondary | ICD-10-CM

## 2023-02-18 DIAGNOSIS — I2510 Atherosclerotic heart disease of native coronary artery without angina pectoris: Secondary | ICD-10-CM | POA: Diagnosis not present

## 2023-02-18 DIAGNOSIS — N3000 Acute cystitis without hematuria: Secondary | ICD-10-CM | POA: Diagnosis not present

## 2023-02-18 DIAGNOSIS — E039 Hypothyroidism, unspecified: Secondary | ICD-10-CM

## 2023-02-18 DIAGNOSIS — K219 Gastro-esophageal reflux disease without esophagitis: Secondary | ICD-10-CM

## 2023-02-18 DIAGNOSIS — N184 Chronic kidney disease, stage 4 (severe): Secondary | ICD-10-CM

## 2023-02-18 DIAGNOSIS — R55 Syncope and collapse: Secondary | ICD-10-CM

## 2023-02-18 DIAGNOSIS — Z794 Long term (current) use of insulin: Secondary | ICD-10-CM

## 2023-02-18 DIAGNOSIS — E1140 Type 2 diabetes mellitus with diabetic neuropathy, unspecified: Secondary | ICD-10-CM

## 2023-02-18 DIAGNOSIS — N1832 Chronic kidney disease, stage 3b: Secondary | ICD-10-CM

## 2023-02-18 DIAGNOSIS — Z89421 Acquired absence of other right toe(s): Secondary | ICD-10-CM | POA: Diagnosis not present

## 2023-02-18 DIAGNOSIS — I252 Old myocardial infarction: Secondary | ICD-10-CM

## 2023-02-18 DIAGNOSIS — I5032 Chronic diastolic (congestive) heart failure: Secondary | ICD-10-CM

## 2023-02-18 LAB — POCT URINALYSIS DIP (CLINITEK)
Bilirubin, UA: NEGATIVE
Blood, UA: NEGATIVE
Glucose, UA: 250 mg/dL — AB
Ketones, POC UA: NEGATIVE mg/dL
Nitrite, UA: NEGATIVE
Spec Grav, UA: 1.015 (ref 1.010–1.025)
Urobilinogen, UA: 0.2 E.U./dL
pH, UA: 6 (ref 5.0–8.0)

## 2023-02-18 MED ORDER — SULFAMETHOXAZOLE-TRIMETHOPRIM 800-160 MG PO TABS
1.0000 | ORAL_TABLET | Freq: Two times a day (BID) | ORAL | 0 refills | Status: DC
Start: 2023-02-18 — End: 2023-03-29

## 2023-02-18 MED ORDER — CARVEDILOL 6.25 MG PO TABS
6.2500 mg | ORAL_TABLET | Freq: Every day | ORAL | 1 refills | Status: DC
Start: 2023-02-18 — End: 2023-03-29

## 2023-02-18 MED ORDER — RANOLAZINE ER 500 MG PO TB12
500.0000 mg | ORAL_TABLET | Freq: Two times a day (BID) | ORAL | 0 refills | Status: DC
Start: 2023-02-18 — End: 2023-05-02

## 2023-02-18 MED ORDER — ISOSORBIDE MONONITRATE ER 30 MG PO TB24: 30.0000 mg | ORAL_TABLET | Freq: Every day | ORAL | 0 refills | Status: DC

## 2023-02-18 MED ORDER — GABAPENTIN 300 MG PO CAPS
300.0000 mg | ORAL_CAPSULE | Freq: Three times a day (TID) | ORAL | 1 refills | Status: DC | PRN
Start: 2023-02-18 — End: 2023-06-21

## 2023-02-18 NOTE — Progress Notes (Unsigned)
Subjective:  Patient ID: Carol [name], female    DOB: 1935-08-17  Age: 87 y.o. MRN: [id number]  Chief Complaint  Patient presents with   Medical Management of Chronic Issues    HPI CORONARY ARTERY DISEASE/CONGESTIVE HEART FAILURE/hypertension/CKD stage 3b: Stents. 3 heart attacks. CABG. MCHG: Dr. Katrinka Blazing. On zetia 10 mg daily, plavix 75 mg daily, Imdur 60 mg daily, Carvedilol 6.25 mg one twice daily, and lasix 80 mg daily. Patient saw Dr. Katrinka Blazing. Echo 7/20. NTG Sl has with her.   Diabetes: Diabetes: neuropathy.  Last A1C was 6.5. Medications: Novolin 70/30 to 25 U before breakfast and tradjenta 5 mg daily, Jardiance 10 mg daily. .  Glucose log: check once daily in am.  45-200 CGM: free style libre.  Checking feet for sores.  Eats twice a day. Not eating her vegetables. Eating chicken breast, potatoes (air fry.) eating some fruit.    Constipation: resolved for the most part. Occasionally has to take miralax or linzess.  Neuropathy: takes tramadol 50 mg four times a day as needed severe pain. On gabapentin 300 mg twice daily.   Hyperlipidemia: on zetia 10 mg daily, Intolerant to statins.   GERD:  Takes pantoprazole 40 mg daily and uses famotidine otc as needed.   Gout:  patient is on allopurinol 300 mg daily and colchicine twice daily as needed if she has a flare. Takes for about 3 days.   Hypothyroidism: on levothyroxine 50 mcg once daily in am.    Patient feels dizzy and light headed for few seconds. Her vision will go black and has to grab on to something and then it will pass. Has had 2 falls in the last 6 months. She hit her forehead when [place]. Has been worsening over the last 6 weeks. Has not had another episode since. Her bp was checked at the house after.   Eyes having decreased vision due to macular degeneration and diabetic changes. She had laser eye surgery in left eye and has not had any more injections.  Vision is improved, but not  100%.  Current Outpatient Medications on File Prior to Visit       02/18/2023   10:56 AM 10/22/2022    2:45 PM 07/16/2022    9:59 AM 01/09/2022   10:18 AM 11/09/2021   10:40 AM  Depression screen PHQ 2/9  Decreased Interest 0 0 0 0 0  Down, Depressed, Hopeless 0 0 0 0 0  PHQ - 2 Score 0 0 0 0 0  Altered sleeping 0      Tired, decreased energy 0      Change in appetite 0      Feeling bad or failure about yourself  0      Trouble concentrating 0      Moving slowly or fidgety/restless 0      Suicidal thoughts 0      PHQ-9 Score 0      Difficult doing work/chores Not difficult at all            02/18/2023   10:55 AM  Fall Risk   Falls in the past year? 1  Number falls in past yr: 0  Injury with Fall? 1  Risk for fall due to : No Fall Risks  Follow up Falls evaluation completed;Falls prevention discussed    Patient Care Team: Blane Ohara, MD as PCP - General (Internal Medicine) Baldo Daub, MD as Consulting Physician (Cardiology) Regan Lemming, MD  as Consulting Physician (Cardiology)   Review of Systems  Constitutional:  Negative for chills, fatigue and fever.  HENT:  Negative for congestion, rhinorrhea and sore throat.   Respiratory:  Positive for shortness of breath (doe). Negative for cough.   Cardiovascular:  Negative for chest pain.  Gastrointestinal:  Positive for constipation (otc dulcolax.). Negative for abdominal pain, diarrhea, nausea and vomiting.  Genitourinary:  Positive for dysuria. Negative for urgency.  Musculoskeletal:  Negative for back pain and myalgias.  Neurological:  Positive for syncope (syncopal episode x 3 6 weeks ago.) and headaches. Negative for dizziness, weakness and light-headedness.  Psychiatric/Behavioral:  Negative for dysphoric mood. The patient is not nervous/anxious.     Current Outpatient Medications on File Prior to Visit  Medication Sig Dispense Refill   albuterol (VENTOLIN HFA) 108 (90 Base) MCG/ACT inhaler INHALE 2  PUFFS INTO THE LUNGS EVERY 6 (SIX) HOURS AS NEEDED FOR WHEEZING OR SHORTNESS OF BREATH. 4 each 3   Alcohol Swabs (ALCOHOL WIPES) 70 % PADS USE ONE PAD TO CLEAN AREA TO CHECK BLOOD SUGAR AS  DIRECTED     allopurinol (ZYLOPRIM) 300 MG tablet Take 1 tablet (300 mg total) by mouth every morning. 90 tablet 1   Biotin 5000 MCG CAPS Take 5,000 mcg by mouth every morning.     Blood Glucose Monitoring Suppl (TRUE METRIX METER) DEVI Use to test blood sugars once daily. ICD: E11.65     calcium carbonate (OSCAL) 1500 (600 Ca) MG TABS tablet Take 600 mg by mouth 2 (two) times daily with a meal.     carvedilol (COREG) 6.25 MG tablet Take 1 tablet (6.25 mg total) by mouth daily. 90 tablet 3   clopidogrel (PLAVIX) 75 MG tablet TAKE 1 TABLET EVERY DAY (NEED TO SCHEDULE A FOLLOW UP VISIT) 90 tablet 3   colchicine 0.6 MG tablet Take 1 tablet (0.6 mg total) by mouth 2 (two) times daily as needed (gout flare up.). 30 tablet 1   Continuous Blood Gluc Receiver (FREESTYLE LIBRE READER) DEVI 1 each by Does not apply route as directed. 1 each 0   empagliflozin (JARDIANCE) 25 MG TABS tablet Take 1 tablet (25 mg total) by mouth daily before breakfast. 90 tablet 0   Evolocumab (REPATHA SURECLICK) 140 MG/ML SOAJ Inject 140 mg into the skin every 14 (fourteen) days. 2 mL 11   ezetimibe (ZETIA) 10 MG tablet Take 10 mg by mouth daily.     famotidine (PEPCID) 10 MG tablet Take 10 mg by mouth 2 (two) times daily as needed for heartburn or indigestion.      fluticasone (FLONASE) 50 MCG/ACT nasal spray USE 1 SPRAY NASALLY EVERY DAY     furosemide (LASIX) 80 MG tablet TAKE 1 TABLET EVERY DAY 90 tablet 3   gabapentin (NEURONTIN) 300 MG capsule Take 1 capsule (300 mg total) by mouth 3 (three) times daily as needed. 90 capsule 1   glucose blood (TRUE METRIX BLOOD GLUCOSE TEST) test strip Use as instructed 100 each 12   hydrALAZINE (APRESOLINE) 25 MG tablet Take 25 mg by mouth 2 (two) times daily.     insulin NPH-regular Human (NOVOLIN  70/30) (70-30) 100 UNIT/ML injection Inject 25-32 Units into the skin daily. Reports 20 units in morning and 10-12 at night if needed.     isosorbide mononitrate (IMDUR) 60 MG 24 hr tablet TAKE 1 TABLET EVERY DAY 90 tablet 3   levothyroxine (SYNTHROID, LEVOTHROID) 50 MCG tablet Take 50 mcg by mouth every morning.  methocarbamol (ROBAXIN) 500 MG tablet Take 1-2 tablets (500-1,000 mg total) by mouth every 6 (six) hours as needed for muscle spasms. 60 tablet 0   nitroGLYCERIN (NITROSTAT) 0.4 MG SL tablet DISSOLVE 1 TABLET UNDER THE TONGUE EVERY 5 MINUTES AS NEEDED FOR CHEST PAIN UP TO 3 TIMES. CALL EMS IF YOU NEED A SECOND. 25 tablet 11   pantoprazole (PROTONIX) 40 MG tablet TAKE 1 TABLET EVERY DAY 90 tablet 3   potassium chloride (KLOR-CON) 10 MEQ tablet Take 10 mEq by mouth as needed. To take one (1) tablet if you take extra lasix for weight gain.     ranolazine (RANEXA) 500 MG 12 hr tablet Take 1 tablet (500 mg total) by mouth 2 (two) times daily. 60 tablet 6   TRADJENTA 5 MG TABS tablet TAKE 1 TABLET EVERY DAY 90 tablet 3   traMADol (ULTRAM) 50 MG tablet Take 1 tablet (50 mg total) by mouth every 6 (six) hours as needed. 30 tablet 2   TRUEplus Lancets 33G MISC Use to test blood sugar once daily. ICD: E11.65     valACYclovir (VALTREX) 500 MG tablet Take 1 tablet (500 mg total) by mouth 3 (three) times daily. 21 tablet 0   No current facility-administered medications on file prior to visit.   Past Medical History:  Diagnosis Date   Anemia    a. Noted on labs 02/2014 possibly procedurally related.   Asthma    CAD S/P percutaneous coronary angioplasty 01/22/2014   A. (12/2013): POBA - OM2 99% (very tortuous segment) - reduced ~ 50% & TIMI 3 flow, 80-90% stenosis in mid PDA, Irregularities <50% in mRCA, pLAD and prox LCx; b. 02/17/14: Moderate sized fixed defect in Lateral wall --> cath with OM2 restenosis & otherwise stable --> Xience Alpine DES x 2 (2.25 mm x 12 & 8 mm). c. NSTEMI 02/2014 s/p  PTCA/balloon angioplasty only to small caliber PDA.   Carotid stenosis    Carotid US 11/17: L 1-39; FU prn   Chronic systolic heart failure (HCC)    a. ICM b. ECHO (01/2014): EF 25-30%, grade I DD, trivial MR. c. EF by Myoview 02/17/14 = ~53%. d. EF by cath 02/24/14 - improved to 50-55%.   CKD (chronic kidney disease), stage III (HCC)    Contrast media allergy    Environmental allergies    GERD (gastroesophageal reflux disease)    Gout, unspecified    HOH (hard of hearing)    Hyperlipidemia    a. H/o intolerance to Zocor, Lipitor. Had muscle aches on higher dose Crestor.   Hypertension    Hypothyroidism    LBBB (left bundle branch block)    a. Transient during 02/2014 admission.   NSTEMI (non-ST elevated myocardial infarction) (HCC) 2014; 12/2013, 01/2014   QT prolongation    a. Noted on EKG 02/2014.   Sinus bradycardia    a. HR 40s-50s during 02/2014 admission, limiting BB dose.   TIA (transient ischemic attack) 1990's   a. TIA vs stroke 1990's "mild" - sounds like a TIA as she was told she had stroke symptoms but negative scans. Denies residual effects.   Type II diabetes mellitus [facility])    Past Surgical History:  Procedure Laterality Date   ABDOMINAL HYSTERECTOMY     complete   AMPUTATION TOE Right 02/25/2021   hammer toe.   APPENDECTOMY  1959   CARDIAC CATHETERIZATION  02/24/2014   Procedure: CORONARY BALLOON ANGIOPLASTY;  Surgeon: Kathleene Hazel, MD;  Location: [facility] CATH LAB;  Service:  Cardiovascular;;   CARDIAC CATHETERIZATION N/A 04/20/2015   Procedure: Left Heart Cath and Coronary Angiography;  Surgeon: Lyn Records, MD;  Location[facility] INVASIVE CV LAB;  Service: Cardiovascular;  Laterality: N/A;   CATARACT EXTRACTION W/ INTRAOCULAR LENS  IMPLANT, BILATERAL Bilateral 2013   CESAREAN SECTION  1959; 1960   CHOLECYSTECTOMY  1989   CORONARY ANGIOPLASTY  12/2013   POBA - OM2   CORONARY ARTERY BYPASS GRAFT N/A 04/25/2015   Procedure: CORONARY ARTERY BYPASS GRAFTING (CABG) x  four,  using left internal mammary artery and right leg greater saphenous vein harvested endoscopically;  Surgeon: Alleen Borne, MD;  Location: [facility] OR;  Service: Open Heart Surgery;  Laterality: N/A;   JOINT REPLACEMENT Right 10 years ago    right knee (TKR)   LEFT HEART CATHETERIZATION WITH CORONARY ANGIOGRAM N/A 01/22/2014   Procedure: LEFT HEART CATHETERIZATION WITH CORONARY ANGIOGRAM;  Surgeon: Lesleigh Noe, MD;  Location: [facility] CATH LAB;  Service: Cardiovascular;  Laterality: N/A;   LEFT HEART CATHETERIZATION WITH CORONARY ANGIOGRAM N/A 02/17/2014   Procedure: LEFT HEART CATHETERIZATION WITH CORONARY ANGIOGRAM;  Surgeon: Kathleene Hazel, MD;  Location: [facility] CATH LAB;  Service: Cardiovascular;  Laterality: N/A;   LEFT HEART CATHETERIZATION WITH CORONARY ANGIOGRAM N/A 02/24/2014   Procedure: LEFT HEART CATHETERIZATION WITH CORONARY ANGIOGRAM;  Surgeon: Kathleene Hazel, MD;  Location: [facility] CATH LAB;  Service: Cardiovascular;  Laterality: N/A;   LUMBAR LAMINECTOMY/DECOMPRESSION MICRODISCECTOMY Left 12/22/2014   Procedure: CENTRAL DECOMPRESSIVE LUMBAR, AND LAMINECTOMY L5-S1,  S1-S2 FOR SPINAL STENOSIS FORAMINOTOMY L5, S1, S2 ROOTS ON LEFT FOR FORAMINAL STENOSIS, AND DECOMPRESSION L5-S1, S1-S2 ACCORDING TO LABELING OF [facility] X-RAYS;  Surgeon: Ranee Gosselin, MD;  Location: [facility] ORS;  Service: Orthopedics;  Laterality: Left;   PERCUTANEOUS CORONARY STENT INTERVENTION (PCI-S)  02/17/2014   For restenosis of OM2 - PCI with Xience Apline DES 2.25 mm x 12 mm & 2.25 mm x 8 mm overlapping   REDUCTION MAMMAPLASTY Bilateral 1990's   SHOULDER OPEN ROTATOR CUFF REPAIR Bilateral 1980's - 1990's   TEE WITHOUT CARDIOVERSION N/A 04/25/2015   Procedure: TRANSESOPHAGEAL ECHOCARDIOGRAM (TEE);  Surgeon: Alleen Borne, MD;  Location: [facility] OR;  Service: Open Heart Surgery;  Laterality: N/A;   TOTAL KNEE ARTHROPLASTY Right 2009   TOTAL KNEE ARTHROPLASTY Left 04/21/2018   TKR;  Surgeon: Dannielle Huh, MD;  Location: [facility] ORS;  Service: Orthopedics;  Laterality: Left;  Adductor Block    Family History  Problem Relation Age of Onset   Hypertension Mother    Diabetes Mother    Heart attack Mother 28   Heart disease Father    Emphysema Father    Diabetes Brother    Heart disease Brother    Cancer Sister    Stroke Neg Hx    Social History   Socioeconomic History   Marital status: Widowed    Spouse name: Not on file   Number of children: 2   Years of education: Not on file   Highest education level: Not on file  Occupational History   Occupation: Retired  Tobacco Use   Smoking status: Never   Smokeless tobacco: Never  Vaping Use   Vaping status: Never Used  Substance and Sexual Activity   Alcohol use: No   Drug use: No   Sexual activity: Never  Other Topics Concern   Not on file  Social History Narrative   Lives in [place] by herself.    Social Determinants of Health   Financial  Resource Strain: Low Risk  (10/22/2022)   Overall Financial Resource Strain (CARDIA)    Difficulty of Paying Living Expenses: Not hard at all  Food Insecurity: No Food Insecurity (10/22/2022)   Hunger Vital Sign    Worried About Running Out of Food in the Last Year: Never true    Ran Out of Food in the Last Year: Never true  Transportation Needs: No Transportation Needs (10/22/2022)   PRAPARE - Administrator, Civil Service (Medical): No    Lack of Transportation (Non-Medical): No  Physical Activity: Sufficiently Active (10/22/2022)   Exercise Vital Sign    Days of Exercise per Week: 6 days    Minutes of Exercise per Session: 40 min  Stress: No Stress Concern Present (10/22/2022)   [employer] of Occupational Health - Occupational Stress Questionnaire    Feeling of Stress : Not at all  Social Connections: Moderately Isolated (10/22/2022)   Social Connection and Isolation Panel [NHANES]    Frequency of Communication with Friends and Family: Three times a week     Frequency of Social Gatherings with Friends and Family: Three times a week    Attends Religious Services: More than 4 times per year    Active Member of Clubs or Organizations: No    Attends Banker Meetings: Never    Marital Status: Widowed    Objective:  BP (!) 140/68   Pulse 78   Temp (!) 96.9 F (36.1 C)   Resp 16   Ht 5\' 7"  (1.702 m)   Wt 190 lb 3.2 oz (86.3 kg)   BMI 29.79 kg/m      02/18/2023   10:52 AM 12/11/2022    9:00 AM 10/22/2022    2:36 PM  BP/Weight  Systolic BP 140 138 106  Diastolic BP 68 70 62  Wt. (Lbs) 190.2 197 197  BMI 29.79 kg/m2 30.85 kg/m2 30.4 kg/m2    Physical Exam  Diabetic Foot Exam - Simple   Simple Foot Form Diabetic Foot exam was performed with the following findings: Yes 02/18/2023 11:08 AM  Visual Inspection See comments: Yes Sensation Testing Intact to touch and monofilament testing bilaterally: Yes Pulse Check Posterior Tibialis and Dorsalis pulse intact bilaterally: Yes Comments Bunion right foot. Amputaion 2nd toe.        Lab Results  Component Value Date   WBC 7.4 10/23/2022   HGB 12.2 10/23/2022   HCT 36.5 10/23/2022   PLT 234 10/23/2022   GLUCOSE 89 10/23/2022   CHOL 104 10/23/2022   TRIG 89 10/23/2022   HDL 48 10/23/2022   LDLCALC 39 10/23/2022   ALT 12 10/23/2022   AST 17 10/23/2022   NA 142 10/23/2022   K 4.8 10/23/2022   CL 100 10/23/2022   CREATININE 1.95 (H) 10/23/2022   BUN 37 (H) 10/23/2022   CO2 25 10/23/2022   TSH 2.580 04/16/2022   INR 0.94 04/21/2018   HGBA1C 6.5 (H) 10/23/2022   MICROALBUR 2.1 (H) 07/26/2014      Assessment & Plan:    Acquired hypothyroidism  Mixed hyperlipidemia  CAD in native artery  History of non-ST elevation myocardial infarction (NSTEMI)  Heart failure with improved ejection fraction (HFimpEF) (HCC)  Stage 3b chronic kidney disease (HCC)  Controlled type 2 diabetes mellitus with diabetic neuropathy, with long-term current use of insulin  (HCC)  Gastroesophageal reflux disease without esophagitis  Acute cystitis without hematuria -     POCT URINALYSIS DIP (CLINITEK) -     Urine  Culture     No orders of the defined types were placed in this encounter.   Orders Placed This Encounter  Procedures   Urine Culture   POCT URINALYSIS DIP (CLINITEK)     Follow-up: No follow-ups on file.   I,Carolyn M Morrison,acting as a Neurosurgeon for Blane Ohara, MD.,have documented all relevant documentation on the behalf of Blane Ohara, MD,as directed by  Blane Ohara, MD while in the presence of Blane Ohara, MD.   An After Visit Summary was printed and given to the patient.  Blane Ohara, MD [facility] [phone]

## 2023-02-18 NOTE — Patient Instructions (Signed)
Discontinue tramadol.  Decrease IMDUR 30 mg once daily.

## 2023-02-19 LAB — CBC WITH DIFFERENTIAL/PLATELET
Basophils Absolute: 0.1 10*3/uL (ref 0.0–0.2)
Basos: 1 %
EOS (ABSOLUTE): 0 10*3/uL (ref 0.0–0.4)
Eos: 1 %
Hematocrit: 38.3 % (ref 34.0–46.6)
Hemoglobin: 13.1 g/dL (ref 11.1–15.9)
Immature Grans (Abs): 0 10*3/uL (ref 0.0–0.1)
Immature Granulocytes: 0 %
Lymphocytes Absolute: 2 10*3/uL (ref 0.7–3.1)
Lymphs: 24 %
MCH: 31.6 pg (ref 26.6–33.0)
MCHC: 34.2 g/dL (ref 31.5–35.7)
MCV: 93 fL (ref 79–97)
Monocytes Absolute: 0.7 10*3/uL (ref 0.1–0.9)
Monocytes: 8 %
Neutrophils Absolute: 5.3 10*3/uL (ref 1.4–7.0)
Neutrophils: 66 %
Platelets: 215 10*3/uL (ref 150–450)
RBC: 4.14 x10E6/uL (ref 3.77–5.28)
RDW: 14.3 % (ref 11.7–15.4)
WBC: 8.1 10*3/uL (ref 3.4–10.8)

## 2023-02-19 LAB — COMPREHENSIVE METABOLIC PANEL
ALT: 12 IU/L (ref 0–32)
AST: 19 IU/L (ref 0–40)
Albumin: 4.3 g/dL (ref 3.7–4.7)
Alkaline Phosphatase: 131 IU/L — ABNORMAL HIGH (ref 44–121)
BUN/Creatinine Ratio: 22 (ref 12–28)
BUN: 37 mg/dL — ABNORMAL HIGH (ref 8–27)
Bilirubin Total: 0.5 mg/dL (ref 0.0–1.2)
CO2: 25 mmol/L (ref 20–29)
Calcium: 9.5 mg/dL (ref 8.7–10.3)
Chloride: 101 mmol/L (ref 96–106)
Creatinine, Ser: 1.65 mg/dL — ABNORMAL HIGH (ref 0.57–1.00)
Globulin, Total: 2.4 g/dL (ref 1.5–4.5)
Glucose: 134 mg/dL — ABNORMAL HIGH (ref 70–99)
Potassium: 4.8 mmol/L (ref 3.5–5.2)
Sodium: 141 mmol/L (ref 134–144)
Total Protein: 6.7 g/dL (ref 6.0–8.5)
eGFR: 30 mL/min/{1.73_m2} — ABNORMAL LOW (ref >59)

## 2023-02-19 LAB — LITHOLINK CKD PROGRAM

## 2023-02-19 LAB — LIPID PANEL
Chol/HDL Ratio: 3.9 ratio (ref 0.0–4.4)
Cholesterol, Total: 188 mg/dL (ref 100–199)
HDL: 48 mg/dL (ref >39)
LDL Chol Calc (NIH): 115 mg/dL — ABNORMAL HIGH (ref 0–99)
Triglycerides: 141 mg/dL (ref 0–149)
VLDL Cholesterol Cal: 25 mg/dL (ref 5–40)

## 2023-02-19 LAB — HEMOGLOBIN A1C
Est. average glucose Bld gHb Est-mCnc: 146 mg/dL
Hgb A1c MFr Bld: 6.7 % — ABNORMAL HIGH (ref 4.8–5.6)

## 2023-02-19 LAB — TSH: TSH: 2.75 u[IU]/mL (ref 0.450–4.500)

## 2023-02-19 LAB — MICROALBUMIN / CREATININE URINE RATIO
Creatinine, Urine: 48.6 mg/dL
Microalb/Creat Ratio: 166 mg/g creat — ABNORMAL HIGH (ref 0–29)
Microalbumin, Urine: 80.7 ug/mL

## 2023-02-20 ENCOUNTER — Telehealth: Payer: Self-pay

## 2023-02-20 ENCOUNTER — Other Ambulatory Visit: Payer: Self-pay

## 2023-02-20 ENCOUNTER — Encounter: Payer: Self-pay | Admitting: Family Medicine

## 2023-02-20 DIAGNOSIS — N3000 Acute cystitis without hematuria: Secondary | ICD-10-CM | POA: Insufficient documentation

## 2023-02-20 LAB — URINE CULTURE

## 2023-02-20 NOTE — Assessment & Plan Note (Signed)
The current medical regimen is effective;  continue present plan and medications. Continue pantoprazole 40 mg  daily and pepcid 10 mg 2 times daily as needed.  

## 2023-02-20 NOTE — Assessment & Plan Note (Signed)
Start on bactrim ds

## 2023-02-20 NOTE — Assessment & Plan Note (Signed)
Previously well controlled Continue Synthroid at current dose  Recheck TSH and adjust Synthroid as indicated   

## 2023-02-20 NOTE — Progress Notes (Signed)
   02/20/2023  Patient ID: Carol [name], female   DOB: 12/08/1935, 87 y.o.   MRN: [id number]  Clinic routed request from CMA that Dr. Sedalia Muta would like patient to increase to Jardiance 25mg  daily, which she receives through [facility] PAP program.  Pending a prescription  go to [facility], which is the pharmacy that does the processing for [facility] PAPs.  I will contact [facility] to verify order is received and processing next week.  Lenna Gilford, PharmD, DPLA

## 2023-02-20 NOTE — Assessment & Plan Note (Signed)
Stable

## 2023-02-20 NOTE — Assessment & Plan Note (Signed)
Well controlled.  Statin intolerant.  Continue zetia and repatha. Labs ordered.

## 2023-02-20 NOTE — Assessment & Plan Note (Signed)
Stable.  On imdur, plavix, repatha, carvedilol 6.25 mg twice daily, hydralazine 25 mg twice daily, and ranexa 500 mg twice daily.

## 2023-02-20 NOTE — Assessment & Plan Note (Signed)
Control: improved. At goal. Glomerulopathy also. Recommend: CGM Recommend check feet daily. Recommend annual eye exams. Medicines: Increase jardiance 25 mg daily. Continue Novolin 70/30 to 25 U before breakfast and tradjenta 5 mg daily. Continue to work on eating a healthy diet and exercise.  Labs drawn today.

## 2023-02-20 NOTE — Assessment & Plan Note (Signed)
Discontinue tramadol.  Decrease IMDUR 30 mg once daily.

## 2023-02-20 NOTE — Assessment & Plan Note (Signed)
Ischemic cardiomyopathy.  EF was as low as 25-30.  Most recent echocardiogram in July 2023 with normal EF.  Volume status appears stable.  As she does have orthostatic hypotension, I have recommended that we decrease her furosemide to 40 mg daily and change potassium to as needed.  She knows to weigh herself daily.  If she gains 3 or more pounds in 1 day, she can take extra furosemide 40 mg along with K+ 10 mEq.  Continue carvedilol 6.25 mg daily, Jardiance 25 mg daily, hydralazine 25 mg twice daily, isosorbide mononitrate 60 mg daily.

## 2023-02-21 ENCOUNTER — Other Ambulatory Visit: Payer: Self-pay

## 2023-02-22 ENCOUNTER — Other Ambulatory Visit: Payer: Self-pay

## 2023-02-22 MED ORDER — EMPAGLIFLOZIN 25 MG PO TABS: 25.0000 mg | ORAL_TABLET | Freq: Every day | ORAL | 3 refills | Status: DC

## 2023-02-26 ENCOUNTER — Other Ambulatory Visit: Payer: Self-pay | Admitting: Family Medicine

## 2023-02-26 NOTE — Telephone Encounter (Signed)
Sent. Dr. Cox  

## 2023-02-28 DIAGNOSIS — E113592 Type 2 diabetes mellitus with proliferative diabetic retinopathy without macular edema, left eye: Secondary | ICD-10-CM | POA: Diagnosis not present

## 2023-02-28 LAB — HM DIABETES EYE EXAM

## 2023-03-01 ENCOUNTER — Other Ambulatory Visit: Payer: Self-pay

## 2023-03-01 MED ORDER — EZETIMIBE 10 MG PO TABS: 10.0000 mg | ORAL_TABLET | Freq: Every day | ORAL | 0 refills | Status: DC

## 2023-03-04 ENCOUNTER — Encounter: Payer: Self-pay | Admitting: Family Medicine

## 2023-03-04 ENCOUNTER — Ambulatory Visit (INDEPENDENT_AMBULATORY_CARE_PROVIDER_SITE_OTHER): Payer: Medicare HMO | Admitting: Family Medicine

## 2023-03-04 VITALS — BP 160/72 | HR 74 | Temp 97.6°F | Ht 67.5 in | Wt 191.0 lb

## 2023-03-04 DIAGNOSIS — N1832 Chronic kidney disease, stage 3b: Secondary | ICD-10-CM | POA: Diagnosis not present

## 2023-03-04 DIAGNOSIS — I509 Heart failure, unspecified: Secondary | ICD-10-CM

## 2023-03-04 DIAGNOSIS — I130 Hypertensive heart and chronic kidney disease with heart failure and stage 1 through stage 4 chronic kidney disease, or unspecified chronic kidney disease: Secondary | ICD-10-CM

## 2023-03-04 MED ORDER — CARVEDILOL 6.25 MG PO TABS
6.2500 mg | ORAL_TABLET | Freq: Two times a day (BID) | ORAL | 3 refills | Status: DC
Start: 2023-03-04 — End: 2023-06-05

## 2023-03-04 NOTE — Assessment & Plan Note (Signed)
BP elevated today.  Increase Coreg 6.25 mg by mouth TWICE A DAY  Gave patient a BP log to write her BP readings at home Recommend continue working on diet and exercise

## 2023-03-04 NOTE — Patient Instructions (Signed)
Take coreg 6.25 mg  by mouth TWICE A DAY

## 2023-03-04 NOTE — Progress Notes (Signed)
Subjective:  Patient ID: Carol [name], female    DOB: [birth year]  Age: 87 y.o. MRN: [id number]  Chief Complaint  Patient presents with   Hypertension   HPI:   CORONARY ARTERY DISEASE/CONGESTIVE HEART FAILURE/hypertension/CKD stage 3b: Stents. 3 heart attacks. CABG. MCHG: Dr. Katrinka Blazing. On zetia 10 mg daily, plavix 75 mg daily, Imdur 30 mg daily, Carvedilol 6.25 mg one twice daily, and lasix 80 mg prn. Patient saw Dr. Katrinka Blazing. Echo 7/20. NTG SL has with her. Patient states that she feels much better than she did 2 weeks ago. She states that her BP readings have been elevated at home. She states that she hasn't received her medications by mail yet, but she did get a notice that they are in process. She admits to eating a sweet potato last night. She reports that she does not recall how high her BP readings have been because she did not write them down.  She also has paperwork for her disability placard to be filled out.    Ischemic cardiomyopathy.  EF was as low as 25-30.  Most recent echocardiogram in July 2023 with normal EF.  Volume status appears stable.  As she does have orthostatic hypotension, I have recommended that we decrease her furosemide to 40 mg daily and change potassium to as needed.  She knows to weigh herself daily.  If she gains 3 or more pounds in 1 day, she can take extra furosemide 40 mg along with K+ 10 mEq.  Continue carvedilol 6.25 mg daily, Jardiance 25 mg daily, hydralazine 25 mg twice daily, isosorbide mononitrate 60 mg daily.         02/18/2023   10:56 AM 10/22/2022    2:45 PM 07/16/2022    9:59 AM 01/09/2022   10:18 AM 11/09/2021   10:40 AM  Depression screen PHQ 2/9  Decreased Interest 0 0 0 0 0  Down, Depressed, Hopeless 0 0 0 0 0  PHQ - 2 Score 0 0 0 0 0  Altered sleeping 0      Tired, decreased energy 0      Change in appetite 0      Feeling bad or failure about yourself  0      Trouble concentrating 0      Moving slowly or fidgety/restless 0      Suicidal  thoughts 0      PHQ-9 Score 0      Difficult doing work/chores Not difficult at all            02/18/2023   10:55 AM  Fall Risk   Falls in the past year? 1  Number falls in past yr: 0  Injury with Fall? 1  Risk for fall due to : No Fall Risks  Follow up Falls evaluation completed;Falls prevention discussed    Patient Care Team: Blane Ohara, MD as PCP - General (Internal Medicine) Baldo Daub, MD as Consulting Physician (Cardiology) Regan Lemming, MD as Consulting Physician (Cardiology) [facility] [place]   Review of Systems  Constitutional:  Negative for chills, fatigue and fever.  HENT:  Negative for congestion, ear pain, rhinorrhea and sore throat.   Respiratory:  Negative for cough and shortness of breath.   Cardiovascular:  Negative for chest pain.  Gastrointestinal:  Negative for abdominal pain, constipation, diarrhea, nausea and vomiting.  Genitourinary:  Negative for dysuria and urgency.  Musculoskeletal:  Negative for back pain and myalgias.  Neurological:  Negative for dizziness, weakness, light-headedness and headaches.  Psychiatric/Behavioral:  Negative for dysphoric mood. The patient is not nervous/anxious.     Current Outpatient Medications on File Prior to Visit  Medication Sig Dispense Refill   albuterol (VENTOLIN HFA) 108 (90 Base) MCG/ACT inhaler INHALE 2 PUFFS INTO THE LUNGS EVERY 6 (SIX) HOURS AS NEEDED FOR WHEEZING OR SHORTNESS OF BREATH. 4 each 3   Alcohol Swabs (ALCOHOL WIPES) 70 % PADS USE ONE PAD TO CLEAN AREA TO CHECK BLOOD SUGAR AS  DIRECTED     allopurinol (ZYLOPRIM) 300 MG tablet Take 1 tablet (300 mg total) by mouth every morning. 90 tablet 1   Biotin 5000 MCG CAPS Take 5,000 mcg by mouth every morning.     Blood Glucose Monitoring Suppl (TRUE METRIX METER) DEVI Use to test blood sugars once daily. ICD: E11.65     calcium carbonate (OSCAL) 1500 (600 Ca) MG TABS tablet Take 600 mg by mouth 2 (two) times daily with a meal.      carvedilol (COREG) 6.25 MG tablet Take 1 tablet (6.25 mg total) by mouth daily. 90 tablet 1   clopidogrel (PLAVIX) 75 MG tablet TAKE 1 TABLET EVERY DAY (NEED TO SCHEDULE A FOLLOW UP VISIT) 90 tablet 3   colchicine 0.6 MG tablet Take 1 tablet (0.6 mg total) by mouth 2 (two) times daily as needed (gout flare up.). 30 tablet 1   Continuous Blood Gluc Receiver (FREESTYLE LIBRE READER) DEVI 1 each by Does not apply route as directed. 1 each 0   empagliflozin (JARDIANCE) 25 MG TABS tablet Take 1 tablet (25 mg total) by mouth daily before breakfast. 90 tablet 3   Evolocumab (REPATHA SURECLICK) 140 MG/ML SOAJ Inject 140 mg into the skin every 14 (fourteen) days. 2 mL 11   ezetimibe (ZETIA) 10 MG tablet Take 1 tablet (10 mg total) by mouth daily. 90 tablet 0   famotidine (PEPCID) 10 MG tablet Take 10 mg by mouth 2 (two) times daily as needed for heartburn or indigestion.      fluticasone (FLONASE) 50 MCG/ACT nasal spray USE 1 SPRAY NASALLY EVERY DAY     furosemide (LASIX) 80 MG tablet TAKE 1 TABLET EVERY DAY (Patient taking differently: Take 80 mg by mouth as needed.) 90 tablet 3   gabapentin (NEURONTIN) 300 MG capsule Take 1 capsule (300 mg total) by mouth 3 (three) times daily as needed. 90 capsule 1   glucose blood (TRUE METRIX BLOOD GLUCOSE TEST) test strip Use as instructed 100 each 12   insulin NPH-regular Human (NOVOLIN 70/30) (70-30) 100 UNIT/ML injection Inject 25-32 Units into the skin daily. Reports 20 units in morning and 10-12 at night if needed.     isosorbide mononitrate (IMDUR) 30 MG 24 hr tablet Take 1 tablet (30 mg total) by mouth daily. 30 tablet 0   levothyroxine (SYNTHROID, LEVOTHROID) 50 MCG tablet Take 50 mcg by mouth every morning.      methocarbamol (ROBAXIN) 500 MG tablet Take 1-2 tablets (500-1,000 mg total) by mouth every 6 (six) hours as needed for muscle spasms. 60 tablet 0   nitroGLYCERIN (NITROSTAT) 0.4 MG SL tablet DISSOLVE 1 TABLET UNDER THE TONGUE EVERY 5 MINUTES AS NEEDED  FOR CHEST PAIN UP TO 3 TIMES. CALL EMS IF YOU NEED A SECOND. 25 tablet 11   pantoprazole (PROTONIX) 40 MG tablet TAKE 1 TABLET EVERY DAY 90 tablet 3   potassium chloride (KLOR-CON) 10 MEQ tablet Take 10 mEq by mouth as needed. To take one (1) tablet if you take extra lasix for weight  gain.     ranolazine (RANEXA) 500 MG 12 hr tablet Take 1 tablet (500 mg total) by mouth 2 (two) times daily. 180 tablet 0   sulfamethoxazole-trimethoprim (BACTRIM DS) 800-160 MG tablet Take 1 tablet by mouth 2 (two) times daily. 10 tablet 0   TRADJENTA 5 MG TABS tablet TAKE 1 TABLET EVERY DAY 90 tablet 3   TRUEplus Lancets 33G MISC Use to test blood sugar once daily. ICD: E11.65     valACYclovir (VALTREX) 500 MG tablet Take 1 tablet (500 mg total) by mouth 3 (three) times daily. 21 tablet 0   No current facility-administered medications on file prior to visit.   Past Medical History:  Diagnosis Date   Anemia    a. Noted on labs 02/2014 possibly procedurally related.   Asthma    CAD S/P percutaneous coronary angioplasty 01/22/2014   A. (12/2013): POBA - OM2 99% (very tortuous segment) - reduced ~ 50% & TIMI 3 flow, 80-90% stenosis in mid PDA, Irregularities <50% in mRCA, pLAD and prox LCx; b. 02/17/14: Moderate sized fixed defect in Lateral wall --> cath with OM2 restenosis & otherwise stable --> Xience Alpine DES x 2 (2.25 mm x 12 & 8 mm). c. NSTEMI 02/2014 s/p PTCA/balloon angioplasty only to small caliber PDA.   Carotid stenosis    Carotid US 11/17: L 1-39; FU prn   Chronic systolic heart failure (HCC)    a. ICM b. ECHO (01/2014): EF 25-30%, grade I DD, trivial MR. c. EF by Myoview 02/17/14 = ~53%. d. EF by cath 02/24/14 - improved to 50-55%.   CKD (chronic kidney disease), stage III (HCC)    Contrast media allergy    Environmental allergies    GERD (gastroesophageal reflux disease)    Gout, unspecified    HOH (hard of hearing)    Hyperlipidemia    a. H/o intolerance to Zocor, Lipitor. Had muscle aches on higher  dose Crestor.   Hypertension    Hypothyroidism    LBBB (left bundle branch block)    a. Transient during 02/2014 admission.   NSTEMI (non-ST elevated myocardial infarction) (HCC) 2014; 12/2013, 01/2014   QT prolongation    a. Noted on EKG 02/2014.   Sinus bradycardia    a. HR 40s-50s during 02/2014 admission, limiting BB dose.   TIA (transient ischemic attack) 1990's   a. TIA vs stroke 1990's "mild" - sounds like a TIA as she was told she had stroke symptoms but negative scans. Denies residual effects.   Type II diabetes mellitus [facility])    Past Surgical History:  Procedure Laterality Date   ABDOMINAL HYSTERECTOMY     complete   AMPUTATION TOE Right 02/25/2021   hammer toe.   APPENDECTOMY  1959   CARDIAC CATHETERIZATION  02/24/2014   Procedure: CORONARY BALLOON ANGIOPLASTY;  Surgeon: Kathleene Hazel, MD;  Location: [facility] CATH LAB;  Service: Cardiovascular;;   CARDIAC CATHETERIZATION N/A 04/20/2015   Procedure: Left Heart Cath and Coronary Angiography;  Surgeon: Lyn Records, MD;  Location: [facility] INVASIVE CV LAB;  Service: Cardiovascular;  Laterality: N/A;   CATARACT EXTRACTION W/ INTRAOCULAR LENS  IMPLANT, BILATERAL Bilateral 2013   CESAREAN SECTION  1959; 1960   CHOLECYSTECTOMY  1989   CORONARY ANGIOPLASTY  12/2013   POBA - OM2   CORONARY ARTERY BYPASS GRAFT N/A 04/25/2015   Procedure: CORONARY ARTERY BYPASS GRAFTING (CABG) x four,  using left internal mammary artery and right leg greater saphenous vein harvested endoscopically;  Surgeon: Alleen Borne, MD;  Location: [facility] OR;  Service: Open Heart Surgery;  Laterality: N/A;   JOINT REPLACEMENT Right 10 years ago    right knee (TKR)   LEFT HEART CATHETERIZATION WITH CORONARY ANGIOGRAM N/A 01/22/2014   Procedure: LEFT HEART CATHETERIZATION WITH CORONARY ANGIOGRAM;  Surgeon: Lesleigh Noe, MD;  Location: [facility] CATH LAB;  Service: Cardiovascular;  Laterality: N/A;   LEFT HEART CATHETERIZATION WITH CORONARY ANGIOGRAM N/A 02/17/2014    Procedure: LEFT HEART CATHETERIZATION WITH CORONARY ANGIOGRAM;  Surgeon: Kathleene Hazel, MD;  Location: [facility] CATH LAB;  Service: Cardiovascular;  Laterality: N/A;   LEFT HEART CATHETERIZATION WITH CORONARY ANGIOGRAM N/A 02/24/2014   Procedure: LEFT HEART CATHETERIZATION WITH CORONARY ANGIOGRAM;  Surgeon: Kathleene Hazel, MD;  Location: [facility] CATH LAB;  Service: Cardiovascular;  Laterality: N/A;   LUMBAR LAMINECTOMY/DECOMPRESSION MICRODISCECTOMY Left 12/22/2014   Procedure: CENTRAL DECOMPRESSIVE LUMBAR, AND LAMINECTOMY L5-S1,  S1-S2 FOR SPINAL STENOSIS FORAMINOTOMY L5, S1, S2 ROOTS ON LEFT FOR FORAMINAL STENOSIS, AND DECOMPRESSION L5-S1, S1-S2 ACCORDING TO LABELING OF [facility] X-RAYS;  Surgeon: Ranee Gosselin, MD;  Location: [facility] ORS;  Service: Orthopedics;  Laterality: Left;   PERCUTANEOUS CORONARY STENT INTERVENTION (PCI-S)  02/17/2014   For restenosis of OM2 - PCI with Xience Apline DES 2.25 mm x 12 mm & 2.25 mm x 8 mm overlapping   REDUCTION MAMMAPLASTY Bilateral 1990's   SHOULDER OPEN ROTATOR CUFF REPAIR Bilateral 1980's - 1990's   TEE WITHOUT CARDIOVERSION N/A 04/25/2015   Procedure: TRANSESOPHAGEAL ECHOCARDIOGRAM (TEE);  Surgeon: Alleen Borne, MD;  Location[facility] OR;  Service: Open Heart Surgery;  Laterality: N/A;   TOTAL KNEE ARTHROPLASTY Right 2009   TOTAL KNEE ARTHROPLASTY Left 04/21/2018   TKR;  Surgeon: Dannielle Huh, MD;  Location: [facility] ORS;  Service: Orthopedics;  Laterality: Left;  Adductor Block    Family History  Problem Relation Age of Onset   Hypertension Mother    Diabetes Mother    Heart attack Mother 41   Heart disease Father    Emphysema Father    Diabetes Brother    Heart disease Brother    Cancer Sister    Stroke Neg Hx    Social History   Socioeconomic History   Marital status: Widowed    Spouse name: Not on file   Number of children: 2   Years of education: Not on file   Highest education level: Not on file  Occupational History    Occupation: Retired  Tobacco Use   Smoking status: Never   Smokeless tobacco: Never  Vaping Use   Vaping status: Never Used  Substance and Sexual Activity   Alcohol use: No   Drug use: No   Sexual activity: Never  Other Topics Concern   Not on file  Social History Narrative   Lives in [place] by herself.    Social Determinants of Health   Financial Resource Strain: Low Risk  (10/22/2022)   Overall Financial Resource Strain (CARDIA)    Difficulty of Paying Living Expenses: Not hard at all  Food Insecurity: No Food Insecurity (10/22/2022)   Hunger Vital Sign    Worried About Running Out of Food in the Last Year: Never true    Ran Out of Food in the Last Year: Never true  Transportation Needs: No Transportation Needs (10/22/2022)   PRAPARE - Administrator, Civil Service (Medical): No    Lack of Transportation (Non-Medical): No  Physical Activity: Sufficiently Active (10/22/2022)   Exercise Vital Sign    Days  of Exercise per Week: 6 days    Minutes of Exercise per Session: 40 min  Stress: No Stress Concern Present (10/22/2022)   [employer] of Occupational Health - Occupational Stress Questionnaire    Feeling of Stress : Not at all  Social Connections: Moderately Isolated (10/22/2022)   Social Connection and Isolation Panel [NHANES]    Frequency of Communication with Friends and Family: Three times a week    Frequency of Social Gatherings with Friends and Family: Three times a week    Attends Religious Services: More than 4 times per year    Active Member of Clubs or Organizations: No    Attends Banker Meetings: Never    Marital Status: Widowed    Objective:  BP (!) 160/72 (BP Location: Right Arm, Patient Position: Sitting)   Pulse 74   Temp 97.6 F (36.4 C)   Ht 5' 7.5" (1.715 m)   Wt 191 lb (86.6 kg)   SpO2 96%   BMI 29.47 kg/m      03/04/2023    9:22 AM 03/04/2023    8:56 AM 02/18/2023   10:52 AM  BP/Weight  Systolic BP 160 152 140   Diastolic BP 72 74 68  Wt. (Lbs)  191 190.2  BMI  29.47 kg/m2 29.79 kg/m2    Physical Exam Constitutional:      General: She is not in acute distress. HENT:     Head: Normocephalic.  Cardiovascular:     Rate and Rhythm: Normal rate and regular rhythm.  Pulmonary:     Effort: Pulmonary effort is normal.     Breath sounds: Normal breath sounds.  Skin:    General: Skin is warm.  Neurological:     Mental Status: She is alert.  Psychiatric:        Mood and Affect: Mood normal.       Lab Results  Component Value Date   WBC 8.1 02/18/2023   HGB 13.1 02/18/2023   HCT 38.3 02/18/2023   PLT 215 02/18/2023   GLUCOSE 134 (H) 02/18/2023   CHOL 188 02/18/2023   TRIG 141 02/18/2023   HDL 48 02/18/2023   LDLCALC 115 (H) 02/18/2023   ALT 12 02/18/2023   AST 19 02/18/2023   NA 141 02/18/2023   K 4.8 02/18/2023   CL 101 02/18/2023   CREATININE 1.65 (H) 02/18/2023   BUN 37 (H) 02/18/2023   CO2 25 02/18/2023   TSH 2.750 02/18/2023   INR 0.94 04/21/2018   HGBA1C 6.7 (H) 02/18/2023   MICROALBUR 2.1 (H) 07/26/2014      Assessment & Plan:    Hypertensive heart and renal disease with renal failure, stage 1 through stage 4 or unspecified chronic kidney disease, with heart failure (HCC) Assessment & Plan: BP elevated today.  Increase Coreg 6.25 mg by mouth TWICE A DAY  Gave patient a BP log to write her BP readings at home Recommend continue working on diet and exercise    Orders: -     Carvedilol; Take 1 tablet (6.25 mg total) by mouth 2 (two) times daily with a meal.  Dispense: 60 tablet; Refill: 3     Meds ordered this encounter  Medications   carvedilol (COREG) 6.25 MG tablet    Sig: Take 1 tablet (6.25 mg total) by mouth 2 (two) times daily with a meal.    Dispense:  60 tablet    Refill:  3      Follow-up: No follow-ups on file.  I,Katherina A Bramblett,acting as a scribe for Renne Crigler, FNP.,have documented all relevant documentation on the behalf of  Renne Crigler, FNP,as directed by  Renne Crigler, FNP while in the presence of Renne Crigler, FNP.   An After Visit Summary was printed and given to the patient.  Total time spent on today's visit was greater than 30 minutes, including both face-to-face time and nonface-to-face time personally spent on review of chart (labs and imaging), discussing labs and goals, discussing further work-up, treatment options, referrals to specialist if needed, reviewing outside records of pertinent, answering patient's questions, and coordinating care.   Renne Crigler, FNP [facility] [phone]

## 2023-03-05 DIAGNOSIS — E1122 Type 2 diabetes mellitus with diabetic chronic kidney disease: Secondary | ICD-10-CM | POA: Diagnosis not present

## 2023-03-21 ENCOUNTER — Other Ambulatory Visit: Payer: Self-pay | Admitting: Family Medicine

## 2023-03-21 DIAGNOSIS — I2510 Atherosclerotic heart disease of native coronary artery without angina pectoris: Secondary | ICD-10-CM

## 2023-03-29 ENCOUNTER — Ambulatory Visit: Payer: Medicare HMO | Attending: Internal Medicine | Admitting: Internal Medicine

## 2023-03-29 ENCOUNTER — Encounter: Payer: Self-pay | Admitting: Internal Medicine

## 2023-03-29 VITALS — BP 148/60 | HR 56 | Ht 67.5 in | Wt 194.0 lb

## 2023-03-29 DIAGNOSIS — I6523 Occlusion and stenosis of bilateral carotid arteries: Secondary | ICD-10-CM

## 2023-03-29 DIAGNOSIS — E782 Mixed hyperlipidemia: Secondary | ICD-10-CM | POA: Diagnosis not present

## 2023-03-29 DIAGNOSIS — I10 Essential (primary) hypertension: Secondary | ICD-10-CM

## 2023-03-29 DIAGNOSIS — I2510 Atherosclerotic heart disease of native coronary artery without angina pectoris: Secondary | ICD-10-CM

## 2023-03-29 DIAGNOSIS — G459 Transient cerebral ischemic attack, unspecified: Secondary | ICD-10-CM | POA: Diagnosis not present

## 2023-03-29 DIAGNOSIS — I255 Ischemic cardiomyopathy: Secondary | ICD-10-CM

## 2023-03-29 MED ORDER — ISOSORBIDE MONONITRATE ER 30 MG PO TB24: 15.0000 mg | ORAL_TABLET | Freq: Every day | ORAL | 3 refills | Status: DC

## 2023-03-29 NOTE — Progress Notes (Signed)
Cardiology Office Note:  .    Date:  03/29/2023  ID:  Carol [name], DOB 1935-12-29, MRN [id number] PCP: Blane Ohara, MD  [facility] Providers Cardiologist:  None     CC: Transition to new cardiologist  History of Present Illness: .    Carol [name] is a 87 y.o. female CAD s/p CABG who presents after syncopal episode related to low blood pressure followed by labile HTN off her Imdur. Discussed the use of AI scribe software for clinical note transcription with the patient, who gave verbal consent to proceed. 2-24: Saw Scott for [employer].  No ischemia on NM stress test.  Saw HP with improvement in black out spetls.  Patient notes that she is doing fair.   Since last visit notes one episode of hypotension related syncope at a [place] in [place] . No ED admission.  PCP found OH and stopped Imdur.  BP has been higher since She went down the wrong road today and her BP is much higher than normal.  No chest pain or pressure .  No SOB/DOE and no PND/Orthopnea.  No weight gain or leg swelling.  No further syncope since orthostatic episode.  She has extensive work up for this in the past and has asked for conservative evaluation.   Relevant histories: .  Social- former HP, former Hs patient ROS: As per HPI.   Studies Reviewed: .   Cardiac Studies & Procedures   CARDIAC CATHETERIZATION  CARDIAC CATHETERIZATION 04/20/2015  Narrative 1. Mid RCA to Dist RCA lesion, 70% stenosed. 2. 1st RPLB lesion, 70% stenosed. 3. 1st Mrg-1 lesion, 50% stenosed. 4. Dist Cx lesion, 40% stenosed. 5. Prox LAD to Dist LAD lesion, 25% stenosed. 6. LM lesion, 50% stenosed.   Probable mid to distal left main disease. Rule out contrast streaming.  Patent first obtuse marginal stents placed one year ago. Irregularities noted in the mid circumflex.  New mid RCA 50-70% stenosis. 50% left ventricular branch at the site of previous PTCA.  Overall normal left ventricular function.  Ejection fraction 50%.   RECOMMENDATIONS:   Discontinue Plavix  TCTS consultation  Coronary CT angiogram to specifically evaluate the mid to distal left main.  Findings Coronary Findings Diagnostic  Dominance: Co-dominant  Left Main The lesion is type C eccentric.  Left Anterior Descending diffuse eccentric.  Ramus Intermedius . Vessel is small.  Left Circumflex  First Obtuse Marginal Branch eccentric. Previously placed 1st Mrg-2 stent (unknown type) is patent.  Third Obtuse Marginal Branch The vessel is small in size.  Right Coronary Artery eccentric.  Acute Marginal Branch The vessel is small in size.  First Right Posterolateral Branch The vessel is small in size.  Intervention  No interventions have been documented.   STRESS TESTS  MYOCARDIAL PERFUSION IMAGING 08/20/2022  Narrative   Findings are consistent with infarction. The study is intermediate risk.   No ST deviation was noted.   Left ventricular function is normal. Nuclear stress EF: 60 %. The left ventricular ejection fraction is normal (55-65%). End diastolic cavity size is normal. End systolic cavity size is normal.   Prior study available for comparison from 12/25/2012. No changes compared to prior study.  Lateral infarct with preserved ejection fraction. Intermediate risk due to infarct burden.   ECHOCARDIOGRAM  ECHOCARDIOGRAM COMPLETE 01/16/2022  Narrative ECHOCARDIOGRAM REPORT    Patient Name:   Carol [name] Date of Exam: 01/16/2022 Medical Rec #:  [id number]        Height:  Cardiology Office Note:  .    Date:  03/29/2023  ID:  Carol [name], DOB 1935-12-29, MRN [id number] PCP: Blane Ohara, MD  [facility] Providers Cardiologist:  None     CC: Transition to new cardiologist  History of Present Illness: .    Carol [name] is a 87 y.o. female CAD s/p CABG who presents after syncopal episode related to low blood pressure followed by labile HTN off her Imdur. Discussed the use of AI scribe software for clinical note transcription with the patient, who gave verbal consent to proceed. 2-24: Saw Scott for [employer].  No ischemia on NM stress test.  Saw HP with improvement in black out spetls.  Patient notes that she is doing fair.   Since last visit notes one episode of hypotension related syncope at a [place] in [place] . No ED admission.  PCP found OH and stopped Imdur.  BP has been higher since She went down the wrong road today and her BP is much higher than normal.  No chest pain or pressure .  No SOB/DOE and no PND/Orthopnea.  No weight gain or leg swelling.  No further syncope since orthostatic episode.  She has extensive work up for this in the past and has asked for conservative evaluation.   Relevant histories: .  Social- former HP, former Hs patient ROS: As per HPI.   Studies Reviewed: .   Cardiac Studies & Procedures   CARDIAC CATHETERIZATION  CARDIAC CATHETERIZATION 04/20/2015  Narrative 1. Mid RCA to Dist RCA lesion, 70% stenosed. 2. 1st RPLB lesion, 70% stenosed. 3. 1st Mrg-1 lesion, 50% stenosed. 4. Dist Cx lesion, 40% stenosed. 5. Prox LAD to Dist LAD lesion, 25% stenosed. 6. LM lesion, 50% stenosed.   Probable mid to distal left main disease. Rule out contrast streaming.  Patent first obtuse marginal stents placed one year ago. Irregularities noted in the mid circumflex.  New mid RCA 50-70% stenosis. 50% left ventricular branch at the site of previous PTCA.  Overall normal left ventricular function.  Ejection fraction 50%.   RECOMMENDATIONS:   Discontinue Plavix  TCTS consultation  Coronary CT angiogram to specifically evaluate the mid to distal left main.  Findings Coronary Findings Diagnostic  Dominance: Co-dominant  Left Main The lesion is type C eccentric.  Left Anterior Descending diffuse eccentric.  Ramus Intermedius . Vessel is small.  Left Circumflex  First Obtuse Marginal Branch eccentric. Previously placed 1st Mrg-2 stent (unknown type) is patent.  Third Obtuse Marginal Branch The vessel is small in size.  Right Coronary Artery eccentric.  Acute Marginal Branch The vessel is small in size.  First Right Posterolateral Branch The vessel is small in size.  Intervention  No interventions have been documented.   STRESS TESTS  MYOCARDIAL PERFUSION IMAGING 08/20/2022  Narrative   Findings are consistent with infarction. The study is intermediate risk.   No ST deviation was noted.   Left ventricular function is normal. Nuclear stress EF: 60 %. The left ventricular ejection fraction is normal (55-65%). End diastolic cavity size is normal. End systolic cavity size is normal.   Prior study available for comparison from 12/25/2012. No changes compared to prior study.  Lateral infarct with preserved ejection fraction. Intermediate risk due to infarct burden.   ECHOCARDIOGRAM  ECHOCARDIOGRAM COMPLETE 01/16/2022  Narrative ECHOCARDIOGRAM REPORT    Patient Name:   Carol [name] Date of Exam: 01/16/2022 Medical Rec #:  [id number]        Height:  67.0 in Accession #:    [id number]       Weight:       200.0 lb Date of Birth:  04-09-1936        BSA:          2.022 m Patient Age:    85 years         BP:           130/60 mmHg Patient Gender: F                HR:           62 bpm. Exam Location:  [address]  Procedure: 2D Echo, Cardiac Doppler, Color Doppler and 3D Echo  Indications:    Shortness of Breath R06.02  History:        Patient  has prior history of Echocardiogram examinations, most recent 04/25/2015. CAD and Previous Myocardial Infarction, Prior CABG, Arrythmias:LBBB; Risk Factors:Hypertension, Diabetes and Dyslipidemia.  Sonographer:    Thurman Coyer RDCS Referring Phys: [id number] Barry Dienes [facility]  IMPRESSIONS   1. Left ventricular ejection fraction, by estimation, is 60 to 65%. The left ventricle has normal function. The left ventricle has no regional wall motion abnormalities. Left ventricular diastolic parameters are consistent with Grade I diastolic dysfunction (impaired relaxation). 2. Right ventricular systolic function is normal. The right ventricular size is normal. Tricuspid regurgitation signal is inadequate for assessing PA pressure. 3. The mitral valve is normal in structure. Mild mitral valve regurgitation. No evidence of mitral stenosis. 4. The aortic valve is tricuspid. There is mild calcification of the aortic valve. Aortic valve regurgitation is trivial. Aortic valve sclerosis is present, with no evidence of aortic valve stenosis. 5. The inferior vena cava is dilated in size with >50% respiratory variability, suggesting right atrial pressure of 8 mmHg.  Comparison(s): No prior Echocardiogram.  FINDINGS Left Ventricle: Left ventricular ejection fraction, by estimation, is 60 to 65%. The left ventricle has normal function. The left ventricle has no regional wall motion abnormalities. The left ventricular internal cavity size was normal in size. There is no left ventricular hypertrophy. Left ventricular diastolic parameters are consistent with Grade I diastolic dysfunction (impaired relaxation).  Right Ventricle: The right ventricular size is normal. No increase in right ventricular wall thickness. Right ventricular systolic function is normal. Tricuspid regurgitation signal is inadequate for assessing PA pressure.  Left Atrium: Left atrial size was normal in size.  Right Atrium: Right atrial size was  normal in size.  Pericardium: There is no evidence of pericardial effusion.  Mitral Valve: The mitral valve is normal in structure. Mild mitral valve regurgitation. No evidence of mitral valve stenosis.  Tricuspid Valve: The tricuspid valve is normal in structure. Tricuspid valve regurgitation is not demonstrated. No evidence of tricuspid stenosis.  Aortic Valve: The aortic valve is tricuspid. There is mild calcification of the aortic valve. There is moderate aortic valve annular calcification. Aortic valve regurgitation is trivial. Aortic valve sclerosis is present, with no evidence of aortic valve stenosis.  Pulmonic Valve: The pulmonic valve was not well visualized. Pulmonic valve regurgitation is not visualized. No evidence of pulmonic stenosis.  Aorta: The aortic root and ascending aorta are structurally normal, with no evidence of dilitation.  Venous: The inferior vena cava is dilated in size with greater than 50% respiratory variability, suggesting right atrial pressure of 8 mmHg.  IAS/Shunts: No atrial level shunt detected by color flow Doppler.   LEFT VENTRICLE PLAX 2D LVIDd:  Cardiology Office Note:  .    Date:  03/29/2023  ID:  Carol [name], DOB 1935-12-29, MRN [id number] PCP: Blane Ohara, MD  [facility] Providers Cardiologist:  None     CC: Transition to new cardiologist  History of Present Illness: .    Carol [name] is a 87 y.o. female CAD s/p CABG who presents after syncopal episode related to low blood pressure followed by labile HTN off her Imdur. Discussed the use of AI scribe software for clinical note transcription with the patient, who gave verbal consent to proceed. 2-24: Saw Scott for [employer].  No ischemia on NM stress test.  Saw HP with improvement in black out spetls.  Patient notes that she is doing fair.   Since last visit notes one episode of hypotension related syncope at a [place] in [place] . No ED admission.  PCP found OH and stopped Imdur.  BP has been higher since She went down the wrong road today and her BP is much higher than normal.  No chest pain or pressure .  No SOB/DOE and no PND/Orthopnea.  No weight gain or leg swelling.  No further syncope since orthostatic episode.  She has extensive work up for this in the past and has asked for conservative evaluation.   Relevant histories: .  Social- former HP, former Hs patient ROS: As per HPI.   Studies Reviewed: .   Cardiac Studies & Procedures   CARDIAC CATHETERIZATION  CARDIAC CATHETERIZATION 04/20/2015  Narrative 1. Mid RCA to Dist RCA lesion, 70% stenosed. 2. 1st RPLB lesion, 70% stenosed. 3. 1st Mrg-1 lesion, 50% stenosed. 4. Dist Cx lesion, 40% stenosed. 5. Prox LAD to Dist LAD lesion, 25% stenosed. 6. LM lesion, 50% stenosed.   Probable mid to distal left main disease. Rule out contrast streaming.  Patent first obtuse marginal stents placed one year ago. Irregularities noted in the mid circumflex.  New mid RCA 50-70% stenosis. 50% left ventricular branch at the site of previous PTCA.  Overall normal left ventricular function.  Ejection fraction 50%.   RECOMMENDATIONS:   Discontinue Plavix  TCTS consultation  Coronary CT angiogram to specifically evaluate the mid to distal left main.  Findings Coronary Findings Diagnostic  Dominance: Co-dominant  Left Main The lesion is type C eccentric.  Left Anterior Descending diffuse eccentric.  Ramus Intermedius . Vessel is small.  Left Circumflex  First Obtuse Marginal Branch eccentric. Previously placed 1st Mrg-2 stent (unknown type) is patent.  Third Obtuse Marginal Branch The vessel is small in size.  Right Coronary Artery eccentric.  Acute Marginal Branch The vessel is small in size.  First Right Posterolateral Branch The vessel is small in size.  Intervention  No interventions have been documented.   STRESS TESTS  MYOCARDIAL PERFUSION IMAGING 08/20/2022  Narrative   Findings are consistent with infarction. The study is intermediate risk.   No ST deviation was noted.   Left ventricular function is normal. Nuclear stress EF: 60 %. The left ventricular ejection fraction is normal (55-65%). End diastolic cavity size is normal. End systolic cavity size is normal.   Prior study available for comparison from 12/25/2012. No changes compared to prior study.  Lateral infarct with preserved ejection fraction. Intermediate risk due to infarct burden.   ECHOCARDIOGRAM  ECHOCARDIOGRAM COMPLETE 01/16/2022  Narrative ECHOCARDIOGRAM REPORT    Patient Name:   Carol [name] Date of Exam: 01/16/2022 Medical Rec #:  [id number]        Height:  4.60 cm   Diastology LVIDs:         3.00 cm   LV e' medial:    6.20 cm/s LV PW:         0.90 cm   LV E/e' medial:  17.1 LV IVS:        0.80 cm   LV e' lateral:   8.92 cm/s LVOT diam:     2.30 cm   LV E/e' lateral: 11.9 LV SV:         73 LV SV Index:   36 LVOT Area:     4.15 cm  3D Volume EF: 3D EF:        64 % LV EDV:       146 ml LV ESV:       53 ml LV SV:        93 ml  RIGHT VENTRICLE RV Basal diam:  3.20 cm RV Mid diam:    3.40 cm RV S prime:     9.03 cm/s TAPSE (M-mode): 1.8 cm  LEFT ATRIUM           Index        RIGHT ATRIUM           Index LA diam:      5.20 cm 2.57 cm/m   RA Area:     20.30 cm LA Vol (A4C): 92.1 ml 45.54 ml/m  RA Volume:   56.40 ml  27.89 ml/m AORTIC VALVE LVOT Vmax:   68.80 cm/s LVOT Vmean:  45.500 cm/s LVOT VTI:    0.176 m  AORTA Ao Root diam:  3.10 cm Ao Asc diam:  3.30 cm  MITRAL VALVE MV Area (PHT): 3.42 cm     SHUNTS MV Decel Time: 222 msec     Systemic VTI:  0.18 m MV E velocity: 106.00 cm/s  Systemic Diam: 2.30 cm MV A velocity: 127.00 cm/s MV E/A ratio:  0.83  Riley Lam MD Electronically signed by Riley Lam MD Signature Date/Time: 01/16/2022/11:12:24 AM    Final   TEE  ECHO TEE 04/25/2015  Narrative *[place]* *[facility]* [address] [place], [place] [address] [phone]  ------------------------------------------------------------------- Intraoperative Transesophageal Echocardiography  Patient:    Sache, Sane MR #:       [id number] Study Date: 04/25/2015 Gender:     F Age:        56 Height: Weight: BSA: Pt. Status: Room:       3K74Q  ADMITTING    Lyn Records, MD ATTENDING    Evelene Croon, MD ORDERING     Evelene Croon, MD REFERRING    Evelene Croon, MD SONOGRAPHER  [facility] PERFORMING   Corky Sox, M.D.  cc:  ------------------------------------------------------------------- LV EF: 60% -  ------------------------------------------------------------------- Study Conclusions  - Left ventricle: The cavity size was normal. Wall thickness was normal. The estimated ejection fraction was = 60%. Wall motion was normal; there were no regional wall motion abnormalities. - Aortic valve: Normal-sized, mildly calcified annulus. Trileaflet; mildly thickened leaflets. - Mitral valve: Valve area by continuity equation (using LVOT flow): 2.44 cm^2. - Left atrium: No evidence of thrombus in the atrial cavity or appendage. - Atrial septum: No defect or patent foramen ovale was identified.  Intraoperative transesophageal echocardiography.  Birthdate: Patient birthdate: 07-31-1935.  Age:  Patient is 87 yr old.  Sex: Gender: female.  Study date:  Study date: 04/25/2015. Study time: 09:46  AM.  -------------------------------------------------------------------  ------------------------------------------------------------------- Left ventricle:  The  4.60 cm   Diastology LVIDs:         3.00 cm   LV e' medial:    6.20 cm/s LV PW:         0.90 cm   LV E/e' medial:  17.1 LV IVS:        0.80 cm   LV e' lateral:   8.92 cm/s LVOT diam:     2.30 cm   LV E/e' lateral: 11.9 LV SV:         73 LV SV Index:   36 LVOT Area:     4.15 cm  3D Volume EF: 3D EF:        64 % LV EDV:       146 ml LV ESV:       53 ml LV SV:        93 ml  RIGHT VENTRICLE RV Basal diam:  3.20 cm RV Mid diam:    3.40 cm RV S prime:     9.03 cm/s TAPSE (M-mode): 1.8 cm  LEFT ATRIUM           Index        RIGHT ATRIUM           Index LA diam:      5.20 cm 2.57 cm/m   RA Area:     20.30 cm LA Vol (A4C): 92.1 ml 45.54 ml/m  RA Volume:   56.40 ml  27.89 ml/m AORTIC VALVE LVOT Vmax:   68.80 cm/s LVOT Vmean:  45.500 cm/s LVOT VTI:    0.176 m  AORTA Ao Root diam:  3.10 cm Ao Asc diam:  3.30 cm  MITRAL VALVE MV Area (PHT): 3.42 cm     SHUNTS MV Decel Time: 222 msec     Systemic VTI:  0.18 m MV E velocity: 106.00 cm/s  Systemic Diam: 2.30 cm MV A velocity: 127.00 cm/s MV E/A ratio:  0.83  Riley Lam MD Electronically signed by Riley Lam MD Signature Date/Time: 01/16/2022/11:12:24 AM    Final   TEE  ECHO TEE 04/25/2015  Narrative *[place]* *[facility]* [address] [place], [place] [address] [phone]  ------------------------------------------------------------------- Intraoperative Transesophageal Echocardiography  Patient:    Sache, Sane MR #:       [id number] Study Date: 04/25/2015 Gender:     F Age:        56 Height: Weight: BSA: Pt. Status: Room:       3K74Q  ADMITTING    Lyn Records, MD ATTENDING    Evelene Croon, MD ORDERING     Evelene Croon, MD REFERRING    Evelene Croon, MD SONOGRAPHER  [facility] PERFORMING   Corky Sox, M.D.  cc:  ------------------------------------------------------------------- LV EF: 60% -  ------------------------------------------------------------------- Study Conclusions  - Left ventricle: The cavity size was normal. Wall thickness was normal. The estimated ejection fraction was = 60%. Wall motion was normal; there were no regional wall motion abnormalities. - Aortic valve: Normal-sized, mildly calcified annulus. Trileaflet; mildly thickened leaflets. - Mitral valve: Valve area by continuity equation (using LVOT flow): 2.44 cm^2. - Left atrium: No evidence of thrombus in the atrial cavity or appendage. - Atrial septum: No defect or patent foramen ovale was identified.  Intraoperative transesophageal echocardiography.  Birthdate: Patient birthdate: 07-31-1935.  Age:  Patient is 87 yr old.  Sex: Gender: female.  Study date:  Study date: 04/25/2015. Study time: 09:46  AM.  -------------------------------------------------------------------  ------------------------------------------------------------------- Left ventricle:  The

## 2023-03-29 NOTE — Patient Instructions (Signed)
Medication Instructions:  Your physician has recommended you make the following change in your medication:  START: isosorbide mononitrate (Imdur) 15 mg (half a pill) by mouth once daily  *If you need a refill on your cardiac medications before your next appointment, please call your pharmacy*   Lab Work: NONE If you have labs (blood work) drawn today and your tests are completely normal, you will receive your results only by: MyChart Message (if you have MyChart) OR A paper copy in the mail If you have any lab test that is abnormal or we need to change your treatment, we will call you to review the results.   Testing/Procedures: Your physician has requested that you have a carotid duplex. This test is an ultrasound of the carotid arteries in your neck. It looks at blood flow through these arteries that supply the brain with blood. Allow one hour for this exam. There are no restrictions or special instructions.    Follow-Up: At [facility], you and your health needs are our priority.  As part of our continuing mission to provide you with exceptional heart care, we have created designated Provider Care Teams.  These Care Teams include your primary Cardiologist (physician) and Advanced Practice Providers (APPs -  Physician Assistants and Nurse Practitioners) who all work together to provide you with the care you need, when you need it.   Your next appointment:   4 month(s)  Provider:   Tereso Newcomer, PA-C     Then, None will plan to see you again in 1 year(s).

## 2023-04-05 DIAGNOSIS — E1122 Type 2 diabetes mellitus with diabetic chronic kidney disease: Secondary | ICD-10-CM | POA: Diagnosis not present

## 2023-04-09 ENCOUNTER — Ambulatory Visit (HOSPITAL_COMMUNITY)
Admission: RE | Admit: 2023-04-09 | Discharge: 2023-04-09 | Disposition: A | Discharge status: Home / Self Care | Payer: Medicare HMO | Source: Ambulatory Visit | Attending: Cardiology | Admitting: Cardiology

## 2023-04-09 DIAGNOSIS — I6523 Occlusion and stenosis of bilateral carotid arteries: Secondary | ICD-10-CM | POA: Diagnosis not present

## 2023-04-09 DIAGNOSIS — E782 Mixed hyperlipidemia: Secondary | ICD-10-CM | POA: Insufficient documentation

## 2023-05-02 ENCOUNTER — Other Ambulatory Visit: Payer: Self-pay | Admitting: Family Medicine

## 2023-05-02 DIAGNOSIS — I2510 Atherosclerotic heart disease of native coronary artery without angina pectoris: Secondary | ICD-10-CM

## 2023-05-06 ENCOUNTER — Ambulatory Visit (INDEPENDENT_AMBULATORY_CARE_PROVIDER_SITE_OTHER): Payer: Medicare HMO

## 2023-05-06 DIAGNOSIS — E1122 Type 2 diabetes mellitus with diabetic chronic kidney disease: Secondary | ICD-10-CM | POA: Diagnosis not present

## 2023-05-06 DIAGNOSIS — Z0000 Encounter for general adult medical examination without abnormal findings: Secondary | ICD-10-CM | POA: Diagnosis not present

## 2023-05-06 NOTE — Progress Notes (Signed)
Annual Wellness Visit     Patient: Carol [name], Female    DOB: 24-Jul-1935, 87 y.o.   MRN: [id number]  Subjective  Chief Complaint  Patient presents with   Annual Exam    Carol [name] is a 87 y.o. female who presents today for her Annual Wellness Visit. She reports consuming a general diet.  Generaaly active  She generally feels well. She reports sleeping well. She does not have additional problems to discuss today.   HPI  Vision:Within last year   Patient Active Problem List   Diagnosis Date Noted   Acute cystitis without hematuria 02/20/2023   Herpes zoster without complication 12/11/2022   Essential hypertension 10/27/2022   Syncope, near 08/09/2022   Anemia 08/02/2022   Asthma 08/02/2022   Carotid stenosis 08/02/2022   Contrast media allergy 08/02/2022   Environmental allergies 08/02/2022   HOH (hard of hearing) 08/02/2022   LBBB (left bundle branch block) 08/02/2022   NSTEMI (non-ST elevated myocardial infarction) (HCC) 08/02/2022   QT prolongation 08/02/2022   TIA (transient ischemic attack) 08/02/2022   Sinus bradycardia 07/18/2022   Class 1 obesity due to excess calories with serious comorbidity and body mass index (BMI) of 30.0 to 30.9 in adult 01/14/2022   Dizziness 11/10/2021   Osteoporosis screening 10/03/2021   Myalgia due to statin 10/03/2021   Chronic idiopathic constipation 09/28/2021   S/P TKR (total knee replacement) using cement, left 04/21/2018   Hx of CABG 04/25/2015   GERD (gastroesophageal reflux disease) 04/19/2015   Hypertensive heart and renal disease 04/19/2015   Spinal stenosis, lumbar region, with neurogenic claudication 12/22/2014   Controlled type 2 diabetes mellitus with diabetic neuropathy, with long-term current use of insulin (HCC) 04/10/2014   Asthma, moderate persistent 03/23/2014   Post-infarction angina (HCC) 02/17/2014   Abnormal nuclear stress test: Intermediate risk 02/17/2014   Presence of drug coated stent in  left circumflex coronary artery: OM2 - Xience Alpine DES 2.25 mm x 12, 2.25 mm x 8 mm overlap 02/17/2014    Class: Acute   Ischemic cardiomyopathy - with near resolution of LVEF post MI 02/15/2014   Heart failure with improved ejection fraction (HFimpEF) (HCC) 02/15/2014   Chronic kidney disease, stage 4 (severe) (HCC) 02/15/2014   Left-sided chest pain 01/22/2014   Hyperlipidemia 01/22/2014   History of non-ST elevation myocardial infarction (NSTEMI) 01/22/2014   CAD in native artery 01/22/2014   Mixed conductive and sensorineural hearing loss 12/05/2012   Acquired hypothyroidism 12/05/2012   Lower back pain 12/05/2012   Gout, unspecified    Past Medical History:  Diagnosis Date   Anemia    a. Noted on labs 02/2014 possibly procedurally related.   Asthma    CAD S/P percutaneous coronary angioplasty 01/22/2014   A. (12/2013): POBA - OM2 99% (very tortuous segment) - reduced ~ 50% & TIMI 3 flow, 80-90% stenosis in mid PDA, Irregularities <50% in mRCA, pLAD and prox LCx; b. 02/17/14: Moderate sized fixed defect in Lateral wall --> cath with OM2 restenosis & otherwise stable --> Xience Alpine DES x 2 (2.25 mm x 12 & 8 mm). c. NSTEMI 02/2014 s/p PTCA/balloon angioplasty only to small caliber PDA.   Carotid stenosis    Carotid US 11/17: L 1-39; FU prn   Chronic systolic heart failure (HCC)    a. ICM b. ECHO (01/2014): EF 25-30%, grade I DD, trivial MR. c. EF by Myoview 02/17/14 = ~53%. d. EF by cath 02/24/14 - improved to 50-55%.   CKD (chronic  kidney disease), stage III (HCC)    Contrast media allergy    Environmental allergies    GERD (gastroesophageal reflux disease)    Gout, unspecified    HOH (hard of hearing)    Hyperlipidemia    a. H/o intolerance to Zocor, Lipitor. Had muscle aches on higher dose Crestor.   Hypertension    Hypothyroidism    LBBB (left bundle branch block)    a. Transient during 02/2014 admission.   NSTEMI (non-ST elevated myocardial infarction) (HCC) 2014; 12/2013,  01/2014   QT prolongation    a. Noted on EKG 02/2014.   Sinus bradycardia    a. HR 40s-50s during 02/2014 admission, limiting BB dose.   TIA (transient ischemic attack) 1990's   a. TIA vs stroke 1990's "mild" - sounds like a TIA as she was told she had stroke symptoms but negative scans. Denies residual effects.   Type II diabetes mellitus [facility])    Past Surgical History:  Procedure Laterality Date   ABDOMINAL HYSTERECTOMY     complete   AMPUTATION TOE Right 02/25/2021   hammer toe.   APPENDECTOMY  1959   CARDIAC CATHETERIZATION  02/24/2014   Procedure: CORONARY BALLOON ANGIOPLASTY;  Surgeon: Kathleene Hazel, MD;  Location: [facility] CATH LAB;  Service: Cardiovascular;;   CARDIAC CATHETERIZATION N/A 04/20/2015   Procedure: Left Heart Cath and Coronary Angiography;  Surgeon: Lyn Records, MD;  Location: [facility] INVASIVE CV LAB;  Service: Cardiovascular;  Laterality: N/A;   CATARACT EXTRACTION W/ INTRAOCULAR LENS  IMPLANT, BILATERAL Bilateral 2013   CESAREAN SECTION  1959; 1960   CHOLECYSTECTOMY  1989   CORONARY ANGIOPLASTY  12/2013   POBA - OM2   CORONARY ARTERY BYPASS GRAFT N/A 04/25/2015   Procedure: CORONARY ARTERY BYPASS GRAFTING (CABG) x four,  using left internal mammary artery and right leg greater saphenous vein harvested endoscopically;  Surgeon: Alleen Borne, MD;  Location: [facility] OR;  Service: Open Heart Surgery;  Laterality: N/A;   JOINT REPLACEMENT Right 10 years ago    right knee (TKR)   LEFT HEART CATHETERIZATION WITH CORONARY ANGIOGRAM N/A 01/22/2014   Procedure: LEFT HEART CATHETERIZATION WITH CORONARY ANGIOGRAM;  Surgeon: Lesleigh Noe, MD;  Location: [facility] CATH LAB;  Service: Cardiovascular;  Laterality: N/A;   LEFT HEART CATHETERIZATION WITH CORONARY ANGIOGRAM N/A 02/17/2014   Procedure: LEFT HEART CATHETERIZATION WITH CORONARY ANGIOGRAM;  Surgeon: Kathleene Hazel, MD;  Location: [facility] CATH LAB;  Service: Cardiovascular;  Laterality: N/A;   LEFT HEART CATHETERIZATION  WITH CORONARY ANGIOGRAM N/A 02/24/2014   Procedure: LEFT HEART CATHETERIZATION WITH CORONARY ANGIOGRAM;  Surgeon: Kathleene Hazel, MD;  Location: [facility] CATH LAB;  Service: Cardiovascular;  Laterality: N/A;   LUMBAR LAMINECTOMY/DECOMPRESSION MICRODISCECTOMY Left 12/22/2014   Procedure: CENTRAL DECOMPRESSIVE LUMBAR, AND LAMINECTOMY L5-S1,  S1-S2 FOR SPINAL STENOSIS FORAMINOTOMY L5, S1, S2 ROOTS ON LEFT FOR FORAMINAL STENOSIS, AND DECOMPRESSION L5-S1, S1-S2 ACCORDING TO LABELING OF [facility] X-RAYS;  Surgeon: Ranee Gosselin, MD;  Location: [facility] ORS;  Service: Orthopedics;  Laterality: Left;   PERCUTANEOUS CORONARY STENT INTERVENTION (PCI-S)  02/17/2014   For restenosis of OM2 - PCI with Xience Apline DES 2.25 mm x 12 mm & 2.25 mm x 8 mm overlapping   REDUCTION MAMMAPLASTY Bilateral 1990's   SHOULDER OPEN ROTATOR CUFF REPAIR Bilateral 1980's - 1990's   TEE WITHOUT CARDIOVERSION N/A 04/25/2015   Procedure: TRANSESOPHAGEAL ECHOCARDIOGRAM (TEE);  Surgeon: Alleen Borne, MD;  Location: [facility] OR;  Service: Open Heart Surgery;  Laterality: N/A;  TOTAL KNEE ARTHROPLASTY Right 2009   TOTAL KNEE ARTHROPLASTY Left 04/21/2018   TKR;  Surgeon: Dannielle Huh, MD;  Location: [facility] ORS;  Service: Orthopedics;  Laterality: Left;  Adductor Block   Social History   Socioeconomic History   Marital status: Widowed    Spouse name: Not on file   Number of children: 2   Years of education: Not on file   Highest education level: Not on file  Occupational History   Occupation: Retired  Tobacco Use   Smoking status: Never   Smokeless tobacco: Never  Vaping Use   Vaping status: Never Used  Substance and Sexual Activity   Alcohol use: No   Drug use: No   Sexual activity: Never  Other Topics Concern   Not on file  Social History Narrative   Lives in [place] by herself.    Social Determinants of Health   Financial Resource Strain: Low Risk  (10/22/2022)   Overall Financial Resource Strain (CARDIA)     Difficulty of Paying Living Expenses: Not hard at all  Food Insecurity: No Food Insecurity (10/22/2022)   Hunger Vital Sign    Worried About Running Out of Food in the Last Year: Never true    Ran Out of Food in the Last Year: Never true  Transportation Needs: No Transportation Needs (10/22/2022)   PRAPARE - Administrator, Civil Service (Medical): No    Lack of Transportation (Non-Medical): No  Physical Activity: Sufficiently Active (10/22/2022)   Exercise Vital Sign    Days of Exercise per Week: 6 days    Minutes of Exercise per Session: 40 min  Stress: No Stress Concern Present (10/22/2022)   [employer] of Occupational Health - Occupational Stress Questionnaire    Feeling of Stress : Not at all  Social Connections: Moderately Isolated (10/22/2022)   Social Connection and Isolation Panel [NHANES]    Frequency of Communication with Friends and Family: Three times a week    Frequency of Social Gatherings with Friends and Family: Three times a week    Attends Religious Services: More than 4 times per year    Active Member of Clubs or Organizations: No    Attends Banker Meetings: Never    Marital Status: Widowed  Intimate Partner Violence: Not At Risk (10/22/2022)   Humiliation, Afraid, Rape, and Kick questionnaire    Fear of Current or Ex-Partner: No    Emotionally Abused: No    Physically Abused: No    Sexually Abused: No   Family Status  Relation Name Status   Mother  Deceased   Father  Deceased   Sister  Alive   Brother  Alive   Brother  Deceased   Brother  Deceased   Sister  Alive   MGM  Deceased   MGF  Deceased   PGM  Deceased   PGF  Deceased   Sister  (Not Specified)   Neg Hx  (Not Specified)  No partnership data on file   Allergies  Allergen Reactions   Iodinated Contrast Media Anaphylaxis and Other (See Comments)    unknown Contrast agent "Makes me shake all over"   Atorvastatin Other (See Comments)    Muscle aches- severe    Corticosteroids Other (See Comments)    Elevated blood sugar     Hydrocodone Nausea And Vomiting   Other Nausea And Vomiting    Pain medications except can tramadol   Oxycodone Nausea And Vomiting   Simvastatin Other (See Comments)  Muscle aches- severe   Cortizone-10 [Hydrocortisone] Other (See Comments)    unknown      Medications: Outpatient Medications Prior to Visit  Medication Sig   albuterol (VENTOLIN HFA) 108 (90 Base) MCG/ACT inhaler INHALE 2 PUFFS INTO THE LUNGS EVERY 6 (SIX) HOURS AS NEEDED FOR WHEEZING OR SHORTNESS OF BREATH.   Alcohol Swabs (ALCOHOL WIPES) 70 % PADS USE ONE PAD TO CLEAN AREA TO CHECK BLOOD SUGAR AS  DIRECTED   allopurinol (ZYLOPRIM) 300 MG tablet Take 1 tablet (300 mg total) by mouth every morning.   Biotin 5000 MCG CAPS Take 5,000 mcg by mouth every morning.   Blood Glucose Monitoring Suppl (TRUE METRIX METER) DEVI Use to test blood sugars once daily. ICD: E11.65   calcium carbonate (OSCAL) 1500 (600 Ca) MG TABS tablet Take 600 mg by mouth 2 (two) times daily with a meal.   carvedilol (COREG) 6.25 MG tablet Take 1 tablet (6.25 mg total) by mouth 2 (two) times daily with a meal.   clopidogrel (PLAVIX) 75 MG tablet TAKE 1 TABLET EVERY DAY (NEED TO SCHEDULE A FOLLOW UP VISIT)   Continuous Blood Gluc Receiver (FREESTYLE LIBRE READER) DEVI 1 each by Does not apply route as directed.   empagliflozin (JARDIANCE) 25 MG TABS tablet Take 1 tablet (25 mg total) by mouth daily before breakfast.   Evolocumab (REPATHA SURECLICK) 140 MG/ML SOAJ Inject 140 mg into the skin every 14 (fourteen) days.   ezetimibe (ZETIA) 10 MG tablet Take 1 tablet (10 mg total) by mouth daily.   famotidine (PEPCID) 10 MG tablet Take 10 mg by mouth 2 (two) times daily as needed for heartburn or indigestion.    fluticasone (FLONASE) 50 MCG/ACT nasal spray USE 1 SPRAY NASALLY EVERY DAY   furosemide (LASIX) 80 MG tablet TAKE 1 TABLET EVERY DAY (Patient taking differently: Take 80 mg by mouth  as needed.)   gabapentin (NEURONTIN) 300 MG capsule Take 1 capsule (300 mg total) by mouth 3 (three) times daily as needed.   glucose blood (TRUE METRIX BLOOD GLUCOSE TEST) test strip Use as instructed   insulin NPH-regular Human (NOVOLIN 70/30) (70-30) 100 UNIT/ML injection Inject 25-32 Units into the skin daily. Reports 20 units in morning and 10-12 at night if needed.   isosorbide mononitrate (IMDUR) 30 MG 24 hr tablet Take 0.5 tablets (15 mg total) by mouth daily.   levothyroxine (SYNTHROID, LEVOTHROID) 50 MCG tablet Take 50 mcg by mouth every morning.    methocarbamol (ROBAXIN) 500 MG tablet Take 1-2 tablets (500-1,000 mg total) by mouth every 6 (six) hours as needed for muscle spasms.   nitroGLYCERIN (NITROSTAT) 0.4 MG SL tablet DISSOLVE 1 TABLET UNDER THE TONGUE EVERY 5 MINUTES AS NEEDED FOR CHEST PAIN UP TO 3 TIMES. CALL EMS IF YOU NEED A SECOND.   pantoprazole (PROTONIX) 40 MG tablet TAKE 1 TABLET EVERY DAY   potassium chloride (KLOR-CON) 10 MEQ tablet Take 10 mEq by mouth as needed. To take one (1) tablet if you take extra lasix for weight gain.   ranolazine (RANEXA) 500 MG 12 hr tablet TAKE 1 TABLET TWICE DAILY   TRADJENTA 5 MG TABS tablet TAKE 1 TABLET EVERY DAY   TRUEplus Lancets 33G MISC Use to test blood sugar once daily. ICD: E11.65   valACYclovir (VALTREX) 500 MG tablet Take 1 tablet (500 mg total) by mouth 3 (three) times daily.   colchicine 0.6 MG tablet Take 1 tablet (0.6 mg total) by mouth 2 (two) times daily as needed (gout  flare up.).   No facility-administered medications prior to visit.    Allergies  Allergen Reactions   Iodinated Contrast Media Anaphylaxis and Other (See Comments)    unknown Contrast agent "Makes me shake all over"   Atorvastatin Other (See Comments)    Muscle aches- severe   Corticosteroids Other (See Comments)    Elevated blood sugar     Hydrocodone Nausea And Vomiting   Other Nausea And Vomiting    Pain medications except can tramadol    Oxycodone Nausea And Vomiting   Simvastatin Other (See Comments)    Muscle aches- severe   Cortizone-10 [Hydrocortisone] Other (See Comments)    unknown    Patient Care Team: Blane Ohara, MD as PCP - General (Internal Medicine) Baldo Daub, MD as Consulting Physician (Cardiology) Regan Lemming, MD as Consulting Physician (Cardiology) [facility]      Objective  There were no vitals taken for this visit.    Physical Exam    Most recent functional status assessment:    10/22/2022    2:45 PM  In your present state of health, do you have any difficulty performing the following activities:  Hearing? 0  Vision? 0  Difficulty concentrating or making decisions? 0  Walking or climbing stairs? 0  Dressing or bathing? 0  Doing errands, shopping? 0   Most recent fall risk assessment:    05/06/2023    2:22 PM  Fall Risk   Falls in the past year? 1  Number falls in past yr: 1  Injury with Fall? 0  Follow up Falls evaluation completed    Most recent depression screenings:    05/06/2023    2:23 PM 02/18/2023   10:56 AM  PHQ 2/9 Scores  PHQ - 2 Score 0 0  PHQ- 9 Score  0   Most recent cognitive screening:    05/06/2023    2:23 PM  6CIT Screen  What Year? 0 points  What month? 0 points  What time? 0 points  Count back from 20 0 points  Months in reverse 0 points  Repeat phrase 0 points  Total Score 0 points   Most recent Audit-C alcohol use screening    10/22/2022    2:46 PM  Alcohol Use Disorder Test (AUDIT)  1. How often do you have a drink containing alcohol? 0  2. How many drinks containing alcohol do you have on a typical day when you are drinking? 0  3. How often do you have six or more drinks on one occasion? 0  AUDIT-C Score 0   A score of 3 or more in women, and 4 or more in men indicates increased risk for alcohol abuse, EXCEPT if all of the points are from question 1   Vision/Hearing Screen: No results found.      No results found for any visits on 05/06/23.    Assessment & Plan   Annual wellness visit done today including the all of the following: Reviewed patient's Family Medical History Reviewed and updated list of patient's medical providers Assessment of cognitive impairment was done Assessed patient's functional ability Established a written schedule for health screening services Health Risk Assessent Completed and Reviewed  Exercise Activities and Dietary recommendations  Goals   None     Immunization History  Administered Date(s) Administered   Influenza-Unspecified 06/25/2014   Pneumococcal Conjugate-13 07/13/2014   Pneumococcal Polysaccharide-23 03/24/2008   Tdap 05/13/2021   Zoster, Live 06/26/2011, 08/05/2014  Health Maintenance  Topic Date Due   COVID-19 Vaccine (1) Never done   Zoster Vaccines- Shingrix (1 of 2) 03/06/1955   Medicare Annual Wellness (AWV)  12/07/2022   INFLUENZA VACCINE  09/23/2023 (Originally 01/24/2023)   HEMOGLOBIN A1C  08/21/2023   Diabetic kidney evaluation - eGFR measurement  02/18/2024   Diabetic kidney evaluation - Urine ACR  02/18/2024   FOOT EXAM  02/18/2024   OPHTHALMOLOGY EXAM  02/28/2024   DTaP/Tdap/Td (2 - Td or Tdap) 05/14/2031   Pneumonia Vaccine 36+ Years old  Completed   DEXA SCAN  Completed   HPV VACCINES  Aged Out     Discussed health benefits of physical activity, and encouraged her to engage in regular exercise appropriate for her age and condition.    Problem List Items Addressed This Visit   None   No follow-ups on file.     Windell Moment, MD

## 2023-05-13 ENCOUNTER — Other Ambulatory Visit: Payer: Self-pay | Admitting: Family Medicine

## 2023-06-04 ENCOUNTER — Other Ambulatory Visit: Payer: Self-pay | Admitting: Family Medicine

## 2023-06-04 DIAGNOSIS — E039 Hypothyroidism, unspecified: Secondary | ICD-10-CM

## 2023-06-05 ENCOUNTER — Other Ambulatory Visit: Payer: Self-pay | Admitting: Family Medicine

## 2023-06-05 DIAGNOSIS — I130 Hypertensive heart and chronic kidney disease with heart failure and stage 1 through stage 4 chronic kidney disease, or unspecified chronic kidney disease: Secondary | ICD-10-CM

## 2023-06-20 NOTE — Progress Notes (Unsigned)
Subjective:  Patient ID: Carol [name], female    DOB: [birth year]  Age: 87 y.o. MRN: [id number]  Chief Complaint  Patient presents with   Medical Management of Chronic Issues    HPI The patient, with a history of diabetes, neuropathy, and heart disease, presents with general malaise and high blood sugar. She reports feeling unwell since November and December, with a recent episode of illness characterized by nausea, chills, and heaving. She denies abdominal pain and other symptoms such as cough, congestion, or earaches. She also reports a recent flare-up of neuropathy in her left foot, which she attributes to running out of gabapentin.  Her blood sugar has been consistently high, often rising into the 200s. She notes that her blood sugar was high even when she had only consumed a slice of dry toast. She has been out of her Jardiance medication for some time, which may be contributing to her elevated blood sugar levels.  She also mentions a recent issue with her insurance and pharmacy, resulting in her receiving multiple bottles of medications she no longer takes. She is planning to switch insurance providers due to issues with her current provider, [employer].  She has been managing her heart disease with Dr. Katrinka Blazing and is currently on a regimen of Repatha shots every two weeks. She recently missed a dose due to a mishap with the injection. She also mentions a spot on her face that was previously removed by Dr. Mayford Knife, which has been checked and is not a cause for concern.   CORONARY ARTERY DISEASE/CONGESTIVE HEART FAILURE/hypertension/CKD stage 3b: Stents. 3 heart attacks. CABG. MCHG: Dr. Katrinka Blazing. On zetia 10 mg daily, plavix 75 mg daily, Imdur 60 mg daily, Carvedilol 6.25 mg one twice daily, and lasix 80 mg daily. Patient saw Dr. Katrinka Blazing. Echo 7/20. NTG Sl has with her.   Diabetes: Diabetes: neuropathy.  Last A1C was 6.7. Medications: Novolin 70/30 to 25 U before breakfast and tradjenta 5 mg  daily. Patient has been out of her Jardiance 25 mg due to patient going in the donut hole with her insurance. Glucose log:  120-250.  CGM: free style libre. Reviewed below.  Checking feet for sores.  Eats twice a day. Not eating her vegetables. Eating chicken breast, potatoes (air fry.) eating some fruit.    Constipation: resolved for the most part. Occasionally has to take miralax or linzess.  Neuropathy: takes tramadol 50 mg four times a day as needed severe pain. On gabapentin 300 mg twice daily. Ran out.   Hyperlipidemia: on zetia 10 mg daily, Intolerant to statins. On repatha, patient states that she has not been taking it consecutively due to one injector messing up and being off schedule.  GERD:  Takes pantoprazole 40 mg daily and uses famotidine otc as needed.   Gout:  patient is on allopurinol 300 mg daily and colchicine twice daily as needed if she has a flare. Takes for about 3 days.   Hypothyroidism: on levothyroxine 50 mcg once daily in am.       05/06/2023    2:23 PM 02/18/2023   10:56 AM 10/22/2022    2:45 PM 07/16/2022    9:59 AM 01/09/2022   10:18 AM  Depression screen PHQ 2/9  Decreased Interest 0 0 0 0 0  Down, Depressed, Hopeless 0 0 0 0 0  PHQ - 2 Score 0 0 0 0 0  Altered sleeping  0     Tired, decreased energy  0  Change in appetite  0     Feeling bad or failure about yourself   0     Trouble concentrating  0     Moving slowly or fidgety/restless  0     Suicidal thoughts  0     PHQ-9 Score  0     Difficult doing work/chores  Not difficult at all           05/06/2023    2:22 PM  Fall Risk   Falls in the past year? 1  Number falls in past yr: 1  Injury with Fall? 0  Follow up Falls evaluation completed    Patient Care Team: Blane Ohara, MD as PCP - General (Internal Medicine) Baldo Daub, MD as Consulting Physician (Cardiology) Regan Lemming, MD as Consulting Physician (Cardiology) [facility] [place]   Review of Systems   Constitutional:  Negative for appetite change, fatigue and fever.  HENT:  Positive for congestion. Negative for ear pain, sinus pressure and sore throat.   Respiratory:  Negative for cough, chest tightness, shortness of breath and wheezing.   Cardiovascular:  Negative for chest pain and palpitations.  Gastrointestinal:  Negative for abdominal pain, constipation, diarrhea, nausea and vomiting.  Genitourinary:  Negative for dysuria and hematuria.  Musculoskeletal:  Negative for arthralgias, back pain, joint swelling and myalgias.  Skin:  Negative for rash.  Neurological:  Positive for dizziness. Negative for weakness and headaches.  Psychiatric/Behavioral:  Negative for dysphoric mood. The patient is not nervous/anxious.     Current Outpatient Medications on File Prior to Visit  Medication Sig Dispense Refill   albuterol (VENTOLIN HFA) 108 (90 Base) MCG/ACT inhaler INHALE 2 PUFFS INTO THE LUNGS EVERY 6 (SIX) HOURS AS NEEDED FOR WHEEZING OR SHORTNESS OF BREATH. 4 each 3   Alcohol Swabs (ALCOHOL WIPES) 70 % PADS USE ONE PAD TO CLEAN AREA TO CHECK BLOOD SUGAR AS  DIRECTED     allopurinol (ZYLOPRIM) 300 MG tablet Take 1 tablet (300 mg total) by mouth every morning. 90 tablet 1   Biotin 5000 MCG CAPS Take 5,000 mcg by mouth every morning.     Blood Glucose Monitoring Suppl (TRUE METRIX METER) DEVI Use to test blood sugars once daily. ICD: E11.65     calcium carbonate (OSCAL) 1500 (600 Ca) MG TABS tablet Take 600 mg by mouth 2 (two) times daily with a meal.     carvedilol (COREG) 6.25 MG tablet TAKE 1 TABLET TWICE DAILY WITH MEALS 180 tablet 1   clopidogrel (PLAVIX) 75 MG tablet TAKE 1 TABLET EVERY DAY 90 tablet 3   Continuous Blood Gluc Receiver (FREESTYLE LIBRE READER) DEVI 1 each by Does not apply route as directed. 1 each 0   Evolocumab (REPATHA SURECLICK) 140 MG/ML SOAJ Inject 140 mg into the skin every 14 (fourteen) days. 2 mL 11   ezetimibe (ZETIA) 10 MG tablet TAKE 1 TABLET EVERY DAY 90  tablet 3   famotidine (PEPCID) 10 MG tablet Take 10 mg by mouth 2 (two) times daily as needed for heartburn or indigestion.      fluticasone (FLONASE) 50 MCG/ACT nasal spray USE 1 SPRAY NASALLY EVERY DAY     furosemide (LASIX) 80 MG tablet TAKE 1 TABLET EVERY DAY (Patient taking differently: Take 80 mg by mouth as needed.) 90 tablet 3   glucose blood (TRUE METRIX BLOOD GLUCOSE TEST) test strip Use as instructed 100 each 12   insulin NPH-regular Human (NOVOLIN 70/30) (70-30) 100 UNIT/ML injection Inject 25-32 Units  into the skin daily. Reports 20 units in morning and 10-12 at night if needed.     isosorbide mononitrate (IMDUR) 30 MG 24 hr tablet Take 0.5 tablets (15 mg total) by mouth daily. 45 tablet 3   levothyroxine (SYNTHROID, LEVOTHROID) 50 MCG tablet Take 50 mcg by mouth every morning.      methocarbamol (ROBAXIN) 500 MG tablet Take 1-2 tablets (500-1,000 mg total) by mouth every 6 (six) hours as needed for muscle spasms. 60 tablet 0   nitroGLYCERIN (NITROSTAT) 0.4 MG SL tablet DISSOLVE 1 TABLET UNDER THE TONGUE EVERY 5 MINUTES AS NEEDED FOR CHEST PAIN UP TO 3 TIMES. CALL EMS IF YOU NEED A SECOND. 25 tablet 11   pantoprazole (PROTONIX) 40 MG tablet TAKE 1 TABLET EVERY DAY 90 tablet 3   potassium chloride (KLOR-CON) 10 MEQ tablet Take 10 mEq by mouth as needed. To take one (1) tablet if you take extra lasix for weight gain.     ranolazine (RANEXA) 500 MG 12 hr tablet TAKE 1 TABLET TWICE DAILY 180 tablet 1   TRADJENTA 5 MG TABS tablet TAKE 1 TABLET EVERY DAY 90 tablet 3   TRUEplus Lancets 33G MISC Use to test blood sugar once daily. ICD: E11.65     valACYclovir (VALTREX) 500 MG tablet Take 1 tablet (500 mg total) by mouth 3 (three) times daily. 21 tablet 0   colchicine 0.6 MG tablet Take 1 tablet (0.6 mg total) by mouth 2 (two) times daily as needed (gout flare up.). 30 tablet 1   empagliflozin (JARDIANCE) 25 MG TABS tablet Take 1 tablet (25 mg total) by mouth daily before breakfast. (Patient not  taking: Reported on 06/21/2023) 90 tablet 3   No current facility-administered medications on file prior to visit.   Past Medical History:  Diagnosis Date   Anemia    a. Noted on labs 02/2014 possibly procedurally related.   Asthma    CAD S/P percutaneous coronary angioplasty 01/22/2014   A. (12/2013): POBA - OM2 99% (very tortuous segment) - reduced ~ 50% & TIMI 3 flow, 80-90% stenosis in mid PDA, Irregularities <50% in mRCA, pLAD and prox LCx; b. 02/17/14: Moderate sized fixed defect in Lateral wall --> cath with OM2 restenosis & otherwise stable --> Xience Alpine DES x 2 (2.25 mm x 12 & 8 mm). c. NSTEMI 02/2014 s/p PTCA/balloon angioplasty only to small caliber PDA.   Carotid stenosis    Carotid US 11/17: L 1-39; FU prn   Chronic systolic heart failure (HCC)    a. ICM b. ECHO (01/2014): EF 25-30%, grade I DD, trivial MR. c. EF by Myoview 02/17/14 = ~53%. d. EF by cath 02/24/14 - improved to 50-55%.   CKD (chronic kidney disease), stage III (HCC)    Contrast media allergy    Environmental allergies    GERD (gastroesophageal reflux disease)    Gout, unspecified    HOH (hard of hearing)    Hyperlipidemia    a. H/o intolerance to Zocor, Lipitor. Had muscle aches on higher dose Crestor.   Hypertension    Hypothyroidism    LBBB (left bundle branch block)    a. Transient during 02/2014 admission.   NSTEMI (non-ST elevated myocardial infarction) (HCC) 2014; 12/2013, 01/2014   QT prolongation    a. Noted on EKG 02/2014.   Sinus bradycardia    a. HR 40s-50s during 02/2014 admission, limiting BB dose.   TIA (transient ischemic attack) 1990's   a. TIA vs stroke 1990's "mild" - sounds like  a TIA as she was told she had stroke symptoms but negative scans. Denies residual effects.   Type II diabetes mellitus [facility])    Past Surgical History:  Procedure Laterality Date   ABDOMINAL HYSTERECTOMY     complete   AMPUTATION TOE Right 02/25/2021   hammer toe.   APPENDECTOMY  1959   CARDIAC CATHETERIZATION   02/24/2014   Procedure: CORONARY BALLOON ANGIOPLASTY;  Surgeon: Kathleene Hazel, MD;  Location: [facility] CATH LAB;  Service: Cardiovascular;;   CARDIAC CATHETERIZATION N/A 04/20/2015   Procedure: Left Heart Cath and Coronary Angiography;  Surgeon: Lyn Records, MD;  Location: [facility] INVASIVE CV LAB;  Service: Cardiovascular;  Laterality: N/A;   CATARACT EXTRACTION W/ INTRAOCULAR LENS  IMPLANT, BILATERAL Bilateral 2013   CESAREAN SECTION  1959; 1960   CHOLECYSTECTOMY  1989   CORONARY ANGIOPLASTY  12/2013   POBA - OM2   CORONARY ARTERY BYPASS GRAFT N/A 04/25/2015   Procedure: CORONARY ARTERY BYPASS GRAFTING (CABG) x four,  using left internal mammary artery and right leg greater saphenous vein harvested endoscopically;  Surgeon: Alleen Borne, MD;  Location: [facility] OR;  Service: Open Heart Surgery;  Laterality: N/A;   JOINT REPLACEMENT Right 10 years ago    right knee (TKR)   LEFT HEART CATHETERIZATION WITH CORONARY ANGIOGRAM N/A 01/22/2014   Procedure: LEFT HEART CATHETERIZATION WITH CORONARY ANGIOGRAM;  Surgeon: Lesleigh Noe, MD;  Location: [facility] CATH LAB;  Service: Cardiovascular;  Laterality: N/A;   LEFT HEART CATHETERIZATION WITH CORONARY ANGIOGRAM N/A 02/17/2014   Procedure: LEFT HEART CATHETERIZATION WITH CORONARY ANGIOGRAM;  Surgeon: Kathleene Hazel, MD;  Location: [facility] CATH LAB;  Service: Cardiovascular;  Laterality: N/A;   LEFT HEART CATHETERIZATION WITH CORONARY ANGIOGRAM N/A 02/24/2014   Procedure: LEFT HEART CATHETERIZATION WITH CORONARY ANGIOGRAM;  Surgeon: Kathleene Hazel, MD;  Location: [facility] CATH LAB;  Service: Cardiovascular;  Laterality: N/A;   LUMBAR LAMINECTOMY/DECOMPRESSION MICRODISCECTOMY Left 12/22/2014   Procedure: CENTRAL DECOMPRESSIVE LUMBAR, AND LAMINECTOMY L5-S1,  S1-S2 FOR SPINAL STENOSIS FORAMINOTOMY L5, S1, S2 ROOTS ON LEFT FOR FORAMINAL STENOSIS, AND DECOMPRESSION L5-S1, S1-S2 ACCORDING TO LABELING OF [facility] X-RAYS;  Surgeon: Ranee Gosselin, MD;   Location: [facility] ORS;  Service: Orthopedics;  Laterality: Left;   PERCUTANEOUS CORONARY STENT INTERVENTION (PCI-S)  02/17/2014   For restenosis of OM2 - PCI with Xience Apline DES 2.25 mm x 12 mm & 2.25 mm x 8 mm overlapping   REDUCTION MAMMAPLASTY Bilateral 1990's   SHOULDER OPEN ROTATOR CUFF REPAIR Bilateral 1980's - 1990's   TEE WITHOUT CARDIOVERSION N/A 04/25/2015   Procedure: TRANSESOPHAGEAL ECHOCARDIOGRAM (TEE);  Surgeon: Alleen Borne, MD;  Location: [facility] OR;  Service: Open Heart Surgery;  Laterality: N/A;   TOTAL KNEE ARTHROPLASTY Right 2009   TOTAL KNEE ARTHROPLASTY Left 04/21/2018   TKR;  Surgeon: Dannielle Huh, MD;  Location: [facility] ORS;  Service: Orthopedics;  Laterality: Left;  Adductor Block    Family History  Problem Relation Age of Onset   Hypertension Mother    Diabetes Mother    Heart attack Mother 72   Heart disease Father    Emphysema Father    Diabetes Brother    Heart disease Brother    Cancer Sister    Stroke Neg Hx    Social History   Socioeconomic History   Marital status: Widowed    Spouse name: Not on file   Number of children: 2   Years of education: Not on file   Highest education level:  Not on file  Occupational History   Occupation: Retired  Tobacco Use   Smoking status: Never   Smokeless tobacco: Never  Vaping Use   Vaping status: Never Used  Substance and Sexual Activity   Alcohol use: No   Drug use: No   Sexual activity: Never  Other Topics Concern   Not on file  Social History Narrative   Lives in [place] by herself.    Social Drivers of Corporate investment banker Strain: Low Risk  (10/22/2022)   Overall Financial Resource Strain (CARDIA)    Difficulty of Paying Living Expenses: Not hard at all  Food Insecurity: No Food Insecurity (10/22/2022)   Hunger Vital Sign    Worried About Running Out of Food in the Last Year: Never true    Ran Out of Food in the Last Year: Never true  Transportation Needs: No Transportation Needs (10/22/2022)    PRAPARE - Administrator, Civil Service (Medical): No    Lack of Transportation (Non-Medical): No  Physical Activity: Sufficiently Active (10/22/2022)   Exercise Vital Sign    Days of Exercise per Week: 6 days    Minutes of Exercise per Session: 40 min  Stress: No Stress Concern Present (10/22/2022)   [employer] of Occupational Health - Occupational Stress Questionnaire    Feeling of Stress : Not at all  Social Connections: Moderately Isolated (10/22/2022)   Social Connection and Isolation Panel [NHANES]    Frequency of Communication with Friends and Family: Three times a week    Frequency of Social Gatherings with Friends and Family: Three times a week    Attends Religious Services: More than 4 times per year    Active Member of Clubs or Organizations: No    Attends Banker Meetings: Never    Marital Status: Widowed    Objective:  BP 132/78 (BP Location: Left Arm, Patient Position: Sitting)   Pulse 85   Temp (!) 97.5 F (36.4 C) (Temporal)   Ht 5' 7.5" (1.715 m)   Wt 189 lb (85.7 kg)   SpO2 98%   BMI 29.16 kg/m      06/21/2023    9:00 AM 03/29/2023    9:10 AM 03/04/2023    9:22 AM  BP/Weight  Systolic BP 132 148 160  Diastolic BP 78 60 72  Wt. (Lbs) 189 194   BMI 29.16 kg/m2 29.94 kg/m2     Physical Exam Vitals reviewed.  Constitutional:      Appearance: Normal appearance. She is obese.  Neck:     Vascular: No carotid bruit.  Cardiovascular:     Rate and Rhythm: Normal rate and regular rhythm.     Heart sounds: Normal heart sounds.  Pulmonary:     Effort: Pulmonary effort is normal. No respiratory distress.     Breath sounds: Normal breath sounds.  Abdominal:     General: Abdomen is flat. Bowel sounds are normal.     Palpations: Abdomen is soft.     Tenderness: There is no abdominal tenderness.  Neurological:     Mental Status: She is alert and oriented to person, place, and time.  Psychiatric:        Mood and Affect: Mood  normal.        Behavior: Behavior normal.     Diabetic Foot Exam - Simple   Simple Foot Form Diabetic Foot exam was performed with the following findings: Yes 06/21/2023  9:46 AM  Visual Inspection See comments: Yes  Sensation Testing See comments: Yes Pulse Check Posterior Tibialis and Dorsalis pulse intact bilaterally: Yes Comments Decreased sensation BL feet.  Large Bunions BL.        Lab Results  Component Value Date   WBC 6.5 06/21/2023   HGB 12.6 06/21/2023   HCT 37.7 06/21/2023   PLT 203 06/21/2023   GLUCOSE 116 (H) 06/21/2023   CHOL 181 06/21/2023   TRIG 228 (H) 06/21/2023   HDL 37 (L) 06/21/2023   LDLCALC 105 (H) 06/21/2023   ALT 10 06/21/2023   AST 13 06/21/2023   NA 141 06/21/2023   K 4.3 06/21/2023   CL 99 06/21/2023   CREATININE 1.78 (H) 06/21/2023   BUN 40 (H) 06/21/2023   CO2 26 06/21/2023   TSH 2.750 02/18/2023   INR 0.94 04/21/2018   HGBA1C 7.9 (H) 06/21/2023   MICROALBUR 2.1 (H) 07/26/2014      Assessment & Plan:    Hypertensive heart and renal disease with renal failure, stage 1 through stage 4 or unspecified chronic kidney disease, with heart failure (HCC) Assessment & Plan: Well controlled.  No changes to medicines. Imdur 60 mg daily, Carvedilol 6.25 mg one twice daily, and lasix 80 mg daily.and ranexa 500 mg twice daily.  Continue to work on eating a healthy diet and exercise.  Labs drawn today.    Orders: -     CBC with Differential/Platelet -     Comprehensive metabolic panel  Acquired hypothyroidism Assessment & Plan: Previously well controlled Continue Synthroid at current dose      Mixed hyperlipidemia Assessment & Plan: Well controlled.  Statin intolerant.  Continue zetia and repatha. Labs ordered.   Orders: -     Lipid panel  Type 2 diabetes mellitus with diabetic neuropathy, with long-term current use of insulin (HCC) Assessment & Plan: Control: worsened Recommend check feet daily. Recommend annual eye  exams. Medicines: Jardiance 25 mg daily samples given. Continue Novolin 70/30 to 25 U before breakfast and tradjenta 5 mg daily. Continue to work on eating a healthy diet and exercise.  Labs drawn today.     Orders: -     Hemoglobin A1c -     Gabapentin; Take 1 capsule (300 mg total) by mouth 3 (three) times daily as needed.  Dispense: 90 capsule; Refill: 1 -     Microalbumin / creatinine urine ratio  Acute cystitis without hematuria Assessment & Plan: Check UA Order Urine culture Sent Bactrim  Orders: -     Sulfamethoxazole-Trimethoprim; Take 1 tablet by mouth 2 (two) times daily.  Dispense: 14 tablet; Refill: 0 -     POCT URINALYSIS DIP (CLINITEK) -     Urine Culture  Chronic gout without tophus, unspecified cause, unspecified site Assessment & Plan: The current medical regimen is effective;  continue present plan and medications.  Continue allopurinol 300 mg daily.    Malaise Assessment & Plan: Check labs.      Meds ordered this encounter  Medications   gabapentin (NEURONTIN) 300 MG capsule    Sig: Take 1 capsule (300 mg total) by mouth 3 (three) times daily as needed.    Dispense:  90 capsule    Refill:  1   sulfamethoxazole-trimethoprim (BACTRIM DS) 800-160 MG tablet    Sig: Take 1 tablet by mouth 2 (two) times daily.    Dispense:  14 tablet    Refill:  0    Orders Placed This Encounter  Procedures   Urine Culture   CBC with Differential/Platelet  Comprehensive metabolic panel   Hemoglobin A1c   Lipid panel   Microalbumin / creatinine urine ratio   POCT URINALYSIS DIP (CLINITEK)     Follow-up: Return in about 3 months (around 09/19/2023) for chronic follow up, 40 minutes please.   I,Marla I Leal-Borjas,acting as a scribe for Blane Ohara, MD.,have documented all relevant documentation on the behalf of Blane Ohara, MD,as directed by  Blane Ohara, MD while in the presence of Blane Ohara, MD.   An After Visit Summary was printed and given to the  patient.  Blane Ohara, MD [facility] [phone]

## 2023-06-21 ENCOUNTER — Encounter: Payer: Self-pay | Admitting: Family Medicine

## 2023-06-21 ENCOUNTER — Ambulatory Visit: Payer: Medicare HMO | Admitting: Family Medicine

## 2023-06-21 VITALS — BP 132/78 | HR 85 | Temp 97.5°F | Ht 67.5 in | Wt 189.0 lb

## 2023-06-21 DIAGNOSIS — R5381 Other malaise: Secondary | ICD-10-CM

## 2023-06-21 DIAGNOSIS — E782 Mixed hyperlipidemia: Secondary | ICD-10-CM | POA: Diagnosis not present

## 2023-06-21 DIAGNOSIS — M1A9XX0 Chronic gout, unspecified, without tophus (tophi): Secondary | ICD-10-CM

## 2023-06-21 DIAGNOSIS — Z794 Long term (current) use of insulin: Secondary | ICD-10-CM | POA: Diagnosis not present

## 2023-06-21 DIAGNOSIS — E1140 Type 2 diabetes mellitus with diabetic neuropathy, unspecified: Secondary | ICD-10-CM

## 2023-06-21 DIAGNOSIS — N3000 Acute cystitis without hematuria: Secondary | ICD-10-CM | POA: Diagnosis not present

## 2023-06-21 DIAGNOSIS — I130 Hypertensive heart and chronic kidney disease with heart failure and stage 1 through stage 4 chronic kidney disease, or unspecified chronic kidney disease: Secondary | ICD-10-CM

## 2023-06-21 DIAGNOSIS — N1832 Chronic kidney disease, stage 3b: Secondary | ICD-10-CM

## 2023-06-21 DIAGNOSIS — E039 Hypothyroidism, unspecified: Secondary | ICD-10-CM | POA: Diagnosis not present

## 2023-06-21 LAB — POCT URINALYSIS DIP (CLINITEK)
Bilirubin, UA: NEGATIVE
Blood, UA: NEGATIVE
Glucose, UA: NEGATIVE mg/dL
Ketones, POC UA: NEGATIVE mg/dL
Nitrite, UA: NEGATIVE
Spec Grav, UA: 1.015 (ref 1.010–1.025)
Urobilinogen, UA: 0.2 U/dL
pH, UA: 6 (ref 5.0–8.0)

## 2023-06-21 MED ORDER — GABAPENTIN 300 MG PO CAPS: 300.0000 mg | ORAL_CAPSULE | Freq: Three times a day (TID) | ORAL | 1 refills | Status: DC | PRN

## 2023-06-21 MED ORDER — SULFAMETHOXAZOLE-TRIMETHOPRIM 800-160 MG PO TABS: 1.0000 | ORAL_TABLET | Freq: Two times a day (BID) | ORAL | 0 refills | Status: DC

## 2023-06-22 DIAGNOSIS — R5381 Other malaise: Secondary | ICD-10-CM | POA: Insufficient documentation

## 2023-06-22 LAB — LIPID PANEL
Chol/HDL Ratio: 4.9 {ratio} — ABNORMAL HIGH (ref 0.0–4.4)
Cholesterol, Total: 181 mg/dL (ref 100–199)
HDL: 37 mg/dL — ABNORMAL LOW (ref >39)
LDL Chol Calc (NIH): 105 mg/dL — ABNORMAL HIGH (ref 0–99)
Triglycerides: 228 mg/dL — ABNORMAL HIGH (ref 0–149)
VLDL Cholesterol Cal: 39 mg/dL (ref 5–40)

## 2023-06-22 LAB — CBC WITH DIFFERENTIAL/PLATELET
Basophils Absolute: 0 10*3/uL (ref 0.0–0.2)
Basos: 1 %
EOS (ABSOLUTE): 0 10*3/uL (ref 0.0–0.4)
Eos: 1 %
Hematocrit: 37.7 % (ref 34.0–46.6)
Hemoglobin: 12.6 g/dL (ref 11.1–15.9)
Immature Grans (Abs): 0 10*3/uL (ref 0.0–0.1)
Immature Granulocytes: 1 %
Lymphocytes Absolute: 1.5 10*3/uL (ref 0.7–3.1)
Lymphs: 23 %
MCH: 31.6 pg (ref 26.6–33.0)
MCHC: 33.4 g/dL (ref 31.5–35.7)
MCV: 95 fL (ref 79–97)
Monocytes Absolute: 0.7 10*3/uL (ref 0.1–0.9)
Monocytes: 11 %
Neutrophils Absolute: 4.2 10*3/uL (ref 1.4–7.0)
Neutrophils: 63 %
Platelets: 203 10*3/uL (ref 150–450)
RBC: 3.99 x10E6/uL (ref 3.77–5.28)
RDW: 13 % (ref 11.7–15.4)
WBC: 6.5 10*3/uL (ref 3.4–10.8)

## 2023-06-22 LAB — COMPREHENSIVE METABOLIC PANEL
ALT: 10 [IU]/L (ref 0–32)
AST: 13 [IU]/L (ref 0–40)
Albumin: 4.3 g/dL (ref 3.7–4.7)
Alkaline Phosphatase: 161 [IU]/L — ABNORMAL HIGH (ref 44–121)
BUN/Creatinine Ratio: 22 (ref 12–28)
BUN: 40 mg/dL — ABNORMAL HIGH (ref 8–27)
Bilirubin Total: 0.4 mg/dL (ref 0.0–1.2)
CO2: 26 mmol/L (ref 20–29)
Calcium: 9.6 mg/dL (ref 8.7–10.3)
Chloride: 99 mmol/L (ref 96–106)
Creatinine, Ser: 1.78 mg/dL — ABNORMAL HIGH (ref 0.57–1.00)
Globulin, Total: 2.5 g/dL (ref 1.5–4.5)
Glucose: 116 mg/dL — ABNORMAL HIGH (ref 70–99)
Potassium: 4.3 mmol/L (ref 3.5–5.2)
Sodium: 141 mmol/L (ref 134–144)
Total Protein: 6.8 g/dL (ref 6.0–8.5)
eGFR: 28 mL/min/{1.73_m2} — ABNORMAL LOW (ref >59)

## 2023-06-22 LAB — HEMOGLOBIN A1C
Est. average glucose Bld gHb Est-mCnc: 180 mg/dL
Hgb A1c MFr Bld: 7.9 % — ABNORMAL HIGH (ref 4.8–5.6)

## 2023-06-22 LAB — MICROALBUMIN / CREATININE URINE RATIO
Creatinine, Urine: 115.6 mg/dL
Microalb/Creat Ratio: 139 mg/g{creat} — ABNORMAL HIGH (ref 0–29)
Microalbumin, Urine: 160.3 ug/mL

## 2023-06-22 NOTE — Assessment & Plan Note (Signed)
Well controlled.  No changes to medicines. Imdur 60 mg daily, Carvedilol 6.25 mg one twice daily, and lasix 80 mg daily.and ranexa 500 mg twice daily.  Continue to work on eating a healthy diet and exercise.  Labs drawn today.

## 2023-06-22 NOTE — Assessment & Plan Note (Signed)
Previously well controlled Continue Synthroid at current dose  

## 2023-06-22 NOTE — Assessment & Plan Note (Signed)
Well controlled.  Statin intolerant.  Continue zetia and repatha. Labs ordered.

## 2023-06-22 NOTE — Assessment & Plan Note (Signed)
Check UA Order Urine culture Sent Bactrim

## 2023-06-22 NOTE — Assessment & Plan Note (Addendum)
Control: worsened Recommend check feet daily. Recommend annual eye exams. Medicines: Jardiance 25 mg daily samples given. Continue Novolin 70/30 to 25 U before breakfast and tradjenta 5 mg daily. Continue to work on eating a healthy diet and exercise.  Labs drawn today.

## 2023-06-22 NOTE — Assessment & Plan Note (Signed)
Check labs 

## 2023-06-22 NOTE — Assessment & Plan Note (Signed)
The current medical regimen is effective;  continue present plan and medications. Continue allopurinol 300 mg daily.  

## 2023-06-25 LAB — URINE CULTURE

## 2023-07-04 ENCOUNTER — Inpatient Hospital Stay (HOSPITAL_COMMUNITY)
Admission: EM | Admit: 2023-07-04 | Discharge: 2023-07-08 | DRG: 312 | Disposition: A | Discharge status: Home Health | Payer: Medicare HMO | Attending: Internal Medicine | Admitting: Internal Medicine

## 2023-07-04 ENCOUNTER — Emergency Department (HOSPITAL_COMMUNITY): Payer: Medicare HMO

## 2023-07-04 ENCOUNTER — Other Ambulatory Visit: Payer: Self-pay | Admitting: Family Medicine

## 2023-07-04 ENCOUNTER — Other Ambulatory Visit: Payer: Self-pay

## 2023-07-04 ENCOUNTER — Encounter (HOSPITAL_COMMUNITY): Payer: Self-pay

## 2023-07-04 DIAGNOSIS — Z8249 Family history of ischemic heart disease and other diseases of the circulatory system: Secondary | ICD-10-CM

## 2023-07-04 DIAGNOSIS — I440 Atrioventricular block, first degree: Secondary | ICD-10-CM | POA: Diagnosis present

## 2023-07-04 DIAGNOSIS — M545 Low back pain, unspecified: Secondary | ICD-10-CM | POA: Diagnosis not present

## 2023-07-04 DIAGNOSIS — N179 Acute kidney failure, unspecified: Secondary | ICD-10-CM

## 2023-07-04 DIAGNOSIS — W19XXXA Unspecified fall, initial encounter: Secondary | ICD-10-CM | POA: Diagnosis not present

## 2023-07-04 DIAGNOSIS — I951 Orthostatic hypotension: Secondary | ICD-10-CM | POA: Diagnosis not present

## 2023-07-04 DIAGNOSIS — Z79899 Other long term (current) drug therapy: Secondary | ICD-10-CM | POA: Diagnosis not present

## 2023-07-04 DIAGNOSIS — Z1152 Encounter for screening for COVID-19: Secondary | ICD-10-CM | POA: Diagnosis not present

## 2023-07-04 DIAGNOSIS — Z79890 Hormone replacement therapy (postmenopausal): Secondary | ICD-10-CM

## 2023-07-04 DIAGNOSIS — Z794 Long term (current) use of insulin: Secondary | ICD-10-CM

## 2023-07-04 DIAGNOSIS — G238 Other specified degenerative diseases of basal ganglia: Secondary | ICD-10-CM | POA: Diagnosis not present

## 2023-07-04 DIAGNOSIS — H9190 Unspecified hearing loss, unspecified ear: Secondary | ICD-10-CM | POA: Diagnosis present

## 2023-07-04 DIAGNOSIS — Z96653 Presence of artificial knee joint, bilateral: Secondary | ICD-10-CM | POA: Diagnosis present

## 2023-07-04 DIAGNOSIS — J45909 Unspecified asthma, uncomplicated: Secondary | ICD-10-CM | POA: Diagnosis present

## 2023-07-04 DIAGNOSIS — I447 Left bundle-branch block, unspecified: Secondary | ICD-10-CM | POA: Diagnosis present

## 2023-07-04 DIAGNOSIS — Z89421 Acquired absence of other right toe(s): Secondary | ICD-10-CM

## 2023-07-04 DIAGNOSIS — E860 Dehydration: Secondary | ICD-10-CM | POA: Diagnosis present

## 2023-07-04 DIAGNOSIS — N261 Atrophy of kidney (terminal): Secondary | ICD-10-CM | POA: Diagnosis not present

## 2023-07-04 DIAGNOSIS — E1122 Type 2 diabetes mellitus with diabetic chronic kidney disease: Secondary | ICD-10-CM

## 2023-07-04 DIAGNOSIS — R110 Nausea: Secondary | ICD-10-CM | POA: Diagnosis present

## 2023-07-04 DIAGNOSIS — K449 Diaphragmatic hernia without obstruction or gangrene: Secondary | ICD-10-CM | POA: Diagnosis not present

## 2023-07-04 DIAGNOSIS — I5032 Chronic diastolic (congestive) heart failure: Secondary | ICD-10-CM

## 2023-07-04 DIAGNOSIS — N189 Chronic kidney disease, unspecified: Secondary | ICD-10-CM | POA: Diagnosis not present

## 2023-07-04 DIAGNOSIS — K219 Gastro-esophageal reflux disease without esophagitis: Secondary | ICD-10-CM | POA: Diagnosis present

## 2023-07-04 DIAGNOSIS — I130 Hypertensive heart and chronic kidney disease with heart failure and stage 1 through stage 4 chronic kidney disease, or unspecified chronic kidney disease: Secondary | ICD-10-CM | POA: Diagnosis present

## 2023-07-04 DIAGNOSIS — I252 Old myocardial infarction: Secondary | ICD-10-CM

## 2023-07-04 DIAGNOSIS — E785 Hyperlipidemia, unspecified: Secondary | ICD-10-CM | POA: Diagnosis present

## 2023-07-04 DIAGNOSIS — I5022 Chronic systolic (congestive) heart failure: Secondary | ICD-10-CM | POA: Diagnosis present

## 2023-07-04 DIAGNOSIS — D72829 Elevated white blood cell count, unspecified: Secondary | ICD-10-CM | POA: Diagnosis not present

## 2023-07-04 DIAGNOSIS — S3993XA Unspecified injury of pelvis, initial encounter: Secondary | ICD-10-CM | POA: Diagnosis not present

## 2023-07-04 DIAGNOSIS — Z66 Do not resuscitate: Secondary | ICD-10-CM | POA: Diagnosis present

## 2023-07-04 DIAGNOSIS — N184 Chronic kidney disease, stage 4 (severe): Secondary | ICD-10-CM | POA: Diagnosis not present

## 2023-07-04 DIAGNOSIS — Z7902 Long term (current) use of antithrombotics/antiplatelets: Secondary | ICD-10-CM

## 2023-07-04 DIAGNOSIS — Z825 Family history of asthma and other chronic lower respiratory diseases: Secondary | ICD-10-CM

## 2023-07-04 DIAGNOSIS — R102 Pelvic and perineal pain: Secondary | ICD-10-CM | POA: Diagnosis not present

## 2023-07-04 DIAGNOSIS — E039 Hypothyroidism, unspecified: Secondary | ICD-10-CM | POA: Diagnosis present

## 2023-07-04 DIAGNOSIS — D631 Anemia in chronic kidney disease: Secondary | ICD-10-CM | POA: Diagnosis present

## 2023-07-04 DIAGNOSIS — Z955 Presence of coronary angioplasty implant and graft: Secondary | ICD-10-CM

## 2023-07-04 DIAGNOSIS — Z90710 Acquired absence of both cervix and uterus: Secondary | ICD-10-CM

## 2023-07-04 DIAGNOSIS — R55 Syncope and collapse: Secondary | ICD-10-CM | POA: Diagnosis not present

## 2023-07-04 DIAGNOSIS — K5730 Diverticulosis of large intestine without perforation or abscess without bleeding: Secondary | ICD-10-CM | POA: Diagnosis not present

## 2023-07-04 DIAGNOSIS — I2510 Atherosclerotic heart disease of native coronary artery without angina pectoris: Secondary | ICD-10-CM | POA: Diagnosis present

## 2023-07-04 DIAGNOSIS — E1165 Type 2 diabetes mellitus with hyperglycemia: Secondary | ICD-10-CM | POA: Diagnosis present

## 2023-07-04 DIAGNOSIS — J320 Chronic maxillary sinusitis: Secondary | ICD-10-CM | POA: Diagnosis not present

## 2023-07-04 DIAGNOSIS — Z7984 Long term (current) use of oral hypoglycemic drugs: Secondary | ICD-10-CM

## 2023-07-04 DIAGNOSIS — E1143 Type 2 diabetes mellitus with diabetic autonomic (poly)neuropathy: Secondary | ICD-10-CM | POA: Diagnosis present

## 2023-07-04 DIAGNOSIS — R6889 Other general symptoms and signs: Secondary | ICD-10-CM | POA: Diagnosis not present

## 2023-07-04 DIAGNOSIS — Z951 Presence of aortocoronary bypass graft: Secondary | ICD-10-CM

## 2023-07-04 DIAGNOSIS — R54 Age-related physical debility: Secondary | ICD-10-CM | POA: Diagnosis present

## 2023-07-04 DIAGNOSIS — M47814 Spondylosis without myelopathy or radiculopathy, thoracic region: Secondary | ICD-10-CM | POA: Diagnosis not present

## 2023-07-04 DIAGNOSIS — Z471 Aftercare following joint replacement surgery: Secondary | ICD-10-CM | POA: Diagnosis not present

## 2023-07-04 DIAGNOSIS — Z833 Family history of diabetes mellitus: Secondary | ICD-10-CM

## 2023-07-04 DIAGNOSIS — M160 Bilateral primary osteoarthritis of hip: Secondary | ICD-10-CM | POA: Diagnosis not present

## 2023-07-04 DIAGNOSIS — E1140 Type 2 diabetes mellitus with diabetic neuropathy, unspecified: Secondary | ICD-10-CM

## 2023-07-04 DIAGNOSIS — I639 Cerebral infarction, unspecified: Secondary | ICD-10-CM | POA: Diagnosis not present

## 2023-07-04 DIAGNOSIS — M109 Gout, unspecified: Secondary | ICD-10-CM | POA: Diagnosis present

## 2023-07-04 DIAGNOSIS — M549 Dorsalgia, unspecified: Secondary | ICD-10-CM | POA: Diagnosis not present

## 2023-07-04 LAB — COMPREHENSIVE METABOLIC PANEL
ALT: 18 U/L (ref 0–44)
AST: 28 U/L (ref 15–41)
Albumin: 4 g/dL (ref 3.5–5.0)
Alkaline Phosphatase: 126 U/L (ref 38–126)
Anion gap: 14 (ref 5–15)
BUN: 51 mg/dL — ABNORMAL HIGH (ref 8–23)
CO2: 27 mmol/L (ref 22–32)
Calcium: 10 mg/dL (ref 8.9–10.3)
Chloride: 97 mmol/L — ABNORMAL LOW (ref 98–111)
Creatinine, Ser: 2.25 mg/dL — ABNORMAL HIGH (ref 0.44–1.00)
GFR, Estimated: 21 mL/min — ABNORMAL LOW (ref >60)
Glucose, Bld: 146 mg/dL — ABNORMAL HIGH (ref 70–99)
Potassium: 4.6 mmol/L (ref 3.5–5.1)
Sodium: 138 mmol/L (ref 135–145)
Total Bilirubin: 0.9 mg/dL (ref 0.0–1.2)
Total Protein: 7.3 g/dL (ref 6.5–8.1)

## 2023-07-04 LAB — URINALYSIS, W/ REFLEX TO CULTURE (INFECTION SUSPECTED)
Bilirubin Urine: NEGATIVE
Glucose, UA: >=500 mg/dL — AB
Hgb urine dipstick: NEGATIVE
Ketones, ur: NEGATIVE mg/dL
Nitrite: NEGATIVE
Protein, ur: NEGATIVE mg/dL
Specific Gravity, Urine: 1.008 (ref 1.005–1.030)
pH: 5 (ref 5.0–8.0)

## 2023-07-04 LAB — CBC WITH DIFFERENTIAL/PLATELET
Abs Immature Granulocytes: 0.17 10*3/uL — ABNORMAL HIGH (ref 0.00–0.07)
Basophils Absolute: 0.1 10*3/uL (ref 0.0–0.1)
Basophils Relative: 0 %
Eosinophils Absolute: 0 10*3/uL (ref 0.0–0.5)
Eosinophils Relative: 0 %
HCT: 41.3 % (ref 36.0–46.0)
Hemoglobin: 13.5 g/dL (ref 12.0–15.0)
Immature Granulocytes: 1 %
Lymphocytes Relative: 7 %
Lymphs Abs: 1.3 10*3/uL (ref 0.7–4.0)
MCH: 31.5 pg (ref 26.0–34.0)
MCHC: 32.7 g/dL (ref 30.0–36.0)
MCV: 96.3 fL (ref 80.0–100.0)
Monocytes Absolute: 0.9 10*3/uL (ref 0.1–1.0)
Monocytes Relative: 5 %
Neutro Abs: 15 10*3/uL — ABNORMAL HIGH (ref 1.7–7.7)
Neutrophils Relative %: 87 %
Platelets: 265 10*3/uL (ref 150–400)
RBC: 4.29 MIL/uL (ref 3.87–5.11)
RDW: 14.8 % (ref 11.5–15.5)
WBC: 17.4 10*3/uL — ABNORMAL HIGH (ref 4.0–10.5)
nRBC: 0 % (ref 0.0–0.2)

## 2023-07-04 LAB — RESP PANEL BY RT-PCR (RSV, FLU A&B, COVID)  RVPGX2
Influenza A by PCR: NEGATIVE
Influenza B by PCR: NEGATIVE
Resp Syncytial Virus by PCR: NEGATIVE
SARS Coronavirus 2 by RT PCR: NEGATIVE

## 2023-07-04 LAB — CBG MONITORING, ED: Glucose-Capillary: 159 mg/dL — ABNORMAL HIGH (ref 70–99)

## 2023-07-04 LAB — TROPONIN I (HIGH SENSITIVITY): Troponin I (High Sensitivity): 6 ng/L (ref <18)

## 2023-07-04 MED ORDER — CARVEDILOL 6.25 MG PO TABS
6.2500 mg | ORAL_TABLET | Freq: Two times a day (BID) | ORAL | Status: DC
  Administered 2023-07-05 – 2023-07-08 (×8): 6.25 mg via ORAL
  Filled 2023-07-04 (×8): qty 1

## 2023-07-04 MED ORDER — LEVOTHYROXINE SODIUM 50 MCG PO TABS
50.0000 ug | ORAL_TABLET | Freq: Every day | ORAL | Status: DC
Start: 2023-07-05 — End: 2023-07-08
  Administered 2023-07-05 – 2023-07-08 (×4): 50 ug via ORAL
  Filled 2023-07-04 (×4): qty 1

## 2023-07-04 MED ORDER — INSULIN ASPART 100 UNIT/ML IJ SOLN
0.0000 [IU] | Freq: Three times a day (TID) | INTRAMUSCULAR | Status: DC
  Administered 2023-07-04 – 2023-07-05 (×2): 3 [IU] via SUBCUTANEOUS
  Administered 2023-07-05: 5 [IU] via SUBCUTANEOUS
  Administered 2023-07-05 – 2023-07-06 (×2): 3 [IU] via SUBCUTANEOUS
  Administered 2023-07-06: 5 [IU] via SUBCUTANEOUS

## 2023-07-04 MED ORDER — LACTATED RINGERS IV SOLN: INTRAVENOUS | Status: AC

## 2023-07-04 MED ORDER — CLOPIDOGREL BISULFATE 75 MG PO TABS
75.0000 mg | ORAL_TABLET | Freq: Every day | ORAL | Status: DC
  Administered 2023-07-05 – 2023-07-08 (×4): 75 mg via ORAL
  Filled 2023-07-04 (×4): qty 1

## 2023-07-04 MED ORDER — ACETAMINOPHEN 650 MG RE SUPP: 650.0000 mg | Freq: Four times a day (QID) | RECTAL | Status: DC | PRN

## 2023-07-04 MED ORDER — CALCIUM CARBONATE 1250 (500 CA) MG PO TABS
600.0000 mg | ORAL_TABLET | Freq: Every day | ORAL | Status: DC
  Administered 2023-07-05 – 2023-07-08 (×4): 625 mg via ORAL
  Filled 2023-07-04 (×4): qty 1

## 2023-07-04 MED ORDER — HEPARIN SODIUM (PORCINE) 5000 UNIT/ML IJ SOLN
5000.0000 [IU] | Freq: Three times a day (TID) | INTRAMUSCULAR | Status: DC
  Administered 2023-07-04 – 2023-07-08 (×12): 5000 [IU] via SUBCUTANEOUS
  Filled 2023-07-04 (×12): qty 1

## 2023-07-04 MED ORDER — RANOLAZINE ER 500 MG PO TB12
500.0000 mg | ORAL_TABLET | Freq: Two times a day (BID) | ORAL | Status: DC
  Administered 2023-07-04 – 2023-07-08 (×8): 500 mg via ORAL
  Filled 2023-07-04 (×9): qty 1

## 2023-07-04 MED ORDER — ACETAMINOPHEN 325 MG PO TABS
650.0000 mg | ORAL_TABLET | Freq: Four times a day (QID) | ORAL | Status: DC | PRN
  Administered 2023-07-04 – 2023-07-06 (×3): 650 mg via ORAL
  Filled 2023-07-04 (×3): qty 2

## 2023-07-04 MED ORDER — FENTANYL CITRATE PF 50 MCG/ML IJ SOSY
25.0000 ug | PREFILLED_SYRINGE | Freq: Once | INTRAMUSCULAR | Status: AC
  Administered 2023-07-04: 25 ug via INTRAVENOUS
  Filled 2023-07-04: qty 1

## 2023-07-04 MED ORDER — ISOSORBIDE MONONITRATE ER 30 MG PO TB24
15.0000 mg | ORAL_TABLET | Freq: Every day | ORAL | Status: DC
  Administered 2023-07-05 – 2023-07-08 (×4): 15 mg via ORAL
  Filled 2023-07-04 (×4): qty 1

## 2023-07-04 MED ORDER — LACTATED RINGERS IV BOLUS
1000.0000 mL | Freq: Once | INTRAVENOUS | Status: AC
  Administered 2023-07-04: 1000 mL via INTRAVENOUS

## 2023-07-04 MED ORDER — SODIUM CHLORIDE 0.9% FLUSH
3.0000 mL | Freq: Two times a day (BID) | INTRAVENOUS | Status: DC
  Administered 2023-07-04 – 2023-07-08 (×8): 3 mL via INTRAVENOUS

## 2023-07-04 MED ORDER — EZETIMIBE 10 MG PO TABS
10.0000 mg | ORAL_TABLET | Freq: Every day | ORAL | Status: DC
  Administered 2023-07-05 – 2023-07-08 (×4): 10 mg via ORAL
  Filled 2023-07-04 (×4): qty 1

## 2023-07-04 MED ORDER — ONDANSETRON HCL 4 MG/2ML IJ SOLN
4.0000 mg | Freq: Once | INTRAMUSCULAR | Status: AC
Start: 2023-07-04 — End: 2023-07-04
  Administered 2023-07-04: 4 mg via INTRAVENOUS
  Filled 2023-07-04: qty 2

## 2023-07-04 MED ORDER — GABAPENTIN 300 MG PO CAPS
300.0000 mg | ORAL_CAPSULE | Freq: Two times a day (BID) | ORAL | Status: DC
  Administered 2023-07-04 – 2023-07-08 (×8): 300 mg via ORAL
  Filled 2023-07-04 (×8): qty 1

## 2023-07-04 MED ORDER — PANTOPRAZOLE SODIUM 40 MG PO TBEC
40.0000 mg | DELAYED_RELEASE_TABLET | Freq: Every day | ORAL | Status: DC
  Administered 2023-07-05 – 2023-07-08 (×4): 40 mg via ORAL
  Filled 2023-07-04 (×4): qty 1

## 2023-07-04 NOTE — ED Provider Notes (Signed)
 Pt initially seen by Dr Albertina.  Please see his note.  Pt  presented with syncopal episode, fall.  Patient reports feeling lightheaded.  Patient noted to have creatinine slightly increased compared to previous.  Patient was also noted to have an elevated white blood cell count.  No serious injuries noted from her fall.  With her age and comorbidities I will consult the hospitalist for admission for further evaluation of her syncopal episode.  With her elevated white blood cell count I will add on a urinalysis and RSV covid test  Case discussed with Dr Ricky regarding admission    Randol Simmonds, MD 07/04/23 [phone]

## 2023-07-04 NOTE — ED Notes (Signed)
 Patient transported to CT

## 2023-07-04 NOTE — Assessment & Plan Note (Addendum)
-  Suspect vasovagal due to her positive orthostatic signs and AKI.  Troponin and EKG reassuring at this time. -Will keep on continuous telemetry and keep for observation with repeat echocardiogram in the morning -Last echocardiogram on 12/2021 with a EF of 60 to 65%, grade 1 diastolic dysfunction.  No significant valvular abnormalities. -Keep on continuous IV LR fluid overnight

## 2023-07-04 NOTE — Assessment & Plan Note (Signed)
-  creatinine of 2.25 with baseline around 1.7 -[place]ckley prerenal from decreased intake.  Keep on continuous IV fluids overnight.

## 2023-07-04 NOTE — H&P (Signed)
 History and Physical    Patient: Carol [name] DOB: May 18, 1936 DOA: 07/04/2023 DOS: the patient was seen and examined on 07/04/2023 PCP: Carol Clapper, MD  Patient coming from: Home  Chief Complaint:  Chief Complaint  Patient presents with   Loss of Consciousness   HPI: Carol [name] is a 88 y.o. female with medical history significant of CAD s/p stent and CABG, HFrEF, HTN, T2DM, asthma, hypothyroidism, CKD4 who presents with syncope and fall.   Patient had been feeling unwell in the past few weeks.  She has been noting posterior neck and bilateral shoulder pain that feels similar to when she had her heart attack.  Had dry heaves last week with intermittent nausea this week.  Patient woke up from her nap today and felt dizzy.  She then bent down to feed her dog and had loss of consciousness.  Woke up on her kitchen floor and felt pain from her buttocks up to her neck.  Had 2 episodes of syncope on the same day in August while grocery shopping and then again when she returned home after bending down.  Did not seek medical care at that time. Has not had great oral intake this week.  In the ED, she was afebrile normotensive on room air.  Reportedly she had positive orthostatic vital signs and had unsteady gait with ambulation.  Her CBC was notable for leukocytosis of 17K.  CMP notable for AKI with creatinine of 2.25 with baseline around 1.7.  Troponin was reassuring at 6.  EKG in normal sinus rhythm without significant ST or T wave changes.  Her imaging studies including CT head, C-spine, L-spine, chest x-ray, pelvis x-ray were negative for any acute findings or fractures.  She was administered a liter of LR bolus in the ED. Attempt was made to ambulate patient in the ED however she again had symptoms of dizziness and unsteady gait.  Then hospitalist then consulted for workup of syncope.  Review of Systems: As mentioned in the history of present illness. All other systems  reviewed and are negative. Past Medical History:  Diagnosis Date   Anemia    a. Noted on labs 02/2014 possibly procedurally related.   Asthma    CAD S/P percutaneous coronary angioplasty 01/22/2014   A. (12/2013): POBA - OM2 99% (very tortuous segment) - reduced ~ 50% & TIMI 3 flow, 80-90% stenosis in mid PDA, Irregularities <50% in mRCA, pLAD and prox LCx; b. 02/17/14: Moderate sized fixed defect in Lateral wall --> cath with OM2 restenosis & otherwise stable --> Xience Alpine DES x 2 (2.25 mm x 12 & 8 mm). c. NSTEMI 02/2014 s/p PTCA/balloon angioplasty only to small caliber PDA.   Carotid stenosis    Carotid US  11/17: L 1-39; FU prn   Chronic systolic heart failure (HCC)    a. ICM b. ECHO (01/2014): EF 25-30%, grade I DD, trivial MR. c. EF by Myoview  02/17/14 = ~53%. d. EF by cath 02/24/14 - improved to 50-55%.   CKD (chronic kidney disease), stage III (HCC)    Contrast media allergy    Environmental allergies    GERD (gastroesophageal reflux disease)    Gout, unspecified    HOH (hard of hearing)    Hyperlipidemia    a. H/o intolerance to Zocor, Lipitor . Had muscle aches on higher dose Crestor .   Hypertension    Hypothyroidism    LBBB (left bundle branch block)    a. Transient during 02/2014 admission.   NSTEMI (non-ST elevated  myocardial infarction) (HCC) 2014; 12/2013, 01/2014   QT prolongation    a. Noted on EKG 02/2014.   Sinus bradycardia    a. HR 40s-50s during 02/2014 admission, limiting BB dose.   TIA (transient ischemic attack) 1990's   a. TIA vs stroke 1990's mild - sounds like a TIA as she was told she had stroke symptoms but negative scans. Denies residual effects.   Type II diabetes mellitus [facility])    Past Surgical History:  Procedure Laterality Date   ABDOMINAL HYSTERECTOMY     complete   AMPUTATION TOE Right 02/25/2021   hammer toe.   APPENDECTOMY  1959   CARDIAC CATHETERIZATION  02/24/2014   Procedure: CORONARY BALLOON ANGIOPLASTY;  Surgeon: Lonni JONETTA Cash, MD;   Location: [facility] CATH LAB;  Service: Cardiovascular;;   CARDIAC CATHETERIZATION N/A 04/20/2015   Procedure: Left Heart Cath and Coronary Angiography;  Surgeon: Victory LELON Sharps, MD;  Location: [facility] INVASIVE CV LAB;  Service: Cardiovascular;  Laterality: N/A;   CATARACT EXTRACTION W/ INTRAOCULAR LENS  IMPLANT, BILATERAL Bilateral 2013   CESAREAN SECTION  1959; 1960   CHOLECYSTECTOMY  1989   CORONARY ANGIOPLASTY  12/2013   POBA - OM2   CORONARY ARTERY BYPASS GRAFT N/A 04/25/2015   Procedure: CORONARY ARTERY BYPASS GRAFTING (CABG) x four,  using left internal mammary artery and right leg greater saphenous vein harvested endoscopically;  Surgeon: Dorise MARLA Fellers, MD;  Location: [facility] OR;  Service: Open Heart Surgery;  Laterality: N/A;   JOINT REPLACEMENT Right 10 years ago    right knee (TKR)   LEFT HEART CATHETERIZATION WITH CORONARY ANGIOGRAM N/A 01/22/2014   Procedure: LEFT HEART CATHETERIZATION WITH CORONARY ANGIOGRAM;  Surgeon: Victory LELON Sharps DOUGLAS, MD;  Location: [facility] CATH LAB;  Service: Cardiovascular;  Laterality: N/A;   LEFT HEART CATHETERIZATION WITH CORONARY ANGIOGRAM N/A 02/17/2014   Procedure: LEFT HEART CATHETERIZATION WITH CORONARY ANGIOGRAM;  Surgeon: Lonni JONETTA Cash, MD;  Location: [facility] CATH LAB;  Service: Cardiovascular;  Laterality: N/A;   LEFT HEART CATHETERIZATION WITH CORONARY ANGIOGRAM N/A 02/24/2014   Procedure: LEFT HEART CATHETERIZATION WITH CORONARY ANGIOGRAM;  Surgeon: Lonni JONETTA Cash, MD;  Location: [facility] CATH LAB;  Service: Cardiovascular;  Laterality: N/A;   LUMBAR LAMINECTOMY/DECOMPRESSION MICRODISCECTOMY Left 12/22/2014   Procedure: CENTRAL DECOMPRESSIVE LUMBAR, AND LAMINECTOMY L5-S1,  S1-S2 FOR SPINAL STENOSIS FORAMINOTOMY L5, S1, S2 ROOTS ON LEFT FOR FORAMINAL STENOSIS, AND DECOMPRESSION L5-S1, S1-S2 ACCORDING TO LABELING OF [facility] X-RAYS;  Surgeon: Tanda Heading, MD;  Location: [facility] ORS;  Service: Orthopedics;  Laterality: Left;   PERCUTANEOUS CORONARY STENT  INTERVENTION (PCI-S)  02/17/2014   For restenosis of OM2 - PCI with Xience Apline DES 2.25 mm x 12 mm & 2.25 mm x 8 mm overlapping   REDUCTION MAMMAPLASTY Bilateral 1990's   SHOULDER OPEN ROTATOR CUFF REPAIR Bilateral 1980's - 1990's   TEE WITHOUT CARDIOVERSION N/A 04/25/2015   Procedure: TRANSESOPHAGEAL ECHOCARDIOGRAM (TEE);  Surgeon: Dorise MARLA Fellers, MD;  Location: [facility] OR;  Service: Open Heart Surgery;  Laterality: N/A;   TOTAL KNEE ARTHROPLASTY Right 2009   TOTAL KNEE ARTHROPLASTY Left 04/21/2018   TKR;  Surgeon: Rubie Kemps, MD;  Location: [facility] ORS;  Service: Orthopedics;  Laterality: Left;  Adductor Block   Social History:  reports that she has never smoked. She has never used smokeless tobacco. She reports that she does not drink alcohol and does not use drugs.  Allergies  Allergen Reactions   Iodinated Contrast Media Anaphylaxis and Other (See Comments)  unknown Contrast agent Makes me shake all over   Atorvastatin  Other (See Comments)    Muscle aches- severe   Corticosteroids Other (See Comments)    Elevated blood sugar     Hydrocodone  Nausea And Vomiting   Other Nausea And Vomiting    Pain medications except can tramadol    Oxycodone  Nausea And Vomiting   Simvastatin Other (See Comments)    Muscle aches- severe   Cortizone-10 [Hydrocortisone] Other (See Comments)    unknown    Family History  Problem Relation Age of Onset   Hypertension Mother    Diabetes Mother    Heart attack Mother 8   Heart disease Father    Emphysema Father    Diabetes Brother    Heart disease Brother    Cancer Sister    Stroke Neg Hx     Prior to Admission medications   Medication Sig Start Date End Date Taking? Authorizing Provider  albuterol  (VENTOLIN  HFA) 108 (90 Base) MCG/ACT inhaler INHALE 2 PUFFS INTO THE LUNGS EVERY 6 (SIX) HOURS AS NEEDED FOR WHEEZING OR SHORTNESS OF BREATH. 12/17/22   CoxAbigail, MD  Alcohol Swabs (ALCOHOL WIPES) 70 % PADS USE ONE PAD TO CLEAN AREA TO CHECK  BLOOD SUGAR AS  DIRECTED 11/24/20   [provider]  allopurinol  (ZYLOPRIM ) 300 MG tablet TAKE ONE TABLET BY MOUTH ONCE DAILY IN THE MORNING 07/04/23   Cox, Abigail, MD  Biotin  5000 MCG CAPS Take 5,000 mcg by mouth every morning.    [provider]  Blood Glucose Monitoring Suppl (TRUE METRIX METER) DEVI Use to test blood sugars once daily. ICD: E11.65 11/20/19   [provider]  calcium  carbonate (OSCAL) 1500 (600 Ca) MG TABS tablet Take 600 mg by mouth 2 (two) times daily with a meal.    [provider]  carvedilol  (COREG ) 6.25 MG tablet TAKE 1 TABLET TWICE DAILY WITH MEALS 06/05/23   Cox, Kirsten, MD  clopidogrel  (PLAVIX ) 75 MG tablet TAKE 1 TABLET EVERY DAY 05/13/23   Sirivol, Mamatha, MD  colchicine  0.6 MG tablet Take 1 tablet (0.6 mg total) by mouth 2 (two) times daily as needed (gout flare up.). 01/29/23 03/29/23  Sirivol, Mamatha, MD  Continuous Blood Gluc Receiver (FREESTYLE LIBRE READER) DEVI 1 each by Does not apply route as directed. 09/29/21   CoxAbigail, MD  empagliflozin  (JARDIANCE ) 25 MG TABS tablet Take 1 tablet (25 mg total) by mouth daily before breakfast. Patient not taking: Reported on 06/21/2023 02/22/23   CoxAbigail, MD  Evolocumab  (REPATHA  SURECLICK) 140 MG/ML SOAJ Inject 140 mg into the skin every 14 (fourteen) days. 09/17/22   Hobart Powell BRAVO, MD  ezetimibe  (ZETIA ) 10 MG tablet TAKE 1 TABLET EVERY DAY 05/13/23   Sirivol, Mamatha, MD  famotidine  (PEPCID ) 10 MG tablet Take 10 mg by mouth 2 (two) times daily as needed for heartburn or indigestion.     [provider]  fluticasone (FLONASE) 50 MCG/ACT nasal spray USE 1 SPRAY NASALLY EVERY DAY 07/26/20   [provider]  furosemide  (LASIX ) 80 MG tablet TAKE 1 TABLET EVERY DAY Patient taking differently: Take 80 mg by mouth as needed. 10/03/22   CoxAbigail, MD  gabapentin  (NEURONTIN ) 300 MG capsule Take 1 capsule (300 mg total) by mouth 3 (three) times daily as needed. 06/21/23    CoxAbigail, MD  glucose blood (TRUE METRIX BLOOD GLUCOSE TEST) test strip Use as instructed 10/26/21   CoxAbigail, MD  insulin  NPH-regular Human (NOVOLIN 70/30) (70-30)  100 UNIT/ML injection Inject 25-32 Units into the skin daily. Reports 20 units in morning and 10-12 at night if needed. 05/27/15   [provider]  isosorbide  mononitrate (IMDUR ) 30 MG 24 hr tablet Take 0.5 tablets (15 mg total) by mouth daily. 03/29/23   Chandrasekhar, Mahesh A, MD  levothyroxine  (SYNTHROID , LEVOTHROID) 50 MCG tablet Take 50 mcg by mouth every morning.  05/31/14   [provider]  methocarbamol  (ROBAXIN ) 500 MG tablet Take 1-2 tablets (500-1,000 mg total) by mouth every 6 (six) hours as needed for muscle spasms. 04/22/18   Silver Laurell Redbird, PA  nitroGLYCERIN  (NITROSTAT ) 0.4 MG SL tablet DISSOLVE 1 TABLET UNDER THE TONGUE EVERY 5 MINUTES AS NEEDED FOR CHEST PAIN UP TO 3 TIMES. CALL EMS IF YOU NEED A SECOND. 10/03/22   Cox, Abigail, MD  pantoprazole  (PROTONIX ) 40 MG tablet TAKE 1 TABLET EVERY DAY 10/03/22   Cox, Kirsten, MD  potassium chloride  (KLOR-CON ) 10 MEQ tablet Take 10 mEq by mouth as needed. To take one (1) tablet if you take extra lasix  for weight gain. 07/20/21   [provider]  ranolazine  (RANEXA ) 500 MG 12 hr tablet TAKE 1 TABLET TWICE DAILY 05/02/23   Cox, Kirsten, MD  sulfamethoxazole -trimethoprim  (BACTRIM  DS) 800-160 MG tablet Take 1 tablet by mouth 2 (two) times daily. 06/21/23   CoxAbigail, MD  TRADJENTA  5 MG TABS tablet TAKE 1 TABLET EVERY DAY 10/03/22   Cox, Abigail, MD  TRUEplus Lancets 33G MISC Use to test blood sugar once daily. ICD: E11.65 11/20/19   [provider]  valACYclovir  (VALTREX ) 500 MG tablet Take 1 tablet (500 mg total) by mouth 3 (three) times daily. 12/11/22   Carol Abigail, MD    Physical Exam: Vitals:   07/04/23 1405 07/04/23 1437 07/04/23 2005  BP: 118/61  (!) 170/75  Pulse: 67  73  Resp: 18  15  Temp: 97.8 F (36.6 C)    TempSrc: Oral     SpO2: 98%  90%  Weight:  85.7 kg   Height:  5' 7.5 (1.715 m)    Constitutional: NAD, calm, comfortable, mildly ill-appearing female lying in bed Eyes:  lids and conjunctivae normal ENMT: Mucous membranes are moist.  Neck: normal, supple Respiratory: clear to auscultation bilaterally, no wheezing, no crackles. Normal respiratory effort. No accessory muscle use.  Cardiovascular: Regular rate and rhythm, no murmurs / rubs / gallops. No extremity edema.  Abdomen: soft, no tenderness, no masses palpated. No hepatosplenomegaly. Bowel sounds positive.  Musculoskeletal: no clubbing / cyanosis. No joint deformity upper and lower extremities. Normal muscle tone.  Skin: no rashes, lesions, ulcers. No induration Neurologic: CN 2-12 grossly intact. Sensation intact, Strength 5/5 in all 4.  Psychiatric: Normal judgment and insight. Alert and oriented x 3. Normal mood. Data Reviewed:  See HPI  Assessment and Plan: * Syncope -Suspect vasovagal due to her positive orthostatic signs and AKI.  Troponin and EKG reassuring at this time. -Will keep on continuous telemetry and keep for observation with repeat echocardiogram in the morning -Last echocardiogram on 12/2021 with a EF of 60 to 65%, grade 1 diastolic dysfunction.  No significant valvular abnormalities. -Keep on continuous IV LR fluid overnight  Type 2 diabetes mellitus with chronic kidney disease, with long-term current use of insulin  (HCC) -Last A1c a few weeks ago of 7.9 -Moderate SSI  AKI (acute kidney injury) (HCC) -creatinine of 2.25 with baseline around 1.7 -[place] prerenal from decreased intake.  Keep on continuous IV fluids overnight.  Hx  of CABG - Patient complaining of posterior neck pain which feels similar to her prior MI.  However, her pain was improved once her neck was adjusted with additional pillows suggesting more MSK in origin.  Her troponin and EKG also reassuring at this time. -Low suspicion of cardiogenic cause of her  syncope.  Will keep on continuous telemetry and obtain repeat echocardiogram in the morning. -continue Plavix , Imdur , beta blocker  Acquired hypothyroidism - Continue levothyroxine       Advance Care Planning:   Code Status: Limited: Do not attempt resuscitation (DNR) -DNR-LIMITED -Do Not Intubate/DNI    Consults: None  Family Communication: None at bedside Severity of Illness: The appropriate patient status for this patient is OBSERVATION. Observation status is judged to be reasonable and necessary in order to provide the required intensity of service to ensure the patient's safety. The patient's presenting symptoms, physical exam findings, and initial radiographic and laboratory data in the context of their medical condition is felt to place them at decreased risk for further clinical deterioration. Furthermore, it is anticipated that the patient will be medically stable for discharge from the hospital within 2 midnights of admission.   Author: Alfrieda ONEIDA Pillar, DO 07/04/2023 9:11 PM  For on call review [email or web address].

## 2023-07-04 NOTE — Assessment & Plan Note (Signed)
 Continue levothyroxine

## 2023-07-04 NOTE — Assessment & Plan Note (Addendum)
-  Last A1c a few weeks ago of 7.9 -Moderate SSI

## 2023-07-04 NOTE — Assessment & Plan Note (Addendum)
-   Patient complaining of posterior neck pain which feels similar to her prior MI.  However, her pain was improved once her neck was adjusted with additional pillows suggesting more MSK in origin.  Her troponin and EKG also reassuring at this time. -Low suspicion of cardiogenic cause of her syncope.  Will keep on continuous telemetry and obtain repeat echocardiogram in the morning. -continue Plavix , Imdur , beta blocker

## 2023-07-04 NOTE — ED Notes (Signed)
 Central telemetry notified for cardiac monitoring.

## 2023-07-04 NOTE — ED Triage Notes (Addendum)
 PT BIB [facility] from home after having a syncopal episode.PT was cleaning her dog bowl when she became dizzy, LOC and fell hitting her head causing a hematoma in the back of her head.PT had orthostatic vital signs and is c/o light headness and has a unsafe gait when ambulating.PT is a type 2 diabetic, last CBG was 143.PT is not on thinners per EMS.

## 2023-07-04 NOTE — ED Notes (Signed)
 Trauma Response Nurse Documentation   Carol [name] is a 88 y.o. female arriving to [place] ED via EMS  On clopidogrel  75 mg daily. Trauma was activated as a Level 2 by ED Charge RN based on the following trauma criteria Elderly patients > 65 with head trauma on anti-coagulation (excluding ASA).  Patient cleared for CT by Dr. Albertina. Pt transported to CT with trauma response nurse present to monitor. RN remained with the patient throughout their absence from the department for clinical observation.   GCS 15.   History   Past Medical History:  Diagnosis Date   Anemia    a. Noted on labs 02/2014 possibly procedurally related.   Asthma    CAD S/P percutaneous coronary angioplasty 01/22/2014   A. (12/2013): POBA - OM2 99% (very tortuous segment) - reduced ~ 50% & TIMI 3 flow, 80-90% stenosis in mid PDA, Irregularities <50% in mRCA, pLAD and prox LCx; b. 02/17/14: Moderate sized fixed defect in Lateral wall --> cath with OM2 restenosis & otherwise stable --> Xience Alpine DES x 2 (2.25 mm x 12 & 8 mm). c. NSTEMI 02/2014 s/p PTCA/balloon angioplasty only to small caliber PDA.   Carotid stenosis    Carotid US  11/17: L 1-39; FU prn   Chronic systolic heart failure (HCC)    a. ICM b. ECHO (01/2014): EF 25-30%, grade I DD, trivial MR. c. EF by Myoview  02/17/14 = ~53%. d. EF by cath 02/24/14 - improved to 50-55%.   CKD (chronic kidney disease), stage III (HCC)    Contrast media allergy    Environmental allergies    GERD (gastroesophageal reflux disease)    Gout, unspecified    HOH (hard of hearing)    Hyperlipidemia    a. H/o intolerance to Zocor, Lipitor . Had muscle aches on higher dose Crestor .   Hypertension    Hypothyroidism    LBBB (left bundle branch block)    a. Transient during 02/2014 admission.   NSTEMI (non-ST elevated myocardial infarction) (HCC) 2014; 12/2013, 01/2014   QT prolongation    a. Noted on EKG 02/2014.   Sinus bradycardia    a. HR 40s-50s during 02/2014 admission, limiting  BB dose.   TIA (transient ischemic attack) 1990's   a. TIA vs stroke 1990's mild - sounds like a TIA as she was told she had stroke symptoms but negative scans. Denies residual effects.   Type II diabetes mellitus [facility])      Past Surgical History:  Procedure Laterality Date   ABDOMINAL HYSTERECTOMY     complete   AMPUTATION TOE Right 02/25/2021   hammer toe.   APPENDECTOMY  1959   CARDIAC CATHETERIZATION  02/24/2014   Procedure: CORONARY BALLOON ANGIOPLASTY;  Surgeon: Lonni JONETTA Cash, MD;  Location: [facility] CATH LAB;  Service: Cardiovascular;;   CARDIAC CATHETERIZATION N/A 04/20/2015   Procedure: Left Heart Cath and Coronary Angiography;  Surgeon: Victory LELON Sharps, MD;  Location: [facility] INVASIVE CV LAB;  Service: Cardiovascular;  Laterality: N/A;   CATARACT EXTRACTION W/ INTRAOCULAR LENS  IMPLANT, BILATERAL Bilateral 2013   CESAREAN SECTION  1959; 1960   CHOLECYSTECTOMY  1989   CORONARY ANGIOPLASTY  12/2013   POBA - OM2   CORONARY ARTERY BYPASS GRAFT N/A 04/25/2015   Procedure: CORONARY ARTERY BYPASS GRAFTING (CABG) x four,  using left internal mammary artery and right leg greater saphenous vein harvested endoscopically;  Surgeon: Dorise MARLA Fellers, MD;  Location: [facility] OR;  Service: Open Heart Surgery;  Laterality: N/A;   JOINT  REPLACEMENT Right 10 years ago    right knee (TKR)   LEFT HEART CATHETERIZATION WITH CORONARY ANGIOGRAM N/A 01/22/2014   Procedure: LEFT HEART CATHETERIZATION WITH CORONARY ANGIOGRAM;  Surgeon: Victory LELON Claudene DOUGLAS, MD;  Location: [facility] CATH LAB;  Service: Cardiovascular;  Laterality: N/A;   LEFT HEART CATHETERIZATION WITH CORONARY ANGIOGRAM N/A 02/17/2014   Procedure: LEFT HEART CATHETERIZATION WITH CORONARY ANGIOGRAM;  Surgeon: Lonni JONETTA Cash, MD;  Location: [facility] CATH LAB;  Service: Cardiovascular;  Laterality: N/A;   LEFT HEART CATHETERIZATION WITH CORONARY ANGIOGRAM N/A 02/24/2014   Procedure: LEFT HEART CATHETERIZATION WITH CORONARY ANGIOGRAM;  Surgeon: Lonni JONETTA Cash, MD;  Location: [facility] CATH LAB;  Service: Cardiovascular;  Laterality: N/A;   LUMBAR LAMINECTOMY/DECOMPRESSION MICRODISCECTOMY Left 12/22/2014   Procedure: CENTRAL DECOMPRESSIVE LUMBAR, AND LAMINECTOMY L5-S1,  S1-S2 FOR SPINAL STENOSIS FORAMINOTOMY L5, S1, S2 ROOTS ON LEFT FOR FORAMINAL STENOSIS, AND DECOMPRESSION L5-S1, S1-S2 ACCORDING TO LABELING OF [facility] X-RAYS;  Surgeon: Tanda Heading, MD;  Location: [facility] ORS;  Service: Orthopedics;  Laterality: Left;   PERCUTANEOUS CORONARY STENT INTERVENTION (PCI-S)  02/17/2014   For restenosis of OM2 - PCI with Xience Apline DES 2.25 mm x 12 mm & 2.25 mm x 8 mm overlapping   REDUCTION MAMMAPLASTY Bilateral 1990's   SHOULDER OPEN ROTATOR CUFF REPAIR Bilateral 1980's - 1990's   TEE WITHOUT CARDIOVERSION N/A 04/25/2015   Procedure: TRANSESOPHAGEAL ECHOCARDIOGRAM (TEE);  Surgeon: Dorise MARLA Fellers, MD;  Location: [facility] OR;  Service: Open Heart Surgery;  Laterality: N/A;   TOTAL KNEE ARTHROPLASTY Right 2009   TOTAL KNEE ARTHROPLASTY Left 04/21/2018   TKR;  Surgeon: Rubie Kemps, MD;  Location: [facility] ORS;  Service: Orthopedics;  Laterality: Left;  Adductor Block     Initial Focused Assessment (If applicable, or please see trauma documentation): Airway: intact, patent Breathing: Breath sounds clear, equal bilaterally Circulation: Pulses intact centrally and peripherally, Hematoma to back of head, 18G PIV to L AC Disability: A/O x4, PERRLA, MAE VSS  CT's Completed:   CT head, CT c-spine and CT chest, abd pelvis w/out contrast.   Interventions:  - labs drawn - CXR - Pelvic XR - CT pan scan w/ out contrast  - 25mcg fentanyl  given - 1L LR bolus given  - 4mg  zofran  given  Plan for disposition:  Admission to floor  Pt's Cr slightly elevated, has elevated wbcs and has been having syncopal episodes.  Rec medical admit - pt wanting to leave AMA.   Consults completed:  none at 1900.  Event Summary: Pt BIB [facility] after having a syncopal  episode in her kitchen.  Pt was cleaning her dog bowl when she became dizzy had LOC and fell, striking her head.  Pt was orthostatic with her vitals.  Pt on plavix  due to previous open heart surgery.    Bedside handoff with ED RN Lorenza.    LEBRON ROCKIE LELON  Trauma Response RN  Please call TRN at [phone] for further assistance.

## 2023-07-04 NOTE — ED Notes (Signed)
 PT cleared to leave AMA if able to ambulate per Lynelle Doctor MD and admitting physician

## 2023-07-05 ENCOUNTER — Observation Stay (HOSPITAL_BASED_OUTPATIENT_CLINIC_OR_DEPARTMENT_OTHER): Payer: Medicare HMO

## 2023-07-05 ENCOUNTER — Observation Stay (HOSPITAL_COMMUNITY): Payer: Medicare HMO

## 2023-07-05 ENCOUNTER — Ambulatory Visit: Payer: Medicare HMO

## 2023-07-05 DIAGNOSIS — Z951 Presence of aortocoronary bypass graft: Secondary | ICD-10-CM | POA: Diagnosis not present

## 2023-07-05 DIAGNOSIS — Z794 Long term (current) use of insulin: Secondary | ICD-10-CM | POA: Diagnosis not present

## 2023-07-05 DIAGNOSIS — E039 Hypothyroidism, unspecified: Secondary | ICD-10-CM | POA: Diagnosis present

## 2023-07-05 DIAGNOSIS — N261 Atrophy of kidney (terminal): Secondary | ICD-10-CM | POA: Diagnosis not present

## 2023-07-05 DIAGNOSIS — Z79899 Other long term (current) drug therapy: Secondary | ICD-10-CM | POA: Diagnosis not present

## 2023-07-05 DIAGNOSIS — E860 Dehydration: Secondary | ICD-10-CM | POA: Diagnosis present

## 2023-07-05 DIAGNOSIS — E785 Hyperlipidemia, unspecified: Secondary | ICD-10-CM | POA: Diagnosis present

## 2023-07-05 DIAGNOSIS — I440 Atrioventricular block, first degree: Secondary | ICD-10-CM | POA: Diagnosis present

## 2023-07-05 DIAGNOSIS — N189 Chronic kidney disease, unspecified: Secondary | ICD-10-CM | POA: Diagnosis not present

## 2023-07-05 DIAGNOSIS — I639 Cerebral infarction, unspecified: Secondary | ICD-10-CM

## 2023-07-05 DIAGNOSIS — N179 Acute kidney failure, unspecified: Secondary | ICD-10-CM | POA: Diagnosis present

## 2023-07-05 DIAGNOSIS — I130 Hypertensive heart and chronic kidney disease with heart failure and stage 1 through stage 4 chronic kidney disease, or unspecified chronic kidney disease: Secondary | ICD-10-CM | POA: Diagnosis present

## 2023-07-05 DIAGNOSIS — Z66 Do not resuscitate: Secondary | ICD-10-CM | POA: Diagnosis present

## 2023-07-05 DIAGNOSIS — D631 Anemia in chronic kidney disease: Secondary | ICD-10-CM | POA: Diagnosis present

## 2023-07-05 DIAGNOSIS — M545 Low back pain, unspecified: Secondary | ICD-10-CM | POA: Diagnosis present

## 2023-07-05 DIAGNOSIS — N184 Chronic kidney disease, stage 4 (severe): Secondary | ICD-10-CM | POA: Diagnosis present

## 2023-07-05 DIAGNOSIS — E1122 Type 2 diabetes mellitus with diabetic chronic kidney disease: Secondary | ICD-10-CM | POA: Diagnosis present

## 2023-07-05 DIAGNOSIS — W19XXXA Unspecified fall, initial encounter: Secondary | ICD-10-CM | POA: Diagnosis present

## 2023-07-05 DIAGNOSIS — H9190 Unspecified hearing loss, unspecified ear: Secondary | ICD-10-CM | POA: Diagnosis present

## 2023-07-05 DIAGNOSIS — I951 Orthostatic hypotension: Secondary | ICD-10-CM

## 2023-07-05 DIAGNOSIS — R55 Syncope and collapse: Secondary | ICD-10-CM | POA: Diagnosis present

## 2023-07-05 DIAGNOSIS — Z1152 Encounter for screening for COVID-19: Secondary | ICD-10-CM | POA: Diagnosis not present

## 2023-07-05 DIAGNOSIS — E1143 Type 2 diabetes mellitus with diabetic autonomic (poly)neuropathy: Secondary | ICD-10-CM | POA: Diagnosis present

## 2023-07-05 DIAGNOSIS — Z96653 Presence of artificial knee joint, bilateral: Secondary | ICD-10-CM | POA: Diagnosis present

## 2023-07-05 DIAGNOSIS — E1165 Type 2 diabetes mellitus with hyperglycemia: Secondary | ICD-10-CM | POA: Diagnosis present

## 2023-07-05 DIAGNOSIS — J45909 Unspecified asthma, uncomplicated: Secondary | ICD-10-CM | POA: Diagnosis present

## 2023-07-05 DIAGNOSIS — I5022 Chronic systolic (congestive) heart failure: Secondary | ICD-10-CM | POA: Diagnosis present

## 2023-07-05 DIAGNOSIS — I2510 Atherosclerotic heart disease of native coronary artery without angina pectoris: Secondary | ICD-10-CM | POA: Diagnosis present

## 2023-07-05 DIAGNOSIS — Z79890 Hormone replacement therapy (postmenopausal): Secondary | ICD-10-CM | POA: Diagnosis not present

## 2023-07-05 LAB — GLUCOSE, CAPILLARY
Glucose-Capillary: 148 mg/dL — ABNORMAL HIGH (ref 70–99)
Glucose-Capillary: 153 mg/dL — ABNORMAL HIGH (ref 70–99)
Glucose-Capillary: 153 mg/dL — ABNORMAL HIGH (ref 70–99)
Glucose-Capillary: 173 mg/dL — ABNORMAL HIGH (ref 70–99)
Glucose-Capillary: 236 mg/dL — ABNORMAL HIGH (ref 70–99)

## 2023-07-05 LAB — ECHOCARDIOGRAM COMPLETE
Area-P 1/2: 2.27 cm2
Height: 67.5 in
S' Lateral: 2.8 cm
Weight: 3001.78 [oz_av]

## 2023-07-05 LAB — CBC
HCT: 37.4 % (ref 36.0–46.0)
Hemoglobin: 12.4 g/dL (ref 12.0–15.0)
MCH: 31.9 pg (ref 26.0–34.0)
MCHC: 33.2 g/dL (ref 30.0–36.0)
MCV: 96.1 fL (ref 80.0–100.0)
Platelets: 220 10*3/uL (ref 150–400)
RBC: 3.89 MIL/uL (ref 3.87–5.11)
RDW: 15 % (ref 11.5–15.5)
WBC: 11.3 10*3/uL — ABNORMAL HIGH (ref 4.0–10.5)
nRBC: 0 % (ref 0.0–0.2)

## 2023-07-05 LAB — BASIC METABOLIC PANEL
Anion gap: 15 (ref 5–15)
BUN: 51 mg/dL — ABNORMAL HIGH (ref 8–23)
CO2: 26 mmol/L (ref 22–32)
Calcium: 9.4 mg/dL (ref 8.9–10.3)
Chloride: 96 mmol/L — ABNORMAL LOW (ref 98–111)
Creatinine, Ser: 2.21 mg/dL — ABNORMAL HIGH (ref 0.44–1.00)
GFR, Estimated: 21 mL/min — ABNORMAL LOW (ref >60)
Glucose, Bld: 230 mg/dL — ABNORMAL HIGH (ref 70–99)
Potassium: 4.6 mmol/L (ref 3.5–5.1)
Sodium: 137 mmol/L (ref 135–145)

## 2023-07-05 MED ORDER — LACTATED RINGERS IV SOLN: INTRAVENOUS | Status: AC

## 2023-07-05 MED ORDER — PERFLUTREN LIPID MICROSPHERE
1.0000 mL | INTRAVENOUS | Status: AC | PRN
  Administered 2023-07-05: 4 mL via INTRAVENOUS

## 2023-07-05 NOTE — ED Provider Notes (Signed)
 [facility] Provider Note  CSN: [id number] Arrival date & time: 07/04/23 1405  Chief Complaint(s) Loss of Consciousness  HPI Carol [name] is a 88 y.o. female with PMH CHF, CAD status post CABG, T2DM, asthma, CKD 4 who presents as a level 2 trauma for fall on blood thinners and syncope.  Patient takes Plavix  and was bending down to feed her dog when she suffered a syncopal episode striking her head and back on the ground.  She arrives with complaints of posterior headache and upper and lower back pain.  Denies chest pain, shortness of breath, nausea, vomiting, numbness, tingling, weakness or other neurologic complaints.   Past Medical History Past Medical History:  Diagnosis Date   Anemia    a. Noted on labs 02/2014 possibly procedurally related.   Asthma    CAD S/P percutaneous coronary angioplasty 01/22/2014   A. (12/2013): POBA - OM2 99% (very tortuous segment) - reduced ~ 50% & TIMI 3 flow, 80-90% stenosis in mid PDA, Irregularities <50% in mRCA, pLAD and prox LCx; b. 02/17/14: Moderate sized fixed defect in Lateral wall --> cath with OM2 restenosis & otherwise stable --> Xience Alpine DES x 2 (2.25 mm x 12 & 8 mm). c. NSTEMI 02/2014 s/p PTCA/balloon angioplasty only to small caliber PDA.   Carotid stenosis    Carotid US  11/17: L 1-39; FU prn   Chronic systolic heart failure (HCC)    a. ICM b. ECHO (01/2014): EF 25-30%, grade I DD, trivial MR. c. EF by Myoview  02/17/14 = ~53%. d. EF by cath 02/24/14 - improved to 50-55%.   CKD (chronic kidney disease), stage III (HCC)    Contrast media allergy    Environmental allergies    GERD (gastroesophageal reflux disease)    Gout, unspecified    HOH (hard of hearing)    Hyperlipidemia    a. H/o intolerance to Zocor, Lipitor . Had muscle aches on higher dose Crestor .   Hypertension    Hypothyroidism    LBBB (left bundle branch block)    a. Transient during 02/2014 admission.   NSTEMI (non-ST elevated myocardial infarction)  (HCC) 2014; 12/2013, 01/2014   QT prolongation    a. Noted on EKG 02/2014.   Sinus bradycardia    a. HR 40s-50s during 02/2014 admission, limiting BB dose.   TIA (transient ischemic attack) 1990's   a. TIA vs stroke 1990's mild - sounds like a TIA as she was told she had stroke symptoms but negative scans. Denies residual effects.   Type II diabetes mellitus [facility])    Patient Active Problem List   Diagnosis Date Noted   AKI (acute kidney injury) (HCC) 07/04/2023   Type 2 diabetes mellitus with chronic kidney disease, with long-term current use of insulin  (HCC) 07/04/2023   Malaise 06/22/2023   Encounter for Medicare annual wellness exam 05/06/2023   Acute cystitis without hematuria 02/20/2023   Herpes zoster without complication 12/11/2022   Essential hypertension 10/27/2022   Syncope 08/09/2022   Anemia 08/02/2022   Asthma 08/02/2022   Carotid stenosis 08/02/2022   Contrast media allergy 08/02/2022   Environmental allergies 08/02/2022   HOH (hard of hearing) 08/02/2022   LBBB (left bundle branch block) 08/02/2022   NSTEMI (non-ST elevated myocardial infarction) (HCC) 08/02/2022   QT prolongation 08/02/2022   TIA (transient ischemic attack) 08/02/2022   Sinus bradycardia 07/18/2022   Class 1 obesity due to excess calories with serious comorbidity and body mass index (BMI) of 30.0 to 30.9 in adult  01/14/2022   Dizziness 11/10/2021   Osteoporosis screening 10/03/2021   Myalgia due to statin 10/03/2021   Chronic idiopathic constipation 09/28/2021   S/P TKR (total knee replacement) using cement, left 04/21/2018   Hx of CABG 04/25/2015   GERD (gastroesophageal reflux disease) 04/19/2015   Hypertensive heart and renal disease 04/19/2015   Spinal stenosis, lumbar region, with neurogenic claudication 12/22/2014   Controlled type 2 diabetes mellitus with diabetic neuropathy, with long-term current use of insulin  (HCC) 04/10/2014   Asthma, moderate persistent 03/23/2014   Post-infarction  angina (HCC) 02/17/2014   Abnormal nuclear stress test: Intermediate risk 02/17/2014   Presence of drug coated stent in left circumflex coronary artery: OM2 - Xience Alpine DES 2.25 mm x 12, 2.25 mm x 8 mm overlap 02/17/2014    Class: Acute   Ischemic cardiomyopathy - with near resolution of LVEF post MI 02/15/2014   Heart failure with improved ejection fraction (HFimpEF) (HCC) 02/15/2014   Chronic kidney disease, stage 4 (severe) (HCC) 02/15/2014   Left-sided chest pain 01/22/2014   Hyperlipidemia 01/22/2014   History of non-ST elevation myocardial infarction (NSTEMI) 01/22/2014   CAD in native artery 01/22/2014   Mixed conductive and sensorineural hearing loss 12/05/2012   Acquired hypothyroidism 12/05/2012   Lower back pain 12/05/2012   Gout, unspecified    Home Medication(s) Prior to Admission medications   Medication Sig Start Date End Date Taking? Authorizing Provider  albuterol  (VENTOLIN  HFA) 108 (90 Base) MCG/ACT inhaler INHALE 2 PUFFS INTO THE LUNGS EVERY 6 (SIX) HOURS AS NEEDED FOR WHEEZING OR SHORTNESS OF BREATH. 12/17/22  Yes Cox, Kirsten, MD  allopurinol  (ZYLOPRIM ) 300 MG tablet TAKE ONE TABLET BY MOUTH ONCE DAILY IN THE MORNING 07/04/23  Yes Cox, Kirsten, MD  Biotin  5000 MCG CAPS Take 5,000 mcg by mouth every morning.   Yes [provider]  calcium  carbonate (OSCAL) 1500 (600 Ca) MG TABS tablet Take 600 mg by mouth daily with breakfast.   Yes [provider]  carvedilol  (COREG ) 6.25 MG tablet TAKE 1 TABLET TWICE DAILY WITH MEALS 06/05/23  Yes Cox, Kirsten, MD  clopidogrel  (PLAVIX ) 75 MG tablet TAKE 1 TABLET EVERY DAY 05/13/23  Yes Sirivol, Mamatha, MD  empagliflozin  (JARDIANCE ) 25 MG TABS tablet Take 1 tablet (25 mg total) by mouth daily before breakfast. 02/22/23  Yes Cox, Kirsten, MD  ezetimibe  (ZETIA ) 10 MG tablet TAKE 1 TABLET EVERY DAY 05/13/23  Yes Sirivol, Mamatha, MD  famotidine  (PEPCID ) 10 MG tablet Take 10 mg by mouth 2 (two) times daily as needed for  heartburn or indigestion.    Yes [provider]  fluticasone (FLONASE) 50 MCG/ACT nasal spray Place 1 spray into both nostrils daily as needed for allergies. 07/26/20  Yes [provider]  furosemide  (LASIX ) 80 MG tablet TAKE 1 TABLET EVERY DAY Patient taking differently: Take 80 mg by mouth as needed for fluid. 10/03/22  Yes Cox, Kirsten, MD  gabapentin  (NEURONTIN ) 300 MG capsule Take 1 capsule (300 mg total) by mouth 3 (three) times daily as needed. Patient taking differently: Take 300 mg by mouth 2 (two) times daily. 06/21/23  Yes Cox, Kirsten, MD  insulin  NPH-regular Human (NOVOLIN 70/30) (70-30) 100 UNIT/ML injection Inject 25-32 Units into the skin daily. Reports 20 units in morning and 10-12 at night if needed. 05/27/15  Yes [provider]  isosorbide  mononitrate (IMDUR ) 30 MG 24 hr tablet Take 0.5 tablets (15 mg total) by mouth daily. 03/29/23  Yes Chandrasekhar, Stanly LABOR, MD  levothyroxine  (SYNTHROID , LEVOTHROID)  50 MCG tablet Take 50 mcg by mouth every morning.  05/31/14  Yes [provider]  methocarbamol  (ROBAXIN ) 500 MG tablet Take 1-2 tablets (500-1,000 mg total) by mouth every 6 (six) hours as needed for muscle spasms. Patient taking differently: Take 500 mg by mouth every 6 (six) hours as needed for muscle spasms. 04/22/18  Yes Silver Laurell Redbird, PA  nitroGLYCERIN  (NITROSTAT ) 0.4 MG SL tablet DISSOLVE 1 TABLET UNDER THE TONGUE EVERY 5 MINUTES AS NEEDED FOR CHEST PAIN UP TO 3 TIMES. CALL EMS IF YOU NEED A SECOND. 10/03/22  Yes Cox, Kirsten, MD  pantoprazole  (PROTONIX ) 40 MG tablet TAKE 1 TABLET EVERY DAY 10/03/22  Yes Cox, Kirsten, MD  potassium chloride  (KLOR-CON ) 10 MEQ tablet Take 10 mEq by mouth daily. 07/20/21  Yes [provider]  ranolazine  (RANEXA ) 500 MG 12 hr tablet TAKE 1 TABLET TWICE DAILY 05/02/23  Yes Cox, Kirsten, MD  TRADJENTA  5 MG TABS tablet TAKE 1 TABLET EVERY DAY 10/03/22  Yes Cox, Kirsten, MD  Alcohol Swabs (ALCOHOL WIPES) 70 %  PADS USE ONE PAD TO CLEAN AREA TO CHECK BLOOD SUGAR AS  DIRECTED 11/24/20   [provider]  Blood Glucose Monitoring Suppl (TRUE METRIX METER) DEVI Use to test blood sugars once daily. ICD: E11.65 11/20/19   [provider]  colchicine  0.6 MG tablet Take 1 tablet (0.6 mg total) by mouth 2 (two) times daily as needed (gout flare up.). 01/29/23 03/29/23  Sirivol, Mamatha, MD  Continuous Blood Gluc Receiver (FREESTYLE LIBRE READER) DEVI 1 each by Does not apply route as directed. 09/29/21   Cox, Abigail, MD  glucose blood (TRUE METRIX BLOOD GLUCOSE TEST) test strip Use as instructed 10/26/21   Cox, Kirsten, MD  TRUEplus Lancets 33G MISC Use to test blood sugar once daily. ICD: E11.65 11/20/19   [provider]                                                                                                                                    Past Surgical History Past Surgical History:  Procedure Laterality Date   ABDOMINAL HYSTERECTOMY     complete   AMPUTATION TOE Right 02/25/2021   hammer toe.   APPENDECTOMY  1959   CARDIAC CATHETERIZATION  02/24/2014   Procedure: CORONARY BALLOON ANGIOPLASTY;  Surgeon: Lonni JONETTA Cash, MD;  Location: [facility] CATH LAB;  Service: Cardiovascular;;   CARDIAC CATHETERIZATION N/A 04/20/2015   Procedure: Left Heart Cath and Coronary Angiography;  Surgeon: Victory LELON Sharps, MD;  Location: [facility] INVASIVE CV LAB;  Service: Cardiovascular;  Laterality: N/A;   CATARACT EXTRACTION W/ INTRAOCULAR LENS  IMPLANT, BILATERAL Bilateral 2013   CESAREAN SECTION  1959; 1960   CHOLECYSTECTOMY  1989   CORONARY ANGIOPLASTY  12/2013   POBA - OM2   CORONARY ARTERY BYPASS GRAFT N/A 04/25/2015   Procedure: CORONARY ARTERY BYPASS GRAFTING (CABG) x four,  using left internal mammary artery  and right leg greater saphenous vein harvested endoscopically;  Surgeon: Dorise MARLA Fellers, MD;  Location: [facility] OR;  Service: Open Heart Surgery;  Laterality: N/A;   JOINT REPLACEMENT Right 10  years ago    right knee (TKR)   LEFT HEART CATHETERIZATION WITH CORONARY ANGIOGRAM N/A 01/22/2014   Procedure: LEFT HEART CATHETERIZATION WITH CORONARY ANGIOGRAM;  Surgeon: Victory LELON Claudene DOUGLAS, MD;  Location: [facility] CATH LAB;  Service: Cardiovascular;  Laterality: N/A;   LEFT HEART CATHETERIZATION WITH CORONARY ANGIOGRAM N/A 02/17/2014   Procedure: LEFT HEART CATHETERIZATION WITH CORONARY ANGIOGRAM;  Surgeon: Lonni JONETTA Cash, MD;  Location: [facility] CATH LAB;  Service: Cardiovascular;  Laterality: N/A;   LEFT HEART CATHETERIZATION WITH CORONARY ANGIOGRAM N/A 02/24/2014   Procedure: LEFT HEART CATHETERIZATION WITH CORONARY ANGIOGRAM;  Surgeon: Lonni JONETTA Cash, MD;  Location: [facility] CATH LAB;  Service: Cardiovascular;  Laterality: N/A;   LUMBAR LAMINECTOMY/DECOMPRESSION MICRODISCECTOMY Left 12/22/2014   Procedure: CENTRAL DECOMPRESSIVE LUMBAR, AND LAMINECTOMY L5-S1,  S1-S2 FOR SPINAL STENOSIS FORAMINOTOMY L5, S1, S2 ROOTS ON LEFT FOR FORAMINAL STENOSIS, AND DECOMPRESSION L5-S1, S1-S2 ACCORDING TO LABELING OF [facility] X-RAYS;  Surgeon: Tanda Heading, MD;  Location: [facility] ORS;  Service: Orthopedics;  Laterality: Left;   PERCUTANEOUS CORONARY STENT INTERVENTION (PCI-S)  02/17/2014   For restenosis of OM2 - PCI with Xience Apline DES 2.25 mm x 12 mm & 2.25 mm x 8 mm overlapping   REDUCTION MAMMAPLASTY Bilateral 1990's   SHOULDER OPEN ROTATOR CUFF REPAIR Bilateral 1980's - 1990's   TEE WITHOUT CARDIOVERSION N/A 04/25/2015   Procedure: TRANSESOPHAGEAL ECHOCARDIOGRAM (TEE);  Surgeon: Dorise MARLA Fellers, MD;  Location: [facility] OR;  Service: Open Heart Surgery;  Laterality: N/A;   TOTAL KNEE ARTHROPLASTY Right 2009   TOTAL KNEE ARTHROPLASTY Left 04/21/2018   TKR;  Surgeon: Rubie Kemps, MD;  Location: [facility] ORS;  Service: Orthopedics;  Laterality: Left;  Adductor Block   Family History Family History  Problem Relation Age of Onset   Hypertension Mother    Diabetes Mother    Heart attack Mother 40   Heart  disease Father    Emphysema Father    Diabetes Brother    Heart disease Brother    Cancer Sister    Stroke Neg Hx     Social History Social History   Tobacco Use   Smoking status: Never   Smokeless tobacco: Never  Vaping Use   Vaping status: Never Used  Substance Use Topics   Alcohol use: No   Drug use: No   Allergies Iodinated contrast media, Atorvastatin , Corticosteroids, Hydrocodone , Other, Oxycodone , Simvastatin, and Cortizone-10 [hydrocortisone]  Review of Systems Review of Systems  Musculoskeletal:  Positive for back pain.  Neurological:  Positive for headaches.    Physical Exam Vital Signs  I have reviewed the triage vital signs BP (!) 118/51 (BP Location: Left Arm)   Pulse 67   Temp 98.3 F (36.8 C) (Oral)   Resp 16   Ht 5' 7.5 (1.715 m)   Wt 85.1 kg   SpO2 91%   BMI 28.95 kg/m   Physical Exam Vitals and nursing note reviewed.  Constitutional:      General: She is not in acute distress.    Appearance: She is well-developed.  HENT:     Head: Normocephalic.     Comments: Small occipital hematoma Eyes:     Conjunctiva/sclera: Conjunctivae normal.  Cardiovascular:     Rate and Rhythm: Normal rate and regular rhythm.     Heart sounds: No murmur  heard. Pulmonary:     Effort: Pulmonary effort is normal. No respiratory distress.     Breath sounds: Normal breath sounds.  Abdominal:     Palpations: Abdomen is soft.     Tenderness: There is no abdominal tenderness.  Musculoskeletal:        General: Tenderness present. No swelling.     Cervical back: Neck supple.  Skin:    General: Skin is warm and dry.     Capillary Refill: Capillary refill takes less than 2 seconds.  Neurological:     Mental Status: She is alert.  Psychiatric:        Mood and Affect: Mood normal.     ED Results and Treatments Labs (all labs ordered are listed, but only abnormal results are displayed) Labs Reviewed  COMPREHENSIVE METABOLIC PANEL - Abnormal; Notable for the  following components:      Result Value   Chloride 97 (*)    Glucose, Bld 146 (*)    BUN 51 (*)    Creatinine, Ser 2.25 (*)    GFR, Estimated 21 (*)    All other components within normal limits  CBC WITH DIFFERENTIAL/PLATELET - Abnormal; Notable for the following components:   WBC 17.4 (*)    Neutro Abs 15.0 (*)    Abs Immature Granulocytes 0.17 (*)    All other components within normal limits  URINALYSIS, W/ REFLEX TO CULTURE (INFECTION SUSPECTED) - Abnormal; Notable for the following components:   APPearance HAZY (*)    Glucose, UA >=500 (*)    Leukocytes,Ua LARGE (*)    Bacteria, UA RARE (*)    All other components within normal limits  CBC - Abnormal; Notable for the following components:   WBC 11.3 (*)    All other components within normal limits  BASIC METABOLIC PANEL - Abnormal; Notable for the following components:   Chloride 96 (*)    Glucose, Bld 230 (*)    BUN 51 (*)    Creatinine, Ser 2.21 (*)    GFR, Estimated 21 (*)    All other components within normal limits  GLUCOSE, CAPILLARY - Abnormal; Notable for the following components:   Glucose-Capillary 148 (*)    All other components within normal limits  GLUCOSE, CAPILLARY - Abnormal; Notable for the following components:   Glucose-Capillary 173 (*)    All other components within normal limits  GLUCOSE, CAPILLARY - Abnormal; Notable for the following components:   Glucose-Capillary 153 (*)    All other components within normal limits  CBG MONITORING, ED - Abnormal; Notable for the following components:   Glucose-Capillary 159 (*)    All other components within normal limits  RESP PANEL BY RT-PCR (RSV, FLU A&B, COVID)  [id number]  URINE CULTURE  TROPONIN I (HIGH SENSITIVITY)                                                                                                                          Radiology US  RENAL  Result Date: 07/05/2023 CLINICAL DATA:  Acute kidney injury EXAM: RENAL / URINARY TRACT ULTRASOUND  COMPLETE COMPARISON:  Noncontrast CT 07/04/2023. FINDINGS: Right Kidney: Renal measurements: 9.4 x 4.3 x 4.0 cm = volume: 82.6 mL. Mild parenchymal atrophy. No collecting system dilatation or perinephric fluid. Left Kidney: Renal measurements: 8.4 x 3.1 x 5.2 cm = volume: 69.8 mL. Global parenchymal atrophy. No collecting system dilatation or perinephric fluid. Bladder: Poorly seen.  This could be collapsed. Other: None IMPRESSION: Bilateral mild renal atrophy. No collecting system dilatation. Poor visualization of the urinary bladder Electronically Signed   By: Ranell Bring M.D.   On: 07/05/2023 13:10   ECHOCARDIOGRAM COMPLETE Result Date: 07/05/2023    ECHOCARDIOGRAM REPORT   Patient Name:   LUWANA BUTRICK Date of Exam: 07/05/2023 Medical Rec #:  [id number]        Height:       67.5 in Accession #:    [id number]       Weight:       187.6 lb Date of Birth:  Oct 10, 1935        BSA:          1.979 m Patient Age:    87 years         BP:           112/82 mmHg Patient Gender: F                HR:           70 bpm. Exam Location:  Inpatient Procedure: 2D Echo, Color Doppler, Cardiac Doppler and Intracardiac            Opacification Agent Indications:    Stroke I63.9  History:        Patient has prior history of Echocardiogram examinations, most                 recent 01/16/2022.  Sonographer:    Tinnie Gosling RDCS Referring Phys: [phone] ANAND D HONGALGI IMPRESSIONS  1. Left ventricular ejection fraction, by estimation, is 60 to 65%. The left ventricle has normal function. The left ventricle has no regional wall motion abnormalities. There is mild concentric left ventricular hypertrophy. Left ventricular diastolic parameters are consistent with Grade I diastolic dysfunction (impaired relaxation).  2. Right ventricular systolic function is normal. The right ventricular size is not well visualized.  3. Left atrial size was moderately dilated.  4. The mitral valve is normal in structure. Trivial mitral valve regurgitation. No  evidence of mitral stenosis.  5. The aortic valve is tricuspid. There is mild calcification of the aortic valve. Aortic valve regurgitation is trivial. Aortic valve sclerosis/calcification is present, without any evidence of aortic stenosis.  6. The inferior vena cava is normal in size with greater than 50% respiratory variability, suggesting right atrial pressure of 3 mmHg. FINDINGS  Left Ventricle: Left ventricular ejection fraction, by estimation, is 60 to 65%. The left ventricle has normal function. The left ventricle has no regional wall motion abnormalities. The left ventricular internal cavity size was normal in size. There is  mild concentric left ventricular hypertrophy. Left ventricular diastolic parameters are consistent with Grade I diastolic dysfunction (impaired relaxation). Right Ventricle: The right ventricular size is not well visualized. No increase in right ventricular wall thickness. Right ventricular systolic function is normal. Left Atrium: Left atrial size was moderately dilated. Right Atrium: Right atrial size was normal in size. Pericardium: There is no evidence of pericardial effusion. Mitral Valve: The mitral valve is  normal in structure. Trivial mitral valve regurgitation. No evidence of mitral valve stenosis. Tricuspid Valve: The tricuspid valve is normal in structure. Tricuspid valve regurgitation is trivial. No evidence of tricuspid stenosis. Aortic Valve: The aortic valve is tricuspid. There is mild calcification of the aortic valve. Aortic valve regurgitation is trivial. Aortic valve sclerosis/calcification is present, without any evidence of aortic stenosis. Pulmonic Valve: The pulmonic valve was normal in structure. Pulmonic valve regurgitation is not visualized. No evidence of pulmonic stenosis. Aorta: The aortic root is normal in size and structure. Venous: The inferior vena cava is normal in size with greater than 50% respiratory variability, suggesting right atrial pressure of  3 mmHg. IAS/Shunts: No atrial level shunt detected by color flow Doppler.  LEFT VENTRICLE PLAX 2D LVIDd:         4.70 cm   Diastology LVIDs:         2.80 cm   LV e' medial:    3.81 cm/s LV PW:         1.10 cm   LV E/e' medial:  21.5 LV IVS:        1.10 cm   LV e' lateral:   7.07 cm/s LVOT diam:     2.30 cm   LV E/e' lateral: 11.6 LV SV:         43 LV SV Index:   22 LVOT Area:     4.15 cm  LEFT ATRIUM           Index LA diam:      3.80 cm 1.92 cm/m LA Vol (A4C): 87.1 ml 44.01 ml/m  AORTIC VALVE LVOT Vmax:   57.50 cm/s LVOT Vmean:  37.000 cm/s LVOT VTI:    0.103 m  AORTA Ao Root diam: 2.90 cm Ao Asc diam:  3.30 cm MITRAL VALVE MV Area (PHT): 2.27 cm     SHUNTS MV E velocity: 82.00 cm/s   Systemic VTI:  0.10 m MV A velocity: 132.00 cm/s  Systemic Diam: 2.30 cm MV E/A ratio:  0.62 Toribio Fuel MD Electronically signed by Toribio Fuel MD Signature Date/Time: 07/05/2023/9:53:58 AM    Final    CT CHEST ABDOMEN PELVIS WO CONTRAST Result Date: 07/04/2023 CLINICAL DATA:  Syncope and fall. EXAM: CT CHEST, ABDOMEN AND PELVIS WITHOUT CONTRAST TECHNIQUE: Multidetector CT imaging of the chest, abdomen and pelvis was performed following the standard protocol without IV contrast. RADIATION DOSE REDUCTION: This exam was performed according to the departmental dose-optimization program which includes automated exposure control, adjustment of the mA and/or kV according to patient size and/or use of iterative reconstruction technique. COMPARISON:  February 24, 2014. FINDINGS: CT CHEST FINDINGS Cardiovascular: Status post coronary artery bypass graft. Atherosclerosis of thoracic aorta without aneurysm. Normal cardiac size. No pericardial effusion. Mediastinum/Nodes: Moderate size hiatal hernia. No adenopathy. Thyroid  gland is unremarkable. Lungs/Pleura: Lungs are clear. No pleural effusion or pneumothorax. Musculoskeletal: No chest wall mass or suspicious bone lesions identified. CT ABDOMEN PELVIS FINDINGS Hepatobiliary: No  focal liver abnormality is seen. Status post cholecystectomy. No biliary dilatation. Pancreas: Unremarkable. No pancreatic ductal dilatation or surrounding inflammatory changes. Spleen: Calcified splenic granulomas are noted. Adrenals/Urinary Tract: Adrenal glands are unremarkable. Kidneys are normal, without renal calculi, focal lesion, or hydronephrosis. Bladder is unremarkable. Stomach/Bowel: There is no evidence of bowel obstruction or inflammation. Sigmoid diverticulosis is noted without inflammation. Moderate sized epigastric ventral hernia is noted which contains a portion of the gastric body, but does not result in obstruction. Status post appendectomy. Vascular/Lymphatic: Aortic atherosclerosis. No  enlarged abdominal or pelvic lymph nodes. Reproductive: Status post hysterectomy. No adnexal masses. Other: No abdominal wall hernia or abnormality. No abdominopelvic ascites. Musculoskeletal: No acute or significant osseous findings. IMPRESSION: No definite traumatic injury seen in the chest, abdomen or pelvis. Moderate size sliding-type hiatal hernia. Moderate size epigastric ventral hernia which contains a portion of the stomach, but does not result in obstruction. Sigmoid diverticulosis without inflammation. Status post coronary artery bypass graft. Aortic Atherosclerosis (ICD10-I70.0). Electronically Signed   By: Lynwood Landy Raddle M.D.   On: 07/04/2023 16:02   CT T-SPINE NO CHARGE Result Date: 07/04/2023 CLINICAL DATA:  Syncope and fall EXAM: CT THORACIC SPINE WITHOUT CONTRAST TECHNIQUE: Multidetector CT images of the thoracic were obtained using the standard protocol without intravenous contrast. RADIATION DOSE REDUCTION: This exam was performed according to the departmental dose-optimization program which includes automated exposure control, adjustment of the mA and/or kV according to patient size and/or use of iterative reconstruction technique. COMPARISON:  No prior CT of the thoracic spine available,  correlation is made with 02/24/2014 CT chest FINDINGS: Alignment: Mild dextrocurvature of the midthoracic spine and levocurvature of the thoracolumbar junction. Exaggeration of the normal thoracic kyphosis. No listhesis. Vertebrae: No acute fracture or focal pathologic process. Paraspinal and other soft tissues: Please see same-day CT chest abdomen pelvis. Disc levels: Mild degenerative changes with some disc height loss but no significant spinal canal stenosis. IMPRESSION: 1. No acute fracture or traumatic listhesis. 2. Mild degenerative changes with some disc height loss but no significant spinal canal stenosis. Electronically Signed   By: Donald Campion M.D.   On: 07/04/2023 16:01   CT HEAD WO CONTRAST ( ) Result Date: 07/04/2023 CLINICAL DATA:  Syncope and fall, hit her head, on blood thinners EXAM: CT HEAD WITHOUT CONTRAST CT CERVICAL SPINE WITHOUT CONTRAST TECHNIQUE: Multidetector CT imaging of the head and cervical spine was performed following the standard protocol without intravenous contrast. Multiplanar CT image reconstructions of the cervical spine were also generated. RADIATION DOSE REDUCTION: This exam was performed according to the departmental dose-optimization program which includes automated exposure control, adjustment of the mA and/or kV according to patient size and/or use of iterative reconstruction technique. COMPARISON:  08/19/2014 CT head, no prior CT cervical spine available FINDINGS: CT HEAD FINDINGS Brain: No evidence of acute infarct, hemorrhage, mass, mass effect, or midline shift. No hydrocephalus or extra-axial fluid collection. 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 Cerebral volume is within normal limits for age. Basal ganglia calcifications. Vascular: No hyperdense vessel. Skull: Negative for fracture or focal lesion. Sinuses/Orbits: Chronic bilateral maxillary sinusitis, with near complete opacification of the left maxillary sinus. Minimal mucosal  thickening in the anterior ethmoid air cells. Status post bilateral lens replacements. Other: The mastoid air cells are well aerated. CT CERVICAL SPINE FINDINGS Alignment: No traumatic listhesis. Skull base and vertebrae: No acute fracture or suspicious osseous lesion. Soft tissues and spinal canal: No prevertebral fluid or swelling. No visible canal hematoma. Disc levels: Degenerative changes in the cervical spine.No high-grade spinal canal stenosis. Upper chest: For findings in the thorax, please see same day CT chest. IMPRESSION: 1. No acute intracranial process. 2. No acute fracture or traumatic listhesis in the cervical spine. Electronically Signed   By: Donald Campion M.D.   On: 07/04/2023 15:59   CT CERVICAL SPINE WO CONTRAST Result Date: 07/04/2023 CLINICAL DATA:  Syncope and fall, hit her head, on blood thinners EXAM: CT HEAD WITHOUT CONTRAST CT CERVICAL SPINE WITHOUT CONTRAST TECHNIQUE: Multidetector  CT imaging of the head and cervical spine was performed following the standard protocol without intravenous contrast. Multiplanar CT image reconstructions of the cervical spine were also generated. RADIATION DOSE REDUCTION: This exam was performed according to the departmental dose-optimization program which includes automated exposure control, adjustment of the mA and/or kV according to patient size and/or use of iterative reconstruction technique. COMPARISON:  08/19/2014 CT head, no prior CT cervical spine available FINDINGS: CT HEAD FINDINGS Brain: No evidence of acute infarct, hemorrhage, mass, mass effect, or midline shift. No hydrocephalus or extra-axial fluid collection. 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 Cerebral volume is within normal limits for age. Basal ganglia calcifications. Vascular: No hyperdense vessel. Skull: Negative for fracture or focal lesion. Sinuses/Orbits: Chronic bilateral maxillary sinusitis, with near complete opacification of the left maxillary  sinus. Minimal mucosal thickening in the anterior ethmoid air cells. Status post bilateral lens replacements. Other: The mastoid air cells are well aerated. CT CERVICAL SPINE FINDINGS Alignment: No traumatic listhesis. Skull base and vertebrae: No acute fracture or suspicious osseous lesion. Soft tissues and spinal canal: No prevertebral fluid or swelling. No visible canal hematoma. Disc levels: Degenerative changes in the cervical spine.No high-grade spinal canal stenosis. Upper chest: For findings in the thorax, please see same day CT chest. IMPRESSION: 1. No acute intracranial process. 2. No acute fracture or traumatic listhesis in the cervical spine. Electronically Signed   By: Donald Campion M.D.   On: 07/04/2023 15:59   DG Pelvis Portable Result Date: 07/04/2023 CLINICAL DATA:  Trauma.  Pelvic pain. EXAM: PORTABLE PELVIS 1-2 VIEWS COMPARISON:  None Available. FINDINGS: Pelvis is intact with normal and symmetric sacroiliac joints. No acute fracture or dislocation. No aggressive osseous lesion. Visualized sacral arcuate lines are unremarkable. Unremarkable symphysis pubis. There are mild degenerative changes of bilateral hip joints without characterized by mild joint space narrowing and osteophytosis of the superior acetabulum. No radiopaque foreign bodies. IMPRESSION: No acute osseous abnormality of the pelvis. Electronically Signed   By: Ree Molt M.D.   On: 07/04/2023 15:23   DG CHEST PORT 1 VIEW Result Date: 07/04/2023 CLINICAL DATA:  Low back pain.  Trauma.  Fall. EXAM: PORTABLE CHEST 1 VIEW COMPARISON:  04/24/2017. FINDINGS: Bilateral lung fields are clear. Bilateral costophrenic angles are clear. Normal cardio-mediastinal silhouette. There are surgical staples along the heart border and sternotomy wires, status post CABG (coronary artery bypass graft). No acute osseous abnormalities. The soft tissues are within normal limits. IMPRESSION: No active disease. Electronically Signed   By: Ree Molt M.D.   On: 07/04/2023 15:23    Pertinent labs & imaging results that were available during my care of the patient were reviewed by me and considered in my medical decision making (see MDM for details).  Medications Ordered in ED Medications  sodium chloride  flush (NS) 0.9 % injection 3 mL (3 mLs Intravenous Given 07/05/23 0758)  heparin  injection 5,000 Units (5,000 Units Subcutaneous Given 07/05/23 1316)  acetaminophen  (TYLENOL ) tablet 650 mg (650 mg Oral Given 07/05/23 0852)    Or  acetaminophen  (TYLENOL ) suppository 650 mg ( Rectal See Alternative 07/05/23 0852)  lactated ringers  infusion ( Intravenous Stopped 07/05/23 0642)  insulin  aspart (novoLOG ) injection 0-15 Units (3 Units Subcutaneous Given 07/05/23 1316)  carvedilol  (COREG ) tablet 6.25 mg (6.25 mg Oral Given 07/05/23 0759)  ezetimibe  (ZETIA ) tablet 10 mg (0 mg Oral Duplicate 07/05/23 0954)  isosorbide  mononitrate (IMDUR ) 24 hr tablet 15 mg (0 mg Oral Duplicate 07/05/23 0954)  ranolazine  (RANEXA ) 12 hr tablet 500 mg (0 mg Oral Duplicate 07/05/23 0954)  levothyroxine  (SYNTHROID ) tablet 50 mcg (50 mcg Oral Given 07/05/23 0627)  pantoprazole  (PROTONIX ) EC tablet 40 mg (0 mg Oral Duplicate 07/05/23 0954)  clopidogrel  (PLAVIX ) tablet 75 mg (0 mg Oral Duplicate 07/05/23 0954)  gabapentin  (NEURONTIN ) capsule 300 mg (0 mg Oral Duplicate 07/05/23 0954)  calcium  carbonate (OS-CAL - dosed in mg of elemental calcium ) tablet 625 mg (625 mg Oral Given 07/05/23 0758)  perflutren  lipid microspheres (DEFINITY ) IV suspension (4 mLs Intravenous Given 07/05/23 0953)  lactated ringers  infusion ( Intravenous New Bag/Given 07/05/23 1202)  fentaNYL  (SUBLIMAZE ) injection 25 mcg (25 mcg Intravenous Given 07/04/23 1611)  ondansetron  (ZOFRAN ) injection 4 mg (4 mg Intravenous Given 07/04/23 1609)  lactated ringers  bolus 1,000 mL (0 mLs Intravenous Stopped 07/04/23 1827)                                                                                                                                      Procedures .Critical Care  Performed by: Albertina Dixon, MD Authorized by: Albertina Dixon, MD   Critical care provider statement:    Critical care time (minutes):  30   Critical care was necessary to treat or prevent imminent or life-threatening deterioration of the following conditions:  Trauma   Critical care was time spent personally by me on the following activities:  Development of treatment plan with patient or surrogate, discussions with consultants, evaluation of patient's response to treatment, examination of patient, ordering and review of laboratory studies, ordering and review of radiographic studies, ordering and performing treatments and interventions, pulse oximetry, re-evaluation of patient's condition and review of old charts   (including critical care time)  Medical Decision Making / ED Course   This patient presents to the ED for concern of fall on blood thinners, syncope, this involves an extensive number of treatment options, and is a complaint that carries with it a high risk of complications and morbidity.  The differential diagnosis includes fracture, contusion, hematoma, ligamentous injury, closed head injury, ICH, laceration, intrathoracic injury, intra-abdominal injury, orthostatic syncope, cardiogenic syncope, vasovagal syncope, electrolyte abnormality, dehydration, dysrhythmia, vasovagal, Hypoglycemia, Seizure, Autonomic Insufficiency  MDM: Patient seen emergency room for evaluation of a fall on blood thinners and syncope.  Patient arrives as a level 2 trauma and primary survey is unremarkable.  Secondary survey with a small occipital hematoma with tenderness, abrasions over the T-spine with tenderness and tenderness in the L-spine.  No additional trauma to the chest abdomen or pelvis seen on exam.  Laboratory evaluation with a new creatinine elevation to 2.25 with a BUN of 51 which is elevated from 2 weeks ago.  Mild leukocytosis to  17.4.  Trauma imaging including CT head, C-spine, chest abdomen pelvis reassuringly negative for acute traumatic injury.  At time of signout, patient pending fluid resuscitation and orthostatic testing.  Please see provider signout note for continuation  of workup.  Additional history obtained: -Additional history obtained from son -External records from outside source obtained and reviewed including: Chart review including previous notes, labs, imaging, consultation notes   Lab Tests: -I ordered, reviewed, and interpreted labs.   The pertinent results include:   Labs Reviewed  COMPREHENSIVE METABOLIC PANEL - Abnormal; Notable for the following components:      Result Value   Chloride 97 (*)    Glucose, Bld 146 (*)    BUN 51 (*)    Creatinine, Ser 2.25 (*)    GFR, Estimated 21 (*)    All other components within normal limits  CBC WITH DIFFERENTIAL/PLATELET - Abnormal; Notable for the following components:   WBC 17.4 (*)    Neutro Abs 15.0 (*)    Abs Immature Granulocytes 0.17 (*)    All other components within normal limits  URINALYSIS, W/ REFLEX TO CULTURE (INFECTION SUSPECTED) - Abnormal; Notable for the following components:   APPearance HAZY (*)    Glucose, UA >=500 (*)    Leukocytes,Ua LARGE (*)    Bacteria, UA RARE (*)    All other components within normal limits  CBC - Abnormal; Notable for the following components:   WBC 11.3 (*)    All other components within normal limits  BASIC METABOLIC PANEL - Abnormal; Notable for the following components:   Chloride 96 (*)    Glucose, Bld 230 (*)    BUN 51 (*)    Creatinine, Ser 2.21 (*)    GFR, Estimated 21 (*)    All other components within normal limits  GLUCOSE, CAPILLARY - Abnormal; Notable for the following components:   Glucose-Capillary 148 (*)    All other components within normal limits  GLUCOSE, CAPILLARY - Abnormal; Notable for the following components:   Glucose-Capillary 173 (*)    All other components within  normal limits  GLUCOSE, CAPILLARY - Abnormal; Notable for the following components:   Glucose-Capillary 153 (*)    All other components within normal limits  CBG MONITORING, ED - Abnormal; Notable for the following components:   Glucose-Capillary 159 (*)    All other components within normal limits  RESP PANEL BY RT-PCR (RSV, FLU A&B, COVID)  [id number]  URINE CULTURE  TROPONIN I (HIGH SENSITIVITY)      EKG   EKG Interpretation Date/Time:  Thursday July 04 2023 14:18:13 EST Ventricular Rate:  62 PR Interval:  77 QRS Duration:  109 QT Interval:  460 QTC Calculation: 468 R Axis:   0  Text Interpretation: Sinus rhythm Short PR interval Probable left atrial enlargement Borderline low voltage, extremity leads Consider anterior infarct Confirmed by Cottie Cough [phone]) on 07/05/2023 7:19:48 AM         Imaging Studies ordered: I ordered imaging studies including chest x-ray, pelvis x-ray, CT head, C-spine, chest abdomen pelvis I independently visualized and interpreted imaging. I agree with the radiologist interpretation   Medicines ordered and prescription drug management: Meds ordered this encounter  Medications   fentaNYL  (SUBLIMAZE ) injection 25 mcg   ondansetron  (ZOFRAN ) injection 4 mg   lactated ringers  bolus 1,000 mL   sodium chloride  flush (NS) 0.9 % injection 3 mL   heparin  injection 5,000 Units   OR Linked Order Group    acetaminophen  (TYLENOL ) tablet 650 mg    acetaminophen  (TYLENOL ) suppository 650 mg   lactated ringers  infusion   insulin  aspart (novoLOG ) injection 0-15 Units    Correction coverage::   Moderate (average weight, post-op)    CBG <  70::   Implement Hypoglycemia Standing Orders and refer to Hypoglycemia Standing Orders sidebar report    CBG 70 - 120::   0 units    CBG 121 - 150::   2 units    CBG 151 - 200::   3 units    CBG 201 - 250::   5 units    CBG 251 - 300::   8 units    CBG 301 - 350::   11 units    CBG 351 - 400::   15 units    CBG  > 400:   call MD and obtain STAT lab verification   carvedilol  (COREG ) tablet 6.25 mg   ezetimibe  (ZETIA ) tablet 10 mg   isosorbide  mononitrate (IMDUR ) 24 hr tablet 15 mg   ranolazine  (RANEXA ) 12 hr tablet 500 mg   levothyroxine  (SYNTHROID ) tablet 50 mcg   pantoprazole  (PROTONIX ) EC tablet 40 mg   clopidogrel  (PLAVIX ) tablet 75 mg   gabapentin  (NEURONTIN ) capsule 300 mg   calcium  carbonate (OS-CAL - dosed in mg of elemental calcium ) tablet 625 mg   perflutren  lipid microspheres (DEFINITY ) IV suspension   lactated ringers  infusion    -I have reviewed the patients home medicines and have made adjustments as needed  Critical interventions Trauma activation and evaluation, fluid resuscitation    Cardiac Monitoring: The patient was maintained on a cardiac monitor.  I personally viewed and interpreted the cardiac monitored which showed an underlying rhythm of: NSR  Social Determinants of Health:  Factors impacting patients care include: none   Reevaluation: After the interventions noted above, I reevaluated the patient and found that they have :improved  Co morbidities that complicate the patient evaluation  Past Medical History:  Diagnosis Date   Anemia    a. Noted on labs 02/2014 possibly procedurally related.   Asthma    CAD S/P percutaneous coronary angioplasty 01/22/2014   A. (12/2013): POBA - OM2 99% (very tortuous segment) - reduced ~ 50% & TIMI 3 flow, 80-90% stenosis in mid PDA, Irregularities <50% in mRCA, pLAD and prox LCx; b. 02/17/14: Moderate sized fixed defect in Lateral wall --> cath with OM2 restenosis & otherwise stable --> Xience Alpine DES x 2 (2.25 mm x 12 & 8 mm). c. NSTEMI 02/2014 s/p PTCA/balloon angioplasty only to small caliber PDA.   Carotid stenosis    Carotid US  11/17: L 1-39; FU prn   Chronic systolic heart failure (HCC)    a. ICM b. ECHO (01/2014): EF 25-30%, grade I DD, trivial MR. c. EF by Myoview  02/17/14 = ~53%. d. EF by cath 02/24/14 - improved to  50-55%.   CKD (chronic kidney disease), stage III (HCC)    Contrast media allergy    Environmental allergies    GERD (gastroesophageal reflux disease)    Gout, unspecified    HOH (hard of hearing)    Hyperlipidemia    a. H/o intolerance to Zocor, Lipitor . Had muscle aches on higher dose Crestor .   Hypertension    Hypothyroidism    LBBB (left bundle branch block)    a. Transient during 02/2014 admission.   NSTEMI (non-ST elevated myocardial infarction) (HCC) 2014; 12/2013, 01/2014   QT prolongation    a. Noted on EKG 02/2014.   Sinus bradycardia    a. HR 40s-50s during 02/2014 admission, limiting BB dose.   TIA (transient ischemic attack) 1990's   a. TIA vs stroke 1990's mild - sounds like a TIA as she was told she had stroke symptoms but negative  scans. Denies residual effects.   Type II diabetes mellitus (HCC)       Dispostion: I considered admission for this patient, and patient pending reevaluation by oncoming provider after fluid resuscitation.  Please see provider signout note for continuation of workup     Final Clinical Impression(s) / ED Diagnoses Final diagnoses:  Syncope, unspecified syncope type  AKI (acute kidney injury) (HCC)  Leukocytosis, unspecified type     @PCDICTATION @    Albertina Dixon, MD 07/05/23 1408

## 2023-07-05 NOTE — Care Management Obs Status (Signed)
 MEDICARE OBSERVATION STATUS NOTIFICATION   Patient Details  Name: Carol [name] MRN: [id number] Date of Birth: Jan 21, 1936   Medicare Observation Status Notification Given:  Yes (PAteint asleep and unarousable RNCM contacted Son, [name]  He verbalized understanding and authorized signature Letter left in room per Sn's request)    [name], RN 07/05/2023, 12:12 PM

## 2023-07-05 NOTE — Evaluation (Signed)
 Occupational Therapy Evaluation Patient Details Name: Carol [name] MRN: [id number] DOB: 1935/11/22 Today's Date: 07/05/2023   History of Present Illness Carol [name] is a 88 y.o. female admitted 1/9 who presents with syncope and fall.  Reports multiple falls over the last year.  PMH: CAD s/p stent and CABG, HFrEF, HTN, T2DM, asthma, hypothyroidism, CKD4   Clinical Impression   Carol [name] was evaluated s/p the above admission list. She lives alone and is mod I at baseline, however family can assist as needed at discharge. Upon evaluation the pt was limited by dizziness, chronic back pain, known orthostatic hypotension and limited activity tolerance. Overall she needs min A for bed mobility and did not attempt standing due to dizziness on the EOB. Due to the deficits listed below the pt also needs up to mod A for LB ADLs and set up A for UB ADLs in sitting. Ted Hose dependently donned. Pt will benefit from continued acute OT services and skilled inpatient follow up therapy, <3 hours/day - however, if BP is controlled, anticipate pt could discharge home with HHOT.        If plan is discharge home, recommend the following: A little help with walking and/or transfers;A lot of help with bathing/dressing/bathroom;Assistance with cooking/housework;Direct supervision/assist for medications management;Direct supervision/assist for financial management;Assist for transportation;Help with stairs or ramp for entrance    Functional Status Assessment  Patient has had a recent decline in their functional status and demonstrates the ability to make significant improvements in function in a reasonable and predictable amount of time.  Equipment Recommendations  BSC/3in1;Other (comment) (RW)    Recommendations for Other Services       Precautions / Restrictions Precautions Precautions: Fall Restrictions Weight Bearing Restrictions Per Provider Order: No      Mobility Bed Mobility Overal bed  mobility: Needs Assistance Bed Mobility: Rolling, Sidelying to Sit, Sit to Sidelying Rolling: Min assist Sidelying to sit: Min assist     Sit to sidelying: Min assist      Transfers                   General transfer comment: no attempt to stand due to dizziness and known orthostatic hypotension      Balance Overall balance assessment: Needs assistance Sitting-balance support: No upper extremity supported, Feet supported Sitting balance-Leahy Scale: Fair                                     ADL either performed or assessed with clinical judgement   ADL Overall ADL's : Needs assistance/impaired Eating/Feeding: Independent   Grooming: Set up   Upper Body Bathing: Set up   Lower Body Bathing: Moderate assistance;Sit to/from stand   Upper Body Dressing : Set up;Sitting   Lower Body Dressing: Moderate assistance Lower Body Dressing Details (indicate cue type and reason): total A for compression stockings Toilet Transfer: Minimal assistance Toilet Transfer Details (indicate cue type and reason): limited by dizziness Toileting- Clothing Manipulation and Hygiene: Contact guard assist;Sitting/lateral lean       Functional mobility during ADLs: Minimal assistance General ADL Comments: limited by chronic back pain, dizziness and orthostatic BP     Vision Baseline Vision/History: 0 No visual deficits Vision Assessment?: No apparent visual deficits     Perception Perception: Within Functional Limits       Praxis Praxis: WFL       Pertinent Vitals/Pain Pain Assessment Pain Assessment:  Faces Faces Pain Scale: Hurts a little bit Pain Location: back Pain Descriptors / Indicators: Aching, Discomfort, Grimacing, Guarding Pain Intervention(s): Limited activity within patient's tolerance, Monitored during session     Extremity/Trunk Assessment Upper Extremity Assessment Upper Extremity Assessment: Generalized weakness   Lower Extremity  Assessment Lower Extremity Assessment: Defer to PT evaluation   Cervical / Trunk Assessment Cervical / Trunk Assessment: Kyphotic   Communication Communication Communication: Hearing impairment   Cognition Arousal: Alert Behavior During Therapy: WFL for tasks assessed/performed Overall Cognitive Status: Within Functional Limits for tasks assessed                                       General Comments  VSS, ted hose donned    Exercises     Shoulder Instructions      Home Living Family/patient expects to be discharged to:: Private residence Living Arrangements: Alone Available Help at Discharge: Family;Available 24 hours/day;Friend(s) Type of Home: House Home Access: Stairs to enter [employer] of Steps: 1 Entrance Stairs-Rails: None Home Layout: One level     Bathroom Shower/Tub: Producer, Television/film/video: Handicapped height Bathroom Accessibility: Yes   Home Equipment: BSC/3in1;Grab bars - tub/shower   Additional Comments: lives alone typically, family can assist as needed at discharge      Prior Functioning/Environment Prior Level of Function : Independent/Modified Independent;Driving;History of Falls (last six months)             Mobility Comments: dizzy spells all last year off and on with a few episodes ADLs Comments: Was able to complete with difficulty and incr time per pt; cooks meals, manages meds, drives        OT Problem List: Decreased strength;Decreased range of motion;Decreased activity tolerance;Impaired balance (sitting and/or standing);Decreased safety awareness;Decreased knowledge of precautions;Decreased knowledge of use of DME or AE      OT Treatment/Interventions: Self-care/ADL training;Therapeutic exercise;DME and/or AE instruction;Therapeutic activities;Patient/family education;Balance training    OT Goals(Current goals can be found in the care plan section) Acute Rehab OT Goals Patient Stated Goal:  home soon OT Goal Formulation: With patient Time For Goal Achievement: 07/19/23 Potential to Achieve Goals: Good ADL Goals Pt Will Perform Grooming: with supervision;standing Pt Will Perform Lower Body Dressing: with set-up;sit to/from stand Pt Will Transfer to Toilet: with supervision;ambulating Additional ADL Goal #1: Pt will tolerate at least 8 minutes of OOB activity to demonstrate improved endurance for discharge home  OT Frequency: Min 1X/week    Co-evaluation              AM-PAC OT 6 Clicks Daily Activity     Outcome Measure Help from another person eating meals?: None Help from another person taking care of personal grooming?: A Little Help from another person toileting, which includes using toliet, bedpan, or urinal?: A Little Help from another person bathing (including washing, rinsing, drying)?: A Lot Help from another person to put on and taking off regular upper body clothing?: A Little Help from another person to put on and taking off regular lower body clothing?: A Lot 6 Click Score: 17   End of Session Nurse Communication: Mobility status  Activity Tolerance: Patient tolerated treatment well Patient left: in bed;with call bell/phone within reach;with bed alarm set  OT Visit Diagnosis: Unsteadiness on feet (R26.81);Other abnormalities of gait and mobility (R26.89);Muscle weakness (generalized) (M62.81);Pain  Time: 1531-1550 OT Time Calculation (min): 19 min Charges:  OT General Charges $OT Visit: 1 Visit OT Evaluation $OT Eval Moderate Complexity: 1 Mod  Carol [name], OTR/L Acute Rehabilitation Services Office [phone] Secure Chat Communication Preferred   Carol [name] 07/05/2023, 4:12 PM

## 2023-07-05 NOTE — Evaluation (Signed)
 Physical Therapy Evaluation Patient Details Name: Carol [name] MRN: [id number] DOB: December 27, 1935 Today's Date: 07/05/2023  History of Present Illness  Carol [name] is a 88 y.o. female admitted 1/9 who presents with syncope and fall.  Reports multiple falls over the last year.  PMH: CAD s/p stent and CABG, HFrEF, HTN, T2DM, asthma, hypothyroidism, CKD4  Clinical Impression  Pt admitted with above diagnosis. Pt was able to stand at EOB but was orthostatic with each position with dizziness reported.  See VS flowsheet.  Notified nurse and MD regarding VS and also asked nurse to bring pt back pain medications as pt was in pain. Will follow acutely.  Pt currently with functional limitations due to the deficits listed below (see PT Problem List). Pt will benefit from acute skilled PT to increase their independence and safety with mobility to allow discharge.           If plan is discharge home, recommend the following: A lot of help with walking and/or transfers;A little help with bathing/dressing/bathroom;Assistance with cooking/housework;Assist for transportation;Help with stairs or ramp for entrance   Can travel by private vehicle   No    Equipment Recommendations Rolling walker (2 wheels)  Recommendations for Other Services       Functional Status Assessment Patient has had a recent decline in their functional status and demonstrates the ability to make significant improvements in function in a reasonable and predictable amount of time.     Precautions / Restrictions Precautions Precautions: Fall Restrictions Weight Bearing Restrictions Per Provider Order: No      Mobility  Bed Mobility Overal bed mobility: Needs Assistance Bed Mobility: Rolling, Sidelying to Sit, Sit to Sidelying Rolling: Min assist Sidelying to sit: Min assist     Sit to sidelying: Mod assist General bed mobility comments: Pt c/o back pain therefore educated pt in log roll for incr back comfort. Pt  needed assist and cues for technique with rolling, side to sit and sit to sidelying.    Transfers Overall transfer level: Needs assistance Equipment used: Rolling walker (2 wheels) Transfers: Sit to/from Stand Sit to Stand: Min assist           General transfer comment: Able to stand to RW and stand for orthostatic VS.  Pt was orthostatic and VS relayed to nurse and MD.  they are in VS flowsheet.  HAd to assist pt back to bed.    Ambulation/Gait               General Gait Details: NT due to orthostatic VS  Stairs            Wheelchair Mobility     Tilt Bed    Modified Rankin (Stroke Patients Only)       Balance Overall balance assessment: Needs assistance Sitting-balance support: No upper extremity supported, Feet supported Sitting balance-Leahy Scale: Fair     Standing balance support: Bilateral upper extremity supported, During functional activity Standing balance-Leahy Scale: Poor                               Pertinent Vitals/Pain Pain Assessment Pain Assessment: Faces Pain Score: 6  Faces Pain Scale: Hurts even more Pain Location: back Pain Descriptors / Indicators: Aching, Discomfort, Grimacing, Guarding Pain Intervention(s): Limited activity within patient's tolerance, Monitored during session, Repositioned, Patient requesting pain meds-RN notified    Home Living Family/patient expects to be discharged to:: Private residence Living Arrangements: Alone  Available Help at Discharge: Family;Available 24 hours/day;Friend(s) (son and friends) Type of Home: House Home Access: Stairs to enter Entrance Stairs-Rails: None Entrance Stairs-Number of Steps: 1   Home Layout: One level Home Equipment: BSC/3in1;Grab bars - tub/shower      Prior Function Prior Level of Function : Independent/Modified Independent;Driving;History of Falls (last six months)             Mobility Comments: dizzy spells all last year off and on with a few  episodes ADLs Comments: Was able to complete with difficulty and incr time per pt     Extremity/Trunk Assessment   Upper Extremity Assessment Upper Extremity Assessment: Defer to OT evaluation    Lower Extremity Assessment Lower Extremity Assessment: Generalized weakness    Cervical / Trunk Assessment Cervical / Trunk Assessment: Kyphotic  Communication   Communication Communication: Hearing impairment Cueing Techniques: Tactile cues;Verbal cues;Visual cues;Gestural cues  Cognition Arousal: Alert Behavior During Therapy: WFL for tasks assessed/performed Overall Cognitive Status: Within Functional Limits for tasks assessed                                          General Comments      Exercises General Exercises - Lower Extremity Ankle Circles/Pumps: AROM, Both, 10 reps, Supine Long Arc Quad: AROM, Both, 10 reps, Seated   Assessment/Plan    PT Assessment Patient needs continued PT services  PT Problem List Decreased activity tolerance;Decreased balance;Decreased mobility;Decreased knowledge of use of DME;Decreased safety awareness;Decreased knowledge of precautions;Pain       PT Treatment Interventions DME instruction;Gait training;Functional mobility training;Therapeutic activities;Therapeutic exercise;Balance training;Patient/family education    PT Goals (Current goals can be found in the Care Plan section)  Acute Rehab PT Goals Patient Stated Goal: to go home PT Goal Formulation: With patient Time For Goal Achievement: 07/19/23 Potential to Achieve Goals: Good    Frequency Min 1X/week     Co-evaluation               AM-PAC PT 6 Clicks Mobility  Outcome Measure Help needed turning from your back to your side while in a flat bed without using bedrails?: A Little Help needed moving from lying on your back to sitting on the side of a flat bed without using bedrails?: A Lot Help needed moving to and from a bed to a chair (including a  wheelchair)?: A Lot Help needed standing up from a chair using your arms (e.g., wheelchair or bedside chair)?: A Little Help needed to walk in hospital room?: A Lot Help needed climbing 3-5 steps with a railing? : Total 6 Click Score: 13    End of Session Equipment Utilized During Treatment: Gait belt Activity Tolerance: Patient limited by fatigue Patient left: with call bell/phone within reach;in bed Nurse Communication: Mobility status;Patient requests pain meds PT Visit Diagnosis: Unsteadiness on feet (R26.81);Muscle weakness (generalized) (M62.81);Pain Pain - part of body:  (back)    Time: 9193-9169 PT Time Calculation (min) (ACUTE ONLY): 24 min   Charges:   PT Evaluation $PT Eval Moderate Complexity: 1 Mod PT Treatments $Therapeutic Activity: 8-22 mins PT General Charges $$ ACUTE PT VISIT: 1 Visit         Shatina Streets M,PT Acute Rehab Services [phone]   Stephane JULIANNA Bevel 07/05/2023, 9:16 AM

## 2023-07-05 NOTE — Progress Notes (Signed)
  Echocardiogram 2D Echocardiogram has been performed.  Leda Roys RDCS 07/05/2023, 9:53 AM

## 2023-07-05 NOTE — Progress Notes (Addendum)
 PROGRESS NOTE   RYLAN KAUFMANN  FMW:[id number]    DOB: 1936/03/04    DOA: 07/04/2023  PCP: Sherre Clapper, MD   I have briefly reviewed patients previous medical records in [facility].  Chief Complaint  Patient presents with   Loss of Consciousness    Brief Hospital Course:  88 y.o. female with medical history significant of CAD s/p stent and CABG, HFrEF, HTN, T2DM, asthma, hypothyroidism, CKD4, hard of hearing who presents with syncope and fall.  Admitted for syncope, fall, orthostatic hypotension and acute on chronic kidney disease, suspected due to poor oral intake for several days PTA.   Assessment & Plan:  Principal Problem:   Syncope Active Problems:   Acquired hypothyroidism   Hx of CABG   AKI (acute kidney injury) (HCC)   Type 2 diabetes mellitus with chronic kidney disease, with long-term current use of insulin  (HCC)   Syncope and fall/collapse Suspected due to orthostatic hypotension from dehydration due to poor oral intake for several days PTA. Clearly orthostatic on 1/10: Supine BP 144/64, standing BP: 86/62 Telemetry shows sinus rhythm with first-degree AV block and no arrhythmias 2D echo: LVEF 60-65% grade 1 diastolic dysfunction.  No aortic stenosis. HS Troponin negative. Treat orthostatic hypotension as below. PT recommends SNF, TOC consulted. Driving restrictions to be counseled at time of discharge. Extensive imaging (CT C/A/P, CT C and L-spine, CT head, x-ray pelvis) without acute findings.  Orthostatic hypotension/dehydration Due to several days history of nausea, dry heaves, poor oral intake due to feeling sick IV fluids, bilateral TED hose and monitor Recheck orthostatic vital signs every shift and in AM.  Acute kidney injury complicating stage IV CKD Most recent baseline creatinine of 1.78 on 12/27. Presented with creatinine of 2.25. AKI secondary to dehydration. Renal ultrasound: Bilateral mild renal atrophy. No collecting system dilatation.  Poor visualization of the urinary bladder Brief IV fluids and trend daily BMP.  Type 2 diabetes mellitus with chronic kidney disease, with long-term current use of insulin  (HCC) -Last A1c 7.9 on 12/27 suggesting poor outpatient control. -Moderate SSI.  Well-controlled here.  Essential hypertension: Controlled on carvedilol , Imdur .   Hx of CABG - Patient complaining of posterior neck pain which feels similar to her prior MI.  However, her pain was improved once her neck was adjusted with additional pillows suggesting more MSK in origin.  Her troponin and EKG also reassuring at this time. -Low suspicion of cardiogenic cause of her syncope.  -continue Plavix , Imdur , current Talal and Ranexa    Acquired hypothyroidism - Continue levothyroxine .  TSH 2.750 in August 2024   Body mass index is 28.95 kg/m.   DVT prophylaxis: Place TED hose Start: 07/05/23 1146 heparin  injection 5,000 Units Start: 07/04/23 2200     Code Status: Limited: Do not attempt resuscitation (DNR) -DNR-LIMITED -Do Not Intubate/DNI :  Family Communication: son via phone. Disposition:  Status is: Inpatient Remains inpatient appropriate because: AKI, ALT static hypotension and need for IV fluids     Consultants:     Procedures:     Antimicrobials:      Subjective:  Patient reports that over the last several days had generally been feeling unwell without specific complaints and with that she had poor oral intake.  Lives alone and is independent.  Indicates that when she was up with therapy today felt dizzy.  No nausea, vomiting or abdominal pain here.  Objective:   Vitals:   07/05/23 0323 07/05/23 0804 07/05/23 0826 07/05/23 1158  BP: (!) 177/55 ROLLEN)  144/57 112/82 (!) 118/51  Pulse: 72 69 75 67  Resp: 18 16  16   Temp: 98.2 F (36.8 C) 97.7 F (36.5 C)  98.3 F (36.8 C)  TempSrc: Oral Oral  Oral  SpO2: 92% 94%  91%  Weight:      Height:        General exam: Elderly female, moderately built and  frail lying comfortably propped up in bed without distress.  Oral mucosa dry.  Extremely hard of hearing.  Does not have her hearing aid. Respiratory system: Clear to auscultation. Respiratory effort normal. Cardiovascular system: S1 & S2 heard, RRR. No JVD, murmurs, rubs, gallops or clicks. No pedal edema.  Telemetry personally reviewed: Sinus rhythm with first-degree AV block. Gastrointestinal system: Abdomen is nondistended, soft and nontender. No organomegaly or masses felt. Normal bowel sounds heard. Central nervous system: Alert and oriented. No focal neurological deficits. Extremities: Symmetric 5 x 5 power.  Some bruising of her upper extremities. Skin: No rashes, lesions or ulcers Psychiatry: Judgement and insight appear normal. Mood & affect appropriate.     Data Reviewed:   I have personally reviewed following labs and imaging studies   CBC: Recent Labs  Lab 07/04/23 1503 07/05/23 0730  WBC 17.4* 11.3*  NEUTROABS 15.0*  --   HGB 13.5 12.4  HCT 41.3 37.4  MCV 96.3 96.1  PLT 265 220    Basic Metabolic Panel: Recent Labs  Lab 07/04/23 1503 07/05/23 0730  NA 138 137  K 4.6 4.6  CL 97* 96*  CO2 27 26  GLUCOSE 146* 230*  BUN 51* 51*  CREATININE 2.25* 2.21*  CALCIUM  10.0 9.4    Liver Function Tests: Recent Labs  Lab 07/04/23 1503  AST 28  ALT 18  ALKPHOS 126  BILITOT 0.9  PROT 7.3  ALBUMIN  4.0    CBG: Recent Labs  Lab 07/05/23 0035 07/05/23 0633 07/05/23 1211  GLUCAP 148* 173* 153*    Microbiology Studies:   Recent Results (from the past 240 hours)  Resp panel by RT-PCR (RSV, Flu A&B, Covid) Urine, Clean Catch     Status: None   Collection Time: 07/04/23  8:43 PM   Specimen: Urine, Clean Catch; Nasal Swab  Result Value Ref Range Status   SARS Coronavirus 2 by RT PCR NEGATIVE NEGATIVE Final   Influenza A by PCR NEGATIVE NEGATIVE Final   Influenza B by PCR NEGATIVE NEGATIVE Final    Comment: (NOTE) The Xpert Xpress SARS-CoV-2/FLU/RSV plus  assay is intended as an aid in the diagnosis of influenza from Nasopharyngeal swab specimens and should not be used as a sole basis for treatment. Nasal washings and aspirates are unacceptable for Xpert Xpress SARS-CoV-2/FLU/RSV testing.  Fact Sheet for Patients: bloggercourse.com  Fact Sheet for Healthcare Providers: seriousbroker.it  This test is not yet approved or cleared by the [place]  FDA and has been authorized for detection and/or diagnosis of SARS-CoV-2 by FDA under an Emergency Use Authorization (EUA). This EUA will remain in effect (meaning this test can be used) for the duration of the COVID-19 declaration under Section 564(b)(1) of the Act, 21 U.S.C. section 360bbb-3(b)(1), unless the authorization is terminated or revoked.     Resp Syncytial Virus by PCR NEGATIVE NEGATIVE Final    Comment: (NOTE) Fact Sheet for Patients: bloggercourse.com  Fact Sheet for Healthcare Providers: seriousbroker.it  This test is not yet approved or cleared by the [place]  FDA and has been authorized for detection and/or diagnosis of SARS-CoV-2 by FDA under an  Emergency Use Authorization (EUA). This EUA will remain in effect (meaning this test can be used) for the duration of the COVID-19 declaration under Section 564(b)(1) of the Act, 21 U.S.C. section 360bbb-3(b)(1), unless the authorization is terminated or revoked.  Performed at [facility], [address] [place], [place] [address]     Radiology Studies:  US  RENAL Result Date: 07/05/2023 CLINICAL DATA:  Acute kidney injury EXAM: RENAL / URINARY TRACT ULTRASOUND COMPLETE COMPARISON:  Noncontrast CT 07/04/2023. FINDINGS: Right Kidney: Renal measurements: 9.4 x 4.3 x 4.0 cm = volume: 82.6 mL. Mild parenchymal atrophy. No collecting system dilatation or perinephric fluid. Left Kidney: Renal measurements: 8.4 x 3.1 x  5.2 cm = volume: 69.8 mL. Global parenchymal atrophy. No collecting system dilatation or perinephric fluid. Bladder: Poorly seen.  This could be collapsed. Other: None IMPRESSION: Bilateral mild renal atrophy. No collecting system dilatation. Poor visualization of the urinary bladder Electronically Signed   By: Ranell Bring M.D.   On: 07/05/2023 13:10   ECHOCARDIOGRAM COMPLETE Result Date: 07/05/2023    ECHOCARDIOGRAM REPORT   Patient Name:   ZELMA SNEAD Date of Exam: 07/05/2023 Medical Rec #:  [id number]        Height:       67.5 in Accession #:    [id number]       Weight:       187.6 lb Date of Birth:  [birth year]        BSA:          1.979 m Patient Age:    87 years         BP:           112/82 mmHg Patient Gender: F                HR:           70 bpm. Exam Location:  Inpatient Procedure: 2D Echo, Color Doppler, Cardiac Doppler and Intracardiac            Opacification Agent Indications:    Stroke I63.9  History:        Patient has prior history of Echocardiogram examinations, most                 recent 01/16/2022.  Sonographer:    Tinnie Gosling RDCS Referring Phys: [phone] Loy Little D Rozalyn Osland IMPRESSIONS  1. Left ventricular ejection fraction, by estimation, is 60 to 65%. The left ventricle has normal function. The left ventricle has no regional wall motion abnormalities. There is mild concentric left ventricular hypertrophy. Left ventricular diastolic parameters are consistent with Grade I diastolic dysfunction (impaired relaxation).  2. Right ventricular systolic function is normal. The right ventricular size is not well visualized.  3. Left atrial size was moderately dilated.  4. The mitral valve is normal in structure. Trivial mitral valve regurgitation. No evidence of mitral stenosis.  5. The aortic valve is tricuspid. There is mild calcification of the aortic valve. Aortic valve regurgitation is trivial. Aortic valve sclerosis/calcification is present, without any evidence of aortic stenosis.  6. The  inferior vena cava is normal in size with greater than 50% respiratory variability, suggesting right atrial pressure of 3 mmHg. FINDINGS  Left Ventricle: Left ventricular ejection fraction, by estimation, is 60 to 65%. The left ventricle has normal function. The left ventricle has no regional wall motion abnormalities. The left ventricular internal cavity size was normal in size. There is  mild concentric left ventricular hypertrophy. Left ventricular diastolic parameters  are consistent with Grade I diastolic dysfunction (impaired relaxation). Right Ventricle: The right ventricular size is not well visualized. No increase in right ventricular wall thickness. Right ventricular systolic function is normal. Left Atrium: Left atrial size was moderately dilated. Right Atrium: Right atrial size was normal in size. Pericardium: There is no evidence of pericardial effusion. Mitral Valve: The mitral valve is normal in structure. Trivial mitral valve regurgitation. No evidence of mitral valve stenosis. Tricuspid Valve: The tricuspid valve is normal in structure. Tricuspid valve regurgitation is trivial. No evidence of tricuspid stenosis. Aortic Valve: The aortic valve is tricuspid. There is mild calcification of the aortic valve. Aortic valve regurgitation is trivial. Aortic valve sclerosis/calcification is present, without any evidence of aortic stenosis. Pulmonic Valve: The pulmonic valve was normal in structure. Pulmonic valve regurgitation is not visualized. No evidence of pulmonic stenosis. Aorta: The aortic root is normal in size and structure. Venous: The inferior vena cava is normal in size with greater than 50% respiratory variability, suggesting right atrial pressure of 3 mmHg. IAS/Shunts: No atrial level shunt detected by color flow Doppler.  LEFT VENTRICLE PLAX 2D LVIDd:         4.70 cm   Diastology LVIDs:         2.80 cm   LV e' medial:    3.81 cm/s LV PW:         1.10 cm   LV E/e' medial:  21.5 LV IVS:         1.10 cm   LV e' lateral:   7.07 cm/s LVOT diam:     2.30 cm   LV E/e' lateral: 11.6 LV SV:         43 LV SV Index:   22 LVOT Area:     4.15 cm  LEFT ATRIUM           Index LA diam:      3.80 cm 1.92 cm/m LA Vol (A4C): 87.1 ml 44.01 ml/m  AORTIC VALVE LVOT Vmax:   57.50 cm/s LVOT Vmean:  37.000 cm/s LVOT VTI:    0.103 m  AORTA Ao Root diam: 2.90 cm Ao Asc diam:  3.30 cm MITRAL VALVE MV Area (PHT): 2.27 cm     SHUNTS MV E velocity: 82.00 cm/s   Systemic VTI:  0.10 m MV A velocity: 132.00 cm/s  Systemic Diam: 2.30 cm MV E/A ratio:  0.62 Toribio Fuel MD Electronically signed by Toribio Fuel MD Signature Date/Time: 07/05/2023/9:53:58 AM    Final    CT CHEST ABDOMEN PELVIS WO CONTRAST Result Date: 07/04/2023 CLINICAL DATA:  Syncope and fall. EXAM: CT CHEST, ABDOMEN AND PELVIS WITHOUT CONTRAST TECHNIQUE: Multidetector CT imaging of the chest, abdomen and pelvis was performed following the standard protocol without IV contrast. RADIATION DOSE REDUCTION: This exam was performed according to the departmental dose-optimization program which includes automated exposure control, adjustment of the mA and/or kV according to patient size and/or use of iterative reconstruction technique. COMPARISON:  February 24, 2014. FINDINGS: CT CHEST FINDINGS Cardiovascular: Status post coronary artery bypass graft. Atherosclerosis of thoracic aorta without aneurysm. Normal cardiac size. No pericardial effusion. Mediastinum/Nodes: Moderate size hiatal hernia. No adenopathy. Thyroid  gland is unremarkable. Lungs/Pleura: Lungs are clear. No pleural effusion or pneumothorax. Musculoskeletal: No chest wall mass or suspicious bone lesions identified. CT ABDOMEN PELVIS FINDINGS Hepatobiliary: No focal liver abnormality is seen. Status post cholecystectomy. No biliary dilatation. Pancreas: Unremarkable. No pancreatic ductal dilatation or surrounding inflammatory changes. Spleen: Calcified splenic granulomas are noted. Adrenals/Urinary  Tract: Adrenal glands are unremarkable. Kidneys are normal, without renal calculi, focal lesion, or hydronephrosis. Bladder is unremarkable. Stomach/Bowel: There is no evidence of bowel obstruction or inflammation. Sigmoid diverticulosis is noted without inflammation. Moderate sized epigastric ventral hernia is noted which contains a portion of the gastric body, but does not result in obstruction. Status post appendectomy. Vascular/Lymphatic: Aortic atherosclerosis. No enlarged abdominal or pelvic lymph nodes. Reproductive: Status post hysterectomy. No adnexal masses. Other: No abdominal wall hernia or abnormality. No abdominopelvic ascites. Musculoskeletal: No acute or significant osseous findings. IMPRESSION: No definite traumatic injury seen in the chest, abdomen or pelvis. Moderate size sliding-type hiatal hernia. Moderate size epigastric ventral hernia which contains a portion of the stomach, but does not result in obstruction. Sigmoid diverticulosis without inflammation. Status post coronary artery bypass graft. Aortic Atherosclerosis (ICD10-I70.0). Electronically Signed   By: Lynwood Landy Raddle M.D.   On: 07/04/2023 16:02   CT T-SPINE NO CHARGE Result Date: 07/04/2023 CLINICAL DATA:  Syncope and fall EXAM: CT THORACIC SPINE WITHOUT CONTRAST TECHNIQUE: Multidetector CT images of the thoracic were obtained using the standard protocol without intravenous contrast. RADIATION DOSE REDUCTION: This exam was performed according to the departmental dose-optimization program which includes automated exposure control, adjustment of the mA and/or kV according to patient size and/or use of iterative reconstruction technique. COMPARISON:  No prior CT of the thoracic spine available, correlation is made with 02/24/2014 CT chest FINDINGS: Alignment: Mild dextrocurvature of the midthoracic spine and levocurvature of the thoracolumbar junction. Exaggeration of the normal thoracic kyphosis. No listhesis. Vertebrae: No acute  fracture or focal pathologic process. Paraspinal and other soft tissues: Please see same-day CT chest abdomen pelvis. Disc levels: Mild degenerative changes with some disc height loss but no significant spinal canal stenosis. IMPRESSION: 1. No acute fracture or traumatic listhesis. 2. Mild degenerative changes with some disc height loss but no significant spinal canal stenosis. Electronically Signed   By: Donald Campion M.D.   On: 07/04/2023 16:01   CT HEAD WO CONTRAST ( ) Result Date: 07/04/2023 CLINICAL DATA:  Syncope and fall, hit her head, on blood thinners EXAM: CT HEAD WITHOUT CONTRAST CT CERVICAL SPINE WITHOUT CONTRAST TECHNIQUE: Multidetector CT imaging of the head and cervical spine was performed following the standard protocol without intravenous contrast. Multiplanar CT image reconstructions of the cervical spine were also generated. RADIATION DOSE REDUCTION: This exam was performed according to the departmental dose-optimization program which includes automated exposure control, adjustment of the mA and/or kV according to patient size and/or use of iterative reconstruction technique. COMPARISON:  08/19/2014 CT head, no prior CT cervical spine available FINDINGS: CT HEAD FINDINGS Brain: No evidence of acute infarct, hemorrhage, mass, mass effect, or midline shift. No hydrocephalus or extra-axial fluid collection. 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 Cerebral volume is within normal limits for age. Basal ganglia calcifications. Vascular: No hyperdense vessel. Skull: Negative for fracture or focal lesion. Sinuses/Orbits: Chronic bilateral maxillary sinusitis, with near complete opacification of the left maxillary sinus. Minimal mucosal thickening in the anterior ethmoid air cells. Status post bilateral lens replacements. Other: The mastoid air cells are well aerated. CT CERVICAL SPINE FINDINGS Alignment: No traumatic listhesis. Skull base and vertebrae: No acute fracture or  suspicious osseous lesion. Soft tissues and spinal canal: No prevertebral fluid or swelling. No visible canal hematoma. Disc levels: Degenerative changes in the cervical spine.No high-grade spinal canal stenosis. Upper chest: For findings in the thorax, please see same day CT chest. IMPRESSION: 1. No  acute intracranial process. 2. No acute fracture or traumatic listhesis in the cervical spine. Electronically Signed   By: Donald Campion M.D.   On: 07/04/2023 15:59   CT CERVICAL SPINE WO CONTRAST Result Date: 07/04/2023 CLINICAL DATA:  Syncope and fall, hit her head, on blood thinners EXAM: CT HEAD WITHOUT CONTRAST CT CERVICAL SPINE WITHOUT CONTRAST TECHNIQUE: Multidetector CT imaging of the head and cervical spine was performed following the standard protocol without intravenous contrast. Multiplanar CT image reconstructions of the cervical spine were also generated. RADIATION DOSE REDUCTION: This exam was performed according to the departmental dose-optimization program which includes automated exposure control, adjustment of the mA and/or kV according to patient size and/or use of iterative reconstruction technique. COMPARISON:  08/19/2014 CT head, no prior CT cervical spine available FINDINGS: CT HEAD FINDINGS Brain: No evidence of acute infarct, hemorrhage, mass, mass effect, or midline shift. No hydrocephalus or extra-axial fluid collection. 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 Cerebral volume is within normal limits for age. Basal ganglia calcifications. Vascular: No hyperdense vessel. Skull: Negative for fracture or focal lesion. Sinuses/Orbits: Chronic bilateral maxillary sinusitis, with near complete opacification of the left maxillary sinus. Minimal mucosal thickening in the anterior ethmoid air cells. Status post bilateral lens replacements. Other: The mastoid air cells are well aerated. CT CERVICAL SPINE FINDINGS Alignment: No traumatic listhesis. Skull base and vertebrae: No  acute fracture or suspicious osseous lesion. Soft tissues and spinal canal: No prevertebral fluid or swelling. No visible canal hematoma. Disc levels: Degenerative changes in the cervical spine.No high-grade spinal canal stenosis. Upper chest: For findings in the thorax, please see same day CT chest. IMPRESSION: 1. No acute intracranial process. 2. No acute fracture or traumatic listhesis in the cervical spine. Electronically Signed   By: Donald Campion M.D.   On: 07/04/2023 15:59   DG Pelvis Portable Result Date: 07/04/2023 CLINICAL DATA:  Trauma.  Pelvic pain. EXAM: PORTABLE PELVIS 1-2 VIEWS COMPARISON:  None Available. FINDINGS: Pelvis is intact with normal and symmetric sacroiliac joints. No acute fracture or dislocation. No aggressive osseous lesion. Visualized sacral arcuate lines are unremarkable. Unremarkable symphysis pubis. There are mild degenerative changes of bilateral hip joints without characterized by mild joint space narrowing and osteophytosis of the superior acetabulum. No radiopaque foreign bodies. IMPRESSION: No acute osseous abnormality of the pelvis. Electronically Signed   By: Ree Molt M.D.   On: 07/04/2023 15:23   DG CHEST PORT 1 VIEW Result Date: 07/04/2023 CLINICAL DATA:  Low back pain.  Trauma.  Fall. EXAM: PORTABLE CHEST 1 VIEW COMPARISON:  04/24/2017. FINDINGS: Bilateral lung fields are clear. Bilateral costophrenic angles are clear. Normal cardio-mediastinal silhouette. There are surgical staples along the heart border and sternotomy wires, status post CABG (coronary artery bypass graft). No acute osseous abnormalities. The soft tissues are within normal limits. IMPRESSION: No active disease. Electronically Signed   By: Ree Molt M.D.   On: 07/04/2023 15:23    Scheduled Meds:    calcium  carbonate  625 mg Oral Q breakfast   carvedilol   6.25 mg Oral BID WC   clopidogrel   75 mg Oral Daily   ezetimibe   10 mg Oral Daily   gabapentin   300 mg Oral BID   heparin   5,000  Units Subcutaneous Q8H   insulin  aspart  0-15 Units Subcutaneous TID PC & HS   isosorbide  mononitrate  15 mg Oral Daily   levothyroxine   50 mcg Oral Q0600   pantoprazole   40 mg Oral  Daily   ranolazine   500 mg Oral BID   sodium chloride  flush  3 mL Intravenous Q12H    Continuous Infusions:    lactated ringers  75 mL/hr at 07/05/23 1202     LOS: 0 days     Trenda Mar, MD,  FACP, [facility], [facility], [facility]   [facility] [place]      To contact the attending provider between 7A-7P or the covering provider during after hours 7P-7A, please log into the web site [email or web address] and access using universal [place] password for that web site. If you do not have the password, please call the hospital operator.  07/05/2023, 3:52 PM

## 2023-07-06 DIAGNOSIS — N179 Acute kidney failure, unspecified: Secondary | ICD-10-CM | POA: Diagnosis not present

## 2023-07-06 DIAGNOSIS — I951 Orthostatic hypotension: Secondary | ICD-10-CM | POA: Diagnosis not present

## 2023-07-06 LAB — BASIC METABOLIC PANEL
Anion gap: 11 (ref 5–15)
BUN: 49 mg/dL — ABNORMAL HIGH (ref 8–23)
CO2: 27 mmol/L (ref 22–32)
Calcium: 9.6 mg/dL (ref 8.9–10.3)
Chloride: 99 mmol/L (ref 98–111)
Creatinine, Ser: 2.19 mg/dL — ABNORMAL HIGH (ref 0.44–1.00)
GFR, Estimated: 22 mL/min — ABNORMAL LOW (ref >60)
Glucose, Bld: 148 mg/dL — ABNORMAL HIGH (ref 70–99)
Potassium: 4.7 mmol/L (ref 3.5–5.1)
Sodium: 137 mmol/L (ref 135–145)

## 2023-07-06 LAB — GLUCOSE, CAPILLARY
Glucose-Capillary: 168 mg/dL — ABNORMAL HIGH (ref 70–99)
Glucose-Capillary: 239 mg/dL — ABNORMAL HIGH (ref 70–99)
Glucose-Capillary: 167 mg/dL — ABNORMAL HIGH (ref 70–99)
Glucose-Capillary: 184 mg/dL — ABNORMAL HIGH (ref 70–99)

## 2023-07-06 MED ORDER — INSULIN ASPART 100 UNIT/ML IJ SOLN
0.0000 [IU] | Freq: Three times a day (TID) | INTRAMUSCULAR | Status: DC
  Administered 2023-07-06: 2 [IU] via SUBCUTANEOUS
  Administered 2023-07-07: 3 [IU] via SUBCUTANEOUS
  Administered 2023-07-07 (×2): 2 [IU] via SUBCUTANEOUS
  Administered 2023-07-08: 5 [IU] via SUBCUTANEOUS
  Administered 2023-07-08: 1 [IU] via SUBCUTANEOUS

## 2023-07-06 MED ORDER — INSULIN ASPART 100 UNIT/ML IJ SOLN: 0.0000 [IU] | Freq: Every day | INTRAMUSCULAR | Status: DC

## 2023-07-06 MED ORDER — LIDOCAINE 5 % EX PTCH
1.0000 | MEDICATED_PATCH | CUTANEOUS | Status: DC
  Administered 2023-07-06 – 2023-07-08 (×3): 1 via TRANSDERMAL
  Filled 2023-07-06 (×4): qty 1

## 2023-07-06 MED ORDER — MIDODRINE HCL 5 MG PO TABS
5.0000 mg | ORAL_TABLET | Freq: Two times a day (BID) | ORAL | Status: DC
  Administered 2023-07-06 – 2023-07-07 (×3): 5 mg via ORAL
  Filled 2023-07-06 (×4): qty 1

## 2023-07-06 MED ORDER — LACTATED RINGERS IV SOLN: INTRAVENOUS | Status: AC

## 2023-07-06 NOTE — Plan of Care (Signed)

## 2023-07-06 NOTE — Progress Notes (Signed)
 PROGRESS NOTE   Carol [name]  FMW:[id number]    DOB: 03-Mar-1936    DOA: 07/04/2023  PCP: Sherre Clapper, MD   I have briefly reviewed patients previous medical records in [facility].  Chief Complaint  Patient presents with   Loss of Consciousness    Brief Hospital Course:  88 y.o. female with medical history significant of CAD s/p stent and CABG, HFrEF, HTN, T2DM, asthma, hypothyroidism, CKD4, hard of hearing who presents with syncope and fall.  Admitted for syncope, fall, orthostatic hypotension and acute on chronic kidney disease, suspected due to poor oral intake for several days PTA.  Ongoing orthostatic hypotension despite IVF hydration,?  Autonomic dysfunction.  Initiated midodrine .  Patient declining [facility] SN[facility] for rehab and insists on returning home with help of friends to assist her.   Assessment & Plan:  Principal Problem:   Syncope Active Problems:   Acquired hypothyroidism   Hx of CABG   AKI (acute kidney injury) (HCC)   Type 2 diabetes mellitus with chronic kidney disease, with long-term current use of insulin  (HCC)   Syncope and fall/collapse Suspected due to orthostatic hypotension from dehydration due to poor oral intake for several days PTA.  Unclear if orthostatic hypotension is also due to autonomic dysfunction. Clearly orthostatic on 1/10 and 1/11 Telemetry shows sinus rhythm with first-degree AV block and no arrhythmias.  Discontinued telemetry. 2D echo: LVEF 60-65% grade 1 diastolic dysfunction.  No aortic stenosis. HS Troponin negative. Treat orthostatic hypotension as below. PT recommends SNF, TOC consulted.  However today patient insists that she does not want to go to [facility] SNF for rehab and insists on discharging home and says that she has several friends that we will come and assist her throughout the daytime and she does not want anybody at night. Driving restrictions to be counseled at time of discharge. Extensive imaging (CT C/A/P, CT C and L-spine,  CT head, x-ray pelvis) without acute findings.  Orthostatic hypotension/dehydration Due to several days history of nausea, dry heaves, poor oral intake due to feeling sick and possibly due to orthostatic hypotension IV fluids, bilateral TED hose and monitor Remains significantly orthostatic, BP down from 145/50 standing to 83/49 and symptomatic of dizziness. Volume status may be close to euvolemic but will continue additional 24 hours of IV fluids and will add midodrine  5 Mg twice daily.  Acute kidney injury complicating stage IV CKD Most recent baseline creatinine of 1.78 on 12/27. Presented with creatinine of 2.25. AKI secondary to dehydration. Renal ultrasound: Bilateral mild renal atrophy. No collecting system dilatation. Poor visualization of the urinary bladder Brief IV fluids, creatinine about the same as yesterday, continue IV fluids for additional 24 hours and check BMP in AM.  Type 2 diabetes mellitus with chronic kidney disease, with long-term current use of insulin  (HCC) -Last A1c 7.9 on 12/27 suggesting poor outpatient control. -Moderate SSI.  Labile.  Continue current regimen without change.  Essential hypertension: Controlled on carvedilol , Imdur .   Hx of CABG - Patient complaining of posterior neck pain which feels similar to her prior MI.  However, her pain was improved once her neck was adjusted with additional pillows suggesting more MSK in origin.  Her troponin and EKG also reassuring at this time. -Low suspicion of cardiogenic cause of her syncope.  -continue Plavix , Imdur , carvedilol  and Ranexa  -No further neck pain or angina.   Acquired hypothyroidism - Continue levothyroxine .  TSH 2.750 in August 2024  Asymptomatic bacteriuria Denies UTI symptoms.  Afebrile.  Transient leukocytosis improved without antibiotics.  >100 K colonies of E. coli on urine culture likely colonization.  Hold off antibiotics unless clinical UTI.  Low back pain Noted after  fall. Imaging as noted above without acute findings Added lidocaine  patch, change acetaminophen  1 g 3 times daily while here.   Body mass index is 28.95 kg/m.   DVT prophylaxis: Place TED hose Start: 07/05/23 1146 heparin  injection 5,000 Units Start: 07/04/23 2200     Code Status: Limited: Do not attempt resuscitation (DNR) -DNR-LIMITED -Do Not Intubate/DNI :  Family Communication: son via phone. Disposition:  Status is: Inpatient Remains inpatient appropriate because: AKI, ALT static hypotension and need for IV fluids     Consultants:     Procedures:     Antimicrobials:      Subjective:  States that she has been up and about this morning with walker to the bathroom etc. without dizziness.  However per nursing when orthostatics were done, she was positive for orthostatics and dizzy.  Reports low back pain without radiation to legs, no leg weakness, tingling or numbness or sphincter disturbances.  Denies dysuria, urinary frequency, urgency, fever or chills.  No flank pain.  States that from now on she will be drinking more water  at home.  Drinks bottled water .  Objective:   Vitals:   07/06/23 0424 07/06/23 0851 07/06/23 1148 07/06/23 1217  BP: (!) 117/46 (!) 105/51 (!) 151/58 (!) 140/52  Pulse: 62 (!) 57 65   Resp: 18     Temp: 98.2 F (36.8 C) 98.1 F (36.7 C) 98.3 F (36.8 C) 98 F (36.7 C)  TempSrc: Oral Oral Oral Oral  SpO2: 93% 98% 93% 92%  Weight:      Height:        General exam: Elderly female, moderately built and frail lying comfortably propped up in bed without distress.  Oral mucosa borderline hydration.  Extremely hard of hearing.  Does not have her hearing aid. Respiratory system: Clear to auscultation.  No creased work of breathing Cardiovascular system: S1 & S2 heard, RRR. No JVD, murmurs, rubs, gallops or clicks. No pedal edema.  Telemetry personally reviewed: Sinus rhythm. Gastrointestinal system: Abdomen is nondistended, soft and nontender. No  organomegaly or masses felt. Normal bowel sounds heard. Central nervous system: Alert and oriented. No focal neurological deficits. Extremities: Symmetric 5 x 5 power.  Some bruising of her upper extremities. Skin: No rashes, lesions or ulcers Psychiatry: Judgement and insight appear normal. Mood & affect appropriate. Musculoskeletal: No deformity or tenderness across lower back.     Data Reviewed:   I have personally reviewed following labs and imaging studies   CBC: Recent Labs  Lab 07/04/23 1503 07/05/23 0730  WBC 17.4* 11.3*  NEUTROABS 15.0*  --   HGB 13.5 12.4  HCT 41.3 37.4  MCV 96.3 96.1  PLT 265 220    Basic Metabolic Panel: Recent Labs  Lab 07/04/23 1503 07/05/23 0730 07/06/23 0604  NA 138 137 137  K 4.6 4.6 4.7  CL 97* 96* 99  CO2 27 26 27   GLUCOSE 146* 230* 148*  BUN 51* 51* 49*  CREATININE 2.25* 2.21* 2.19*  CALCIUM  10.0 9.4 9.6    Liver Function Tests: Recent Labs  Lab 07/04/23 1503  AST 28  ALT 18  ALKPHOS 126  BILITOT 0.9  PROT 7.3  ALBUMIN  4.0    CBG: Recent Labs  Lab 07/05/23 2113 07/06/23 0648 07/06/23 1151  GLUCAP 153* 168* 239*    Microbiology Studies:  Recent Results (from the past 240 hours)  Resp panel by RT-PCR (RSV, Flu A&B, Covid) Urine, Clean Catch     Status: None   Collection Time: 07/04/23  8:43 PM   Specimen: Urine, Clean Catch; Nasal Swab  Result Value Ref Range Status   SARS Coronavirus 2 by RT PCR NEGATIVE NEGATIVE Final   Influenza A by PCR NEGATIVE NEGATIVE Final   Influenza B by PCR NEGATIVE NEGATIVE Final    Comment: (NOTE) The Xpert Xpress SARS-CoV-2/FLU/RSV plus assay is intended as an aid in the diagnosis of influenza from Nasopharyngeal swab specimens and should not be used as a sole basis for treatment. Nasal washings and aspirates are unacceptable for Xpert Xpress SARS-CoV-2/FLU/RSV testing.  Fact Sheet for Patients: bloggercourse.com  Fact Sheet for Healthcare  Providers: seriousbroker.it  This test is not yet approved or cleared by the [place]  FDA and has been authorized for detection and/or diagnosis of SARS-CoV-2 by FDA under an Emergency Use Authorization (EUA). This EUA will remain in effect (meaning this test can be used) for the duration of the COVID-19 declaration under Section 564(b)(1) of the Act, 21 U.S.C. section 360bbb-3(b)(1), unless the authorization is terminated or revoked.     Resp Syncytial Virus by PCR NEGATIVE NEGATIVE Final    Comment: (NOTE) Fact Sheet for Patients: bloggercourse.com  Fact Sheet for Healthcare Providers: seriousbroker.it  This test is not yet approved or cleared by the [place]  FDA and has been authorized for detection and/or diagnosis of SARS-CoV-2 by FDA under an Emergency Use Authorization (EUA). This EUA will remain in effect (meaning this test can be used) for the duration of the COVID-19 declaration under Section 564(b)(1) of the Act, 21 U.S.C. section 360bbb-3(b)(1), unless the authorization is terminated or revoked.  Performed at [facility], [address] [place], [place] [address]   Urine Culture     Status: Abnormal (Preliminary result)   Collection Time: 07/04/23  8:43 PM   Specimen: Urine, Random  Result Value Ref Range Status   Specimen Description URINE, RANDOM  Final   Special Requests NONE Reflexed from [id number]  Final   Culture (A)  Final    >=100,000 COLONIES/mL ESCHERICHIA COLI CULTURE REINCUBATED FOR BETTER GROWTH Performed at [facility], [address] [place], [place] [address]    Report Status PENDING  Incomplete   Organism ID, Bacteria ESCHERICHIA COLI (A)  Final      Susceptibility   Escherichia coli - MIC*    AMPICILLIN >=32 RESISTANT Resistant     CEFAZOLIN  <=4 SENSITIVE Sensitive     CEFEPIME <=0.12 SENSITIVE Sensitive     CEFTRIAXONE  <=0.25 SENSITIVE  Sensitive     CIPROFLOXACIN  <=0.25 SENSITIVE Sensitive     GENTAMICIN <=1 SENSITIVE Sensitive     IMIPENEM <=0.25 SENSITIVE Sensitive     NITROFURANTOIN <=16 SENSITIVE Sensitive     TRIMETH /SULFA  >=320 RESISTANT Resistant     AMPICILLIN/SULBACTAM >=32 RESISTANT Resistant     PIP/TAZO 64 INTERMEDIATE Intermediate ug/mL    * >=100,000 COLONIES/mL ESCHERICHIA COLI    Radiology Studies:  US  RENAL Result Date: 07/05/2023 CLINICAL DATA:  Acute kidney injury EXAM: RENAL / URINARY TRACT ULTRASOUND COMPLETE COMPARISON:  Noncontrast CT 07/04/2023. FINDINGS: Right Kidney: Renal measurements: 9.4 x 4.3 x 4.0 cm = volume: 82.6 mL. Mild parenchymal atrophy. No collecting system dilatation or perinephric fluid. Left Kidney: Renal measurements: 8.4 x 3.1 x 5.2 cm = volume: 69.8 mL. Global parenchymal atrophy. No collecting system dilatation or perinephric  fluid. Bladder: Poorly seen.  This could be collapsed. Other: None IMPRESSION: Bilateral mild renal atrophy. No collecting system dilatation. Poor visualization of the urinary bladder Electronically Signed   By: Ranell Bring M.D.   On: 07/05/2023 13:10   ECHOCARDIOGRAM COMPLETE Result Date: 07/05/2023    ECHOCARDIOGRAM REPORT   Patient Name:   LUNETTA MARINA Date of Exam: 07/05/2023 Medical Rec #:  [id number]        Height:       67.5 in Accession #:    [id number]       Weight:       187.6 lb Date of Birth:  [birth year]        BSA:          1.979 m Patient Age:    87 years         BP:           112/82 mmHg Patient Gender: F                HR:           70 bpm. Exam Location:  Inpatient Procedure: 2D Echo, Color Doppler, Cardiac Doppler and Intracardiac            Opacification Agent Indications:    Stroke I63.9  History:        Patient has prior history of Echocardiogram examinations, most                 recent 01/16/2022.  Sonographer:    Tinnie Gosling RDCS Referring Phys: [phone] Mckaylie Vasey D Courteny Egler IMPRESSIONS  1. Left ventricular ejection fraction, by estimation, is  60 to 65%. The left ventricle has normal function. The left ventricle has no regional wall motion abnormalities. There is mild concentric left ventricular hypertrophy. Left ventricular diastolic parameters are consistent with Grade I diastolic dysfunction (impaired relaxation).  2. Right ventricular systolic function is normal. The right ventricular size is not well visualized.  3. Left atrial size was moderately dilated.  4. The mitral valve is normal in structure. Trivial mitral valve regurgitation. No evidence of mitral stenosis.  5. The aortic valve is tricuspid. There is mild calcification of the aortic valve. Aortic valve regurgitation is trivial. Aortic valve sclerosis/calcification is present, without any evidence of aortic stenosis.  6. The inferior vena cava is normal in size with greater than 50% respiratory variability, suggesting right atrial pressure of 3 mmHg. FINDINGS  Left Ventricle: Left ventricular ejection fraction, by estimation, is 60 to 65%. The left ventricle has normal function. The left ventricle has no regional wall motion abnormalities. The left ventricular internal cavity size was normal in size. There is  mild concentric left ventricular hypertrophy. Left ventricular diastolic parameters are consistent with Grade I diastolic dysfunction (impaired relaxation). Right Ventricle: The right ventricular size is not well visualized. No increase in right ventricular wall thickness. Right ventricular systolic function is normal. Left Atrium: Left atrial size was moderately dilated. Right Atrium: Right atrial size was normal in size. Pericardium: There is no evidence of pericardial effusion. Mitral Valve: The mitral valve is normal in structure. Trivial mitral valve regurgitation. No evidence of mitral valve stenosis. Tricuspid Valve: The tricuspid valve is normal in structure. Tricuspid valve regurgitation is trivial. No evidence of tricuspid stenosis. Aortic Valve: The aortic valve is  tricuspid. There is mild calcification of the aortic valve. Aortic valve regurgitation is trivial. Aortic valve sclerosis/calcification is present, without any evidence of aortic stenosis. Pulmonic Valve: The pulmonic valve was  normal in structure. Pulmonic valve regurgitation is not visualized. No evidence of pulmonic stenosis. Aorta: The aortic root is normal in size and structure. Venous: The inferior vena cava is normal in size with greater than 50% respiratory variability, suggesting right atrial pressure of 3 mmHg. IAS/Shunts: No atrial level shunt detected by color flow Doppler.  LEFT VENTRICLE PLAX 2D LVIDd:         4.70 cm   Diastology LVIDs:         2.80 cm   LV e' medial:    3.81 cm/s LV PW:         1.10 cm   LV E/e' medial:  21.5 LV IVS:        1.10 cm   LV e' lateral:   7.07 cm/s LVOT diam:     2.30 cm   LV E/e' lateral: 11.6 LV SV:         43 LV SV Index:   22 LVOT Area:     4.15 cm  LEFT ATRIUM           Index LA diam:      3.80 cm 1.92 cm/m LA Vol (A4C): 87.1 ml 44.01 ml/m  AORTIC VALVE LVOT Vmax:   57.50 cm/s LVOT Vmean:  37.000 cm/s LVOT VTI:    0.103 m  AORTA Ao Root diam: 2.90 cm Ao Asc diam:  3.30 cm MITRAL VALVE MV Area (PHT): 2.27 cm     SHUNTS MV E velocity: 82.00 cm/s   Systemic VTI:  0.10 m MV A velocity: 132.00 cm/s  Systemic Diam: 2.30 cm MV E/A ratio:  0.62 Toribio Fuel MD Electronically signed by Toribio Fuel MD Signature Date/Time: 07/05/2023/9:53:58 AM    Final    CT CHEST ABDOMEN PELVIS WO CONTRAST Result Date: 07/04/2023 CLINICAL DATA:  Syncope and fall. EXAM: CT CHEST, ABDOMEN AND PELVIS WITHOUT CONTRAST TECHNIQUE: Multidetector CT imaging of the chest, abdomen and pelvis was performed following the standard protocol without IV contrast. RADIATION DOSE REDUCTION: This exam was performed according to the departmental dose-optimization program which includes automated exposure control, adjustment of the mA and/or kV according to patient size and/or use of iterative  reconstruction technique. COMPARISON:  February 24, 2014. FINDINGS: CT CHEST FINDINGS Cardiovascular: Status post coronary artery bypass graft. Atherosclerosis of thoracic aorta without aneurysm. Normal cardiac size. No pericardial effusion. Mediastinum/Nodes: Moderate size hiatal hernia. No adenopathy. Thyroid  gland is unremarkable. Lungs/Pleura: Lungs are clear. No pleural effusion or pneumothorax. Musculoskeletal: No chest wall mass or suspicious bone lesions identified. CT ABDOMEN PELVIS FINDINGS Hepatobiliary: No focal liver abnormality is seen. Status post cholecystectomy. No biliary dilatation. Pancreas: Unremarkable. No pancreatic ductal dilatation or surrounding inflammatory changes. Spleen: Calcified splenic granulomas are noted. Adrenals/Urinary Tract: Adrenal glands are unremarkable. Kidneys are normal, without renal calculi, focal lesion, or hydronephrosis. Bladder is unremarkable. Stomach/Bowel: There is no evidence of bowel obstruction or inflammation. Sigmoid diverticulosis is noted without inflammation. Moderate sized epigastric ventral hernia is noted which contains a portion of the gastric body, but does not result in obstruction. Status post appendectomy. Vascular/Lymphatic: Aortic atherosclerosis. No enlarged abdominal or pelvic lymph nodes. Reproductive: Status post hysterectomy. No adnexal masses. Other: No abdominal wall hernia or abnormality. No abdominopelvic ascites. Musculoskeletal: No acute or significant osseous findings. IMPRESSION: No definite traumatic injury seen in the chest, abdomen or pelvis. Moderate size sliding-type hiatal hernia. Moderate size epigastric ventral hernia which contains a portion of the stomach, but does not result in obstruction. Sigmoid diverticulosis without inflammation. Status  post coronary artery bypass graft. Aortic Atherosclerosis (ICD10-I70.0). Electronically Signed   By: Lynwood Landy Raddle M.D.   On: 07/04/2023 16:02   CT T-SPINE NO CHARGE Result Date:  07/04/2023 CLINICAL DATA:  Syncope and fall EXAM: CT THORACIC SPINE WITHOUT CONTRAST TECHNIQUE: Multidetector CT images of the thoracic were obtained using the standard protocol without intravenous contrast. RADIATION DOSE REDUCTION: This exam was performed according to the departmental dose-optimization program which includes automated exposure control, adjustment of the mA and/or kV according to patient size and/or use of iterative reconstruction technique. COMPARISON:  No prior CT of the thoracic spine available, correlation is made with 02/24/2014 CT chest FINDINGS: Alignment: Mild dextrocurvature of the midthoracic spine and levocurvature of the thoracolumbar junction. Exaggeration of the normal thoracic kyphosis. No listhesis. Vertebrae: No acute fracture or focal pathologic process. Paraspinal and other soft tissues: Please see same-day CT chest abdomen pelvis. Disc levels: Mild degenerative changes with some disc height loss but no significant spinal canal stenosis. IMPRESSION: 1. No acute fracture or traumatic listhesis. 2. Mild degenerative changes with some disc height loss but no significant spinal canal stenosis. Electronically Signed   By: Donald Campion M.D.   On: 07/04/2023 16:01   CT HEAD WO CONTRAST ( ) Result Date: 07/04/2023 CLINICAL DATA:  Syncope and fall, hit her head, on blood thinners EXAM: CT HEAD WITHOUT CONTRAST CT CERVICAL SPINE WITHOUT CONTRAST TECHNIQUE: Multidetector CT imaging of the head and cervical spine was performed following the standard protocol without intravenous contrast. Multiplanar CT image reconstructions of the cervical spine were also generated. RADIATION DOSE REDUCTION: This exam was performed according to the departmental dose-optimization program which includes automated exposure control, adjustment of the mA and/or kV according to patient size and/or use of iterative reconstruction technique. COMPARISON:  08/19/2014 CT head, no prior CT cervical spine available  FINDINGS: CT HEAD FINDINGS Brain: No evidence of acute infarct, hemorrhage, mass, mass effect, or midline shift. No hydrocephalus or extra-axial fluid collection. 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 Cerebral volume is within normal limits for age. Basal ganglia calcifications. Vascular: No hyperdense vessel. Skull: Negative for fracture or focal lesion. Sinuses/Orbits: Chronic bilateral maxillary sinusitis, with near complete opacification of the left maxillary sinus. Minimal mucosal thickening in the anterior ethmoid air cells. Status post bilateral lens replacements. Other: The mastoid air cells are well aerated. CT CERVICAL SPINE FINDINGS Alignment: No traumatic listhesis. Skull base and vertebrae: No acute fracture or suspicious osseous lesion. Soft tissues and spinal canal: No prevertebral fluid or swelling. No visible canal hematoma. Disc levels: Degenerative changes in the cervical spine.No high-grade spinal canal stenosis. Upper chest: For findings in the thorax, please see same day CT chest. IMPRESSION: 1. No acute intracranial process. 2. No acute fracture or traumatic listhesis in the cervical spine. Electronically Signed   By: Donald Campion M.D.   On: 07/04/2023 15:59   CT CERVICAL SPINE WO CONTRAST Result Date: 07/04/2023 CLINICAL DATA:  Syncope and fall, hit her head, on blood thinners EXAM: CT HEAD WITHOUT CONTRAST CT CERVICAL SPINE WITHOUT CONTRAST TECHNIQUE: Multidetector CT imaging of the head and cervical spine was performed following the standard protocol without intravenous contrast. Multiplanar CT image reconstructions of the cervical spine were also generated. RADIATION DOSE REDUCTION: This exam was performed according to the departmental dose-optimization program which includes automated exposure control, adjustment of the mA and/or kV according to patient size and/or use of iterative reconstruction technique. COMPARISON:  08/19/2014 CT head, no prior CT  cervical spine available FINDINGS: CT HEAD FINDINGS Brain: No evidence of acute infarct, hemorrhage, mass, mass effect, or midline shift. No hydrocephalus or extra-axial fluid collection. 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 Cerebral volume is within normal limits for age. Basal ganglia calcifications. Vascular: No hyperdense vessel. Skull: Negative for fracture or focal lesion. Sinuses/Orbits: Chronic bilateral maxillary sinusitis, with near complete opacification of the left maxillary sinus. Minimal mucosal thickening in the anterior ethmoid air cells. Status post bilateral lens replacements. Other: The mastoid air cells are well aerated. CT CERVICAL SPINE FINDINGS Alignment: No traumatic listhesis. Skull base and vertebrae: No acute fracture or suspicious osseous lesion. Soft tissues and spinal canal: No prevertebral fluid or swelling. No visible canal hematoma. Disc levels: Degenerative changes in the cervical spine.No high-grade spinal canal stenosis. Upper chest: For findings in the thorax, please see same day CT chest. IMPRESSION: 1. No acute intracranial process. 2. No acute fracture or traumatic listhesis in the cervical spine. Electronically Signed   By: Donald Campion M.D.   On: 07/04/2023 15:59   DG Pelvis Portable Result Date: 07/04/2023 CLINICAL DATA:  Trauma.  Pelvic pain. EXAM: PORTABLE PELVIS 1-2 VIEWS COMPARISON:  None Available. FINDINGS: Pelvis is intact with normal and symmetric sacroiliac joints. No acute fracture or dislocation. No aggressive osseous lesion. Visualized sacral arcuate lines are unremarkable. Unremarkable symphysis pubis. There are mild degenerative changes of bilateral hip joints without characterized by mild joint space narrowing and osteophytosis of the superior acetabulum. No radiopaque foreign bodies. IMPRESSION: No acute osseous abnormality of the pelvis. Electronically Signed   By: Ree Molt M.D.   On: 07/04/2023 15:23   DG CHEST PORT 1  VIEW Result Date: 07/04/2023 CLINICAL DATA:  Low back pain.  Trauma.  Fall. EXAM: PORTABLE CHEST 1 VIEW COMPARISON:  04/24/2017. FINDINGS: Bilateral lung fields are clear. Bilateral costophrenic angles are clear. Normal cardio-mediastinal silhouette. There are surgical staples along the heart border and sternotomy wires, status post CABG (coronary artery bypass graft). No acute osseous abnormalities. The soft tissues are within normal limits. IMPRESSION: No active disease. Electronically Signed   By: Ree Molt M.D.   On: 07/04/2023 15:23    Scheduled Meds:    calcium  carbonate  625 mg Oral Q breakfast   carvedilol   6.25 mg Oral BID WC   clopidogrel   75 mg Oral Daily   ezetimibe   10 mg Oral Daily   gabapentin   300 mg Oral BID   heparin   5,000 Units Subcutaneous Q8H   insulin  aspart  0-15 Units Subcutaneous TID PC & HS   isosorbide  mononitrate  15 mg Oral Daily   levothyroxine   50 mcg Oral Q0600   lidocaine   1 patch Transdermal Q24H   midodrine   5 mg Oral BID WC   pantoprazole   40 mg Oral Daily   ranolazine   500 mg Oral BID   sodium chloride  flush  3 mL Intravenous Q12H    Continuous Infusions:    lactated ringers  75 mL/hr at 07/06/23 1250     LOS: 1 day     Trenda Mar, MD,  FACP, [facility], [facility], [facility]   [facility] [place]      To contact the attending provider between 7A-7P or the covering provider during after hours 7P-7A, please log into the web site [email or web address] and access using universal [place] password for that web site. If you do not have the password, please call the hospital operator.  07/06/2023, 1:55 PM

## 2023-07-07 DIAGNOSIS — N179 Acute kidney failure, unspecified: Secondary | ICD-10-CM | POA: Diagnosis not present

## 2023-07-07 DIAGNOSIS — E1122 Type 2 diabetes mellitus with diabetic chronic kidney disease: Secondary | ICD-10-CM | POA: Diagnosis not present

## 2023-07-07 DIAGNOSIS — I951 Orthostatic hypotension: Secondary | ICD-10-CM | POA: Diagnosis not present

## 2023-07-07 LAB — GLUCOSE, CAPILLARY
Glucose-Capillary: 188 mg/dL — ABNORMAL HIGH (ref 70–99)
Glucose-Capillary: 219 mg/dL — ABNORMAL HIGH (ref 70–99)
Glucose-Capillary: 178 mg/dL — ABNORMAL HIGH (ref 70–99)
Glucose-Capillary: 151 mg/dL — ABNORMAL HIGH (ref 70–99)

## 2023-07-07 LAB — BASIC METABOLIC PANEL
Anion gap: 9 (ref 5–15)
BUN: 46 mg/dL — ABNORMAL HIGH (ref 8–23)
CO2: 25 mmol/L (ref 22–32)
Calcium: 9.1 mg/dL (ref 8.9–10.3)
Chloride: 102 mmol/L (ref 98–111)
Creatinine, Ser: 2.05 mg/dL — ABNORMAL HIGH (ref 0.44–1.00)
GFR, Estimated: 23 mL/min — ABNORMAL LOW (ref >60)
Glucose, Bld: 177 mg/dL — ABNORMAL HIGH (ref 70–99)
Potassium: 4.6 mmol/L (ref 3.5–5.1)
Sodium: 136 mmol/L (ref 135–145)

## 2023-07-07 LAB — URINE CULTURE: Culture: >=100000 — AB

## 2023-07-07 MED ORDER — TRAMADOL HCL 50 MG PO TABS: 50.0000 mg | ORAL_TABLET | Freq: Two times a day (BID) | ORAL | Status: DC | PRN

## 2023-07-07 MED ORDER — ACETAMINOPHEN 500 MG PO TABS
1000.0000 mg | ORAL_TABLET | Freq: Three times a day (TID) | ORAL | Status: DC
  Administered 2023-07-07 – 2023-07-08 (×5): 1000 mg via ORAL
  Filled 2023-07-07 (×5): qty 2

## 2023-07-07 NOTE — Progress Notes (Signed)
   07/07/23 0800  Level of Consciousness  Level of Consciousness Alert  Orthostatic Lying   BP- Lying 139/90  Pulse- Lying 103  Orthostatic Sitting  BP- Sitting 126/64  Pulse- Sitting 60  Orthostatic Standing at 0 minutes  BP- Standing at 0 minutes (!) 84/49  Pulse- Standing at 0 minutes 63  Orthostatic Standing at 3 minutes  BP- Standing at 3 minutes 92/52  Pulse- Standing at 3 minutes 63  Oxygen Therapy  O2 Device Room Air  Pain Assessment  Pain Scale 0-10  Pain Score 0  Glasgow Coma Scale  Eye Opening 4  Best Verbal Response (NON-intubated) 5  Best Motor Response 6  Glasgow Coma Scale Score 15  MEWS Score  MEWS Temp 0  MEWS Systolic 0  MEWS Pulse 0  MEWS RR 0  MEWS LOC 0  MEWS Score 0  MEWS Score Color Green

## 2023-07-07 NOTE — NC FL2 (Signed)
 [place]  MEDICAID FL2 LEVEL OF CARE FORM     IDENTIFICATION  Patient Name: Carol [name] Birthdate: April 01, 1936 Sex: female Admission Date (Current Location): 07/04/2023  [facility] and [place] Number:  Producer, Television/film/video and Address:  [facility], [address] [place], [place] [address]      Provider Number: [id number]  Attending Physician Name and Address:  Judeth Trenda BIRCH, MD  Relative Name and Phone Number:       Current Level of Care: Hospital Recommended Level of Care: Skilled Nursing Facility Prior Approval Number:    Date Approved/Denied:   PASRR Number: [id number]  Discharge Plan: SNF    Current Diagnoses: Patient Active Problem List   Diagnosis Date Noted   AKI (acute kidney injury) (HCC) 07/04/2023   Type 2 diabetes mellitus with chronic kidney disease, with long-term current use of insulin  (HCC) 07/04/2023   Malaise 06/22/2023   Encounter for Medicare annual wellness exam 05/06/2023   Acute cystitis without hematuria 02/20/2023   Herpes zoster without complication 12/11/2022   Essential hypertension 10/27/2022   Syncope 08/09/2022   Anemia 08/02/2022   Asthma 08/02/2022   Carotid stenosis 08/02/2022   Contrast media allergy 08/02/2022   Environmental allergies 08/02/2022   HOH (hard of hearing) 08/02/2022   LBBB (left bundle branch block) 08/02/2022   NSTEMI (non-ST elevated myocardial infarction) (HCC) 08/02/2022   QT prolongation 08/02/2022   TIA (transient ischemic attack) 08/02/2022   Sinus bradycardia 07/18/2022   Class 1 obesity due to excess calories with serious comorbidity and body mass index (BMI) of 30.0 to 30.9 in adult 01/14/2022   Dizziness 11/10/2021   Osteoporosis screening 10/03/2021   Myalgia due to statin 10/03/2021   Chronic idiopathic constipation 09/28/2021   S/P TKR (total knee replacement) using cement, left 04/21/2018   Hx of CABG 04/25/2015   GERD (gastroesophageal reflux disease)  04/19/2015   Hypertensive heart and renal disease 04/19/2015   Spinal stenosis, lumbar region, with neurogenic claudication 12/22/2014   Controlled type 2 diabetes mellitus with diabetic neuropathy, with long-term current use of insulin  (HCC) 04/10/2014   Asthma, moderate persistent 03/23/2014   Post-infarction angina (HCC) 02/17/2014   Abnormal nuclear stress test: Intermediate risk 02/17/2014   Presence of drug coated stent in left circumflex coronary artery: OM2 - Xience Alpine DES 2.25 mm x 12, 2.25 mm x 8 mm overlap 02/17/2014   Ischemic cardiomyopathy - with near resolution of LVEF post MI 02/15/2014   Heart failure with improved ejection fraction (HFimpEF) (HCC) 02/15/2014   Chronic kidney disease, stage 4 (severe) (HCC) 02/15/2014   Left-sided chest pain 01/22/2014   Hyperlipidemia 01/22/2014   History of non-ST elevation myocardial infarction (NSTEMI) 01/22/2014   CAD in native artery 01/22/2014   Mixed conductive and sensorineural hearing loss 12/05/2012   Acquired hypothyroidism 12/05/2012   Lower back pain 12/05/2012   Gout, unspecified     Orientation RESPIRATION BLADDER Height & Weight     Self, Time, Situation, Place  Normal Continent Weight: 189 lb 9.5 oz (86 kg) Height:  5' 7.5 (171.5 cm)  BEHAVIORAL SYMPTOMS/MOOD NEUROLOGICAL BOWEL NUTRITION STATUS      Continent    AMBULATORY STATUS COMMUNICATION OF NEEDS Skin   Extensive Assist Verbally Normal                       Personal Care Assistance Level of Assistance  Bathing, Feeding, Dressing Bathing Assistance: Maximum assistance Feeding assistance: Limited assistance Dressing Assistance:  Maximum assistance     Functional Limitations Info  Sight, Hearing, Speech Sight Info: Adequate Hearing Info: Adequate Speech Info: Adequate    SPECIAL CARE FACTORS FREQUENCY  PT (By licensed PT), OT (By licensed OT)                    Contractures Contractures Info: Present    Additional Factors Info   Code Status Code Status Info: DNR             Current Medications (07/07/2023):  This is the current hospital active medication list Current Facility-Administered Medications  Medication Dose Route Frequency Provider Last Rate Last Admin   acetaminophen  (TYLENOL ) tablet 1,000 mg  1,000 mg Oral TID Hongalgi, Anand D, MD   1,000 mg at 07/07/23 1130   calcium  carbonate (OS-CAL - dosed in mg of elemental calcium ) tablet 625 mg  625 mg Oral Q breakfast Tu, Ching T, DO   625 mg at 07/07/23 9188   carvedilol  (COREG ) tablet 6.25 mg  6.25 mg Oral BID WC Tu, Ching T, DO   6.25 mg at 07/07/23 9188   clopidogrel  (PLAVIX ) tablet 75 mg  75 mg Oral Daily Tu, Ching T, DO   75 mg at 07/07/23 9188   ezetimibe  (ZETIA ) tablet 10 mg  10 mg Oral Daily Tu, Ching T, DO   10 mg at 07/07/23 9188   gabapentin  (NEURONTIN ) capsule 300 mg  300 mg Oral BID Tu, Ching T, DO   300 mg at 07/07/23 9188   heparin  injection 5,000 Units  5,000 Units Subcutaneous Q8H Tu, Ching T, DO   5,000 Units at 07/07/23 1339   insulin  aspart (novoLOG ) injection 0-5 Units  0-5 Units Subcutaneous QHS Hongalgi, Anand D, MD       insulin  aspart (novoLOG ) injection 0-9 Units  0-9 Units Subcutaneous TID WC Hongalgi, Anand D, MD   3 Units at 07/07/23 1228   isosorbide  mononitrate (IMDUR ) 24 hr tablet 15 mg  15 mg Oral Daily Tu, Ching T, DO   15 mg at 07/07/23 0811   levothyroxine  (SYNTHROID ) tablet 50 mcg  50 mcg Oral Q0600 Tu, Ching T, DO   50 mcg at 07/07/23 0700   lidocaine  (LIDODERM ) 5 % 1 patch  1 patch Transdermal Q24H Hongalgi, Anand D, MD   1 patch at 07/07/23 1227   midodrine  (PROAMATINE ) tablet 5 mg  5 mg Oral BID WC Hongalgi, Anand D, MD   5 mg at 07/07/23 9188   pantoprazole  (PROTONIX ) EC tablet 40 mg  40 mg Oral Daily Tu, Ching T, DO   40 mg at 07/07/23 9188   ranolazine  (RANEXA ) 12 hr tablet 500 mg  500 mg Oral BID Tu, Ching T, DO   500 mg at 07/07/23 9182   sodium chloride  flush (NS) 0.9 % injection 3 mL  3 mL Intravenous Q12H Tu,  Ching T, DO   3 mL at 07/07/23 9187   traMADol  (ULTRAM ) tablet 50 mg  50 mg Oral Q12H PRN Hongalgi, Anand D, MD         Discharge Medications: Please see discharge summary for a list of discharge medications.  Relevant Imaging Results:  Relevant Lab Results:   Additional Information SS# [id number]  Noraa Pickeral [place], [place]

## 2023-07-07 NOTE — TOC Initial Note (Signed)
 Transition of Care [facility]) - Initial/Assessment Note    Patient Details  Name: Carol [name] MRN: [id number] Date of Birth: September 21, 1935  [facility] CM/SW Contact:    Gwenn Julien Norris, LCSW Phone Number: 07/07/2023, 2:00 PM  Clinical Narrative: Spoke to pt and pt's son re PT recommendation for SNF. Pt reports agreeable to SNF for STR and is requesting [facility] or [facility] ([facility]). Reviewed SNF placement process answered questions. Will begin SNF search and f/u with offers as available. Pt will require auth for SNF.  Julien Gwenn, MSW, LCSW [phone] (coverage)                    Expected Discharge Plan: Skilled Nursing Facility Barriers to Discharge: Continued Medical Work up, English As A Second Language Teacher, SNF Pending bed offer   Patient Goals and CMS Choice   CMS Medicare.gov Compare Post Acute Care list provided to:: Patient Choice offered to / list presented to : Patient [place] ownership interest in [facility]provided to:: Patient    Expected Discharge Plan and Services     Post Acute Care Choice: Skilled Nursing Facility Living arrangements for the past 2 months: Single Family Home                                      Prior Living Arrangements/Services Living arrangements for the past 2 months: Single Family Home Lives with:: Self Patient language and need for interpreter reviewed:: Yes        Need for Family Participation in Patient Care: Yes (Comment) Care giver support system in place?: Yes (comment)   Criminal Activity/Legal Involvement Pertinent to Current Situation/Hospitalization: No - Comment as needed  Activities of Daily Living   ADL Screening (condition at time of admission) Independently performs ADLs?: Yes (appropriate for developmental age) Is the patient deaf or have difficulty hearing?: Yes Does the patient have difficulty seeing, even when wearing glasses/contacts?:  No Does the patient have difficulty concentrating, remembering, or making decisions?: No  Permission Sought/Granted                  Emotional Assessment       Orientation: : Oriented to Self, Oriented to  Time, Oriented to Place, Oriented to Situation Alcohol / Substance Use: Not Applicable Psych Involvement: No (comment)  Admission diagnosis:  Syncope [R55] AKI (acute kidney injury) (HCC) [N17.9] Syncope, unspecified syncope type [R55] Leukocytosis, unspecified type [D72.829] Patient Active Problem List   Diagnosis Date Noted   AKI (acute kidney injury) (HCC) 07/04/2023   Type 2 diabetes mellitus with chronic kidney disease, with long-term current use of insulin  (HCC) 07/04/2023   Malaise 06/22/2023   Encounter for Medicare annual wellness exam 05/06/2023   Acute cystitis without hematuria 02/20/2023   Herpes zoster without complication 12/11/2022   Essential hypertension 10/27/2022   Syncope 08/09/2022   Anemia 08/02/2022   Asthma 08/02/2022   Carotid stenosis 08/02/2022   Contrast media allergy 08/02/2022   Environmental allergies 08/02/2022   HOH (hard of hearing) 08/02/2022   LBBB (left bundle branch block) 08/02/2022   NSTEMI (non-ST elevated myocardial infarction) (HCC) 08/02/2022   QT prolongation 08/02/2022   TIA (transient ischemic attack) 08/02/2022   Sinus bradycardia 07/18/2022   Class 1 obesity due to excess calories with serious comorbidity and body mass index (BMI) of 30.0 to 30.9 in adult 01/14/2022   Dizziness 11/10/2021  Osteoporosis screening 10/03/2021   Myalgia due to statin 10/03/2021   Chronic idiopathic constipation 09/28/2021   S/P TKR (total knee replacement) using cement, left 04/21/2018   Hx of CABG 04/25/2015   GERD (gastroesophageal reflux disease) 04/19/2015   Hypertensive heart and renal disease 04/19/2015   Spinal stenosis, lumbar region, with neurogenic claudication 12/22/2014   Controlled type 2 diabetes mellitus with  diabetic neuropathy, with long-term current use of insulin  (HCC) 04/10/2014   Asthma, moderate persistent 03/23/2014   Post-infarction angina (HCC) 02/17/2014   Abnormal nuclear stress test: Intermediate risk 02/17/2014   Presence of drug coated stent in left circumflex coronary artery: OM2 - Xience Alpine DES 2.25 mm x 12, 2.25 mm x 8 mm overlap 02/17/2014    Class: Acute   Ischemic cardiomyopathy - with near resolution of LVEF post MI 02/15/2014   Heart failure with improved ejection fraction (HFimpEF) (HCC) 02/15/2014   Chronic kidney disease, stage 4 (severe) (HCC) 02/15/2014   Left-sided chest pain 01/22/2014   Hyperlipidemia 01/22/2014   History of non-ST elevation myocardial infarction (NSTEMI) 01/22/2014   CAD in native artery 01/22/2014   Mixed conductive and sensorineural hearing loss 12/05/2012   Acquired hypothyroidism 12/05/2012   Lower back pain 12/05/2012   Gout, unspecified    PCP:  Sherre Clapper, MD Pharmacy:   [facility] - [place], [place] - [address] [place] [address] Phone: [phone] Fax: [phone]  [facility] - [place], [place] - [address] [place] [address] [place] [address] [place] [address] Phone: [phone] Fax: [phone]     Social Drivers of Health (SDOH) Social History: SDOH Screenings   Food Insecurity: No Food Insecurity (07/05/2023)  Housing: Low Risk  (07/05/2023)  Transportation Needs: No Transportation Needs (07/05/2023)  Utilities: Not At Risk (07/05/2023)  Alcohol Screen: Low Risk  (10/22/2022)  Depression (PHQ2-9): Low Risk  (05/06/2023)  Financial Resource Strain: Low Risk  (10/22/2022)  Physical Activity: Sufficiently Active (10/22/2022)  Social Connections: Moderately Isolated (07/05/2023)  Stress: No Stress Concern Present (10/22/2022)  Tobacco Use: Low Risk  (07/04/2023)  Health Literacy: Adequate Health Literacy (03/04/2023)   SDOH Interventions:     Readmission Risk Interventions      No data to display

## 2023-07-07 NOTE — Plan of Care (Signed)

## 2023-07-07 NOTE — Progress Notes (Signed)
 PROGRESS NOTE   Carol [name]  FMW:[id number]    DOB: [birth year]    DOA: 07/04/2023  PCP: Sherre Clapper, MD   I have briefly reviewed patients previous medical records in [facility]  Chief Complaint  Patient presents with   Loss of Consciousness    Brief Hospital Course:  88 y.o. female with medical history significant of CAD s/p stent and CABG, HFrEF, HTN, T2DM, asthma, hypothyroidism, CKD4, hard of hearing who presents with syncope and fall.  Admitted for syncope, fall, orthostatic hypotension and acute on chronic kidney disease, suspected due to poor oral intake for several days PTA.  Ongoing orthostatic hypotension despite IVF hydration,?  Autonomic dysfunction.  Initiated midodrine .  Patient declining STR SNF for rehab and insists on returning home with help of friends to assist her.  Addendum: 1/12, patient now seems reluctantly agreeable to go to SNF for STR but prefers [facility].  TOC consulted.  Medically stable pending bed.   Assessment & Plan:  Principal Problem:   Syncope Active Problems:   Acquired hypothyroidism   Hx of CABG   AKI (acute kidney injury) (HCC)   Type 2 diabetes mellitus with chronic kidney disease, with long-term current use of insulin  (HCC)   Syncope and fall/collapse Suspected due to orthostatic hypotension from dehydration due to poor oral intake for several days PTA.  Unclear if orthostatic hypotension is also due to autonomic dysfunction. Clearly orthostatic on 1/10 and 1/11 Telemetry shows sinus rhythm with first-degree AV block and no arrhythmias.  Discontinued telemetry. 2D echo: LVEF 60-65% grade 1 diastolic dysfunction.  No aortic stenosis. HS Troponin negative. Treat orthostatic hypotension as below. PT recommends SNF, TOC consulted.  However 1/11 patient insists that she does not want to go to [facility] for rehab and insists on discharging home and says that she has several friends that we will come and assist her throughout the daytime  and she does not want anybody at night. Driving restrictions to be counseled at time of discharge. Extensive imaging (CT C/A/P, CT C and L-spine, CT head, x-ray pelvis) without acute findings.  1/12, patient now seems reluctantly agreeable to go to SNF for STR but prefers [facility].  TOC consulted.  Medically stable pending bed.  Orthostatic hypotension/dehydration Due to several days history of nausea, dry heaves, poor oral intake due to feeling sick and possibly due to orthostatic hypotension IV fluids, bilateral TED hose and monitor Remains significantly orthostatic, BP down from 145/50 standing to 83/49 and symptomatic of dizziness. Volume status may be close to euvolemic but will continue additional 24 hours of IV fluids and will add midodrine  5 Mg twice daily. No orthostatic vitals for this morning.  Requested RN to have them done.  Acute kidney injury complicating stage IV CKD Most recent baseline creatinine of 1.78 on 12/27. Presented with creatinine of 2.25. AKI secondary to dehydration. Renal ultrasound: Bilateral mild renal atrophy. No collecting system dilatation. Poor visualization of the urinary bladder After 2 days of IV fluids, creatinine has only slightly improved from 2.25 > 2.19 > 2.05.  Clinically euvolemic.  No further IV fluids.  Continue to trend BMP daily while in the hospital and periodically thereafter at [facility].  This may take some more time to reach her baseline or she may have obtained a new baseline.  Type 2 diabetes mellitus with chronic kidney disease, with long-term current use of insulin  (HCC) -Last A1c 7.9 on 12/27 suggesting poor outpatient control. -Moderate SSI.  Labile.  Continue  current regimen without change.  Essential hypertension: Controlled on carvedilol , Imdur .   Hx of CABG - Patient complaining of posterior neck pain which feels similar to her prior MI.  However, her pain was improved once her neck was adjusted with additional pillows suggesting  more MSK in origin.  Her troponin and EKG also reassuring at this time. -Low suspicion of cardiogenic cause of her syncope.  -continue Plavix , Imdur , carvedilol  and Ranexa  -No further neck pain or angina.   Acquired hypothyroidism - Continue levothyroxine .  TSH 2.750 in August 2024  Asymptomatic bacteriuria Denies UTI symptoms.  Afebrile.  Transient leukocytosis improved without antibiotics.  >100 K colonies of E. coli on urine culture likely colonization.  Hold off antibiotics unless clinical UTI.  Low back pain Noted after fall. Imaging as noted above without acute findings Added lidocaine  patch, change acetaminophen  1 g 3 times daily while here. Added as needed low-dose tramadol .   Body mass index is 29.26 kg/m.   DVT prophylaxis: Place TED hose Start: 07/05/23 1146 heparin  injection 5,000 Units Start: 07/04/23 2200     Code Status: Limited: Do not attempt resuscitation (DNR) -DNR-LIMITED -Do Not Intubate/DNI :  Family Communication: son via phone. Disposition:  Medically stable for DC to SNF pending bed.     Consultants:     Procedures:     Antimicrobials:      Subjective:  Some back soreness but better compared to yesterday.  Today seems to be reluctantly agreeable to go to SNF but prefers [facility].  Objective:   Vitals:   07/07/23 0330 07/07/23 0500 07/07/23 0600 07/07/23 0716  BP: (!) 132/52   (!) 149/57  Pulse: 66   61  Resp: 15  16 16   Temp: 98.9 F (37.2 C)   98.2 F (36.8 C)  TempSrc: Oral   Oral  SpO2: 94%   93%  Weight:  86 kg    Height:       Unchanged and stable exam compared to yesterday.  General exam: Elderly female, moderately built and frail lying comfortably propped up in bed without distress.  Oral mucosa borderline hydration.  Extremely hard of hearing.  Does not have her hearing aid. Respiratory system: Clear to auscultation.  No creased work of breathing Cardiovascular system: S1 & S2 heard, RRR. No JVD, murmurs, rubs, gallops  or clicks. No pedal edema.  Telemetry personally reviewed: Sinus rhythm. Gastrointestinal system: Abdomen is nondistended, soft and nontender. No organomegaly or masses felt. Normal bowel sounds heard. Central nervous system: Alert and oriented. No focal neurological deficits. Extremities: Symmetric 5 x 5 power.  Some bruising of her upper extremities. Skin: No rashes, lesions or ulcers Psychiatry: Judgement and insight appear normal. Mood & affect appropriate. Musculoskeletal: No deformity or tenderness across lower back.     Data Reviewed:   I have personally reviewed following labs and imaging studies   CBC: Recent Labs  Lab 07/04/23 1503 07/05/23 0730  WBC 17.4* 11.3*  NEUTROABS 15.0*  --   HGB 13.5 12.4  HCT 41.3 37.4  MCV 96.3 96.1  PLT 265 220    Basic Metabolic Panel: Recent Labs  Lab 07/04/23 1503 07/05/23 0730 07/06/23 0604 07/07/23 0629  NA 138 137 137 136  K 4.6 4.6 4.7 4.6  CL 97* 96* 99 102  CO2 27 26 27 25   GLUCOSE 146* 230* 148* 177*  BUN 51* 51* 49* 46*  CREATININE 2.25* 2.21* 2.19* 2.05*  CALCIUM  10.0 9.4 9.6 9.1    Liver Function  Tests: Recent Labs  Lab 07/04/23 1503  AST 28  ALT 18  ALKPHOS 126  BILITOT 0.9  PROT 7.3  ALBUMIN  4.0    CBG: Recent Labs  Lab 07/06/23 1602 07/06/23 2142 07/07/23 0641  GLUCAP 167* 184* 188*    Microbiology Studies:   Recent Results (from the past 240 hours)  Resp panel by RT-PCR (RSV, Flu A&B, Covid) Urine, Clean Catch     Status: None   Collection Time: 07/04/23  8:43 PM   Specimen: Urine, Clean Catch; Nasal Swab  Result Value Ref Range Status   SARS Coronavirus 2 by RT PCR NEGATIVE NEGATIVE Final   Influenza A by PCR NEGATIVE NEGATIVE Final   Influenza B by PCR NEGATIVE NEGATIVE Final    Comment: (NOTE) The Xpert Xpress SARS-CoV-2/FLU/RSV plus assay is intended as an aid in the diagnosis of influenza from Nasopharyngeal swab specimens and should not be used as a sole basis for treatment.  Nasal washings and aspirates are unacceptable for Xpert Xpress SARS-CoV-2/FLU/RSV testing.  Fact Sheet for Patients: bloggercourse.com  Fact Sheet for Healthcare Providers: seriousbroker.it  This test is not yet approved or cleared by the [place]  FDA and has been authorized for detection and/or diagnosis of SARS-CoV-2 by FDA under an Emergency Use Authorization (EUA). This EUA will remain in effect (meaning this test can be used) for the duration of the COVID-19 declaration under Section 564(b)(1) of the Act, 21 U.S.C. section 360bbb-3(b)(1), unless the authorization is terminated or revoked.     Resp Syncytial Virus by PCR NEGATIVE NEGATIVE Final    Comment: (NOTE) Fact Sheet for Patients: bloggercourse.com  Fact Sheet for Healthcare Providers: seriousbroker.it  This test is not yet approved or cleared by the [place]  FDA and has been authorized for detection and/or diagnosis of SARS-CoV-2 by FDA under an Emergency Use Authorization (EUA). This EUA will remain in effect (meaning this test can be used) for the duration of the COVID-19 declaration under Section 564(b)(1) of the Act, 21 U.S.C. section 360bbb-3(b)(1), unless the authorization is terminated or revoked.  Performed at [facility], [address] [place], [place] [address]   Urine Culture     Status: Abnormal (Preliminary result)   Collection Time: 07/04/23  8:43 PM   Specimen: Urine, Random  Result Value Ref Range Status   Specimen Description URINE, RANDOM  Final   Special Requests NONE Reflexed from [id number]  Final   Culture (A)  Final    >=100,000 COLONIES/mL ESCHERICHIA COLI CULTURE REINCUBATED FOR BETTER GROWTH Performed at [facility], [address], [place], [place] [address]    Report Status PENDING  Incomplete   Organism ID, Bacteria ESCHERICHIA COLI (A)  Final       Susceptibility   Escherichia coli - MIC*    AMPICILLIN >=32 RESISTANT Resistant     CEFAZOLIN  <=4 SENSITIVE Sensitive     CEFEPIME <=0.12 SENSITIVE Sensitive     CEFTRIAXONE  <=0.25 SENSITIVE Sensitive     CIPROFLOXACIN  <=0.25 SENSITIVE Sensitive     GENTAMICIN <=1 SENSITIVE Sensitive     IMIPENEM <=0.25 SENSITIVE Sensitive     NITROFURANTOIN <=16 SENSITIVE Sensitive     TRIMETH /SULFA  >=320 RESISTANT Resistant     AMPICILLIN/SULBACTAM >=32 RESISTANT Resistant     PIP/TAZO 64 INTERMEDIATE Intermediate ug/mL    * >=100,000 COLONIES/mL ESCHERICHIA COLI    Radiology Studies:  US  RENAL Result Date: 07/05/2023 CLINICAL DATA:  Acute kidney injury EXAM: RENAL / URINARY TRACT ULTRASOUND COMPLETE COMPARISON:  Noncontrast CT 07/04/2023. FINDINGS: Right Kidney: Renal measurements: 9.4 x 4.3 x 4.0 cm = volume: 82.6 mL. Mild parenchymal atrophy. No collecting system dilatation or perinephric fluid. Left Kidney: Renal measurements: 8.4 x 3.1 x 5.2 cm = volume: 69.8 mL. Global parenchymal atrophy. No collecting system dilatation or perinephric fluid. Bladder: Poorly seen.  This could be collapsed. Other: None IMPRESSION: Bilateral mild renal atrophy. No collecting system dilatation. Poor visualization of the urinary bladder Electronically Signed   By: Ranell Bring M.D.   On: 07/05/2023 13:10   ECHOCARDIOGRAM COMPLETE Result Date: 07/05/2023    ECHOCARDIOGRAM REPORT   Patient Name:   DAVETTE NUGENT Date of Exam: 07/05/2023 Medical Rec #:  [id number]        Height:       67.5 in Accession #:    [id number]       Weight:       187.6 lb Date of Birth:  [birth year]        BSA:          1.979 m Patient Age:    87 years         BP:           112/82 mmHg Patient Gender: F                HR:           70 bpm. Exam Location:  Inpatient Procedure: 2D Echo, Color Doppler, Cardiac Doppler and Intracardiac            Opacification Agent Indications:    Stroke I63.9  History:        Patient has prior history of Echocardiogram  examinations, most                 recent 01/16/2022.  Sonographer:    Tinnie Gosling RDCS Referring Phys: [phone] Page Lancon D Honora Searson IMPRESSIONS  1. Left ventricular ejection fraction, by estimation, is 60 to 65%. The left ventricle has normal function. The left ventricle has no regional wall motion abnormalities. There is mild concentric left ventricular hypertrophy. Left ventricular diastolic parameters are consistent with Grade I diastolic dysfunction (impaired relaxation).  2. Right ventricular systolic function is normal. The right ventricular size is not well visualized.  3. Left atrial size was moderately dilated.  4. The mitral valve is normal in structure. Trivial mitral valve regurgitation. No evidence of mitral stenosis.  5. The aortic valve is tricuspid. There is mild calcification of the aortic valve. Aortic valve regurgitation is trivial. Aortic valve sclerosis/calcification is present, without any evidence of aortic stenosis.  6. The inferior vena cava is normal in size with greater than 50% respiratory variability, suggesting right atrial pressure of 3 mmHg. FINDINGS  Left Ventricle: Left ventricular ejection fraction, by estimation, is 60 to 65%. The left ventricle has normal function. The left ventricle has no regional wall motion abnormalities. The left ventricular internal cavity size was normal in size. There is  mild concentric left ventricular hypertrophy. Left ventricular diastolic parameters are consistent with Grade I diastolic dysfunction (impaired relaxation). Right Ventricle: The right ventricular size is not well visualized. No increase in right ventricular wall thickness. Right ventricular systolic function is normal. Left Atrium: Left atrial size was moderately dilated. Right Atrium: Right atrial size was normal in size. Pericardium: There is no evidence of pericardial effusion. Mitral Valve: The mitral valve is normal in structure. Trivial mitral valve regurgitation. No evidence of mitral  valve stenosis. Tricuspid Valve: The tricuspid valve  is normal in structure. Tricuspid valve regurgitation is trivial. No evidence of tricuspid stenosis. Aortic Valve: The aortic valve is tricuspid. There is mild calcification of the aortic valve. Aortic valve regurgitation is trivial. Aortic valve sclerosis/calcification is present, without any evidence of aortic stenosis. Pulmonic Valve: The pulmonic valve was normal in structure. Pulmonic valve regurgitation is not visualized. No evidence of pulmonic stenosis. Aorta: The aortic root is normal in size and structure. Venous: The inferior vena cava is normal in size with greater than 50% respiratory variability, suggesting right atrial pressure of 3 mmHg. IAS/Shunts: No atrial level shunt detected by color flow Doppler.  LEFT VENTRICLE PLAX 2D LVIDd:         4.70 cm   Diastology LVIDs:         2.80 cm   LV e' medial:    3.81 cm/s LV PW:         1.10 cm   LV E/e' medial:  21.5 LV IVS:        1.10 cm   LV e' lateral:   7.07 cm/s LVOT diam:     2.30 cm   LV E/e' lateral: 11.6 LV SV:         43 LV SV Index:   22 LVOT Area:     4.15 cm  LEFT ATRIUM           Index LA diam:      3.80 cm 1.92 cm/m LA Vol (A4C): 87.1 ml 44.01 ml/m  AORTIC VALVE LVOT Vmax:   57.50 cm/s LVOT Vmean:  37.000 cm/s LVOT VTI:    0.103 m  AORTA Ao Root diam: 2.90 cm Ao Asc diam:  3.30 cm MITRAL VALVE MV Area (PHT): 2.27 cm     SHUNTS MV E velocity: 82.00 cm/s   Systemic VTI:  0.10 m MV A velocity: 132.00 cm/s  Systemic Diam: 2.30 cm MV E/A ratio:  0.62 Toribio Fuel MD Electronically signed by Toribio Fuel MD Signature Date/Time: 07/05/2023/9:53:58 AM    Final     Scheduled Meds:    calcium  carbonate  625 mg Oral Q breakfast   carvedilol   6.25 mg Oral BID WC   clopidogrel   75 mg Oral Daily   ezetimibe   10 mg Oral Daily   gabapentin   300 mg Oral BID   heparin   5,000 Units Subcutaneous Q8H   insulin  aspart  0-5 Units Subcutaneous QHS   insulin  aspart  0-9 Units Subcutaneous TID  WC   isosorbide  mononitrate  15 mg Oral Daily   levothyroxine   50 mcg Oral Q0600   lidocaine   1 patch Transdermal Q24H   midodrine   5 mg Oral BID WC   pantoprazole   40 mg Oral Daily   ranolazine   500 mg Oral BID   sodium chloride  flush  3 mL Intravenous Q12H    Continuous Infusions:    lactated ringers  Stopped (07/06/23 1900)     LOS: 2 days     Trenda Mar, MD,  FACP, [facility], [facility], [facility]   [facility] [place]      To contact the attending provider between 7A-7P or the covering provider during after hours 7P-7A, please log into the web site [email or web address] and access using universal [place] password for that web site. If you do not have the password, please call the hospital operator.  07/07/2023, 8:37 AM

## 2023-07-07 NOTE — Plan of Care (Signed)
  Problem: Education: Goal: Knowledge of condition and prescribed therapy will improve Outcome: Progressing   Problem: Cardiac: Goal: Will achieve and/or maintain adequate cardiac output Outcome: Progressing   Problem: Physical Regulation: Goal: Complications related to the disease process, condition or treatment will be avoided or minimized Outcome: Progressing   Problem: Education: Goal: Ability to describe self-care measures that may prevent or decrease complications (Diabetes Survival Skills Education) will improve Outcome: Progressing Goal: Individualized Educational Video(s) Outcome: Progressing

## 2023-07-08 ENCOUNTER — Other Ambulatory Visit (HOSPITAL_COMMUNITY): Payer: Self-pay

## 2023-07-08 DIAGNOSIS — N179 Acute kidney failure, unspecified: Secondary | ICD-10-CM | POA: Diagnosis not present

## 2023-07-08 DIAGNOSIS — I951 Orthostatic hypotension: Secondary | ICD-10-CM | POA: Diagnosis not present

## 2023-07-08 LAB — BASIC METABOLIC PANEL
Anion gap: 13 (ref 5–15)
BUN: 46 mg/dL — ABNORMAL HIGH (ref 8–23)
CO2: 24 mmol/L (ref 22–32)
Calcium: 9.1 mg/dL (ref 8.9–10.3)
Chloride: 99 mmol/L (ref 98–111)
Creatinine, Ser: 2.18 mg/dL — ABNORMAL HIGH (ref 0.44–1.00)
GFR, Estimated: 22 mL/min — ABNORMAL LOW (ref >60)
Glucose, Bld: 156 mg/dL — ABNORMAL HIGH (ref 70–99)
Potassium: 4.3 mmol/L (ref 3.5–5.1)
Sodium: 136 mmol/L (ref 135–145)

## 2023-07-08 LAB — GLUCOSE, CAPILLARY
Glucose-Capillary: 174 mg/dL — ABNORMAL HIGH (ref 70–99)
Glucose-Capillary: 275 mg/dL — ABNORMAL HIGH (ref 70–99)
Glucose-Capillary: 153 mg/dL — ABNORMAL HIGH (ref 70–99)

## 2023-07-08 MED ORDER — MIDODRINE HCL 2.5 MG PO TABS
2.5000 mg | ORAL_TABLET | Freq: Three times a day (TID) | ORAL | 0 refills | Status: DC
  Filled 2023-07-08: qty 90, 30d supply, fill #0

## 2023-07-08 MED ORDER — ACETAMINOPHEN 500 MG PO TABS: 1000.0000 mg | ORAL_TABLET | Freq: Three times a day (TID) | ORAL | Status: AC | PRN

## 2023-07-08 MED ORDER — METHOCARBAMOL 500 MG PO TABS: 500.0000 mg | ORAL_TABLET | Freq: Four times a day (QID) | ORAL | Status: DC | PRN

## 2023-07-08 MED ORDER — GABAPENTIN 300 MG PO CAPS: 300.0000 mg | ORAL_CAPSULE | Freq: Two times a day (BID) | ORAL | Status: DC

## 2023-07-08 MED ORDER — FUROSEMIDE 80 MG PO TABS: 80.0000 mg | ORAL_TABLET | ORAL | Status: DC | PRN

## 2023-07-08 MED ORDER — MIDODRINE HCL 5 MG PO TABS
2.5000 mg | ORAL_TABLET | Freq: Three times a day (TID) | ORAL | Status: DC
Start: 2023-07-08 — End: 2023-07-08
  Administered 2023-07-08: 2.5 mg via ORAL
  Filled 2023-07-08: qty 1

## 2023-07-08 MED ORDER — LIDOCAINE 5 % EX PTCH
1.0000 | MEDICATED_PATCH | CUTANEOUS | 0 refills | Status: DC
  Filled 2023-07-08: qty 9, 9d supply, fill #0

## 2023-07-08 MED ORDER — ALLOPURINOL 300 MG PO TABS: 150.0000 mg | ORAL_TABLET | Freq: Every day | ORAL | Status: DC

## 2023-07-08 NOTE — Care Management Important Message (Signed)
 Important Message  Patient Details  Name: Carol [name] MRN: [id number] Date of Birth: [birth year]   Important Message Given:  Yes - Medicare IM     Dorena Bodo 07/08/2023, 2:19 PM

## 2023-07-08 NOTE — Progress Notes (Signed)
 PROGRESS NOTE   Carol [name]  FMW:[id number]    DOB: December 31, 1935    DOA: 07/04/2023  PCP: Sherre Clapper, MD   I have briefly reviewed patients previous medical records in [facility].  Chief Complaint  Patient presents with   Loss of Consciousness    Brief Hospital Course:  88 y.o. female with medical history significant of CAD s/p stent and CABG, HFrEF, HTN, T2DM, asthma, hypothyroidism, CKD4, hard of hearing who presents with syncope and fall.  Admitted for syncope, fall, orthostatic hypotension and acute on chronic kidney disease, suspected due to poor oral intake for several days PTA.  Ongoing orthostatic hypotension despite IVF hydration,?  Autonomic dysfunction.  Initiated midodrine .  Patient declining STR SNF for rehab and insists on returning home with help of friends to assist her.  PT continues to recommend SNF for STR.  TOC working on placement at [facility] in [place], reportedly have a bed, insurance to be initiated.   Assessment & Plan:  Principal Problem:   Syncope Active Problems:   Acquired hypothyroidism   Hx of CABG   AKI (acute kidney injury) (HCC)   Type 2 diabetes mellitus with chronic kidney disease, with long-term current use of insulin  (HCC)   Syncope and fall/collapse Suspected due to orthostatic hypotension from dehydration due to poor oral intake for several days PTA.  Unclear if orthostatic hypotension is also due to autonomic dysfunction. Clearly orthostatic on 1/10 and 1/11 Telemetry shows sinus rhythm with first-degree AV block and no arrhythmias.  Discontinued telemetry. 2D echo: LVEF 60-65% grade 1 diastolic dysfunction.  No aortic stenosis. HS Troponin negative. Treat orthostatic hypotension as below. Driving restrictions to be counseled at time of discharge. Extensive imaging (CT C/A/P, CT C and L-spine, CT head, x-ray pelvis) without acute findings. PT evaluated today and continue to recommend SNF for STR.  Patient prefers to go home  but remains reluctantly agreeable to SNF.  Per TOC discussion, [facility] have a bed/TOC waiting to hear back from them and will need insurance approval.  Orthostatic hypotension/dehydration Due to several days history of nausea, dry heaves, poor oral intake due to feeling sick and possibly due to orthostatic hypotension IV fluids, bilateral TED hose and monitor Currently orthostatic early on in admission. Treated with a couple days of IV fluids but remained orthostatic, started low-dose midodrine  5 Mg twice daily.  Now appears to have some supine hypertension with ongoing orthostatic findings with SBP dropped from 168/64 supine to 129/82 standing.  Will cut back on midodrine  dose to 2.5 Mg 3 times daily.  Patient however appears to be less or asymptomatic.  Acute kidney injury complicating stage IV CKD Most recent baseline creatinine of 1.78 on 12/27. Presented with creatinine of 2.25. AKI secondary to dehydration. Renal ultrasound: Bilateral mild renal atrophy. No collecting system dilatation. Poor visualization of the urinary bladder Despite almost 48 hours of IV fluid hydration, creatinine has plateaued in the low 2 range since admission.  Patient may have achieved a new baseline. Follow BMP periodically as outpatient.  Type 2 diabetes mellitus with chronic kidney disease, with long-term current use of insulin  (HCC) -Last A1c 7.9 on 12/27 suggesting poor outpatient control. -Moderate SSI.  Labile.  Continue current regimen without change.  Essential hypertension: Controlled on carvedilol , Imdur .   Hx of CABG - Patient complaining of posterior neck pain which feels similar to her prior MI.  However, her pain was improved once her neck was adjusted with additional pillows suggesting  more MSK in origin.  Her troponin and EKG also reassuring at this time. -Low suspicion of cardiogenic cause of her syncope.  -continue Plavix , Imdur , carvedilol  and Ranexa  -No further neck pain  or angina.   Acquired hypothyroidism - Continue levothyroxine .  TSH 2.750 in August 2024  Asymptomatic bacteriuria Denies UTI symptoms.  Afebrile.  Transient leukocytosis improved without antibiotics.  >100 K colonies of E. coli on urine culture likely colonization.  Hold off antibiotics unless clinical UTI.  Low back pain Noted after fall. Imaging as noted above without acute findings Added lidocaine  patch, change acetaminophen  1 g 3 times daily while here. Added as needed low-dose tramadol -has not used any. States that after she did some stretches yesterday, her low back pain is significantly better.   Body mass index is 29.26 kg/m.   DVT prophylaxis: Place TED hose Start: 07/05/23 1146 heparin  injection 5,000 Units Start: 07/04/23 2200     Code Status: Limited: Do not attempt resuscitation (DNR) -DNR-LIMITED -Do Not Intubate/DNI :  Family Communication: son via phone. Disposition:  Medically stable for DC to SNF pending bed.     Consultants:     Procedures:     Antimicrobials:      Subjective:  States that after doing some low back stretches that were advised to her husband, her low back pain is much better and she ambulated in the hall with PT this morning.  PT note pending.  Advised her that PT continues to recommend STR.  Patient reluctant, prefers to go home but agreeable to SNF.  Objective:   Vitals:   07/08/23 0328 07/08/23 0454 07/08/23 0731 07/08/23 1158  BP: (!) 156/54  (!) 157/61 (!) 149/50  Pulse: (!) 55  (!) 53 (!) 51  Resp: 18  14 16   Temp: 98.5 F (36.9 C)  98.2 F (36.8 C) 98 F (36.7 C)  TempSrc: Oral  Oral Oral  SpO2: 93%  95% 97%  Weight:  86 kg    Height:        General exam: Elderly female, moderately built and frail sitting up comfortably in reclining chair without distress.  Oral mucosa moist.  Appears to be in good spirits. Respiratory system: Clear to auscultation.  No creased work of breathing Cardiovascular system: S1 & S2  heard, RRR. No JVD, murmurs, rubs, gallops or clicks. No pedal edema.  Off of telemetry. Gastrointestinal system: Abdomen is nondistended, soft and nontender. No organomegaly or masses felt. Normal bowel sounds heard. Central nervous system: Alert and oriented. No focal neurological deficits. Extremities: Symmetric 5 x 5 power.  Some bruising of her upper extremities. Skin: No rashes, lesions or ulcers Psychiatry: Judgement and insight appear normal. Mood & affect appropriate. Musculoskeletal: No deformity or tenderness across lower back.     Data Reviewed:   I have personally reviewed following labs and imaging studies   CBC: Recent Labs  Lab 07/04/23 1503 07/05/23 0730  WBC 17.4* 11.3*  NEUTROABS 15.0*  --   HGB 13.5 12.4  HCT 41.3 37.4  MCV 96.3 96.1  PLT 265 220    Basic Metabolic Panel: Recent Labs  Lab 07/04/23 1503 07/05/23 0730 07/06/23 0604 07/07/23 0629 07/08/23 0529  NA 138 137 137 136 136  K 4.6 4.6 4.7 4.6 4.3  CL 97* 96* 99 102 99  CO2 27 26 27 25 24   GLUCOSE 146* 230* 148* 177* 156*  BUN 51* 51* 49* 46* 46*  CREATININE 2.25* 2.21* 2.19* 2.05* 2.18*  CALCIUM  10.0 9.4 9.6  9.1 9.1    Liver Function Tests: Recent Labs  Lab 07/04/23 1503  AST 28  ALT 18  ALKPHOS 126  BILITOT 0.9  PROT 7.3  ALBUMIN  4.0    CBG: Recent Labs  Lab 07/07/23 2140 07/08/23 0723 07/08/23 1156  GLUCAP 151* 174* 275*    Microbiology Studies:   Recent Results (from the past 240 hours)  Resp panel by RT-PCR (RSV, Flu A&B, Covid) Urine, Clean Catch     Status: None   Collection Time: 07/04/23  8:43 PM   Specimen: Urine, Clean Catch; Nasal Swab  Result Value Ref Range Status   SARS Coronavirus 2 by RT PCR NEGATIVE NEGATIVE Final   Influenza A by PCR NEGATIVE NEGATIVE Final   Influenza B by PCR NEGATIVE NEGATIVE Final    Comment: (NOTE) The Xpert Xpress SARS-CoV-2/FLU/RSV plus assay is intended as an aid in the diagnosis of influenza from Nasopharyngeal swab  specimens and should not be used as a sole basis for treatment. Nasal washings and aspirates are unacceptable for Xpert Xpress SARS-CoV-2/FLU/RSV testing.  Fact Sheet for Patients: bloggercourse.com  Fact Sheet for Healthcare Providers: seriousbroker.it  This test is not yet approved or cleared by the [place]  FDA and has been authorized for detection and/or diagnosis of SARS-CoV-2 by FDA under an Emergency Use Authorization (EUA). This EUA will remain in effect (meaning this test can be used) for the duration of the COVID-19 declaration under Section 564(b)(1) of the Act, 21 U.S.C. section 360bbb-3(b)(1), unless the authorization is terminated or revoked.     Resp Syncytial Virus by PCR NEGATIVE NEGATIVE Final    Comment: (NOTE) Fact Sheet for Patients: bloggercourse.com  Fact Sheet for Healthcare Providers: seriousbroker.it  This test is not yet approved or cleared by the [place]  FDA and has been authorized for detection and/or diagnosis of SARS-CoV-2 by FDA under an Emergency Use Authorization (EUA). This EUA will remain in effect (meaning this test can be used) for the duration of the COVID-19 declaration under Section 564(b)(1) of the Act, 21 U.S.C. section 360bbb-3(b)(1), unless the authorization is terminated or revoked.  Performed at [facility], [address] [place], [place] [address]   Urine Culture     Status: Abnormal   Collection Time: 07/04/23  8:43 PM   Specimen: Urine, Random  Result Value Ref Range Status   Specimen Description URINE, RANDOM  Final   Special Requests   Final    NONE Reflexed from [id number] Performed at [facility], [address], [place], [place] [address]    Culture (A)  Final    >=100,000 COLONIES/mL ESCHERICHIA COLI 90,000 COLONIES/mL ESCHERICHIA COLI    Report Status 07/07/2023 FINAL  Final   Organism ID,  Bacteria ESCHERICHIA COLI (A)  Final   Organism ID, Bacteria ESCHERICHIA COLI (A)  Final      Susceptibility   Escherichia coli - MIC*    AMPICILLIN >=32 RESISTANT Resistant     CEFAZOLIN  <=4 SENSITIVE Sensitive     CEFEPIME <=0.12 SENSITIVE Sensitive     CEFTRIAXONE  <=0.25 SENSITIVE Sensitive     CIPROFLOXACIN  <=0.25 SENSITIVE Sensitive     GENTAMICIN <=1 SENSITIVE Sensitive     IMIPENEM <=0.25 SENSITIVE Sensitive     NITROFURANTOIN <=16 SENSITIVE Sensitive     TRIMETH /SULFA  >=320 RESISTANT Resistant     AMPICILLIN/SULBACTAM >=32 RESISTANT Resistant     PIP/TAZO 64 INTERMEDIATE Intermediate ug/mL   Escherichia coli - MIC*    AMPICILLIN >=32 RESISTANT Resistant  CEFAZOLIN  <=4 SENSITIVE Sensitive     CEFEPIME <=0.12 SENSITIVE Sensitive     CEFTRIAXONE  <=0.25 SENSITIVE Sensitive     CIPROFLOXACIN  <=0.25 SENSITIVE Sensitive     GENTAMICIN <=1 SENSITIVE Sensitive     IMIPENEM 0.5 SENSITIVE Sensitive     NITROFURANTOIN <=16 SENSITIVE Sensitive     TRIMETH /SULFA  >=320 RESISTANT Resistant     AMPICILLIN/SULBACTAM 8 SENSITIVE Sensitive     PIP/TAZO <=4 SENSITIVE Sensitive ug/mL    * >=100,000 COLONIES/mL ESCHERICHIA COLI    90,000 COLONIES/mL ESCHERICHIA COLI    Radiology Studies:  No results found.   Scheduled Meds:    acetaminophen   1,000 mg Oral TID   calcium  carbonate  625 mg Oral Q breakfast   carvedilol   6.25 mg Oral BID WC   clopidogrel   75 mg Oral Daily   ezetimibe   10 mg Oral Daily   gabapentin   300 mg Oral BID   heparin   5,000 Units Subcutaneous Q8H   insulin  aspart  0-5 Units Subcutaneous QHS   insulin  aspart  0-9 Units Subcutaneous TID WC   isosorbide  mononitrate  15 mg Oral Daily   levothyroxine   50 mcg Oral Q0600   lidocaine   1 patch Transdermal Q24H   midodrine   5 mg Oral BID WC   pantoprazole   40 mg Oral Daily   ranolazine   500 mg Oral BID   sodium chloride  flush  3 mL Intravenous Q12H    Continuous Infusions:       LOS: 3 days     Trenda Mar, MD,  FACP, [facility], [facility]   [facility] [place]      To contact the attending provider between 7A-7P or the covering provider during after hours 7P-7A, please log into the web site [email or web address] and access using universal [place] password for that web site. If you do not have the password, please call the hospital operator.  07/08/2023, 12:47 PM

## 2023-07-08 NOTE — TOC Progression Note (Signed)
 Transition of Care [facility]) - Progression Note    Patient Details  Name: Carol [name] MRN: [id number] Date of Birth: 05/31/1936  Transition [facility]) CM/SW Contact  Almarie CHRISTELLA [place], [place] Phone Number: 07/08/2023, 10:49 AM  Clinical Narrative:   CSW sent referral to [facility] and asked them to review for [place], awaiting response. CSW to follow.    Expected Discharge Plan: Skilled Nursing Facility Barriers to Discharge: Continued Medical Work up, English As A Second Language Teacher, SNF Pending bed offer  Expected Discharge Plan and Services     Post Acute Care Choice: Skilled Nursing Facility Living arrangements for the past 2 months: Single Family Home                                       Social Determinants of Health (SDOH) Interventions SDOH Screenings   Food Insecurity: No Food Insecurity (07/05/2023)  Housing: Low Risk  (07/05/2023)  Transportation Needs: No Transportation Needs (07/05/2023)  Utilities: Not At Risk (07/05/2023)  Alcohol Screen: Low Risk  (10/22/2022)  Depression (PHQ2-9): Low Risk  (05/06/2023)  Financial Resource Strain: Low Risk  (10/22/2022)  Physical Activity: Sufficiently Active (10/22/2022)  Social Connections: Moderately Isolated (07/05/2023)  Stress: No Stress Concern Present (10/22/2022)  Tobacco Use: Low Risk  (07/04/2023)  Health Literacy: Adequate Health Literacy (03/04/2023)    Readmission Risk Interventions     No data to display

## 2023-07-08 NOTE — Progress Notes (Signed)
 Physical Therapy Treatment Patient Details Name: Carol [name] MRN: [id number] DOB: December 14, 1935 Today's Date: 07/08/2023   History of Present Illness Carol [name] is a 88 y.o. female admitted 1/9 who presents with syncope and fall.  Reports multiple falls over the last year.  PMH: CAD s/p stent and CABG, HFrEF, HTN, T2DM, asthma, hypothyroidism, CKD4    PT Comments  Pt reports she is feeling better. PT inquired about use of log rolling to get up to EoB given prior discomfort in her back. With HoB elevated pt pulls herself to EoB and reports she is fine. Pt reports need to go to bathroom and stands needing min A for steadying. Pt takes a step away from bed and recognizes that she was supposed to stand for beside bed to assess dizziness. Pt barely allows PT to place gait belt and she walks to bathroom. PT recommends use of RW, pt ambulates short distance with it and then parks it in hallway and continues without AD. Pt with mild instability but does not need outside assist to steady. Able to ascend/descend steps with rails at supervision level. Does not participate in DGI tasks as I don't do those things when I walk. PT would benefit from HHPT at discharge to work on balance in her own environment given she lives alone. PT will continue to follow acutely.    If plan is discharge home, recommend the following: A lot of help with walking and/or transfers;A little help with bathing/dressing/bathroom;Assistance with cooking/housework;Assist for transportation;Help with stairs or ramp for entrance   Can travel by private vehicle     No  Equipment Recommendations  Rolling walker (2 wheels)       Precautions / Restrictions Precautions Precautions: Fall Restrictions Weight Bearing Restrictions Per Provider Order: No     Mobility  Bed Mobility Overal bed mobility: Needs Assistance Bed Mobility: Supine to Sit     Supine to sit: Supervision     General bed mobility comments: asked about  rolling to get up to help with back pain, pt reports she is fine, proceeds to comes to EoB with HoB elevated and use of bed rail to pull up    Transfers Overall transfer level: Needs assistance Equipment used: Rolling walker (2 wheels) Transfers: Sit to/from Stand Sit to Stand: Min assist           General transfer comment: good power up but requires min A for steadying in standing, pt pauses for a moment but reports needing to go to bathroom and reports she is not dizzy. just able to get gait belt around her waist before she walks away from bed    Ambulation/Gait Ambulation/Gait assistance: Contact guard assist, Min assist Gait Distance (Feet): 250 Feet Assistive device: Rolling walker (2 wheels), None Gait Pattern/deviations: Step-through pattern, Drifts right/left Gait velocity: variable Gait velocity interpretation: <1.8 ft/sec, indicate of risk for recurrent falls   General Gait Details: request pt use RW initially with gait and pt able to ambulate with increased stability, pt reports she does not need the RW, ambulated in hall without AD and pt with drifting R and L, pt reports this is normal, attempted to perform DGI but pt resistent stating that she never looks R and L when she walks or drives, pt noted to have minor instability with headturns, progressed to looking up and down with ambulation and pt states I never do this and continues walking straight despite PT attempting to educate pt on dynamic balance   Stairs  Stairs: Yes Stairs assistance: Supervision Stair Management: Two rails Number of Stairs: 5 General stair comments: supervision for ascent/descent without assist          Balance Overall balance assessment: Needs assistance Sitting-balance support: No upper extremity supported, Feet supported Sitting balance-Leahy Scale: Fair     Standing balance support: Bilateral upper extremity supported, During functional activity Standing balance-Leahy Scale:  Fair                   Standardized Balance Assessment Standardized Balance Assessment : Dynamic Gait Index   Dynamic Gait Index Level Surface: Normal Gait with Horizontal Head Turns: Mild Impairment Steps: Mild Impairment      Cognition Arousal: Alert Behavior During Therapy: WFL for tasks assessed/performed Overall Cognitive Status: Within Functional Limits for tasks assessed                                             General Comments General comments (skin integrity, edema, etc.): ted hose donned, does not report dizziness      Pertinent Vitals/Pain Pain Assessment Pain Assessment: Faces Faces Pain Scale: Hurts a little bit Pain Location: back Pain Descriptors / Indicators: Aching, Discomfort, Grimacing, Guarding Pain Intervention(s): Limited activity within patient's tolerance, Monitored during session, Repositioned     PT Goals (current goals can now be found in the care plan section) Acute Rehab PT Goals Patient Stated Goal: to go home PT Goal Formulation: With patient Time For Goal Achievement: 07/19/23 Potential to Achieve Goals: Good Progress towards PT goals: Progressing toward goals    Frequency    Min 1X/week       AM-PAC PT 6 Clicks Mobility   Outcome Measure  Help needed turning from your back to your side while in a flat bed without using bedrails?: A Little Help needed moving from lying on your back to sitting on the side of a flat bed without using bedrails?: None Help needed moving to and from a bed to a chair (including a wheelchair)?: A Little Help needed standing up from a chair using your arms (e.g., wheelchair or bedside chair)?: A Little Help needed to walk in hospital room?: A Little Help needed climbing 3-5 steps with a railing? : A Little 6 Click Score: 19    End of Session Equipment Utilized During Treatment: Gait belt Activity Tolerance: Patient limited by fatigue Patient left: with call bell/phone  within reach;in bed Nurse Communication: Mobility status;Patient requests pain meds PT Visit Diagnosis: Unsteadiness on feet (R26.81);Muscle weakness (generalized) (M62.81);Pain Pain - part of body:  (back)     Time: 1005-1020 PT Time Calculation (min) (ACUTE ONLY): 15 min  Charges:    $Gait Training: 8-22 mins PT General Charges $$ ACUTE PT VISIT: 1 Visit                     Carol [name] PT, DPT Acute Rehabilitation Services Please use secure chat or  Call Office [phone]    Carol [name] 07/08/2023, 1:06 PM

## 2023-07-08 NOTE — TOC Transition Note (Signed)
 Transition of Care [facility]) - Discharge Note   Patient Details  Name: Carol [name] MRN: [id number] Date of Birth: Nov 19, 1935  [facility] CM/SW Contact:  Andrez JULIANNA George, RN Phone Number: 07/08/2023, 3:30 PM   Clinical Narrative:     Pt did better with PT today and they changed their recommendations to [facility]. Pt had no preference on home health agency. Home health arranged with [facility]. Information on the AVS. Hedda will contact her for the first home visit. Walker for home delivered to the room per [employer].  Pt has transportation home.  Final next level of care: Home w Home Health Services Barriers to Discharge: No Barriers Identified   Patient Goals and CMS Choice   CMS Medicare.gov Compare Post Acute Care list provided to:: Patient Choice offered to / list presented to : Patient [place] ownership interest in [facility].provided to:: Patient    Discharge Placement                       Discharge Plan and Services Additional resources added to the After Visit Summary for       Post Acute Care Choice: Skilled Nursing Facility          DME Arranged: Vannie rolling DME Agency: [employer] Date DME Agency Contacted: 07/08/23   Representative spoke with at DME Agency: dc lounge HH Arranged: PT, OT HH Agency: [facility] Date [facility] Agency Contacted: 07/08/23   Representative spoke with at [facility] Agency: Darleene  Social Drivers of Health (SDOH) Interventions SDOH Screenings   Food Insecurity: No Food Insecurity (07/05/2023)  Housing: Low Risk  (07/05/2023)  Transportation Needs: No Transportation Needs (07/05/2023)  Utilities: Not At Risk (07/05/2023)  Alcohol Screen: Low Risk  (10/22/2022)  Depression (PHQ2-9): Low Risk  (05/06/2023)  Financial Resource Strain: Low Risk  (10/22/2022)  Physical Activity: Sufficiently Active (10/22/2022)  Social Connections: Moderately Isolated (07/05/2023)  Stress: No Stress Concern Present (10/22/2022)   Tobacco Use: Low Risk  (07/04/2023)  Health Literacy: Adequate Health Literacy (03/04/2023)     Readmission Risk Interventions     No data to display

## 2023-07-08 NOTE — Progress Notes (Signed)
 Occupational Therapy Treatment Patient Details Name: Carol [name] MRN: [id number] DOB: June 03, 1936 Today's Date: 07/08/2023   History of present illness Carol [name] is a 88 y.o. female admitted 1/9 who presents with syncope and fall.  Reports multiple falls over the last year.  PMH: CAD s/p stent and CABG, HFrEF, HTN, T2DM, asthma, hypothyroidism, CKD4   OT comments         Pt is making fair progress towards their acute OT goals. Overall she completed bed mobility, transfers and short functional mobility without physical assist. Noted poor management of RW with pt parking the RW outside of the bathroom and not using it to mobilize back to bed. Pt with asymptomatic drop in BP, but adamant on ambulating to bathroom despite education on fall/fainting risk. OT to continue to follow acutely to facilitate progress towards established goals. Pt will continue to benefit from skilled inpatient follow up therapy, <3 hours/day - however, pt declining and has active d/c plans for home.   Orthostatic Bps: Supine 139/76 (95)  Sitting 128/67 (85)  Standing 81/50 (61)  *asymptomatic*  Standing after 2 min 79/54 (63)  *asymptomatic*         If plan is discharge home, recommend the following:  A little help with walking and/or transfers;A lot of help with bathing/dressing/bathroom;Assistance with cooking/housework;Direct supervision/assist for medications management;Direct supervision/assist for financial management;Assist for transportation;Help with stairs or ramp for entrance   Equipment Recommendations  Other (comment) (RW)       Precautions / Restrictions Precautions Precautions: Fall Precaution Comments: [place] Restrictions Weight Bearing Restrictions Per Provider Order: No       Mobility Bed Mobility Overal bed mobility: Needs Assistance Bed Mobility: Supine to Sit                Transfers Overall transfer level: Needs assistance Equipment used: Rolling walker (2  wheels) Transfers: Sit to/from Stand Sit to Stand: Supervision           General transfer comment: from bed and toilet     Balance Overall balance assessment: Needs assistance Sitting-balance support: No upper extremity supported, Feet supported Sitting balance-Leahy Scale: Fair     Standing balance support: Bilateral upper extremity supported, During functional activity Standing balance-Leahy Scale: Fair                             ADL either performed or assessed with clinical judgement   ADL Overall ADL's : Needs assistance/impaired                         Toilet Transfer: Supervision/safety;Ambulation;Rolling walker (2 wheels) Toilet Transfer Details (indicate cue type and reason): poor RW mgmt, parked it outside of the bathroom. did not remember to use RW on the wat back to bed Toileting- Clothing Manipulation and Hygiene: Independent;Sitting/lateral lean       Functional mobility during ADLs: Supervision/safety;Rolling walker (2 wheels) General ADL Comments: overall moving well, limited by [facility] and safety awanress    Extremity/Trunk Assessment Upper Extremity Assessment Upper Extremity Assessment: Generalized weakness   Lower Extremity Assessment Lower Extremity Assessment: Defer to PT evaluation        Vision   Vision Assessment?: No apparent visual deficits Additional Comments: wears glasses   Perception Perception Perception: Within Functional Limits   Praxis Praxis Praxis: WFL    Cognition Arousal: Alert Behavior During Therapy: WFL for tasks assessed/performed Overall Cognitive Status: Impaired/Different from baseline  Area of Impairment: Safety/judgement                         Safety/Judgement: Decreased awareness of deficits     General Comments: very limited insight to safety in regard to orthostatic hypotension and fall risk despite maximal education              General Comments ted hose still donned,  denied dizziness despite orthostatic drop in BP    Pertinent Vitals/ Pain       Pain Assessment Pain Assessment: No/denies pain   Frequency  Min 1X/week        Progress Toward Goals  OT Goals(current goals can now be found in the care plan section)  Progress towards OT goals: Progressing toward goals  Acute Rehab OT Goals Patient Stated Goal: home asao OT Goal Formulation: With patient Time For Goal Achievement: 07/19/23 Potential to Achieve Goals: Good ADL Goals Pt Will Perform Grooming: with supervision;standing Pt Will Perform Lower Body Dressing: with set-up;sit to/from stand Pt Will Transfer to Toilet: with supervision;ambulating Additional ADL Goal #1: Pt will tolerate at least 8 minutes of OOB activity to demonstrate improved endurance for discharge home   AM-PAC OT 6 Clicks Daily Activity     Outcome Measure   Help from another person eating meals?: None Help from another person taking care of personal grooming?: A Little Help from another person toileting, which includes using toliet, bedpan, or urinal?: A Little Help from another person bathing (including washing, rinsing, drying)?: A Little Help from another person to put on and taking off regular upper body clothing?: A Little Help from another person to put on and taking off regular lower body clothing?: A Little 6 Click Score: 19    End of Session    OT Visit Diagnosis: Unsteadiness on feet (R26.81);Other abnormalities of gait and mobility (R26.89);Muscle weakness (generalized) (M62.81);Pain   Activity Tolerance Patient tolerated treatment well   Patient Left in bed;with call bell/phone within reach;with bed alarm set   Nurse Communication Mobility status        Time: 8549-8487 OT Time Calculation (min): 22 min  Charges: OT General Charges $OT Visit: 1 Visit OT Treatments $Self Care/Home Management : 8-22 mins  Lucie Kendall, OTR/L Acute Rehabilitation Services Office  [phone] Secure Chat Communication Preferred   Lucie JONETTA Kendall 07/08/2023, 3:21 PM

## 2023-07-08 NOTE — Discharge Instructions (Addendum)

## 2023-07-08 NOTE — Discharge Summary (Signed)
 Physician Discharge Summary  Carol [name] FMW:[id number] DOB: 01-09-1936  PCP: Sherre Clapper, MD  Admitted from: Home Discharged to: Home  Admit date: 07/04/2023 Discharge date: 07/08/2023  Recommendations for Outpatient Follow-up:    Follow-up Information     [facility], [facility] Follow up.   Specialty: Home Health Services Why: The home health agency will contact you for the first home visit Contact information: [address] [place] [address] [phone]         Sherre Clapper, MD. Schedule an appointment as soon as possible for a visit in 1 week(s).   Specialty: Family Medicine Why: To be seen with repeat labs (CBC & BMP).  Recheck orthostatic vital signs during office visit. Contact information: [address] [place] [address] [phone]                  Home Health: Home Health Orders (From admission, onward)     Start     Ordered   07/08/23 1529  Home Health  At discharge       Question Answer Comment  To provide the following care/treatments PT   To provide the following care/treatments OT   To provide the following care/treatments Home Health Aide      07/08/23 1528             Equipment/Devices:     Durable Medical Equipment  (From admission, onward)           Start     Ordered   07/08/23 1345  For home use only DME Walker rolling  Once       Question Answer Comment  Walker: With 5 Inch Wheels   Patient needs a walker to treat with the following condition Weakness      07/08/23 1344             Discharge Condition: Improved and stable   Code Status: Limited: Do not attempt resuscitation (DNR) -DNR-LIMITED -Do Not Intubate/DNI  Diet recommendation:  Discharge Diet Orders (From admission, onward)     Start     Ordered   07/08/23 0000  Diet - low sodium heart healthy        07/08/23 1600   07/08/23 0000  Diet Carb Modified        07/08/23 1600             Discharge  Diagnoses:  Principal Problem:   Syncope Active Problems:   Acquired hypothyroidism   Hx of CABG   AKI (acute kidney injury) (HCC)   Type 2 diabetes mellitus with chronic kidney disease, with long-term current use of insulin  [facility])   Brief Hospital Course:  88 y.o. female with medical history significant of CAD s/p stent and CABG, HFrEF, HTN, T2DM, asthma, hypothyroidism, CKD4, hard of hearing who presents with syncope and fall.  Admitted for syncope, fall, orthostatic hypotension and acute on chronic kidney disease, suspected due to poor oral intake for several days PTA.  Ongoing orthostatic hypotension despite IVF hydration,?  Autonomic dysfunction.  Initiated midodrine .  Patient declining STR SNF for rehab and insists on returning home with help of friends to assist her.   PT continues to recommend SNF for STR.  TOC working on placement at [facility] in [place], reportedly have a bed, insurance to be initiated.     Assessment & Plan:    Syncope and fall/collapse Suspected due to orthostatic hypotension from dehydration due to poor oral intake for several  days PTA.  Unclear if orthostatic hypotension is also due to autonomic dysfunction. Clearly orthostatic on 1/10 and 1/11 Telemetry shows sinus rhythm with first-degree AV block and no arrhythmias.  Discontinued telemetry. 2D echo: LVEF 60-65% grade 1 diastolic dysfunction.  No aortic stenosis. HS Troponin negative. Treat orthostatic hypotension as below. Advised patient that she should not drive x 6 months.  She verbalized understanding. Extensive imaging (CT C/A/P, CT C and L-spine, CT head, x-ray pelvis) without acute findings. Therapies had evaluated and initially recommended SNF for STR.  Patient was not really interested in going to SNF and wanted to return to her home/apartment.  PT reevaluated her this afternoon and recommend home with home health services, arranged by TOC.   Orthostatic hypotension/dehydration Due to  several days history of nausea, dry heaves, poor oral intake due to feeling sick IV fluids, bilateral TED hose and monitor Significantly orthostatic early on in admission. Treated with a couple days of IV fluids but remained orthostatic-?  Autonomic dysfunction due to longstanding poorly controlled DM. Started low-dose midodrine  5 Mg twice daily.  Now appears to have some supine hypertension with ongoing orthostatic findings with SBP dropped from 168/64 supine to 129/82 standing.  Will cut back on midodrine  dose to 2.5 Mg 3 times daily.  Patient however appears to be less symptomatic or asymptomatic. Outpatient follow-up with PCP.   Acute kidney injury complicating stage IV CKD Most recent baseline creatinine of 1.78 on 12/27. Presented with creatinine of 2.25. AKI secondary to dehydration. Renal ultrasound: Bilateral mild renal atrophy. No collecting system dilatation. Poor visualization of the urinary bladder Despite almost 48 hours of IV fluid hydration, creatinine has plateaued in the low 2 range since admission.  Patient may have achieved a new baseline. Follow BMP periodically as outpatient. Reviewed med list with pharmacy and reduced dose of allopurinol .   Type 2 diabetes mellitus with chronic kidney disease, with long-term current use of insulin  (HCC) -Last A1c 7.9 on 12/27 suggesting poor outpatient control. -Continue prior home DM meds with close outpatient follow-up with PCP.   Essential hypertension: Controlled on carvedilol , Imdur .   Hx of CABG - Patient complaining of posterior neck pain which feels similar to her prior MI.  However, her pain was improved once her neck was adjusted with additional pillows suggesting more MSK in origin.  Her troponin and EKG also reassuring at this time. -Low suspicion of cardiogenic cause of her syncope.  -continue Plavix , Imdur , carvedilol  and Ranexa  -No further neck pain or angina.   Acquired hypothyroidism - Continue levothyroxine .  TSH  2.750 in August 2024   Asymptomatic bacteriuria Denies UTI symptoms.  Afebrile.  Transient leukocytosis improved without antibiotics.  >100 K colonies of E. coli on urine culture likely colonization.  Hold off antibiotics unless clinical UTI.   Low back pain Noted after fall. Imaging as noted above without acute findings Added lidocaine  patch, change acetaminophen  1 g 3 times daily while here. Added as needed low-dose tramadol -has not used any. States that after she did some stretches yesterday, her low back pain is significantly better.   Body mass index is 29.26 kg/m.     Consultants:       Procedures:       Discharge Instructions  Discharge Instructions     (HEART FAILURE PATIENTS) Call MD:  Anytime you have any of the following symptoms: 1) 3 pound weight gain in 24 hours or 5 pounds in 1 week 2) shortness of breath, with or without a dry  hacking cough 3) swelling in the hands, feet or stomach 4) if you have to sleep on extra pillows at night in order to breathe.   Complete by: As directed    Call MD for:   Complete by: As directed    Passing out or feeling like passing out.   Call MD for:  extreme fatigue   Complete by: As directed    Call MD for:  persistant dizziness or light-headedness   Complete by: As directed    Diet - low sodium heart healthy   Complete by: As directed    Diet Carb Modified   Complete by: As directed    Discharge instructions   Complete by: As directed    No driving for 6 months.   Increase activity slowly   Complete by: As directed         Medication List     STOP taking these medications    potassium chloride  10 MEQ tablet Commonly known as: KLOR-CON        TAKE these medications    acetaminophen  500 MG tablet Commonly known as: TYLENOL  Take 2 tablets (1,000 mg total) by mouth every 8 (eight) hours as needed for mild pain (pain score 1-3) or moderate pain (pain score 4-6).   albuterol  108 (90 Base) MCG/ACT  inhaler Commonly known as: VENTOLIN  HFA INHALE 2 PUFFS INTO THE LUNGS EVERY 6 (SIX) HOURS AS NEEDED FOR WHEEZING OR SHORTNESS OF BREATH.   Alcohol Wipes 70 % Pads USE ONE PAD TO CLEAN AREA TO CHECK BLOOD SUGAR AS  DIRECTED   allopurinol  300 MG tablet Commonly known as: ZYLOPRIM  Take 0.5 tablets (150 mg total) by mouth daily. What changed: See the new instructions.   Biotin  5000 MCG Caps Take 5,000 mcg by mouth every morning.   calcium  carbonate 1500 (600 Ca) MG Tabs tablet Commonly known as: OSCAL Take 600 mg by mouth daily with breakfast.   carvedilol  6.25 MG tablet Commonly known as: COREG  TAKE 1 TABLET TWICE DAILY WITH MEALS   clopidogrel  75 MG tablet Commonly known as: PLAVIX  TAKE 1 TABLET EVERY DAY   colchicine  0.6 MG tablet Take 1 tablet (0.6 mg total) by mouth 2 (two) times daily as needed (gout flare up.).   empagliflozin  25 MG Tabs tablet Commonly known as: Jardiance  Take 1 tablet (25 mg total) by mouth daily before breakfast.   ezetimibe  10 MG tablet Commonly known as: ZETIA  TAKE 1 TABLET EVERY DAY   famotidine  10 MG tablet Commonly known as: PEPCID  Take 10 mg by mouth 2 (two) times daily as needed for heartburn or indigestion.   fluticasone 50 MCG/ACT nasal spray Commonly known as: FLONASE Place 1 spray into both nostrils daily as needed for allergies.   FreeStyle [employer] 1 each by Does not apply route as directed.   furosemide  80 MG tablet Commonly known as: LASIX  Take 1 tablet (80 mg total) by mouth as needed for fluid.   gabapentin  300 MG capsule Commonly known as: NEURONTIN  Take 1 capsule (300 mg total) by mouth 2 (two) times daily.   insulin  NPH-regular Human (70-30) 100 UNIT/ML injection Inject 25-32 Units into the skin daily. Reports 20 units in morning and 10-12 at night if needed.   isosorbide  mononitrate 30 MG 24 hr tablet Commonly known as: IMDUR  Take 0.5 tablets (15 mg total) by mouth daily.   levothyroxine  50 MCG  tablet Commonly known as: SYNTHROID  Take 50 mcg by mouth every morning.   lidocaine  5 % Commonly  known as: LIDODERM  Place 1 patch onto the skin daily. Remove & Discard patch within 12 hours or as directed by MD.  Apply to low mid back at site of maximal pain. Start taking on: July 09, 2023   methocarbamol  500 MG tablet Commonly known as: ROBAXIN  Take 1 tablet (500 mg total) by mouth every 6 (six) hours as needed for muscle spasms.   midodrine  2.5 MG tablet Commonly known as: PROAMATINE  Take 1 tablet (2.5 mg total) by mouth 3 (three) times daily with meals.   nitroGLYCERIN  0.4 MG SL tablet Commonly known as: NITROSTAT  DISSOLVE 1 TABLET UNDER THE TONGUE EVERY 5 MINUTES AS NEEDED FOR CHEST PAIN UP TO 3 TIMES. CALL EMS IF YOU NEED A SECOND.   pantoprazole  40 MG tablet Commonly known as: PROTONIX  TAKE 1 TABLET EVERY DAY   ranolazine  500 MG 12 hr tablet Commonly known as: RANEXA  TAKE 1 TABLET TWICE DAILY   Tradjenta  5 MG Tabs tablet Generic drug: linagliptin  TAKE 1 TABLET EVERY DAY   True Metrix Blood Glucose Test test strip Generic drug: glucose blood Use as instructed   True Metrix Meter Devi Use to test blood sugars once daily. ICD: E11.65   TRUEplus Lancets 33G Misc Use to test blood sugar once daily. ICD: E11.65       Allergies  Allergen Reactions   Iodinated Contrast Media Anaphylaxis and Other (See Comments)    unknown Contrast agent Makes me shake all over   Atorvastatin  Other (See Comments)    Muscle aches- severe   Corticosteroids Other (See Comments)    Elevated blood sugar     Hydrocodone  Nausea And Vomiting   Other Nausea And Vomiting    Pain medications except can tramadol    Oxycodone  Nausea And Vomiting   Simvastatin Other (See Comments)    Muscle aches- severe   Cortizone-10 [Hydrocortisone] Other (See Comments)    unknown      Procedures/Studies: US  RENAL Result Date: 07/05/2023 CLINICAL DATA:  Acute kidney injury EXAM: RENAL /  URINARY TRACT ULTRASOUND COMPLETE COMPARISON:  Noncontrast CT 07/04/2023. FINDINGS: Right Kidney: Renal measurements: 9.4 x 4.3 x 4.0 cm = volume: 82.6 mL. Mild parenchymal atrophy. No collecting system dilatation or perinephric fluid. Left Kidney: Renal measurements: 8.4 x 3.1 x 5.2 cm = volume: 69.8 mL. Global parenchymal atrophy. No collecting system dilatation or perinephric fluid. Bladder: Poorly seen.  This could be collapsed. Other: None IMPRESSION: Bilateral mild renal atrophy. No collecting system dilatation. Poor visualization of the urinary bladder Electronically Signed   By: Ranell Bring M.D.   On: 07/05/2023 13:10   ECHOCARDIOGRAM COMPLETE Result Date: 07/05/2023    ECHOCARDIOGRAM REPORT   Patient Name:   Carol [name] Date of Exam: 07/05/2023 Medical Rec #:  [id number]        Height:       67.5 in Accession #:    [id number]       Weight:       187.6 lb Date of Birth:  [birth year]        BSA:          1.979 m Patient Age:    87 years         BP:           112/82 mmHg Patient Gender: F                HR:           70 bpm. Exam Location:  Inpatient Procedure: 2D Echo,  Color Doppler, Cardiac Doppler and Intracardiac            Opacification Agent Indications:    Stroke I63.9  History:        Patient has prior history of Echocardiogram examinations, most                 recent 01/16/2022.  Sonographer:    Tinnie Gosling RDCS Referring Phys: [id number] Lesslie Mossa D Lynanne Delgreco IMPRESSIONS  1. Left ventricular ejection fraction, by estimation, is 60 to 65%. The left ventricle has normal function. The left ventricle has no regional wall motion abnormalities. There is mild concentric left ventricular hypertrophy. Left ventricular diastolic parameters are consistent with Grade I diastolic dysfunction (impaired relaxation).  2. Right ventricular systolic function is normal. The right ventricular size is not well visualized.  3. Left atrial size was moderately dilated.  4. The mitral valve is normal in structure. Trivial  mitral valve regurgitation. No evidence of mitral stenosis.  5. The aortic valve is tricuspid. There is mild calcification of the aortic valve. Aortic valve regurgitation is trivial. Aortic valve sclerosis/calcification is present, without any evidence of aortic stenosis.  6. The inferior vena cava is normal in size with greater than 50% respiratory variability, suggesting right atrial pressure of 3 mmHg. FINDINGS  Left Ventricle: Left ventricular ejection fraction, by estimation, is 60 to 65%. The left ventricle has normal function. The left ventricle has no regional wall motion abnormalities. The left ventricular internal cavity size was normal in size. There is  mild concentric left ventricular hypertrophy. Left ventricular diastolic parameters are consistent with Grade I diastolic dysfunction (impaired relaxation). Right Ventricle: The right ventricular size is not well visualized. No increase in right ventricular wall thickness. Right ventricular systolic function is normal. Left Atrium: Left atrial size was moderately dilated. Right Atrium: Right atrial size was normal in size. Pericardium: There is no evidence of pericardial effusion. Mitral Valve: The mitral valve is normal in structure. Trivial mitral valve regurgitation. No evidence of mitral valve stenosis. Tricuspid Valve: The tricuspid valve is normal in structure. Tricuspid valve regurgitation is trivial. No evidence of tricuspid stenosis. Aortic Valve: The aortic valve is tricuspid. There is mild calcification of the aortic valve. Aortic valve regurgitation is trivial. Aortic valve sclerosis/calcification is present, without any evidence of aortic stenosis. Pulmonic Valve: The pulmonic valve was normal in structure. Pulmonic valve regurgitation is not visualized. No evidence of pulmonic stenosis. Aorta: The aortic root is normal in size and structure. Venous: The inferior vena cava is normal in size with greater than 50% respiratory variability,  suggesting right atrial pressure of 3 mmHg. IAS/Shunts: No atrial level shunt detected by color flow Doppler.  LEFT VENTRICLE PLAX 2D LVIDd:         4.70 cm   Diastology LVIDs:         2.80 cm   LV e' medial:    3.81 cm/s LV PW:         1.10 cm   LV E/e' medial:  21.5 LV IVS:        1.10 cm   LV e' lateral:   7.07 cm/s LVOT diam:     2.30 cm   LV E/e' lateral: 11.6 LV SV:         43 LV SV Index:   22 LVOT Area:     4.15 cm  LEFT ATRIUM           Index LA diam:      3.80 cm  1.92 cm/m LA Vol (A4C): 87.1 ml 44.01 ml/m  AORTIC VALVE LVOT Vmax:   57.50 cm/s LVOT Vmean:  37.000 cm/s LVOT VTI:    0.103 m  AORTA Ao Root diam: 2.90 cm Ao Asc diam:  3.30 cm MITRAL VALVE MV Area (PHT): 2.27 cm     SHUNTS MV E velocity: 82.00 cm/s   Systemic VTI:  0.10 m MV A velocity: 132.00 cm/s  Systemic Diam: 2.30 cm MV E/A ratio:  0.62 Toribio Fuel MD Electronically signed by Toribio Fuel MD Signature Date/Time: 07/05/2023/9:53:58 AM    Final    CT CHEST ABDOMEN PELVIS WO CONTRAST Result Date: 07/04/2023 CLINICAL DATA:  Syncope and fall. EXAM: CT CHEST, ABDOMEN AND PELVIS WITHOUT CONTRAST TECHNIQUE: Multidetector CT imaging of the chest, abdomen and pelvis was performed following the standard protocol without IV contrast. RADIATION DOSE REDUCTION: This exam was performed according to the departmental dose-optimization program which includes automated exposure control, adjustment of the mA and/or kV according to patient size and/or use of iterative reconstruction technique. COMPARISON:  February 24, 2014. FINDINGS: CT CHEST FINDINGS Cardiovascular: Status post coronary artery bypass graft. Atherosclerosis of thoracic aorta without aneurysm. Normal cardiac size. No pericardial effusion. Mediastinum/Nodes: Moderate size hiatal hernia. No adenopathy. Thyroid  gland is unremarkable. Lungs/Pleura: Lungs are clear. No pleural effusion or pneumothorax. Musculoskeletal: No chest wall mass or suspicious bone lesions identified. CT  ABDOMEN PELVIS FINDINGS Hepatobiliary: No focal liver abnormality is seen. Status post cholecystectomy. No biliary dilatation. Pancreas: Unremarkable. No pancreatic ductal dilatation or surrounding inflammatory changes. Spleen: Calcified splenic granulomas are noted. Adrenals/Urinary Tract: Adrenal glands are unremarkable. Kidneys are normal, without renal calculi, focal lesion, or hydronephrosis. Bladder is unremarkable. Stomach/Bowel: There is no evidence of bowel obstruction or inflammation. Sigmoid diverticulosis is noted without inflammation. Moderate sized epigastric ventral hernia is noted which contains a portion of the gastric body, but does not result in obstruction. Status post appendectomy. Vascular/Lymphatic: Aortic atherosclerosis. No enlarged abdominal or pelvic lymph nodes. Reproductive: Status post hysterectomy. No adnexal masses. Other: No abdominal wall hernia or abnormality. No abdominopelvic ascites. Musculoskeletal: No acute or significant osseous findings. IMPRESSION: No definite traumatic injury seen in the chest, abdomen or pelvis. Moderate size sliding-type hiatal hernia. Moderate size epigastric ventral hernia which contains a portion of the stomach, but does not result in obstruction. Sigmoid diverticulosis without inflammation. Status post coronary artery bypass graft. Aortic Atherosclerosis (ICD10-I70.0). Electronically Signed   By: Lynwood Landy Raddle M.D.   On: 07/04/2023 16:02   CT T-SPINE NO CHARGE Result Date: 07/04/2023 CLINICAL DATA:  Syncope and fall EXAM: CT THORACIC SPINE WITHOUT CONTRAST TECHNIQUE: Multidetector CT images of the thoracic were obtained using the standard protocol without intravenous contrast. RADIATION DOSE REDUCTION: This exam was performed according to the departmental dose-optimization program which includes automated exposure control, adjustment of the mA and/or kV according to patient size and/or use of iterative reconstruction technique. COMPARISON:  No  prior CT of the thoracic spine available, correlation is made with 02/24/2014 CT chest FINDINGS: Alignment: Mild dextrocurvature of the midthoracic spine and levocurvature of the thoracolumbar junction. Exaggeration of the normal thoracic kyphosis. No listhesis. Vertebrae: No acute fracture or focal pathologic process. Paraspinal and other soft tissues: Please see same-day CT chest abdomen pelvis. Disc levels: Mild degenerative changes with some disc height loss but no significant spinal canal stenosis. IMPRESSION: 1. No acute fracture or traumatic listhesis. 2. Mild degenerative changes with some disc height loss but no significant spinal canal stenosis. Electronically Signed  By: Donald Campion M.D.   On: 07/04/2023 16:01   CT HEAD WO CONTRAST ( ) Result Date: 07/04/2023 CLINICAL DATA:  Syncope and fall, hit her head, on blood thinners EXAM: CT HEAD WITHOUT CONTRAST CT CERVICAL SPINE WITHOUT CONTRAST TECHNIQUE: Multidetector CT imaging of the head and cervical spine was performed following the standard protocol without intravenous contrast. Multiplanar CT image reconstructions of the cervical spine were also generated. RADIATION DOSE REDUCTION: This exam was performed according to the departmental dose-optimization program which includes automated exposure control, adjustment of the mA and/or kV according to patient size and/or use of iterative reconstruction technique. COMPARISON:  08/19/2014 CT head, no prior CT cervical spine available FINDINGS: CT HEAD FINDINGS Brain: No evidence of acute infarct, hemorrhage, mass, mass effect, or midline shift. No hydrocephalus or extra-axial fluid collection. 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 Cerebral volume is within normal limits for age. Basal ganglia calcifications. Vascular: No hyperdense vessel. Skull: Negative for fracture or focal lesion. Sinuses/Orbits: Chronic bilateral maxillary sinusitis, with near complete opacification of the  left maxillary sinus. Minimal mucosal thickening in the anterior ethmoid air cells. Status post bilateral lens replacements. Other: The mastoid air cells are well aerated. CT CERVICAL SPINE FINDINGS Alignment: No traumatic listhesis. Skull base and vertebrae: No acute fracture or suspicious osseous lesion. Soft tissues and spinal canal: No prevertebral fluid or swelling. No visible canal hematoma. Disc levels: Degenerative changes in the cervical spine.No high-grade spinal canal stenosis. Upper chest: For findings in the thorax, please see same day CT chest. IMPRESSION: 1. No acute intracranial process. 2. No acute fracture or traumatic listhesis in the cervical spine. Electronically Signed   By: Donald Campion M.D.   On: 07/04/2023 15:59   CT CERVICAL SPINE WO CONTRAST Result Date: 07/04/2023 CLINICAL DATA:  Syncope and fall, hit her head, on blood thinners EXAM: CT HEAD WITHOUT CONTRAST CT CERVICAL SPINE WITHOUT CONTRAST TECHNIQUE: Multidetector CT imaging of the head and cervical spine was performed following the standard protocol without intravenous contrast. Multiplanar CT image reconstructions of the cervical spine were also generated. RADIATION DOSE REDUCTION: This exam was performed according to the departmental dose-optimization program which includes automated exposure control, adjustment of the mA and/or kV according to patient size and/or use of iterative reconstruction technique. COMPARISON:  08/19/2014 CT head, no prior CT cervical spine available FINDINGS: CT HEAD FINDINGS Brain: No evidence of acute infarct, hemorrhage, mass, mass effect, or midline shift. No hydrocephalus or extra-axial fluid collection. 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 Cerebral volume is within normal limits for age. Basal ganglia calcifications. Vascular: No hyperdense vessel. Skull: Negative for fracture or focal lesion. Sinuses/Orbits: Chronic bilateral maxillary sinusitis, with near complete  opacification of the left maxillary sinus. Minimal mucosal thickening in the anterior ethmoid air cells. Status post bilateral lens replacements. Other: The mastoid air cells are well aerated. CT CERVICAL SPINE FINDINGS Alignment: No traumatic listhesis. Skull base and vertebrae: No acute fracture or suspicious osseous lesion. Soft tissues and spinal canal: No prevertebral fluid or swelling. No visible canal hematoma. Disc levels: Degenerative changes in the cervical spine.No high-grade spinal canal stenosis. Upper chest: For findings in the thorax, please see same day CT chest. IMPRESSION: 1. No acute intracranial process. 2. No acute fracture or traumatic listhesis in the cervical spine. Electronically Signed   By: Donald Campion M.D.   On: 07/04/2023 15:59   DG Pelvis Portable Result Date: 07/04/2023 CLINICAL DATA:  Trauma.  Pelvic pain. EXAM: PORTABLE PELVIS 1-2 VIEWS COMPARISON:  None Available. FINDINGS: Pelvis is intact with normal and symmetric sacroiliac joints. No acute fracture or dislocation. No aggressive osseous lesion. Visualized sacral arcuate lines are unremarkable. Unremarkable symphysis pubis. There are mild degenerative changes of bilateral hip joints without characterized by mild joint space narrowing and osteophytosis of the superior acetabulum. No radiopaque foreign bodies. IMPRESSION: No acute osseous abnormality of the pelvis. Electronically Signed   By: Ree Molt M.D.   On: 07/04/2023 15:23   DG CHEST PORT 1 VIEW Result Date: 07/04/2023 CLINICAL DATA:  Low back pain.  Trauma.  Fall. EXAM: PORTABLE CHEST 1 VIEW COMPARISON:  04/24/2017. FINDINGS: Bilateral lung fields are clear. Bilateral costophrenic angles are clear. Normal cardio-mediastinal silhouette. There are surgical staples along the heart border and sternotomy wires, status post CABG (coronary artery bypass graft). No acute osseous abnormalities. The soft tissues are within normal limits. IMPRESSION: No active disease.  Electronically Signed   By: Ree Molt M.D.   On: 07/04/2023 15:23      Subjective: States that after doing some low back stretches that were advised to her husband, her low back pain is much better and she ambulated in the hall with PT this morning.  Ambulated 250 feet with PT.   Discharge Exam:  Vitals:   07/08/23 0454 07/08/23 0731 07/08/23 1158 07/08/23 1533  BP:  (!) 157/61 (!) 149/50 (!) 159/56  Pulse:  (!) 53 (!) 51 (!) 51  Resp:  14 16 16   Temp:  98.2 F (36.8 C) 98 F (36.7 C) 98.1 F (36.7 C)  TempSrc:  Oral Oral Oral  SpO2:  95% 97% 96%  Weight: 86 kg     Height:        General exam: Elderly female, moderately built and frail sitting up comfortably in reclining chair without distress.  Oral mucosa moist.  Appears to be in good spirits. Respiratory system: Clear to auscultation.  No creased work of breathing Cardiovascular system: S1 & S2 heard, RRR. No JVD, murmurs, rubs, gallops or clicks. No pedal edema.  Off of telemetry. Gastrointestinal system: Abdomen is nondistended, soft and nontender. No organomegaly or masses felt. Normal bowel sounds heard. Central nervous system: Alert and oriented. No focal neurological deficits. Extremities: Symmetric 5 x 5 power.  Some bruising of her upper extremities. Skin: No rashes, lesions or ulcers Psychiatry: Judgement and insight appear normal. Mood & affect appropriate. Musculoskeletal: No deformity or tenderness across lower back.      The results of significant diagnostics from this hospitalization (including imaging, microbiology, ancillary and laboratory) are listed below for reference.     Microbiology: Recent Results (from the past 240 hours)  Resp panel by RT-PCR (RSV, Flu A&B, Covid) Urine, Clean Catch     Status: None   Collection Time: 07/04/23  8:43 PM   Specimen: Urine, Clean Catch; Nasal Swab  Result Value Ref Range Status   SARS Coronavirus 2 by RT PCR NEGATIVE NEGATIVE Final   Influenza A by PCR  NEGATIVE NEGATIVE Final   Influenza B by PCR NEGATIVE NEGATIVE Final    Comment: (NOTE) The Xpert Xpress SARS-CoV-2/FLU/RSV plus assay is intended as an aid in the diagnosis of influenza from Nasopharyngeal swab specimens and should not be used as a sole basis for treatment. Nasal washings and aspirates are unacceptable for Xpert Xpress SARS-CoV-2/FLU/RSV testing.  Fact Sheet for Patients: bloggercourse.com  Fact Sheet for Healthcare Providers: seriousbroker.it  This test is not yet approved or  cleared by the [place]  FDA and has been authorized for detection and/or diagnosis of SARS-CoV-2 by FDA under an Emergency Use Authorization (EUA). This EUA will remain in effect (meaning this test can be used) for the duration of the COVID-19 declaration under Section 564(b)(1) of the Act, 21 U.S.C. section 360bbb-3(b)(1), unless the authorization is terminated or revoked.     Resp Syncytial Virus by PCR NEGATIVE NEGATIVE Final    Comment: (NOTE) Fact Sheet for Patients: bloggercourse.com  Fact Sheet for Healthcare Providers: seriousbroker.it  This test is not yet approved or cleared by the [place]  FDA and has been authorized for detection and/or diagnosis of SARS-CoV-2 by FDA under an Emergency Use Authorization (EUA). This EUA will remain in effect (meaning this test can be used) for the duration of the COVID-19 declaration under Section 564(b)(1) of the Act, 21 U.S.C. section 360bbb-3(b)(1), unless the authorization is terminated or revoked.  Performed at [facility], [address] [place], [place] [address]   Urine Culture     Status: Abnormal   Collection Time: 07/04/23  8:43 PM   Specimen: Urine, Random  Result Value Ref Range Status   Specimen Description URINE, RANDOM  Final   Special Requests   Final    NONE Reflexed from [id number] Performed at [facility], [address] [place], [place] [address]    Culture (A)  Final    >=100,000 COLONIES/mL ESCHERICHIA COLI 90,000 COLONIES/mL ESCHERICHIA COLI    Report Status 07/07/2023 FINAL  Final   Organism ID, Bacteria ESCHERICHIA COLI (A)  Final   Organism ID, Bacteria ESCHERICHIA COLI (A)  Final      Susceptibility   Escherichia coli - MIC*    AMPICILLIN >=32 RESISTANT Resistant     CEFAZOLIN  <=4 SENSITIVE Sensitive     CEFEPIME <=0.12 SENSITIVE Sensitive     CEFTRIAXONE  <=0.25 SENSITIVE Sensitive     CIPROFLOXACIN  <=0.25 SENSITIVE Sensitive     GENTAMICIN <=1 SENSITIVE Sensitive     IMIPENEM <=0.25 SENSITIVE Sensitive     NITROFURANTOIN <=16 SENSITIVE Sensitive     TRIMETH /SULFA  >=320 RESISTANT Resistant     AMPICILLIN/SULBACTAM >=32 RESISTANT Resistant     PIP/TAZO 64 INTERMEDIATE Intermediate ug/mL   Escherichia coli - MIC*    AMPICILLIN >=32 RESISTANT Resistant     CEFAZOLIN  <=4 SENSITIVE Sensitive     CEFEPIME <=0.12 SENSITIVE Sensitive     CEFTRIAXONE  <=0.25 SENSITIVE Sensitive     CIPROFLOXACIN  <=0.25 SENSITIVE Sensitive     GENTAMICIN <=1 SENSITIVE Sensitive     IMIPENEM 0.5 SENSITIVE Sensitive     NITROFURANTOIN <=16 SENSITIVE Sensitive     TRIMETH /SULFA  >=320 RESISTANT Resistant     AMPICILLIN/SULBACTAM 8 SENSITIVE Sensitive     PIP/TAZO <=4 SENSITIVE Sensitive ug/mL    * >=100,000 COLONIES/mL ESCHERICHIA COLI    90,000 COLONIES/mL ESCHERICHIA COLI     Labs: CBC: Recent Labs  Lab 07/04/23 1503 07/05/23 0730  WBC 17.4* 11.3*  NEUTROABS 15.0*  --   HGB 13.5 12.4  HCT 41.3 37.4  MCV 96.3 96.1  PLT 265 220    Basic Metabolic Panel: Recent Labs  Lab 07/04/23 1503 07/05/23 0730 07/06/23 0604 07/07/23 0629 07/08/23 0529  NA 138 137 137 136 136  K 4.6 4.6 4.7 4.6 4.3  CL 97* 96* 99 102 99  CO2 27 26 27 25 24   GLUCOSE 146* 230* 148* 177* 156*  BUN 51* 51* 49* 46* 46*  CREATININE 2.25* 2.21* 2.19* 2.05*  2.18*  CALCIUM  10.0 9.4 9.6 9.1 9.1     Liver Function Tests: Recent Labs  Lab 07/04/23 1503  AST 28  ALT 18  ALKPHOS 126  BILITOT 0.9  PROT 7.3  ALBUMIN  4.0    CBG: Recent Labs  Lab 07/07/23 1201 07/07/23 1636 07/07/23 2140 07/08/23 0723 07/08/23 1156  GLUCAP 219* 178* 151* 174* 275*    Urinalysis    Component Value Date/Time   COLORURINE YELLOW 07/04/2023 2043   APPEARANCEUR HAZY (A) 07/04/2023 2043   LABSPEC 1.008 07/04/2023 2043   PHURINE 5.0 07/04/2023 2043   GLUCOSEU >=500 (A) 07/04/2023 2043   HGBUR NEGATIVE 07/04/2023 2043   BILIRUBINUR NEGATIVE 07/04/2023 2043   BILIRUBINUR negative 06/21/2023 1159   KETONESUR NEGATIVE 07/04/2023 2043   PROTEINUR NEGATIVE 07/04/2023 2043   UROBILINOGEN 0.2 06/21/2023 1159   UROBILINOGEN 0.2 04/22/2015 0006   NITRITE NEGATIVE 07/04/2023 2043   LEUKOCYTESUR LARGE (A) 07/04/2023 2043   Discussed in detail with patient's son via phone, updated care and answered all questions.  Informed him that patient must not drive x 6 months.  Advised him that one of her kids should go with her to the next PCPs appointment to review meds, recommendations etc.  She verbalized understanding.  He indicated that patient is very stubborn and does not listen to his advice.   Time coordinating discharge: 40 minutes  SIGNED:  Trenda Mar, MD,  FACP, [facility], [facility], [facility]   [facility] [place]     To contact the attending provider between 7A-7P or the covering provider during after hours 7P-7A, please log into the web site [email or web address] and access using universal [place] password for that web site. If you do not have the password, please call the hospital operator.

## 2023-07-10 ENCOUNTER — Telehealth: Payer: Self-pay

## 2023-07-10 NOTE — Transitions of Care (Post Inpatient/ED Visit) (Signed)
   07/10/2023  Name: Carol [name] MRN: [id number] DOB: 1936-01-26  Today's TOC FU Call Status: Today's TOC FU Call Status:: Unsuccessful Call (1st Attempt) Unsuccessful Call (1st Attempt) Date: 07/10/23  Attempted to reach the patient regarding the most recent Inpatient/ED visit.  Follow Up Plan: Additional outreach attempts will be made to reach the patient to complete the Transitions of Care (Post Inpatient/ED visit) call.   Signature: Creola Corn, LPN

## 2023-07-11 ENCOUNTER — Telehealth: Payer: Self-pay

## 2023-07-11 DIAGNOSIS — E860 Dehydration: Secondary | ICD-10-CM | POA: Diagnosis not present

## 2023-07-11 DIAGNOSIS — I951 Orthostatic hypotension: Secondary | ICD-10-CM | POA: Diagnosis not present

## 2023-07-11 DIAGNOSIS — D631 Anemia in chronic kidney disease: Secondary | ICD-10-CM | POA: Diagnosis not present

## 2023-07-11 DIAGNOSIS — N184 Chronic kidney disease, stage 4 (severe): Secondary | ICD-10-CM | POA: Diagnosis not present

## 2023-07-11 DIAGNOSIS — I130 Hypertensive heart and chronic kidney disease with heart failure and stage 1 through stage 4 chronic kidney disease, or unspecified chronic kidney disease: Secondary | ICD-10-CM | POA: Diagnosis not present

## 2023-07-11 DIAGNOSIS — I5022 Chronic systolic (congestive) heart failure: Secondary | ICD-10-CM | POA: Diagnosis not present

## 2023-07-11 DIAGNOSIS — E039 Hypothyroidism, unspecified: Secondary | ICD-10-CM | POA: Diagnosis not present

## 2023-07-11 DIAGNOSIS — E1122 Type 2 diabetes mellitus with diabetic chronic kidney disease: Secondary | ICD-10-CM | POA: Diagnosis not present

## 2023-07-11 DIAGNOSIS — N179 Acute kidney failure, unspecified: Secondary | ICD-10-CM | POA: Diagnosis not present

## 2023-07-11 NOTE — Telephone Encounter (Signed)
Please advise on medication interactions

## 2023-07-11 NOTE — Transitions of Care (Post Inpatient/ED Visit) (Signed)
   07/11/2023  Name: Carol [name] MRN: [id number] DOB: [birth year]  Today's TOC FU Call Status: Today's TOC FU Call Status:: Unsuccessful Call (2nd Attempt) Unsuccessful Call (1st Attempt) Date: 07/10/23 Unsuccessful Call (2nd Attempt) Date: 07/11/23  Attempted to reach the patient regarding the most recent Inpatient/ED visit.  Follow Up Plan: Additional outreach attempts will be made to reach the patient to complete the Transitions of Care (Post Inpatient/ED visit) call.   Signature Jacklynn Bue, LPN  78/29/56 2:13 AM

## 2023-07-11 NOTE — Telephone Encounter (Signed)
Copied from CRM [id number]. Topic: Clinical - Home Health Verbal Orders >> Jul 11, 2023  3:42 PM Antony Haste wrote: Caller/Agency: Lenetta Quaker (Physical Therapist) Callback Number: [phone] Service Requested: Physical Therapy Frequency: 1 week for 5, 1x a week for 4 weeks Any new concerns about the patient? Yes, Potential level 2 meds interactions:  carvedilol (COREG) 6.25 MG tablet interacts with colchicine 0.6 MG tablet carvedilol (COREG) 6.25 MG tablet interacts with Ventolin HFA colchicine 0.6 MG tablet interacts with  ranolazine (RANEXA) 500 MG 12 hr tablet Tanner states his system flagged these as level 2 interactions.

## 2023-07-11 NOTE — Telephone Encounter (Signed)
Patient only takes colchicine as needed for gout flare. No medication changes recommended.  We have been trying to reach her to do a TOC and to get her an appointment. Please try the patient again and if unable to reach, call Tanner also.  Dr. Sedalia Muta

## 2023-07-12 NOTE — Transitions of Care (Post Inpatient/ED Visit) (Signed)
   07/12/2023  Name: Carol [name] MRN: [id number] DOB: 22-Jun-1936  Today's TOC FU Call Status: Today's TOC FU Call Status:: Unsuccessful Call (3rd Attempt) Unsuccessful Call (1st Attempt) Date: 07/10/23 Unsuccessful Call (2nd Attempt) Date: 07/11/23 Unsuccessful Call (3rd Attempt) Date: 07/12/23  Attempted to reach the patient regarding the most recent Inpatient/ED visit.  Follow Up Plan: No further outreach attempts will be made at this time. We have been unable to contact the patient.  MyChart message sent to call the office to schedule ASAP.  Signature Creola Corn, LPN  78/29/56 21:30 AM

## 2023-07-15 NOTE — Telephone Encounter (Signed)
Hospital follow up schedule for Friday at 10 AM with Dr. Faylene Kurtz.

## 2023-07-17 DIAGNOSIS — I951 Orthostatic hypotension: Secondary | ICD-10-CM | POA: Diagnosis not present

## 2023-07-17 DIAGNOSIS — D631 Anemia in chronic kidney disease: Secondary | ICD-10-CM | POA: Diagnosis not present

## 2023-07-17 DIAGNOSIS — N179 Acute kidney failure, unspecified: Secondary | ICD-10-CM | POA: Diagnosis not present

## 2023-07-17 DIAGNOSIS — E039 Hypothyroidism, unspecified: Secondary | ICD-10-CM | POA: Diagnosis not present

## 2023-07-17 DIAGNOSIS — I130 Hypertensive heart and chronic kidney disease with heart failure and stage 1 through stage 4 chronic kidney disease, or unspecified chronic kidney disease: Secondary | ICD-10-CM | POA: Diagnosis not present

## 2023-07-17 DIAGNOSIS — I5022 Chronic systolic (congestive) heart failure: Secondary | ICD-10-CM | POA: Diagnosis not present

## 2023-07-17 DIAGNOSIS — E1122 Type 2 diabetes mellitus with diabetic chronic kidney disease: Secondary | ICD-10-CM | POA: Diagnosis not present

## 2023-07-17 DIAGNOSIS — E860 Dehydration: Secondary | ICD-10-CM | POA: Diagnosis not present

## 2023-07-17 DIAGNOSIS — N184 Chronic kidney disease, stage 4 (severe): Secondary | ICD-10-CM | POA: Diagnosis not present

## 2023-07-18 ENCOUNTER — Telehealth: Payer: Self-pay

## 2023-07-18 DIAGNOSIS — D631 Anemia in chronic kidney disease: Secondary | ICD-10-CM | POA: Diagnosis not present

## 2023-07-18 DIAGNOSIS — E1122 Type 2 diabetes mellitus with diabetic chronic kidney disease: Secondary | ICD-10-CM | POA: Diagnosis not present

## 2023-07-18 DIAGNOSIS — I951 Orthostatic hypotension: Secondary | ICD-10-CM | POA: Diagnosis not present

## 2023-07-18 DIAGNOSIS — E860 Dehydration: Secondary | ICD-10-CM | POA: Diagnosis not present

## 2023-07-18 DIAGNOSIS — I130 Hypertensive heart and chronic kidney disease with heart failure and stage 1 through stage 4 chronic kidney disease, or unspecified chronic kidney disease: Secondary | ICD-10-CM | POA: Diagnosis not present

## 2023-07-18 DIAGNOSIS — N184 Chronic kidney disease, stage 4 (severe): Secondary | ICD-10-CM | POA: Diagnosis not present

## 2023-07-18 DIAGNOSIS — E039 Hypothyroidism, unspecified: Secondary | ICD-10-CM | POA: Diagnosis not present

## 2023-07-18 DIAGNOSIS — N179 Acute kidney failure, unspecified: Secondary | ICD-10-CM | POA: Diagnosis not present

## 2023-07-18 DIAGNOSIS — I5022 Chronic systolic (congestive) heart failure: Secondary | ICD-10-CM | POA: Diagnosis not present

## 2023-07-18 NOTE — Telephone Encounter (Signed)
[facility]- HH CERT/POC Order number: [id number]

## 2023-07-19 ENCOUNTER — Ambulatory Visit (INDEPENDENT_AMBULATORY_CARE_PROVIDER_SITE_OTHER): Payer: Medicare HMO

## 2023-07-19 VITALS — BP 116/62 | HR 62 | Temp 97.3°F | Ht 67.0 in | Wt 184.0 lb

## 2023-07-19 DIAGNOSIS — I951 Orthostatic hypotension: Secondary | ICD-10-CM | POA: Insufficient documentation

## 2023-07-19 DIAGNOSIS — Z09 Encounter for follow-up examination after completed treatment for conditions other than malignant neoplasm: Secondary | ICD-10-CM | POA: Insufficient documentation

## 2023-07-19 DIAGNOSIS — M545 Low back pain, unspecified: Secondary | ICD-10-CM | POA: Diagnosis not present

## 2023-07-19 MED ORDER — METHOCARBAMOL 500 MG PO TABS: 500.0000 mg | ORAL_TABLET | Freq: Three times a day (TID) | ORAL | 0 refills | Status: AC | PRN

## 2023-07-19 MED ORDER — TRAMADOL HCL 50 MG PO TABS: 50.0000 mg | ORAL_TABLET | Freq: Every day | ORAL | 0 refills | Status: DC | PRN

## 2023-07-19 NOTE — Assessment & Plan Note (Signed)
Lives alone and has been advised to avoid bending over to prevent falls. Uses a walker and has a chair in the kitchen for safety. Discussed importance of safe practices and adequate hydration and nutrition. - Encourage safe practices to prevent falls - Ensure adequate hydration and nutrition - Continue using assistive devices as needed - continue midodrine and to monitor home blood pressures.  Follow-up - Follow up with Dr. Sedalia Muta on April 7.

## 2023-07-19 NOTE — Patient Instructions (Signed)
VISIT SUMMARY:  During your visit, we discussed your recent syncopal episode, ongoing back pain, and management of your chronic conditions including hypertension and diabetes. We reviewed your hospital stay and the treatments you received, and we have updated your care plan to address your current symptoms and prevent future issues.  YOUR PLAN:  -VASOVAGAL SYNCOPE: Vasovagal syncope is a sudden drop in heart rate and blood pressure leading to fainting, often in reaction to a stressful trigger. You experienced this on January 9, likely due to dehydration. We will continue midodrine 2.5 mg three times daily to help manage your blood pressure. Please monitor your blood pressure regularly and reduce the dose to once daily if it increases, or hold the medication if it remains elevated.  -ACUTE KIDNEY INJURY: Acute kidney injury is a sudden episode of kidney failure or damage, often caused by dehydration. You received IV fluids in the hospital, which improved your condition. It is important to stay well-hydrated to prevent this from happening again. Please drink plenty of fluids and monitor for any signs of dehydration.  -CHRONIC LOW BACK PAIN: Chronic low back pain is long-lasting pain in the lower back. Your recent fall has worsened this pain, especially in the right low back and flank. We will prescribe tramadol 50 mg once daily in the morning (30 tablets with no refills) and refill your muscle relaxant Robaxin, which you can take up to three times daily. Continue using a heating pad and lidocaine patches as needed, and keep up with your physical therapy exercises and stretches. Using a walker for support is also recommended to prevent falls.  -DIABETES MELLITUS: Diabetes mellitus is a condition that affects how your body processes blood sugar. Your recent HbA1c level is 7.9%, which is slightly elevated. Please continue to monitor your blood glucose levels and follow your current diabetes management  plan.  -GENERAL HEALTH MAINTENANCE: It is important to practice safe habits to prevent falls, especially since you live alone. Use your walker and the chair in your kitchen for safety. Ensure you stay hydrated and maintain good nutrition. Continue using assistive devices as needed.  INSTRUCTIONS:  Please follow up with Dr. Sedalia Muta on April 7.

## 2023-07-19 NOTE — Progress Notes (Signed)
Subjective:  Patient ID: Carol [name], female    DOB: [birth year]  Age: 88 y.o. MRN: [id number]  Chief Complaint  Patient presents with   Hospitalization Follow-up    HPI  The patient, with a history of hypertension, diabetes, and recent hospital admission for syncope, presents with persistent pain. The patient reported a syncopal episode on January 9th, during which she fell and sustained head injuries. Prior to the episode, she experienced several days of malaise, reduced oral intake, neck and shoulder pain, and dry heaving. The patient woke up on the kitchen floor with two large bumps on her head, presumably from hitting the door knobs.  In the hospital, the patient was found to have orthostatic hypotension and acute kidney injury, likely secondary to dehydration. The patient received four bags of IV fluids and was discharged with no evidence of fractures or cardiac events on imaging and lab work.  Currently, the patient reports severe back pain, which she describes as different from her chronic lower back pain post-surgery. The pain is localized to the right flank and lower back, but does not radiate to the leg. The patient uses a walker for mobility due to the pain. She has been taking Tylenol for pain management, but reports it as ineffective. She has previously taken Tramadol, which she found helpful, and Gabapentin for neuropathy.  The patient also reports using a heating pad for pain relief, which she finds more effective than lidocaine patches. She has been prescribed a muscle relaxant, Robaxin, but has not yet taken it. The patient lives alone and has home therapy services. She has been advised not to bend over due to her recent syncopal episode.   Patient states she would like an rx for tramadol for the back pain. She states the PT told her that she is a level 3 and needs medication.      07/19/2023    9:54 AM 05/06/2023    2:23 PM 02/18/2023   10:56 AM 10/22/2022    2:45 PM  07/16/2022    9:59 AM  Depression screen PHQ 2/9  Decreased Interest 0 0 0 0 0  Down, Depressed, Hopeless 0 0 0 0 0  PHQ - 2 Score 0 0 0 0 0  Altered sleeping   0    Tired, decreased energy   0    Change in appetite   0    Feeling bad or failure about yourself    0    Trouble concentrating   0    Moving slowly or fidgety/restless   0    Suicidal thoughts   0    PHQ-9 Score   0    Difficult doing work/chores   Not difficult at all          07/19/2023    9:54 AM  Fall Risk   Falls in the past year? 0  Number falls in past yr: 0  Injury with Fall? 0  Risk for fall due to : No Fall Risks    Patient Care Team: Blane Ohara, MD as PCP - General (Internal Medicine) Baldo Daub, MD as Consulting Physician (Cardiology) Regan Lemming, MD as Consulting Physician (Cardiology) [facility]   Review of Systems  Constitutional:  Negative for chills, fatigue and fever.  HENT:  Negative for congestion, ear pain and sore throat.   Respiratory:  Negative for cough and shortness of breath.   Cardiovascular:  Negative for chest pain.  Gastrointestinal:  Negative for abdominal  pain, constipation, diarrhea, nausea and vomiting.  Genitourinary:  Negative for dysuria and frequency.  Musculoskeletal:  Negative for arthralgias and myalgias.  Neurological:  Positive for dizziness, syncope and headaches.  Psychiatric/Behavioral:  Negative for dysphoric mood. The patient is not nervous/anxious.     Current Outpatient Medications on File Prior to Visit  Medication Sig Dispense Refill   acetaminophen (TYLENOL) 500 MG tablet Take 2 tablets (1,000 mg total) by mouth every 8 (eight) hours as needed for mild pain (pain score 1-3) or moderate pain (pain score 4-6).     albuterol (VENTOLIN HFA) 108 (90 Base) MCG/ACT inhaler INHALE 2 PUFFS INTO THE LUNGS EVERY 6 (SIX) HOURS AS NEEDED FOR WHEEZING OR SHORTNESS OF BREATH. 4 each 3   Alcohol Swabs (ALCOHOL WIPES) 70 % PADS USE ONE PAD TO  CLEAN AREA TO CHECK BLOOD SUGAR AS  DIRECTED     allopurinol (ZYLOPRIM) 300 MG tablet Take 0.5 tablets (150 mg total) by mouth daily.     Biotin 5000 MCG CAPS Take 5,000 mcg by mouth every morning.     Blood Glucose Monitoring Suppl (TRUE METRIX METER) DEVI Use to test blood sugars once daily. ICD: E11.65     calcium carbonate (OSCAL) 1500 (600 Ca) MG TABS tablet Take 600 mg by mouth daily with breakfast.     carvedilol (COREG) 6.25 MG tablet TAKE 1 TABLET TWICE DAILY WITH MEALS 180 tablet 1   clopidogrel (PLAVIX) 75 MG tablet TAKE 1 TABLET EVERY DAY 90 tablet 3   Continuous Blood Gluc Receiver (FREESTYLE LIBRE READER) DEVI 1 each by Does not apply route as directed. 1 each 0   empagliflozin (JARDIANCE) 25 MG TABS tablet Take 1 tablet (25 mg total) by mouth daily before breakfast. 90 tablet 3   ezetimibe (ZETIA) 10 MG tablet TAKE 1 TABLET EVERY DAY 90 tablet 3   famotidine (PEPCID) 10 MG tablet Take 10 mg by mouth 2 (two) times daily as needed for heartburn or indigestion.      fluticasone (FLONASE) 50 MCG/ACT nasal spray Place 1 spray into both nostrils daily as needed for allergies.     furosemide (LASIX) 80 MG tablet Take 1 tablet (80 mg total) by mouth as needed for fluid.     gabapentin (NEURONTIN) 300 MG capsule Take 1 capsule (300 mg total) by mouth 2 (two) times daily.     glucose blood (TRUE METRIX BLOOD GLUCOSE TEST) test strip Use as instructed 100 each 12   insulin NPH-regular Human (NOVOLIN 70/30) (70-30) 100 UNIT/ML injection Inject 25-32 Units into the skin daily. Reports 20 units in morning and 10-12 at night if needed.     isosorbide mononitrate (IMDUR) 30 MG 24 hr tablet Take 0.5 tablets (15 mg total) by mouth daily. 45 tablet 3   levothyroxine (SYNTHROID, LEVOTHROID) 50 MCG tablet Take 50 mcg by mouth every morning.      lidocaine (LIDODERM) 5 % Place 1 patch onto the skin daily. Remove & Discard patch within 12 hours or as directed by MD.  Apply to low mid back at site of  maximal pain. 9 patch 0   midodrine (PROAMATINE) 2.5 MG tablet Take 1 tablet (2.5 mg total) by mouth 3 (three) times daily with meals. 90 tablet 0   nitroGLYCERIN (NITROSTAT) 0.4 MG SL tablet DISSOLVE 1 TABLET UNDER THE TONGUE EVERY 5 MINUTES AS NEEDED FOR CHEST PAIN UP TO 3 TIMES. CALL EMS IF YOU NEED A SECOND. 25 tablet 11   pantoprazole (PROTONIX) 40 MG tablet  TAKE 1 TABLET EVERY DAY 90 tablet 3   ranolazine (RANEXA) 500 MG 12 hr tablet TAKE 1 TABLET TWICE DAILY 180 tablet 1   TRADJENTA 5 MG TABS tablet TAKE 1 TABLET EVERY DAY 90 tablet 3   traMADol (ULTRAM) 50 MG tablet Take 1 tablet (50 mg total) by mouth every 6 (six) hours as needed for severe pain (pain score 7-10). 20 tablet 0   TRUEplus Lancets 33G MISC Use to test blood sugar once daily. ICD: E11.65     colchicine 0.6 MG tablet Take 1 tablet (0.6 mg total) by mouth 2 (two) times daily as needed (gout flare up.). 30 tablet 1   traMADol (ULTRAM) 50 MG tablet Take 1 tablet (50 mg total) by mouth every 6 (six) hours as needed for up to 5 days for severe pain. 20 tablet 0   No current facility-administered medications on file prior to visit.   Past Medical History:  Diagnosis Date   Anemia    a. Noted on labs 02/2014 possibly procedurally related.   Asthma    CAD S/P percutaneous coronary angioplasty 01/22/2014   A. (12/2013): POBA - OM2 99% (very tortuous segment) - reduced ~ 50% & TIMI 3 flow, 80-90% stenosis in mid PDA, Irregularities <50% in mRCA, pLAD and prox LCx; b. 02/17/14: Moderate sized fixed defect in Lateral wall --> cath with OM2 restenosis & otherwise stable --> Xience Alpine DES x 2 (2.25 mm x 12 & 8 mm). c. NSTEMI 02/2014 s/p PTCA/balloon angioplasty only to small caliber PDA.   Carotid stenosis    Carotid US 11/17: L 1-39; FU prn   Chronic systolic heart failure (HCC)    a. ICM b. ECHO (01/2014): EF 25-30%, grade I DD, trivial MR. c. EF by Myoview 02/17/14 = ~53%. d. EF by cath 02/24/14 - improved to 50-55%.   CKD (chronic  kidney disease), stage III (HCC)    Contrast media allergy    Environmental allergies    GERD (gastroesophageal reflux disease)    Gout, unspecified    HOH (hard of hearing)    Hyperlipidemia    a. H/o intolerance to Zocor, Lipitor. Had muscle aches on higher dose Crestor.   Hypertension    Hypothyroidism    LBBB (left bundle branch block)    a. Transient during 02/2014 admission.   NSTEMI (non-ST elevated myocardial infarction) (HCC) 2014; 12/2013, 01/2014   QT prolongation    a. Noted on EKG 02/2014.   Sinus bradycardia    a. HR 40s-50s during 02/2014 admission, limiting BB dose.   TIA (transient ischemic attack) 1990's   a. TIA vs stroke 1990's "mild" - sounds like a TIA as she was told she had stroke symptoms but negative scans. Denies residual effects.   Type II diabetes mellitus [facility])    Past Surgical History:  Procedure Laterality Date   ABDOMINAL HYSTERECTOMY     complete   AMPUTATION TOE Right 02/25/2021   hammer toe.   APPENDECTOMY  1959   CARDIAC CATHETERIZATION  02/24/2014   Procedure: CORONARY BALLOON ANGIOPLASTY;  Surgeon: Kathleene Hazel, MD;  Location: [facility] CATH LAB;  Service: Cardiovascular;;   CARDIAC CATHETERIZATION N/A 04/20/2015   Procedure: Left Heart Cath and Coronary Angiography;  Surgeon: Lyn Records, MD;  Location: [facility] INVASIVE CV LAB;  Service: Cardiovascular;  Laterality: N/A;   CATARACT EXTRACTION W/ INTRAOCULAR LENS  IMPLANT, BILATERAL Bilateral 2013   CESAREAN SECTION  1959; 1960   CHOLECYSTECTOMY  1989   CORONARY ANGIOPLASTY  12/2013   POBA - OM2   CORONARY ARTERY BYPASS GRAFT N/A 04/25/2015   Procedure: CORONARY ARTERY BYPASS GRAFTING (CABG) x four,  using left internal mammary artery and right leg greater saphenous vein harvested endoscopically;  Surgeon: Alleen Borne, MD;  Location: [facility] OR;  Service: Open Heart Surgery;  Laterality: N/A;   JOINT REPLACEMENT Right 10 years ago    right knee (TKR)   LEFT HEART CATHETERIZATION WITH CORONARY  ANGIOGRAM N/A 01/22/2014   Procedure: LEFT HEART CATHETERIZATION WITH CORONARY ANGIOGRAM;  Surgeon: Lesleigh Noe, MD;  Location: [facility] CATH LAB;  Service: Cardiovascular;  Laterality: N/A;   LEFT HEART CATHETERIZATION WITH CORONARY ANGIOGRAM N/A 02/17/2014   Procedure: LEFT HEART CATHETERIZATION WITH CORONARY ANGIOGRAM;  Surgeon: Kathleene Hazel, MD;  Location: [facility] CATH LAB;  Service: Cardiovascular;  Laterality: N/A;   LEFT HEART CATHETERIZATION WITH CORONARY ANGIOGRAM N/A 02/24/2014   Procedure: LEFT HEART CATHETERIZATION WITH CORONARY ANGIOGRAM;  Surgeon: Kathleene Hazel, MD;  Location: [facility] CATH LAB;  Service: Cardiovascular;  Laterality: N/A;   LUMBAR LAMINECTOMY/DECOMPRESSION MICRODISCECTOMY Left 12/22/2014   Procedure: CENTRAL DECOMPRESSIVE LUMBAR, AND LAMINECTOMY L5-S1,  S1-S2 FOR SPINAL STENOSIS FORAMINOTOMY L5, S1, S2 ROOTS ON LEFT FOR FORAMINAL STENOSIS, AND DECOMPRESSION L5-S1, S1-S2 ACCORDING TO LABELING OF [facility] X-RAYS;  Surgeon: Ranee Gosselin, MD;  Location: [facility] ORS;  Service: Orthopedics;  Laterality: Left;   PERCUTANEOUS CORONARY STENT INTERVENTION (PCI-S)  02/17/2014   For restenosis of OM2 - PCI with Xience Apline DES 2.25 mm x 12 mm & 2.25 mm x 8 mm overlapping   REDUCTION MAMMAPLASTY Bilateral 1990's   SHOULDER OPEN ROTATOR CUFF REPAIR Bilateral 1980's - 1990's   TEE WITHOUT CARDIOVERSION N/A 04/25/2015   Procedure: TRANSESOPHAGEAL ECHOCARDIOGRAM (TEE);  Surgeon: Alleen Borne, MD;  Location: [facility] OR;  Service: Open Heart Surgery;  Laterality: N/A;   TOTAL KNEE ARTHROPLASTY Right 2009   TOTAL KNEE ARTHROPLASTY Left 04/21/2018   TKR;  Surgeon: Dannielle Huh, MD;  Location: [facility] ORS;  Service: Orthopedics;  Laterality: Left;  Adductor Block    Family History  Problem Relation Age of Onset   Hypertension Mother    Diabetes Mother    Heart attack Mother 17   Heart disease Father    Emphysema Father    Diabetes Brother    Heart disease Brother     Cancer Sister    Stroke Neg Hx    Social History   Socioeconomic History   Marital status: Widowed    Spouse name: Not on file   Number of children: 2   Years of education: Not on file   Highest education level: Not on file  Occupational History   Occupation: Retired  Tobacco Use   Smoking status: Never   Smokeless tobacco: Never  Vaping Use   Vaping status: Never Used  Substance and Sexual Activity   Alcohol use: No   Drug use: No   Sexual activity: Never  Other Topics Concern   Not on file  Social History Narrative   Lives in [place] by herself.    Social Drivers of Corporate investment banker Strain: Low Risk  (10/22/2022)   Overall Financial Resource Strain (CARDIA)    Difficulty of Paying Living Expenses: Not hard at all  Food Insecurity: No Food Insecurity (07/05/2023)   Hunger Vital Sign    Worried About Running Out of Food in the Last Year: Never true    Ran Out of Food in the Last Year:  Never true  Transportation Needs: No Transportation Needs (07/05/2023)   PRAPARE - Administrator, Civil Service (Medical): No    Lack of Transportation (Non-Medical): No  Physical Activity: Sufficiently Active (10/22/2022)   Exercise Vital Sign    Days of Exercise per Week: 6 days    Minutes of Exercise per Session: 40 min  Stress: No Stress Concern Present (10/22/2022)   [employer] of Occupational Health - Occupational Stress Questionnaire    Feeling of Stress : Not at all  Social Connections: Moderately Isolated (07/05/2023)   Social Connection and Isolation Panel [NHANES]    Frequency of Communication with Friends and Family: Never    Frequency of Social Gatherings with Friends and Family: Never    Attends Religious Services: More than 4 times per year    Active Member of [employer] or Organizations: Yes    Attends Banker Meetings: More than 4 times per year    Marital Status: Widowed    Objective:  BP 116/62   Pulse 62   Temp (!) 97.3 F  (36.3 C)   Ht 5\' 7"  (1.702 m)   Wt 184 lb (83.5 kg)   SpO2 98%   BMI 28.82 kg/m      07/19/2023    9:50 AM 07/08/2023    3:33 PM 07/08/2023   11:58 AM  BP/Weight  Systolic BP 116 159 149  Diastolic BP 62 56 50  Wt. (Lbs) 184    BMI 28.82 kg/m2      Physical Exam Vitals and nursing note reviewed.  Constitutional:      Appearance: Normal appearance.  HENT:     Head: Normocephalic and atraumatic.  Cardiovascular:     Rate and Rhythm: Normal rate and regular rhythm.  Pulmonary:     Effort: Pulmonary effort is normal.     Breath sounds: Normal breath sounds.  Musculoskeletal:        General: Tenderness (Mild lumbar spine tenderness at the L1-L2 level, no paraspinal tenderness on palpation today) present.  Neurological:     General: No focal deficit present.     Mental Status: She is alert.  Psychiatric:        Mood and Affect: Mood normal.     Diabetic Foot Exam - Simple   No data filed      Lab Results  Component Value Date   WBC 11.3 (H) 07/05/2023   HGB 12.4 07/05/2023   HCT 37.4 07/05/2023   PLT 220 07/05/2023   GLUCOSE 156 (H) 07/08/2023   CHOL 181 06/21/2023   TRIG 228 (H) 06/21/2023   HDL 37 (L) 06/21/2023   LDLCALC 105 (H) 06/21/2023   ALT 18 07/04/2023   AST 28 07/04/2023   NA 136 07/08/2023   K 4.3 07/08/2023   CL 99 07/08/2023   CREATININE 2.18 (H) 07/08/2023   BUN 46 (H) 07/08/2023   CO2 24 07/08/2023   TSH 2.750 02/18/2023   INR 0.94 04/21/2018   HGBA1C 7.9 (H) 06/21/2023   MICROALBUR 2.1 (H) 07/26/2014      Assessment & Plan:    Hospital discharge follow-up Assessment & Plan: Admitted to the [facility] from January 9 till January 13. Transition of care phone calls attempted several times but patient did not answer.  Experienced a syncopal episode on January 9, likely due to vasovagal syncope as indicated by positive orthostatic signs and acute kidney injury from dehydration. No fractures or cardiac issues identified. Blood  pressure remains low. Discussed  risks of recurrent falls and benefits of midodrine to manage blood pressure. Explained that midodrine should be reduced or held if blood pressure increases. - Continue midodrine 2.5 mg three times daily - Monitor blood pressure; reduce midodrine to once daily if blood pressure increases - Hold midodrine if blood pressure remains elevated - continue home physical therapy   Acute right-sided low back pain without sciatica Assessment & Plan: Chronic low back pain exacerbated by recent fall causing acute low back pain which is right-sided, without sciatica   pain localized to right low back and right flank, not radiating to the leg. History of lower back surgery.   Current pain management includes Tylenol, which is ineffective, and gabapentin for neuropathy.   Discussed risks of tramadol, including drowsiness and increased fall risk, and benefits of muscle relaxants and physical therapy. - Prescribe tramadol 50 mg once daily in the morning, 30 tablets with no refills.  Reviewed PDMP.  She has been on tramadol chronically.  Does not take it more than once daily.  No suspicion for overuse  - Refill muscle relaxant Robaxin; take up to three times daily, advised to try this first before taking the tramadol. - Encourage use of heating pad and lidocaine patches as needed - Continue physical therapy exercises and stretches - Consider using a walker for support to prevent falls    Postural hypotension Assessment & Plan: Lives alone and has been advised to avoid bending over to prevent falls. Uses a walker and has a chair in the kitchen for safety. Discussed importance of safe practices and adequate hydration and nutrition. - Encourage safe practices to prevent falls - Ensure adequate hydration and nutrition - Continue using assistive devices as needed - continue midodrine and to monitor home blood pressures.  Follow-up - Follow up with Dr. Sedalia Muta on April  7.   Other orders -     Methocarbamol; Take 1 tablet (500 mg total) by mouth every 8 (eight) hours as needed for muscle spasms.  Dispense: 90 tablet; Refill: 0 -     traMADol HCl; Take 1 tablet (50 mg total) by mouth daily as needed.  Dispense: 30 tablet; Refill: 0     Meds ordered this encounter  Medications   methocarbamol (ROBAXIN) 500 MG tablet    Sig: Take 1 tablet (500 mg total) by mouth every 8 (eight) hours as needed for muscle spasms.    Dispense:  90 tablet    Refill:  0   traMADol (ULTRAM) 50 MG tablet    Sig: Take 1 tablet (50 mg total) by mouth daily as needed.    Dispense:  30 tablet    Refill:  0    No orders of the defined types were placed in this encounter.    Follow-up: No follow-ups on file. An After Visit Summary was printed and given to the patient.  Windell Moment, MD [facility] [phone]

## 2023-07-19 NOTE — Assessment & Plan Note (Signed)
Chronic low back pain exacerbated by recent fall causing acute low back pain which is right-sided, without sciatica   pain localized to right low back and right flank, not radiating to the leg. History of lower back surgery.   Current pain management includes Tylenol, which is ineffective, and gabapentin for neuropathy.   Discussed risks of tramadol, including drowsiness and increased fall risk, and benefits of muscle relaxants and physical therapy. - Prescribe tramadol 50 mg once daily in the morning, 30 tablets with no refills.  Reviewed PDMP.  She has been on tramadol chronically.  Does not take it more than once daily.  No suspicion for overuse  - Refill muscle relaxant Robaxin; take up to three times daily, advised to try this first before taking the tramadol. - Encourage use of heating pad and lidocaine patches as needed - Continue physical therapy exercises and stretches - Consider using a walker for support to prevent falls

## 2023-07-19 NOTE — Assessment & Plan Note (Signed)
Admitted to the [facility] from January 9 till January 13. Transition of care phone calls attempted several times but patient did not answer.  Experienced a syncopal episode on January 9, likely due to vasovagal syncope as indicated by positive orthostatic signs and acute kidney injury from dehydration. No fractures or cardiac issues identified. Blood pressure remains low. Discussed risks of recurrent falls and benefits of midodrine to manage blood pressure. Explained that midodrine should be reduced or held if blood pressure increases. - Continue midodrine 2.5 mg three times daily - Monitor blood pressure; reduce midodrine to once daily if blood pressure increases - Hold midodrine if blood pressure remains elevated - continue home physical therapy

## 2023-07-22 ENCOUNTER — Telehealth: Payer: Self-pay

## 2023-07-22 DIAGNOSIS — N184 Chronic kidney disease, stage 4 (severe): Secondary | ICD-10-CM | POA: Diagnosis not present

## 2023-07-22 DIAGNOSIS — E039 Hypothyroidism, unspecified: Secondary | ICD-10-CM | POA: Diagnosis not present

## 2023-07-22 DIAGNOSIS — I2510 Atherosclerotic heart disease of native coronary artery without angina pectoris: Secondary | ICD-10-CM

## 2023-07-22 DIAGNOSIS — I130 Hypertensive heart and chronic kidney disease with heart failure and stage 1 through stage 4 chronic kidney disease, or unspecified chronic kidney disease: Secondary | ICD-10-CM | POA: Diagnosis not present

## 2023-07-22 DIAGNOSIS — E1122 Type 2 diabetes mellitus with diabetic chronic kidney disease: Secondary | ICD-10-CM | POA: Diagnosis not present

## 2023-07-22 DIAGNOSIS — I951 Orthostatic hypotension: Secondary | ICD-10-CM | POA: Diagnosis not present

## 2023-07-22 DIAGNOSIS — E860 Dehydration: Secondary | ICD-10-CM | POA: Diagnosis not present

## 2023-07-22 DIAGNOSIS — J45909 Unspecified asthma, uncomplicated: Secondary | ICD-10-CM

## 2023-07-22 DIAGNOSIS — I5022 Chronic systolic (congestive) heart failure: Secondary | ICD-10-CM | POA: Diagnosis not present

## 2023-07-22 DIAGNOSIS — H9190 Unspecified hearing loss, unspecified ear: Secondary | ICD-10-CM

## 2023-07-22 DIAGNOSIS — N179 Acute kidney failure, unspecified: Secondary | ICD-10-CM | POA: Diagnosis not present

## 2023-07-22 DIAGNOSIS — D631 Anemia in chronic kidney disease: Secondary | ICD-10-CM | POA: Diagnosis not present

## 2023-07-22 NOTE — Telephone Encounter (Signed)
[facility]-CLIENT COORDINATION NOTE REPORT-ORDERS TO BE FAXED BACK "OT TO TREAT CLIENT 1 WK5, 1 Q2 WK 2, 1 WK 1 TO IMPROVE STRENGTH, COORDINATION, ACTIVITY TOLERANCE SKILLS, IMPROVE SITTING AND STANDING BALANCE SKILLS WITH FUNCTIONAL TRANSFERS, IMPROVE GROOMING TOILETING DRESSING BATHING AND IADL SKILLS PERFORMANCE WITH APPROPRIATE ADAPTIVE EQUIPMENT AND ENERGY CONSERVATION TECHNIQUES"

## 2023-07-25 DIAGNOSIS — D631 Anemia in chronic kidney disease: Secondary | ICD-10-CM | POA: Diagnosis not present

## 2023-07-25 DIAGNOSIS — I951 Orthostatic hypotension: Secondary | ICD-10-CM | POA: Diagnosis not present

## 2023-07-25 DIAGNOSIS — N179 Acute kidney failure, unspecified: Secondary | ICD-10-CM | POA: Diagnosis not present

## 2023-07-25 DIAGNOSIS — N184 Chronic kidney disease, stage 4 (severe): Secondary | ICD-10-CM | POA: Diagnosis not present

## 2023-07-25 DIAGNOSIS — E039 Hypothyroidism, unspecified: Secondary | ICD-10-CM | POA: Diagnosis not present

## 2023-07-25 DIAGNOSIS — E1122 Type 2 diabetes mellitus with diabetic chronic kidney disease: Secondary | ICD-10-CM | POA: Diagnosis not present

## 2023-07-25 DIAGNOSIS — E860 Dehydration: Secondary | ICD-10-CM | POA: Diagnosis not present

## 2023-07-25 DIAGNOSIS — I5022 Chronic systolic (congestive) heart failure: Secondary | ICD-10-CM | POA: Diagnosis not present

## 2023-07-25 DIAGNOSIS — I130 Hypertensive heart and chronic kidney disease with heart failure and stage 1 through stage 4 chronic kidney disease, or unspecified chronic kidney disease: Secondary | ICD-10-CM | POA: Diagnosis not present

## 2023-07-30 ENCOUNTER — Other Ambulatory Visit (HOSPITAL_COMMUNITY): Payer: Self-pay

## 2023-07-30 ENCOUNTER — Other Ambulatory Visit: Payer: Self-pay

## 2023-07-30 ENCOUNTER — Other Ambulatory Visit: Payer: Self-pay | Admitting: Family Medicine

## 2023-07-30 ENCOUNTER — Telehealth: Payer: Self-pay | Admitting: Family Medicine

## 2023-07-30 ENCOUNTER — Ambulatory Visit: Payer: Medicare HMO | Admitting: Physician Assistant

## 2023-07-30 DIAGNOSIS — E1122 Type 2 diabetes mellitus with diabetic chronic kidney disease: Secondary | ICD-10-CM

## 2023-07-30 MED ORDER — FREESTYLE LIBRE 2 READER DEVI
1.0000 | 0 refills | Status: DC
  Filled 2023-07-30: qty 1, fill #0

## 2023-07-30 MED ORDER — FREESTYLE LIBRE 2 SENSOR MISC
1.0000 | 3 refills | Status: DC
  Filled 2023-07-30: qty 1, fill #0

## 2023-07-30 MED ORDER — EMPAGLIFLOZIN 25 MG PO TABS
25.0000 mg | ORAL_TABLET | Freq: Every day | ORAL | 3 refills | Status: DC
  Filled 2023-07-30: qty 90, 90d supply, fill #0

## 2023-07-30 NOTE — Telephone Encounter (Signed)
[facility] - order #[id number]

## 2023-07-31 ENCOUNTER — Other Ambulatory Visit: Payer: Self-pay

## 2023-08-13 NOTE — Telephone Encounter (Unsigned)
 Copied from CRM [id number]. Topic: General - Other >> Aug 13, 2023 11:04 AM Gildardo Pounds wrote: Reason for CRM: Elby Showers with [facility] states that patient has met all goals for occupational therapy. Patient is being released today. Callback number [phone]

## 2023-08-14 ENCOUNTER — Telehealth: Payer: Self-pay | Admitting: Family Medicine

## 2023-08-14 NOTE — Telephone Encounter (Signed)
 [facility]- ORDER [phone]

## 2023-08-29 DIAGNOSIS — E113592 Type 2 diabetes mellitus with proliferative diabetic retinopathy without macular edema, left eye: Secondary | ICD-10-CM | POA: Diagnosis not present

## 2023-08-29 LAB — OPHTHALMOLOGY REPORT-SCANNED

## 2023-08-30 DIAGNOSIS — E039 Hypothyroidism, unspecified: Secondary | ICD-10-CM | POA: Diagnosis not present

## 2023-08-30 DIAGNOSIS — E1169 Type 2 diabetes mellitus with other specified complication: Secondary | ICD-10-CM | POA: Diagnosis not present

## 2023-08-30 DIAGNOSIS — H9193 Unspecified hearing loss, bilateral: Secondary | ICD-10-CM | POA: Diagnosis not present

## 2023-08-30 DIAGNOSIS — Z794 Long term (current) use of insulin: Secondary | ICD-10-CM | POA: Diagnosis not present

## 2023-08-30 DIAGNOSIS — I209 Angina pectoris, unspecified: Secondary | ICD-10-CM | POA: Diagnosis not present

## 2023-08-30 DIAGNOSIS — E663 Overweight: Secondary | ICD-10-CM | POA: Diagnosis not present

## 2023-08-30 DIAGNOSIS — E785 Hyperlipidemia, unspecified: Secondary | ICD-10-CM | POA: Diagnosis not present

## 2023-08-30 DIAGNOSIS — Z89421 Acquired absence of other right toe(s): Secondary | ICD-10-CM | POA: Diagnosis not present

## 2023-08-30 DIAGNOSIS — E876 Hypokalemia: Secondary | ICD-10-CM | POA: Diagnosis not present

## 2023-08-30 DIAGNOSIS — H3530 Unspecified macular degeneration: Secondary | ICD-10-CM | POA: Diagnosis not present

## 2023-08-30 DIAGNOSIS — E1142 Type 2 diabetes mellitus with diabetic polyneuropathy: Secondary | ICD-10-CM | POA: Diagnosis not present

## 2023-08-30 DIAGNOSIS — G8929 Other chronic pain: Secondary | ICD-10-CM | POA: Diagnosis not present

## 2023-09-03 ENCOUNTER — Telehealth: Payer: Self-pay

## 2023-09-03 NOTE — Telephone Encounter (Signed)
 FYI Copied from CRM [id number]. Topic: General - Other >> Sep 03, 2023  4:25 PM Ja-Kwan M wrote: Reason for CRM: Tanner with [facility] reports that patient will be discharged because she has met all of her goals. Call back# [phone]

## 2023-09-24 DIAGNOSIS — E1122 Type 2 diabetes mellitus with diabetic chronic kidney disease: Secondary | ICD-10-CM | POA: Diagnosis not present

## 2023-09-29 ENCOUNTER — Encounter: Payer: Self-pay | Admitting: Family Medicine

## 2023-09-29 NOTE — Assessment & Plan Note (Signed)
 Well controlled.  No changes to medicines. Imdur 60 mg daily, Carvedilol 6.25 mg one twice daily, and lasix 80 mg daily.and ranexa 500 mg twice daily.  Continue to work on eating a healthy diet and exercise.  Labs drawn today.

## 2023-09-29 NOTE — Assessment & Plan Note (Signed)
 Control: worsened Recommend check feet daily. Recommend annual eye exams. Medicines: Jardiance 25 mg daily samples given. Continue Novolin 70/30 to 25 U before breakfast and tradjenta 5 mg daily. Continue to work on eating a healthy diet and exercise.  Labs drawn today.

## 2023-09-29 NOTE — Assessment & Plan Note (Signed)
 The current medical regimen is effective;  continue present plan and medications. - Continue levothyroxine

## 2023-09-29 NOTE — Assessment & Plan Note (Signed)
The current medical regimen is effective;  continue present plan and medications. Continue allopurinol 300 mg daily.  

## 2023-09-29 NOTE — Progress Notes (Addendum)
 Subjective:  Patient ID: Carol [name], female    DOB: 06/07/1936  Age: 88 y.o. MRN: [id number]  Chief Complaint  Patient presents with   Medical Management of Chronic Issues   History of Present Illness   The patient, with a history of diabetes, presents after a recent hypoglycemic episode. She reports waking up on the floor after laying down with her dog, experiencing blurry vision and difficulty standing. She managed to get to the kitchen and consume orange juice and chocolate milk to raise her blood sugar. She was unable to check her blood sugar until her vision improved. She reports that she did not have her Freestyle Libre sensors at the time of the episode, but has since received a new order.  The patient also reports that she has been taking Novolin 70/30 30 units of insulin in the morning, which she finds effective in controlling her blood sugar, provided she eats correctly. If she does not eat properly, she experiences low blood sugar. She reports that splitting the insulin dose to Novolin 70/30 15 U before breakfast and 10 U before supper as previously recommended resulted in high blood sugar levels. She also mentions that she has been out of her Tradjenta medication for a year due to cost, but has recently been able to afford it with her new insurance and is requesting a new prescription. She has never been on a GLP1.  In addition to her diabetes, the patient has neuropathy, for which she takes gabapentin 300 mg once a day at night. She reports that taking it twice a day makes her feel "drunk." She also has a prescription for tramadol, which she takes as needed for severe pain. She has been experiencing pain from a toe from which the nail was removed, and has taken tramadol for this pain.  She took it once in the last 2 weeks.    The patient also has heart disease (known CORONARY ARTERY DISEASE AND CONGESTIVE HEART FAILURE), for which she takes carvedilol 6.25 mg twice daily, ranexa  500 mg twice daily, jardiance 25 mg daily, and isosorbide 30 mg 1/2 daily. She also takes Lasix 80 MG, taking a whole pill and half a pill alternating days. She also takes a potassium pill on the days she takes a whole Lasix pill. She has not seen her cardiologist recently, but plans to make an appointment.     Diabetes: Diabetes: neuropathy.  Last A1C was 7.9 Medications: Novolin 70/30 to 30 U before breakfast and tradjenta 5 mg daily. (States she has been out for the past year).  Jardiance 25 mg daily. Glucose log:  120-250.  CGM: free style [name] - ran out and was waiting for them to come. Received order with new sensors on Saturday.  Checking feet for sores.  Eats twice a day. Not eating her vegetables. Eating chicken breast, potatoes (air fry.) eating some fruit.   Neuropathy: On gabapentin 300 mg once a day at night. Ran out.   Hyperlipidemia: on zetia 10 mg daily, Intolerant to statins. On repatha, patient states that she has not been taking it consecutively due to one injector messing up and being off schedule.  GERD:  Not taking pantoprazole 40 mg daily or famotidine regularly. Rarely needs it.   Gout:  patient is on allopurinol 300 mg daily and colchicine twice daily as needed if she has a flare. Takes for about 3 days.   Hypothyroidism: on levothyroxine 50 mcg once daily in am.  07/19/2023    9:54 AM 05/06/2023    2:23 PM 02/18/2023   10:56 AM 10/22/2022    2:45 PM 07/16/2022    9:59 AM  Depression screen PHQ 2/9  Decreased Interest 0 0 0 0 0  Down, Depressed, Hopeless 0 0 0 0 0  PHQ - 2 Score 0 0 0 0 0  Altered sleeping   0    Tired, decreased energy   0    Change in appetite   0    Feeling bad or failure about yourself    0    Trouble concentrating   0    Moving slowly or fidgety/restless   0    Suicidal thoughts   0    PHQ-9 Score   0    Difficult doing work/chores   Not difficult at all          09/30/2023    8:27 AM  Fall Risk   Falls in the past year? 0   Number falls in past yr: 0  Injury with Fall? 0  Risk for fall due to : No Fall Risks  Follow up Falls evaluation completed    Patient Care Team: Mercy Stall, MD as PCP - General (Internal Medicine) Hassan Links, MD as Consulting Physician (Cardiology) Lei Pump, MD as Consulting Physician (Cardiology) [facility], [facility] Rolando Cliche, [facility] as Pharmacist (Pharmacist)   Review of Systems  Constitutional:  Negative for chills, fatigue and fever.  HENT:  Negative for congestion, ear pain, rhinorrhea and sore throat.   Respiratory:  Negative for cough and shortness of breath.   Cardiovascular:  Negative for chest pain.  Gastrointestinal:  Negative for abdominal pain, constipation, diarrhea, nausea and vomiting.  Genitourinary:  Negative for dysuria and urgency.  Musculoskeletal:  Positive for back pain. Negative for myalgias.  Neurological:  Negative for dizziness, weakness, light-headedness and headaches.  Psychiatric/Behavioral:  Negative for dysphoric mood. The patient is not nervous/anxious.     Current Outpatient Medications on File Prior to Visit  Medication Sig Dispense Refill   acetaminophen (TYLENOL) 500 MG tablet Take 2 tablets (1,000 mg total) by mouth every 8 (eight) hours as needed for mild pain (pain score 1-3) or moderate pain (pain score 4-6).     albuterol (VENTOLIN HFA) 108 (90 Base) MCG/ACT inhaler INHALE 2 PUFFS INTO THE LUNGS EVERY 6 (SIX) HOURS AS NEEDED FOR WHEEZING OR SHORTNESS OF BREATH. 4 each 3   Alcohol Swabs (ALCOHOL WIPES) 70 % PADS USE ONE PAD TO CLEAN AREA TO CHECK BLOOD SUGAR AS  DIRECTED     allopurinol (ZYLOPRIM) 300 MG tablet Take 0.5 tablets (150 mg total) by mouth daily.     Biotin 5000 MCG CAPS Take 5,000 mcg by mouth every morning.     Blood Glucose Monitoring Suppl (TRUE METRIX METER) DEVI Use to test blood sugars once daily. ICD: E11.65     calcium carbonate (OSCAL) 1500 (600 Ca) MG TABS tablet Take 600 mg by mouth daily with  breakfast.     carvedilol (COREG) 6.25 MG tablet TAKE 1 TABLET TWICE DAILY WITH MEALS 180 tablet 1   clopidogrel (PLAVIX) 75 MG tablet TAKE 1 TABLET EVERY DAY 90 tablet 3   colchicine 0.6 MG tablet Take 1 tablet (0.6 mg total) by mouth 2 (two) times daily as needed (gout flare up.). 30 tablet 1   Continuous Glucose Receiver (FREESTYLE LIBRE 2 READER) DEVI 1 each by Does not apply route as directed. 1 each 0  Continuous Glucose Sensor (FREESTYLE LIBRE 2 SENSOR) MISC Use as directed every 14 (fourteen) days. 1 each 3   empagliflozin (JARDIANCE) 25 MG TABS tablet Take 1 tablet (25 mg total) by mouth daily before breakfast. 90 tablet 3   ezetimibe (ZETIA) 10 MG tablet TAKE 1 TABLET EVERY DAY 90 tablet 3   famotidine (PEPCID) 10 MG tablet Take 10 mg by mouth 2 (two) times daily as needed for heartburn or indigestion.      fluticasone (FLONASE) 50 MCG/ACT nasal spray Place 1 spray into both nostrils daily as needed for allergies.     gabapentin (NEURONTIN) 300 MG capsule Take 1 capsule (300 mg total) by mouth 2 (two) times daily.     glucose blood (TRUE METRIX BLOOD GLUCOSE TEST) test strip Use as instructed 100 each 12   insulin NPH-regular Human (NOVOLIN 70/30) (70-30) 100 UNIT/ML injection Inject 25-32 Units into the skin daily. Reports 20 units in morning and 10-12 at night if needed.     isosorbide mononitrate (IMDUR) 30 MG 24 hr tablet Take 0.5 tablets (15 mg total) by mouth daily. 45 tablet 3   levothyroxine (SYNTHROID, LEVOTHROID) 50 MCG tablet Take 50 mcg by mouth every morning.      lidocaine (LIDODERM) 5 % Place 1 patch onto the skin daily. Remove & Discard patch within 12 hours or as directed by MD.  Apply to low mid back at site of maximal pain. 9 patch 0   nitroGLYCERIN (NITROSTAT) 0.4 MG SL tablet DISSOLVE 1 TABLET UNDER THE TONGUE EVERY 5 MINUTES AS NEEDED FOR CHEST PAIN UP TO 3 TIMES. CALL EMS IF YOU NEED A SECOND. 25 tablet 11   traMADol (ULTRAM) 50 MG tablet Take 1 tablet (50 mg total)  by mouth every 6 (six) hours as needed for severe pain (pain score 7-10). 20 tablet 0   traMADol (ULTRAM) 50 MG tablet Take 1 tablet (50 mg total) by mouth daily as needed. 30 tablet 0   TRUEplus Lancets 33G MISC Use to test blood sugar once daily. ICD: E11.65     No current facility-administered medications on file prior to visit.   Past Medical History:  Diagnosis Date   Anemia    a. Noted on labs 02/2014 possibly procedurally related.   Asthma    CAD S/P percutaneous coronary angioplasty 01/22/2014   A. (12/2013): POBA - OM2 99% (very tortuous segment) - reduced ~ 50% & TIMI 3 flow, 80-90% stenosis in mid PDA, Irregularities <50% in mRCA, pLAD and prox LCx; b. 02/17/14: Moderate sized fixed defect in Lateral wall --> cath with OM2 restenosis & otherwise stable --> Xience Alpine DES x 2 (2.25 mm x 12 & 8 mm). c. NSTEMI 02/2014 s/p PTCA/balloon angioplasty only to small caliber PDA.   Carotid stenosis    Carotid US  11/17: L 1-39; FU prn   Chronic systolic heart failure (HCC)    a. ICM b. ECHO (01/2014): EF 25-30%, grade I DD, trivial MR. c. EF by Myoview 02/17/14 = ~53%. d. EF by cath 02/24/14 - improved to 50-55%.   CKD (chronic kidney disease), stage III (HCC)    Contrast media allergy    Environmental allergies    GERD (gastroesophageal reflux disease)    Gout, unspecified    HOH (hard of hearing)    Hyperlipidemia    a. H/o intolerance to Zocor, Lipitor. Had muscle aches on higher dose Crestor.   Hypertension    Hypothyroidism    LBBB (left bundle branch block)  a. Transient during 02/2014 admission.   NSTEMI (non-ST elevated myocardial infarction) (HCC) 2014; 12/2013, 01/2014   QT prolongation    a. Noted on EKG 02/2014.   Sinus bradycardia    a. HR 40s-50s during 02/2014 admission, limiting BB dose.   TIA (transient ischemic attack) 1990's   a. TIA vs stroke 1990's "mild" - sounds like a TIA as she was told she had stroke symptoms but negative scans. Denies residual effects.   Type II  diabetes mellitus [facility])    Past Surgical History:  Procedure Laterality Date   ABDOMINAL HYSTERECTOMY     complete   AMPUTATION TOE Right 02/25/2021   hammer toe.   APPENDECTOMY  1959   CARDIAC CATHETERIZATION  02/24/2014   Procedure: CORONARY BALLOON ANGIOPLASTY;  Surgeon: Odie Benne, MD;  Location: [facility] CATH LAB;  Service: Cardiovascular;;   CARDIAC CATHETERIZATION N/A 04/20/2015   Procedure: Left Heart Cath and Coronary Angiography;  Surgeon: Arty Binning, MD;  Location: [facility] INVASIVE CV LAB;  Service: Cardiovascular;  Laterality: N/A;   CATARACT EXTRACTION W/ INTRAOCULAR LENS  IMPLANT, BILATERAL Bilateral 2013   CESAREAN SECTION  1959; 1960   CHOLECYSTECTOMY  1989   CORONARY ANGIOPLASTY  12/2013   POBA - OM2   CORONARY ARTERY BYPASS GRAFT N/A 04/25/2015   Procedure: CORONARY ARTERY BYPASS GRAFTING (CABG) x four,  using left internal mammary artery and right leg greater saphenous vein harvested endoscopically;  Surgeon: Bartley Lightning, MD;  Location: [facility] OR;  Service: Open Heart Surgery;  Laterality: N/A;   JOINT REPLACEMENT Right 10 years ago    right knee (TKR)   LEFT HEART CATHETERIZATION WITH CORONARY ANGIOGRAM N/A 01/22/2014   Procedure: LEFT HEART CATHETERIZATION WITH CORONARY ANGIOGRAM;  Surgeon: Mickiel Albany, MD;  Location: [facility] CATH LAB;  Service: Cardiovascular;  Laterality: N/A;   LEFT HEART CATHETERIZATION WITH CORONARY ANGIOGRAM N/A 02/17/2014   Procedure: LEFT HEART CATHETERIZATION WITH CORONARY ANGIOGRAM;  Surgeon: Odie Benne, MD;  Location: [facility] CATH LAB;  Service: Cardiovascular;  Laterality: N/A;   LEFT HEART CATHETERIZATION WITH CORONARY ANGIOGRAM N/A 02/24/2014   Procedure: LEFT HEART CATHETERIZATION WITH CORONARY ANGIOGRAM;  Surgeon: Odie Benne, MD;  Location: [facility] CATH LAB;  Service: Cardiovascular;  Laterality: N/A;   LUMBAR LAMINECTOMY/DECOMPRESSION MICRODISCECTOMY Left 12/22/2014   Procedure: CENTRAL DECOMPRESSIVE LUMBAR, AND  LAMINECTOMY L5-S1,  S1-S2 FOR SPINAL STENOSIS FORAMINOTOMY L5, S1, S2 ROOTS ON LEFT FOR FORAMINAL STENOSIS, AND DECOMPRESSION L5-S1, S1-S2 ACCORDING TO LABELING OF [facility] X-RAYS;  Surgeon: Hazle Lites, MD;  Location: [facility] ORS;  Service: Orthopedics;  Laterality: Left;   PERCUTANEOUS CORONARY STENT INTERVENTION (PCI-S)  02/17/2014   For restenosis of OM2 - PCI with Xience Apline DES 2.25 mm x 12 mm & 2.25 mm x 8 mm overlapping   REDUCTION MAMMAPLASTY Bilateral 1990's   SHOULDER OPEN ROTATOR CUFF REPAIR Bilateral 1980's - 1990's   TEE WITHOUT CARDIOVERSION N/A 04/25/2015   Procedure: TRANSESOPHAGEAL ECHOCARDIOGRAM (TEE);  Surgeon: Bartley Lightning, MD;  Location: [facility] OR;  Service: Open Heart Surgery;  Laterality: N/A;   TOTAL KNEE ARTHROPLASTY Right 2009   TOTAL KNEE ARTHROPLASTY Left 04/21/2018   TKR;  Surgeon: Christie Humna Moorehouse, MD;  Location: [facility] ORS;  Service: Orthopedics;  Laterality: Left;  Adductor Block    Family History  Problem Relation Age of Onset   Hypertension Mother    Diabetes Mother    Heart attack Mother 68   Heart disease Father    Emphysema Father  Diabetes Brother    Heart disease Brother    Cancer Sister    Stroke Neg Hx    Social History   Socioeconomic History   Marital status: Widowed    Spouse name: Not on file   Number of children: 2   Years of education: Not on file   Highest education level: Not on file  Occupational History   Occupation: Retired  Tobacco Use   Smoking status: Never   Smokeless tobacco: Never  Vaping Use   Vaping status: Never Used  Substance and Sexual Activity   Alcohol use: No   Drug use: No   Sexual activity: Never  Other Topics Concern   Not on file  Social History Narrative   Lives in [place] by herself.    Social Drivers of Corporate investment banker Strain: Low Risk  (10/22/2022)   Overall Financial Resource Strain (CARDIA)    Difficulty of Paying Living Expenses: Not hard at all  Food Insecurity: No Food  Insecurity (07/05/2023)   Hunger Vital Sign    Worried About Running Out of Food in the Last Year: Never true    Ran Out of Food in the Last Year: Never true  Transportation Needs: No Transportation Needs (07/05/2023)   PRAPARE - Administrator, Civil Service (Medical): No    Lack of Transportation (Non-Medical): No  Physical Activity: Sufficiently Active (10/22/2022)   Exercise Vital Sign    Days of Exercise per Week: 6 days    Minutes of Exercise per Session: 40 min  Stress: No Stress Concern Present (10/22/2022)   [employer] of Occupational Health - Occupational Stress Questionnaire    Feeling of Stress : Not at all  Social Connections: Moderately Isolated (07/05/2023)   Social Connection and Isolation Panel [NHANES]    Frequency of Communication with Friends and Family: Never    Frequency of Social Gatherings with Friends and Family: Never    Attends Religious Services: More than 4 times per year    Active Member of [employer] or Organizations: Yes    Attends Banker Meetings: More than 4 times per year    Marital Status: Widowed    Objective:  BP 124/70   Pulse 72   Temp 97.8 F (36.6 C)   Ht 5\' 7"  (1.702 m)   Wt 191 lb (86.6 kg)   SpO2 97%   BMI 29.91 kg/m      09/30/2023    8:24 AM 07/19/2023    9:50 AM 07/08/2023    3:33 PM  BP/Weight  Systolic BP 124 116 159  Diastolic BP 70 62 56  Wt. (Lbs) 191 184   BMI 29.91 kg/m2 28.82 kg/m2     Physical Exam Vitals reviewed.  Constitutional:      Appearance: Normal appearance. She is obese.  Neck:     Vascular: No carotid bruit.  Cardiovascular:     Rate and Rhythm: Normal rate and regular rhythm.     Heart sounds: Normal heart sounds.  Pulmonary:     Effort: Pulmonary effort is normal. No respiratory distress.     Breath sounds: Normal breath sounds.  Abdominal:     General: Abdomen is flat. Bowel sounds are normal.     Palpations: Abdomen is soft.     Tenderness: There is no abdominal  tenderness.  Neurological:     Mental Status: She is alert and oriented to person, place, and time.  Psychiatric:  Mood and Affect: Mood normal.        Behavior: Behavior normal.     Diabetic Foot Exam - Simple   Simple Foot Form Diabetic Foot exam was performed with the following findings: Yes 09/30/2023  9:21 AM  Visual Inspection See comments: Yes Sensation Testing See comments: Yes Pulse Check Posterior Tibialis and Dorsalis pulse intact bilaterally: Yes Comments Large bunions BL. Missing second toe of right foot.  Decreased sensation normal.       Lab Results  Component Value Date   WBC 6.3 09/30/2023   HGB 12.9 09/30/2023   HCT 38.8 09/30/2023   PLT 215 09/30/2023   GLUCOSE 102 (H) 09/30/2023   CHOL 156 09/30/2023   TRIG 101 09/30/2023   HDL 53 09/30/2023   LDLCALC 85 09/30/2023   ALT 11 09/30/2023   AST 17 09/30/2023   NA 144 09/30/2023   K 4.9 09/30/2023   CL 103 09/30/2023   CREATININE 1.57 (H) 09/30/2023   BUN 34 (H) 09/30/2023   CO2 25 09/30/2023   TSH 3.060 09/30/2023   INR 0.94 04/21/2018   HGBA1C 7.5 (H) 09/30/2023   MICROALBUR 2.1 (H) 07/26/2014      Assessment & Plan:   Type 2 diabetes mellitus with hypoglycemia without coma, with long-term current use of insulin (HCC) Assessment & Plan: Control: worsened Recommend check feet daily. Recommend annual eye exams. Medicines: Continue jardiance 25 mg daily. I would prefer her insulin Novolin 70/30 be split before breakfast and supper to prevent hypoglycemia. Instead of restarting tradjenta, I would consider GLP1. Await A1C and visit with diabetes educator on Wednesday this week.  Experienced an episode characterized by blurry vision and difficulty getting up. Stabilized blood glucose with orange juice and chocolate milk. Likely due to insulin regimen and dietary habits. Currently on 30 units of 70/30 insulin in the morning, which may cause daytime hypoglycemia if not eating adequately.  Discussed Gvoke pen for severe hypoglycemia, which can rapidly raise blood glucose when injected. - Educate on Gvoke pen usage for severe hypoglycemia - Advise keeping glucose tablets or sugary snacks by the bedside - Schedule appointment with diabetes educator and pharmacist on April 9th Continue to work on eating a healthy diet and exercise.  Labs drawn today.     Orders: -     Hemoglobin A1c -     Microalbumin / creatinine urine ratio -     Gvoke HypoPen 2-Pack; Inject 0.5 mg into the skin daily as needed.  Dispense: 0.2 mL; Refill: 2 -     AMB Referral VBCI Care Management  Hypertensive heart and renal disease with renal failure, stage 1 through stage 4 or unspecified chronic kidney disease, with heart failure (HCC) Assessment & Plan: Well controlled.  No changes to medicines. Imdur 60 mg daily, Carvedilol 6.25 mg one twice daily, and lasix 80 mg daily.and ranexa 500 mg twice daily.  Continue to work on eating a healthy diet and exercise.  Labs drawn today.   Needs to call cardiology for follow up.  Orders: -     CBC with Differential/Platelet -     Comprehensive metabolic panel with GFR -     AMB Referral VBCI Care Management  Acquired hypothyroidism Assessment & Plan: The current medical regimen is effective;  continue present plan and medications. - Continue levothyroxine  Orders: -     TSH  Mixed hyperlipidemia Assessment & Plan: Well controlled.  Statin intolerant.  Continue zetia. I will have pharmacist, Rolando Cliche, review why  patient is not taking repatha. I think it is an access or a cost issue.  Labs ordered.   Orders: -     Lipid panel -     AMB Referral VBCI Care Management  Chronic gout without tophus, unspecified cause, unspecified site Assessment & Plan: The current medical regimen is effective;  continue present plan and medications.  Continue allopurinol 300 mg daily.    Heart failure with improved ejection fraction (HFimpEF) (HCC) -      Furosemide; 1/2 pill every other day alternating 1 pill every other day. -     AMB Referral VBCI Care Management  Gastroesophageal reflux disease without esophagitis Assessment & Plan: No longer taking PPI or H2 blocker.    Orders: -     Pantoprazole Sodium; Daily as needed gerd  Bunion of great toe of left foot Assessment & Plan: Refer podiatry for diabetic shoes for BL severe bunions.   Orders: -     Ambulatory referral to Podiatry  Bunion of great toe of right foot Assessment & Plan: Refer podiatry for diabetic shoes for BL severe bunions.   Orders: -     Ambulatory referral to Podiatry  CAD in native artery Assessment & Plan: Stable.  On imdur, plavix, carvedilol 6.25 mg twice daily,  and ranexa 500 mg twice daily.  Ozempic would be a good addition.   Orders: -     Ranolazine ER; Take 1 tablet (500 mg total) by mouth 2 (two) times daily.  Dispense: 180 tablet; Refill: 1  Chronic kidney disease (CKD), stage IV (severe) (HCC) Assessment & Plan: Stable. Sees nephrology.              Meds ordered this encounter  Medications   furosemide (LASIX) 80 MG tablet    Sig: 1/2 pill every other day alternating 1 pill every other day.   pantoprazole (PROTONIX) 40 MG tablet    Sig: Daily as needed gerd   ranolazine (RANEXA) 500 MG 12 hr tablet    Sig: Take 1 tablet (500 mg total) by mouth 2 (two) times daily.    Dispense:  180 tablet    Refill:  1   DISCONTD: Glucagon (GVOKE HYPOPEN 2-PACK) 0.5 MG/0.1ML SOAJ    Sig: Inject 0.5 mg into the skin daily as needed.    Dispense:  0.2 mL    Refill:  2   Glucagon (GVOKE HYPOPEN 2-PACK) 0.5 MG/0.1ML SOAJ    Sig: Inject 0.5 mg into the skin daily as needed.    Dispense:  0.2 mL    Refill:  2    Orders Placed This Encounter  Procedures   CBC with Differential/Platelet   Comprehensive metabolic panel with GFR   Hemoglobin A1c   Lipid panel   Microalbumin / creatinine urine ratio   TSH   Ambulatory referral to Podiatry    AMB Referral VBCI Care Management     Follow-up: Return in about 3 months (around 12/30/2023) for chronic follow up.   I,Marla I Leal-Borjas,acting as a scribe for Mercy Stall, MD.,have documented all relevant documentation on the behalf of Mercy Stall, MD,as directed by  Mercy Stall, MD while in the presence of Mercy Stall, MD.   An After Visit Summary was printed and given to the patient.  Mercy Stall, MD [facility] [phone]

## 2023-09-29 NOTE — Assessment & Plan Note (Signed)
 Well controlled.  Statin intolerant.  Continue zetia. I will have pharmacist, Dahlia Byes, review why patient is not taking repatha. I think it is an access or a cost issue.  Labs ordered.

## 2023-09-30 ENCOUNTER — Telehealth: Payer: Self-pay

## 2023-09-30 ENCOUNTER — Other Ambulatory Visit (HOSPITAL_COMMUNITY): Payer: Self-pay

## 2023-09-30 ENCOUNTER — Ambulatory Visit: Payer: Medicare HMO | Admitting: Family Medicine

## 2023-09-30 VITALS — BP 124/70 | HR 72 | Temp 97.8°F | Ht 67.0 in | Wt 191.0 lb

## 2023-09-30 DIAGNOSIS — I130 Hypertensive heart and chronic kidney disease with heart failure and stage 1 through stage 4 chronic kidney disease, or unspecified chronic kidney disease: Secondary | ICD-10-CM

## 2023-09-30 DIAGNOSIS — K219 Gastro-esophageal reflux disease without esophagitis: Secondary | ICD-10-CM | POA: Diagnosis not present

## 2023-09-30 DIAGNOSIS — E1140 Type 2 diabetes mellitus with diabetic neuropathy, unspecified: Secondary | ICD-10-CM | POA: Diagnosis not present

## 2023-09-30 DIAGNOSIS — E11649 Type 2 diabetes mellitus with hypoglycemia without coma: Secondary | ICD-10-CM | POA: Diagnosis not present

## 2023-09-30 DIAGNOSIS — I2510 Atherosclerotic heart disease of native coronary artery without angina pectoris: Secondary | ICD-10-CM | POA: Diagnosis not present

## 2023-09-30 DIAGNOSIS — E039 Hypothyroidism, unspecified: Secondary | ICD-10-CM

## 2023-09-30 DIAGNOSIS — I5032 Chronic diastolic (congestive) heart failure: Secondary | ICD-10-CM | POA: Diagnosis not present

## 2023-09-30 DIAGNOSIS — M21612 Bunion of left foot: Secondary | ICD-10-CM | POA: Diagnosis not present

## 2023-09-30 DIAGNOSIS — N184 Chronic kidney disease, stage 4 (severe): Secondary | ICD-10-CM | POA: Diagnosis not present

## 2023-09-30 DIAGNOSIS — Z794 Long term (current) use of insulin: Secondary | ICD-10-CM | POA: Diagnosis not present

## 2023-09-30 DIAGNOSIS — M1A9XX0 Chronic gout, unspecified, without tophus (tophi): Secondary | ICD-10-CM

## 2023-09-30 DIAGNOSIS — M21611 Bunion of right foot: Secondary | ICD-10-CM | POA: Diagnosis not present

## 2023-09-30 DIAGNOSIS — E782 Mixed hyperlipidemia: Secondary | ICD-10-CM

## 2023-09-30 MED ORDER — GVOKE HYPOPEN 2-PACK 0.5 MG/0.1ML ~~LOC~~ SOAJ: 0.5000 mg | Freq: Every day | SUBCUTANEOUS | 2 refills | Status: DC | PRN

## 2023-09-30 MED ORDER — FUROSEMIDE 80 MG PO TABS: ORAL_TABLET | ORAL | Status: DC

## 2023-09-30 MED ORDER — RANOLAZINE ER 500 MG PO TB12
500.0000 mg | ORAL_TABLET | Freq: Two times a day (BID) | ORAL | 1 refills | Status: DC
  Filled 2023-09-30: qty 60, 30d supply, fill #0

## 2023-09-30 MED ORDER — PANTOPRAZOLE SODIUM 40 MG PO TBEC: DELAYED_RELEASE_TABLET | ORAL | Status: DC

## 2023-09-30 MED ORDER — GVOKE HYPOPEN 2-PACK 0.5 MG/0.1ML ~~LOC~~ SOAJ
0.5000 mg | Freq: Every day | SUBCUTANEOUS | 2 refills | Status: DC | PRN
  Filled 2023-09-30: qty 0.2, 2d supply, fill #0

## 2023-09-30 NOTE — Assessment & Plan Note (Signed)
 Stable.  On imdur, plavix, carvedilol 6.25 mg twice daily,  and ranexa 500 mg twice daily.  Ozempic would be a good addition.

## 2023-09-30 NOTE — Assessment & Plan Note (Addendum)
 Refer podiatry for diabetic shoes for BL severe bunions.

## 2023-09-30 NOTE — Assessment & Plan Note (Signed)
 No longer taking PPI or H2 blocker.

## 2023-09-30 NOTE — Telephone Encounter (Signed)
 PA submitted via covermymeds for gvoke pen. "Prior Authorization not required for patient/medication"

## 2023-10-02 ENCOUNTER — Other Ambulatory Visit: Payer: Self-pay | Admitting: Family Medicine

## 2023-10-02 ENCOUNTER — Ambulatory Visit (INDEPENDENT_AMBULATORY_CARE_PROVIDER_SITE_OTHER)

## 2023-10-02 ENCOUNTER — Encounter: Payer: Self-pay | Admitting: Family Medicine

## 2023-10-02 ENCOUNTER — Ambulatory Visit

## 2023-10-02 DIAGNOSIS — Z794 Long term (current) use of insulin: Secondary | ICD-10-CM

## 2023-10-02 DIAGNOSIS — E11649 Type 2 diabetes mellitus with hypoglycemia without coma: Secondary | ICD-10-CM

## 2023-10-02 LAB — COMPREHENSIVE METABOLIC PANEL WITH GFR
ALT: 11 IU/L (ref 0–32)
AST: 17 IU/L (ref 0–40)
Albumin: 4.3 g/dL (ref 3.7–4.7)
Alkaline Phosphatase: 182 IU/L — ABNORMAL HIGH (ref 44–121)
BUN/Creatinine Ratio: 22 (ref 12–28)
BUN: 34 mg/dL — ABNORMAL HIGH (ref 8–27)
Bilirubin Total: 0.4 mg/dL (ref 0.0–1.2)
CO2: 25 mmol/L (ref 20–29)
Calcium: 9.5 mg/dL (ref 8.7–10.3)
Chloride: 103 mmol/L (ref 96–106)
Creatinine, Ser: 1.57 mg/dL — ABNORMAL HIGH (ref 0.57–1.00)
Globulin, Total: 2.5 g/dL (ref 1.5–4.5)
Glucose: 102 mg/dL — ABNORMAL HIGH (ref 70–99)
Potassium: 4.9 mmol/L (ref 3.5–5.2)
Sodium: 144 mmol/L (ref 134–144)
Total Protein: 6.8 g/dL (ref 6.0–8.5)
eGFR: 32 mL/min/{1.73_m2} — ABNORMAL LOW (ref >59)

## 2023-10-02 LAB — CBC WITH DIFFERENTIAL/PLATELET
Basophils Absolute: 0 10*3/uL (ref 0.0–0.2)
Basos: 1 %
EOS (ABSOLUTE): 0.1 10*3/uL (ref 0.0–0.4)
Eos: 1 %
Hematocrit: 38.8 % (ref 34.0–46.6)
Hemoglobin: 12.9 g/dL (ref 11.1–15.9)
Immature Grans (Abs): 0 10*3/uL (ref 0.0–0.1)
Immature Granulocytes: 0 %
Lymphocytes Absolute: 1.9 10*3/uL (ref 0.7–3.1)
Lymphs: 30 %
MCH: 32.1 pg (ref 26.6–33.0)
MCHC: 33.2 g/dL (ref 31.5–35.7)
MCV: 97 fL (ref 79–97)
Monocytes Absolute: 0.6 10*3/uL (ref 0.1–0.9)
Monocytes: 10 %
Neutrophils Absolute: 3.7 10*3/uL (ref 1.4–7.0)
Neutrophils: 58 %
Platelets: 215 10*3/uL (ref 150–450)
RBC: 4.02 x10E6/uL (ref 3.77–5.28)
RDW: 12.8 % (ref 11.7–15.4)
WBC: 6.3 10*3/uL (ref 3.4–10.8)

## 2023-10-02 LAB — HEMOGLOBIN A1C
Est. average glucose Bld gHb Est-mCnc: 169 mg/dL
Hgb A1c MFr Bld: 7.5 % — ABNORMAL HIGH (ref 4.8–5.6)

## 2023-10-02 LAB — LIPID PANEL
Chol/HDL Ratio: 2.9 ratio (ref 0.0–4.4)
Cholesterol, Total: 156 mg/dL (ref 100–199)
HDL: 53 mg/dL (ref >39)
LDL Chol Calc (NIH): 85 mg/dL (ref 0–99)
Triglycerides: 101 mg/dL (ref 0–149)
VLDL Cholesterol Cal: 18 mg/dL (ref 5–40)

## 2023-10-02 LAB — TSH: TSH: 3.06 u[IU]/mL (ref 0.450–4.500)

## 2023-10-02 LAB — MICROALBUMIN / CREATININE URINE RATIO
Creatinine, Urine: 53.2 mg/dL
Microalb/Creat Ratio: 257 mg/g{creat} — ABNORMAL HIGH (ref 0–29)
Microalbumin, Urine: 136.9 ug/mL

## 2023-10-02 MED ORDER — OZEMPIC (0.25 OR 0.5 MG/DOSE) 2 MG/3ML ~~LOC~~ SOPN: 0.2500 mg | PEN_INJECTOR | SUBCUTANEOUS | Status: DC

## 2023-10-02 MED ORDER — OZEMPIC (0.25 OR 0.5 MG/DOSE) 2 MG/3ML ~~LOC~~ SOPN: PEN_INJECTOR | SUBCUTANEOUS | 1 refills | Status: DC

## 2023-10-02 NOTE — Progress Notes (Signed)
   10/02/2023 Name: JUDAH CHEVERE MRN: [id number] DOB: Feb 11, 1936  No chief complaint on file.   WILHELMINE KROGSTAD is a 88 y.o. year old female who was referred for medication management by their primary care provider, Cox, Kirsten, MD. They presented for a face to face visit today.   They were referred to the pharmacist by their PCP for assistance in managing diabetes, medication access, and complex medication management    Subjective:  Care Team: Primary Care Provider: Blane Ohara, MD ; Next Scheduled Visit:  Future Appointments  Date Time Provider Department Center  10/02/2023 10:00 AM COX-PHARMACIST [facility] None  10/10/2023  9:30 AM McCaughan, Dia D, DPM [facility]  01/06/2024  8:40 AM CoxFritzi Mandes, MD [facility] None    Medication Access/Adherence  Current Pharmacy:  [facility] - [place], [place] - [address] [place] [address] Phone: [phone] Fax: [phone]    Diabetes:  Current medications:  Medications tried in the past: stopped tradjenta d/t cost.   Current glucose readings: not able to provide did not have receiver on her in office.  Using CGM freestyle libre 2 meter; testing daily  Patient reports hypoglycemic s/sx including dizziness, shakiness. Patient denies hyperglycemic symptoms including polyuria, polydipsia, polyphagia, nocturia, neuropathy, blurred vision.  Current meal patterns: covered today by RD - Breakfast:  - Lunch  - Supper  - Snacks  - Drinks   Current physical activity: walks dog, will do elliptical at times, might get back into water aerobics   Current medication access support:    Objective:  Lab Results  Component Value Date   HGBA1C 7.9 (H) 06/21/2023    Lab Results  Component Value Date   CREATININE 2.18 (H) 07/08/2023   BUN 46 (H) 07/08/2023   NA 136 07/08/2023   K 4.3 07/08/2023   CL 99 07/08/2023   CO2 24 07/08/2023    Lab Results  Component Value Date   CHOL 181 06/21/2023    HDL 37 (L) 06/21/2023   LDLCALC 105 (H) 06/21/2023   TRIG 228 (H) 06/21/2023   CHOLHDL 4.9 (H) 06/21/2023    Medications Reviewed Today   Medications were not reviewed in this encounter      Assessment/Plan:   Diabetes: - Currently controlled - Reviewed long term cardiovascular and renal outcomes of uncontrolled blood sugar - Reviewed goal A1c, goal fasting, and goal 2 hour post prandial glucose - Recommend to start ozempic 0.25mg  weekly and reduce insulin by 50%  - discussed with PCP and PCP is in agreement with plan.  - Patient denies personal or family history of multiple endocrine neoplasia type 2, medullary thyroid cancer; personal history of pancreatitis or gallbladder disease. - Recommend to check glucose continuously with CGM     Follow Up Plan: telephone check in two weeks, f/u in person scheduled for 10/30/2023  Dahlia Byes, PharmD, BCGP Clinical Pharmacist  [phone]

## 2023-10-03 ENCOUNTER — Other Ambulatory Visit: Payer: Self-pay

## 2023-10-03 MED ORDER — LOSARTAN POTASSIUM 25 MG PO TABS: 25.0000 mg | ORAL_TABLET | Freq: Every day | ORAL | 2 refills | Status: DC

## 2023-10-07 ENCOUNTER — Telehealth: Payer: Self-pay

## 2023-10-07 NOTE — Telephone Encounter (Signed)
 Prior Authorization was done through patient's insurance and given approval for Ozempic 0.25 mg starter dose.

## 2023-10-09 ENCOUNTER — Other Ambulatory Visit (HOSPITAL_COMMUNITY): Payer: Self-pay

## 2023-10-10 ENCOUNTER — Ambulatory Visit: Admitting: Podiatry

## 2023-10-10 ENCOUNTER — Ambulatory Visit (INDEPENDENT_AMBULATORY_CARE_PROVIDER_SITE_OTHER)

## 2023-10-10 ENCOUNTER — Other Ambulatory Visit: Payer: Self-pay | Admitting: Podiatry

## 2023-10-10 DIAGNOSIS — M21611 Bunion of right foot: Secondary | ICD-10-CM | POA: Diagnosis not present

## 2023-10-10 DIAGNOSIS — M21612 Bunion of left foot: Secondary | ICD-10-CM

## 2023-10-10 DIAGNOSIS — M2042 Other hammer toe(s) (acquired), left foot: Secondary | ICD-10-CM | POA: Diagnosis not present

## 2023-10-10 DIAGNOSIS — E1169 Type 2 diabetes mellitus with other specified complication: Secondary | ICD-10-CM

## 2023-10-10 DIAGNOSIS — M2041 Other hammer toe(s) (acquired), right foot: Secondary | ICD-10-CM

## 2023-10-10 NOTE — Progress Notes (Signed)
 Chief Complaint  Patient presents with   Bunions    NP here for bilateral bunions. Xrays up. Had a previous surgeon tell her no to bunion correction due to Med HX.  She would like to get diabetic shoes.  Last A1c was 7.5 2 weks ago and takes plavix.    HPI: 88 y.o. female presents today for evaluation of her diabetic shoes.  She was sent here by her family physician for evaluation.  She has severe bunions bilaterally but states that she was told previously by another surgeon that her health was too risky to proceed with surgery.  She also notes hammertoes.  She has a previous right second toe amputation.  Also notes that she had the right great toenail removed at some point in the past and sometimes gets burning where the nail used to be.  Past Medical History:  Diagnosis Date   Anemia    a. Noted on labs 02/2014 possibly procedurally related.   Asthma    CAD S/P percutaneous coronary angioplasty 01/22/2014   A. (12/2013): POBA - OM2 99% (very tortuous segment) - reduced ~ 50% & TIMI 3 flow, 80-90% stenosis in mid PDA, Irregularities <50% in mRCA, pLAD and prox LCx; b. 02/17/14: Moderate sized fixed defect in Lateral wall --> cath with OM2 restenosis & otherwise stable --> Xience Alpine DES x 2 (2.25 mm x 12 & 8 mm). c. NSTEMI 02/2014 s/p PTCA/balloon angioplasty only to small caliber PDA.   Carotid stenosis    Carotid US 11/17: L 1-39; FU prn   Chronic systolic heart failure (HCC)    a. ICM b. ECHO (01/2014): EF 25-30%, grade I DD, trivial MR. c. EF by Myoview 02/17/14 = ~53%. d. EF by cath 02/24/14 - improved to 50-55%.   CKD (chronic kidney disease), stage III (HCC)    Contrast media allergy    Environmental allergies    GERD (gastroesophageal reflux disease)    Gout, unspecified    HOH (hard of hearing)    Hyperlipidemia    a. H/o intolerance to Zocor, Lipitor. Had muscle aches on higher dose Crestor.   Hypertension    Hypothyroidism    LBBB (left bundle branch block)    a. Transient  during 02/2014 admission.   NSTEMI (non-ST elevated myocardial infarction) (HCC) 2014; 12/2013, 01/2014   QT prolongation    a. Noted on EKG 02/2014.   Sinus bradycardia    a. HR 40s-50s during 02/2014 admission, limiting BB dose.   TIA (transient ischemic attack) 1990's   a. TIA vs stroke 1990's "mild" - sounds like a TIA as she was told she had stroke symptoms but negative scans. Denies residual effects.   Type II diabetes mellitus [facility])     Past Surgical History:  Procedure Laterality Date   ABDOMINAL HYSTERECTOMY     complete   AMPUTATION TOE Right 02/25/2021   hammer toe.   APPENDECTOMY  1959   CARDIAC CATHETERIZATION  02/24/2014   Procedure: CORONARY BALLOON ANGIOPLASTY;  Surgeon: Kathleene Hazel, MD;  Location: [facility] CATH LAB;  Service: Cardiovascular;;   CARDIAC CATHETERIZATION N/A 04/20/2015   Procedure: Left Heart Cath and Coronary Angiography;  Surgeon: Lyn Records, MD;  Location: [facility] INVASIVE CV LAB;  Service: Cardiovascular;  Laterality: N/A;   CATARACT EXTRACTION W/ INTRAOCULAR LENS  IMPLANT, BILATERAL Bilateral 2013   CESAREAN SECTION  1959; 1960   CHOLECYSTECTOMY  1989   CORONARY ANGIOPLASTY  12/2013   POBA - OM2  CORONARY ARTERY BYPASS GRAFT N/A 04/25/2015   Procedure: CORONARY ARTERY BYPASS GRAFTING (CABG) x four,  using left internal mammary artery and right leg greater saphenous vein harvested endoscopically;  Surgeon: Bartley Lightning, MD;  Location: [facility] OR;  Service: Open Heart Surgery;  Laterality: N/A;   JOINT REPLACEMENT Right 10 years ago    right knee (TKR)   LEFT HEART CATHETERIZATION WITH CORONARY ANGIOGRAM N/A 01/22/2014   Procedure: LEFT HEART CATHETERIZATION WITH CORONARY ANGIOGRAM;  Surgeon: Mickiel Albany, MD;  Location: [facility] CATH LAB;  Service: Cardiovascular;  Laterality: N/A;   LEFT HEART CATHETERIZATION WITH CORONARY ANGIOGRAM N/A 02/17/2014   Procedure: LEFT HEART CATHETERIZATION WITH CORONARY ANGIOGRAM;  Surgeon: Odie Benne, MD;   Location: [facility] CATH LAB;  Service: Cardiovascular;  Laterality: N/A;   LEFT HEART CATHETERIZATION WITH CORONARY ANGIOGRAM N/A 02/24/2014   Procedure: LEFT HEART CATHETERIZATION WITH CORONARY ANGIOGRAM;  Surgeon: Odie Benne, MD;  Location: [facility] CATH LAB;  Service: Cardiovascular;  Laterality: N/A;   LUMBAR LAMINECTOMY/DECOMPRESSION MICRODISCECTOMY Left 12/22/2014   Procedure: CENTRAL DECOMPRESSIVE LUMBAR, AND LAMINECTOMY L5-S1,  S1-S2 FOR SPINAL STENOSIS FORAMINOTOMY L5, S1, S2 ROOTS ON LEFT FOR FORAMINAL STENOSIS, AND DECOMPRESSION L5-S1, S1-S2 ACCORDING TO LABELING OF [facility] X-RAYS;  Surgeon: Hazle Lites, MD;  Location: [facility] ORS;  Service: Orthopedics;  Laterality: Left;   PERCUTANEOUS CORONARY STENT INTERVENTION (PCI-S)  02/17/2014   For restenosis of OM2 - PCI with Xience Apline DES 2.25 mm x 12 mm & 2.25 mm x 8 mm overlapping   REDUCTION MAMMAPLASTY Bilateral 1990's   SHOULDER OPEN ROTATOR CUFF REPAIR Bilateral 1980's - 1990's   TEE WITHOUT CARDIOVERSION N/A 04/25/2015   Procedure: TRANSESOPHAGEAL ECHOCARDIOGRAM (TEE);  Surgeon: Bartley Lightning, MD;  Location: [facility] OR;  Service: Open Heart Surgery;  Laterality: N/A;   TOTAL KNEE ARTHROPLASTY Right 2009   TOTAL KNEE ARTHROPLASTY Left 04/21/2018   TKR;  Surgeon: Christie Cox, MD;  Location: [facility] ORS;  Service: Orthopedics;  Laterality: Left;  Adductor Block    Allergies  Allergen Reactions   Iodinated Contrast Media Anaphylaxis and Other (See Comments)    unknown Contrast agent "Makes me shake all over"   Atorvastatin Other (See Comments)    Muscle aches- severe   Corticosteroids Other (See Comments)    Elevated blood sugar     Hydrocodone Nausea And Vomiting   Other Nausea And Vomiting    Pain medications except can tramadol   Oxycodone Nausea And Vomiting   Simvastatin Other (See Comments)    Muscle aches- severe   Cortizone-10 [Hydrocortisone] Other (See Comments)    unknown    PHYSICAL EXAM:  General: The  patient is alert and oriented x3 in no acute distress.  Dermatology: Skin is warm, dry and supple bilateral lower extremities. Interspaces are clear of maceration and debris.  No rashes noted.   Vascular: Palpable pedal pulses bilaterally. Capillary refill within normal limits.  +1 pitting edema bilateral.  Moderate telangiectasias in the lower legs bilateral.  Neurological: Light touch sensation grossly intact bilateral feet.   Musculoskeletal Exam:  There is a bony medial prominence on the dorsomedial aspect of the 1st metatarsal head of the bilateral feet foot.  There is pain on palpation of the "bump" in this area.  Lateral deviation of the hallux at the MPJ level.  1st MPJ ROM is decreased.  No crepitus.  Hallux is not fully reducible bilateral.  History of right second toe amputation.  Contractures of the lesser toes  bilateral at the PIP joint level  RADIOGRAPHIC EXAM (bilateral foot, 3 weightbearing views, 10/10/2023):  Severely increased first intermetatarsal angle bilateral.  Increased hallux abductus angle.  Enlargement of bone at dorsomedial 1st metatarsal head.  Joint space is decreased at first MPJ bilateral.  Contractures of lesser toes at PIPJ's bilateral  ASSESSMENT/PLAN OF CARE: 1. Bilateral bunions   2. Hammertoe of left foot   3. Hammertoe of right foot   4. Type 2 diabetes mellitus with other specified complication, without long-term current use of insulin (HCC)     Discussed patient's condition and possible etiologies today.  Discussed conservative treatment options with patient today, including shoe modification / arch supports, off-loading, cortisone injectione, NSAID topical / oral therapy, and toe splints and shields.    Patient was given gel toe spacers and toe caps to try on the great toe and first interspace to keep the 1st and 2nd toes apart.  Discussed the diabetic shoe program with the patient today.  She is a good candidate for the shoes because she has such as  severe bunion deformity and hammertoe contractures and needs an extra extra-depth shoe to also accommodate the swelling.  Will send in the prior authorization form to [employer].  She was tentatively scheduled with our pedorthist for a diabetic shoe consult at the [place] office in the near future.  Return for schedule a diabetic shoe consult with Kerney Pee in [place].    Joe Murders, DPM, FACFAS [facility]     [address] [place], [place] [address]                Office [phone]  Fax [phone]

## 2023-10-10 NOTE — Assessment & Plan Note (Signed)
Stable  Sees nephrology

## 2023-10-15 ENCOUNTER — Other Ambulatory Visit (HOSPITAL_COMMUNITY): Payer: Self-pay

## 2023-10-16 ENCOUNTER — Other Ambulatory Visit

## 2023-10-16 ENCOUNTER — Telehealth: Payer: Self-pay

## 2023-10-16 DIAGNOSIS — R630 Anorexia: Secondary | ICD-10-CM | POA: Diagnosis not present

## 2023-10-16 DIAGNOSIS — R062 Wheezing: Secondary | ICD-10-CM | POA: Diagnosis not present

## 2023-10-16 DIAGNOSIS — R051 Acute cough: Secondary | ICD-10-CM | POA: Diagnosis not present

## 2023-10-16 DIAGNOSIS — J189 Pneumonia, unspecified organism: Secondary | ICD-10-CM | POA: Diagnosis not present

## 2023-10-16 DIAGNOSIS — R0981 Nasal congestion: Secondary | ICD-10-CM | POA: Diagnosis not present

## 2023-10-16 DIAGNOSIS — K449 Diaphragmatic hernia without obstruction or gangrene: Secondary | ICD-10-CM | POA: Diagnosis not present

## 2023-10-16 DIAGNOSIS — R059 Cough, unspecified: Secondary | ICD-10-CM | POA: Diagnosis not present

## 2023-10-16 DIAGNOSIS — R509 Fever, unspecified: Secondary | ICD-10-CM | POA: Diagnosis not present

## 2023-10-16 MED ORDER — OZEMPIC (0.25 OR 0.5 MG/DOSE) 2 MG/3ML ~~LOC~~ SOPN: PEN_INJECTOR | SUBCUTANEOUS | 1 refills | Status: AC

## 2023-10-16 NOTE — Progress Notes (Deleted)
   10/16/2023 Name: Carol [name] MRN: [id number] DOB: 11-16-1935  No chief complaint on file.   Carol [name] is a 88 y.o. year old female who presented for a telephone visit.   They were referred to the pharmacist by their PCP for assistance in managing diabetes.    Subjective:  Care Team: Primary Care Provider: Mercy Stall, MD ; Next Scheduled Visit: *** {careteamprovider:27366}  Medication Access/Adherence  Current Pharmacy:  [facility] - [place], [place] - [phone] [place] [address] Phone: [phone] Fax: [phone]   Patient reports affordability concerns with their medications: {YES/NO:21197} Patient reports access/transportation concerns to their pharmacy: {YES/NO:21197} Patient reports adherence concerns with their medications:  {YES/NO:21197} ***   {Pharmacy S/O Choices:26420}   Objective:  Lab Results  Component Value Date   HGBA1C 7.5 (H) 09/30/2023    Lab Results  Component Value Date   CREATININE 1.57 (H) 09/30/2023   BUN 34 (H) 09/30/2023   NA 144 09/30/2023   K 4.9 09/30/2023   CL 103 09/30/2023   CO2 25 09/30/2023    Lab Results  Component Value Date   CHOL 156 09/30/2023   HDL 53 09/30/2023   LDLCALC 85 09/30/2023   TRIG 101 09/30/2023   CHOLHDL 2.9 09/30/2023    Medications Reviewed Today   Medications were not reviewed in this encounter       Assessment/Plan:   {Pharmacy A/P Choices:26421}  Follow Up Plan: ***  ***

## 2023-10-16 NOTE — Progress Notes (Signed)
   10/16/2023  Patient ID: Carol [name], female   DOB: 07-09-1935, 88 y.o.   MRN: [id number]  Care Coordination Call  Spoke with patient hoping to review insulin  management and ozempic . PA approved at [place] however they cannot get ozempic .   Pet requesting to be send to [facility] - sent.   Rolando Cliche, PharmD, BCGP Clinical Pharmacist  [phone]

## 2023-10-17 ENCOUNTER — Telehealth: Payer: Self-pay | Admitting: Family Medicine

## 2023-10-17 NOTE — Telephone Encounter (Signed)
 [facility] [phone]

## 2023-10-25 DIAGNOSIS — E1122 Type 2 diabetes mellitus with diabetic chronic kidney disease: Secondary | ICD-10-CM | POA: Diagnosis not present

## 2023-10-30 ENCOUNTER — Other Ambulatory Visit (HOSPITAL_COMMUNITY): Payer: Self-pay

## 2023-10-30 ENCOUNTER — Ambulatory Visit (INDEPENDENT_AMBULATORY_CARE_PROVIDER_SITE_OTHER)

## 2023-10-30 DIAGNOSIS — I130 Hypertensive heart and chronic kidney disease with heart failure and stage 1 through stage 4 chronic kidney disease, or unspecified chronic kidney disease: Secondary | ICD-10-CM

## 2023-10-30 DIAGNOSIS — K219 Gastro-esophageal reflux disease without esophagitis: Secondary | ICD-10-CM

## 2023-10-30 MED ORDER — EZETIMIBE 10 MG PO TABS
10.0000 mg | ORAL_TABLET | Freq: Every day | ORAL | 3 refills | Status: DC
  Filled 2023-10-30: qty 90, 90d supply, fill #0

## 2023-10-30 MED ORDER — CLOPIDOGREL BISULFATE 75 MG PO TABS
75.0000 mg | ORAL_TABLET | Freq: Every day | ORAL | 3 refills | Status: DC
  Filled 2023-10-30: qty 90, 90d supply, fill #0

## 2023-10-30 MED ORDER — PANTOPRAZOLE SODIUM 40 MG PO TBEC
40.0000 mg | DELAYED_RELEASE_TABLET | Freq: Every day | ORAL | 4 refills | Status: DC | PRN
  Filled 2023-10-30: qty 90, 90d supply, fill #0

## 2023-10-30 MED ORDER — CARVEDILOL 6.25 MG PO TABS
6.2500 mg | ORAL_TABLET | Freq: Two times a day (BID) | ORAL | 1 refills | Status: DC
  Filled 2023-10-30: qty 180, 90d supply, fill #0

## 2023-10-30 MED ORDER — LEVOTHYROXINE SODIUM 50 MCG PO TABS
50.0000 ug | ORAL_TABLET | Freq: Every morning | ORAL | 3 refills | Status: DC
  Filled 2023-10-30: qty 90, 90d supply, fill #0

## 2023-10-30 NOTE — Progress Notes (Signed)
 10/30/2023 Name: Carol [name] MRN: [id number] DOB: 12-18-1935  No chief complaint on file.   Carol [name] is a 88 y.o. year old female who was referred for medication management by their primary care provider, Cox, Kirsten, MD. They presented for a face to face visit today.   They were referred to the pharmacist by their PCP for assistance in managing diabetes    Subjective:  Care Team: Primary Care Provider: Mercy Stall, MD ; Next Scheduled Visit: Future Appointments  Date Time Provider Department Center  12/10/2023  4:00 PM T[facility]-ASHE NURSE [facility]  01/06/2024  8:40 AM CoxBurleigh Carp, MD [facility]O None    Medication Access/Adherence  Current Pharmacy:  [facility] [place], [place] - [phone] [place] [address] Phone: [phone] Fax: [phone]  [facility] [address] [place] - [address] [place] [address] Phone: [phone] Fax: [phone]  [place] [facility] [address] [place] [address] Phone: [phone] Fax: [phone]   Patient reports affordability concerns with their medications: No  Patient reports access/transportation concerns to their pharmacy: No  Patient reports adherence concerns with their medications:  No     Diabetes:  Current medications:  Medications tried in the past:   Current glucose readings: 150-300 Using libre CGM; testing continuously. Unfortunately forget to bring reader. Does not want hooked up to her phone at this time.  Patient denies hypoglycemic s/sx including dizziness, shakiness, sweating. Patient denies hyperglycemic symptoms including polyuria, polydipsia, polyphagia, nocturia, neuropathy, blurred vision.  Current meal patterns: denies eating much lately do to recent PNA, which she is getting over.  Current physical activity: limited  Current medication access support: none required for DM meds. Has  HTA CSNP DM Heart   Objective:  Lab Results  Component Value Date   HGBA1C 7.5 (H) 09/30/2023    Lab Results  Component Value Date   CREATININE 1.57 (H) 09/30/2023   BUN 34 (H) 09/30/2023   NA 144 09/30/2023   K 4.9 09/30/2023   CL 103 09/30/2023   CO2 25 09/30/2023    Lab Results  Component Value Date   CHOL 156 09/30/2023   HDL 53 09/30/2023   LDLCALC 85 09/30/2023   TRIG 101 09/30/2023   CHOLHDL 2.9 09/30/2023    Medications Reviewed Today   Medications were not reviewed in this encounter      Assessment/Plan:   Diabetes: - Currently controlled - Reviewed long term cardiovascular and renal outcomes of uncontrolled blood sugar - Reviewed goal A1c, goal fasting, and goal 2 hour post prandial glucose - Reviewed dietary modifications including low carb diet - Reviewed lifestyle modifications including:  - Recommend to exercise as safely tolerated  - Patient denies personal or family history of multiple endocrine neoplasia type 2, medullary thyroid  cancer; personal history of pancreatitis or gallbladder disease. - Recommend to check glucose continuously with CGM   Medication Management: - Currently strategy sufficient to maintain appropriate adherence to prescribed medication regimen - Suggested use of weekly pill box to organize medications - Created list of medication, indication, and administration time. Provided to patient -- Patient wishes to try mail order pharmacy through insurance - [facility] preferred, will try [facility] out patient, starting with carvedilol , levothyroxine , pantoprazole , clopidogrel , ezetimibe .     Follow Up Plan: 2 wk telephone call. Just started ozempic  Monday5/5, understands insulin  instructions to reduce to  15 units daily, will stop and let office know if experiencing any lows.   Rolando Cliche, PharmD, BCGP Clinical Pharmacist  [phone]

## 2023-11-15 ENCOUNTER — Other Ambulatory Visit: Payer: Self-pay

## 2023-11-15 NOTE — Progress Notes (Unsigned)
   11/15/2023 Name: Carol [name] MRN: [id number] DOB: 02-Nov-1935  Chief Complaint  Patient presents with   Medication Management   Diabetes    {Visit Type:26650}   Subjective:  Care Team: Primary Care Provider: Mercy Stall, MD ; Next Scheduled Visit: *** {careteamprovider:27366}  Medication Access/Adherence  Current Pharmacy:  [facility] [place], [place] - [phone] [place] [address] Phone: [phone] Fax: [phone]  [facility] [address], [place] - [address] [place] [address] Phone: [phone] Fax: [phone]   - [facility] [address] [place] [address] Phone: [phone] Fax: [phone]   Patient reports affordability concerns with their medications: No  Patient reports access/transportation concerns to their pharmacy: No  Patient reports adherence concerns with their medications:  No     Diabetes:  Current medications: jardiance  25mg , novolin 70/30 15 units daily, ozempic  0.25mg  once weekly (will stop d/t GI side effects) Medications tried in the past:   Current glucose readings: around 200 or less. At time of call was 184, this was after 70/30 injection and eating a slice of toast w/ margarine.   Date of Download: unable to access CGM information. Observed patterns:  Patient denies hypoglycemic s/sx including dizziness, shakiness, sweating. Patient denies hyperglycemic symptoms including polyuria, polydipsia, polyphagia, nocturia, neuropathy, blurred vision.  Current meal patterns: has been struggling to eat since getting PNA last month. Experienced extreme GI symptoms while starting ozempic , states would be her 4th dose this AM but is opting to not take and will go back to her insulin  injections.   Previously discussed the need to bring in all medications to do comprehensive review however previous two visits meds and CGM reader left at  home.   Objective:  Lab Results  Component Value Date   HGBA1C 7.5 (H) 09/30/2023     Lab Results  Component Value Date   CREATININE 1.57 (H) 09/30/2023   BUN 34 (H) 09/30/2023   NA 144 09/30/2023   K 4.9 09/30/2023   CL 103 09/30/2023   CO2 25 09/30/2023    Lab Results  Component Value Date   CHOL 156 09/30/2023   HDL 53 09/30/2023   LDLCALC 85 09/30/2023   TRIG 101 09/30/2023   CHOLHDL 2.9 09/30/2023    Medications Reviewed Today   Medications were not reviewed in this encounter     Assessment/Plan:   Diabetes: - Currently controlled - Reviewed long term cardiovascular and renal outcomes of uncontrolled blood sugar - Reviewed goal A1c, goal fasting, and goal 2 hour post prandial glucose - Reviewed dietary modifications including *** - Reviewed lifestyle modifications including:  - Recommend to ***  - Patient denies personal or family history of multiple endocrine neoplasia type 2, medullary thyroid  cancer; personal history of pancreatitis or gallbladder disease. - Recommend to check glucose *** - Meets financial criteria for *** patient assistance program through ***. Will collaborate with provider, CPhT, and patient to pursue assistance.     Follow Up Plan: patient to stop ozempic  d/t her concern over GI side effects. Will continue on her novolin 70/30 15-30 units once daily. Will check in again in two weeks. Might consider conversion to basal insulin  at that time.   ***

## 2023-11-22 ENCOUNTER — Other Ambulatory Visit: Payer: Self-pay

## 2023-11-23 DIAGNOSIS — S72002D Fracture of unspecified part of neck of left femur, subsequent encounter for closed fracture with routine healing: Secondary | ICD-10-CM | POA: Diagnosis not present

## 2023-11-23 DIAGNOSIS — Z955 Presence of coronary angioplasty implant and graft: Secondary | ICD-10-CM | POA: Diagnosis not present

## 2023-11-23 DIAGNOSIS — N183 Chronic kidney disease, stage 3 unspecified: Secondary | ICD-10-CM | POA: Diagnosis not present

## 2023-11-23 DIAGNOSIS — R9431 Abnormal electrocardiogram [ECG] [EKG]: Secondary | ICD-10-CM | POA: Diagnosis not present

## 2023-11-23 DIAGNOSIS — S76002A Unspecified injury of muscle, fascia and tendon of left hip, initial encounter: Secondary | ICD-10-CM | POA: Diagnosis not present

## 2023-11-23 DIAGNOSIS — W19XXXD Unspecified fall, subsequent encounter: Secondary | ICD-10-CM | POA: Diagnosis not present

## 2023-11-23 DIAGNOSIS — M810 Age-related osteoporosis without current pathological fracture: Secondary | ICD-10-CM | POA: Diagnosis not present

## 2023-11-23 DIAGNOSIS — I129 Hypertensive chronic kidney disease with stage 1 through stage 4 chronic kidney disease, or unspecified chronic kidney disease: Secondary | ICD-10-CM | POA: Diagnosis not present

## 2023-11-23 DIAGNOSIS — Z7982 Long term (current) use of aspirin: Secondary | ICD-10-CM | POA: Diagnosis not present

## 2023-11-23 DIAGNOSIS — I444 Left anterior fascicular block: Secondary | ICD-10-CM | POA: Diagnosis not present

## 2023-11-23 DIAGNOSIS — N179 Acute kidney failure, unspecified: Secondary | ICD-10-CM | POA: Diagnosis not present

## 2023-11-23 DIAGNOSIS — S72002A Fracture of unspecified part of neck of left femur, initial encounter for closed fracture: Secondary | ICD-10-CM | POA: Diagnosis not present

## 2023-11-23 DIAGNOSIS — Z96642 Presence of left artificial hip joint: Secondary | ICD-10-CM | POA: Diagnosis not present

## 2023-11-23 DIAGNOSIS — Z043 Encounter for examination and observation following other accident: Secondary | ICD-10-CM | POA: Diagnosis not present

## 2023-11-23 DIAGNOSIS — I2510 Atherosclerotic heart disease of native coronary artery without angina pectoris: Secondary | ICD-10-CM | POA: Diagnosis not present

## 2023-11-23 DIAGNOSIS — M1990 Unspecified osteoarthritis, unspecified site: Secondary | ICD-10-CM | POA: Diagnosis not present

## 2023-11-23 DIAGNOSIS — Z951 Presence of aortocoronary bypass graft: Secondary | ICD-10-CM | POA: Diagnosis not present

## 2023-11-23 DIAGNOSIS — Z794 Long term (current) use of insulin: Secondary | ICD-10-CM | POA: Diagnosis not present

## 2023-11-23 DIAGNOSIS — D631 Anemia in chronic kidney disease: Secondary | ICD-10-CM | POA: Diagnosis not present

## 2023-11-23 DIAGNOSIS — N390 Urinary tract infection, site not specified: Secondary | ICD-10-CM | POA: Diagnosis not present

## 2023-11-23 DIAGNOSIS — I10 Essential (primary) hypertension: Secondary | ICD-10-CM | POA: Diagnosis not present

## 2023-11-23 DIAGNOSIS — D62 Acute posthemorrhagic anemia: Secondary | ICD-10-CM | POA: Diagnosis not present

## 2023-11-23 DIAGNOSIS — M7989 Other specified soft tissue disorders: Secondary | ICD-10-CM | POA: Diagnosis not present

## 2023-11-23 DIAGNOSIS — I498 Other specified cardiac arrhythmias: Secondary | ICD-10-CM | POA: Diagnosis not present

## 2023-11-23 DIAGNOSIS — Z79899 Other long term (current) drug therapy: Secondary | ICD-10-CM | POA: Diagnosis not present

## 2023-11-23 DIAGNOSIS — M6281 Muscle weakness (generalized): Secondary | ICD-10-CM | POA: Diagnosis not present

## 2023-11-23 DIAGNOSIS — S51012A Laceration without foreign body of left elbow, initial encounter: Secondary | ICD-10-CM | POA: Diagnosis not present

## 2023-11-23 DIAGNOSIS — S72012A Unspecified intracapsular fracture of left femur, initial encounter for closed fracture: Secondary | ICD-10-CM | POA: Diagnosis not present

## 2023-11-23 DIAGNOSIS — Z792 Long term (current) use of antibiotics: Secondary | ICD-10-CM | POA: Diagnosis not present

## 2023-11-23 DIAGNOSIS — E785 Hyperlipidemia, unspecified: Secondary | ICD-10-CM | POA: Diagnosis not present

## 2023-11-23 DIAGNOSIS — R2681 Unsteadiness on feet: Secondary | ICD-10-CM | POA: Diagnosis not present

## 2023-11-23 DIAGNOSIS — M25552 Pain in left hip: Secondary | ICD-10-CM | POA: Diagnosis not present

## 2023-11-23 DIAGNOSIS — Z01818 Encounter for other preprocedural examination: Secondary | ICD-10-CM | POA: Diagnosis not present

## 2023-11-23 DIAGNOSIS — R404 Transient alteration of awareness: Secondary | ICD-10-CM | POA: Diagnosis not present

## 2023-11-23 DIAGNOSIS — I252 Old myocardial infarction: Secondary | ICD-10-CM | POA: Diagnosis not present

## 2023-11-23 DIAGNOSIS — E875 Hyperkalemia: Secondary | ICD-10-CM | POA: Diagnosis not present

## 2023-11-23 DIAGNOSIS — E1140 Type 2 diabetes mellitus with diabetic neuropathy, unspecified: Secondary | ICD-10-CM | POA: Diagnosis not present

## 2023-11-23 DIAGNOSIS — K219 Gastro-esophageal reflux disease without esophagitis: Secondary | ICD-10-CM | POA: Diagnosis not present

## 2023-11-23 DIAGNOSIS — W19XXXA Unspecified fall, initial encounter: Secondary | ICD-10-CM | POA: Diagnosis not present

## 2023-11-23 DIAGNOSIS — S51002D Unspecified open wound of left elbow, subsequent encounter: Secondary | ICD-10-CM | POA: Diagnosis not present

## 2023-11-23 DIAGNOSIS — S51002A Unspecified open wound of left elbow, initial encounter: Secondary | ICD-10-CM | POA: Diagnosis not present

## 2023-11-23 DIAGNOSIS — E039 Hypothyroidism, unspecified: Secondary | ICD-10-CM | POA: Diagnosis not present

## 2023-11-23 DIAGNOSIS — E1122 Type 2 diabetes mellitus with diabetic chronic kidney disease: Secondary | ICD-10-CM | POA: Diagnosis not present

## 2023-11-23 DIAGNOSIS — N1832 Chronic kidney disease, stage 3b: Secondary | ICD-10-CM | POA: Diagnosis not present

## 2023-11-23 DIAGNOSIS — M109 Gout, unspecified: Secondary | ICD-10-CM | POA: Diagnosis not present

## 2023-11-23 DIAGNOSIS — Z7902 Long term (current) use of antithrombotics/antiplatelets: Secondary | ICD-10-CM | POA: Diagnosis not present

## 2023-11-23 DIAGNOSIS — S5002XA Contusion of left elbow, initial encounter: Secondary | ICD-10-CM | POA: Diagnosis not present

## 2023-11-24 DIAGNOSIS — S72002A Fracture of unspecified part of neck of left femur, initial encounter for closed fracture: Secondary | ICD-10-CM | POA: Diagnosis not present

## 2023-11-24 DIAGNOSIS — Z96642 Presence of left artificial hip joint: Secondary | ICD-10-CM | POA: Diagnosis not present

## 2023-11-24 DIAGNOSIS — S72012A Unspecified intracapsular fracture of left femur, initial encounter for closed fracture: Secondary | ICD-10-CM | POA: Diagnosis not present

## 2023-11-24 DIAGNOSIS — I10 Essential (primary) hypertension: Secondary | ICD-10-CM | POA: Diagnosis not present

## 2023-11-25 DIAGNOSIS — E1122 Type 2 diabetes mellitus with diabetic chronic kidney disease: Secondary | ICD-10-CM | POA: Diagnosis not present

## 2023-11-26 ENCOUNTER — Telehealth: Payer: Self-pay

## 2023-11-26 ENCOUNTER — Other Ambulatory Visit: Payer: Self-pay

## 2023-11-26 NOTE — Progress Notes (Signed)
   11/26/2023  Patient ID: Carol [name], female   DOB: [birth year], 88 y.o.   MRN: [id number]  Spoke to patient regarding DM f/u, given not being able to tolerate ozempic  and continuing with novolin 70/30.   Visit cancelled - states she was currently in the hospital d/t falling over lose rug and fractured her hip. Plans to go to [facility] as next step.   Future Appointments  Date Time Provider Department Center  12/09/2023 11:00 AM Rolando Cliche, [facility] None  12/10/2023  4:00 PM [facility]URSE [facility]  01/06/2024  8:40 AM Cox, Burleigh Carp, MD [facility] None   Rolando Cliche, PharmD, BCGP Clinical Pharmacist  [phone]

## 2023-11-28 DIAGNOSIS — K219 Gastro-esophageal reflux disease without esophagitis: Secondary | ICD-10-CM | POA: Diagnosis not present

## 2023-11-28 DIAGNOSIS — E1140 Type 2 diabetes mellitus with diabetic neuropathy, unspecified: Secondary | ICD-10-CM | POA: Diagnosis not present

## 2023-11-28 DIAGNOSIS — S51002D Unspecified open wound of left elbow, subsequent encounter: Secondary | ICD-10-CM | POA: Diagnosis not present

## 2023-11-28 DIAGNOSIS — S72002D Fracture of unspecified part of neck of left femur, subsequent encounter for closed fracture with routine healing: Secondary | ICD-10-CM | POA: Diagnosis not present

## 2023-11-28 DIAGNOSIS — I10 Essential (primary) hypertension: Secondary | ICD-10-CM | POA: Diagnosis not present

## 2023-11-28 DIAGNOSIS — E039 Hypothyroidism, unspecified: Secondary | ICD-10-CM | POA: Diagnosis not present

## 2023-11-28 DIAGNOSIS — M6281 Muscle weakness (generalized): Secondary | ICD-10-CM | POA: Diagnosis not present

## 2023-11-28 DIAGNOSIS — N390 Urinary tract infection, site not specified: Secondary | ICD-10-CM | POA: Diagnosis not present

## 2023-11-28 DIAGNOSIS — E785 Hyperlipidemia, unspecified: Secondary | ICD-10-CM | POA: Diagnosis not present

## 2023-11-28 DIAGNOSIS — N183 Chronic kidney disease, stage 3 unspecified: Secondary | ICD-10-CM | POA: Diagnosis not present

## 2023-11-28 DIAGNOSIS — N179 Acute kidney failure, unspecified: Secondary | ICD-10-CM | POA: Diagnosis not present

## 2023-11-28 DIAGNOSIS — Z96642 Presence of left artificial hip joint: Secondary | ICD-10-CM | POA: Diagnosis not present

## 2023-11-28 DIAGNOSIS — R262 Difficulty in walking, not elsewhere classified: Secondary | ICD-10-CM | POA: Diagnosis not present

## 2023-11-28 DIAGNOSIS — D62 Acute posthemorrhagic anemia: Secondary | ICD-10-CM | POA: Diagnosis not present

## 2023-11-28 DIAGNOSIS — W19XXXD Unspecified fall, subsequent encounter: Secondary | ICD-10-CM | POA: Diagnosis not present

## 2023-11-28 DIAGNOSIS — N1832 Chronic kidney disease, stage 3b: Secondary | ICD-10-CM | POA: Diagnosis not present

## 2023-11-28 DIAGNOSIS — G8918 Other acute postprocedural pain: Secondary | ICD-10-CM | POA: Diagnosis not present

## 2023-11-28 DIAGNOSIS — R2681 Unsteadiness on feet: Secondary | ICD-10-CM | POA: Diagnosis not present

## 2023-11-28 DIAGNOSIS — I2510 Atherosclerotic heart disease of native coronary artery without angina pectoris: Secondary | ICD-10-CM | POA: Diagnosis not present

## 2023-11-28 DIAGNOSIS — M810 Age-related osteoporosis without current pathological fracture: Secondary | ICD-10-CM | POA: Diagnosis not present

## 2023-11-29 DIAGNOSIS — R262 Difficulty in walking, not elsewhere classified: Secondary | ICD-10-CM | POA: Diagnosis not present

## 2023-11-29 DIAGNOSIS — D62 Acute posthemorrhagic anemia: Secondary | ICD-10-CM | POA: Diagnosis not present

## 2023-11-29 DIAGNOSIS — G8918 Other acute postprocedural pain: Secondary | ICD-10-CM | POA: Diagnosis not present

## 2023-11-29 DIAGNOSIS — S72002D Fracture of unspecified part of neck of left femur, subsequent encounter for closed fracture with routine healing: Secondary | ICD-10-CM | POA: Diagnosis not present

## 2023-11-29 HISTORY — PX: TOTAL HIP ARTHROPLASTY: SHX124

## 2023-12-06 ENCOUNTER — Telehealth: Payer: Self-pay

## 2023-12-06 ENCOUNTER — Other Ambulatory Visit: Payer: Self-pay | Admitting: Family Medicine

## 2023-12-06 DIAGNOSIS — E1122 Type 2 diabetes mellitus with diabetic chronic kidney disease: Secondary | ICD-10-CM

## 2023-12-06 MED ORDER — FREESTYLE LIBRE 2 SENSOR MISC
1.0000 | 3 refills | Status: DC
Start: 2023-12-06 — End: 2023-12-26

## 2023-12-06 NOTE — Telephone Encounter (Signed)
 Returned call to Amy, left message to call our office.  Copied from CRM [id number]. Topic: Clinical - Medication Prior Auth >> Dec 06, 2023 10:57 AM Rosaria Common wrote: Reason for CRM: Amy calling from [employer] for the pt's CMG's diabetic sensors request from out of network provider with no out of network benefits. Callback number is [phone].

## 2023-12-09 ENCOUNTER — Other Ambulatory Visit: Payer: Self-pay

## 2023-12-09 ENCOUNTER — Telehealth: Payer: Self-pay

## 2023-12-09 NOTE — Progress Notes (Signed)
   12/09/2023  Patient ID: Carol [name], female   DOB: 1936-06-14, 88 y.o.   MRN: [id number]  Attempted to contact patient for scheduled appointment for medication management. Wanted to check on patient/BG. From what I understand she may be in SNF, I did not have anything urgent to address. Patient can reach out at a later date if needing DM assistance.   Rolando Cliche, PharmD, BCGP Clinical Pharmacist  [phone]

## 2023-12-10 ENCOUNTER — Other Ambulatory Visit

## 2023-12-14 DIAGNOSIS — J45909 Unspecified asthma, uncomplicated: Secondary | ICD-10-CM | POA: Diagnosis not present

## 2023-12-14 DIAGNOSIS — Z955 Presence of coronary angioplasty implant and graft: Secondary | ICD-10-CM | POA: Diagnosis not present

## 2023-12-14 DIAGNOSIS — M19022 Primary osteoarthritis, left elbow: Secondary | ICD-10-CM | POA: Diagnosis not present

## 2023-12-14 DIAGNOSIS — I129 Hypertensive chronic kidney disease with stage 1 through stage 4 chronic kidney disease, or unspecified chronic kidney disease: Secondary | ICD-10-CM | POA: Diagnosis not present

## 2023-12-14 DIAGNOSIS — Z96642 Presence of left artificial hip joint: Secondary | ICD-10-CM | POA: Diagnosis not present

## 2023-12-14 DIAGNOSIS — E1122 Type 2 diabetes mellitus with diabetic chronic kidney disease: Secondary | ICD-10-CM | POA: Diagnosis not present

## 2023-12-14 DIAGNOSIS — M109 Gout, unspecified: Secondary | ICD-10-CM | POA: Diagnosis not present

## 2023-12-14 DIAGNOSIS — Z951 Presence of aortocoronary bypass graft: Secondary | ICD-10-CM | POA: Diagnosis not present

## 2023-12-14 DIAGNOSIS — N179 Acute kidney failure, unspecified: Secondary | ICD-10-CM | POA: Diagnosis not present

## 2023-12-14 DIAGNOSIS — Z7982 Long term (current) use of aspirin: Secondary | ICD-10-CM | POA: Diagnosis not present

## 2023-12-14 DIAGNOSIS — E785 Hyperlipidemia, unspecified: Secondary | ICD-10-CM | POA: Diagnosis not present

## 2023-12-14 DIAGNOSIS — Z89429 Acquired absence of other toe(s), unspecified side: Secondary | ICD-10-CM | POA: Diagnosis not present

## 2023-12-14 DIAGNOSIS — K219 Gastro-esophageal reflux disease without esophagitis: Secondary | ICD-10-CM | POA: Diagnosis not present

## 2023-12-14 DIAGNOSIS — D631 Anemia in chronic kidney disease: Secondary | ICD-10-CM | POA: Diagnosis not present

## 2023-12-14 DIAGNOSIS — E039 Hypothyroidism, unspecified: Secondary | ICD-10-CM | POA: Diagnosis not present

## 2023-12-14 DIAGNOSIS — N1832 Chronic kidney disease, stage 3b: Secondary | ICD-10-CM | POA: Diagnosis not present

## 2023-12-14 DIAGNOSIS — I2510 Atherosclerotic heart disease of native coronary artery without angina pectoris: Secondary | ICD-10-CM | POA: Diagnosis not present

## 2023-12-14 DIAGNOSIS — Z7902 Long term (current) use of antithrombotics/antiplatelets: Secondary | ICD-10-CM | POA: Diagnosis not present

## 2023-12-14 DIAGNOSIS — M519 Unspecified thoracic, thoracolumbar and lumbosacral intervertebral disc disorder: Secondary | ICD-10-CM | POA: Diagnosis not present

## 2023-12-14 DIAGNOSIS — I252 Old myocardial infarction: Secondary | ICD-10-CM | POA: Diagnosis not present

## 2023-12-14 DIAGNOSIS — M80052D Age-related osteoporosis with current pathological fracture, left femur, subsequent encounter for fracture with routine healing: Secondary | ICD-10-CM | POA: Diagnosis not present

## 2023-12-14 DIAGNOSIS — E1140 Type 2 diabetes mellitus with diabetic neuropathy, unspecified: Secondary | ICD-10-CM | POA: Diagnosis not present

## 2023-12-14 DIAGNOSIS — K5790 Diverticulosis of intestine, part unspecified, without perforation or abscess without bleeding: Secondary | ICD-10-CM | POA: Diagnosis not present

## 2023-12-14 DIAGNOSIS — D62 Acute posthemorrhagic anemia: Secondary | ICD-10-CM | POA: Diagnosis not present

## 2023-12-16 ENCOUNTER — Telehealth: Payer: Self-pay

## 2023-12-16 NOTE — Telephone Encounter (Unsigned)
 Copied from CRM [id number]. Topic: Clinical - Home Health Verbal Orders >> Dec 16, 2023  2:16 PM Rosaria BRAVO wrote: Caller/Agency: Anselm Hedda Rushing Number: [phone] Service Requested: Occupational Therapy Frequency:   1w8  Any new concerns about the patient? Yes  She is having nose bleeds since last Thursday. Was advised to make an appt with PCP.

## 2023-12-16 NOTE — Telephone Encounter (Signed)
 Copied from CRM [id number]. Topic: Appointments - Appointment Info/Confirmation >> Dec 16, 2023  3:23 PM Tiffany S wrote: Patient/patient representative is calling for information regarding an appointment.   Patient needs acute visit for nose bleed and follow up on broken hip

## 2023-12-16 NOTE — Telephone Encounter (Signed)
 I contacted the pt by phone to see about getting her an appointment. The pt was discharged from a rehab facility recently and was wanting to make a follow-up appointment/ I asked the pt about her nose bleed, the pt said that she is unable to get it to stop. The call was transferred to [facility], CMA to triage.

## 2023-12-16 NOTE — Telephone Encounter (Signed)
 Talked with patient and explained her to go to ED for her nose bleeding. She is taking plavix . She states that her nose has been bleeding since 5 days and she will go to ED until tomorrow because her daughter cannot take her today. I recommended to go today to [facility] or [facility] but she said she will wait until tomorrow.  I explained that is very important to be seen and they will cauterize and pack it.  She verbalized to understand.

## 2023-12-17 ENCOUNTER — Ambulatory Visit: Payer: Self-pay

## 2023-12-17 NOTE — Telephone Encounter (Signed)
 Pt states that her nose bleeds stopped yesterday. Pt states that she has an appointment for July, she understands that it is more than the recommended time to be seen, pt states that she only wants to see her doctor. Pt does not have any concerns at this time. Denies dizziness, SOB.

## 2023-12-20 ENCOUNTER — Telehealth: Payer: Self-pay | Admitting: Family Medicine

## 2023-12-20 NOTE — Telephone Encounter (Signed)
 [facility] HOME HEALTH CLIENT COORDINATION NOTE REPORT  12/18/23

## 2023-12-23 ENCOUNTER — Other Ambulatory Visit: Payer: Self-pay | Admitting: Family Medicine

## 2023-12-24 ENCOUNTER — Telehealth: Payer: Self-pay | Admitting: Family Medicine

## 2023-12-24 DIAGNOSIS — E039 Hypothyroidism, unspecified: Secondary | ICD-10-CM | POA: Diagnosis not present

## 2023-12-24 DIAGNOSIS — E1122 Type 2 diabetes mellitus with diabetic chronic kidney disease: Secondary | ICD-10-CM | POA: Diagnosis not present

## 2023-12-24 DIAGNOSIS — D62 Acute posthemorrhagic anemia: Secondary | ICD-10-CM | POA: Diagnosis not present

## 2023-12-24 DIAGNOSIS — Z96642 Presence of left artificial hip joint: Secondary | ICD-10-CM | POA: Diagnosis not present

## 2023-12-24 DIAGNOSIS — E1140 Type 2 diabetes mellitus with diabetic neuropathy, unspecified: Secondary | ICD-10-CM | POA: Diagnosis not present

## 2023-12-24 DIAGNOSIS — N1832 Chronic kidney disease, stage 3b: Secondary | ICD-10-CM | POA: Diagnosis not present

## 2023-12-24 DIAGNOSIS — K219 Gastro-esophageal reflux disease without esophagitis: Secondary | ICD-10-CM | POA: Diagnosis not present

## 2023-12-24 DIAGNOSIS — D631 Anemia in chronic kidney disease: Secondary | ICD-10-CM | POA: Diagnosis not present

## 2023-12-24 DIAGNOSIS — M80052D Age-related osteoporosis with current pathological fracture, left femur, subsequent encounter for fracture with routine healing: Secondary | ICD-10-CM | POA: Diagnosis not present

## 2023-12-24 DIAGNOSIS — N179 Acute kidney failure, unspecified: Secondary | ICD-10-CM | POA: Diagnosis not present

## 2023-12-24 DIAGNOSIS — I129 Hypertensive chronic kidney disease with stage 1 through stage 4 chronic kidney disease, or unspecified chronic kidney disease: Secondary | ICD-10-CM | POA: Diagnosis not present

## 2023-12-24 DIAGNOSIS — I2510 Atherosclerotic heart disease of native coronary artery without angina pectoris: Secondary | ICD-10-CM | POA: Diagnosis not present

## 2023-12-24 NOTE — Telephone Encounter (Signed)
 [facility] [phone]

## 2023-12-26 ENCOUNTER — Other Ambulatory Visit: Payer: Self-pay | Admitting: Family Medicine

## 2023-12-26 ENCOUNTER — Telehealth: Payer: Self-pay

## 2023-12-26 DIAGNOSIS — K219 Gastro-esophageal reflux disease without esophagitis: Secondary | ICD-10-CM

## 2023-12-26 DIAGNOSIS — E1122 Type 2 diabetes mellitus with diabetic chronic kidney disease: Secondary | ICD-10-CM

## 2023-12-26 DIAGNOSIS — E1140 Type 2 diabetes mellitus with diabetic neuropathy, unspecified: Secondary | ICD-10-CM

## 2023-12-26 DIAGNOSIS — I2510 Atherosclerotic heart disease of native coronary artery without angina pectoris: Secondary | ICD-10-CM

## 2023-12-26 DIAGNOSIS — I5032 Chronic diastolic (congestive) heart failure: Secondary | ICD-10-CM

## 2023-12-26 DIAGNOSIS — I130 Hypertensive heart and chronic kidney disease with heart failure and stage 1 through stage 4 chronic kidney disease, or unspecified chronic kidney disease: Secondary | ICD-10-CM

## 2023-12-26 NOTE — Telephone Encounter (Signed)
 Called Patient. I asked to recheck her blood pressure at home.  Her blood pressure was 135/60 in one arm and 159/60 in the other arm. She denied any chest pain, blood pressure, dizziness, lightheaded, tinnitus. Talked with Nola Angles, PA-C and he recommended to check her blood pressure every day and write it down. Bring the blood pressure log in her next appointment with Dr Sherre on 01/06/24.  She was told her to call us  back if her blood pressure still high, and go to ED if she has any symptoms with the high blood pressure.   She verbalized to understand. She was surprise her blood pressure 160/60 because her blood pressure usually is normal.  Copied from CRM [id number]. Topic: Clinical - Home Health Verbal Orders >> Dec 26, 2023  1:25 PM Powell HERO wrote: Caller/Agency: Joseph/ [facility]  Callback Number: [phone] Service Requested: N/A Frequency: N/A  Any new concerns about the patient? Calling to report 160/60 was patients blood pressure 1 hr ago.

## 2023-12-30 ENCOUNTER — Other Ambulatory Visit: Payer: Self-pay

## 2023-12-30 ENCOUNTER — Other Ambulatory Visit: Payer: Self-pay | Admitting: Family Medicine

## 2023-12-30 DIAGNOSIS — I130 Hypertensive heart and chronic kidney disease with heart failure and stage 1 through stage 4 chronic kidney disease, or unspecified chronic kidney disease: Secondary | ICD-10-CM

## 2023-12-30 DIAGNOSIS — E1140 Type 2 diabetes mellitus with diabetic neuropathy, unspecified: Secondary | ICD-10-CM

## 2023-12-30 DIAGNOSIS — I2510 Atherosclerotic heart disease of native coronary artery without angina pectoris: Secondary | ICD-10-CM

## 2023-12-30 MED ORDER — CARVEDILOL 6.25 MG PO TABS: 6.2500 mg | ORAL_TABLET | Freq: Two times a day (BID) | ORAL | 1 refills | Status: DC

## 2023-12-30 MED ORDER — GABAPENTIN 300 MG PO CAPS: 300.0000 mg | ORAL_CAPSULE | Freq: Two times a day (BID) | ORAL | 1 refills | Status: DC

## 2023-12-30 MED ORDER — INSULIN NPH ISOPHANE & REGULAR (70-30) 100 UNIT/ML ~~LOC~~ SUSP: 25.0000 [IU] | Freq: Every day | SUBCUTANEOUS | 2 refills | Status: DC

## 2023-12-30 MED ORDER — COLCHICINE 0.6 MG PO TABS: 0.6000 mg | ORAL_TABLET | Freq: Two times a day (BID) | ORAL | 1 refills | Status: DC | PRN

## 2023-12-30 MED ORDER — CARVEDILOL 6.25 MG PO TABS
6.2500 mg | ORAL_TABLET | Freq: Two times a day (BID) | ORAL | 1 refills | Status: DC
Start: 2023-12-30 — End: 2024-01-06

## 2023-12-30 MED ORDER — NITROGLYCERIN 0.4 MG SL SUBL: 0.4000 mg | SUBLINGUAL_TABLET | SUBLINGUAL | 11 refills | Status: AC | PRN

## 2023-12-31 ENCOUNTER — Telehealth: Payer: Self-pay | Admitting: Family Medicine

## 2023-12-31 NOTE — Telephone Encounter (Signed)
 [facility] Prescription Request

## 2024-01-01 ENCOUNTER — Telehealth: Payer: Self-pay | Admitting: Family Medicine

## 2024-01-01 ENCOUNTER — Other Ambulatory Visit: Payer: Self-pay

## 2024-01-01 DIAGNOSIS — E1140 Type 2 diabetes mellitus with diabetic neuropathy, unspecified: Secondary | ICD-10-CM

## 2024-01-01 DIAGNOSIS — N184 Chronic kidney disease, stage 4 (severe): Secondary | ICD-10-CM

## 2024-01-01 DIAGNOSIS — I5032 Chronic diastolic (congestive) heart failure: Secondary | ICD-10-CM

## 2024-01-01 DIAGNOSIS — I2510 Atherosclerotic heart disease of native coronary artery without angina pectoris: Secondary | ICD-10-CM

## 2024-01-01 DIAGNOSIS — E782 Mixed hyperlipidemia: Secondary | ICD-10-CM

## 2024-01-01 DIAGNOSIS — K219 Gastro-esophageal reflux disease without esophagitis: Secondary | ICD-10-CM

## 2024-01-01 MED ORDER — FUROSEMIDE 80 MG PO TABS
ORAL_TABLET | ORAL | 1 refills | Status: DC
Start: 2024-01-01 — End: 2024-01-08

## 2024-01-01 MED ORDER — RANOLAZINE ER 500 MG PO TB12: 500.0000 mg | ORAL_TABLET | Freq: Two times a day (BID) | ORAL | 1 refills | Status: DC

## 2024-01-01 MED ORDER — ALLOPURINOL 300 MG PO TABS: 300.0000 mg | ORAL_TABLET | Freq: Every day | ORAL | 1 refills | Status: DC

## 2024-01-01 MED ORDER — FREESTYLE LIBRE 2 SENSOR MISC: 1.0000 | 1 refills | Status: DC

## 2024-01-01 MED ORDER — FUROSEMIDE 40 MG PO TABS: 40.0000 mg | ORAL_TABLET | Freq: Every day | ORAL | 1 refills | Status: DC

## 2024-01-01 MED ORDER — LEVOTHYROXINE SODIUM 50 MCG PO TABS: 50.0000 ug | ORAL_TABLET | Freq: Every day | ORAL | 1 refills | Status: DC

## 2024-01-01 MED ORDER — COLCHICINE 0.6 MG PO TABS: 0.6000 mg | ORAL_TABLET | Freq: Two times a day (BID) | ORAL | 1 refills | Status: DC | PRN

## 2024-01-01 MED ORDER — CLOPIDOGREL BISULFATE 75 MG PO TABS: 75.0000 mg | ORAL_TABLET | Freq: Every day | ORAL | 3 refills | Status: DC

## 2024-01-01 MED ORDER — LINAGLIPTIN 5 MG PO TABS: 5.0000 mg | ORAL_TABLET | Freq: Every day | ORAL | 1 refills | Status: DC

## 2024-01-01 MED ORDER — EMPAGLIFLOZIN 25 MG PO TABS: 25.0000 mg | ORAL_TABLET | Freq: Every day | ORAL | 1 refills | Status: DC

## 2024-01-01 MED ORDER — GABAPENTIN 300 MG PO CAPS: 300.0000 mg | ORAL_CAPSULE | Freq: Two times a day (BID) | ORAL | 1 refills | Status: DC

## 2024-01-01 MED ORDER — PANTOPRAZOLE SODIUM 40 MG PO TBEC: 40.0000 mg | DELAYED_RELEASE_TABLET | Freq: Every day | ORAL | 11 refills | Status: DC

## 2024-01-01 MED ORDER — EZETIMIBE 10 MG PO TABS
10.0000 mg | ORAL_TABLET | Freq: Every day | ORAL | 3 refills | Status: DC
Start: 2024-01-01 — End: 2024-01-06

## 2024-01-01 MED ORDER — ISOSORBIDE MONONITRATE ER 30 MG PO TB24: 30.0000 mg | ORAL_TABLET | Freq: Every day | ORAL | 1 refills | Status: DC

## 2024-01-01 MED ORDER — LOSARTAN POTASSIUM 25 MG PO TABS: 25.0000 mg | ORAL_TABLET | Freq: Every day | ORAL | 1 refills | Status: DC

## 2024-01-01 NOTE — Telephone Encounter (Unsigned)
 Copied from CRM [id number]. Topic: Clinical - Medication Refill >> Jan 01, 2024 11:46 AM Travis F wrote: Medication: clopidogrel  (PLAVIX ) 75 MG tablet [[id number]], ezetimibe  (ZETIA ) 10 MG tablet [[id number]], Continuous Glucose Sensor (FREESTYLE LIBRE 2 SENSOR) MISC [[id number]]$MzfnczAzqnmzIZPI_FyoqUoiNArQNtvpGmoQBrwvEGvjXKLdX$$MzfnczAzqnmzIZPI_FyoqUoiNArQNtvpGmoQBrwvEGvjXKLdX$  (ULTRAM ) 50 MG tablet  Has the patient contacted their pharmacy? Yes  (Agent: If yes, when and what did the pharmacy advise?) pharmacy called in refill   This is the patient's preferred pharmacy:   [facility] [place]  [place], [place] - [address] [place] [address] Phone: [phone] Fax: [phone]  Is this the correct pharmacy for this prescription? Yes If no, delete pharmacy and type the correct one.   Has the prescription been filled recently? Yes  Is the patient out of the medication? Yes  Has the patient been seen for an appointment in the last year OR does the patient have an upcoming appointment? Yes  Can we respond through MyChart? No  Agent: Please be advised that Rx refills may take up to 3 business days. We ask that you follow-up with your pharmacy.

## 2024-01-02 MED ORDER — TRAMADOL HCL 50 MG PO TABS: 50.0000 mg | ORAL_TABLET | Freq: Four times a day (QID) | ORAL | 0 refills | Status: DC | PRN

## 2024-01-03 ENCOUNTER — Telehealth: Payer: Self-pay | Admitting: Family Medicine

## 2024-01-03 DIAGNOSIS — E782 Mixed hyperlipidemia: Secondary | ICD-10-CM

## 2024-01-03 DIAGNOSIS — E1122 Type 2 diabetes mellitus with diabetic chronic kidney disease: Secondary | ICD-10-CM

## 2024-01-03 NOTE — Telephone Encounter (Signed)
Duplicate Rx request

## 2024-01-03 NOTE — Telephone Encounter (Signed)
 Copied from CRM [id number]. Topic: Clinical - Medication Refill >> Jan 03, 2024  9:17 AM Willma R wrote: Medication:  clopidogrel  (PLAVIX ) 75 MG tablet ezetimibe  (ZETIA ) 10 MG tablet Continuous Glucose Sensor (FREESTYLE LIBRE 2 SENSOR) MISC traMADol  (ULTRAM ) 50 MG tablet Alcohol Swabs (ALCOHOL WIPES) 70 % PADS   Has the patient contacted their pharmacy? Yes, call dr  This is the patient's preferred pharmacy:  [facility], [place] - [address] [place] [address] Phone: [phone] Fax: [phone]  Is this the correct pharmacy for this prescription? Yes If no, delete pharmacy and type the correct one.   Has the prescription been filled recently? No  Is the patient out of the medication? Yes  Has the patient been seen for an appointment in the last year OR does the patient have an upcoming appointment? Yes  Can we respond through MyChart? Yes  Agent: Please be advised that Rx refills may take up to 3 business days. We ask that you follow-up with your pharmacy.

## 2024-01-06 ENCOUNTER — Encounter: Payer: Self-pay | Admitting: Family Medicine

## 2024-01-06 ENCOUNTER — Ambulatory Visit: Admitting: Family Medicine

## 2024-01-06 ENCOUNTER — Other Ambulatory Visit (HOSPITAL_COMMUNITY): Payer: Self-pay

## 2024-01-06 ENCOUNTER — Telehealth: Payer: Self-pay

## 2024-01-06 ENCOUNTER — Other Ambulatory Visit: Payer: Self-pay

## 2024-01-06 VITALS — BP 130/64 | HR 79 | Temp 98.0°F | Ht 67.0 in | Wt 211.0 lb

## 2024-01-06 DIAGNOSIS — R600 Localized edema: Secondary | ICD-10-CM | POA: Diagnosis not present

## 2024-01-06 DIAGNOSIS — E039 Hypothyroidism, unspecified: Secondary | ICD-10-CM | POA: Diagnosis not present

## 2024-01-06 DIAGNOSIS — E782 Mixed hyperlipidemia: Secondary | ICD-10-CM | POA: Diagnosis not present

## 2024-01-06 DIAGNOSIS — I5032 Chronic diastolic (congestive) heart failure: Secondary | ICD-10-CM | POA: Diagnosis not present

## 2024-01-06 DIAGNOSIS — E1122 Type 2 diabetes mellitus with diabetic chronic kidney disease: Secondary | ICD-10-CM | POA: Diagnosis not present

## 2024-01-06 DIAGNOSIS — Z794 Long term (current) use of insulin: Secondary | ICD-10-CM | POA: Diagnosis not present

## 2024-01-06 DIAGNOSIS — M1A9XX0 Chronic gout, unspecified, without tophus (tophi): Secondary | ICD-10-CM

## 2024-01-06 DIAGNOSIS — N184 Chronic kidney disease, stage 4 (severe): Secondary | ICD-10-CM | POA: Diagnosis not present

## 2024-01-06 DIAGNOSIS — T466X5A Adverse effect of antihyperlipidemic and antiarteriosclerotic drugs, initial encounter: Secondary | ICD-10-CM

## 2024-01-06 DIAGNOSIS — I130 Hypertensive heart and chronic kidney disease with heart failure and stage 1 through stage 4 chronic kidney disease, or unspecified chronic kidney disease: Secondary | ICD-10-CM | POA: Diagnosis not present

## 2024-01-06 DIAGNOSIS — R799 Abnormal finding of blood chemistry, unspecified: Secondary | ICD-10-CM | POA: Diagnosis not present

## 2024-01-06 DIAGNOSIS — M791 Myalgia, unspecified site: Secondary | ICD-10-CM

## 2024-01-06 DIAGNOSIS — R0602 Shortness of breath: Secondary | ICD-10-CM

## 2024-01-06 MED ORDER — COLCHICINE 0.6 MG PO TABS: ORAL_TABLET | ORAL | 1 refills | Status: DC

## 2024-01-06 MED ORDER — CARVEDILOL 6.25 MG PO TABS
6.2500 mg | ORAL_TABLET | Freq: Two times a day (BID) | ORAL | 1 refills | Status: DC
  Filled 2024-01-06: qty 180, 90d supply, fill #0

## 2024-01-06 MED ORDER — ALLOPURINOL 100 MG PO TABS: 100.0000 mg | ORAL_TABLET | ORAL | 1 refills | Status: DC

## 2024-01-06 NOTE — Assessment & Plan Note (Signed)
Stable  Sees nephrology

## 2024-01-06 NOTE — Assessment & Plan Note (Signed)
The current medical regimen is effective;  continue present plan and medications. Continue levothyroxine 50 mcg once daily in am.

## 2024-01-06 NOTE — Assessment & Plan Note (Signed)
 Well controlled.  Statin intolerant.  Continue zetia . Patient did not want tot take repatha .  Labs ordered.

## 2024-01-06 NOTE — Assessment & Plan Note (Signed)
 Concerning for CONGESTIVE HEART FAILURE exacerbation. No changes to medicines. Imdur  30 mg daily, Carvedilol  6.25 mg one twice daily, lasix  80 mg daily. and ranexa  500 mg twice daily.  Continue to work on eating a healthy diet and exercise.  Labs drawn today.   Needs to call cardiology for follow up.

## 2024-01-06 NOTE — Assessment & Plan Note (Addendum)
 Concerning for chf/lower extremity edema.  Ordered pro bnp. Recommend increase lasix  to 80 mg twice daily.

## 2024-01-06 NOTE — Assessment & Plan Note (Signed)
Check probnp.  ?

## 2024-01-06 NOTE — Assessment & Plan Note (Signed)
 Blood glucose fluctuates between 120-250 mg/dL. On Jardiance , Tradjenta , and Novolin 70/30 insulin .  - Order Freestyle Libre 3 and receiver. - Perform blood work to monitor diabetes control. - Ensure diabetes medications are sent through mail order pharmacy.

## 2024-01-06 NOTE — Assessment & Plan Note (Signed)
 Increase torsemide 80 mg twice daily.

## 2024-01-06 NOTE — Telephone Encounter (Signed)
 Pharmacist called from [facility] pharmacy asking refill for three medications for this patient and stated that she just wanted to let the provider know that this patient should not be taking the colchcine 1 tablet daily for PRN gout flare up because the patient is taking Ranolozine and Carvedilol  as well which she states will interact. She states that if the patient has to take the colchicine  for flare up then she only needs to take 1 tablet every 3 days. She just wanted to let us  know for her information. The pharmacist last name was Shirley but the first name I did not get because she was muffled. Told her the recommendation would be sent to the provider.

## 2024-01-06 NOTE — Assessment & Plan Note (Signed)
 Intolerant to statins.

## 2024-01-06 NOTE — Progress Notes (Signed)
 Subjective:  Patient ID: Carol [name], female    DOB: Oct 13, 1935  Age: 88 y.o. MRN: [id number]  Chief Complaint  Patient presents with   Medical Management of Chronic Issues    HPI: Discussed the use of AI scribe software for clinical note transcription with the patient, who gave verbal consent to proceed.  History of Present Illness   The patient, with a history of diabetes and heart issues, presents with postoperative swelling following hip fracture surgery.  Postoperative edema and weight gain - Bilateral lower extremity swelling, more pronounced on the left, began after hip replacement surgery on May 31st - Swelling interferes with ability to wear shoes - Weight increased from 183 to over 200 pounds, attributed to fluid retention - Elevates legs and avoids salt to manage swelling - On Lasix  80 mg daily for fluid management. - No current chest pain - No symptoms of urinary tract infection  Mobility impairment and functional status - Uses a walker for ambulation - Difficulty with mobility following hip fracture and surgery - Frequent urination - No current hip pain postoperatively, attributed to pain medication and a patch - Severe pain prior to surgery, none currently  Glycemic control and diabetes management - Last A1C 7.9. - Blood glucose levels range from 120 to 250 mg/dL - Uses Freestyle Libre for glucose monitoring, has not received requested updated version - Medications: Jardiance  25 mg daily, Tradjenta  5 mg daily, Novolin 70/30 (30 units before breakfast) - Occasionally eats to prevent hypoglycemia - Eats twice a day. Not eating her vegetables. Eating chicken breast, potatoes (air fry.) eating some fruit.   Cardiopulmonary symptoms and heart disease - History of heart disease - Medications: Ranexa , carvedilol , isosorbide , losartan  - Shortness of breath, especially with exertion (bending over, changing sheets)  Neuropathy and pain management - On gabapentin   300 mg one twice daily for neuropathy - Occasional severe toe pain, not believed to be gout-related, managed with tramadol  as needed  Gout and hyperuricemia - On allopurinol  for gout - No current gout flare  Hyperlipidemia - On Zetia  for cholesterol management - Previously on Repatha , discontinued due to adverse effects  Adverse medication effects - Discontinued Ozempic  and Repatha  due to adverse effects  Recent trauma and surgical history - Sustained left hip fracture after tripping over a rug on May 31st - Underwent hip replacement surgery at [facility] - Sustained abrasion to left elbow during fall    GERD:  Not taking pantoprazole  40 mg daily or famotidine  regularly. Rarely needs it.    Gout:  patient is on allopurinol  300 mg daily and colchicine  twice daily as needed if she has a flare. Takes for about 3 days.    Hypothyroidism: on levothyroxine  50 mcg once daily in am.          07/19/2023    9:54 AM 05/06/2023    2:23 PM 02/18/2023   10:56 AM 10/22/2022    2:45 PM 07/16/2022    9:59 AM  Depression screen PHQ 2/9  Decreased Interest 0 0 0 0 0  Down, Depressed, Hopeless 0 0 0 0 0  PHQ - 2 Score 0 0 0 0 0  Altered sleeping   0    Tired, decreased energy   0    Change in appetite   0    Feeling bad or failure about yourself    0    Trouble concentrating   0    Moving slowly or fidgety/restless   0  Suicidal thoughts   0    PHQ-9 Score   0    Difficult doing work/chores   Not difficult at all          09/30/2023    8:27 AM  Fall Risk   Falls in the past year? 0  Number falls in past yr: 0  Injury with Fall? 0  Risk for fall due to : No Fall Risks  Follow up Falls evaluation completed    Patient Care Team: Sherre Clapper, MD as PCP - General (Internal Medicine) Monetta Redell PARAS, MD as Consulting Physician (Cardiology) Inocencio Soyla Lunger, MD as Consulting Physician (Cardiology) Associates, [facility] Pandora Cadet, [facility] as Pharmacist (Pharmacist)    Review of Systems  Constitutional:  Negative for chills, fatigue and fever.  HENT:  Negative for congestion, ear pain, rhinorrhea and sore throat.   Respiratory:  Negative for cough and shortness of breath.   Cardiovascular:  Positive for leg swelling (BL). Negative for chest pain.  Gastrointestinal:  Negative for abdominal pain, constipation, diarrhea, nausea and vomiting.  Genitourinary:  Negative for dysuria and urgency.  Musculoskeletal:  Negative for back pain and myalgias.  Neurological:  Negative for dizziness, weakness, light-headedness and headaches.  Psychiatric/Behavioral:  Negative for dysphoric mood. The patient is not nervous/anxious.     Current Outpatient Medications on File Prior to Visit  Medication Sig Dispense Refill   acetaminophen  (TYLENOL ) 500 MG tablet Take 2 tablets (1,000 mg total) by mouth every 8 (eight) hours as needed for mild pain (pain score 1-3) or moderate pain (pain score 4-6).     Biotin  5000 MCG CAPS Take 5,000 mcg by mouth every morning.     Blood Glucose Monitoring Suppl (TRUE METRIX METER) DEVI Use to test blood sugars once daily. ICD: E11.65     calcium  carbonate (OSCAL) 1500 (600 Ca) MG TABS tablet Take 600 mg by mouth daily with breakfast.     Continuous Glucose Receiver (FREESTYLE LIBRE 2 READER) DEVI 1 each by Does not apply route as directed. 1 each 0   Continuous Glucose Sensor (FREESTYLE LIBRE 2 SENSOR) MISC 1 each by Does not apply route every 14 (fourteen) days. 6 each 1   famotidine  (PEPCID ) 10 MG tablet Take 10 mg by mouth 2 (two) times daily as needed for heartburn or indigestion.      fluticasone (FLONASE) 50 MCG/ACT nasal spray Place 1 spray into both nostrils daily as needed for allergies.     Glucagon  (GVOKE HYPOPEN  2-PACK) 0.5 MG/0.1ML SOAJ Inject 0.5 mg into the skin daily as needed. 0.2 mL 2   glucose blood (TRUE METRIX BLOOD GLUCOSE TEST) test strip Use as instructed 100 each 12   insulin  NPH-regular Human (70-30) 100 UNIT/ML  injection Inject 25-32 Units into the skin daily. Reports 20 units in morning and 10-12 at night if needed. 10 mL 2   lidocaine  (LIDODERM ) 5 % Place 1 patch onto the skin daily. Remove & Discard patch within 12 hours or as directed by MD.  Apply to low mid back at site of maximal pain. 9 patch 0   nitroGLYCERIN  (NITROSTAT ) 0.4 MG SL tablet Place 1 tablet (0.4 mg total) under the tongue every 5 (five) minutes as needed for chest pain. 25 tablet 11   TRUEplus Lancets 33G MISC Use to test blood sugar once daily. ICD: E11.65     No current facility-administered medications on file prior to visit.   Past Medical History:  Diagnosis Date   Anemia  a. Noted on labs 02/2014 possibly procedurally related.   Asthma    CAD S/P percutaneous coronary angioplasty 01/22/2014   A. (12/2013): POBA - OM2 99% (very tortuous segment) - reduced ~ 50% & TIMI 3 flow, 80-90% stenosis in mid PDA, Irregularities <50% in mRCA, pLAD and prox LCx; b. 02/17/14: Moderate sized fixed defect in Lateral wall --> cath with OM2 restenosis & otherwise stable --> Xience Alpine DES x 2 (2.25 mm x 12 & 8 mm). c. NSTEMI 02/2014 s/p PTCA/balloon angioplasty only to small caliber PDA.   Carotid stenosis    Carotid US  11/17: L 1-39; FU prn   Chronic systolic heart failure (HCC)    a. ICM b. ECHO (01/2014): EF 25-30%, grade I DD, trivial MR. c. EF by Myoview  02/17/14 = ~53%. d. EF by cath 02/24/14 - improved to 50-55%.   CKD (chronic kidney disease), stage III (HCC)    Contrast media allergy    Environmental allergies    GERD (gastroesophageal reflux disease)    Gout, unspecified    HOH (hard of hearing)    Hyperlipidemia    a. H/o intolerance to Zocor, Lipitor . Had muscle aches on higher dose Crestor .   Hypertension    Hypothyroidism    LBBB (left bundle branch block)    a. Transient during 02/2014 admission.   NSTEMI (non-ST elevated myocardial infarction) (HCC) 2014; 12/2013, 01/2014   QT prolongation    a. Noted on EKG 02/2014.    Sinus bradycardia    a. HR 40s-50s during 02/2014 admission, limiting BB dose.   TIA (transient ischemic attack) 1990's   a. TIA vs stroke 1990's mild - sounds like a TIA as she was told she had stroke symptoms but negative scans. Denies residual effects.   Type II diabetes mellitus [facility])    Past Surgical History:  Procedure Laterality Date   ABDOMINAL HYSTERECTOMY     complete   AMPUTATION TOE Right 02/25/2021   hammer toe.   APPENDECTOMY  1959   CARDIAC CATHETERIZATION  02/24/2014   Procedure: CORONARY BALLOON ANGIOPLASTY;  Surgeon: Lonni JONETTA Cash, MD;  Location: [facility] CATH LAB;  Service: Cardiovascular;;   CARDIAC CATHETERIZATION N/A 04/20/2015   Procedure: Left Heart Cath and Coronary Angiography;  Surgeon: Victory LELON Sharps, MD;  Location: [facility] INVASIVE CV LAB;  Service: Cardiovascular;  Laterality: N/A;   CATARACT EXTRACTION W/ INTRAOCULAR LENS  IMPLANT, BILATERAL Bilateral 2013   CESAREAN SECTION  1959; 1960   CHOLECYSTECTOMY  1989   CORONARY ANGIOPLASTY  12/2013   POBA - OM2   CORONARY ARTERY BYPASS GRAFT N/A 04/25/2015   Procedure: CORONARY ARTERY BYPASS GRAFTING (CABG) x four,  using left internal mammary artery and right leg greater saphenous vein harvested endoscopically;  Surgeon: Dorise MARLA Fellers, MD;  Location: [facility] OR;  Service: Open Heart Surgery;  Laterality: N/A;   JOINT REPLACEMENT Right 10 years ago    right knee (TKR)   LEFT HEART CATHETERIZATION WITH CORONARY ANGIOGRAM N/A 01/22/2014   Procedure: LEFT HEART CATHETERIZATION WITH CORONARY ANGIOGRAM;  Surgeon: Victory LELON Sharps DOUGLAS, MD;  Location: [facility] CATH LAB;  Service: Cardiovascular;  Laterality: N/A;   LEFT HEART CATHETERIZATION WITH CORONARY ANGIOGRAM N/A 02/17/2014   Procedure: LEFT HEART CATHETERIZATION WITH CORONARY ANGIOGRAM;  Surgeon: Lonni JONETTA Cash, MD;  Location: [facility] CATH LAB;  Service: Cardiovascular;  Laterality: N/A;   LEFT HEART CATHETERIZATION WITH CORONARY ANGIOGRAM N/A 02/24/2014   Procedure: LEFT  HEART CATHETERIZATION WITH CORONARY ANGIOGRAM;  Surgeon: Lonni JONETTA Cash, MD;  Location: [facility] CATH LAB;  Service: Cardiovascular;  Laterality: N/A;   LUMBAR LAMINECTOMY/DECOMPRESSION MICRODISCECTOMY Left 12/22/2014   Procedure: CENTRAL DECOMPRESSIVE LUMBAR, AND LAMINECTOMY L5-S1,  S1-S2 FOR SPINAL STENOSIS FORAMINOTOMY L5, S1, S2 ROOTS ON LEFT FOR FORAMINAL STENOSIS, AND DECOMPRESSION L5-S1, S1-S2 ACCORDING TO LABELING OF [facility] X-RAYS;  Surgeon: Tanda Heading, MD;  Location: [facility] ORS;  Service: Orthopedics;  Laterality: Left;   PERCUTANEOUS CORONARY STENT INTERVENTION (PCI-S)  02/17/2014   For restenosis of OM2 - PCI with Xience Apline DES 2.25 mm x 12 mm & 2.25 mm x 8 mm overlapping   REDUCTION MAMMAPLASTY Bilateral 1990's   SHOULDER OPEN ROTATOR CUFF REPAIR Bilateral 1980's - 1990's   TEE WITHOUT CARDIOVERSION N/A 04/25/2015   Procedure: TRANSESOPHAGEAL ECHOCARDIOGRAM (TEE);  Surgeon: Dorise MARLA Fellers, MD;  Location: [facility] OR;  Service: Open Heart Surgery;  Laterality: N/A;   TOTAL HIP ARTHROPLASTY Left 11/29/2023   TOTAL KNEE ARTHROPLASTY Right 2009   TOTAL KNEE ARTHROPLASTY Left 04/21/2018   TKR;  Surgeon: Rubie Kemps, MD;  Location: [facility] ORS;  Service: Orthopedics;  Laterality: Left;  Adductor Block    Family History  Problem Relation Age of Onset   Hypertension Mother    Diabetes Mother    Heart attack Mother 62   Heart disease Father    Emphysema Father    Diabetes Brother    Heart disease Brother    Cancer Sister    Stroke Neg Hx    Social History   Socioeconomic History   Marital status: Widowed    Spouse name: Not on file   Number of children: 2   Years of education: Not on file   Highest education level: Not on file  Occupational History   Occupation: Retired  Tobacco Use   Smoking status: Never   Smokeless tobacco: Never  Vaping Use   Vaping status: Never Used  Substance and Sexual Activity   Alcohol use: No   Drug use: No   Sexual activity: Never   Other Topics Concern   Not on file  Social History Narrative   Lives in [place] by herself.    Social Drivers of Corporate investment banker Strain: Low Risk  (10/22/2022)   Overall Financial Resource Strain (CARDIA)    Difficulty of Paying Living Expenses: Not hard at all  Food Insecurity: No Food Insecurity (07/05/2023)   Hunger Vital Sign    Worried About Running Out of Food in the Last Year: Never true    Ran Out of Food in the Last Year: Never true  Transportation Needs: No Transportation Needs (07/05/2023)   PRAPARE - Administrator, Civil Service (Medical): No    Lack of Transportation (Non-Medical): No  Physical Activity: Sufficiently Active (10/22/2022)   Exercise Vital Sign    Days of Exercise per Week: 6 days    Minutes of Exercise per Session: 40 min  Stress: No Stress Concern Present (10/22/2022)   [employer] of Occupational Health - Occupational Stress Questionnaire    Feeling of Stress : Not at all  Social Connections: Unknown (07/05/2023)   Social Connection and Isolation Panel    Frequency of Communication with Friends and Family: Never    Frequency of Social Gatherings with Friends and Family: Never    Attends Religious Services: Not on Insurance claims handler of Clubs or Organizations: Yes    Attends Banker Meetings: More than 4 times per year    Marital Status: Widowed  Recent Concern: Social Connections - Moderately Isolated (07/05/2023)   Social Connection and Isolation Panel    Frequency of Communication with Friends and Family: Never    Frequency of Social Gatherings with Friends and Family: Never    Attends Religious Services: More than 4 times per year    Active Member of [employer] or Organizations: Yes    Attends Banker Meetings: More than 4 times per year    Marital Status: Widowed    Objective:  BP 130/64   Pulse 79   Temp 98 F (36.7 C)   Ht 5' 7 (1.702 m)   Wt 211 lb (95.7 kg)   SpO2 95%   BMI 33.05  kg/m      01/06/2024    8:37 AM 09/30/2023    8:24 AM 07/19/2023    9:50 AM  BP/Weight  Systolic BP 130 124 116  Diastolic BP 64 70 62  Wt. (Lbs) 211 191 184  BMI 33.05 kg/m2 29.91 kg/m2 28.82 kg/m2    Physical Exam Vitals reviewed.  Constitutional:      Appearance: Normal appearance. She is obese.  Neck:     Vascular: No carotid bruit.  Cardiovascular:     Rate and Rhythm: Normal rate and regular rhythm.     Pulses: Normal pulses.     Heart sounds: Normal heart sounds.  Pulmonary:     Effort: Pulmonary effort is normal. No respiratory distress.     Breath sounds: Normal breath sounds.  Abdominal:     General: Abdomen is flat. Bowel sounds are normal.     Palpations: Abdomen is soft.     Tenderness: There is no abdominal tenderness.  Musculoskeletal:     Right lower leg: Edema present.     Left lower leg: Edema present.  Neurological:     Mental Status: She is alert and oriented to person, place, and time.  Psychiatric:        Mood and Affect: Mood normal.        Behavior: Behavior normal.      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  No calluses.  Decreased sensation BL feet.       Lab Results  Component Value Date   WBC 7.9 01/06/2024   HGB 9.5 (L) 01/06/2024   HCT 30.9 (L) 01/06/2024   PLT 278 01/06/2024   GLUCOSE 151 (H) 01/06/2024   CHOL 149 01/06/2024   TRIG 81 01/06/2024   HDL 51 01/06/2024   LDLCALC 82 01/06/2024   ALT 7 01/06/2024   AST 16 01/06/2024   NA 140 01/06/2024   K 5.6 (H) 01/06/2024   CL 99 01/06/2024   CREATININE 2.26 (H) 01/06/2024   BUN 47 (H) 01/06/2024   CO2 23 01/06/2024   TSH 4.900 (H) 01/06/2024   INR 0.94 04/21/2018   HGBA1C 6.1 (H) 01/06/2024      Assessment & Plan:  Acquired hypothyroidism Assessment & Plan: The current medical regimen is effective;  continue present plan and medications. - Continue  levothyroxine  50 mcg once daily in am.   Orders: -     TSH  Hypertensive heart and renal disease with renal failure, stage 1 through stage 4 or unspecified chronic kidney disease, with heart failure (HCC) Assessment & Plan: Concerning for CONGESTIVE HEART FAILURE exacerbation. No changes to medicines. Imdur  30 mg daily, Carvedilol  6.25 mg one twice daily, lasix   80 mg daily. and ranexa  500 mg twice daily.  Continue to work on eating a healthy diet and exercise.  Labs drawn today.   Needs to call cardiology for follow up.   Chronic gout without tophus, unspecified cause, unspecified site Assessment & Plan: Decrease colchicine  to one dose every 3 days due to ckd stage 4.  Decrease allopurinol  50 mg one every other day.  .   Myalgia due to statin Assessment & Plan: Intolerant to statins.    Pedal edema Assessment & Plan: Increase torsemide 80 mg twice daily.   Orders: -     NT-proBNP  Type 2 diabetes mellitus with stage 4 chronic kidney disease, with long-term current use of insulin  (HCC) Assessment & Plan: Blood glucose fluctuates between 120-250 mg/dL. On Jardiance , Tradjenta , and Novolin 70/30 insulin .  - Order Freestyle Libre 3 and receiver. - Perform blood work to monitor diabetes control. - Ensure diabetes medications are sent through mail order pharmacy.  Orders: -     Hemoglobin A1c  Shortness of breath Assessment & Plan: Check probnp  Orders: -     NT-proBNP  Heart failure with improved ejection fraction (HFimpEF) (HCC) Assessment & Plan: Concerning for chf/lower extremity edema.  Ordered pro bnp. Recommend increase lasix  to 80 mg twice daily.   Orders: -     CBC with Differential/Platelet -     Comprehensive metabolic panel with GFR  Mixed hyperlipidemia Assessment & Plan: Well controlled.  Statin intolerant.  Continue zetia . Patient did not want tot take repatha .  Labs ordered.   Orders: -     Lipid panel  Other orders -     B12 and  Folate Panel -     Specimen status report              Meds ordered this encounter  Medications   DISCONTD: carvedilol  (COREG ) 6.25 MG tablet    Sig: Take 1 tablet (6.25 mg total) by mouth 2 (two) times daily with a meal.    Dispense:  180 tablet    Refill:  1    Requesting mail order. Patient Phone [phone]   DISCONTD: allopurinol  (ZYLOPRIM ) 100 MG tablet    Sig: Take 1 tablet (100 mg total) by mouth every other day.    Dispense:  45 tablet    Refill:  1    Cancel previous prescription for allopurinol .   DISCONTD: colchicine  0.6 MG tablet    Sig: Every 3 days as needed for gout flare up.    Dispense:  30 tablet    Refill:  1    Cancel all previous rxs for colchicine .    Orders Placed This Encounter  Procedures   CBC with Differential/Platelet   Comprehensive metabolic panel with GFR   Hemoglobin A1c   Lipid panel   TSH   NT-proBNP   B12 and Folate Panel   Specimen status report     Follow-up: Return in about 2 weeks (around 01/20/2024) for Dina for swelling.   I,Katherina A Bramblett,acting as a scribe for Abigail Free, MD.,have documented all relevant documentation on the behalf of Abigail Free, MD,as directed by  Abigail Free, MD while in the presence of Abigail Free, MD.   An After Visit Summary was printed and given to the patient.  I attest that I have reviewed this visit and agree with the plan scribed by my staff.   Abigail Free, MD [facility] [phone]

## 2024-01-06 NOTE — Assessment & Plan Note (Addendum)
 Decrease colchicine  to one dose every 3 days due to ckd stage 4.  Decrease allopurinol  50 mg one every other day.  SABRA

## 2024-01-07 ENCOUNTER — Ambulatory Visit: Payer: Self-pay | Admitting: Family Medicine

## 2024-01-07 LAB — COMPREHENSIVE METABOLIC PANEL WITH GFR
ALT: 7 IU/L (ref 0–32)
AST: 16 IU/L (ref 0–40)
Albumin: 4.2 g/dL (ref 3.7–4.7)
Alkaline Phosphatase: 180 IU/L — ABNORMAL HIGH (ref 44–121)
BUN/Creatinine Ratio: 21 (ref 12–28)
BUN: 47 mg/dL — ABNORMAL HIGH (ref 8–27)
Bilirubin Total: 0.3 mg/dL (ref 0.0–1.2)
CO2: 23 mmol/L (ref 20–29)
Calcium: 9.4 mg/dL (ref 8.7–10.3)
Chloride: 99 mmol/L (ref 96–106)
Creatinine, Ser: 2.26 mg/dL — ABNORMAL HIGH (ref 0.57–1.00)
Globulin, Total: 2.5 g/dL (ref 1.5–4.5)
Glucose: 151 mg/dL — ABNORMAL HIGH (ref 70–99)
Potassium: 5.6 mmol/L — ABNORMAL HIGH (ref 3.5–5.2)
Sodium: 140 mmol/L (ref 134–144)
Total Protein: 6.7 g/dL (ref 6.0–8.5)
eGFR: 21 mL/min/1.73 — ABNORMAL LOW (ref >59)

## 2024-01-07 LAB — CBC WITH DIFFERENTIAL/PLATELET
Basophils Absolute: 0 x10E3/uL (ref 0.0–0.2)
Basos: 1 %
EOS (ABSOLUTE): 0.1 x10E3/uL (ref 0.0–0.4)
Eos: 1 %
Hematocrit: 30.9 % — ABNORMAL LOW (ref 34.0–46.6)
Hemoglobin: 9.5 g/dL — ABNORMAL LOW (ref 11.1–15.9)
Immature Grans (Abs): 0 x10E3/uL (ref 0.0–0.1)
Immature Granulocytes: 0 %
Lymphocytes Absolute: 1.3 x10E3/uL (ref 0.7–3.1)
Lymphs: 16 %
MCH: 31 pg (ref 26.6–33.0)
MCHC: 30.7 g/dL — ABNORMAL LOW (ref 31.5–35.7)
MCV: 101 fL — ABNORMAL HIGH (ref 79–97)
Monocytes Absolute: 0.8 x10E3/uL (ref 0.1–0.9)
Monocytes: 11 %
Neutrophils Absolute: 5.6 x10E3/uL (ref 1.4–7.0)
Neutrophils: 71 %
Platelets: 278 x10E3/uL (ref 150–450)
RBC: 3.06 x10E6/uL — ABNORMAL LOW (ref 3.77–5.28)
RDW: 14.8 % (ref 11.7–15.4)
WBC: 7.9 x10E3/uL (ref 3.4–10.8)

## 2024-01-07 LAB — LIPID PANEL
Chol/HDL Ratio: 2.9 ratio (ref 0.0–4.4)
Cholesterol, Total: 149 mg/dL (ref 100–199)
HDL: 51 mg/dL (ref >39)
LDL Chol Calc (NIH): 82 mg/dL (ref 0–99)
Triglycerides: 81 mg/dL (ref 0–149)
VLDL Cholesterol Cal: 16 mg/dL (ref 5–40)

## 2024-01-07 LAB — HEMOGLOBIN A1C
Est. average glucose Bld gHb Est-mCnc: 128 mg/dL
Hgb A1c MFr Bld: 6.1 % — ABNORMAL HIGH (ref 4.8–5.6)

## 2024-01-07 LAB — TSH: TSH: 4.9 u[IU]/mL — ABNORMAL HIGH (ref 0.450–4.500)

## 2024-01-07 MED ORDER — EZETIMIBE 10 MG PO TABS: 10.0000 mg | ORAL_TABLET | Freq: Every day | ORAL | 3 refills | Status: DC

## 2024-01-07 MED ORDER — TRAMADOL HCL 50 MG PO TABS: 50.0000 mg | ORAL_TABLET | Freq: Four times a day (QID) | ORAL | 0 refills | Status: DC | PRN

## 2024-01-07 MED ORDER — CLOPIDOGREL BISULFATE 75 MG PO TABS: 75.0000 mg | ORAL_TABLET | Freq: Every day | ORAL | 1 refills | Status: DC

## 2024-01-08 ENCOUNTER — Other Ambulatory Visit (HOSPITAL_COMMUNITY): Payer: Self-pay

## 2024-01-08 ENCOUNTER — Other Ambulatory Visit: Payer: Self-pay

## 2024-01-08 DIAGNOSIS — E782 Mixed hyperlipidemia: Secondary | ICD-10-CM

## 2024-01-08 DIAGNOSIS — R0602 Shortness of breath: Secondary | ICD-10-CM

## 2024-01-08 DIAGNOSIS — R799 Abnormal finding of blood chemistry, unspecified: Secondary | ICD-10-CM

## 2024-01-08 DIAGNOSIS — I2510 Atherosclerotic heart disease of native coronary artery without angina pectoris: Secondary | ICD-10-CM

## 2024-01-08 DIAGNOSIS — R600 Localized edema: Secondary | ICD-10-CM

## 2024-01-08 DIAGNOSIS — K219 Gastro-esophageal reflux disease without esophagitis: Secondary | ICD-10-CM

## 2024-01-08 DIAGNOSIS — I130 Hypertensive heart and chronic kidney disease with heart failure and stage 1 through stage 4 chronic kidney disease, or unspecified chronic kidney disease: Secondary | ICD-10-CM

## 2024-01-08 DIAGNOSIS — M1A9XX0 Chronic gout, unspecified, without tophus (tophi): Secondary | ICD-10-CM

## 2024-01-08 DIAGNOSIS — I5032 Chronic diastolic (congestive) heart failure: Secondary | ICD-10-CM

## 2024-01-08 DIAGNOSIS — E875 Hyperkalemia: Secondary | ICD-10-CM

## 2024-01-08 DIAGNOSIS — E1140 Type 2 diabetes mellitus with diabetic neuropathy, unspecified: Secondary | ICD-10-CM

## 2024-01-08 MED ORDER — ISOSORBIDE MONONITRATE ER 30 MG PO TB24
30.0000 mg | ORAL_TABLET | Freq: Every day | ORAL | 1 refills | Status: AC
  Filled 2024-01-08 – 2024-07-16 (×2): qty 90, 90d supply, fill #0

## 2024-01-08 MED ORDER — RANOLAZINE ER 500 MG PO TB12
500.0000 mg | ORAL_TABLET | Freq: Two times a day (BID) | ORAL | 1 refills | Status: DC
  Filled 2024-01-08: qty 180, 90d supply, fill #0

## 2024-01-08 MED ORDER — LOSARTAN POTASSIUM 25 MG PO TABS
25.0000 mg | ORAL_TABLET | Freq: Every day | ORAL | 1 refills | Status: DC
  Filled 2024-01-08: qty 90, 90d supply, fill #0

## 2024-01-08 MED ORDER — CARVEDILOL 6.25 MG PO TABS
6.2500 mg | ORAL_TABLET | Freq: Two times a day (BID) | ORAL | 1 refills | Status: DC
  Filled 2024-01-08 – 2024-05-04 (×2): qty 180, 90d supply, fill #0

## 2024-01-08 MED ORDER — ALLOPURINOL 100 MG PO TABS
100.0000 mg | ORAL_TABLET | ORAL | 1 refills | Status: DC
  Filled 2024-01-08: qty 45, 90d supply, fill #0

## 2024-01-08 MED ORDER — EMPAGLIFLOZIN 25 MG PO TABS
25.0000 mg | ORAL_TABLET | Freq: Every day | ORAL | 1 refills | Status: DC
  Filled 2024-01-08 – 2024-02-10 (×2): qty 90, 90d supply, fill #0

## 2024-01-08 MED ORDER — FUROSEMIDE 80 MG PO TABS
ORAL_TABLET | ORAL | 1 refills | Status: DC
  Filled 2024-01-08: qty 180, 90d supply, fill #0

## 2024-01-08 MED ORDER — LEVOTHYROXINE SODIUM 75 MCG PO TABS
75.0000 ug | ORAL_TABLET | Freq: Every day | ORAL | 1 refills | Status: AC
  Filled 2024-01-08: qty 90, 90d supply, fill #0
  Filled 2024-07-16: qty 90, 90d supply, fill #1

## 2024-01-08 MED ORDER — GABAPENTIN 300 MG PO CAPS
300.0000 mg | ORAL_CAPSULE | Freq: Two times a day (BID) | ORAL | 1 refills | Status: DC
  Filled 2024-01-08 – 2024-05-12 (×2): qty 180, 90d supply, fill #0

## 2024-01-08 MED ORDER — ALBUTEROL SULFATE HFA 108 (90 BASE) MCG/ACT IN AERS
2.0000 | INHALATION_SPRAY | Freq: Four times a day (QID) | RESPIRATORY_TRACT | 3 refills | Status: AC | PRN
  Filled 2024-01-08: qty 4, fill #0

## 2024-01-08 MED ORDER — COLCHICINE 0.6 MG PO TABS
ORAL_TABLET | ORAL | 1 refills | Status: AC
  Filled 2024-01-08: qty 30, fill #0

## 2024-01-08 MED ORDER — PANTOPRAZOLE SODIUM 40 MG PO TBEC
40.0000 mg | DELAYED_RELEASE_TABLET | Freq: Every day | ORAL | 3 refills | Status: DC
  Filled 2024-01-08: qty 90, 90d supply, fill #0

## 2024-01-08 MED ORDER — LINAGLIPTIN 5 MG PO TABS
5.0000 mg | ORAL_TABLET | Freq: Every day | ORAL | 1 refills | Status: DC
  Filled 2024-01-08 – 2024-02-10 (×2): qty 90, 90d supply, fill #0

## 2024-01-08 MED ORDER — EZETIMIBE 10 MG PO TABS
10.0000 mg | ORAL_TABLET | Freq: Every day | ORAL | 3 refills | Status: DC
  Filled 2024-01-08 – 2024-05-04 (×2): qty 90, 90d supply, fill #0

## 2024-01-08 MED ORDER — CLOPIDOGREL BISULFATE 75 MG PO TABS
75.0000 mg | ORAL_TABLET | Freq: Every day | ORAL | 1 refills | Status: DC
  Filled 2024-01-08: qty 90, 90d supply, fill #0

## 2024-01-08 NOTE — Telephone Encounter (Signed)
 Done. Patient is changing her mail order pharmacy.

## 2024-01-09 ENCOUNTER — Other Ambulatory Visit: Payer: Self-pay | Admitting: Family Medicine

## 2024-01-10 LAB — B12 AND FOLATE PANEL
Folate: 12.5 ng/mL (ref >3.0)
Vitamin B-12: 580 pg/mL (ref 232–1245)

## 2024-01-10 LAB — SPECIMEN STATUS REPORT

## 2024-01-10 MED ORDER — TRAMADOL HCL 50 MG PO TABS: 50.0000 mg | ORAL_TABLET | Freq: Every day | ORAL | 0 refills | Status: DC | PRN

## 2024-01-13 ENCOUNTER — Ambulatory Visit (INDEPENDENT_AMBULATORY_CARE_PROVIDER_SITE_OTHER): Admitting: Family Medicine

## 2024-01-13 ENCOUNTER — Encounter: Payer: Self-pay | Admitting: Family Medicine

## 2024-01-13 VITALS — BP 108/69 | HR 68 | Temp 97.8°F | Resp 18 | Ht 67.0 in | Wt 201.6 lb

## 2024-01-13 DIAGNOSIS — E039 Hypothyroidism, unspecified: Secondary | ICD-10-CM

## 2024-01-13 DIAGNOSIS — R600 Localized edema: Secondary | ICD-10-CM

## 2024-01-13 DIAGNOSIS — N184 Chronic kidney disease, stage 4 (severe): Secondary | ICD-10-CM

## 2024-01-13 DIAGNOSIS — M1A9XX0 Chronic gout, unspecified, without tophus (tophi): Secondary | ICD-10-CM

## 2024-01-13 DIAGNOSIS — E875 Hyperkalemia: Secondary | ICD-10-CM | POA: Diagnosis not present

## 2024-01-13 NOTE — Assessment & Plan Note (Signed)
 Elevated Lab Results  Component Value Date   NA 140 01/06/2024   CL 99 01/06/2024   K 5.6 (H) 01/06/2024   CO2 23 01/06/2024   BUN 47 (H) 01/06/2024   CREATININE 2.26 (H) 01/06/2024   EGFR 21 (L) 01/06/2024   CALCIUM  9.4 01/06/2024   PHOS 4.3 11/09/2021   ALBUMIN  4.2 01/06/2024   GLUCOSE 151 (H) 01/06/2024   - Recheck kidney function today, Await labs/testing for assessment and recommendations

## 2024-01-13 NOTE — Assessment & Plan Note (Signed)
 Elevated thyroid  levels. Synthroid  dose increased to 75 micrograms. New prescription provided. Lab Results  Component Value Date   TSH 4.900 (H) 01/06/2024   - Continue Synthroid  75 micrograms as prescribed

## 2024-01-13 NOTE — Assessment & Plan Note (Signed)
 Continue current medication management as noted from her last visit. - colchicine  0.6 mg every 3 days due to ckd stage 4.  - allopurinol  50 mg one every other day.

## 2024-01-13 NOTE — Assessment & Plan Note (Signed)
 Chronic leg swelling improved with diuretic therapy. Weight loss of 12 pounds noted. Cardiovascular issues considered due to heart surgery history. Blood work to assess cardiovascular status and potassium levels before further diuretic decisions. - Continue Lasix  80 mg twice daily until blood work results are available. - Order CBC, pro-BNP, and BMP to assess cardiovascular status and potassium levels. - Review blood work results to determine if Lasix  can be discontinued.

## 2024-01-13 NOTE — Progress Notes (Signed)
 Subjective:  Patient ID: Carol [name], female    DOB: [birth year]  Age: 88 y.o. MRN: [id number]  Chief Complaint  Patient presents with   Leg Swelling    Discussed the use of AI scribe software for clinical note transcription with the patient, who gave verbal consent to proceed.  History of Present Illness   Carol [name] is an 88 year old female who presents for follow-up of leg swelling.  She has been experiencing significant leg swelling, which has improved since her last visit. Her weight has decreased from 212 pounds to 200 pounds at home, and 201 pounds at the clinic, attributing the difference to wearing shoes and clothes. She has been taking a diuretic, which she finds effective as she urinates frequently, up to nine times a night, and attributes her weight loss to this medication. She also experiences lightheadedness and dizziness, which she associates with her medications.  She has a history of gout, causing significant pain in her big toe and affecting her sleep, limiting it to only an hour and a half due to the pain.  Her thyroid  levels were previously high, and she was prescribed an increased dose of Synthroid  to 75 micrograms. She has received the new prescription but has not yet started it.  Her A1c has improved from 7.5 to 6.1. She mentions that her potassium levels were elevated, possibly due to taking potassium pills and eating bananas, which she has since stopped.          07/19/2023    9:54 AM 05/06/2023    2:23 PM 02/18/2023   10:56 AM 10/22/2022    2:45 PM 07/16/2022    9:59 AM  Depression screen PHQ 2/9  Decreased Interest 0 0 0 0 0  Down, Depressed, Hopeless 0 0 0 0 0  PHQ - 2 Score 0 0 0 0 0  Altered sleeping   0    Tired, decreased energy   0    Change in appetite   0    Feeling bad or failure about yourself    0    Trouble concentrating   0    Moving slowly or fidgety/restless   0    Suicidal thoughts   0    PHQ-9 Score   0    Difficult doing  work/chores   Not difficult at all          09/30/2023    8:27 AM  Fall Risk   Falls in the past year? 0  Number falls in past yr: 0  Injury with Fall? 0  Risk for fall due to : No Fall Risks  Follow up Falls evaluation completed    Patient Care Team: Sherre Clapper, MD as PCP - General (Internal Medicine) Monetta Redell PARAS, MD as Consulting Physician (Cardiology) Inocencio Soyla Lunger, MD as Consulting Physician (Cardiology) Associates, [facility] Pandora Cadet, [facility] as Pharmacist (Pharmacist)   Review of Systems  Constitutional:  Negative for chills, diaphoresis, fatigue and fever.  HENT:  Negative for congestion, ear pain and sinus pain.   Eyes: Negative.   Respiratory:  Negative for cough and shortness of breath.   Cardiovascular:  Positive for leg swelling. Negative for chest pain.  Gastrointestinal:  Positive for constipation. Negative for abdominal pain, diarrhea, nausea and vomiting.  Endocrine: Negative.   Genitourinary:  Negative for dysuria, frequency and urgency.  Musculoskeletal:  Positive for joint swelling (gouty flare - left). Negative for arthralgias.  Skin:  Negative for rash.  Allergic/Immunologic: Negative.   Neurological:  Negative for weakness and headaches.  Psychiatric/Behavioral:  Negative for dysphoric mood. The patient is not nervous/anxious.     Current Outpatient Medications on File Prior to Visit  Medication Sig Dispense Refill   acetaminophen  (TYLENOL ) 500 MG tablet Take 2 tablets (1,000 mg total) by mouth every 8 (eight) hours as needed for mild pain (pain score 1-3) or moderate pain (pain score 4-6).     albuterol  (VENTOLIN  HFA) 108 (90 Base) MCG/ACT inhaler Inhale 2 puffs into the lungs every 6 (six) hours as needed for wheezing or shortness of breath. 4 each 3   allopurinol  (ZYLOPRIM ) 100 MG tablet Take 1 tablet (100 mg total) by mouth every other day. 45 tablet 1   Biotin  5000 MCG CAPS Take 5,000 mcg by mouth every morning.     Blood Glucose  Monitoring Suppl (TRUE METRIX METER) DEVI Use to test blood sugars once daily. ICD: E11.65     carvedilol  (COREG ) 6.25 MG tablet Take 1 tablet (6.25 mg total) by mouth 2 (two) times daily with a meal. 180 tablet 1   clopidogrel  (PLAVIX ) 75 MG tablet Take 1 tablet (75 mg total) by mouth daily. 90 tablet 1   colchicine  0.6 MG tablet Every 3 days as needed for gout flare up. 30 tablet 1   Continuous Glucose Receiver (FREESTYLE LIBRE 2 READER) DEVI 1 each by Does not apply route as directed. 1 each 0   Continuous Glucose Sensor (FREESTYLE LIBRE 2 SENSOR) MISC 1 each by Does not apply route every 14 (fourteen) days. 6 each 1   empagliflozin  (JARDIANCE ) 25 MG TABS tablet Take 1 tablet (25 mg total) by mouth daily. 90 tablet 1   ezetimibe  (ZETIA ) 10 MG tablet Take 1 tablet (10 mg total) by mouth daily. 90 tablet 3   famotidine  (PEPCID ) 10 MG tablet Take 10 mg by mouth 2 (two) times daily as needed for heartburn or indigestion.      fluticasone (FLONASE) 50 MCG/ACT nasal spray Place 1 spray into both nostrils daily as needed for allergies.     furosemide  (LASIX ) 80 MG tablet TAKE 1 TABLET BY MOUTH twice DAILY 180 tablet 1   gabapentin  (NEURONTIN ) 300 MG capsule Take 1 capsule (300 mg total) by mouth 2 (two) times daily. 180 capsule 1   Glucagon  (GVOKE HYPOPEN  2-PACK) 0.5 MG/0.1ML SOAJ Inject 0.5 mg into the skin daily as needed. 0.2 mL 2   glucose blood (TRUE METRIX BLOOD GLUCOSE TEST) test strip Use as instructed 100 each 12   insulin  NPH-regular Human (70-30) 100 UNIT/ML injection Inject 25-32 Units into the skin daily. Reports 20 units in morning and 10-12 at night if needed. 10 mL 2   isosorbide  mononitrate (IMDUR ) 30 MG 24 hr tablet Take 1 tablet (30 mg total) by mouth daily. 90 tablet 1   levothyroxine  (SYNTHROID ) 75 MCG tablet Take 1 tablet (75 mcg total) by mouth daily before breakfast. 90 tablet 1   linagliptin  (TRADJENTA ) 5 MG TABS tablet Take 1 tablet (5 mg total) by mouth daily. 90 tablet 1    losartan  (COZAAR ) 25 MG tablet Take 1 tablet (25 mg total) by mouth daily. 90 tablet 1   nitroGLYCERIN  (NITROSTAT ) 0.4 MG SL tablet Place 1 tablet (0.4 mg total) under the tongue every 5 (five) minutes as needed for chest pain. 25 tablet 11   pantoprazole  (PROTONIX ) 40 MG tablet Take 1 tablet (40 mg total) by mouth daily. 90 tablet 3   ranolazine  (RANEXA ) 500  MG 12 hr tablet Take 1 tablet (500 mg total) by mouth 2 (two) times daily. 180 tablet 1   traMADol  (ULTRAM ) 50 MG tablet Take 1 tablet (50 mg total) by mouth daily as needed for severe pain (pain score 7-10). 90 tablet 0   TRUEplus Lancets 33G MISC Use to test blood sugar once daily. ICD: E11.65     calcium  carbonate (OSCAL) 1500 (600 Ca) MG TABS tablet Take 600 mg by mouth daily with breakfast. (Patient not taking: Reported on 01/13/2024)     No current facility-administered medications on file prior to visit.   Past Medical History:  Diagnosis Date   Anemia    a. Noted on labs 02/2014 possibly procedurally related.   Asthma    CAD S/P percutaneous coronary angioplasty 01/22/2014   A. (12/2013): POBA - OM2 99% (very tortuous segment) - reduced ~ 50% & TIMI 3 flow, 80-90% stenosis in mid PDA, Irregularities <50% in mRCA, pLAD and prox LCx; b. 02/17/14: Moderate sized fixed defect in Lateral wall --> cath with OM2 restenosis & otherwise stable --> Xience Alpine DES x 2 (2.25 mm x 12 & 8 mm). c. NSTEMI 02/2014 s/p PTCA/balloon angioplasty only to small caliber PDA.   Carotid stenosis    Carotid US  11/17: L 1-39; FU prn   Chronic systolic heart failure (HCC)    a. ICM b. ECHO (01/2014): EF 25-30%, grade I DD, trivial MR. c. EF by Myoview  02/17/14 = ~53%. d. EF by cath 02/24/14 - improved to 50-55%.   CKD (chronic kidney disease), stage III (HCC)    Contrast media allergy    Environmental allergies    GERD (gastroesophageal reflux disease)    Gout, unspecified    HOH (hard of hearing)    Hyperlipidemia    a. H/o intolerance to Zocor, Lipitor . Had  muscle aches on higher dose Crestor .   Hypertension    Hypothyroidism    LBBB (left bundle branch block)    a. Transient during 02/2014 admission.   NSTEMI (non-ST elevated myocardial infarction) (HCC) 2014; 12/2013, 01/2014   QT prolongation    a. Noted on EKG 02/2014.   Sinus bradycardia    a. HR 40s-50s during 02/2014 admission, limiting BB dose.   TIA (transient ischemic attack) 1990's   a. TIA vs stroke 1990's mild - sounds like a TIA as she was told she had stroke symptoms but negative scans. Denies residual effects.   Type II diabetes mellitus [facility])    Past Surgical History:  Procedure Laterality Date   ABDOMINAL HYSTERECTOMY     complete   AMPUTATION TOE Right 02/25/2021   hammer toe.   APPENDECTOMY  1959   CARDIAC CATHETERIZATION  02/24/2014   Procedure: CORONARY BALLOON ANGIOPLASTY;  Surgeon: Lonni JONETTA Cash, MD;  Location: [facility] CATH LAB;  Service: Cardiovascular;;   CARDIAC CATHETERIZATION N/A 04/20/2015   Procedure: Left Heart Cath and Coronary Angiography;  Surgeon: Victory LELON Sharps, MD;  Location: [facility] INVASIVE CV LAB;  Service: Cardiovascular;  Laterality: N/A;   CATARACT EXTRACTION W/ INTRAOCULAR LENS  IMPLANT, BILATERAL Bilateral 2013   CESAREAN SECTION  1959; 1960   CHOLECYSTECTOMY  1989   CORONARY ANGIOPLASTY  12/2013   POBA - OM2   CORONARY ARTERY BYPASS GRAFT N/A 04/25/2015   Procedure: CORONARY ARTERY BYPASS GRAFTING (CABG) x four,  using left internal mammary artery and right leg greater saphenous vein harvested endoscopically;  Surgeon: Dorise MARLA Fellers, MD;  Location: [facility] OR;  Service: Open Heart Surgery;  Laterality:  N/A;   JOINT REPLACEMENT Right 10 years ago    right knee (TKR)   LEFT HEART CATHETERIZATION WITH CORONARY ANGIOGRAM N/A 01/22/2014   Procedure: LEFT HEART CATHETERIZATION WITH CORONARY ANGIOGRAM;  Surgeon: Victory LELON Claudene DOUGLAS, MD;  Location: [facility] CATH LAB;  Service: Cardiovascular;  Laterality: N/A;   LEFT HEART CATHETERIZATION WITH CORONARY ANGIOGRAM  N/A 02/17/2014   Procedure: LEFT HEART CATHETERIZATION WITH CORONARY ANGIOGRAM;  Surgeon: Lonni JONETTA Cash, MD;  Location: [facility] CATH LAB;  Service: Cardiovascular;  Laterality: N/A;   LEFT HEART CATHETERIZATION WITH CORONARY ANGIOGRAM N/A 02/24/2014   Procedure: LEFT HEART CATHETERIZATION WITH CORONARY ANGIOGRAM;  Surgeon: Lonni JONETTA Cash, MD;  Location: [facility] CATH LAB;  Service: Cardiovascular;  Laterality: N/A;   LUMBAR LAMINECTOMY/DECOMPRESSION MICRODISCECTOMY Left 12/22/2014   Procedure: CENTRAL DECOMPRESSIVE LUMBAR, AND LAMINECTOMY L5-S1,  S1-S2 FOR SPINAL STENOSIS FORAMINOTOMY L5, S1, S2 ROOTS ON LEFT FOR FORAMINAL STENOSIS, AND DECOMPRESSION L5-S1, S1-S2 ACCORDING TO LABELING OF [facility] X-RAYS;  Surgeon: Tanda Heading, MD;  Location: [facility] ORS;  Service: Orthopedics;  Laterality: Left;   PERCUTANEOUS CORONARY STENT INTERVENTION (PCI-S)  02/17/2014   For restenosis of OM2 - PCI with Xience Apline DES 2.25 mm x 12 mm & 2.25 mm x 8 mm overlapping   REDUCTION MAMMAPLASTY Bilateral 1990's   SHOULDER OPEN ROTATOR CUFF REPAIR Bilateral 1980's - 1990's   TEE WITHOUT CARDIOVERSION N/A 04/25/2015   Procedure: TRANSESOPHAGEAL ECHOCARDIOGRAM (TEE);  Surgeon: Dorise MARLA Fellers, MD;  Location: [facility] OR;  Service: Open Heart Surgery;  Laterality: N/A;   TOTAL HIP ARTHROPLASTY Left 11/29/2023   TOTAL KNEE ARTHROPLASTY Right 2009   TOTAL KNEE ARTHROPLASTY Left 04/21/2018   TKR;  Surgeon: Rubie Kemps, MD;  Location: [facility] ORS;  Service: Orthopedics;  Laterality: Left;  Adductor Block    Family History  Problem Relation Age of Onset   Hypertension Mother    Diabetes Mother    Heart attack Mother 43   Heart disease Father    Emphysema Father    Diabetes Brother    Heart disease Brother    Cancer Sister    Stroke Neg Hx    Social History   Socioeconomic History   Marital status: Widowed    Spouse name: Not on file   Number of children: 2   Years of education: Not on file   Highest  education level: Not on file  Occupational History   Occupation: Retired  Tobacco Use   Smoking status: Never   Smokeless tobacco: Never  Vaping Use   Vaping status: Never Used  Substance and Sexual Activity   Alcohol use: No   Drug use: No   Sexual activity: Never  Other Topics Concern   Not on file  Social History Narrative   Lives in [place] by herself.    Social Drivers of Corporate investment banker Strain: Low Risk  (10/22/2022)   Overall Financial Resource Strain (CARDIA)    Difficulty of Paying Living Expenses: Not hard at all  Food Insecurity: No Food Insecurity (07/05/2023)   Hunger Vital Sign    Worried About Running Out of Food in the Last Year: Never true    Ran Out of Food in the Last Year: Never true  Transportation Needs: No Transportation Needs (07/05/2023)   PRAPARE - Administrator, Civil Service (Medical): No    Lack of Transportation (Non-Medical): No  Physical Activity: Sufficiently Active (10/22/2022)   Exercise Vital Sign    Days of Exercise per  Week: 6 days    Minutes of Exercise per Session: 40 min  Stress: No Stress Concern Present (10/22/2022)   [employer] of Occupational Health - Occupational Stress Questionnaire    Feeling of Stress : Not at all  Social Connections: Unknown (07/05/2023)   Social Connection and Isolation Panel    Frequency of Communication with Friends and Family: Never    Frequency of Social Gatherings with Friends and Family: Never    Attends Religious Services: Not on Insurance claims handler of Clubs or Organizations: Yes    Attends Banker Meetings: More than 4 times per year    Marital Status: Widowed  Recent Concern: Social Connections - Moderately Isolated (07/05/2023)   Social Connection and Isolation Panel    Frequency of Communication with Friends and Family: Never    Frequency of Social Gatherings with Friends and Family: Never    Attends Religious Services: More than 4 times per year     Active Member of [employer] or Organizations: Yes    Attends Banker Meetings: More than 4 times per year    Marital Status: Widowed    Objective:  BP 108/69   Pulse 68   Temp 97.8 F (36.6 C) (Temporal)   Resp 18   Ht 5' 7 (1.702 m)   Wt 201 lb 9.6 oz (91.4 kg)   SpO2 98%   BMI 31.58 kg/m      01/13/2024    1:46 PM 01/13/2024   10:04 AM 01/06/2024    8:37 AM  BP/Weight  Systolic BP 108 158 130  Diastolic BP 69 64 64  Wt. (Lbs)  201.6 211  BMI  31.58 kg/m2 33.05 kg/m2    Physical Exam Vitals reviewed.  Constitutional:      General: She is not in acute distress.    Appearance: Normal appearance. She is not ill-appearing.  Eyes:     Conjunctiva/sclera: Conjunctivae normal.  Cardiovascular:     Rate and Rhythm: Normal rate and regular rhythm.     Heart sounds: Normal heart sounds. No murmur heard. Pulmonary:     Effort: Pulmonary effort is normal.     Breath sounds: Normal breath sounds. No wheezing.  Musculoskeletal:     Left lower leg: Edema present.  Skin:    General: Skin is warm.  Neurological:     Mental Status: She is alert. Mental status is at baseline.  Psychiatric:        Mood and Affect: Mood normal.        Behavior: Behavior normal.       Lab Results  Component Value Date   WBC 7.9 01/06/2024   HGB 9.5 (L) 01/06/2024   HCT 30.9 (L) 01/06/2024   PLT 278 01/06/2024   GLUCOSE 151 (H) 01/06/2024   CHOL 149 01/06/2024   TRIG 81 01/06/2024   HDL 51 01/06/2024   LDLCALC 82 01/06/2024   ALT 7 01/06/2024   AST 16 01/06/2024   NA 140 01/06/2024   K 5.6 (H) 01/06/2024   CL 99 01/06/2024   CREATININE 2.26 (H) 01/06/2024   BUN 47 (H) 01/06/2024   CO2 23 01/06/2024   TSH 4.900 (H) 01/06/2024   INR 0.94 04/21/2018   HGBA1C 6.1 (H) 01/06/2024      Assessment & Plan:  Pedal edema Assessment & Plan: Chronic leg swelling improved with diuretic therapy. Weight loss of 12 pounds noted. Cardiovascular issues considered due to heart surgery  history. Blood work to  assess cardiovascular status and potassium levels before further diuretic decisions. - Continue Lasix  80 mg twice daily until blood work results are available. - Order CBC, pro-BNP, and BMP to assess cardiovascular status and potassium levels. - Review blood work results to determine if Lasix  can be discontinued.  Orders: -     Pro b natriuretic peptide (BNP)  Chronic kidney disease, stage 4 (severe) (HCC) Assessment & Plan: Elevated Lab Results  Component Value Date   NA 140 01/06/2024   CL 99 01/06/2024   K 5.6 (H) 01/06/2024   CO2 23 01/06/2024   BUN 47 (H) 01/06/2024   CREATININE 2.26 (H) 01/06/2024   EGFR 21 (L) 01/06/2024   CALCIUM  9.4 01/06/2024   PHOS 4.3 11/09/2021   ALBUMIN  4.2 01/06/2024   GLUCOSE 151 (H) 01/06/2024   - Recheck kidney function today, Await labs/testing for assessment and recommendations   Orders: -     Basic metabolic panel with GFR  Serum potassium elevated Assessment & Plan: Elevated potassium likely from supplements and banana intake. Dietary adjustments required. Lab Results  Component Value Date   K 5.6 (H) 01/06/2024    - Avoid bananas. - labs drawn today, Await labs/testing for assessment and recommendations   Orders: -     CBC with Differential/Platelet  Chronic gout without tophus, unspecified cause, unspecified site Assessment & Plan: Continue current medication management as noted from her last visit. - colchicine  0.6 mg every 3 days due to ckd stage 4.  - allopurinol  50 mg one every other day.    Acquired hypothyroidism Assessment & Plan: Elevated thyroid  levels. Synthroid  dose increased to 75 micrograms. New prescription provided. Lab Results  Component Value Date   TSH 4.900 (H) 01/06/2024   - Continue Synthroid  75 micrograms as prescribed      No orders of the defined types were placed in this encounter.   Orders Placed This Encounter  Procedures   CBC with Differential   Basic  Metabolic Panel   Pro b natriuretic peptide (BNP)     Follow-up: Return in about 7 weeks (around 03/02/2024) for chronic with Dr. Sherre.   I,Angela Taylor,acting as a Neurosurgeon for Harrie CHRISTELLA Cedar, FNP.,have documented all relevant documentation on the behalf of Harrie CHRISTELLA Cedar, FNP,as directed by  Harrie CHRISTELLA Cedar, FNP while in the presence of Harrie CHRISTELLA Cedar, FNP.   An After Visit Summary was printed and given to the patient.  I attest that I have reviewed this visit and agree with the plan scribed by my staff.   Harrie CHRISTELLA Cedar, FNP [facility] [phone]

## 2024-01-13 NOTE — Assessment & Plan Note (Signed)
 Elevated potassium likely from supplements and banana intake. Dietary adjustments required. Lab Results  Component Value Date   K 5.6 (H) 01/06/2024    - Avoid bananas. - labs drawn today, Await labs/testing for assessment and recommendations

## 2024-01-14 DIAGNOSIS — S72002A Fracture of unspecified part of neck of left femur, initial encounter for closed fracture: Secondary | ICD-10-CM | POA: Diagnosis not present

## 2024-01-14 LAB — PRO B NATRIURETIC PEPTIDE: NT-Pro BNP: 2432 pg/mL — ABNORMAL HIGH (ref 0–738)

## 2024-01-14 LAB — BASIC METABOLIC PANEL WITH GFR
BUN/Creatinine Ratio: 16 (ref 12–28)
BUN: 29 mg/dL — ABNORMAL HIGH (ref 8–27)
CO2: 26 mmol/L (ref 20–29)
Calcium: 9.6 mg/dL (ref 8.7–10.3)
Chloride: 95 mmol/L — ABNORMAL LOW (ref 96–106)
Creatinine, Ser: 1.83 mg/dL — ABNORMAL HIGH (ref 0.57–1.00)
Glucose: 116 mg/dL — ABNORMAL HIGH (ref 70–99)
Potassium: 4.1 mmol/L (ref 3.5–5.2)
Sodium: 140 mmol/L (ref 134–144)
eGFR: 27 mL/min/1.73 — ABNORMAL LOW (ref >59)

## 2024-01-14 LAB — CBC WITH DIFFERENTIAL/PLATELET
Basophils Absolute: 0.1 x10E3/uL (ref 0.0–0.2)
Basos: 1 %
EOS (ABSOLUTE): 0.1 x10E3/uL (ref 0.0–0.4)
Eos: 1 %
Hematocrit: 30.5 % — ABNORMAL LOW (ref 34.0–46.6)
Hemoglobin: 9.7 g/dL — ABNORMAL LOW (ref 11.1–15.9)
Immature Grans (Abs): 0 x10E3/uL (ref 0.0–0.1)
Immature Granulocytes: 0 %
Lymphocytes Absolute: 1.3 x10E3/uL (ref 0.7–3.1)
Lymphs: 18 %
MCH: 31.5 pg (ref 26.6–33.0)
MCHC: 31.8 g/dL (ref 31.5–35.7)
MCV: 99 fL — ABNORMAL HIGH (ref 79–97)
Monocytes Absolute: 0.6 x10E3/uL (ref 0.1–0.9)
Monocytes: 9 %
Neutrophils Absolute: 5 x10E3/uL (ref 1.4–7.0)
Neutrophils: 71 %
Platelets: 271 x10E3/uL (ref 150–450)
RBC: 3.08 x10E6/uL — ABNORMAL LOW (ref 3.77–5.28)
RDW: 14.5 % (ref 11.7–15.4)
WBC: 7 x10E3/uL (ref 3.4–10.8)

## 2024-01-16 ENCOUNTER — Other Ambulatory Visit: Payer: Self-pay | Admitting: Family Medicine

## 2024-01-16 ENCOUNTER — Ambulatory Visit: Payer: Self-pay | Admitting: Family Medicine

## 2024-01-16 DIAGNOSIS — I2510 Atherosclerotic heart disease of native coronary artery without angina pectoris: Secondary | ICD-10-CM

## 2024-01-20 ENCOUNTER — Ambulatory Visit: Admitting: Family Medicine

## 2024-01-21 ENCOUNTER — Telehealth: Payer: Self-pay

## 2024-01-21 NOTE — Telephone Encounter (Unsigned)
 Copied from CRM #[id number]. Topic: General - Call Back - No Documentation >> Jan 21, 2024  9:46 AM Tobias CROME wrote: Reason for CRM: Patient returning call to office. Patient would like to inform Dr. Sherre her swelling has mostly gone down, patient is walking on her own and she is doing very well.

## 2024-01-21 NOTE — Telephone Encounter (Signed)
 Copied from CRM [id number]. Topic: General - Other >> Jan 21, 2024  3:22 PM Wess RAMAN wrote: Reason for CRM: Therisa (Nurse Care Manager) from [facility] [employer] would like to let Dr. Sherre know that patient has reported low blood sugars of 48 in the morning. She is currently taking 15 units twice per day of insulin   Callback #: [phone]

## 2024-01-22 NOTE — Telephone Encounter (Signed)
 Patient Made Aware, Verbalized Understanding.

## 2024-01-27 ENCOUNTER — Other Ambulatory Visit

## 2024-01-27 ENCOUNTER — Other Ambulatory Visit: Payer: Self-pay

## 2024-01-27 NOTE — Telephone Encounter (Signed)
 Patient informed and verbalized understanding

## 2024-01-28 ENCOUNTER — Other Ambulatory Visit

## 2024-01-28 ENCOUNTER — Other Ambulatory Visit: Payer: Self-pay

## 2024-01-28 DIAGNOSIS — R600 Localized edema: Secondary | ICD-10-CM | POA: Diagnosis not present

## 2024-01-29 LAB — PRO B NATRIURETIC PEPTIDE: NT-Pro BNP: 1711 pg/mL — ABNORMAL HIGH (ref 0–738)

## 2024-02-04 ENCOUNTER — Ambulatory Visit: Payer: Self-pay | Admitting: Family Medicine

## 2024-02-10 ENCOUNTER — Other Ambulatory Visit (HOSPITAL_COMMUNITY): Payer: Self-pay

## 2024-02-10 ENCOUNTER — Other Ambulatory Visit: Payer: Self-pay

## 2024-02-10 ENCOUNTER — Telehealth: Payer: Self-pay

## 2024-02-10 NOTE — Telephone Encounter (Signed)
 Copied from CRM #[id number]. Topic: General - Other >> Feb 10, 2024  8:57 AM Wess RAMAN wrote: Reason for CRM: Deanna from [facility] health would like to let Dr. Sherre know that patient was discharged from skilled home health services as of 02/07/24  Callback #: [phone]

## 2024-02-25 DIAGNOSIS — S72002A Fracture of unspecified part of neck of left femur, initial encounter for closed fracture: Secondary | ICD-10-CM | POA: Diagnosis not present

## 2024-03-01 NOTE — Progress Notes (Unsigned)
 Subjective:  Patient ID: Carol [name], female    DOB: January 11, 1936  Age: 88 y.o. MRN: [id number]  No chief complaint on file.   HPI: Discussed the use of AI scribe software for clinical note transcription with the patient, who gave verbal consent to proceed.  History of Present Illness        07/19/2023    9:54 AM 05/06/2023    2:23 PM 02/18/2023   10:56 AM 10/22/2022    2:45 PM 07/16/2022    9:59 AM  Depression screen PHQ 2/9  Decreased Interest 0 0 0 0 0  Down, Depressed, Hopeless 0 0 0 0 0  PHQ - 2 Score 0 0 0 0 0  Altered sleeping   0    Tired, decreased energy   0    Change in appetite   0    Feeling bad or failure about yourself    0    Trouble concentrating   0    Moving slowly or fidgety/restless   0    Suicidal thoughts   0    PHQ-9 Score   0    Difficult doing work/chores   Not difficult at all          09/30/2023    8:27 AM  Fall Risk   Falls in the past year? 0  Number falls in past yr: 0  Injury with Fall? 0  Risk for fall due to : No Fall Risks  Follow up Falls evaluation completed    Patient Care Team: Sherre Clapper, MD as PCP - General (Internal Medicine) Monetta Redell PARAS, MD as Consulting Physician (Cardiology) Inocencio Soyla Lunger, MD as Consulting Physician (Cardiology) Associates, [facility] Pandora Cadet, [facility] as Pharmacist (Pharmacist)   Review of Systems  Constitutional:  Negative for chills, fatigue and fever.  HENT:  Negative for congestion, ear pain and sore throat.   Respiratory:  Negative for cough and shortness of breath.   Cardiovascular:  Negative for chest pain.  Gastrointestinal:  Negative for abdominal pain, constipation, diarrhea, nausea and vomiting.  Genitourinary:  Negative for dysuria and urgency.  Musculoskeletal:  Negative for arthralgias and myalgias.  Skin:  Negative for rash.  Neurological:  Negative for dizziness and headaches.  Psychiatric/Behavioral:  Negative for dysphoric mood. The patient is not  nervous/anxious.     Current Outpatient Medications on File Prior to Visit  Medication Sig Dispense Refill   acetaminophen  (TYLENOL ) 500 MG tablet Take 2 tablets (1,000 mg total) by mouth every 8 (eight) hours as needed for mild pain (pain score 1-3) or moderate pain (pain score 4-6).     albuterol  (VENTOLIN  HFA) 108 (90 Base) MCG/ACT inhaler Inhale 2 puffs into the lungs every 6 (six) hours as needed for wheezing or shortness of breath. 4 each 3   allopurinol  (ZYLOPRIM ) 100 MG tablet Take 1 tablet (100 mg total) by mouth every other day. 45 tablet 1   Biotin  5000 MCG CAPS Take 5,000 mcg by mouth every morning.     Blood Glucose Monitoring Suppl (TRUE METRIX METER) DEVI Use to test blood sugars once daily. ICD: E11.65     calcium  carbonate (OSCAL) 1500 (600 Ca) MG TABS tablet Take 600 mg by mouth daily with breakfast. (Patient not taking: Reported on 01/13/2024)     carvedilol  (COREG ) 6.25 MG tablet Take 1 tablet (6.25 mg total) by mouth 2 (two) times daily with a meal. 180 tablet 1   clopidogrel  (PLAVIX ) 75 MG tablet Take 1 tablet (  75 mg total) by mouth daily. 90 tablet 1   colchicine  0.6 MG tablet Every 3 days as needed for gout flare up. 30 tablet 1   Continuous Glucose Receiver (FREESTYLE LIBRE 2 READER) DEVI 1 each by Does not apply route as directed. 1 each 0   Continuous Glucose Sensor (FREESTYLE LIBRE 2 SENSOR) MISC 1 each by Does not apply route every 14 (fourteen) days. 6 each 1   empagliflozin  (JARDIANCE ) 25 MG TABS tablet Take 1 tablet (25 mg total) by mouth daily. 90 tablet 1   ezetimibe  (ZETIA ) 10 MG tablet Take 1 tablet (10 mg total) by mouth daily. 90 tablet 3   famotidine  (PEPCID ) 10 MG tablet Take 10 mg by mouth 2 (two) times daily as needed for heartburn or indigestion.      fluticasone (FLONASE) 50 MCG/ACT nasal spray Place 1 spray into both nostrils daily as needed for allergies.     furosemide  (LASIX ) 80 MG tablet TAKE 1 TABLET BY MOUTH twice DAILY 180 tablet 1   gabapentin   (NEURONTIN ) 300 MG capsule Take 1 capsule (300 mg total) by mouth 2 (two) times daily. 180 capsule 1   Glucagon  (GVOKE HYPOPEN  2-PACK) 0.5 MG/0.1ML SOAJ Inject 0.5 mg into the skin daily as needed. 0.2 mL 2   glucose blood (TRUE METRIX BLOOD GLUCOSE TEST) test strip Use as instructed 100 each 12   insulin  NPH-regular Human (70-30) 100 UNIT/ML injection Inject 25-32 Units into the skin daily. Reports 20 units in morning and 10-12 at night if needed. 10 mL 2   isosorbide  mononitrate (IMDUR ) 30 MG 24 hr tablet Take 1 tablet (30 mg total) by mouth daily. 90 tablet 1   levothyroxine  (SYNTHROID ) 75 MCG tablet Take 1 tablet (75 mcg total) by mouth daily before breakfast. 90 tablet 1   linagliptin  (TRADJENTA ) 5 MG TABS tablet Take 1 tablet (5 mg total) by mouth daily. 90 tablet 1   losartan  (COZAAR ) 25 MG tablet Take 1 tablet (25 mg total) by mouth daily. 90 tablet 1   nitroGLYCERIN  (NITROSTAT ) 0.4 MG SL tablet Place 1 tablet (0.4 mg total) under the tongue every 5 (five) minutes as needed for chest pain. 25 tablet 11   pantoprazole  (PROTONIX ) 40 MG tablet Take 1 tablet (40 mg total) by mouth daily. 90 tablet 3   ranolazine  (RANEXA ) 500 MG 12 hr tablet Take 1 tablet (500 mg total) by mouth 2 (two) times daily. 180 tablet 1   traMADol  (ULTRAM ) 50 MG tablet Take 1 tablet (50 mg total) by mouth daily as needed for severe pain (pain score 7-10). 90 tablet 0   TRUEplus Lancets 33G MISC Use to test blood sugar once daily. ICD: E11.65     No current facility-administered medications on file prior to visit.   Past Medical History:  Diagnosis Date   Anemia    a. Noted on labs 02/2014 possibly procedurally related.   Asthma    CAD S/P percutaneous coronary angioplasty 01/22/2014   A. (12/2013): POBA - OM2 99% (very tortuous segment) - reduced ~ 50% & TIMI 3 flow, 80-90% stenosis in mid PDA, Irregularities <50% in mRCA, pLAD and prox LCx; b. 02/17/14: Moderate sized fixed defect in Lateral wall --> cath with OM2  restenosis & otherwise stable --> Xience Alpine DES x 2 (2.25 mm x 12 & 8 mm). c. NSTEMI 02/2014 s/p PTCA/balloon angioplasty only to small caliber PDA.   Carotid stenosis    Carotid US  11/17: L 1-39; FU prn   Chronic  systolic heart failure (HCC)    a. ICM b. ECHO (01/2014): EF 25-30%, grade I DD, trivial MR. c. EF by Myoview  02/17/14 = ~53%. d. EF by cath 02/24/14 - improved to 50-55%.   CKD (chronic kidney disease), stage III (HCC)    Contrast media allergy    Environmental allergies    GERD (gastroesophageal reflux disease)    Gout, unspecified    HOH (hard of hearing)    Hyperlipidemia    a. H/o intolerance to Zocor, Lipitor . Had muscle aches on higher dose Crestor .   Hypertension    Hypothyroidism    LBBB (left bundle branch block)    a. Transient during 02/2014 admission.   NSTEMI (non-ST elevated myocardial infarction) (HCC) 2014; 12/2013, 01/2014   QT prolongation    a. Noted on EKG 02/2014.   Sinus bradycardia    a. HR 40s-50s during 02/2014 admission, limiting BB dose.   TIA (transient ischemic attack) 1990's   a. TIA vs stroke 1990's mild - sounds like a TIA as she was told she had stroke symptoms but negative scans. Denies residual effects.   Type II diabetes mellitus [facility])    Past Surgical History:  Procedure Laterality Date   ABDOMINAL HYSTERECTOMY     complete   AMPUTATION TOE Right 02/25/2021   hammer toe.   APPENDECTOMY  1959   CARDIAC CATHETERIZATION  02/24/2014   Procedure: CORONARY BALLOON ANGIOPLASTY;  Surgeon: Lonni JONETTA Cash, MD;  Location: [facility] CATH LAB;  Service: Cardiovascular;;   CARDIAC CATHETERIZATION N/A 04/20/2015   Procedure: Left Heart Cath and Coronary Angiography;  Surgeon: Victory LELON Sharps, MD;  Location: [facility] INVASIVE CV LAB;  Service: Cardiovascular;  Laterality: N/A;   CATARACT EXTRACTION W/ INTRAOCULAR LENS  IMPLANT, BILATERAL Bilateral 2013   CESAREAN SECTION  1959; 1960   CHOLECYSTECTOMY  1989   CORONARY ANGIOPLASTY  12/2013   POBA - OM2    CORONARY ARTERY BYPASS GRAFT N/A 04/25/2015   Procedure: CORONARY ARTERY BYPASS GRAFTING (CABG) x four,  using left internal mammary artery and right leg greater saphenous vein harvested endoscopically;  Surgeon: Dorise MARLA Fellers, MD;  Location: [facility] OR;  Service: Open Heart Surgery;  Laterality: N/A;   JOINT REPLACEMENT Right 10 years ago    right knee (TKR)   LEFT HEART CATHETERIZATION WITH CORONARY ANGIOGRAM N/A 01/22/2014   Procedure: LEFT HEART CATHETERIZATION WITH CORONARY ANGIOGRAM;  Surgeon: Victory LELON Sharps DOUGLAS, MD;  Location: [facility] CATH LAB;  Service: Cardiovascular;  Laterality: N/A;   LEFT HEART CATHETERIZATION WITH CORONARY ANGIOGRAM N/A 02/17/2014   Procedure: LEFT HEART CATHETERIZATION WITH CORONARY ANGIOGRAM;  Surgeon: Lonni JONETTA Cash, MD;  Location: [facility] CATH LAB;  Service: Cardiovascular;  Laterality: N/A;   LEFT HEART CATHETERIZATION WITH CORONARY ANGIOGRAM N/A 02/24/2014   Procedure: LEFT HEART CATHETERIZATION WITH CORONARY ANGIOGRAM;  Surgeon: Lonni JONETTA Cash, MD;  Location: [facility] CATH LAB;  Service: Cardiovascular;  Laterality: N/A;   LUMBAR LAMINECTOMY/DECOMPRESSION MICRODISCECTOMY Left 12/22/2014   Procedure: CENTRAL DECOMPRESSIVE LUMBAR, AND LAMINECTOMY L5-S1,  S1-S2 FOR SPINAL STENOSIS FORAMINOTOMY L5, S1, S2 ROOTS ON LEFT FOR FORAMINAL STENOSIS, AND DECOMPRESSION L5-S1, S1-S2 ACCORDING TO LABELING OF [facility] X-RAYS;  Surgeon: Tanda Heading, MD;  Location: [facility] ORS;  Service: Orthopedics;  Laterality: Left;   PERCUTANEOUS CORONARY STENT INTERVENTION (PCI-S)  02/17/2014   For restenosis of OM2 - PCI with Xience Apline DES 2.25 mm x 12 mm & 2.25 mm x 8 mm overlapping   REDUCTION MAMMAPLASTY Bilateral 1990's   SHOULDER OPEN  ROTATOR CUFF REPAIR Bilateral 1980's - 1990's   TEE WITHOUT CARDIOVERSION N/A 04/25/2015   Procedure: TRANSESOPHAGEAL ECHOCARDIOGRAM (TEE);  Surgeon: Dorise MARLA Fellers, MD;  Location: [facility] OR;  Service: Open Heart Surgery;  Laterality: N/A;   TOTAL  HIP ARTHROPLASTY Left 11/29/2023   TOTAL KNEE ARTHROPLASTY Right 2009   TOTAL KNEE ARTHROPLASTY Left 04/21/2018   TKR;  Surgeon: Rubie Kemps, MD;  Location: [facility] ORS;  Service: Orthopedics;  Laterality: Left;  Adductor Block    Family History  Problem Relation Age of Onset   Hypertension Mother    Diabetes Mother    Heart attack Mother 66   Heart disease Father    Emphysema Father    Diabetes Brother    Heart disease Brother    Cancer Sister    Stroke Neg Hx    Social History   Socioeconomic History   Marital status: Widowed    Spouse name: Not on file   Number of children: 2   Years of education: Not on file   Highest education level: Not on file  Occupational History   Occupation: Retired  Tobacco Use   Smoking status: Never   Smokeless tobacco: Never  Vaping Use   Vaping status: Never Used  Substance and Sexual Activity   Alcohol use: No   Drug use: No   Sexual activity: Never  Other Topics Concern   Not on file  Social History Narrative   Lives in [place] by herself.    Social Drivers of Corporate investment banker Strain: Low Risk  (10/22/2022)   Overall Financial Resource Strain (CARDIA)    Difficulty of Paying Living Expenses: Not hard at all  Food Insecurity: No Food Insecurity (07/05/2023)   Hunger Vital Sign    Worried About Running Out of Food in the Last Year: Never true    Ran Out of Food in the Last Year: Never true  Transportation Needs: No Transportation Needs (07/05/2023)   PRAPARE - Administrator, Civil Service (Medical): No    Lack of Transportation (Non-Medical): No  Physical Activity: Sufficiently Active (10/22/2022)   Exercise Vital Sign    Days of Exercise per Week: 6 days    Minutes of Exercise per Session: 40 min  Stress: No Stress Concern Present (10/22/2022)   [employer] of Occupational Health - Occupational Stress Questionnaire    Feeling of Stress : Not at all  Social Connections: Unknown (07/05/2023)   Social  Connection and Isolation Panel    Frequency of Communication with Friends and Family: Never    Frequency of Social Gatherings with Friends and Family: Never    Attends Religious Services: Not on Insurance claims handler of Clubs or Organizations: Yes    Attends Banker Meetings: More than 4 times per year    Marital Status: Widowed  Recent Concern: Social Connections - Moderately Isolated (07/05/2023)   Social Connection and Isolation Panel    Frequency of Communication with Friends and Family: Never    Frequency of Social Gatherings with Friends and Family: Never    Attends Religious Services: More than 4 times per year    Active Member of [employer] or Organizations: Yes    Attends Banker Meetings: More than 4 times per year    Marital Status: Widowed    Objective:  There were no vitals taken for this visit.     01/13/2024    1:46 PM 01/13/2024   10:04 AM 01/06/2024  8:37 AM  BP/Weight  Systolic BP 108 158 130  Diastolic BP 69 64 64  Wt. (Lbs)  201.6 211  BMI  31.58 kg/m2 33.05 kg/m2    Physical Exam Vitals reviewed.  Constitutional:      Appearance: Normal appearance.  Neck:     Vascular: No carotid bruit.  Cardiovascular:     Rate and Rhythm: Normal rate and regular rhythm.     Heart sounds: Normal heart sounds.  Pulmonary:     Effort: Pulmonary effort is normal. No respiratory distress.     Breath sounds: Normal breath sounds.  Abdominal:     General: Abdomen is flat. Bowel sounds are normal.     Palpations: Abdomen is soft.     Tenderness: There is no abdominal tenderness.  Neurological:     Mental Status: She is alert and oriented to person, place, and time.  Psychiatric:        Mood and Affect: Mood normal.        Behavior: Behavior normal.     {Perform Simple Foot Exam  Perform Detailed exam:1} {Insert foot Exam (Optional):30965}   Lab Results  Component Value Date   WBC 7.0 01/13/2024   HGB 9.7 (L) 01/13/2024   HCT 30.5 (L)  01/13/2024   PLT 271 01/13/2024   GLUCOSE 116 (H) 01/13/2024   CHOL 149 01/06/2024   TRIG 81 01/06/2024   HDL 51 01/06/2024   LDLCALC 82 01/06/2024   ALT 7 01/06/2024   AST 16 01/06/2024   NA 140 01/13/2024   K 4.1 01/13/2024   CL 95 (L) 01/13/2024   CREATININE 1.83 (H) 01/13/2024   BUN 29 (H) 01/13/2024   CO2 26 01/13/2024   TSH 4.900 (H) 01/06/2024   INR 0.94 04/21/2018   HGBA1C 6.1 (H) 01/06/2024      Assessment & Plan:  Pedal edema  Acquired hypothyroidism  Serum potassium elevated  Chronic kidney disease, stage 4 (severe) (HCC)     Assessment and Plan Assessment & Plan      No orders of the defined types were placed in this encounter.   No orders of the defined types were placed in this encounter.    Follow-up: No follow-ups on file.   I,Shavonne Ambroise,acting as a Neurosurgeon for Abigail Free, MD.,have documented all relevant documentation on the behalf of Abigail Free, MD,as directed by  Abigail Free, MD while in the presence of Abigail Free, MD.   An After Visit Summary was printed and given to the patient.  Abigail Free, MD [facility] [phone]

## 2024-03-02 ENCOUNTER — Other Ambulatory Visit: Payer: Self-pay

## 2024-03-02 ENCOUNTER — Ambulatory Visit (INDEPENDENT_AMBULATORY_CARE_PROVIDER_SITE_OTHER): Admitting: Family Medicine

## 2024-03-02 ENCOUNTER — Encounter: Payer: Self-pay | Admitting: Family Medicine

## 2024-03-02 VITALS — BP 130/64 | HR 62 | Temp 98.1°F | Ht 67.0 in | Wt 191.0 lb

## 2024-03-02 DIAGNOSIS — E039 Hypothyroidism, unspecified: Secondary | ICD-10-CM

## 2024-03-02 DIAGNOSIS — L989 Disorder of the skin and subcutaneous tissue, unspecified: Secondary | ICD-10-CM | POA: Diagnosis not present

## 2024-03-02 DIAGNOSIS — E875 Hyperkalemia: Secondary | ICD-10-CM

## 2024-03-02 DIAGNOSIS — R600 Localized edema: Secondary | ICD-10-CM

## 2024-03-02 DIAGNOSIS — N184 Chronic kidney disease, stage 4 (severe): Secondary | ICD-10-CM

## 2024-03-02 DIAGNOSIS — M1A9XX0 Chronic gout, unspecified, without tophus (tophi): Secondary | ICD-10-CM

## 2024-03-02 MED ORDER — ALLOPURINOL 100 MG PO TABS
100.0000 mg | ORAL_TABLET | ORAL | 1 refills | Status: DC
  Filled 2024-03-02: qty 45, 90d supply, fill #0

## 2024-03-02 NOTE — Patient Instructions (Signed)
  VISIT SUMMARY: Today, we discussed your leg swelling, thyroid  levels, facial skin lesion, diabetes management, and nocturnal heel pain. We made some adjustments to your medications and provided recommendations for further care.  YOUR PLAN: LOWER EXTREMITY EDEMA: Swelling in your legs, likely due to inconsistent use of your diuretic medication (Lasix ). -Take Lasix  consistently, one in the morning and one at night. -Recheck kidney function to monitor the impact of Lasix . -Continue to elevate your legs to help reduce swelling.  HYPOTHYROIDISM: Your thyroid  medication dose was adjusted previously, and we need to reassess your thyroid  function. -Check thyroid  levels to assess current thyroid  function.  TYPE 2 DIABETES MELLITUS: Your blood sugar levels are fluctuating, and you are currently on a 70/30 insulin  regimen. -Continue with your current 70/30 insulin  regimen. -Bring information from the nursing home regarding your diabetes management for review.  FACIAL DERMATOLOGIC LESION: You have a chronic skin lesion on your face that has spread and needs further evaluation. -Refer to a dermatologist for evaluation of the facial lesion.  NOCTURNAL HEEL PAIN: You experience throbbing and burning heel pain at night, which sometimes disrupts your sleep. -Continue taking gabapentin  for pain. -You may take acetaminophen  as advised by your cardiologist.                      Contains text generated by Abridge.                                 Contains text generated by Abridge.

## 2024-03-03 ENCOUNTER — Other Ambulatory Visit

## 2024-03-03 DIAGNOSIS — R600 Localized edema: Secondary | ICD-10-CM | POA: Diagnosis not present

## 2024-03-03 DIAGNOSIS — N184 Chronic kidney disease, stage 4 (severe): Secondary | ICD-10-CM | POA: Diagnosis not present

## 2024-03-03 DIAGNOSIS — E039 Hypothyroidism, unspecified: Secondary | ICD-10-CM | POA: Diagnosis not present

## 2024-03-03 DIAGNOSIS — E875 Hyperkalemia: Secondary | ICD-10-CM | POA: Diagnosis not present

## 2024-03-04 ENCOUNTER — Ambulatory Visit: Payer: Self-pay | Admitting: Family Medicine

## 2024-03-04 LAB — COMPREHENSIVE METABOLIC PANEL WITH GFR
ALT: 8 IU/L (ref 0–32)
AST: 16 IU/L (ref 0–40)
Albumin: 4.1 g/dL (ref 3.7–4.7)
Alkaline Phosphatase: 181 IU/L — ABNORMAL HIGH (ref 44–121)
BUN/Creatinine Ratio: 20 (ref 12–28)
BUN: 41 mg/dL — ABNORMAL HIGH (ref 8–27)
Bilirubin Total: 0.3 mg/dL (ref 0.0–1.2)
CO2: 23 mmol/L (ref 20–29)
Calcium: 9.2 mg/dL (ref 8.7–10.3)
Chloride: 99 mmol/L (ref 96–106)
Creatinine, Ser: 2.1 mg/dL — ABNORMAL HIGH (ref 0.57–1.00)
Globulin, Total: 2.6 g/dL (ref 1.5–4.5)
Glucose: 135 mg/dL — ABNORMAL HIGH (ref 70–99)
Potassium: 4.4 mmol/L (ref 3.5–5.2)
Sodium: 138 mmol/L (ref 134–144)
Total Protein: 6.7 g/dL (ref 6.0–8.5)
eGFR: 23 mL/min/1.73 — ABNORMAL LOW (ref >59)

## 2024-03-04 LAB — CBC WITH DIFFERENTIAL/PLATELET
Basophils Absolute: 0 x10E3/uL (ref 0.0–0.2)
Basos: 1 %
EOS (ABSOLUTE): 0.1 x10E3/uL (ref 0.0–0.4)
Eos: 1 %
Hematocrit: 32.9 % — ABNORMAL LOW (ref 34.0–46.6)
Hemoglobin: 10.5 g/dL — ABNORMAL LOW (ref 11.1–15.9)
Immature Grans (Abs): 0 x10E3/uL (ref 0.0–0.1)
Immature Granulocytes: 0 %
Lymphocytes Absolute: 1.3 x10E3/uL (ref 0.7–3.1)
Lymphs: 21 %
MCH: 29.5 pg (ref 26.6–33.0)
MCHC: 31.9 g/dL (ref 31.5–35.7)
MCV: 92 fL (ref 79–97)
Monocytes Absolute: 0.6 x10E3/uL (ref 0.1–0.9)
Monocytes: 9 %
Neutrophils Absolute: 4.3 x10E3/uL (ref 1.4–7.0)
Neutrophils: 67 %
Platelets: 269 x10E3/uL (ref 150–450)
RBC: 3.56 x10E6/uL — ABNORMAL LOW (ref 3.77–5.28)
RDW: 14.1 % (ref 11.7–15.4)
WBC: 6.3 x10E3/uL (ref 3.4–10.8)

## 2024-03-04 LAB — T4, FREE: Free T4: 0.96 ng/dL (ref 0.82–1.77)

## 2024-03-04 LAB — TSH: TSH: 4.12 u[IU]/mL (ref 0.450–4.500)

## 2024-03-05 ENCOUNTER — Other Ambulatory Visit: Payer: Self-pay

## 2024-03-05 ENCOUNTER — Other Ambulatory Visit: Payer: Self-pay | Admitting: Family Medicine

## 2024-03-05 ENCOUNTER — Other Ambulatory Visit (HOSPITAL_COMMUNITY): Payer: Self-pay

## 2024-03-05 DIAGNOSIS — E1122 Type 2 diabetes mellitus with diabetic chronic kidney disease: Secondary | ICD-10-CM

## 2024-03-05 MED ORDER — FREESTYLE LIBRE 2 READER DEVI
1.0000 | 0 refills | Status: AC
  Filled 2024-03-05: qty 1, 30d supply, fill #0

## 2024-03-05 MED ORDER — FREESTYLE LIBRE 2 SENSOR MISC
1.0000 | 1 refills | Status: AC
  Filled 2024-03-05: qty 6, 84d supply, fill #0
  Filled 2024-06-05: qty 6, 84d supply, fill #1

## 2024-03-05 NOTE — Telephone Encounter (Signed)
 Copied from CRM #[id number]. Topic: Clinical - Medication Refill >> Mar 05, 2024  9:59 AM Emylou G wrote: Medication: Continuous Glucose Receiver (FREESTYLE LIBRE 2 READER) DEVI --- unsure if this is the monitor above.. be she needs the actual monitor Continuous Glucose Sensor (FREESTYLE LIBRE 2 SENSOR) MISC   Has the patient contacted their pharmacy? Yes (Agent: If no, request that the patient contact the pharmacy for the refill. If patient does not wish to contact the pharmacy document the reason why and proceed with request.) (Agent: If yes, when and what did the pharmacy advise?) said to call us   This is the patient's preferred pharmacy:   D[facility] LONG - [facility] [address] [place] [address] Phone: [phone] Fax: [phone]  Is this the correct pharmacy for this prescription? No If no, delete pharmacy and type the correct one.   Has the prescription been filled recently? Yes  Is the patient out of the medication? Yes  Has the patient been seen for an appointment in the last year OR does the patient have an upcoming appointment? Yes  Can we respond through MyChart? Yes  Agent: Please be advised that Rx refills may take up to 3 business days. We ask that you follow-up with your pharmacy.

## 2024-03-27 DIAGNOSIS — L820 Inflamed seborrheic keratosis: Secondary | ICD-10-CM | POA: Diagnosis not present

## 2024-03-27 DIAGNOSIS — L219 Seborrheic dermatitis, unspecified: Secondary | ICD-10-CM | POA: Diagnosis not present

## 2024-03-27 DIAGNOSIS — D485 Neoplasm of uncertain behavior of skin: Secondary | ICD-10-CM | POA: Diagnosis not present

## 2024-04-24 ENCOUNTER — Other Ambulatory Visit: Payer: Self-pay | Admitting: Family Medicine

## 2024-04-24 DIAGNOSIS — D044 Carcinoma in situ of skin of scalp and neck: Secondary | ICD-10-CM | POA: Diagnosis not present

## 2024-04-24 DIAGNOSIS — I2510 Atherosclerotic heart disease of native coronary artery without angina pectoris: Secondary | ICD-10-CM

## 2024-04-24 DIAGNOSIS — M1A9XX0 Chronic gout, unspecified, without tophus (tophi): Secondary | ICD-10-CM

## 2024-04-24 DIAGNOSIS — I5020 Unspecified systolic (congestive) heart failure: Secondary | ICD-10-CM

## 2024-04-24 MED ORDER — ALLOPURINOL 100 MG PO TABS: 100.0000 mg | ORAL_TABLET | ORAL | 1 refills | Status: DC

## 2024-04-24 MED ORDER — INSULIN NPH ISOPHANE & REGULAR (70-30) 100 UNIT/ML ~~LOC~~ SUSP: 25.0000 [IU] | Freq: Every day | SUBCUTANEOUS | 2 refills | Status: DC

## 2024-04-24 MED ORDER — LINAGLIPTIN 5 MG PO TABS: 5.0000 mg | ORAL_TABLET | Freq: Every day | ORAL | 1 refills | Status: DC

## 2024-04-24 MED ORDER — CLOPIDOGREL BISULFATE 75 MG PO TABS: 75.0000 mg | ORAL_TABLET | Freq: Every day | ORAL | 1 refills | Status: AC

## 2024-04-24 MED ORDER — FUROSEMIDE 80 MG PO TABS: ORAL_TABLET | ORAL | 1 refills | Status: DC

## 2024-04-24 MED ORDER — RANOLAZINE ER 500 MG PO TB12: 500.0000 mg | ORAL_TABLET | Freq: Two times a day (BID) | ORAL | 1 refills | Status: DC

## 2024-04-24 NOTE — Telephone Encounter (Signed)
 Copied from CRM #[id number]. Topic: Clinical - Medication Refill >> Apr 24, 2024 11:21 AM Emylou G wrote: Medication: insulin  NPH-regular Human (70-30) 100 UNIT/ML injection allopurinol  (ZYLOPRIM ) 100 MG tablet carvedilol  (COREG ) 6.25 MG tablet furosemide  (LASIX ) 80 MG tablet ranolazine  (RANEXA ) 500 MG 12 hr tablet linagliptin  (TRADJENTA ) 5 MG TABS tablet   Has the patient contacted their pharmacy? No (Agent: If no, request that the patient contact the pharmacy for the refill. If patient does not wish to contact the pharmacy document the reason why and proceed with request.) (Agent: If yes, when and what did the pharmacy advise?)  This is the patient's preferred pharmacy:  [facility] [address] - [place], [place] - [address] [place] [address] Phone: [phone] Fax: [phone]    Is this the correct pharmacy for this prescription? Yes If no, delete pharmacy and type the correct one.   Has the prescription been filled recently? No  Is the patient out of the medication? Yes  Has the patient been seen for an appointment in the last year OR does the patient have an upcoming appointment? Yes  Can we respond through MyChart? No  Agent: Please be advised that Rx refills may take up to 3 business days. We ask that you follow-up with your pharmacy.

## 2024-05-04 ENCOUNTER — Other Ambulatory Visit: Payer: Self-pay | Admitting: Family Medicine

## 2024-05-04 ENCOUNTER — Other Ambulatory Visit: Payer: Self-pay

## 2024-05-04 ENCOUNTER — Telehealth: Payer: Self-pay | Admitting: Family Medicine

## 2024-05-04 ENCOUNTER — Other Ambulatory Visit (HOSPITAL_COMMUNITY): Payer: Self-pay

## 2024-05-04 DIAGNOSIS — I130 Hypertensive heart and chronic kidney disease with heart failure and stage 1 through stage 4 chronic kidney disease, or unspecified chronic kidney disease: Secondary | ICD-10-CM

## 2024-05-04 DIAGNOSIS — I2510 Atherosclerotic heart disease of native coronary artery without angina pectoris: Secondary | ICD-10-CM

## 2024-05-04 DIAGNOSIS — I5020 Unspecified systolic (congestive) heart failure: Secondary | ICD-10-CM

## 2024-05-04 DIAGNOSIS — M1A9XX0 Chronic gout, unspecified, without tophus (tophi): Secondary | ICD-10-CM

## 2024-05-04 MED ORDER — FUROSEMIDE 80 MG PO TABS: 80.0000 mg | ORAL_TABLET | Freq: Two times a day (BID) | ORAL | 1 refills | Status: DC

## 2024-05-04 MED ORDER — CARVEDILOL 6.25 MG PO TABS: 6.2500 mg | ORAL_TABLET | Freq: Two times a day (BID) | ORAL | 1 refills | Status: AC

## 2024-05-04 MED ORDER — ALLOPURINOL 100 MG PO TABS: 100.0000 mg | ORAL_TABLET | ORAL | 1 refills | Status: DC

## 2024-05-04 MED ORDER — RANOLAZINE ER 500 MG PO TB12: 500.0000 mg | ORAL_TABLET | Freq: Two times a day (BID) | ORAL | 1 refills | Status: DC

## 2024-05-04 MED ORDER — INSULIN NPH ISOPHANE & REGULAR (70-30) 100 UNIT/ML ~~LOC~~ SUSP: 25.0000 [IU] | Freq: Every day | SUBCUTANEOUS | 2 refills | Status: DC

## 2024-05-04 MED ORDER — LINAGLIPTIN 5 MG PO TABS: 5.0000 mg | ORAL_TABLET | Freq: Every day | ORAL | 1 refills | Status: DC

## 2024-05-04 NOTE — Telephone Encounter (Signed)
 Copied from CRM #[id number]. Topic: Clinical - Medication Refill >> May 04, 2024  2:41 PM Zebedee SAUNDERS wrote: DARRYLE LONG - [facility]  [address] [place] [address]  Phone: [phone] Fax: [phone]  Please send medication to [facility].

## 2024-05-04 NOTE — Telephone Encounter (Signed)
 Copied from CRM #[id number]. Topic: Clinical - Medication Refill >> Apr 24, 2024 11:21 AM Emylou G wrote: Medication: insulin  NPH-regular Human (70-30) 100 UNIT/ML injection allopurinol  (ZYLOPRIM ) 100 MG tablet carvedilol  (COREG ) 6.25 MG tablet furosemide  (LASIX ) 80 MG tablet ranolazine  (RANEXA ) 500 MG 12 hr tablet linagliptin  (TRADJENTA ) 5 MG TABS tablet   Has the patient contacted their pharmacy? No (Agent: If no, request that the patient contact the pharmacy for the refill. If patient does not wish to contact the pharmacy document the reason why and proceed with request.) (Agent: If yes, when and what did the pharmacy advise?)  This is the patient's preferred pharmacy:  [facility] [address] - [place], [place] - [address] [place] [address] Phone: [phone] Fax: [phone]    Is this the correct pharmacy for this prescription? Yes If no, delete pharmacy and type the correct one.   Has the prescription been filled recently? No  Is the patient out of the medication? Yes  Has the patient been seen for an appointment in the last year OR does the patient have an upcoming appointment? Yes  Can we respond through MyChart? No  Agent: Please be advised that Rx refills may take up to 3 business days. We ask that you follow-up with your pharmacy. >> May 04, 2024  2:41 PM Zebedee SAUNDERS wrote: DARR[facility] LONG - [facility]  [address] [place] [address]  Phone: [phone] Fax: [phone]  Please send medication to [facility].

## 2024-05-05 ENCOUNTER — Other Ambulatory Visit: Payer: Self-pay

## 2024-05-06 ENCOUNTER — Other Ambulatory Visit: Payer: Self-pay

## 2024-05-12 ENCOUNTER — Other Ambulatory Visit: Payer: Self-pay

## 2024-05-12 ENCOUNTER — Other Ambulatory Visit (HOSPITAL_COMMUNITY): Payer: Self-pay

## 2024-05-13 ENCOUNTER — Other Ambulatory Visit (HOSPITAL_COMMUNITY): Payer: Self-pay

## 2024-05-13 MED ORDER — FLUOCINONIDE 0.05 % EX SOLN: CUTANEOUS | 2 refills | Status: DC

## 2024-05-13 MED ORDER — RANOLAZINE ER 500 MG PO TB12: 500.0000 mg | ORAL_TABLET | Freq: Two times a day (BID) | ORAL | 1 refills | Status: AC

## 2024-05-13 MED ORDER — FUROSEMIDE 80 MG PO TABS: 80.0000 mg | ORAL_TABLET | Freq: Two times a day (BID) | ORAL | 1 refills | Status: DC

## 2024-05-13 MED ORDER — ALLOPURINOL 100 MG PO TABS: 100.0000 mg | ORAL_TABLET | ORAL | 1 refills | Status: DC

## 2024-05-13 MED ORDER — LINAGLIPTIN 5 MG PO TABS
5.0000 mg | ORAL_TABLET | Freq: Every day | ORAL | 1 refills | Status: DC
  Filled 2024-05-13: qty 90, 90d supply, fill #0

## 2024-05-13 MED ORDER — INSULIN NPH ISOPHANE & REGULAR (70-30) 100 UNIT/ML ~~LOC~~ SUSP: 25.0000 [IU] | Freq: Every day | SUBCUTANEOUS | 2 refills | Status: DC

## 2024-05-13 MED ORDER — KETOCONAZOLE 2 % EX SHAM: MEDICATED_SHAMPOO | CUTANEOUS | 11 refills | Status: DC

## 2024-05-14 DIAGNOSIS — M25562 Pain in left knee: Secondary | ICD-10-CM | POA: Diagnosis not present

## 2024-05-14 DIAGNOSIS — Z96652 Presence of left artificial knee joint: Secondary | ICD-10-CM | POA: Diagnosis not present

## 2024-05-24 NOTE — Progress Notes (Unsigned)
 Subjective:  Patient ID: Carol [name], female    DOB: 04/23/1936  Age: 88 y.o. MRN: [id number]  No chief complaint on file.   HPI: Discussed the use of AI scribe software for clinical note transcription with the patient, who gave verbal consent to proceed.  History of Present Illness        07/19/2023    9:54 AM 05/06/2023    2:23 PM 02/18/2023   10:56 AM 10/22/2022    2:45 PM 07/16/2022    9:59 AM  Depression screen PHQ 2/9  Decreased Interest 0 0 0 0 0  Down, Depressed, Hopeless 0 0 0 0 0  PHQ - 2 Score 0 0 0 0 0  Altered sleeping   0    Tired, decreased energy   0    Change in appetite   0    Feeling bad or failure about yourself    0    Trouble concentrating   0    Moving slowly or fidgety/restless   0    Suicidal thoughts   0    PHQ-9 Score   0     Difficult doing work/chores   Not difficult at all       Data saved with a previous flowsheet row definition        09/30/2023    8:27 AM  Fall Risk   Falls in the past year? 0  Number falls in past yr: 0  Injury with Fall? 0  Risk for fall due to : No Fall Risks  Follow up Falls evaluation completed    Patient Care Team: Sherre Clapper, MD as PCP - General (Internal Medicine) Monetta Redell PARAS, MD as Consulting Physician (Cardiology) Inocencio Soyla Lunger, MD as Consulting Physician (Cardiology) Associates, [facility], [facility] as Pharmacist (Pharmacist)   Review of Systems  Current Outpatient Medications on File Prior to Visit  Medication Sig Dispense Refill   acetaminophen  (TYLENOL ) 500 MG tablet Take 2 tablets (1,000 mg total) by mouth every 8 (eight) hours as needed for mild pain (pain score 1-3) or moderate pain (pain score 4-6).     albuterol  (VENTOLIN  HFA) 108 (90 Base) MCG/ACT inhaler Inhale 2 puffs into the lungs every 6 (six) hours as needed for wheezing or shortness of breath. 4 each 3   allopurinol  (ZYLOPRIM ) 100 MG tablet Take 1 tablet (100 mg total) by mouth every other day. 45 tablet 1    allopurinol  (ZYLOPRIM ) 100 MG tablet Take 1 tablet (100 mg total) by mouth every other day. 45 tablet 1   Biotin  5000 MCG CAPS Take 5,000 mcg by mouth every morning.     Blood Glucose Monitoring Suppl (TRUE METRIX METER) DEVI Use to test blood sugars once daily. ICD: E11.65     calcium  carbonate (OSCAL) 1500 (600 Ca) MG TABS tablet Take 600 mg by mouth daily with breakfast. (Patient not taking: Reported on 01/13/2024)     carvedilol  (COREG ) 6.25 MG tablet Take 1 tablet (6.25 mg total) by mouth 2 (two) times daily with a meal. 180 tablet 1   clopidogrel  (PLAVIX ) 75 MG tablet Take 1 tablet (75 mg total) by mouth daily. 90 tablet 1   colchicine  0.6 MG tablet Every 3 days as needed for gout flare up. 30 tablet 1   Continuous Glucose Receiver (FREESTYLE LIBRE 2 READER) DEVI Use as directed 1 each 0   Continuous Glucose Sensor (FREESTYLE LIBRE 2 SENSOR) MISC Use every 14 (fourteen) days. 6 each 1  empagliflozin  (JARDIANCE ) 25 MG TABS tablet Take 1 tablet (25 mg total) by mouth daily. 90 tablet 1   ezetimibe  (ZETIA ) 10 MG tablet Take 1 tablet (10 mg total) by mouth daily. 90 tablet 3   famotidine  (PEPCID ) 10 MG tablet Take 10 mg by mouth 2 (two) times daily as needed for heartburn or indigestion.      fluocinonide  (LIDEX ) 0.05 % external solution Apply to the affected area twice daily for two weeks as needed. 60 mL 2   fluticasone (FLONASE) 50 MCG/ACT nasal spray Place 1 spray into both nostrils daily as needed for allergies.     furosemide  (LASIX ) 80 MG tablet Take 1 tablet (80 mg total) by mouth 2 (two) times daily. 180 tablet 1   furosemide  (LASIX ) 80 MG tablet Take 1 tablet (80 mg total) by mouth 2 (two) times daily. 180 tablet 1   gabapentin  (NEURONTIN ) 300 MG capsule Take 1 capsule (300 mg total) by mouth 2 (two) times daily. 180 capsule 1   Glucagon  (GVOKE HYPOPEN  2-PACK) 0.5 MG/0.1ML SOAJ Inject 0.5 mg into the skin daily as needed. 0.2 mL 2   glucose blood (TRUE METRIX BLOOD GLUCOSE TEST) test  strip Use as instructed 100 each 12   insulin  NPH-regular Human (70-30) 100 UNIT/ML injection Inject 25-32 Units into the skin daily. Reports 20 units in morning and 10-12 at night if needed. 10 mL 2   insulin  NPH-regular Human (70-30) 100 UNIT/ML injection Inject 25-32 Units into the skin daily. 10 mL 2   isosorbide  mononitrate (IMDUR ) 30 MG 24 hr tablet Take 1 tablet (30 mg total) by mouth daily. 90 tablet 1   ketoconazole  (NIZORAL ) 2 % shampoo Shampoo scalp 1-3 times weekly as needed. 120 mL 11   levothyroxine  (SYNTHROID ) 75 MCG tablet Take 1 tablet (75 mcg total) by mouth daily before breakfast. 90 tablet 1   linagliptin  (TRADJENTA ) 5 MG TABS tablet Take 1 tablet (5 mg total) by mouth daily. 90 tablet 1   linagliptin  (TRADJENTA ) 5 MG TABS tablet Take 1 tablet (5 mg total) by mouth daily. 90 tablet 1   losartan  (COZAAR ) 25 MG tablet Take 1 tablet (25 mg total) by mouth daily. 90 tablet 1   nitroGLYCERIN  (NITROSTAT ) 0.4 MG SL tablet Place 1 tablet (0.4 mg total) under the tongue every 5 (five) minutes as needed for chest pain. 25 tablet 11   pantoprazole  (PROTONIX ) 40 MG tablet Take 1 tablet (40 mg total) by mouth daily. 90 tablet 3   ranolazine  (RANEXA ) 500 MG 12 hr tablet Take 1 tablet (500 mg total) by mouth 2 (two) times daily. 180 tablet 1   ranolazine  (RANEXA ) 500 MG 12 hr tablet Take 1 tablet (500 mg total) by mouth 2 (two) times daily. 180 tablet 1   traMADol  (ULTRAM ) 50 MG tablet Take 1 tablet (50 mg total) by mouth daily as needed for severe pain (pain score 7-10). 90 tablet 0   TRUEplus Lancets 33G MISC Use to test blood sugar once daily. ICD: E11.65     No current facility-administered medications on file prior to visit.   Past Medical History:  Diagnosis Date   Anemia    a. Noted on labs 02/2014 possibly procedurally related.   Asthma    CAD S/P percutaneous coronary angioplasty 01/22/2014   A. (12/2013): POBA - OM2 99% (very tortuous segment) - reduced ~ 50% & TIMI 3 flow, 80-90%  stenosis in mid PDA, Irregularities <50% in mRCA, pLAD and prox LCx; b. 02/17/14: Moderate  sized fixed defect in Lateral wall --> cath with OM2 restenosis & otherwise stable --> Xience Alpine DES x 2 (2.25 mm x 12 & 8 mm). c. NSTEMI 02/2014 s/p PTCA/balloon angioplasty only to small caliber PDA.   Carotid stenosis    Carotid US  11/17: L 1-39; FU prn   Chronic systolic heart failure (HCC)    a. ICM b. ECHO (01/2014): EF 25-30%, grade I DD, trivial MR. c. EF by Myoview  02/17/14 = ~53%. d. EF by cath 02/24/14 - improved to 50-55%.   CKD (chronic kidney disease), stage III (HCC)    Contrast media allergy    Environmental allergies    GERD (gastroesophageal reflux disease)    Gout, unspecified    HOH (hard of hearing)    Hyperlipidemia    a. H/o intolerance to Zocor, Lipitor . Had muscle aches on higher dose Crestor .   Hypertension    Hypothyroidism    LBBB (left bundle branch block)    a. Transient during 02/2014 admission.   NSTEMI (non-ST elevated myocardial infarction) (HCC) 2014; 12/2013, 01/2014   QT prolongation    a. Noted on EKG 02/2014.   Sinus bradycardia    a. HR 40s-50s during 02/2014 admission, limiting BB dose.   TIA (transient ischemic attack) 1990's   a. TIA vs stroke 1990's mild - sounds like a TIA as she was told she had stroke symptoms but negative scans. Denies residual effects.   Type II diabetes mellitus [facility])    Past Surgical History:  Procedure Laterality Date   ABDOMINAL HYSTERECTOMY     complete   AMPUTATION TOE Right 02/25/2021   hammer toe.   APPENDECTOMY  1959   CARDIAC CATHETERIZATION  02/24/2014   Procedure: CORONARY BALLOON ANGIOPLASTY;  Surgeon: Lonni JONETTA Cash, MD;  Location: [facility] CATH LAB;  Service: Cardiovascular;;   CARDIAC CATHETERIZATION N/A 04/20/2015   Procedure: Left Heart Cath and Coronary Angiography;  Surgeon: Victory LELON Sharps, MD;  Location: [facility] INVASIVE CV LAB;  Service: Cardiovascular;  Laterality: N/A;   CATARACT EXTRACTION W/ INTRAOCULAR LENS   IMPLANT, BILATERAL Bilateral 2013   CESAREAN SECTION  1959; 1960   CHOLECYSTECTOMY  1989   CORONARY ANGIOPLASTY  12/2013   POBA - OM2   CORONARY ARTERY BYPASS GRAFT N/A 04/25/2015   Procedure: CORONARY ARTERY BYPASS GRAFTING (CABG) x four,  using left internal mammary artery and right leg greater saphenous vein harvested endoscopically;  Surgeon: Dorise MARLA Fellers, MD;  Location: [facility] OR;  Service: Open Heart Surgery;  Laterality: N/A;   JOINT REPLACEMENT Right 10 years ago    right knee (TKR)   LEFT HEART CATHETERIZATION WITH CORONARY ANGIOGRAM N/A 01/22/2014   Procedure: LEFT HEART CATHETERIZATION WITH CORONARY ANGIOGRAM;  Surgeon: Victory LELON Sharps DOUGLAS, MD;  Location: [facility] CATH LAB;  Service: Cardiovascular;  Laterality: N/A;   LEFT HEART CATHETERIZATION WITH CORONARY ANGIOGRAM N/A 02/17/2014   Procedure: LEFT HEART CATHETERIZATION WITH CORONARY ANGIOGRAM;  Surgeon: Lonni JONETTA Cash, MD;  Location: [facility] CATH LAB;  Service: Cardiovascular;  Laterality: N/A;   LEFT HEART CATHETERIZATION WITH CORONARY ANGIOGRAM N/A 02/24/2014   Procedure: LEFT HEART CATHETERIZATION WITH CORONARY ANGIOGRAM;  Surgeon: Lonni JONETTA Cash, MD;  Location: [facility] CATH LAB;  Service: Cardiovascular;  Laterality: N/A;   LUMBAR LAMINECTOMY/DECOMPRESSION MICRODISCECTOMY Left 12/22/2014   Procedure: CENTRAL DECOMPRESSIVE LUMBAR, AND LAMINECTOMY L5-S1,  S1-S2 FOR SPINAL STENOSIS FORAMINOTOMY L5, S1, S2 ROOTS ON LEFT FOR FORAMINAL STENOSIS, AND DECOMPRESSION L5-S1, S1-S2 ACCORDING TO LABELING OF [facility] X-RAYS;  Surgeon: Tanda  Gioffre, MD;  Location: [facility] ORS;  Service: Orthopedics;  Laterality: Left;   PERCUTANEOUS CORONARY STENT INTERVENTION (PCI-S)  02/17/2014   For restenosis of OM2 - PCI with Xience Apline DES 2.25 mm x 12 mm & 2.25 mm x 8 mm overlapping   REDUCTION MAMMAPLASTY Bilateral 1990's   SHOULDER OPEN ROTATOR CUFF REPAIR Bilateral 1980's - 1990's   TEE WITHOUT CARDIOVERSION N/A 04/25/2015   Procedure:  TRANSESOPHAGEAL ECHOCARDIOGRAM (TEE);  Surgeon: Dorise MARLA Fellers, MD;  Location: [facility] OR;  Service: Open Heart Surgery;  Laterality: N/A;   TOTAL HIP ARTHROPLASTY Left 11/29/2023   TOTAL KNEE ARTHROPLASTY Right 2009   TOTAL KNEE ARTHROPLASTY Left 04/21/2018   TKR;  Surgeon: Rubie Kemps, MD;  Location: [facility] ORS;  Service: Orthopedics;  Laterality: Left;  Adductor Block    Family History  Problem Relation Age of Onset   Hypertension Mother    Diabetes Mother    Heart attack Mother 19   Heart disease Father    Emphysema Father    Diabetes Brother    Heart disease Brother    Cancer Sister    Stroke Neg Hx    Social History   Socioeconomic History   Marital status: Widowed    Spouse name: Not on file   Number of children: 2   Years of education: Not on file   Highest education level: Not on file  Occupational History   Occupation: Retired  Tobacco Use   Smoking status: Never   Smokeless tobacco: Never  Vaping Use   Vaping status: Never Used  Substance and Sexual Activity   Alcohol use: No   Drug use: No   Sexual activity: Never  Other Topics Concern   Not on file  Social History Narrative   Lives in [place] by herself.    Social Drivers of Corporate Investment Banker Strain: Low Risk  (10/22/2022)   Overall Financial Resource Strain (CARDIA)    Difficulty of Paying Living Expenses: Not hard at all  Food Insecurity: No Food Insecurity (07/05/2023)   Hunger Vital Sign    Worried About Running Out of Food in the Last Year: Never true    Ran Out of Food in the Last Year: Never true  Transportation Needs: No Transportation Needs (07/05/2023)   PRAPARE - Administrator, Civil Service (Medical): No    Lack of Transportation (Non-Medical): No  Physical Activity: Sufficiently Active (10/22/2022)   Exercise Vital Sign    Days of Exercise per Week: 6 days    Minutes of Exercise per Session: 40 min  Stress: No Stress Concern Present (10/22/2022)   [employer] of  Occupational Health - Occupational Stress Questionnaire    Feeling of Stress : Not at all  Social Connections: Unknown (07/05/2023)   Social Connection and Isolation Panel    Frequency of Communication with Friends and Family: Never    Frequency of Social Gatherings with Friends and Family: Never    Attends Religious Services: Not on Insurance Claims Handler of Clubs or Organizations: Yes    Attends Banker Meetings: More than 4 times per year    Marital Status: Widowed  Recent Concern: Social Connections - Moderately Isolated (07/05/2023)   Social Connection and Isolation Panel    Frequency of Communication with Friends and Family: Never    Frequency of Social Gatherings with Friends and Family: Never    Attends Religious Services: More than 4 times per year    Active  Member of Clubs or Organizations: Yes    Attends Engineer, Structural: More than 4 times per year    Marital Status: Widowed    Objective:  There were no vitals taken for this visit.     03/02/2024   11:11 AM 01/13/2024    1:46 PM 01/13/2024   10:04 AM  BP/Weight  Systolic BP 130 108 158  Diastolic BP 64 69 64  Wt. (Lbs) 191  201.6  BMI 29.91 kg/m2  31.58 kg/m2    Physical Exam  {Perform Simple Foot Exam  Perform Detailed exam:1} {Insert foot Exam (Optional):30965}   Lab Results  Component Value Date   WBC 6.3 03/03/2024   HGB 10.5 (L) 03/03/2024   HCT 32.9 (L) 03/03/2024   PLT 269 03/03/2024   GLUCOSE 135 (H) 03/03/2024   CHOL 149 01/06/2024   TRIG 81 01/06/2024   HDL 51 01/06/2024   LDLCALC 82 01/06/2024   ALT 8 03/03/2024   AST 16 03/03/2024   NA 138 03/03/2024   K 4.4 03/03/2024   CL 99 03/03/2024   CREATININE 2.10 (H) 03/03/2024   BUN 41 (H) 03/03/2024   CO2 23 03/03/2024   TSH 4.120 03/03/2024   INR 0.94 04/21/2018   HGBA1C 6.1 (H) 01/06/2024    Results for orders placed or performed in visit on 03/02/24  CBC with Differential/Platelet   Collection Time: 03/03/24  10:28 AM  Result Value Ref Range   WBC 6.3 3.4 - 10.8 x10E3/uL   RBC 3.56 (L) 3.77 - 5.28 x10E6/uL   Hemoglobin 10.5 (L) 11.1 - 15.9 g/dL   Hematocrit 67.0 (L) 65.9 - 46.6 %   MCV 92 79 - 97 fL   MCH 29.5 26.6 - 33.0 pg   MCHC 31.9 31.5 - 35.7 g/dL   RDW 85.8 88.2 - 84.5 %   Platelets 269 150 - 450 x10E3/uL   Neutrophils 67 Not Estab. %   Lymphs 21 Not Estab. %   Monocytes 9 Not Estab. %   Eos 1 Not Estab. %   Basos 1 Not Estab. %   Neutrophils Absolute 4.3 1.4 - 7.0 x10E3/uL   Lymphocytes Absolute 1.3 0.7 - 3.1 x10E3/uL   Monocytes Absolute 0.6 0.1 - 0.9 x10E3/uL   EOS (ABSOLUTE) 0.1 0.0 - 0.4 x10E3/uL   Basophils Absolute 0.0 0.0 - 0.2 x10E3/uL   Immature Granulocytes 0 Not Estab. %   Immature Grans (Abs) 0.0 0.0 - 0.1 x10E3/uL  Comprehensive metabolic panel with GFR   Collection Time: 03/03/24 10:28 AM  Result Value Ref Range   Glucose 135 (H) 70 - 99 mg/dL   BUN 41 (H) 8 - 27 mg/dL   Creatinine, Ser 7.89 (H) 0.57 - 1.00 mg/dL   eGFR 23 (L) >40 fO/fpw/8.26   BUN/Creatinine Ratio 20 12 - 28   Sodium 138 134 - 144 mmol/L   Potassium 4.4 3.5 - 5.2 mmol/L   Chloride 99 96 - 106 mmol/L   CO2 23 20 - 29 mmol/L   Calcium  9.2 8.7 - 10.3 mg/dL   Total Protein 6.7 6.0 - 8.5 g/dL   Albumin  4.1 3.7 - 4.7 g/dL   Globulin, Total 2.6 1.5 - 4.5 g/dL   Bilirubin Total 0.3 0.0 - 1.2 mg/dL   Alkaline Phosphatase 181 (H) 44 - 121 IU/L   AST 16 0 - 40 IU/L   ALT 8 0 - 32 IU/L  T4, free   Collection Time: 03/03/24 10:28 AM  Result Value Ref Range  Free T4 0.96 0.82 - 1.77 ng/dL  TSH   Collection Time: 03/03/24 10:28 AM  Result Value Ref Range   TSH 4.120 0.450 - 4.500 uIU/mL  .  Assessment & Plan:   Assessment & Plan Acquired hypothyroidism     Mixed hyperlipidemia     Chronic kidney disease, stage 4 (severe) (HCC)     Ischemic cardiomyopathy - with near resolution of LVEF post MI     Moderate persistent asthma without complication     Type 2 diabetes  mellitus with stage 4 chronic kidney disease, with long-term current use of insulin  (HCC)       There is no height or weight on file to calculate BMI.  Assessment and Plan Assessment & Plan      No orders of the defined types were placed in this encounter.   No orders of the defined types were placed in this encounter.      Follow-up: No follow-ups on file.  An After Visit Summary was printed and given to the patient.  Abigail Free, MD [facility] [phone]

## 2024-05-24 NOTE — Assessment & Plan Note (Signed)
 Carol [name]

## 2024-05-24 NOTE — Assessment & Plan Note (Addendum)
 Management per specialist.

## 2024-05-24 NOTE — Assessment & Plan Note (Signed)
 SABRA

## 2024-05-25 ENCOUNTER — Other Ambulatory Visit: Payer: Self-pay

## 2024-05-25 ENCOUNTER — Ambulatory Visit: Admitting: Family Medicine

## 2024-05-25 ENCOUNTER — Encounter: Payer: Self-pay | Admitting: Family Medicine

## 2024-05-25 ENCOUNTER — Other Ambulatory Visit (HOSPITAL_COMMUNITY): Payer: Self-pay

## 2024-05-25 VITALS — BP 130/62 | HR 60 | Temp 96.9°F | Resp 16 | Ht 67.0 in | Wt 188.0 lb

## 2024-05-25 DIAGNOSIS — E1122 Type 2 diabetes mellitus with diabetic chronic kidney disease: Secondary | ICD-10-CM

## 2024-05-25 DIAGNOSIS — E782 Mixed hyperlipidemia: Secondary | ICD-10-CM

## 2024-05-25 DIAGNOSIS — E039 Hypothyroidism, unspecified: Secondary | ICD-10-CM

## 2024-05-25 DIAGNOSIS — I255 Ischemic cardiomyopathy: Secondary | ICD-10-CM | POA: Diagnosis not present

## 2024-05-25 DIAGNOSIS — J4540 Moderate persistent asthma, uncomplicated: Secondary | ICD-10-CM | POA: Diagnosis not present

## 2024-05-25 DIAGNOSIS — Z794 Long term (current) use of insulin: Secondary | ICD-10-CM | POA: Diagnosis not present

## 2024-05-25 DIAGNOSIS — M1A9XX0 Chronic gout, unspecified, without tophus (tophi): Secondary | ICD-10-CM | POA: Diagnosis not present

## 2024-05-25 DIAGNOSIS — Z85828 Personal history of other malignant neoplasm of skin: Secondary | ICD-10-CM

## 2024-05-25 DIAGNOSIS — N184 Chronic kidney disease, stage 4 (severe): Secondary | ICD-10-CM | POA: Diagnosis not present

## 2024-05-25 DIAGNOSIS — M1711 Unilateral primary osteoarthritis, right knee: Secondary | ICD-10-CM | POA: Diagnosis not present

## 2024-05-25 MED ORDER — GABAPENTIN 100 MG PO CAPS
100.0000 mg | ORAL_CAPSULE | Freq: Two times a day (BID) | ORAL | 1 refills | Status: AC
  Filled 2024-05-25: qty 180, 90d supply, fill #0

## 2024-05-25 MED ORDER — TRAMADOL HCL 50 MG PO TABS
50.0000 mg | ORAL_TABLET | Freq: Every day | ORAL | 0 refills | Status: AC | PRN
  Filled 2024-05-25: qty 90, 90d supply, fill #0

## 2024-05-25 MED ORDER — EZETIMIBE 10 MG PO TABS
10.0000 mg | ORAL_TABLET | Freq: Every day | ORAL | 1 refills | Status: AC
  Filled 2024-05-25: qty 90, 90d supply, fill #0

## 2024-05-25 MED ORDER — ALLOPURINOL 100 MG PO TABS
100.0000 mg | ORAL_TABLET | ORAL | 1 refills | Status: AC
  Filled 2024-05-25: qty 45, 90d supply, fill #0
  Filled 2024-07-16: qty 45, 90d supply, fill #1

## 2024-05-25 MED ORDER — LINAGLIPTIN 5 MG PO TABS
5.0000 mg | ORAL_TABLET | Freq: Every day | ORAL | 1 refills | Status: AC
  Filled 2024-05-25: qty 90, 90d supply, fill #0

## 2024-05-25 NOTE — Assessment & Plan Note (Signed)
 SABRA

## 2024-05-25 NOTE — Patient Instructions (Signed)
  VISIT SUMMARY: During your visit, we discussed your ongoing issues with diabetes, neuropathy, and other health concerns. We adjusted some of your medications and made plans for further treatment and follow-up.  YOUR PLAN: TYPE 2 DIABETES MELLITUS WITH STAGE 4 CHRONIC KIDNEY DISEASE: Your blood glucose levels fluctuate, and you experience low blood sugar at night. Your kidney function is stable. -Continue your current 70/30 insulin  regimen. -Continue taking Tradjenta . - I will review your chart to see if we need to readd Jardiance .  -We ordered blood work to check your current status.  DIABETIC POLYNEUROPATHY: You have severe pain in your right great toe, and the gabapentin  you are taking causes significant side effects of balance problems. -We reduced your gabapentin  dose to 100 mg twice daily.  ISCHEMIC CARDIOMYOPATHY: You are managing this condition with several medications and need a new cardiologist. -We referred you to Dr. Liborio for a cardiology consultation.  CHRONIC GOUT: Your allopurinol  dose needs adjustment due to kidney dysfunction. -We adjusted your allopurinol  to every other day dosing.  HYPOTHYROIDISM: Your thyroid  function has improved with levothyroxine , but you have concerns about its effect on your blood sugar. -Continue taking levothyroxine  75 mcg daily.  MIXED HYPERLIPIDEMIA: This condition is managed with Zetia . -Continue taking Zetia  10 mg daily.  HISTORY OF SKIN CANCER: You have a history of skin cancer and recently had a lesion removed. -Continue follow-up with your dermatologist as scheduled.  RIGHT KNEE OSTEOARTHRITIS: You have significant pain and weakness in your right knee. -recommend trial of physical therapy for right knee strengthening as recommended by orthopedic doctor.  GENERAL HEALTH MAINTENANCE: You declined the flu and COVID vaccines, but you are due for the shingles vaccine. -We recommend you get the shingles vaccine series at the  pharmacy.                      Contains text generated by Abridge.                                 Contains text generated by Abridge.

## 2024-05-26 ENCOUNTER — Ambulatory Visit

## 2024-05-26 ENCOUNTER — Ambulatory Visit: Payer: Self-pay | Admitting: Family Medicine

## 2024-05-26 VITALS — BP 128/66 | HR 62 | Ht 67.0 in | Wt 191.8 lb

## 2024-05-26 DIAGNOSIS — I5020 Unspecified systolic (congestive) heart failure: Secondary | ICD-10-CM | POA: Diagnosis not present

## 2024-05-26 DIAGNOSIS — I130 Hypertensive heart and chronic kidney disease with heart failure and stage 1 through stage 4 chronic kidney disease, or unspecified chronic kidney disease: Secondary | ICD-10-CM | POA: Diagnosis not present

## 2024-05-26 DIAGNOSIS — I2510 Atherosclerotic heart disease of native coronary artery without angina pectoris: Secondary | ICD-10-CM | POA: Diagnosis not present

## 2024-05-26 DIAGNOSIS — E782 Mixed hyperlipidemia: Secondary | ICD-10-CM

## 2024-05-26 DIAGNOSIS — E1142 Type 2 diabetes mellitus with diabetic polyneuropathy: Secondary | ICD-10-CM | POA: Insufficient documentation

## 2024-05-26 LAB — LIPID PANEL
Chol/HDL Ratio: 3.2 ratio (ref 0.0–4.4)
Cholesterol, Total: 170 mg/dL (ref 100–199)
HDL: 53 mg/dL (ref >39)
LDL Chol Calc (NIH): 89 mg/dL (ref 0–99)
Triglycerides: 166 mg/dL — ABNORMAL HIGH (ref 0–149)
VLDL Cholesterol Cal: 28 mg/dL (ref 5–40)

## 2024-05-26 LAB — CBC WITH DIFFERENTIAL/PLATELET
Basophils Absolute: 0 x10E3/uL (ref 0.0–0.2)
Basos: 1 %
EOS (ABSOLUTE): 0.1 x10E3/uL (ref 0.0–0.4)
Eos: 1 %
Hematocrit: 36.9 % (ref 34.0–46.6)
Hemoglobin: 11.5 g/dL (ref 11.1–15.9)
Immature Grans (Abs): 0 x10E3/uL (ref 0.0–0.1)
Immature Granulocytes: 0 %
Lymphocytes Absolute: 1.3 x10E3/uL (ref 0.7–3.1)
Lymphs: 21 %
MCH: 28.9 pg (ref 26.6–33.0)
MCHC: 31.2 g/dL — ABNORMAL LOW (ref 31.5–35.7)
MCV: 93 fL (ref 79–97)
Monocytes Absolute: 0.6 x10E3/uL (ref 0.1–0.9)
Monocytes: 10 %
Neutrophils Absolute: 4.1 x10E3/uL (ref 1.4–7.0)
Neutrophils: 67 %
Platelets: 259 x10E3/uL (ref 150–450)
RBC: 3.98 x10E6/uL (ref 3.77–5.28)
RDW: 15.9 % — ABNORMAL HIGH (ref 11.7–15.4)
WBC: 6.1 x10E3/uL (ref 3.4–10.8)

## 2024-05-26 LAB — COMPREHENSIVE METABOLIC PANEL WITH GFR
ALT: 11 IU/L (ref 0–32)
AST: 16 IU/L (ref 0–40)
Albumin: 4.1 g/dL (ref 3.7–4.7)
Alkaline Phosphatase: 173 IU/L — ABNORMAL HIGH (ref 48–129)
BUN/Creatinine Ratio: 19 (ref 12–28)
BUN: 27 mg/dL (ref 8–27)
Bilirubin Total: 0.5 mg/dL (ref 0.0–1.2)
CO2: 24 mmol/L (ref 20–29)
Calcium: 9.4 mg/dL (ref 8.7–10.3)
Chloride: 103 mmol/L (ref 96–106)
Creatinine, Ser: 1.41 mg/dL — ABNORMAL HIGH (ref 0.57–1.00)
Globulin, Total: 2.9 g/dL (ref 1.5–4.5)
Glucose: 179 mg/dL — ABNORMAL HIGH (ref 70–99)
Potassium: 4.1 mmol/L (ref 3.5–5.2)
Sodium: 144 mmol/L (ref 134–144)
Total Protein: 7 g/dL (ref 6.0–8.5)
eGFR: 36 mL/min/1.73 — ABNORMAL LOW (ref >59)

## 2024-05-26 LAB — MICROALBUMIN / CREATININE URINE RATIO
Creatinine, Urine: 115.4 mg/dL
Microalb/Creat Ratio: 381 mg/g{creat} — ABNORMAL HIGH (ref 0–29)
Microalbumin, Urine: 440.2 ug/mL

## 2024-05-26 LAB — HEMOGLOBIN A1C
Est. average glucose Bld gHb Est-mCnc: 148 mg/dL
Hgb A1c MFr Bld: 6.8 % — ABNORMAL HIGH (ref 4.8–5.6)

## 2024-05-26 NOTE — Addendum Note (Signed)
 Addended by: GLENFORD ALAN CROME on: 05/26/2024 11:38 AM   Modules accepted: Orders

## 2024-05-26 NOTE — Patient Instructions (Signed)
 Medication Instructions:  **Please call us  to confirm your Lasix  dose. Your physician recommends that you continue on your current medications as directed. Please refer to the Current Medication list given to you today.  *If you need a refill on your cardiac medications before your next appointment, please call your pharmacy*   Lab Work: None ordered If you have labs (blood work) drawn today and your tests are completely normal, you will receive your results only by: MyChart Message (if you have MyChart) OR A paper copy in the mail If you have any lab test that is abnormal or we need to change your treatment, we will call you to review the results.   Testing/Procedures: None ordered   Follow-Up: At [facility], you and your health needs are our priority.  As part of our continuing mission to provide you with exceptional heart care, we have created designated Provider Care Teams.  These Care Teams include your primary Cardiologist (physician) and Advanced Practice Providers (APPs -  Physician Assistants and Nurse Practitioners) who all work together to provide you with the care you need, when you need it.  We recommend signing up for the patient portal called MyChart.  Sign up information is provided on this After Visit Summary.  MyChart is used to connect with patients for Virtual Visits (Telemedicine).  Patients are able to view lab/test results, encounter notes, upcoming appointments, etc.  Non-urgent messages can be sent to your provider as well.   To learn more about what you can do with MyChart, go to [email or web address].    Your next appointment:   6 month(s)  The format for your next appointment:   In Person  Provider:   Alean Kobus, MD    Other Instructions none  Important Information About Sugar

## 2024-05-26 NOTE — Progress Notes (Signed)
 Cardiology Consultation:    Date:  05/26/2024   ID:  [name], DOB Nov 05, 1935, MRN [id number]  PCP:  Carol Clapper, MD  Cardiologist:  Carol SAUNDERS Mailen Newborn, MD   Referring MD: Carol Clapper, MD   No chief complaint on file.    ASSESSMENT AND PLAN:   Carol [name] 88 year old [name] seen today here to establish care, was previously following up with my partners in [place]. history of CAD with prior NSTEMI and multiple PCI in 2015, followed by s/p CABG October 2016 [LIMA-LAD, SVG-OM 2/OM 3, SVG-RCA], chronic CHF with improved LVEF, labile blood pressures and syncopal episodes in the past, diabetes mellitus, hyperlipidemia, hypertension, prior TIA, hard of hearing. Stress test nuclear imaging from February 2024 noted prior lateral infarct without any evidence of ischemia. Transthoracic echocardiogram January 2025 with LVEF 60 to 65%, mild LVH, grade 1 diastolic dysfunction, normal RV function, moderately dilated left atrium   Problem List Items Addressed This Visit     Hyperlipidemia   Recent lipid panel from 05/25/2024 total cholesterol 170, triglycerides 166, HDL 53 and LDL 89.  Not optimal considering her history. Target LDL less than 70 mg/dL.  Did not tolerate statins due to severe muscle aches. Was previously taking PCSK9 inhibitors but discontinued as she was having trouble taking it and felt there were side effects associated including muscle aches. Has been taking Zetia  and wants to continue with it 10 mg once daily.  Recommend continuing with aggressive dietary modifications. If repeat lipid panel in 6 months continues to show elevated LDL levels, will discuss options for Leqvio [inclisiran] 5-month injections.       CAD in native artery   CAD s/p prior NSTEMI s/p POBA to OM2 in 12/2013, DES to OM2 01/2014, POBA to PDA in 02/2014   followed by s/p CABG October 2016 [LIMA-LAD, SVG-OM 2/OM 3, SVG-RCA]  Good functional status at baseline. Asymptomatic. Continue  Plavix  75 mg once daily.  Continue lipid-lowering therapy as discussed under hyperlipidemia.  On antianginal regimen with carvedilol  6.25 mg twice daily On low-dose Imdur  30 mg once daily. Ranexa  500 mg twice daily. Other antianginal medicines suggest calcium  channel blocker are limited due to her history of orthostatic blood pressure changes and falls.      Heart failure with improved ejection fraction (HFimpEF) (HCC) - Primary   CHF, recovered LVEF, was low as 25 to 30% initially, subsequently recovered and has remained stable.  Last echocardiogram January 2025 EF 60 to 65%.   Compensated Mildly hypervolemic with trace ankle edema. NYHA class II.  Continue with salt restriction below 2 g/day. Continue with loop diuretic, she believes she is on Lasix  at home I do not see the medication name in her chart. She will confirm when she goes home. She has been taking the diuretic once a day in the morning and an additional day use in the evening as needed for any significant pedal edema.  Guideline directed medical therapy has been limited due to labile blood pressures and renal dysfunction. Currently remains on carvedilol  6.25 mg twice daily and tolerating well. With normalized biventricular function and compensated state we will hold off on further escalating the therapy.  Was previously on Jardiance  but however with renal dysfunction this was discontinued. Can hold off on adding SGLT2 inhibitors at this time. Now that renal function has recovered, if needed for diabetes management this can be considered once again.  Will defer this to her PCP.      Relevant Orders  EKG 12-Lead (Completed)   Hypertensive heart and renal disease   Well-controlled blood pressures. Has tendency for labile blood pressures. Tolerating carvedilol  and Imdur       Return to clinic in 6 months.  History of Present Illness:    Carol [name] is a 88 y.o. female who is being seen today for follow-up  visit. PCP is Cox, Abigail, MD.  Previously followed up with Carol [name] at our office last visit February 2024. Last visit with cardiology 03/29/2023 with Carol [name] at heart care at [facility].  Carol [name] here for the visit today by herself.  Lives by herself in a townhome.  Has a son who lives nearby and helps her out as needed.  She is able to drive herself and manages her own medications and activities of daily living.  Has history of CAD with prior NSTEMI and multiple PCI in 2015, followed by s/p CABG October 2016 [LIMA-LAD, SVG-OM 2/OM 3, SVG-RCA], chronic CHF with improved LVEF, labile blood pressures and syncopal episodes in the past, diabetes mellitus, hyperlipidemia, hypertension, prior TIA, hard of hearing. Stress test nuclear imaging from February 2024 noted prior lateral infarct without any evidence of ischemia. Transthoracic echocardiogram January 2025 with LVEF 60 to 65%, mild LVH, grade 1 diastolic dysfunction, normal RV function, moderately dilated left atrium,  Mentions overall she has been doing well. No active cardiac symptoms. Denies any chest pain, shortness of breath, orthopnea or paroxysmal nocturnal dyspnea. Does have bilateral lower extremity edema towards the end of the day. Uses Lasix  once a day in the morning and takes additional doses in the evening on an as-needed basis. Dose of the Lasix  currently she has at home is not available on her med list.  She is going to confirm the dose 2 hours after she goes home.  Denies any blood in urine or stools. Denies any palpitations, lightheadedness, dizziness.  She sustained a couple falls this year 1 was in January where she passed out and the other in June where she felt lightheaded and fell down without losing consciousness. No further episodes since June.  EKG in the clinic today shows sinus rhythm heart rate 62/min, PR interval 282 ms prolonged, QRS duration 90 ms, QTc 450 ms.  No ischemic  changes.  Blood work from 05/25/2024 notes hemoglobin A1c 6.8 Lipid panel   CMP with BUN 27, creatinine 1.41, eGFR 36 Sodium 144 and potassium 4.1. Normal transaminases. Alkaline phosphatase mildly elevated 173 CBC with hemoglobin 11.5, hematocrit 36.9, platelets 259 and WBC 6.1. Hemoglobin A1C was 6.8  Thyroid  panel TSH from 03/03/2024 was normal 4.12 Previous labs from 03/03/2024 BUN 41, creatinine 2.1, eGFR 23, consistent with CKD stage IV Anemia with hemoglobin 10.5 and hematocrit 32.9. NT proBNP 1711  elevated  Past Medical History:  Diagnosis Date   Anemia    a. Noted on labs 02/2014 possibly procedurally related.   Asthma    CAD S/P percutaneous coronary angioplasty 01/22/2014   A. (12/2013): POBA - OM2 99% (very tortuous segment) - reduced ~ 50% & TIMI 3 flow, 80-90% stenosis in mid PDA, Irregularities <50% in mRCA, pLAD and prox LCx; b. 02/17/14: Moderate sized fixed defect in Lateral wall --> cath with OM2 restenosis & otherwise stable --> Xience Alpine DES x 2 (2.25 mm x 12 & 8 mm). c. NSTEMI 02/2014 s/p PTCA/balloon angioplasty only to small caliber PDA.   Carotid stenosis    Carotid US  11/17: L 1-39; FU prn   Chronic systolic heart failure (  HCC)    a. ICM b. ECHO (01/2014): EF 25-30%, grade I DD, trivial MR. c. EF by Myoview  02/17/14 = ~53%. d. EF by cath 02/24/14 - improved to 50-55%.   CKD (chronic kidney disease), stage III (HCC)    Contrast media allergy    Environmental allergies    GERD (gastroesophageal reflux disease)    Gout, unspecified    HOH (hard of hearing)    Hyperlipidemia    a. H/o intolerance to Zocor, Lipitor . Had muscle aches on higher dose Crestor .   Hypertension    Hypothyroidism    LBBB (left bundle branch block)    a. Transient during 02/2014 admission.   NSTEMI (non-ST elevated myocardial infarction) (HCC) 2014; 12/2013, 01/2014   QT prolongation    a. Noted on EKG 02/2014.   Sinus bradycardia    a. HR 40s-50s during 02/2014 admission, limiting BB dose.    TIA (transient ischemic attack) 1990's   a. TIA vs stroke 1990's mild - sounds like a TIA as she was told she had stroke symptoms but negative scans. Denies residual effects.   Type II diabetes mellitus [facility])     Past Surgical History:  Procedure Laterality Date   ABDOMINAL HYSTERECTOMY     complete   AMPUTATION TOE Right 02/25/2021   hammer toe.   APPENDECTOMY  1959   CARDIAC CATHETERIZATION  02/24/2014   Procedure: CORONARY BALLOON ANGIOPLASTY;  Surgeon: Lonni JONETTA Cash, MD;  Location: [facility] CATH LAB;  Service: Cardiovascular;;   CARDIAC CATHETERIZATION N/A 04/20/2015   Procedure: Left Heart Cath and Coronary Angiography;  Surgeon: Victory LELON Sharps, MD;  Location: [facility] INVASIVE CV LAB;  Service: Cardiovascular;  Laterality: N/A;   CATARACT EXTRACTION W/ INTRAOCULAR LENS  IMPLANT, BILATERAL Bilateral 2013   CESAREAN SECTION  1959; 1960   CHOLECYSTECTOMY  1989   CORONARY ANGIOPLASTY  12/2013   POBA - OM2   CORONARY ARTERY BYPASS GRAFT N/A 04/25/2015   Procedure: CORONARY ARTERY BYPASS GRAFTING (CABG) x four,  using left internal mammary artery and right leg greater saphenous vein harvested endoscopically;  Surgeon: Dorise MARLA Fellers, MD;  Location: [facility] OR;  Service: Open Heart Surgery;  Laterality: N/A;   JOINT REPLACEMENT Right 10 years ago    right knee (TKR)   LEFT HEART CATHETERIZATION WITH CORONARY ANGIOGRAM N/A 01/22/2014   Procedure: LEFT HEART CATHETERIZATION WITH CORONARY ANGIOGRAM;  Surgeon: Victory LELON Sharps DOUGLAS, MD;  Location: [facility] CATH LAB;  Service: Cardiovascular;  Laterality: N/A;   LEFT HEART CATHETERIZATION WITH CORONARY ANGIOGRAM N/A 02/17/2014   Procedure: LEFT HEART CATHETERIZATION WITH CORONARY ANGIOGRAM;  Surgeon: Lonni JONETTA Cash, MD;  Location: [facility] CATH LAB;  Service: Cardiovascular;  Laterality: N/A;   LEFT HEART CATHETERIZATION WITH CORONARY ANGIOGRAM N/A 02/24/2014   Procedure: LEFT HEART CATHETERIZATION WITH CORONARY ANGIOGRAM;  Surgeon: Lonni JONETTA Cash, MD;  Location: [facility] CATH LAB;  Service: Cardiovascular;  Laterality: N/A;   LUMBAR LAMINECTOMY/DECOMPRESSION MICRODISCECTOMY Left 12/22/2014   Procedure: CENTRAL DECOMPRESSIVE LUMBAR, AND LAMINECTOMY L5-S1,  S1-S2 FOR SPINAL STENOSIS FORAMINOTOMY L5, S1, S2 ROOTS ON LEFT FOR FORAMINAL STENOSIS, AND DECOMPRESSION L5-S1, S1-S2 ACCORDING TO LABELING OF [facility] X-RAYS;  Surgeon: Tanda Heading, MD;  Location: [facility] ORS;  Service: Orthopedics;  Laterality: Left;   PERCUTANEOUS CORONARY STENT INTERVENTION (PCI-S)  02/17/2014   For restenosis of OM2 - PCI with Xience Apline DES 2.25 mm x 12 mm & 2.25 mm x 8 mm overlapping   REDUCTION MAMMAPLASTY Bilateral 1990's   SHOULDER OPEN ROTATOR  CUFF REPAIR Bilateral 1980's - 1990's   TEE WITHOUT CARDIOVERSION N/A 04/25/2015   Procedure: TRANSESOPHAGEAL ECHOCARDIOGRAM (TEE);  Surgeon: Dorise MARLA Fellers, MD;  Location: [facility] OR;  Service: Open Heart Surgery;  Laterality: N/A;   TOTAL HIP ARTHROPLASTY Left 11/29/2023   TOTAL KNEE ARTHROPLASTY Right 2009   TOTAL KNEE ARTHROPLASTY Left 04/21/2018   TKR;  Surgeon: Rubie Kemps, MD;  Location: [facility] ORS;  Service: Orthopedics;  Laterality: Left;  Adductor Block    Current Medications: No outpatient medications have been marked as taking for the 05/26/24 encounter (Office Visit) with Natalyah Cummiskey, Carol SAUNDERS, MD.     Allergies:   Iodinated contrast media, Atorvastatin , Corticosteroids, Hydrocodone , Other, Oxycodone , Simvastatin, Cortizone-10 [hydrocortisone], and Ozempic  (0.25 or 0.5 mg-dose) [semaglutide (0.25 or 0.5mg -dos)]   Social History   Socioeconomic History   Marital status: Widowed    Spouse name: Not on file   Number of children: 2   Years of education: Not on file   Highest education level: Not on file  Occupational History   Occupation: Retired  Tobacco Use   Smoking status: Never   Smokeless tobacco: Never  Vaping Use   Vaping status: Never Used  Substance and Sexual Activity   Alcohol  use: No   Drug use: No   Sexual activity: Never  Other Topics Concern   Not on file  Social History Narrative   Lives in [place] by herself.    Social Drivers of Corporate Investment Banker Strain: Low Risk  (10/22/2022)   Overall Financial Resource Strain (CARDIA)    Difficulty of Paying Living Expenses: Not hard at all  Food Insecurity: No Food Insecurity (07/05/2023)   Hunger Vital Sign    Worried About Running Out of Food in the Last Year: Never true    Ran Out of Food in the Last Year: Never true  Transportation Needs: No Transportation Needs (07/05/2023)   PRAPARE - Administrator, Civil Service (Medical): No    Lack of Transportation (Non-Medical): No  Physical Activity: Sufficiently Active (10/22/2022)   Exercise Vital Sign    Days of Exercise per Week: 6 days    Minutes of Exercise per Session: 40 min  Stress: No Stress Concern Present (10/22/2022)   [employer] of Occupational Health - Occupational Stress Questionnaire    Feeling of Stress : Not at all  Social Connections: Unknown (07/05/2023)   Social Connection and Isolation Panel    Frequency of Communication with Friends and Family: Never    Frequency of Social Gatherings with Friends and Family: Never    Attends Religious Services: Not on Insurance Claims Handler of Clubs or Organizations: Yes    Attends Banker Meetings: More than 4 times per year    Marital Status: Widowed  Recent Concern: Social Connections - Moderately Isolated (07/05/2023)   Social Connection and Isolation Panel    Frequency of Communication with Friends and Family: Never    Frequency of Social Gatherings with Friends and Family: Never    Attends Religious Services: More than 4 times per year    Active Member of [employer] or Organizations: Yes    Attends Banker Meetings: More than 4 times per year    Marital Status: Widowed     Family History: The patient's family history includes Cancer in her sister;  Diabetes in her brother and mother; Emphysema in her father; Heart attack (age of onset: 37) in her mother; Heart disease in her brother  and father; Hypertension in her mother. There is no history of Stroke. ROS:   Please see the history of present illness.    All 14 point review of systems negative except as described per history of present illness.  EKGs/Labs/Other Studies Reviewed:    The following studies were reviewed today:   EKG:  EKG Interpretation Date/Time:  Tuesday May 26 2024 09:19:02 EST Ventricular Rate:  62 PR Interval:  282 QRS Duration:  90 QT Interval:  444 QTC Calculation: 450 R Axis:   134  Text Interpretation: Sinus rhythm with 1st degree A-V block with Premature atrial complexes Right axis deviation Nonspecific ST abnormality When compared with ECG of 04-Jul-2023 14:18, PREVIOUS ECG IS PRESENT Confirmed by Liborio Hai reddy [phone]) on 05/26/2024 9:54:29 AM    Recent Labs: 01/28/2024: NT-Pro BNP 1,711 03/03/2024: ALT 8; BUN 41; Creatinine, Ser 2.10; Hemoglobin 10.5; Platelets 269; Potassium 4.4; Sodium 138; TSH 4.120  Recent Lipid Panel    Component Value Date/Time   CHOL 170 05/25/2024 0940   TRIG 166 (H) 05/25/2024 0940   HDL 53 05/25/2024 0940   CHOLHDL 3.2 05/25/2024 0940   CHOLHDL 4 07/26/2014 1115   VLDL 39.0 07/26/2014 1115   LDLCALC 89 05/25/2024 0940    Physical Exam:    VS:  BP 128/66   Pulse 62   Ht 5' 7 (1.702 m)   Wt 191 lb 12.8 oz (87 kg)   SpO2 96%   BMI 30.04 kg/m     Wt Readings from Last 3 Encounters:  05/26/24 191 lb 12.8 oz (87 kg)  05/25/24 188 lb (85.3 kg)  03/02/24 191 lb (86.6 kg)     GENERAL:  Well nourished, well developed in no acute distress NECK: No JVD; No carotid bruits CARDIAC: RRR, S1 and S2 present, no murmurs, no rubs, no gallops CHEST:  Clear to auscultation without rales, wheezing or rhonchi  Extremities: Trace bilateral pitting ankle edema. Pulses bilaterally symmetric with radial 2+ and  dorsalis pedis 2+ NEUROLOGIC:  Alert and oriented x 3  Medication Adjustments/Labs and Tests Ordered: Current medicines are reviewed at length with the patient today.  Concerns regarding medicines are outlined above.  Orders Placed This Encounter  Procedures   EKG 12-Lead   No orders of the defined types were placed in this encounter.   Signed, Hai jess Liborio, MD, MPH, [facility]. 05/26/2024 10:39 AM    [facility]

## 2024-05-26 NOTE — Assessment & Plan Note (Addendum)
 CAD s/p prior NSTEMI s/p POBA to OM2 in 12/2013, DES to OM2 01/2014, POBA to PDA in 02/2014   followed by s/p CABG October 2016 [LIMA-LAD, SVG-OM 2/OM 3, SVG-RCA]  Good functional status at baseline. Asymptomatic. Continue Plavix  75 mg once daily.  Continue lipid-lowering therapy as discussed under hyperlipidemia.  On antianginal regimen with carvedilol  6.25 mg twice daily On low-dose Imdur  30 mg once daily. Ranexa  500 mg twice daily. Other antianginal medicines suggest calcium  channel blocker are limited due to her history of orthostatic blood pressure changes and falls.

## 2024-05-26 NOTE — Assessment & Plan Note (Signed)
 Well-controlled blood pressures. Has tendency for labile blood pressures. Tolerating carvedilol  and Imdur 

## 2024-05-26 NOTE — Assessment & Plan Note (Signed)
 CHF, recovered LVEF, was low as 25 to 30% initially, subsequently recovered and has remained stable.  Last echocardiogram January 2025 EF 60 to 65%.   Compensated Mildly hypervolemic with trace ankle edema. NYHA class II.  Continue with salt restriction below 2 g/day. Continue with loop diuretic, she believes she is on Lasix  at home I do not see the medication name in her chart. She will confirm when she goes home. She has been taking the diuretic once a day in the morning and an additional day use in the evening as needed for any significant pedal edema.  Guideline directed medical therapy has been limited due to labile blood pressures and renal dysfunction. Currently remains on carvedilol  6.25 mg twice daily and tolerating well. With normalized biventricular function and compensated state we will hold off on further escalating the therapy.  Was previously on Jardiance  but however with renal dysfunction this was discontinued. Can hold off on adding SGLT2 inhibitors at this time. Now that renal function has recovered, if needed for diabetes management this can be considered once again.  Will defer this to her PCP.

## 2024-05-26 NOTE — Assessment & Plan Note (Signed)
 Recent lipid panel from 05/25/2024 total cholesterol 170, triglycerides 166, HDL 53 and LDL 89.  Not optimal considering her history. Target LDL less than 70 mg/dL.  Did not tolerate statins due to severe muscle aches. Was previously taking PCSK9 inhibitors but discontinued as she was having trouble taking it and felt there were side effects associated including muscle aches. Has been taking Zetia  and wants to continue with it 10 mg once daily.  Recommend continuing with aggressive dietary modifications. If repeat lipid panel in 6 months continues to show elevated LDL levels, will discuss options for Leqvio [inclisiran] 64-month injections.

## 2024-05-27 DIAGNOSIS — S72002A Fracture of unspecified part of neck of left femur, initial encounter for closed fracture: Secondary | ICD-10-CM | POA: Diagnosis not present

## 2024-05-28 DIAGNOSIS — E113592 Type 2 diabetes mellitus with proliferative diabetic retinopathy without macular edema, left eye: Secondary | ICD-10-CM | POA: Diagnosis not present

## 2024-05-28 LAB — OPHTHALMOLOGY REPORT-SCANNED

## 2024-05-29 DIAGNOSIS — Z85828 Personal history of other malignant neoplasm of skin: Secondary | ICD-10-CM | POA: Insufficient documentation

## 2024-05-29 DIAGNOSIS — E1140 Type 2 diabetes mellitus with diabetic neuropathy, unspecified: Secondary | ICD-10-CM | POA: Insufficient documentation

## 2024-05-29 DIAGNOSIS — M1711 Unilateral primary osteoarthritis, right knee: Secondary | ICD-10-CM | POA: Insufficient documentation

## 2024-05-29 NOTE — Assessment & Plan Note (Addendum)
 Follow-up with dermatologist scheduled. - Continue follow-up with dermatologist as scheduled.

## 2024-05-29 NOTE — Assessment & Plan Note (Addendum)
 Significant pain and weakness. Physical therapy recommended. - Initiated physical therapy by orthopedics for right knee strengthening.

## 2024-05-29 NOTE — Assessment & Plan Note (Deleted)
 Severe right great toe pain persists. Gabapentin  causes significant side effects. - Reduced gabapentin  dose to 100 mg twice daily.

## 2024-06-05 ENCOUNTER — Other Ambulatory Visit (HOSPITAL_COMMUNITY): Payer: Self-pay

## 2024-06-19 ENCOUNTER — Other Ambulatory Visit (HOSPITAL_COMMUNITY): Payer: Self-pay

## 2024-07-09 ENCOUNTER — Ambulatory Visit

## 2024-07-09 VITALS — BP 128/66 | Ht 67.0 in | Wt 189.0 lb

## 2024-07-09 DIAGNOSIS — Z0000 Encounter for general adult medical examination without abnormal findings: Secondary | ICD-10-CM

## 2024-07-09 NOTE — Progress Notes (Signed)
 " I connected with  Carol [name] on 07/09/24 by a audio enabled telemedicine application and verified that I am speaking with the correct person using two identifiers.  Patient Location: Home  Provider Location: Office/Clinic  Persons Participating in Visit: Patient.  I discussed the limitations of evaluation and management by telemedicine. The patient expressed understanding and agreed to proceed.  Vital Signs: Because this visit was a virtual/telehealth visit, some criteria may be missing or patient reported. Any vitals not documented were not able to be obtained and vitals that have been documented are patient reported.  Chief Complaint  Patient presents with   Medicare Wellness     Subjective:   Carol [name] is a 89 y.o. female who presents for a Medicare Annual Wellness Visit.  Visit info / Clinical Intake: Medicare Wellness Visit Type:: Subsequent Annual Wellness Visit Persons participating in visit and providing information:: patient Medicare Wellness Visit Mode:: Telephone If telephone:: video declined Since this visit was completed virtually, some vitals may be partially provided or unavailable. Missing vitals are due to the limitations of the virtual format.: Documented vitals are patient reported If Telephone or Video please confirm:: I connected with patient using audio/video enable telemedicine. I verified patient identity with two identifiers, discussed telehealth limitations, and patient agreed to proceed. Patient Location:: home Provider Location:: office clinic Interpreter Needed?: No Pre-visit prep was completed: yes Living arrangements:: (!) lives alone Patient's Overall Health Status Rating: very good Typical amount of pain: some Does pain affect daily life?: no Are you currently prescribed opioids?: no  Dietary Habits and Nutritional Risks How many meals a day?: 3 Eats fruit and vegetables daily?: yes Most meals are obtained by: preparing own  meals In the last 2 weeks, have you had any of the following?: none Diabetic:: (!) yes Any non-healing wounds?: no How often do you check your BS?: continuous glucose monitor Would you like to be referred to a Nutritionist or for Diabetic Management? : no  Functional Status Activities of Daily Living (to include ambulation/medication): Independent Ambulation: Independent with device- listed below Home Assistive Devices/Equipment: Johna Finder (specify Type) Medication Administration: Independent Home Management (perform basic housework or laundry): Independent Manage your own finances?: yes Primary transportation is: driving Concerns about vision?: no *vision screening is required for WTM* Concerns about hearing?: (!) yes Uses hearing aids?: (!) yes Hear whispered voice?: yes  Fall Screening Falls in the past year?: 1 Number of falls in past year: 1 Was there an injury with Fall?: 1 Fall Risk Category Calculator: 3 Patient Fall Risk Level: High Fall Risk  Fall Risk Patient at Risk for Falls Due to: History of fall(s); Impaired balance/gait; Impaired mobility Fall risk Follow up: Falls evaluation completed  Home and Transportation Safety: All rugs have non-skid backing?: N/A, no rugs All stairs or steps have railings?: yes Grab bars in the bathtub or shower?: yes Have non-skid surface in bathtub or shower?: yes Good home lighting?: yes Regular seat belt use?: yes Hospital stays in the last year:: (!) yes Reason: fall  Cognitive Assessment Difficulty concentrating, remembering, or making decisions? : no Will 6CIT or Mini Cog be Completed: yes What year is it?: 0 points What month is it?: 0 points Give patient an address phrase to remember (5 components): remember words apple , table , penny About what time is it?: 0 points Count backwards from 20 to 1: 0 points Say the months of the year in reverse: 0 points Repeat the address phrase from earlier: 0  points 6 CIT  Score: 0 points  Advance Directives (For Healthcare) Does Patient Have a Medical Advance Directive?: Yes Does patient want to make changes to medical advance directive?: No - Patient declined Type of Advance Directive: Healthcare Power of [place]; Living will; Out of facility DNR (pink MOST or yellow form) Copy of Healthcare Power of Attorney in Chart?: No - copy requested Copy of Living Will in Chart?: No - copy requested Out of facility DNR (pink MOST or yellow form) in Chart? (Ambulatory ONLY): No - copy requested  Reviewed/Updated  Reviewed/Updated: Reviewed All (Medical, Surgical, Family, Medications, Allergies, Care Teams, Patient Goals)    Allergies (verified) Iodinated contrast media, Atorvastatin , Corticosteroids, Hydrocodone , Other, Oxycodone , Simvastatin, Cortizone-10 [hydrocortisone], and Ozempic  (0.25 or 0.5 mg-dose) [semaglutide (0.25 or 0.5mg -dos)]   Current Medications (verified) Outpatient Encounter Medications as of 07/09/2024  Medication Sig   acetaminophen  (TYLENOL ) 500 MG tablet Take 2 tablets (1,000 mg total) by mouth every 8 (eight) hours as needed for mild pain (pain score 1-3) or moderate pain (pain score 4-6).   albuterol  (VENTOLIN  HFA) 108 (90 Base) MCG/ACT inhaler Inhale 2 puffs into the lungs every 6 (six) hours as needed for wheezing or shortness of breath.   allopurinol  (ZYLOPRIM ) 100 MG tablet Take 1 tablet (100 mg total) by mouth every other day.   Biotin  5000 MCG CAPS Take 5,000 mcg by mouth every morning.   Blood Glucose Monitoring Suppl (TRUE METRIX METER) DEVI Use to test blood sugars once daily. ICD: E11.65   carvedilol  (COREG ) 6.25 MG tablet Take 1 tablet (6.25 mg total) by mouth 2 (two) times daily with a meal.   clopidogrel  (PLAVIX ) 75 MG tablet Take 1 tablet (75 mg total) by mouth daily.   colchicine  0.6 MG tablet Every 3 days as needed for gout flare up.   Continuous Glucose Receiver (FREESTYLE LIBRE 2 READER) DEVI Use as directed   Continuous  Glucose Sensor (FREESTYLE LIBRE 2 SENSOR) MISC Use every 14 (fourteen) days.   ezetimibe  (ZETIA ) 10 MG tablet Take 1 tablet (10 mg total) by mouth daily.   famotidine  (PEPCID ) 10 MG tablet Take 10 mg by mouth 2 (two) times daily as needed for heartburn or indigestion.    furosemide  (LASIX ) 80 MG tablet Take 80 mg by mouth daily.   gabapentin  (NEURONTIN ) 100 MG capsule Take 1 capsule (100 mg total) by mouth 2 (two) times daily.   glucose blood (TRUE METRIX BLOOD GLUCOSE TEST) test strip Use as instructed   insulin  NPH-regular Human (70-30) 100 UNIT/ML injection Inject 25-32 Units into the skin daily.   isosorbide  mononitrate (IMDUR ) 30 MG 24 hr tablet Take 1 tablet (30 mg total) by mouth daily.   levothyroxine  (SYNTHROID ) 75 MCG tablet Take 1 tablet (75 mcg total) by mouth daily before breakfast.   linagliptin  (TRADJENTA ) 5 MG TABS tablet Take 1 tablet (5 mg total) by mouth daily.   nitroGLYCERIN  (NITROSTAT ) 0.4 MG SL tablet Place 1 tablet (0.4 mg total) under the tongue every 5 (five) minutes as needed for chest pain.   ranolazine  (RANEXA ) 500 MG 12 hr tablet Take 1 tablet (500 mg total) by mouth 2 (two) times daily.   traMADol  (ULTRAM ) 50 MG tablet Take 1 tablet (50 mg total) by mouth daily as needed for severe pain (pain score 7-10).   TRUEplus Lancets 33G MISC Use to test blood sugar once daily. ICD: E11.65   No facility-administered encounter medications on file as of 07/09/2024.    History: Past Medical History:  Diagnosis  Date   Anemia    a. Noted on labs 02/2014 possibly procedurally related.   Asthma    CAD S/P percutaneous coronary angioplasty 01/22/2014   A. (12/2013): POBA - OM2 99% (very tortuous segment) - reduced ~ 50% & TIMI 3 flow, 80-90% stenosis in mid PDA, Irregularities <50% in mRCA, pLAD and prox LCx; b. 02/17/14: Moderate sized fixed defect in Lateral wall --> cath with OM2 restenosis & otherwise stable --> Xience Alpine DES x 2 (2.25 mm x 12 & 8 mm). c. NSTEMI 02/2014 s/p  PTCA/balloon angioplasty only to small caliber PDA.   Carotid stenosis    Carotid US  11/17: L 1-39; FU prn   Chronic systolic heart failure (HCC)    a. ICM b. ECHO (01/2014): EF 25-30%, grade I DD, trivial MR. c. EF by Myoview  02/17/14 = ~53%. d. EF by cath 02/24/14 - improved to 50-55%.   CKD (chronic kidney disease), stage III (HCC)    Contrast media allergy    Environmental allergies    GERD (gastroesophageal reflux disease)    Gout, unspecified    HOH (hard of hearing)    Hyperlipidemia    a. H/o intolerance to Zocor, Lipitor . Had muscle aches on higher dose Crestor .   Hypertension    Hypothyroidism    LBBB (left bundle branch block)    a. Transient during 02/2014 admission.   NSTEMI (non-ST elevated myocardial infarction) (HCC) 2014; 12/2013, 01/2014   QT prolongation    a. Noted on EKG 02/2014.   Sinus bradycardia    a. HR 40s-50s during 02/2014 admission, limiting BB dose.   TIA (transient ischemic attack) 1990's   a. TIA vs stroke 1990's mild - sounds like a TIA as she was told she had stroke symptoms but negative scans. Denies residual effects.   Type II diabetes mellitus [facility])    Past Surgical History:  Procedure Laterality Date   ABDOMINAL HYSTERECTOMY     complete   AMPUTATION TOE Right 02/25/2021   hammer toe.   APPENDECTOMY  1959   CARDIAC CATHETERIZATION  02/24/2014   Procedure: CORONARY BALLOON ANGIOPLASTY;  Surgeon: Lonni JONETTA Cash, MD;  Location: [facility] CATH LAB;  Service: Cardiovascular;;   CARDIAC CATHETERIZATION N/A 04/20/2015   Procedure: Left Heart Cath and Coronary Angiography;  Surgeon: Victory LELON Sharps, MD;  Location: [facility] INVASIVE CV LAB;  Service: Cardiovascular;  Laterality: N/A;   CATARACT EXTRACTION W/ INTRAOCULAR LENS  IMPLANT, BILATERAL Bilateral 2013   CESAREAN SECTION  1959; 1960   CHOLECYSTECTOMY  1989   CORONARY ANGIOPLASTY  12/2013   POBA - OM2   CORONARY ARTERY BYPASS GRAFT N/A 04/25/2015   Procedure: CORONARY ARTERY BYPASS GRAFTING (CABG) x  four,  using left internal mammary artery and right leg greater saphenous vein harvested endoscopically;  Surgeon: Dorise MARLA Fellers, MD;  Location: [facility] OR;  Service: Open Heart Surgery;  Laterality: N/A;   JOINT REPLACEMENT Right 10 years ago    right knee (TKR)   LEFT HEART CATHETERIZATION WITH CORONARY ANGIOGRAM N/A 01/22/2014   Procedure: LEFT HEART CATHETERIZATION WITH CORONARY ANGIOGRAM;  Surgeon: Victory LELON Sharps DOUGLAS, MD;  Location: [facility] CATH LAB;  Service: Cardiovascular;  Laterality: N/A;   LEFT HEART CATHETERIZATION WITH CORONARY ANGIOGRAM N/A 02/17/2014   Procedure: LEFT HEART CATHETERIZATION WITH CORONARY ANGIOGRAM;  Surgeon: Lonni JONETTA Cash, MD;  Location: [facility] CATH LAB;  Service: Cardiovascular;  Laterality: N/A;   LEFT HEART CATHETERIZATION WITH CORONARY ANGIOGRAM N/A 02/24/2014   Procedure: LEFT HEART CATHETERIZATION WITH CORONARY ANGIOGRAM;  Surgeon: Lonni JONETTA Cash, MD;  Location: [facility] CATH LAB;  Service: Cardiovascular;  Laterality: N/A;   LUMBAR LAMINECTOMY/DECOMPRESSION MICRODISCECTOMY Left 12/22/2014   Procedure: CENTRAL DECOMPRESSIVE LUMBAR, AND LAMINECTOMY L5-S1,  S1-S2 FOR SPINAL STENOSIS FORAMINOTOMY L5, S1, S2 ROOTS ON LEFT FOR FORAMINAL STENOSIS, AND DECOMPRESSION L5-S1, S1-S2 ACCORDING TO LABELING OF [facility] X-RAYS;  Surgeon: Tanda Heading, MD;  Location: [facility] ORS;  Service: Orthopedics;  Laterality: Left;   PERCUTANEOUS CORONARY STENT INTERVENTION (PCI-S)  02/17/2014   For restenosis of OM2 - PCI with Xience Apline DES 2.25 mm x 12 mm & 2.25 mm x 8 mm overlapping   REDUCTION MAMMAPLASTY Bilateral 1990's   SHOULDER OPEN ROTATOR CUFF REPAIR Bilateral 1980's - 1990's   TEE WITHOUT CARDIOVERSION N/A 04/25/2015   Procedure: TRANSESOPHAGEAL ECHOCARDIOGRAM (TEE);  Surgeon: Dorise MARLA Fellers, MD;  Location: [facility] OR;  Service: Open Heart Surgery;  Laterality: N/A;   TOTAL HIP ARTHROPLASTY Left 11/29/2023   TOTAL KNEE ARTHROPLASTY Right 2009   TOTAL KNEE ARTHROPLASTY  Left 04/21/2018   TKR;  Surgeon: Rubie Kemps, MD;  Location: [facility] ORS;  Service: Orthopedics;  Laterality: Left;  Adductor Block   Family History  Problem Relation Age of Onset   Hypertension Mother    Diabetes Mother    Heart attack Mother 41   Heart disease Father    Emphysema Father    Diabetes Brother    Heart disease Brother    Cancer Sister    Stroke Neg Hx    Social History   Occupational History   Occupation: Retired  Tobacco Use   Smoking status: Never   Smokeless tobacco: Never  Vaping Use   Vaping status: Never Used  Substance and Sexual Activity   Alcohol use: No   Drug use: No   Sexual activity: Never   Tobacco Counseling Counseling given: Not Answered  SDOH Screenings   Food Insecurity: No Food Insecurity (07/09/2024)  Housing: Low Risk (07/09/2024)  Transportation Needs: No Transportation Needs (07/09/2024)  Utilities: Not At Risk (07/05/2023)  Alcohol Screen: Low Risk (10/22/2022)  Depression (PHQ2-9): Low Risk (07/09/2024)  Financial Resource Strain: Low Risk (10/22/2022)  Physical Activity: Sufficiently Active (07/09/2024)  Social Connections: Moderately Isolated (07/09/2024)  Stress: No Stress Concern Present (07/09/2024)  Tobacco Use: Low Risk (07/09/2024)  Health Literacy: Adequate Health Literacy (07/09/2024)   See flowsheets for full screening details  Depression Screen PHQ 2 & 9 Depression Scale- Over the past 2 weeks, how often have you been bothered by any of the following problems? Little interest or pleasure in doing things: 0 Feeling down, depressed, or hopeless (PHQ Adolescent also includes...irritable): 0 PHQ-2 Total Score: 0 Trouble falling or staying asleep, or sleeping too much: 0 Feeling tired or having little energy: 0 Poor appetite or overeating (PHQ Adolescent also includes...weight loss): 0 Feeling bad about yourself - or that you are a failure or have let yourself or your family down: 0 Trouble concentrating on things, such as  reading the newspaper or watching television (PHQ Adolescent also includes...like school work): 0 Moving or speaking so slowly that other people could have noticed. Or the opposite - being so fidgety or restless that you have been moving around a lot more than usual: 0 Thoughts that you would be better off dead, or of hurting yourself in some way: 0 PHQ-9 Total Score: 0 If you checked off any problems, how difficult have these problems made it for you to do your work, take care of things at home, or  get along with other people?: Not difficult at all  Depression Treatment Depression Interventions/Treatment : EYV7-0 Score <4 Follow-up Not Indicated     Goals Addressed               This Visit's Progress     Patient Stated (pt-stated)        Patient states she to get her knee better after therapy             Objective:    Today's Vitals   07/09/24 0814  BP: 128/66  Weight: 189 lb (85.7 kg)  Height: 5' 7 (1.702 m)   Body mass index is 29.6 kg/m.  Hearing/Vision screen Hearing Screening - Comments:: Wears hearing aids  Vision Screening - Comments:: Patient wears readers .  Immunizations and Health Maintenance Health Maintenance  Topic Date Due   COVID-19 Vaccine (1) Never done   Zoster Vaccines- Shingrix (1 of 2) 03/06/1955   Influenza Vaccine  09/22/2024 (Originally 01/24/2024)   Medicare Annual Wellness (AWV)  09/28/2024   HEMOGLOBIN A1C  11/23/2024   FOOT EXAM  05/25/2025   OPHTHALMOLOGY EXAM  05/28/2025   DTaP/Tdap/Td (2 - Td or Tdap) 05/14/2031   Pneumococcal Vaccine: 50+ Years  Completed   Bone Density Scan  Completed   Meningococcal B Vaccine  Aged Out        Assessment/Plan:  This is a routine wellness examination for [place].  Patient Care Team: Sherre Clapper, MD as PCP - General (Internal Medicine) Inocencio Soyla Lunger, MD as Consulting Physician (Cardiology) [facility] Pandora Cadet, [facility] as Pharmacist (Pharmacist) Rubie Kemps, MD as  Consulting Physician (Orthopedic Surgery) Madireddy, Alean SAUNDERS, MD as Consulting Physician (Cardiology)  I have personally reviewed and noted the following in the patients chart:   Medical and social history Use of alcohol, tobacco or illicit drugs  Current medications and supplements including opioid prescriptions. Functional ability and status Nutritional status Physical activity Advanced directives List of other physicians Hospitalizations, surgeries, and ER visits in previous 12 months Vitals Screenings to include cognitive, depression, and falls Referrals and appointments  No orders of the defined types were placed in this encounter.  In addition, I have reviewed and discussed with patient certain preventive protocols, quality metrics, and best practice recommendations. A written personalized care plan for preventive services as well as general preventive health recommendations were provided to patient.   Lyle MARLA Right, [place]   07/09/2024   No follow-ups on file.  After Visit Summary: (MyChart) Due to this being a telephonic visit, the after visit summary with patients personalized plan was offered to patient via MyChart   No voiced or noted concerns at this time Patient advised to keep follow-up appointment with PCP (08/25/2024) Vaccines not given: covid and shingles vaccine declined today  "

## 2024-07-09 NOTE — Patient Instructions (Signed)
 Carol [name],  Thank you for taking the time for your Medicare Wellness Visit. I appreciate your continued commitment to your health goals. Please review the care plan we discussed, and feel free to reach out if I can assist you further.  Please note that Annual Wellness Visits do not include a physical exam. Some assessments may be limited, especially if the visit was conducted virtually. If needed, we may recommend an in-person follow-up with your provider.  Ongoing Care Seeing your primary care provider every 3 to 6 months helps us  monitor your health and provide consistent, personalized care.   Referrals If a referral was made during today's visit and you haven't received any updates within two weeks, please contact the referred provider directly to check on the status.  Recommended Screenings:  Health Maintenance  Topic Date Due   COVID-19 Vaccine (1) Never done   Zoster (Shingles) Vaccine (1 of 2) 03/06/1955   Flu Shot  09/22/2024*   Medicare Annual Wellness Visit  09/28/2024   Hemoglobin A1C  11/23/2024   Complete foot exam   05/25/2025   Eye exam for diabetics  05/28/2025   DTaP/Tdap/Td vaccine (2 - Td or Tdap) 05/14/2031   Pneumococcal Vaccine for age over 38  Completed   Osteoporosis screening with Bone Density Scan  Completed   Meningitis B Vaccine  Aged Out  *Topic was postponed. The date shown is not the original due date.       07/04/2023    2:39 PM  Advanced Directives  Does Patient Have a Medical Advance Directive? No  Would patient like information on creating a medical advance directive? No - Patient declined    Vision: Annual vision screenings are recommended for early detection of glaucoma, cataracts, and diabetic retinopathy. These exams can also reveal signs of chronic conditions such as diabetes and high blood pressure.  Dental: Annual dental screenings help detect early signs of oral cancer, gum disease, and other conditions linked to overall health,  including heart disease and diabetes.  Please see the attached documents for additional preventive care recommendations.

## 2024-07-16 ENCOUNTER — Other Ambulatory Visit (HOSPITAL_COMMUNITY): Payer: Self-pay

## 2024-07-16 ENCOUNTER — Other Ambulatory Visit: Payer: Self-pay

## 2024-07-17 ENCOUNTER — Other Ambulatory Visit: Payer: Self-pay

## 2024-07-17 DIAGNOSIS — E1122 Type 2 diabetes mellitus with diabetic chronic kidney disease: Secondary | ICD-10-CM

## 2024-07-17 DIAGNOSIS — K219 Gastro-esophageal reflux disease without esophagitis: Secondary | ICD-10-CM

## 2024-07-17 MED ORDER — INSULIN NPH ISOPHANE & REGULAR (70-30) 100 UNIT/ML ~~LOC~~ SUSP
25.0000 [IU] | Freq: Every day | SUBCUTANEOUS | 2 refills | Status: AC
  Filled 2024-07-17: qty 10, 28d supply, fill #0

## 2024-07-17 MED ORDER — PANTOPRAZOLE SODIUM 40 MG PO TBEC
40.0000 mg | DELAYED_RELEASE_TABLET | Freq: Every day | ORAL | 1 refills | Status: AC
  Filled 2024-07-17: qty 90, 90d supply, fill #0

## 2024-07-20 ENCOUNTER — Other Ambulatory Visit (HOSPITAL_COMMUNITY): Payer: Self-pay

## 2024-07-21 ENCOUNTER — Other Ambulatory Visit: Payer: Self-pay

## 2024-07-23 ENCOUNTER — Other Ambulatory Visit (HOSPITAL_COMMUNITY): Payer: Self-pay

## 2024-07-23 ENCOUNTER — Other Ambulatory Visit: Payer: Self-pay

## 2024-07-27 ENCOUNTER — Other Ambulatory Visit (HOSPITAL_COMMUNITY): Payer: Self-pay

## 2024-08-25 ENCOUNTER — Ambulatory Visit: Admitting: Family Medicine
